# Patient Record
Sex: Male | Born: 1955 | Race: White | Hispanic: No | Marital: Single | Smoking: Current every day smoker
Health system: Southern US, Community
[De-identification: ages and names within clinical notes are randomized; demographics above are authoritative.]

## PROBLEM LIST (undated history)

## (undated) DIAGNOSIS — M1990 Unspecified osteoarthritis, unspecified site: Secondary | ICD-10-CM

## (undated) DIAGNOSIS — J439 Emphysema, unspecified: Secondary | ICD-10-CM

## (undated) DIAGNOSIS — E785 Hyperlipidemia, unspecified: Secondary | ICD-10-CM

## (undated) DIAGNOSIS — Z982 Presence of cerebrospinal fluid drainage device: Secondary | ICD-10-CM

## (undated) DIAGNOSIS — N184 Chronic kidney disease, stage 4 (severe): Secondary | ICD-10-CM

## (undated) DIAGNOSIS — G40909 Epilepsy, unspecified, not intractable, without status epilepticus: Secondary | ICD-10-CM

## (undated) DIAGNOSIS — R251 Tremor, unspecified: Secondary | ICD-10-CM

## (undated) DIAGNOSIS — G039 Meningitis, unspecified: Secondary | ICD-10-CM

## (undated) DIAGNOSIS — N289 Disorder of kidney and ureter, unspecified: Secondary | ICD-10-CM

## (undated) DIAGNOSIS — M48061 Spinal stenosis, lumbar region without neurogenic claudication: Secondary | ICD-10-CM

## (undated) DIAGNOSIS — R569 Unspecified convulsions: Secondary | ICD-10-CM

## (undated) DIAGNOSIS — E119 Type 2 diabetes mellitus without complications: Secondary | ICD-10-CM

## (undated) DIAGNOSIS — F0280 Dementia in other diseases classified elsewhere without behavioral disturbance: Secondary | ICD-10-CM

## (undated) DIAGNOSIS — I10 Essential (primary) hypertension: Secondary | ICD-10-CM

## (undated) HISTORY — DX: Unspecified convulsions: R56.9

## (undated) HISTORY — PX: KNEE SURGERY: SHX244

## (undated) HISTORY — DX: Unspecified osteoarthritis, unspecified site: M19.90

## (undated) HISTORY — PX: CSF SHUNT: SHX92

## (undated) HISTORY — DX: Disorder of kidney and ureter, unspecified: N28.9

---

## 2003-08-03 ENCOUNTER — Other Ambulatory Visit: Payer: Self-pay

## 2003-08-16 ENCOUNTER — Inpatient Hospital Stay (HOSPITAL_COMMUNITY): Admission: EM | Admit: 2003-08-16 | Discharge: 2003-08-18 | Payer: Self-pay | Admitting: Psychiatry

## 2005-01-18 ENCOUNTER — Emergency Department: Payer: Self-pay | Admitting: Unknown Physician Specialty

## 2005-01-18 IMAGING — CR DG LUMBAR SPINE 2-3V
1 series · 3 of 3 positions shown · non-contrast
Comparison: none

REASON FOR EXAM: Status post injury
COMMENTS:

[Series 1: view not recorded · 0.17mm/px · 3 of 3 slices shown]
[im 1/3]
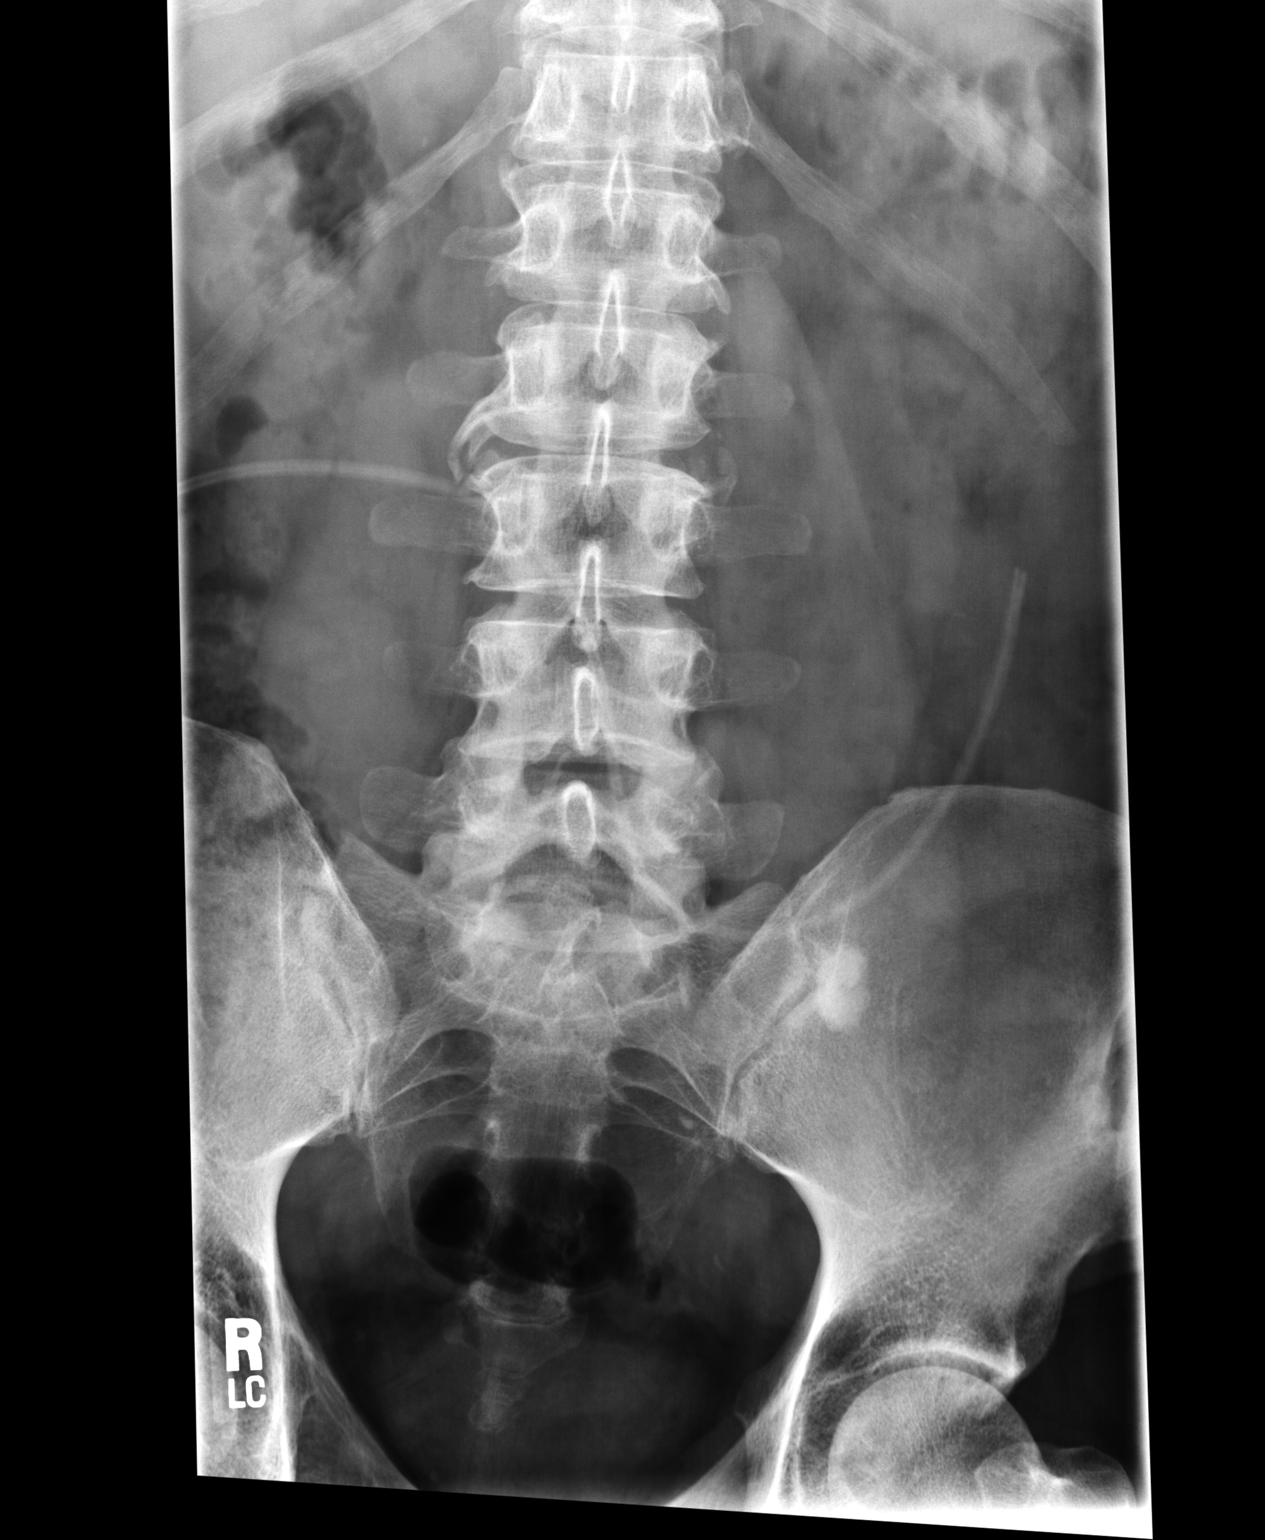
[im 2/3]
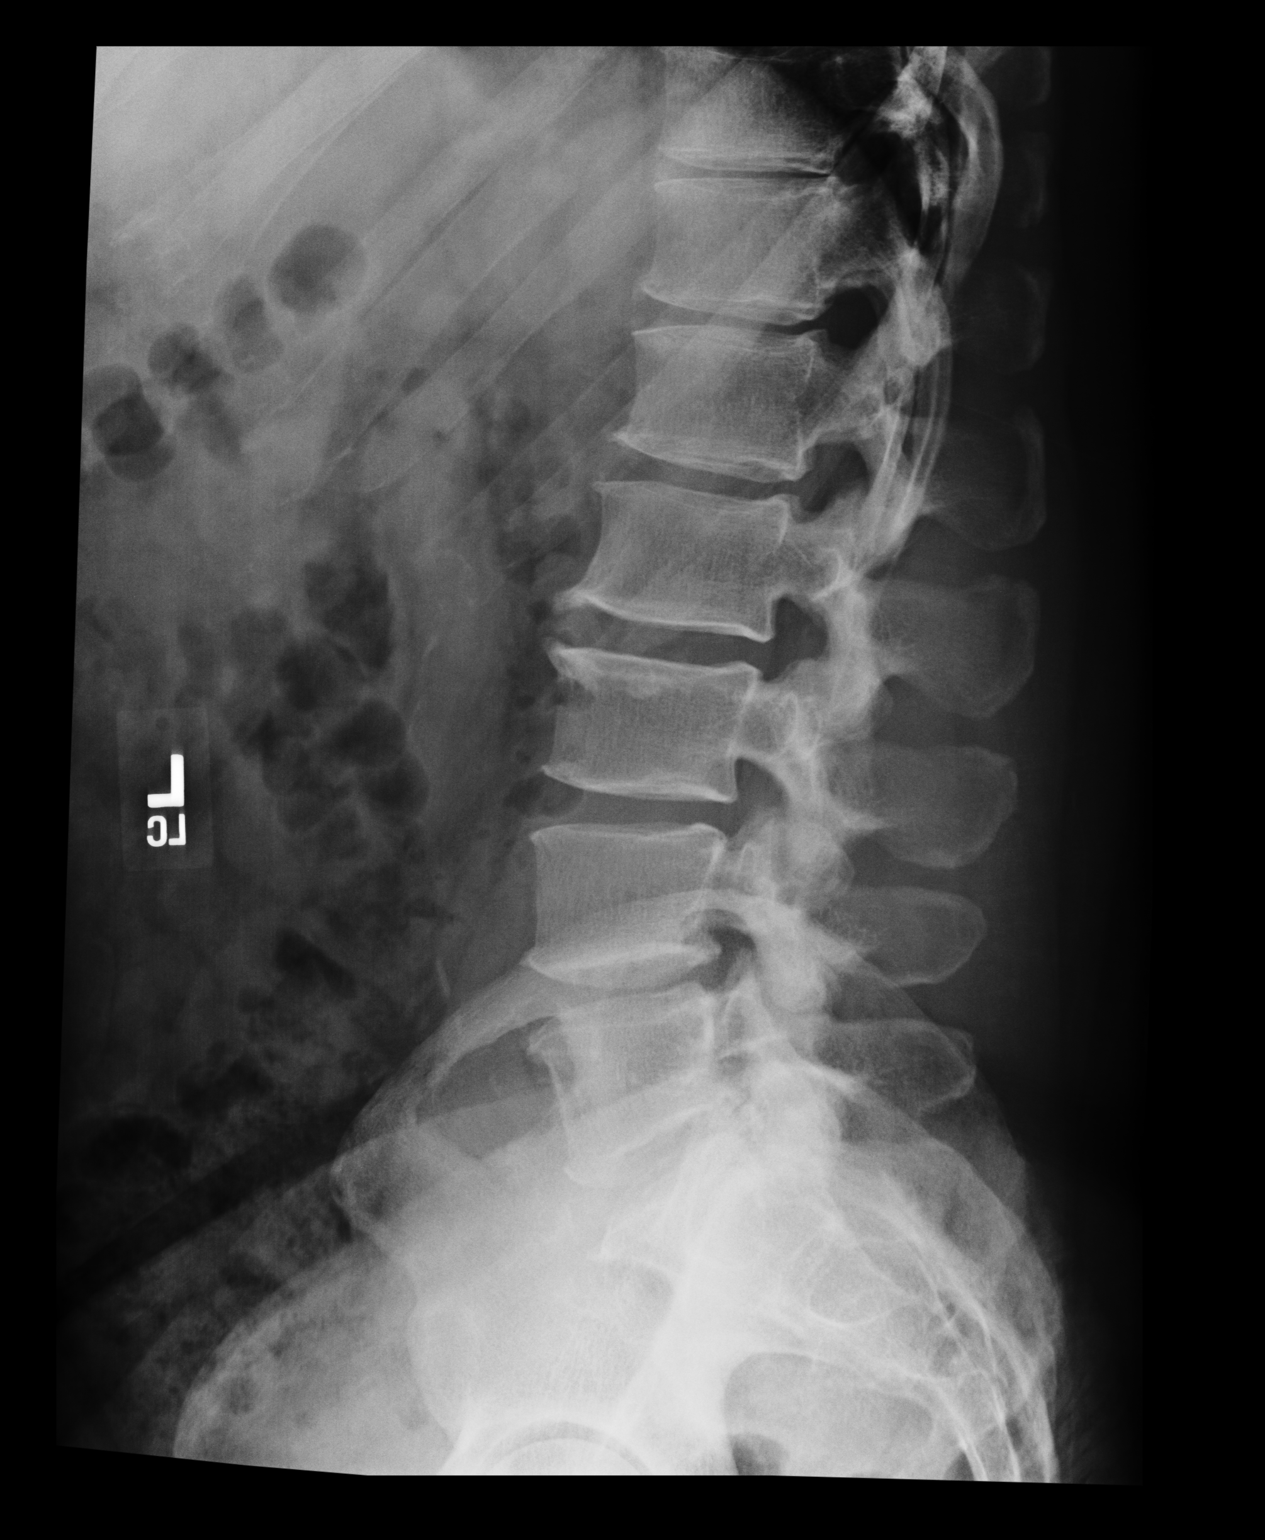
[im 3/3]
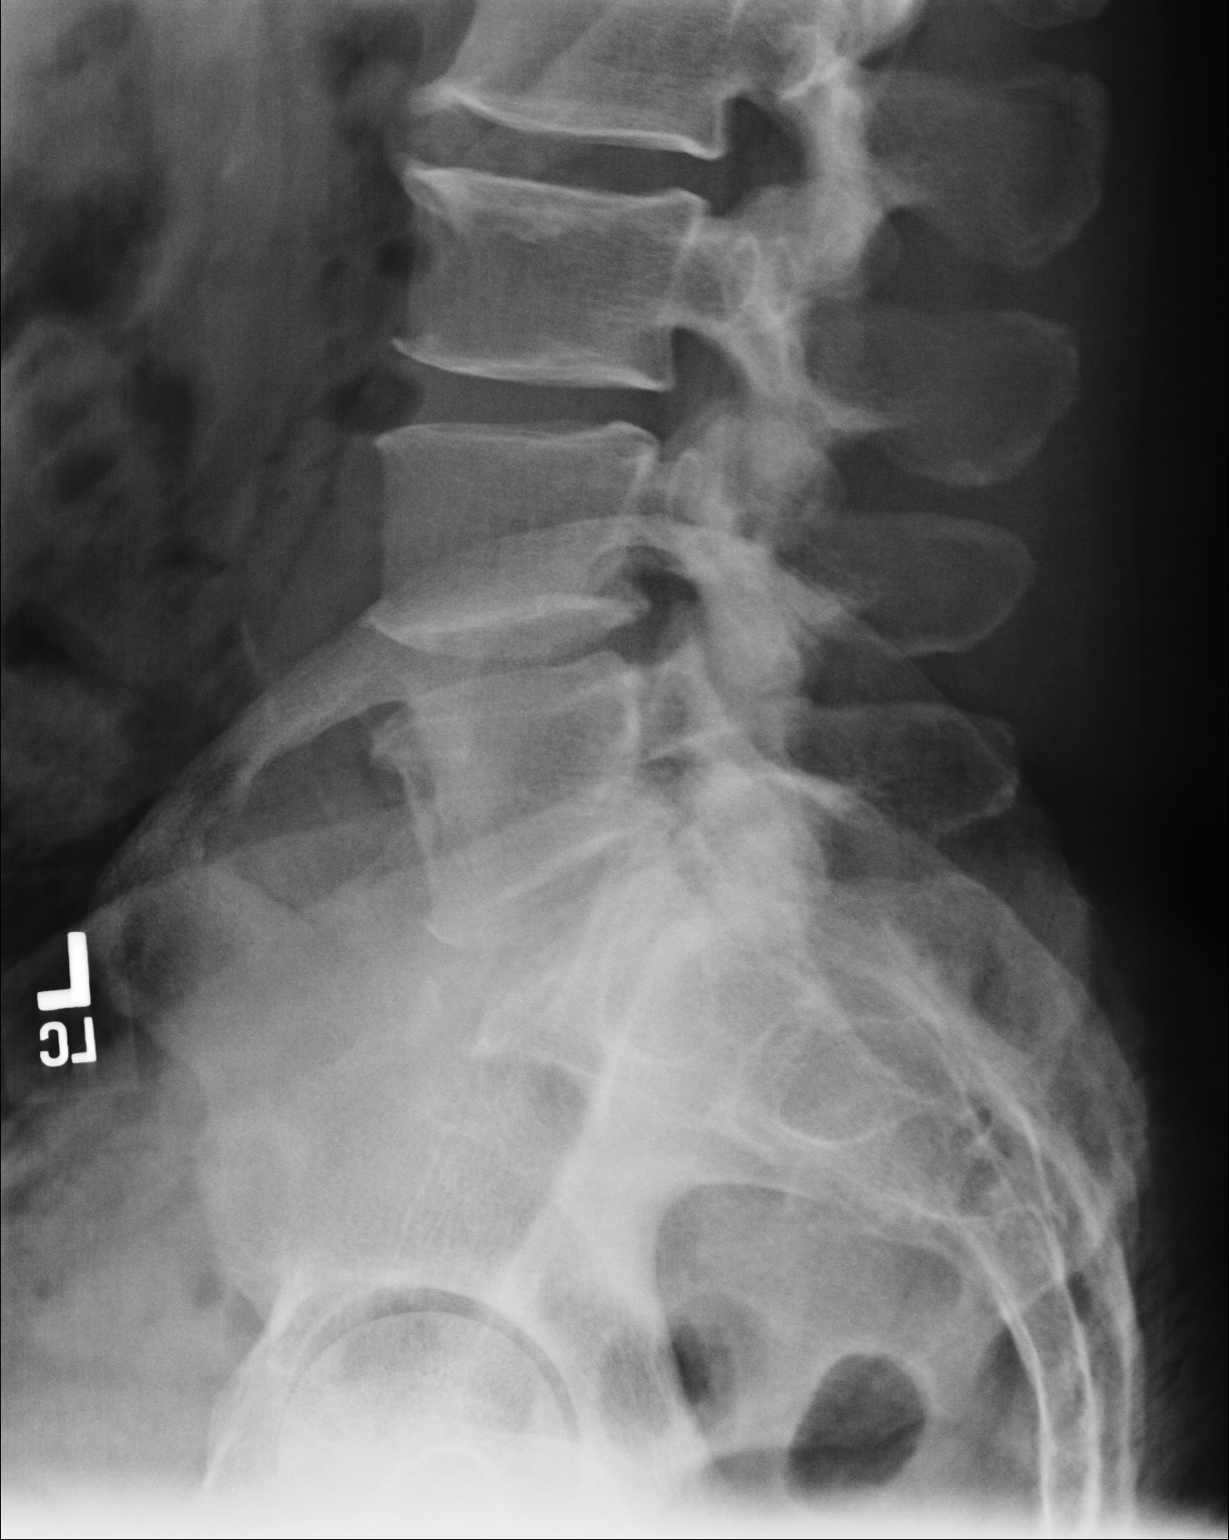

[3 of 3 positions shown; findings below may reference images not displayed]

PROCEDURE:     DXR - DXR LUMBAR SPINE AP AND LATERAL  - [DATE]  [DATE]

RESULT:     The lumbar vertebral bodies are preserved in height.  There is
endplate spurring at multiple levels with the largest spur seen laterally on
the RIGHT at the L2-3 level.  The pedicles and transverse processes are
intact.  No high-grade disk space narrowing is seen.  A focal area of
sclerosis medially in the LEFT iliac bone is most compatible with a bone
island.
IMPRESSION: I do not see evidence of an acute fracture or spondylolisthesis or
high-grade disk space narrowing.  There are degenerative changes present.
Further evaluation with MRI may be of value.

## 2006-07-06 ENCOUNTER — Emergency Department: Payer: Self-pay | Admitting: Emergency Medicine

## 2006-07-08 ENCOUNTER — Emergency Department: Payer: Self-pay | Admitting: Emergency Medicine

## 2006-07-08 IMAGING — CR DG CHEST 2V
1 series · 2 of 2 positions shown · non-contrast
Comparison: none

REASON FOR EXAM: Shortness of breath , cough and wheezes
COMMENTS:

[Series 1: view not recorded · 0.17mm/px · 2 of 2 slices shown]
[im 1/2]
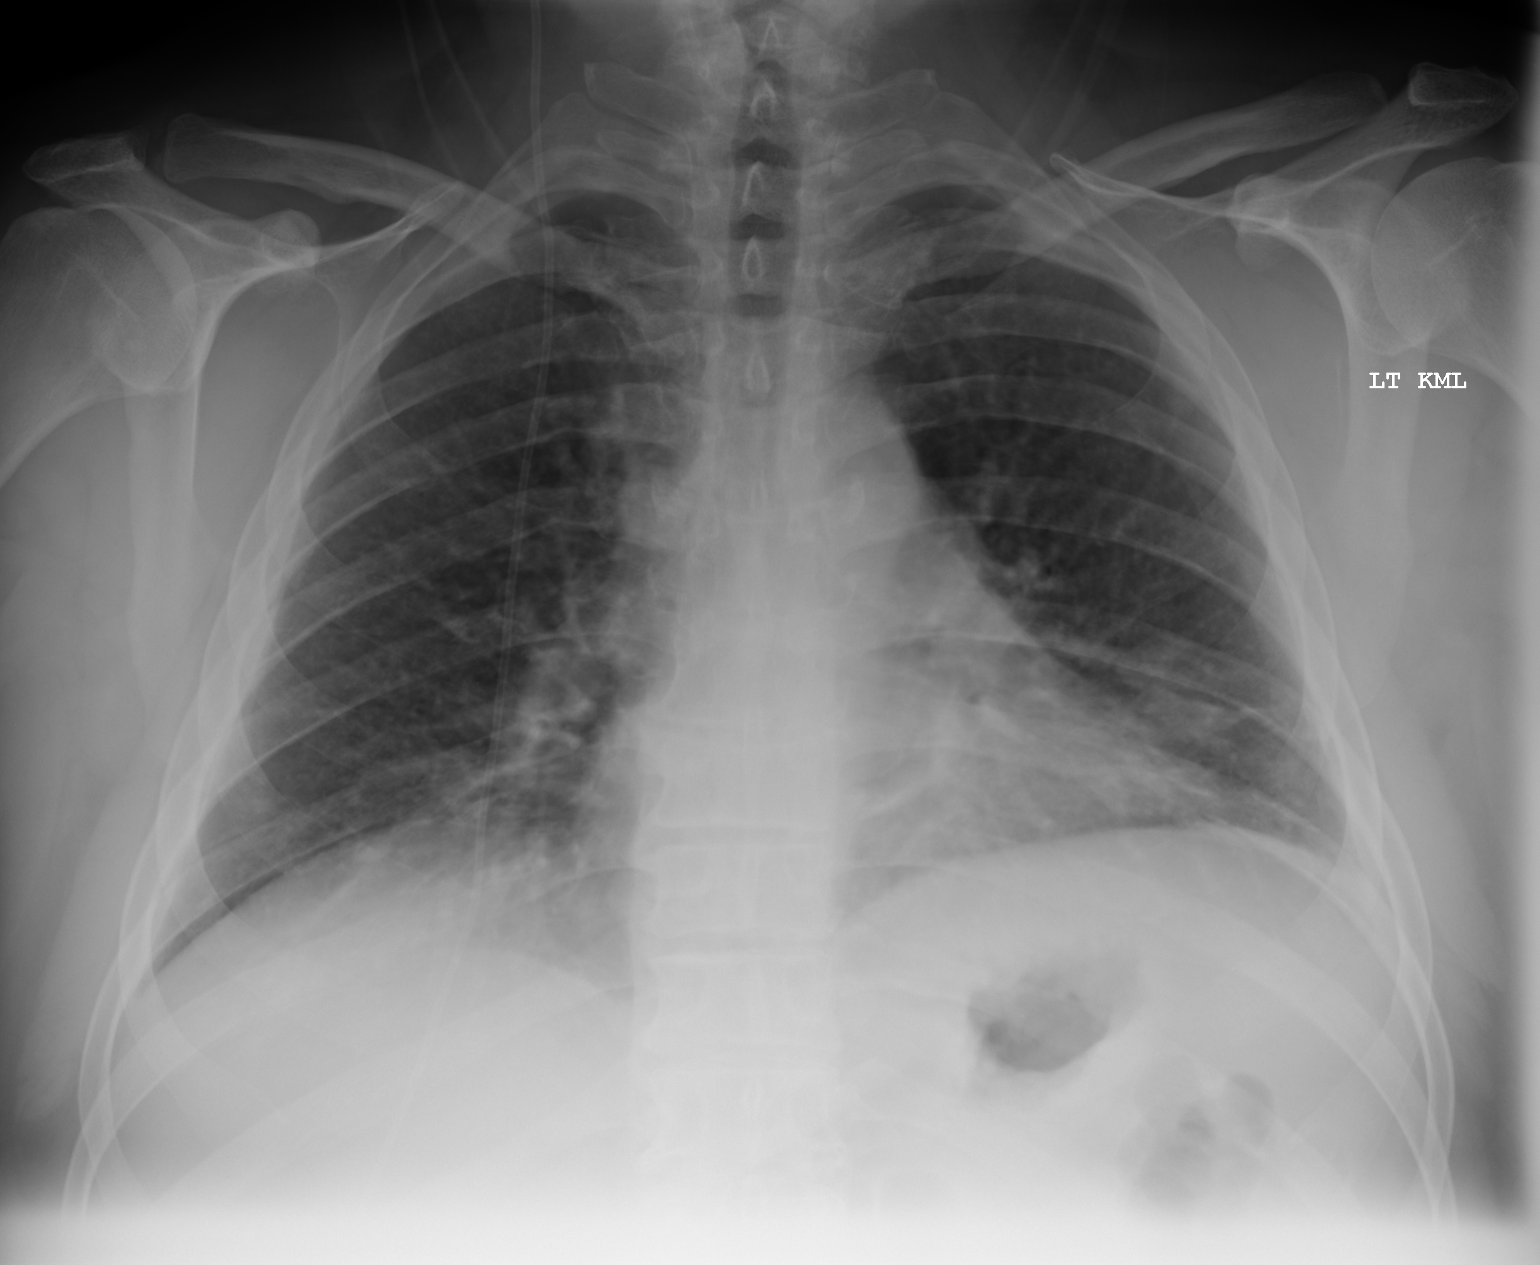
[im 2/2]
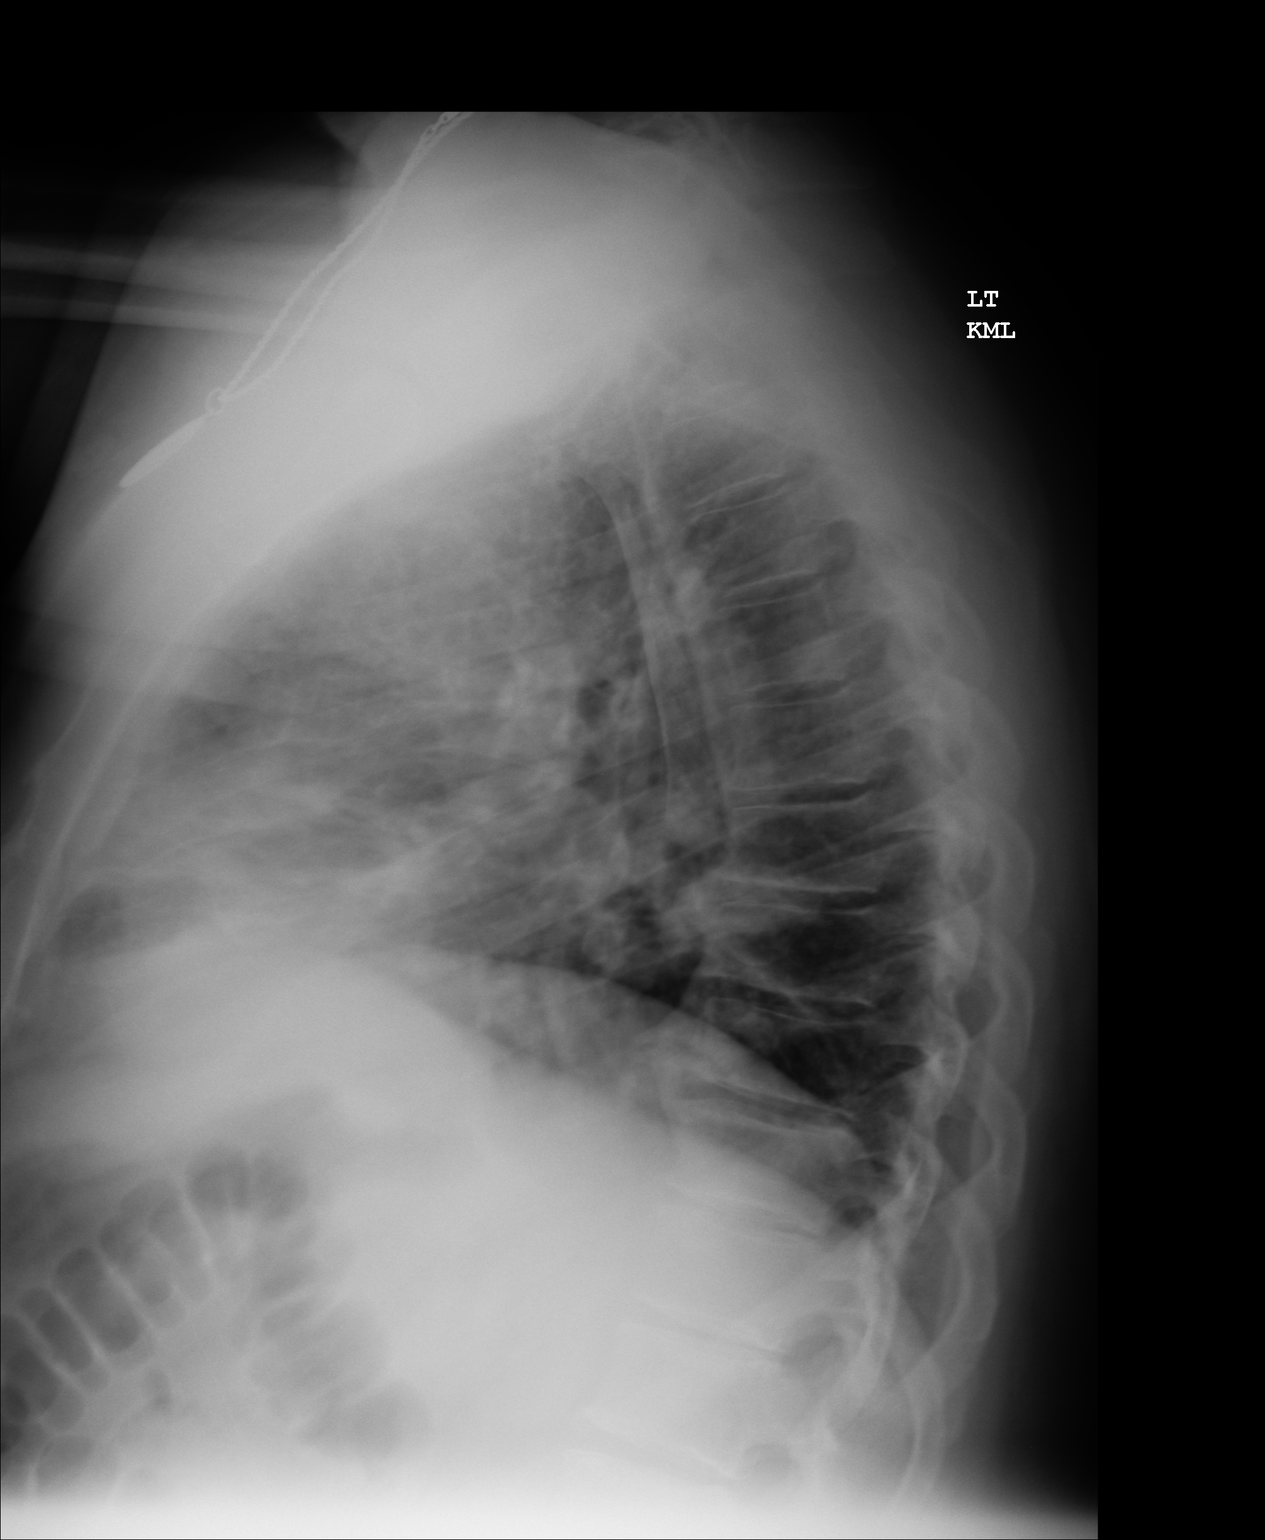

[2 of 2 positions shown; findings below may reference images not displayed]

PROCEDURE:     DXR - DXR CHEST PA (OR AP) AND LATERAL  - [DATE] [DATE]

RESULT:        The lungs are adequately inflated.  There are increased
perihilar lung markings and basilar lung markings bilaterally.  There is a
tube present consistent with ventriculoperitoneal shunt on the RIGHT.  The
cardiopericardial silhouette is not enlarged.  The pulmonary vascularity is
mildly prominent centrally.
IMPRESSION: There are findings which likely reflect atelectasis and/or developing
infiltrate at the lung bases and in the perihilar regions.  Follow-up films
are recommended to assure that there is response to therapy and that no
further infiltrates develop.

## 2008-05-13 ENCOUNTER — Ambulatory Visit: Payer: Self-pay | Admitting: Family Medicine

## 2008-05-20 ENCOUNTER — Ambulatory Visit: Payer: Self-pay | Admitting: Unknown Physician Specialty

## 2008-05-24 ENCOUNTER — Inpatient Hospital Stay: Payer: Self-pay | Admitting: Unknown Physician Specialty

## 2008-05-25 IMAGING — XA DG CHEST 1V
1 series · 1 of 1 positions shown · non-contrast
Comparison: none

REASON FOR EXAM: PICC line placement
COMMENTS:

PROCEDURE:     VAS - CHEST FRONTAL SINGLE VIEW  - [DATE] [DATE]
RESULT:     AP chest post PICC line placement is performed.
The PICC line is noted with its tip over the superior vena cava.

[Series 1: run · 1 of 1 slices shown]
[im 1/1]
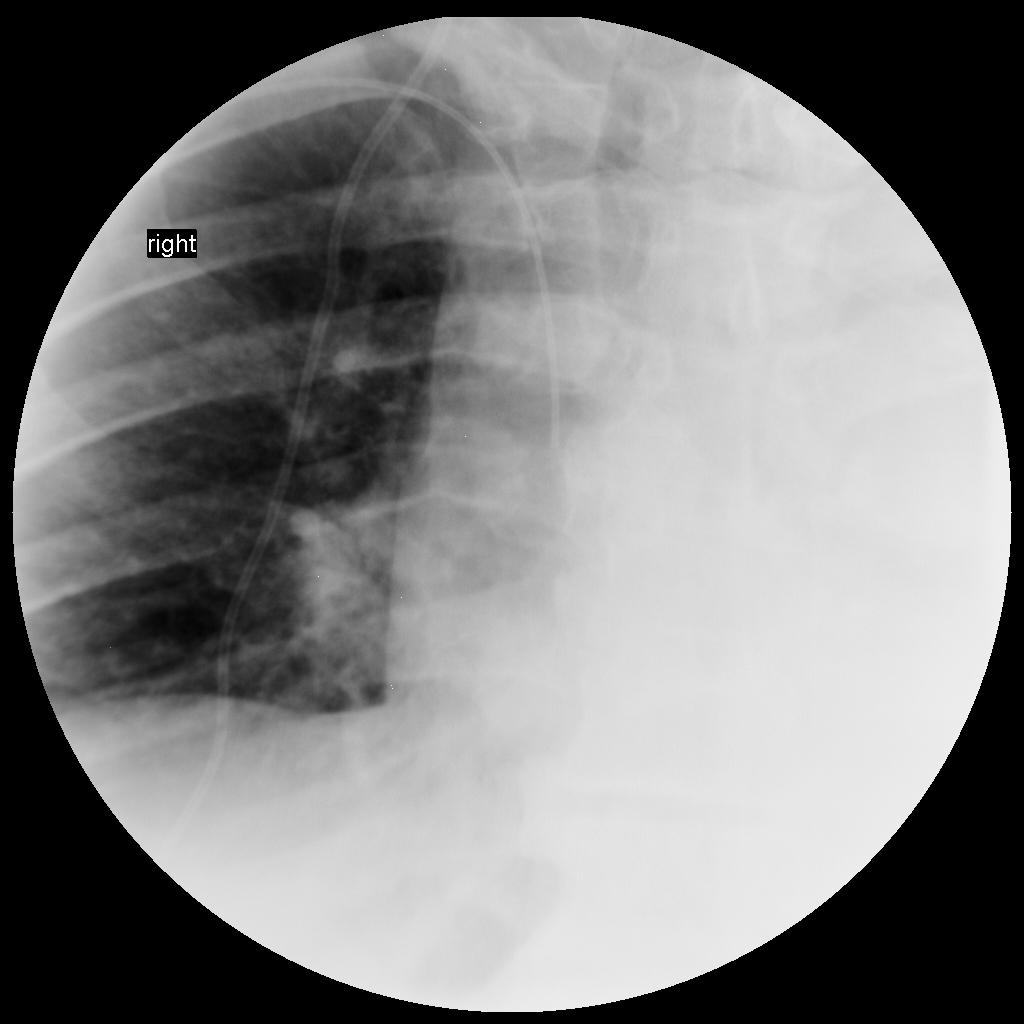

[1 of 1 positions shown; findings below may reference images not displayed]

IMPRESSION: Good anatomic positioning of PICC line.

## 2008-05-28 ENCOUNTER — Emergency Department: Payer: Self-pay | Admitting: Emergency Medicine

## 2008-06-05 ENCOUNTER — Ambulatory Visit: Payer: Self-pay | Admitting: Unknown Physician Specialty

## 2008-06-13 ENCOUNTER — Ambulatory Visit: Payer: Self-pay | Admitting: Unknown Physician Specialty

## 2008-06-20 ENCOUNTER — Ambulatory Visit: Payer: Self-pay | Admitting: Specialist

## 2008-06-24 ENCOUNTER — Emergency Department: Payer: Self-pay | Admitting: Emergency Medicine

## 2008-06-27 ENCOUNTER — Ambulatory Visit: Payer: Self-pay | Admitting: Unknown Physician Specialty

## 2012-02-07 ENCOUNTER — Encounter: Payer: Self-pay | Admitting: Nurse Practitioner

## 2012-02-07 ENCOUNTER — Encounter: Payer: Self-pay | Admitting: Cardiothoracic Surgery

## 2012-10-06 ENCOUNTER — Emergency Department: Payer: Self-pay | Admitting: Emergency Medicine

## 2012-10-06 LAB — CBC
HGB: 15.5 g/dL (ref 13.0–18.0)
MCHC: 33.2 g/dL (ref 32.0–36.0)
RBC: 5.28 10*6/uL (ref 4.40–5.90)
RDW: 13.5 % (ref 11.5–14.5)
WBC: 13.5 10*3/uL — ABNORMAL HIGH (ref 3.8–10.6)

## 2012-10-06 LAB — COMPREHENSIVE METABOLIC PANEL
Albumin: 4 g/dL (ref 3.4–5.0)
Anion Gap: 9 (ref 7–16)
Calcium, Total: 9.4 mg/dL (ref 8.5–10.1)
Chloride: 105 mmol/L (ref 98–107)
Co2: 23 mmol/L (ref 21–32)
Creatinine: 0.83 mg/dL (ref 0.60–1.30)
Osmolality: 287 (ref 275–301)
SGPT (ALT): 25 U/L (ref 12–78)
Sodium: 137 mmol/L (ref 136–145)
Total Protein: 8.4 g/dL — ABNORMAL HIGH (ref 6.4–8.2)

## 2012-10-06 LAB — VALPROIC ACID LEVEL: Valproic Acid: <3 ug/mL — ABNORMAL LOW

## 2012-10-06 IMAGING — CT CT HEAD WITHOUT CONTRAST
2 series · 15 of 30 positions shown, 19 images · non-contrast
Comparison: none

REASON FOR EXAM: headache, large abscess above VP shunt
COMMENTS:

[Series 2: soft tissue · axial · 0.46mm/px · z∈[+396,+532]mm · 13 of 33 slices shown, 17 images]
[im 3/33  brain]
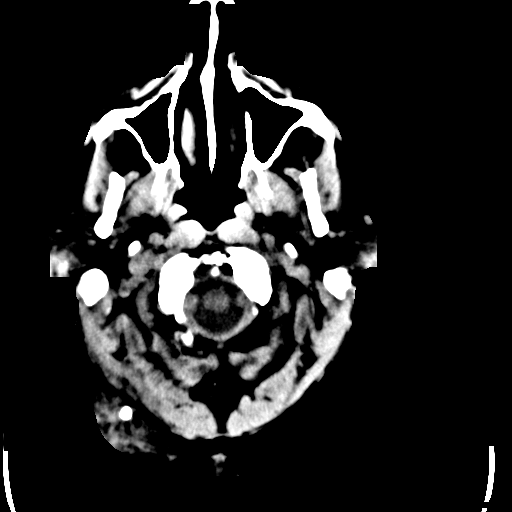
[im 3/33  bone]
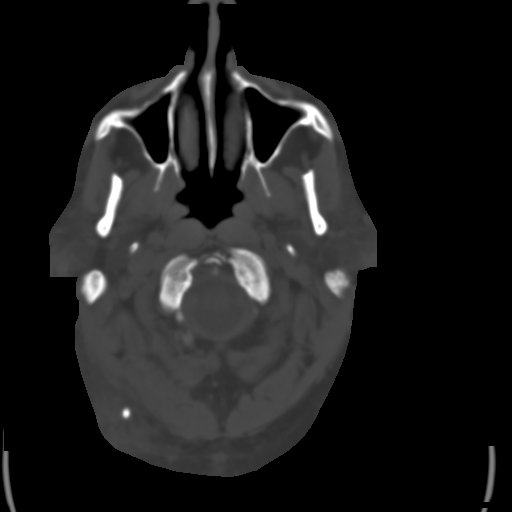
[im 5/33  brain]
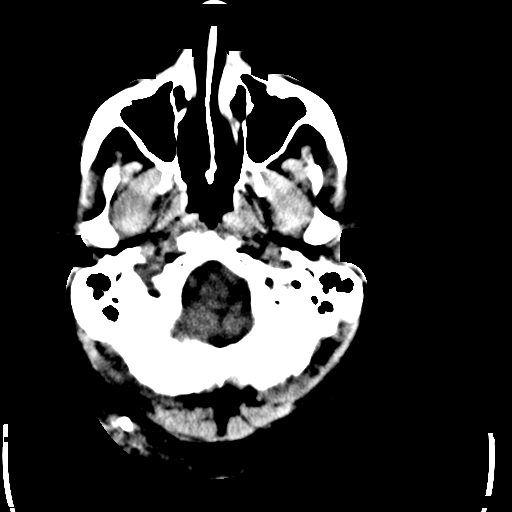
[im 7/33  brain]
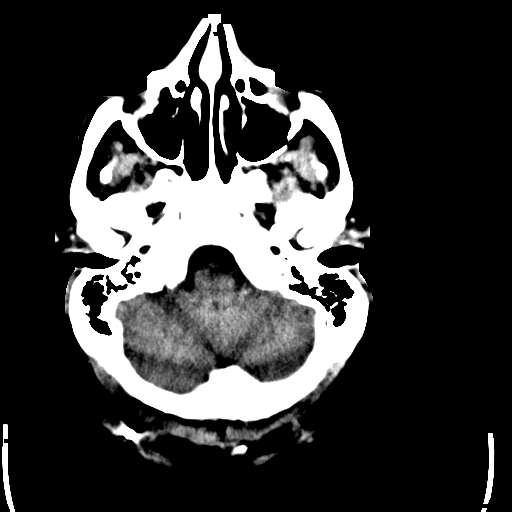
[im 10/33  brain]
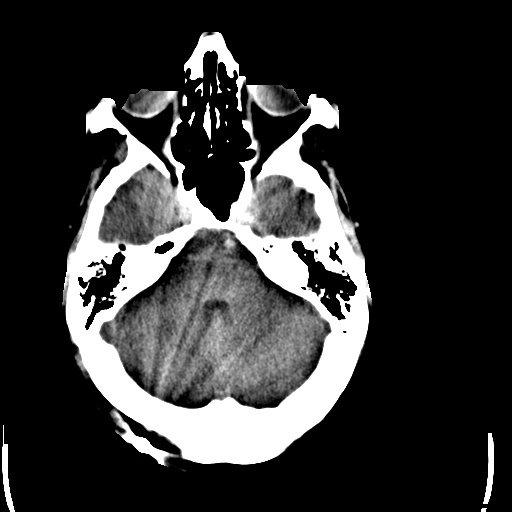
[im 12/33  brain]
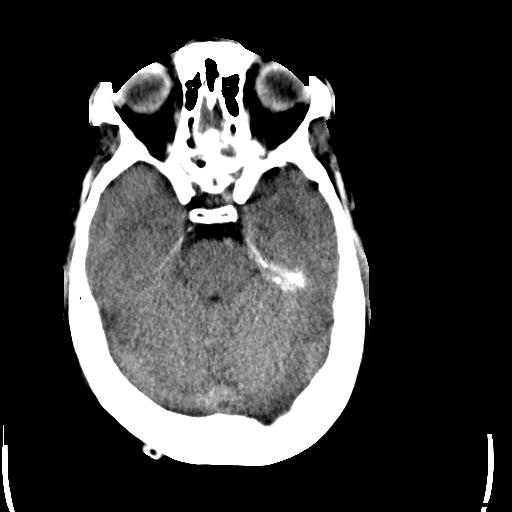
[im 12/33  bone]
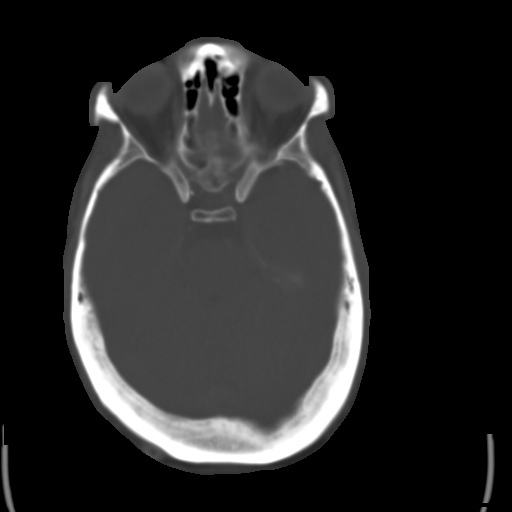
[im 14/33  brain]
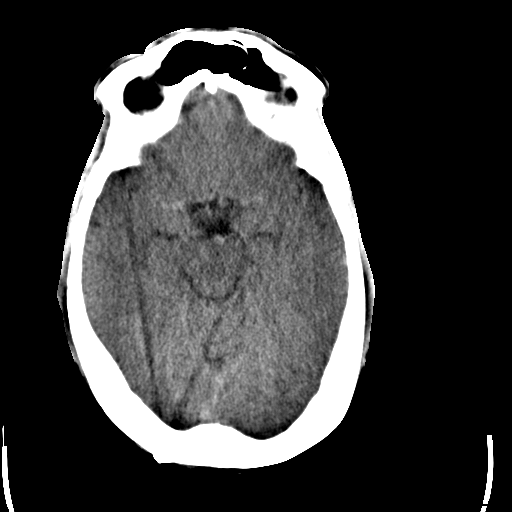
[im 17/33  brain]
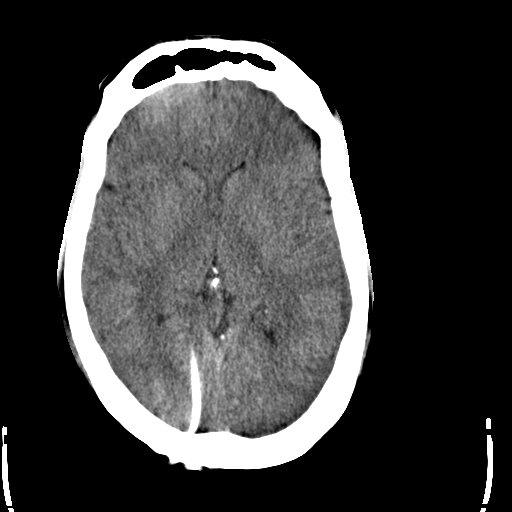
[im 19/33  brain]
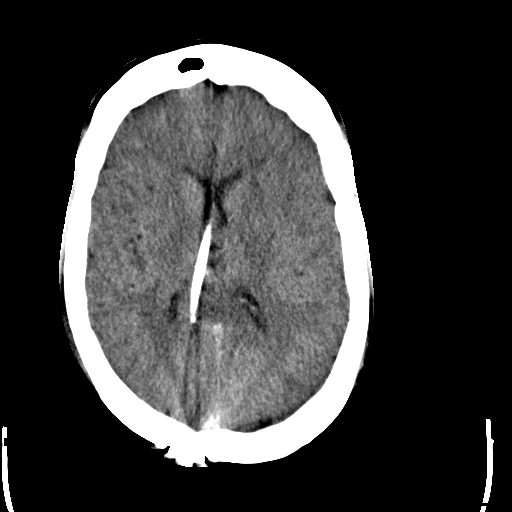
[im 21/33  brain]
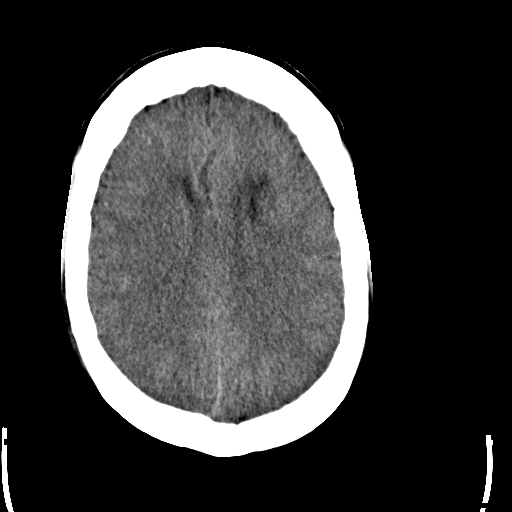
[im 21/33  bone]
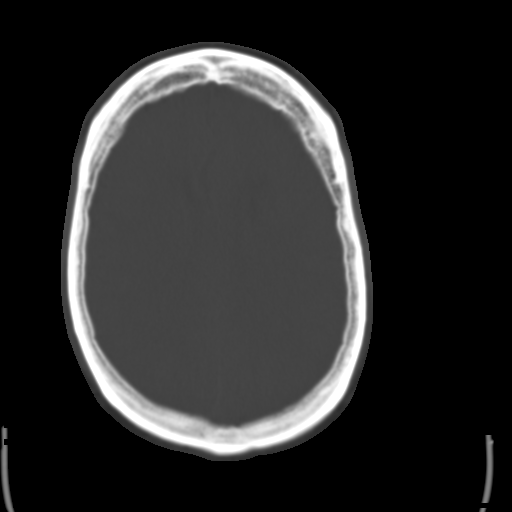
[im 23/33  brain]
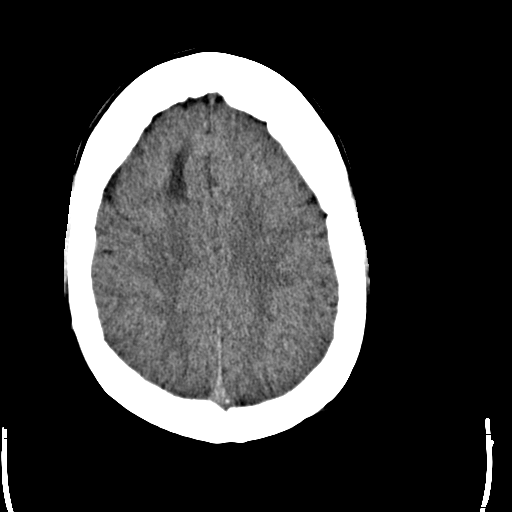
[im 26/33  brain]
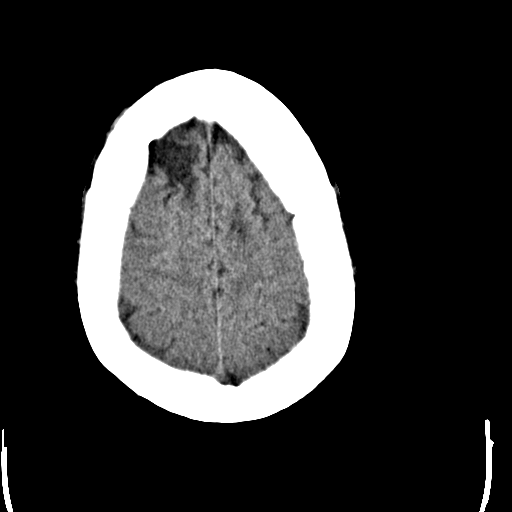
[im 28/33  brain]
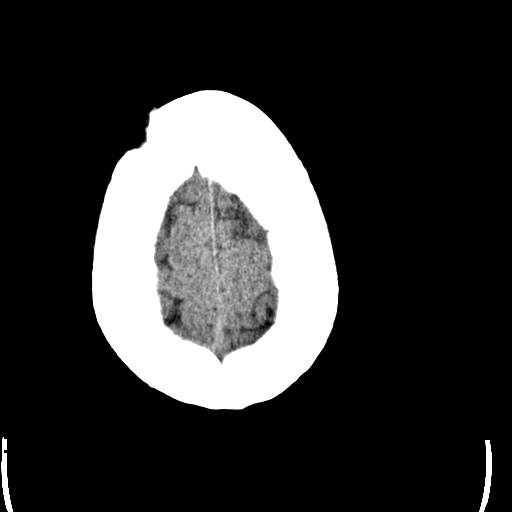
[im 30/33  brain]
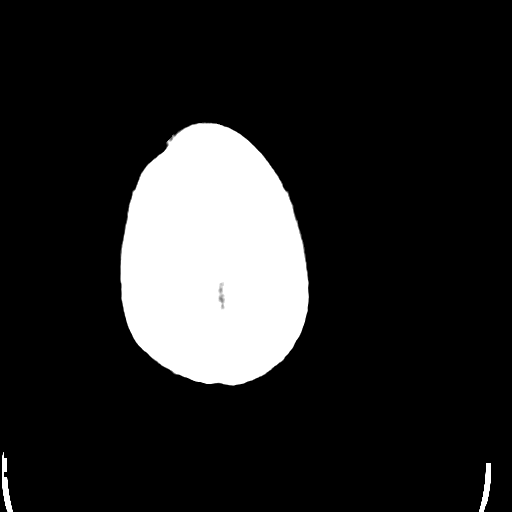
[im 30/33  bone]
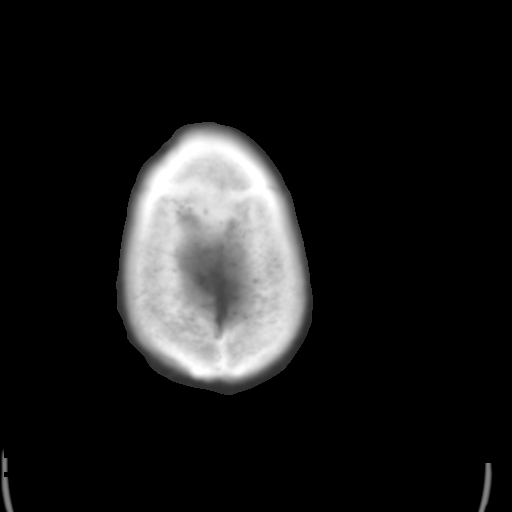

[Series 3: bone · axial · 0.46mm/px · z∈[+396,+422]mm · 2 of 35 slices shown]
[im 3/35  bone]
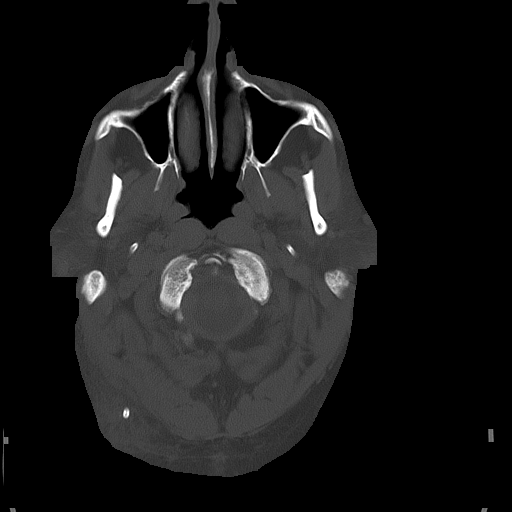
[im 8/35  bone]
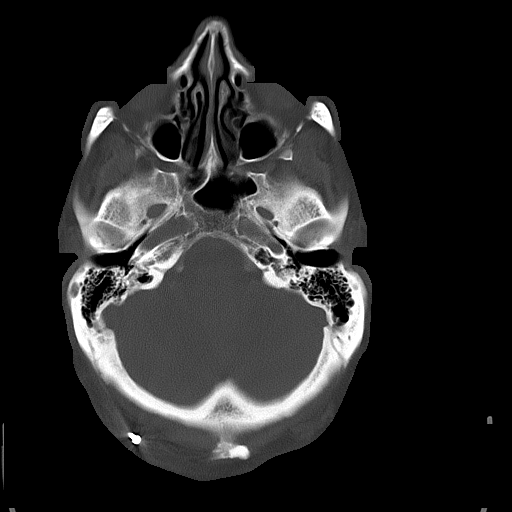

[15 of 30 positions shown; findings below may reference images not displayed]

PROCEDURE:     CT  - CT HEAD WITHOUT CONTRAST  - [DATE]  [DATE]

RESULT:     Axial noncontrast CT scanning was performed through the brain
with reconstructions at 5 mm intervals and slice thicknesses.

There is a ventricular shunt tube present over the right posterior parietal
and occipital region. There is inflammatory change in the surrounding soft
tissues. No discrete abscess is demonstrated. There is no evidence of
hydrocephalus. There is no intracranial mass effect. There is hypodensity in
both frontal lobes. There is no shift of the midline. There is no acute
intracranial hemorrhage. The cerebellum is normal in appearance. At bone
window settings the observed portions of the paranasal sinuses and mastoid
air cells are clear.
IMPRESSION: 1. A ventricular shunt tube is present with the tip terminating in the
midline in the frontal horns of the lateral ventricles. There is increased
subcutaneous fat density surrounding the shunt tube which may reflect
inflammatory change. There is no discrete abscess demonstrated.
2. There is no evidence of hydrocephalus.
3. There is decreased density in the deep white matter of both cerebral
hemispheres consistent with chronic small vessel ischemic change.

[REDACTED]

## 2012-10-10 LAB — WOUND CULTURE

## 2012-10-12 LAB — CULTURE, BLOOD (SINGLE)

## 2013-12-10 DIAGNOSIS — G40909 Epilepsy, unspecified, not intractable, without status epilepticus: Secondary | ICD-10-CM | POA: Insufficient documentation

## 2014-01-28 ENCOUNTER — Ambulatory Visit: Payer: Medicare Other | Admitting: Podiatry

## 2014-02-04 ENCOUNTER — Ambulatory Visit: Payer: Medicare Other | Admitting: Podiatry

## 2014-02-15 ENCOUNTER — Ambulatory Visit: Payer: Medicare Other | Admitting: Podiatry

## 2015-04-05 ENCOUNTER — Emergency Department: Payer: Medicare Other

## 2015-04-05 ENCOUNTER — Emergency Department
Admission: EM | Admit: 2015-04-05 | Discharge: 2015-04-05 | Disposition: A | Discharge status: Home / Self Care | Payer: Medicare Other | Attending: Student | Admitting: Student

## 2015-04-05 ENCOUNTER — Other Ambulatory Visit: Payer: Self-pay

## 2015-04-05 ENCOUNTER — Encounter: Payer: Self-pay | Admitting: General Practice

## 2015-04-05 DIAGNOSIS — M7541 Impingement syndrome of right shoulder: Secondary | ICD-10-CM | POA: Insufficient documentation

## 2015-04-05 DIAGNOSIS — I10 Essential (primary) hypertension: Secondary | ICD-10-CM | POA: Diagnosis not present

## 2015-04-05 DIAGNOSIS — M7591 Shoulder lesion, unspecified, right shoulder: Secondary | ICD-10-CM | POA: Insufficient documentation

## 2015-04-05 DIAGNOSIS — Z720 Tobacco use: Secondary | ICD-10-CM | POA: Insufficient documentation

## 2015-04-05 DIAGNOSIS — E119 Type 2 diabetes mellitus without complications: Secondary | ICD-10-CM | POA: Insufficient documentation

## 2015-04-05 DIAGNOSIS — Z79899 Other long term (current) drug therapy: Secondary | ICD-10-CM | POA: Insufficient documentation

## 2015-04-05 DIAGNOSIS — Z794 Long term (current) use of insulin: Secondary | ICD-10-CM | POA: Diagnosis not present

## 2015-04-05 DIAGNOSIS — M25511 Pain in right shoulder: Secondary | ICD-10-CM | POA: Diagnosis present

## 2015-04-05 DIAGNOSIS — M7581 Other shoulder lesions, right shoulder: Secondary | ICD-10-CM

## 2015-04-05 HISTORY — DX: Unspecified osteoarthritis, unspecified site: M19.90

## 2015-04-05 HISTORY — DX: Essential (primary) hypertension: I10

## 2015-04-05 HISTORY — DX: Type 2 diabetes mellitus without complications: E11.9

## 2015-04-05 IMAGING — CR DG SHOULDER 2+V*R*
1 series · 3 of 3 positions shown · non-contrast
Comparison: None.

CLINICAL DATA: Acute onset right shoulder pain.  No known injury.

EXAM:
RIGHT SHOULDER - 2+ VIEW

[Series 1: dg shoulder right · 0.14mm/px · 3 of 3 slices shown]
[im 1/3]
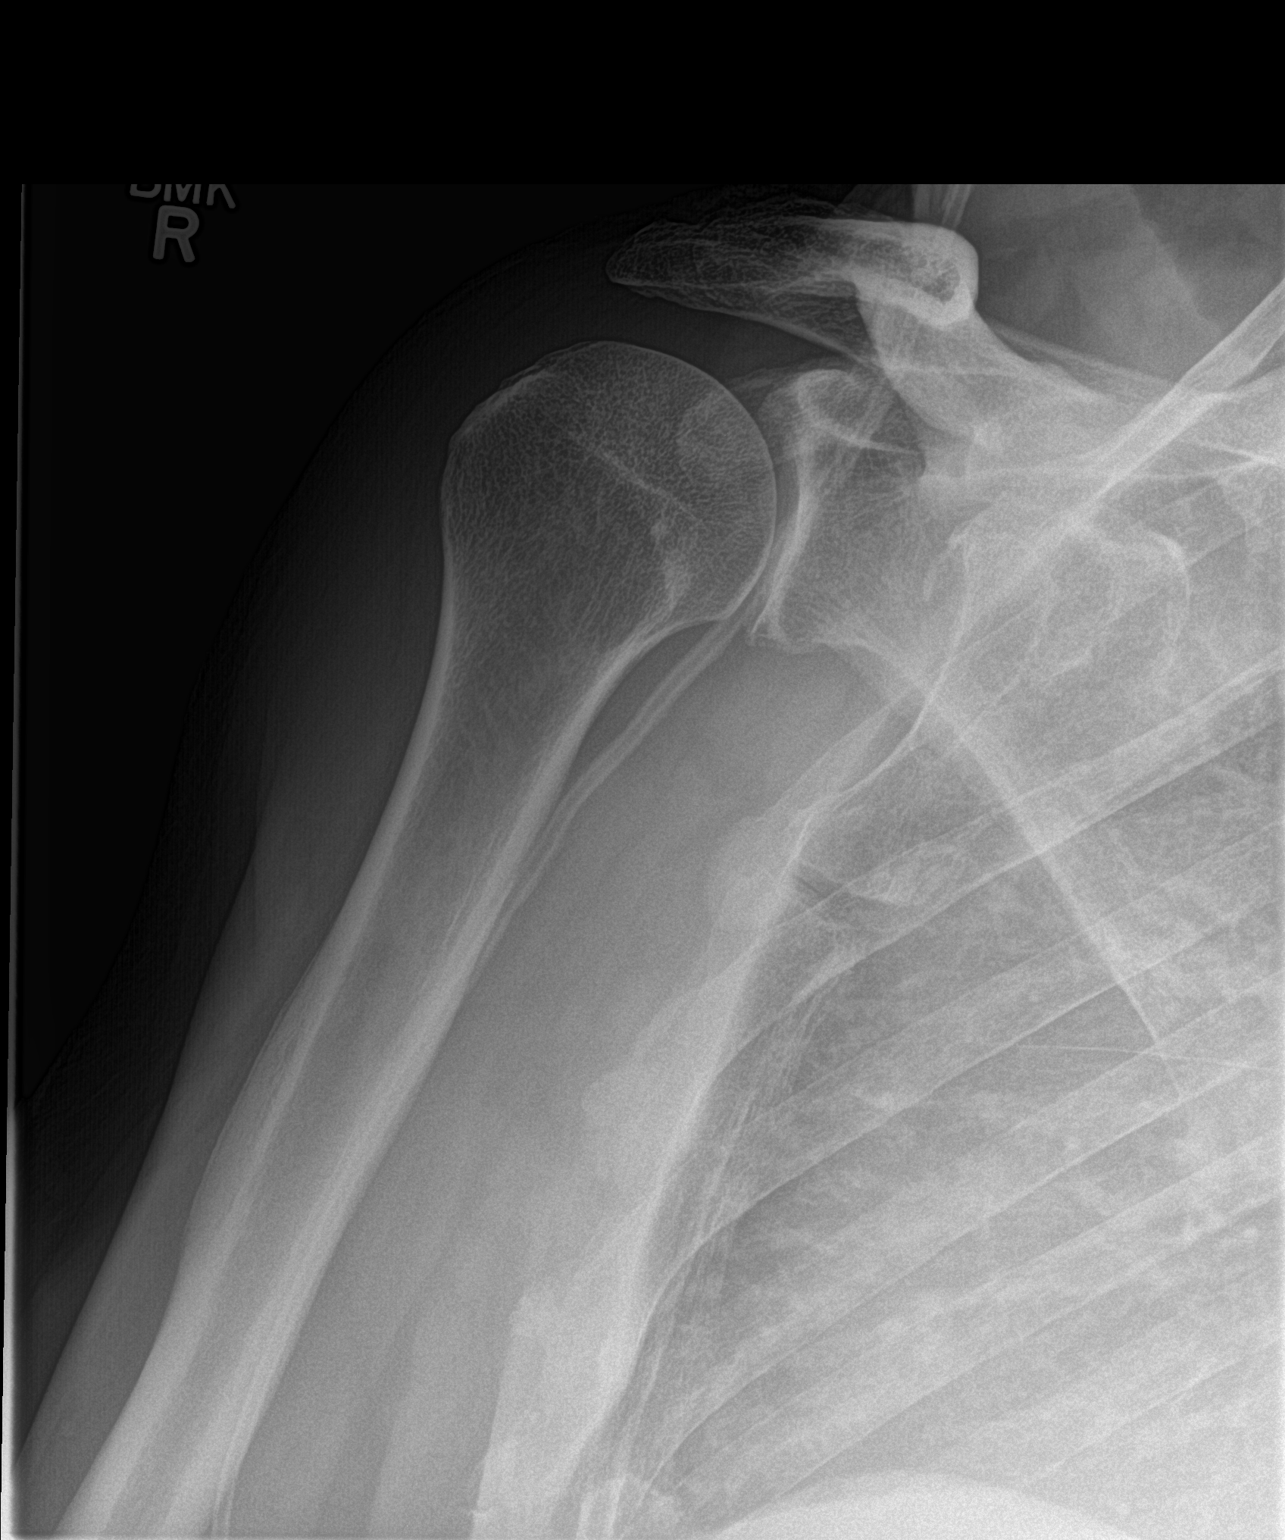
[im 2/3]
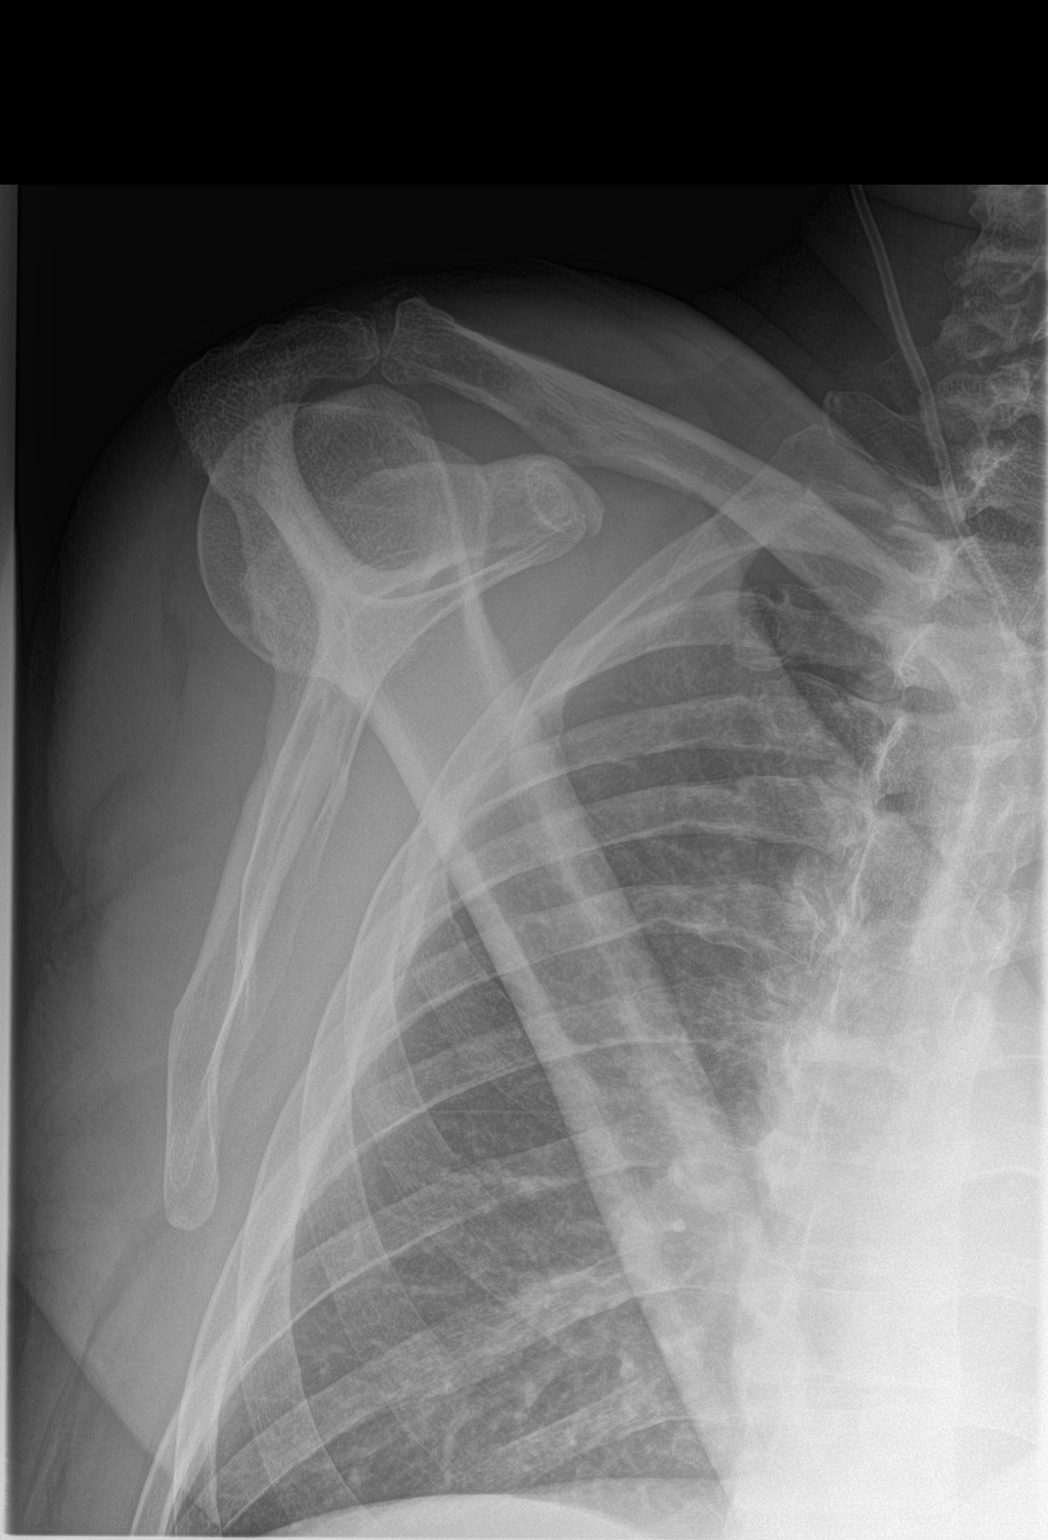
[im 3/3]
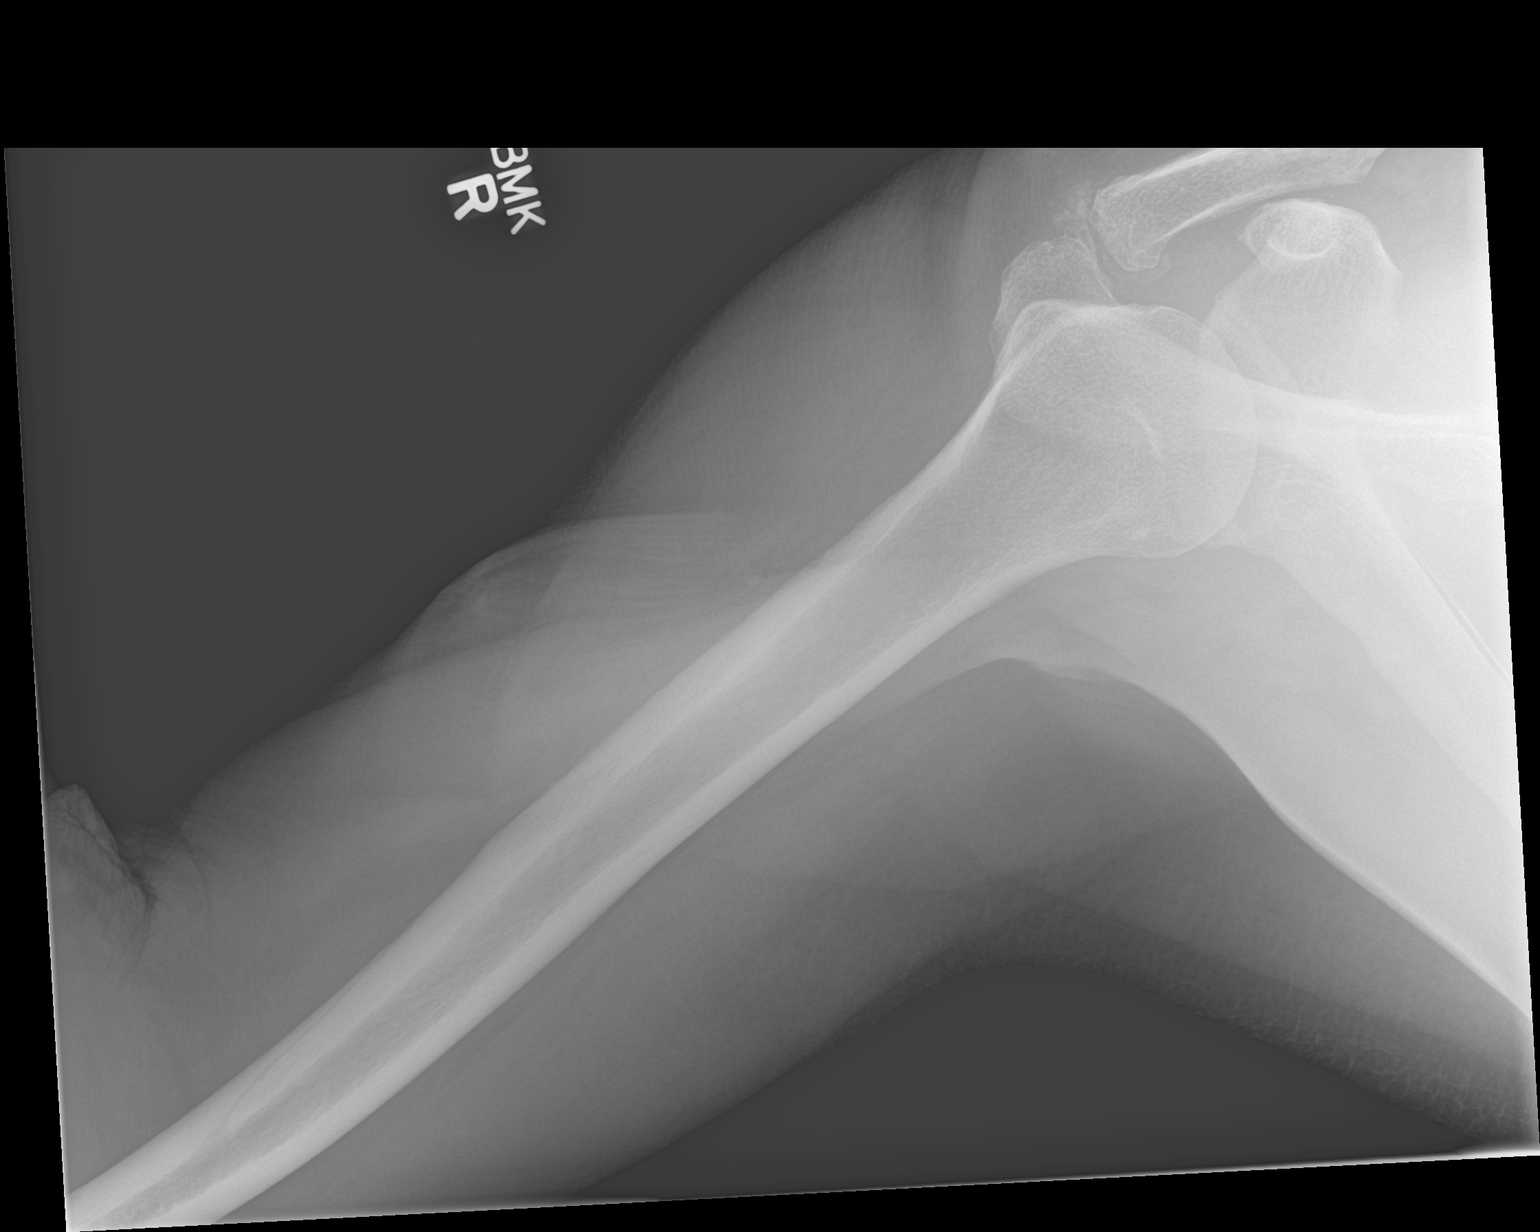

[3 of 3 positions shown; findings below may reference images not displayed]

FINDINGS: There is no evidence of fracture or dislocation. There is no
evidence of arthropathy or other focal bone abnormality. Soft
tissues are unremarkable.

Mild degenerative spurring and chondrocalcinosis seen involving the
acromioclavicular joint. No other significant bone abnormality
identified.
IMPRESSION: No acute findings.

Mild acromioclavicular degenerative spurring and chondrocalcinosis.

## 2015-04-05 MED ORDER — TRAMADOL HCL 50 MG PO TABS: 50.0000 mg | ORAL_TABLET | Freq: Two times a day (BID) | ORAL | Status: DC

## 2015-04-05 MED ORDER — KETOROLAC TROMETHAMINE 10 MG PO TABS
10.0000 mg | ORAL_TABLET | Freq: Three times a day (TID) | ORAL | Status: DC
Start: 2015-04-05 — End: 2018-05-13

## 2015-04-05 MED ORDER — TRAMADOL HCL 50 MG PO TABS
ORAL_TABLET | ORAL | Status: AC
  Administered 2015-04-05: 100 mg via ORAL
  Filled 2015-04-05: qty 2

## 2015-04-05 MED ORDER — TRAMADOL HCL 50 MG PO TABS
100.0000 mg | ORAL_TABLET | Freq: Once | ORAL | Status: AC
  Administered 2015-04-05: 100 mg via ORAL

## 2015-04-05 NOTE — ED Notes (Signed)
Patient transported to X-ray 

## 2015-04-05 NOTE — Discharge Instructions (Signed)
Rotator Cuff Tendinitis  Rotator cuff tendinitis is inflammation of the tough, cord-like bands that connect muscle to bone (tendons) in your rotator cuff. Your rotator cuff is the collection of all the muscles and tendons that connect your arm to your shoulder. Your rotator cuff holds the head of your upper arm bone (humerus) in the cup (fossa) of your shoulder blade (scapula). CAUSES Rotator cuff tendinitis is usually caused by overusing the joint involved.  SIGNS AND SYMPTOMS  Deep ache in the shoulder also felt on the outside upper arm over the shoulder muscle.  Point tenderness over the area that is injured.  Pain comes on gradually and becomes worse with lifting the arm to the side (abduction) or turning it inward (internal rotation).  May lead to a chronic tear: When a rotator cuff tendon becomes inflamed, it runs the risk of losing its blood supply, causing some tendon fibers to die. This increases the risk that the tendon can fray and partially or completely tear. DIAGNOSIS Rotator cuff tendinitis is diagnosed by taking a medical history, performing a physical exam, and reviewing results of imaging exams. The medical history is useful to help determine the type of rotator cuff injury. The physical exam will include looking at the injured shoulder, feeling the injured area, and watching you do range-of-motion exercises. X-ray exams are typically done to rule out other causes of shoulder pain, such as fractures. MRI is the imaging exam usually used for significant shoulder injuries. Sometimes a dye study called CT arthrogram is done, but it is not as widely used as MRI. In some institutions, special ultrasound tests may also be used to aid in the diagnosis. TREATMENT  Less Severe Cases  Use of a sling to rest the shoulder for a short period of time. Prolonged use of the sling can cause stiffness, weakness, and loss of motion of the shoulder joint.  Anti-inflammatory medicines, such as  ibuprofen or naproxen sodium, may be prescribed. More Severe Cases  Physical therapy.  Use of steroid injections into the shoulder joint.  Surgery. HOME CARE INSTRUCTIONS   Use a sling or splint until the pain decreases. Prolonged use of the sling can cause stiffness, weakness, and loss of motion of the shoulder joint.  Apply ice to the injured area:  Put ice in a plastic bag.  Place a towel between your skin and the bag.  Leave the ice on for 20 minutes, 2-3 times a day.  Try to avoid use other than gentle range of motion while your shoulder is painful. Use the shoulder and exercise only as directed by your health care provider. Stop exercises or range of motion if pain or discomfort increases, unless directed otherwise by your health care provider.  Only take over-the-counter or prescription medicines for pain, discomfort, or fever as directed by your health care provider.  If you were given a shoulder sling and straps (immobilizer), do not remove it except as directed, or until you see a health care provider for a follow-up exam. If you need to remove it, move your arm as little as possible or as directed.  You may want to sleep on several pillows at night to lessen swelling and pain. SEEK IMMEDIATE MEDICAL CARE IF:   Your shoulder pain increases or new pain develops in your arm, hand, or fingers and is not relieved with medicines.  You have new, unexplained symptoms, especially increased numbness in the hands or loss of strength.  You develop any worsening of the problems  that brought you in for care.  Your arm, hand, or fingers are numb or tingling.  Your arm, hand, or fingers are swollen, painful, or turn white or blue. MAKE SURE YOU:  Understand these instructions.  Will watch your condition.  Will get help right away if you are not doing well or get worse. Document Released: 12/07/2003 Document Revised: 07/07/2013 Document Reviewed: 04/28/2013 [facility] Patient  Information 2015 [place], [place]. This information is not intended to replace advice given to you by your health care provider. Make sure you discuss any questions you have with your health care provider.  Take the prescription meds as directed.  Apply ice and wear the sling for comfort. Follow-up with Dr. Sabra Heck for further care and management.

## 2015-04-05 NOTE — ED Provider Notes (Signed)
[facility] Emergency Department Provider Note ____________________________________________  Time seen: 1900  I have reviewed the triage vital signs and the nursing notes.  HISTORY  Chief Complaint  Shoulder Pain  HPI Albert Figueroa is a 59 y.o. male , right-hand dominant, who reports to the ED with complaint of right shoulder pain that radiates into the right neck. The patient claims that he woke up this morning with this pain, after having difficulty sleeping in the early morning hours due to the pain. He denies any recent trauma, injury, but notes remote history of the same. He describes anterior shoulder pain with referral of the lateral right neck, stating it is difficult for him to raise his arm, but is adamant about there being no acute injury. He denies any distal paresthesias, grip changes, or swelling in the hand.  Past Medical History  Diagnosis Date  . Hypertension   . Diabetes mellitus without complication   . Arthritis    There are no active problems to display for this patient.  Past Surgical History  Procedure Laterality Date  . Knee surgery      Current Outpatient Rx  Name  Route  Sig  Dispense  Refill  . atorvastatin (LIPITOR) 40 MG tablet   Oral   Take 40 mg by mouth daily.         . divalproex (DEPAKOTE ER) 500 MG 24 hr tablet   Oral   Take 500 mg by mouth daily.         . insulin detemir (LEVEMIR) 100 UNIT/ML injection   Subcutaneous   Inject into the skin at bedtime.         Marland Kitchen losartan (COZAAR) 100 MG tablet   Oral   Take 100 mg by mouth daily.         . metFORMIN (GLUCOPHAGE) 500 MG tablet   Oral   Take by mouth 2 (two) times daily with a meal.         . ketorolac (TORADOL) 10 MG tablet   Oral   Take 1 tablet (10 mg total) by mouth every 8 (eight) hours.   15 tablet   0   . traMADol (ULTRAM) 50 MG tablet   Oral   Take 1 tablet (50 mg total) by mouth 2 (two) times daily.   12 tablet   0     Allergies Review of patient's allergies indicates no known allergies.  No family history on file.  Social History History  Substance Use Topics  . Smoking status: Current Every Day Smoker -- 2.00 packs/day    Types: Cigarettes  . Smokeless tobacco: Not on file  . Alcohol Use: No   Review of Systems  Constitutional: Negative for fever. Eyes: Negative for visual changes. ENT: Negative for sore throat. Cardiovascular: Negative for chest pain. Respiratory: Negative for shortness of breath. Gastrointestinal: Negative for abdominal pain, vomiting and diarrhea. Genitourinary: Negative for dysuria. Musculoskeletal: Negative for back pain. Shoulder pain as above Skin: Negative for rash. Neurological: Negative for headaches, focal weakness or numbness. ____________________________________________  PHYSICAL EXAM:  VITAL SIGNS: ED Triage Vitals  Enc Vitals Group     BP 04/05/15 1805 148/98 mmHg     Pulse Rate 04/05/15 1805 76     Resp 04/05/15 1805 19     Temp 04/05/15 1805 98.4 F (36.9 C)     Temp Source 04/05/15 1805 Oral     SpO2 04/05/15 1805 96 %     Weight 04/05/15 1805 235  lb (106.595 kg)     Height 04/05/15 1805 5\' 11"  (1.803 m)     Head Cir --      Peak Flow --      Pain Score 04/05/15 1805 10     Pain Loc --      Pain Edu? --      Excl. in [place]? --    Constitutional: Alert and oriented. Well appearing and in no distress. Eyes: Conjunctivae are normal. PERRL. Normal extraocular movements. ENT   Head: Normocephalic and atraumatic.   Nose: No congestion/rhinnorhea.   Mouth/Throat: Mucous membranes are moist.   Neck: Supple. No thyromegaly. Hematological/Lymphatic/Immunilogical: No cervical lymphadenopathy. Cardiovascular: Normal distal pulses.  Respiratory: Normal respiratory effort.  Musculoskeletal: Right shoulder without deformity, sulcus sign, or drop-off.  Decreased shoulder extension actively. Negative empty can sign, but with difficulty  holding extension due to pain. Decreased rotator cuff strength testing of infraspinatus tendon. Positive impingement. Normal grip strength. Neurologic:  Normal gait without ataxia. Normal speech and language. No gross focal neurologic deficits are appreciated. CN II-XII grossly intact. Normal UE DTRs bilaterally.  Skin:  Skin is warm, dry and intact. No rash noted. Psychiatric: Mood and affect are normal. Patient exhibits appropriate insight and judgment. ____________________________________________  ED ECG REPORT   Date: 04/07/2015  EKG Time: 1812  Rate: 87  Rhythm: normal sinus rhythm,  normal EKG, normal sinus rhythm, there are no previous tracings available for comparison  Axis: normal  Intervals:none  ST&T Change: none  Narrative Interpretation: NSR possible left atrial enlargement ____________________________________________   RADIOLOGY Right Shoulder IMPRESSION: No acute findings. 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  I, [place] Zaragoza, Dannielle Karvonen, personally viewed and evaluated these images as part of my medical decision making.  ____________________________________________  PROCEDURES  Ultram 100 mg PO Arm sling ____________________________________________  INITIAL IMPRESSION / ASSESSMENT AND PLAN / ED COURSE  Acute right shoulder disability without injury. Consider biceps tendinitis and rotator cuff tendinitis. Difficult exam due to pain response. Radiology results to patient and wife. Referral to [place] for further evaluation.  ____________________________________________  FINAL CLINICAL IMPRESSION(S) / ED DIAGNOSES  Final diagnoses:  Rotator cuff tendinitis, right  Shoulder pain, acute, right     Melvenia Needles, PA-C 04/07/15 1606  Joanne Gavel, MD 04/10/15 0013

## 2015-04-05 NOTE — ED Notes (Signed)
Pt. Arrived to ed from home with reports of experiencing right shoulder pain that radiates to right side of neck. Pt reports pain woke him up this morning. Pt denies chest pain and SOB. Pt states hx of pervious injury to Rt. Shoulder years ago. Denies recent injury. Pt able to move shoulder and arm, pt states "it hurts when i move it".

## 2015-04-06 DIAGNOSIS — M7520 Bicipital tendinitis, unspecified shoulder: Secondary | ICD-10-CM | POA: Insufficient documentation

## 2015-04-12 ENCOUNTER — Other Ambulatory Visit: Payer: Self-pay

## 2015-04-12 MED ORDER — ATORVASTATIN CALCIUM 40 MG PO TABS: 40.0000 mg | ORAL_TABLET | Freq: Every day | ORAL | Status: DC

## 2015-04-12 NOTE — Telephone Encounter (Signed)
Refilled Medication 30 day supply with no refills until patient schedules appt, informed patient and sent refill to [facility].

## 2015-04-19 ENCOUNTER — Other Ambulatory Visit: Payer: Self-pay | Admitting: Family Medicine

## 2015-04-27 ENCOUNTER — Telehealth: Payer: Self-pay

## 2015-04-27 MED ORDER — ATORVASTATIN CALCIUM 40 MG PO TABS: 40.0000 mg | ORAL_TABLET | Freq: Every day | ORAL | Status: DC

## 2015-04-27 NOTE — Telephone Encounter (Signed)
Medication has been refilled.

## 2015-06-13 DIAGNOSIS — M1990 Unspecified osteoarthritis, unspecified site: Secondary | ICD-10-CM | POA: Insufficient documentation

## 2015-06-13 DIAGNOSIS — E1122 Type 2 diabetes mellitus with diabetic chronic kidney disease: Secondary | ICD-10-CM | POA: Insufficient documentation

## 2015-06-13 DIAGNOSIS — E785 Hyperlipidemia, unspecified: Secondary | ICD-10-CM | POA: Insufficient documentation

## 2015-06-13 DIAGNOSIS — E782 Mixed hyperlipidemia: Secondary | ICD-10-CM | POA: Insufficient documentation

## 2015-06-13 DIAGNOSIS — Z720 Tobacco use: Secondary | ICD-10-CM | POA: Insufficient documentation

## 2015-06-13 DIAGNOSIS — E119 Type 2 diabetes mellitus without complications: Secondary | ICD-10-CM | POA: Insufficient documentation

## 2015-06-13 DIAGNOSIS — I10 Essential (primary) hypertension: Secondary | ICD-10-CM | POA: Insufficient documentation

## 2015-11-27 DIAGNOSIS — E669 Obesity, unspecified: Secondary | ICD-10-CM | POA: Insufficient documentation

## 2016-03-11 ENCOUNTER — Other Ambulatory Visit: Payer: Self-pay | Admitting: Family Medicine

## 2016-07-26 ENCOUNTER — Other Ambulatory Visit: Payer: Self-pay | Admitting: Family Medicine

## 2017-10-29 DIAGNOSIS — N184 Chronic kidney disease, stage 4 (severe): Secondary | ICD-10-CM | POA: Insufficient documentation

## 2017-10-29 DIAGNOSIS — N183 Chronic kidney disease, stage 3 (moderate): Secondary | ICD-10-CM

## 2018-04-03 ENCOUNTER — Other Ambulatory Visit: Payer: Self-pay | Admitting: Nephrology

## 2018-04-03 DIAGNOSIS — N183 Chronic kidney disease, stage 3 unspecified: Secondary | ICD-10-CM

## 2018-04-13 ENCOUNTER — Ambulatory Visit: Payer: Medicare Other

## 2018-04-29 ENCOUNTER — Ambulatory Visit
Admission: RE | Admit: 2018-04-29 | Discharge: 2018-04-29 | Disposition: A | Discharge status: Home / Self Care | Payer: Medicare Other | Source: Ambulatory Visit | Attending: Nephrology | Admitting: Nephrology

## 2018-04-29 DIAGNOSIS — N183 Chronic kidney disease, stage 3 unspecified: Secondary | ICD-10-CM

## 2018-04-29 DIAGNOSIS — N281 Cyst of kidney, acquired: Secondary | ICD-10-CM | POA: Diagnosis not present

## 2018-04-29 DIAGNOSIS — K760 Fatty (change of) liver, not elsewhere classified: Secondary | ICD-10-CM | POA: Diagnosis not present

## 2018-05-13 ENCOUNTER — Encounter: Payer: Self-pay | Admitting: Student in an Organized Health Care Education/Training Program

## 2018-05-13 ENCOUNTER — Ambulatory Visit
Admission: RE | Admit: 2018-05-13 | Discharge: 2018-05-13 | Disposition: A | Discharge status: Home / Self Care | Payer: Medicare Other | Source: Ambulatory Visit | Attending: Student in an Organized Health Care Education/Training Program | Admitting: Student in an Organized Health Care Education/Training Program

## 2018-05-13 ENCOUNTER — Other Ambulatory Visit: Payer: Self-pay

## 2018-05-13 ENCOUNTER — Ambulatory Visit
Admission: RE | Admit: 2018-05-13 | Discharge: 2018-05-13 | Disposition: A | Discharge status: Home / Self Care | Payer: Medicare Other | Source: Ambulatory Visit | Attending: Nephrology | Admitting: Nephrology

## 2018-05-13 ENCOUNTER — Ambulatory Visit
Payer: Medicare Other | Attending: Student in an Organized Health Care Education/Training Program | Admitting: Student in an Organized Health Care Education/Training Program

## 2018-05-13 VITALS — BP 138/88 | HR 70 | Temp 98.2°F | Resp 16 | Ht 71.0 in | Wt 242.0 lb

## 2018-05-13 DIAGNOSIS — Z96651 Presence of right artificial knee joint: Secondary | ICD-10-CM | POA: Insufficient documentation

## 2018-05-13 DIAGNOSIS — I739 Peripheral vascular disease, unspecified: Secondary | ICD-10-CM | POA: Diagnosis not present

## 2018-05-13 DIAGNOSIS — I129 Hypertensive chronic kidney disease with stage 1 through stage 4 chronic kidney disease, or unspecified chronic kidney disease: Secondary | ICD-10-CM | POA: Diagnosis not present

## 2018-05-13 DIAGNOSIS — M5442 Lumbago with sciatica, left side: Secondary | ICD-10-CM | POA: Diagnosis present

## 2018-05-13 DIAGNOSIS — N183 Chronic kidney disease, stage 3 unspecified: Secondary | ICD-10-CM

## 2018-05-13 DIAGNOSIS — M25552 Pain in left hip: Secondary | ICD-10-CM

## 2018-05-13 DIAGNOSIS — M25561 Pain in right knee: Secondary | ICD-10-CM | POA: Diagnosis not present

## 2018-05-13 DIAGNOSIS — E669 Obesity, unspecified: Secondary | ICD-10-CM

## 2018-05-13 DIAGNOSIS — Z982 Presence of cerebrospinal fluid drainage device: Secondary | ICD-10-CM | POA: Diagnosis not present

## 2018-05-13 DIAGNOSIS — G894 Chronic pain syndrome: Secondary | ICD-10-CM | POA: Diagnosis not present

## 2018-05-13 DIAGNOSIS — G8929 Other chronic pain: Secondary | ICD-10-CM

## 2018-05-13 DIAGNOSIS — Z794 Long term (current) use of insulin: Secondary | ICD-10-CM | POA: Insufficient documentation

## 2018-05-13 DIAGNOSIS — I708 Atherosclerosis of other arteries: Secondary | ICD-10-CM | POA: Insufficient documentation

## 2018-05-13 DIAGNOSIS — Z6833 Body mass index (BMI) 33.0-33.9, adult: Secondary | ICD-10-CM | POA: Insufficient documentation

## 2018-05-13 DIAGNOSIS — Z79899 Other long term (current) drug therapy: Secondary | ICD-10-CM | POA: Insufficient documentation

## 2018-05-13 DIAGNOSIS — M25562 Pain in left knee: Secondary | ICD-10-CM | POA: Insufficient documentation

## 2018-05-13 DIAGNOSIS — F17210 Nicotine dependence, cigarettes, uncomplicated: Secondary | ICD-10-CM | POA: Diagnosis not present

## 2018-05-13 DIAGNOSIS — M11262 Other chondrocalcinosis, left knee: Secondary | ICD-10-CM | POA: Diagnosis not present

## 2018-05-13 DIAGNOSIS — E1122 Type 2 diabetes mellitus with diabetic chronic kidney disease: Secondary | ICD-10-CM | POA: Diagnosis not present

## 2018-05-13 DIAGNOSIS — M2480 Other specific joint derangements of unspecified joint, not elsewhere classified: Secondary | ICD-10-CM | POA: Insufficient documentation

## 2018-05-13 DIAGNOSIS — M47896 Other spondylosis, lumbar region: Secondary | ICD-10-CM | POA: Diagnosis not present

## 2018-05-13 IMAGING — CR DG KNEE 1-2V*R*
2 series · 2 of 2 positions shown · non-contrast
Comparison: Comparison made to prior MRI report of [DATE].

CLINICAL DATA: Bilateral knee pain. No known injury. Prior knee
surgery.

EXAM:
RIGHT KNEE - 1-2 VIEW

[knee lat]
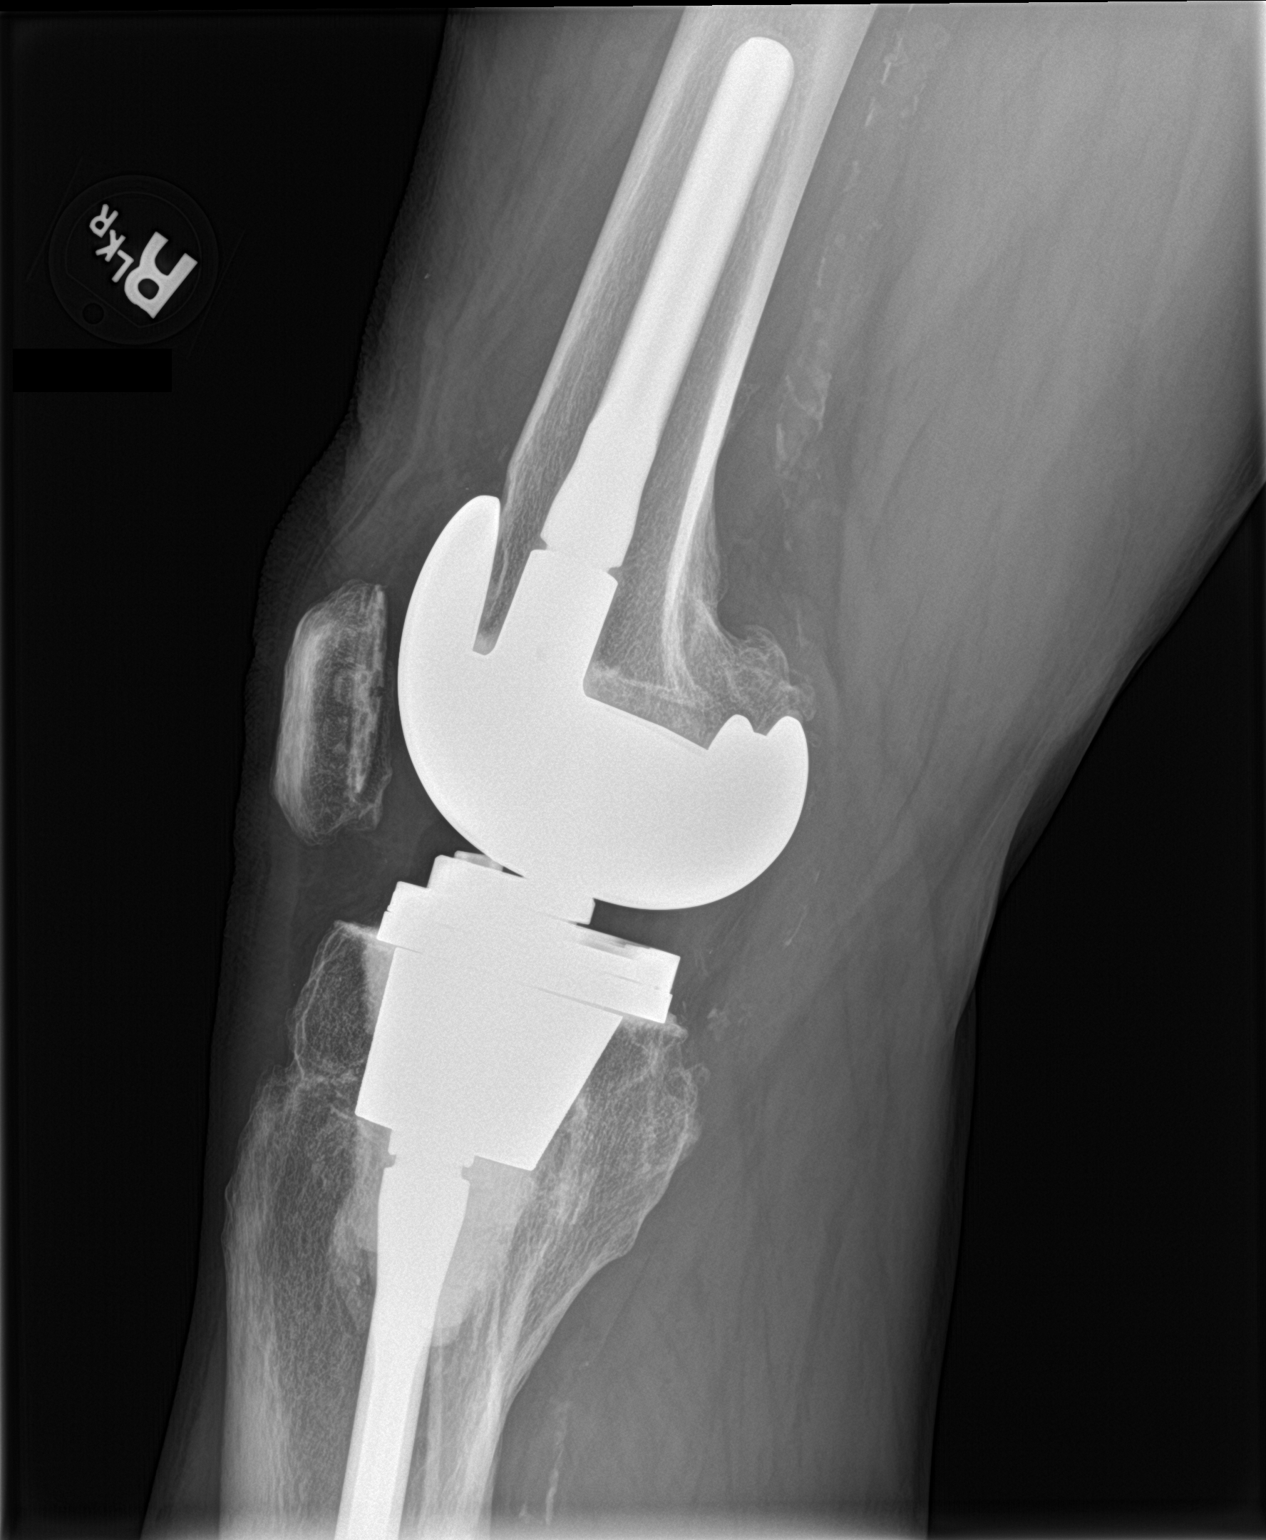

[knee ap]
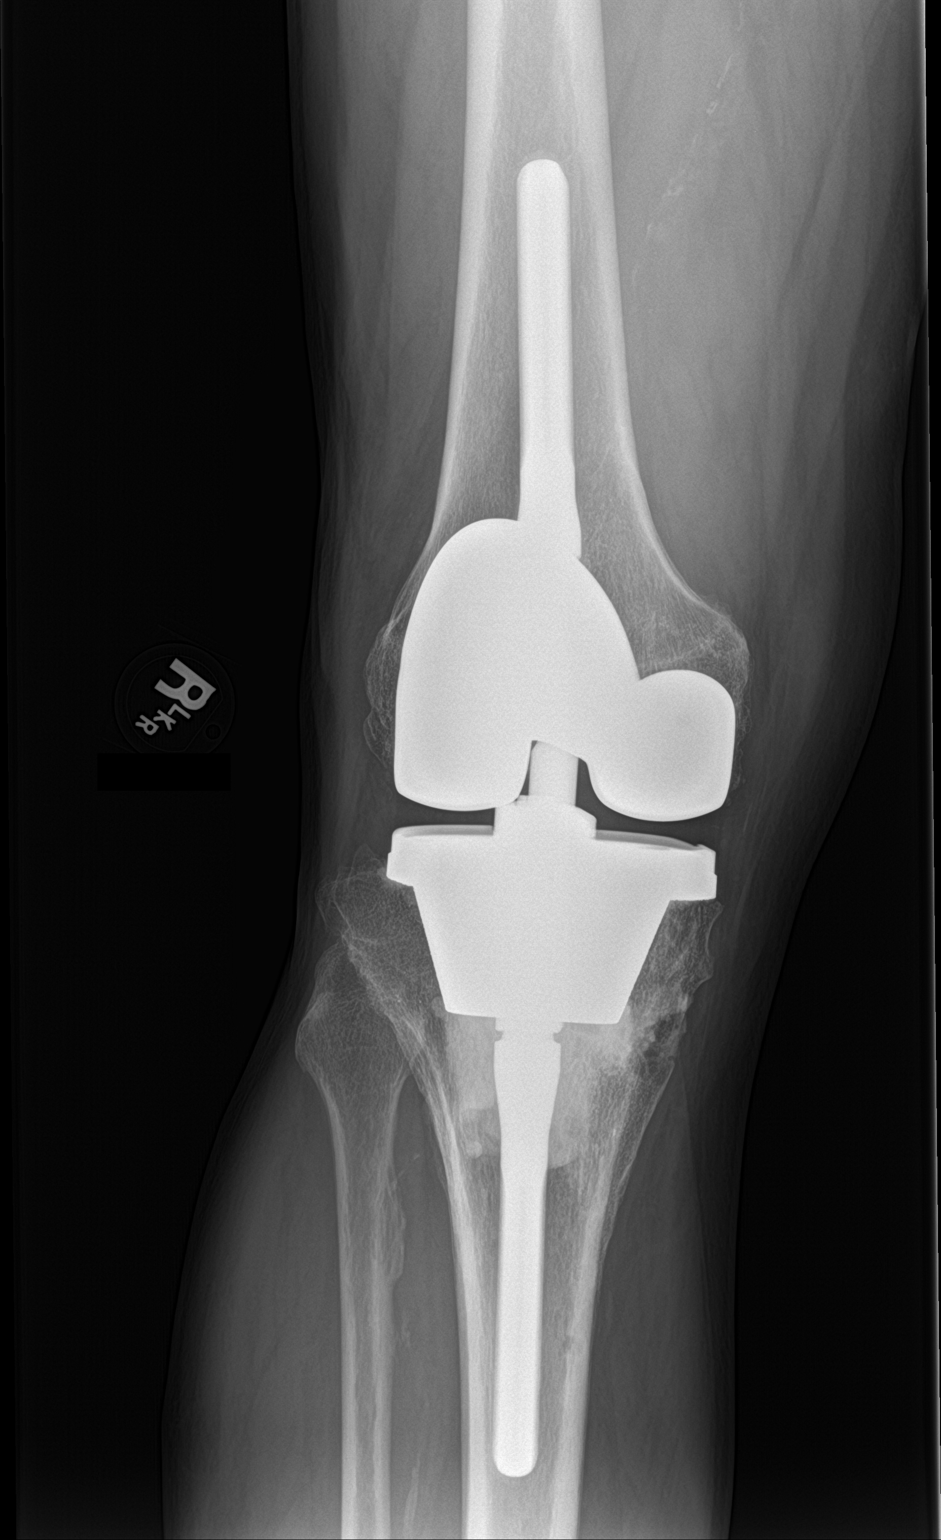

[2 of 2 positions shown; findings below may reference images not displayed]

FINDINGS: Total right knee replacement. Hardware intact. Anatomic alignment.
No acute bony abnormality. Peripheral vascular calcification.
IMPRESSION: 1.  Total right knee replacement with anatomic alignment.

2.  Peripheral vascular disease.

## 2018-05-13 IMAGING — CR DG LUMBAR SPINE COMPLETE W/ BEND
7 series · 7 of 7 positions shown · non-contrast
Comparison: [DATE].

CLINICAL DATA: Back pain.

EXAM:
LUMBAR SPINE - COMPLETE WITH BENDING VIEWS

[l-spine ap]
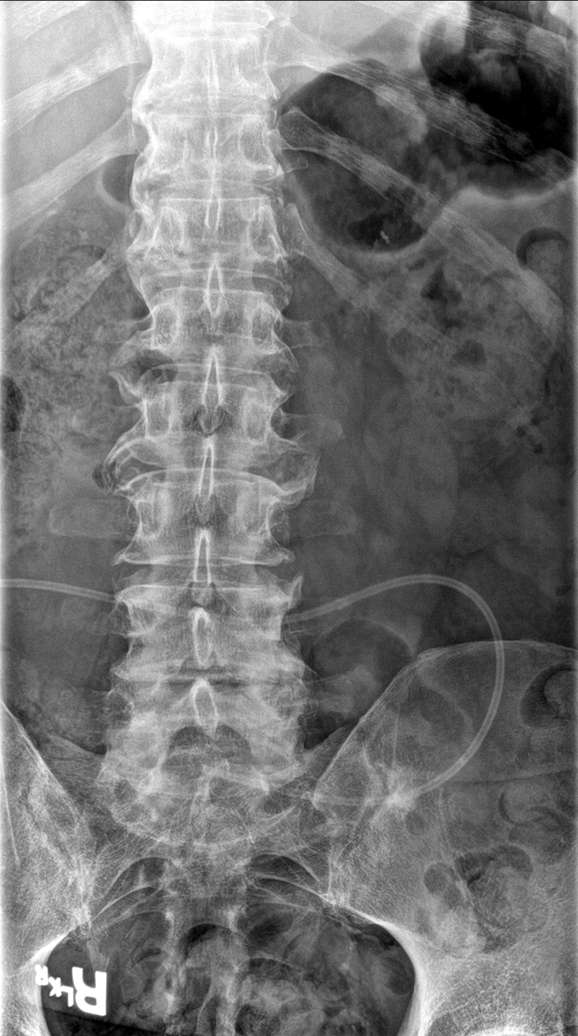

[l-spine obl (1 of 2)]
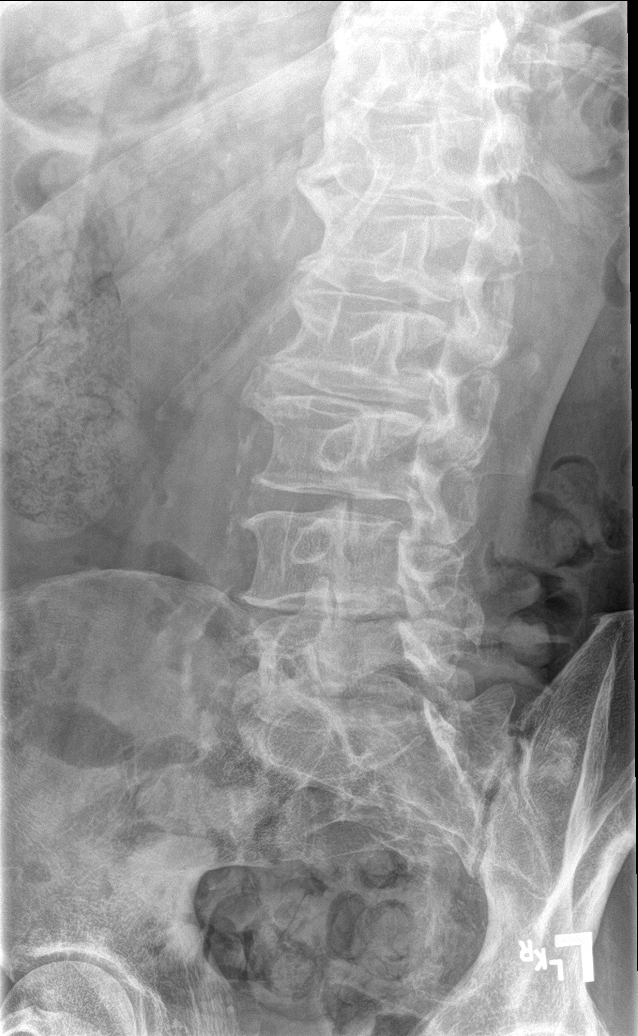

[l-spine obl (2 of 2)]
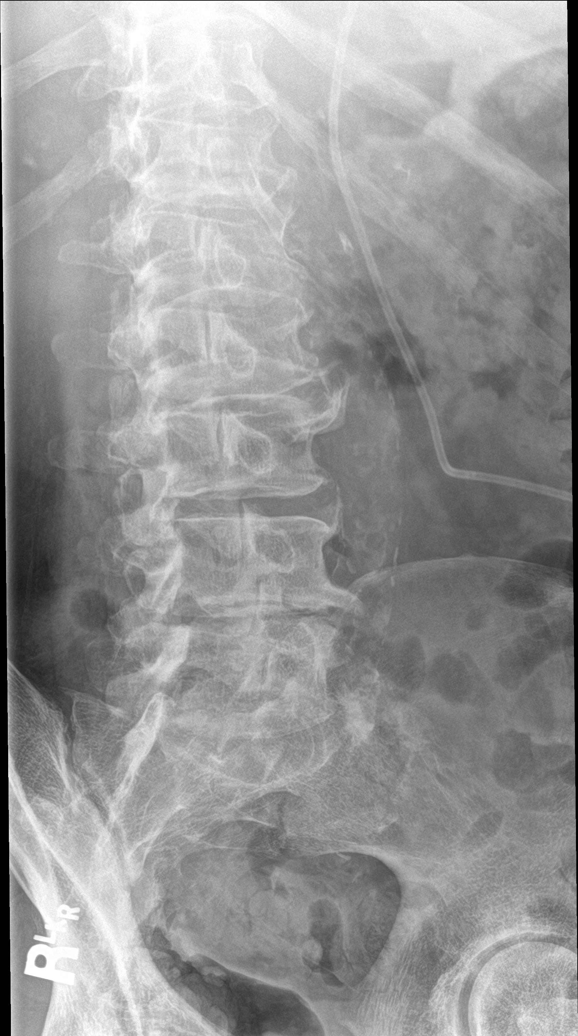

[l-spine lat]
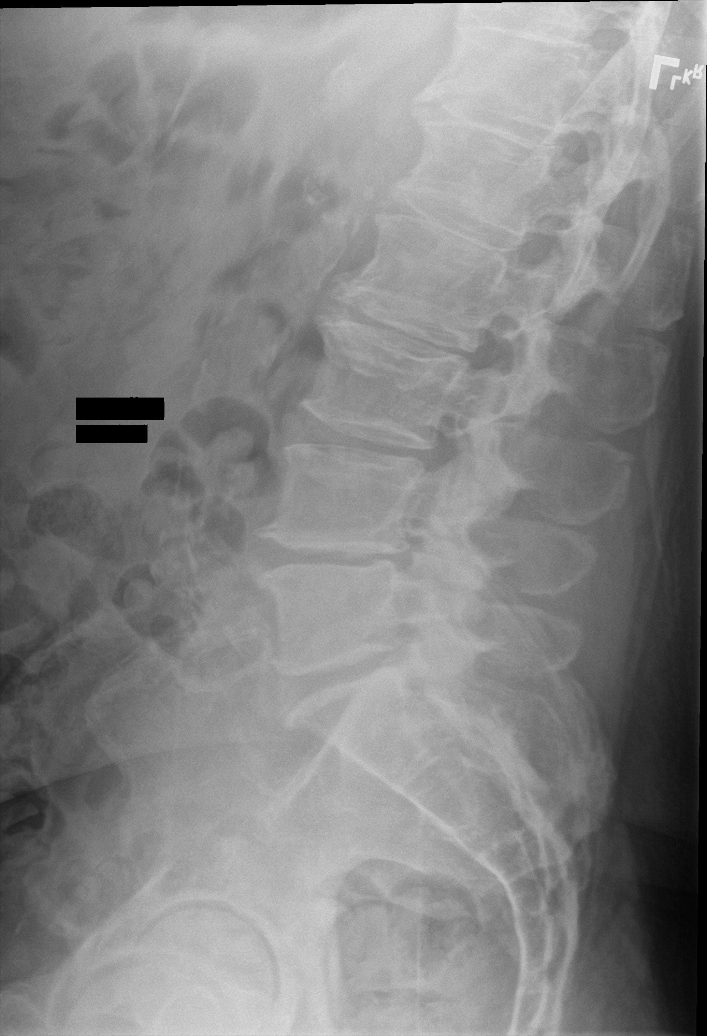

[l-spine spot]
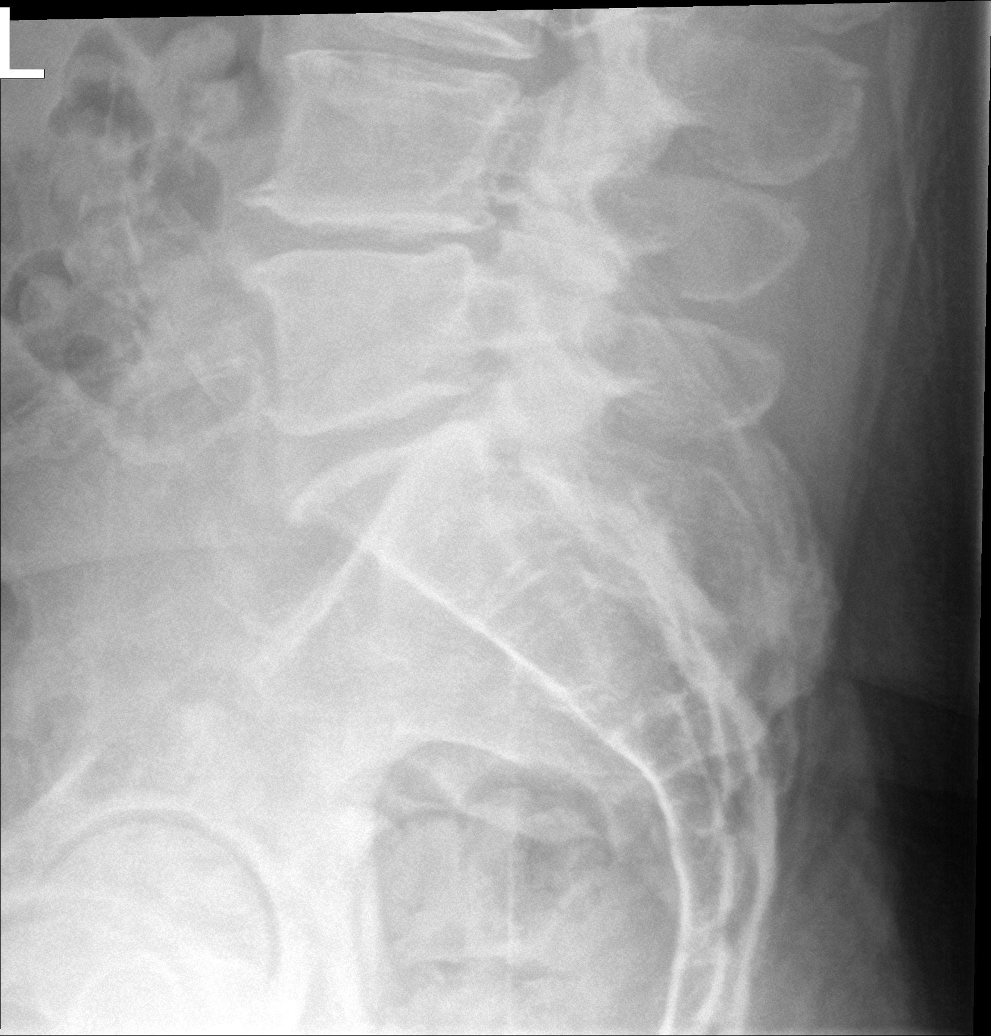

[l-spine flex]
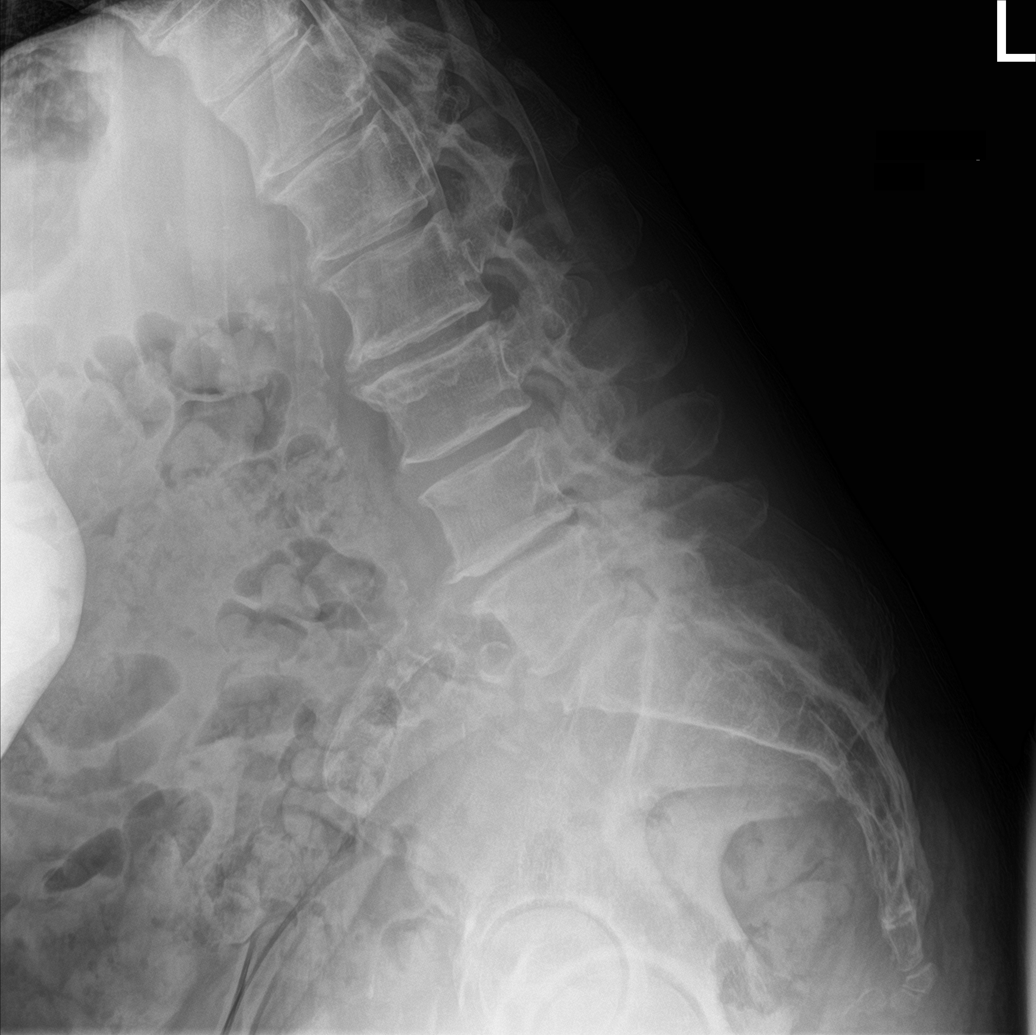

[l-spine ext]
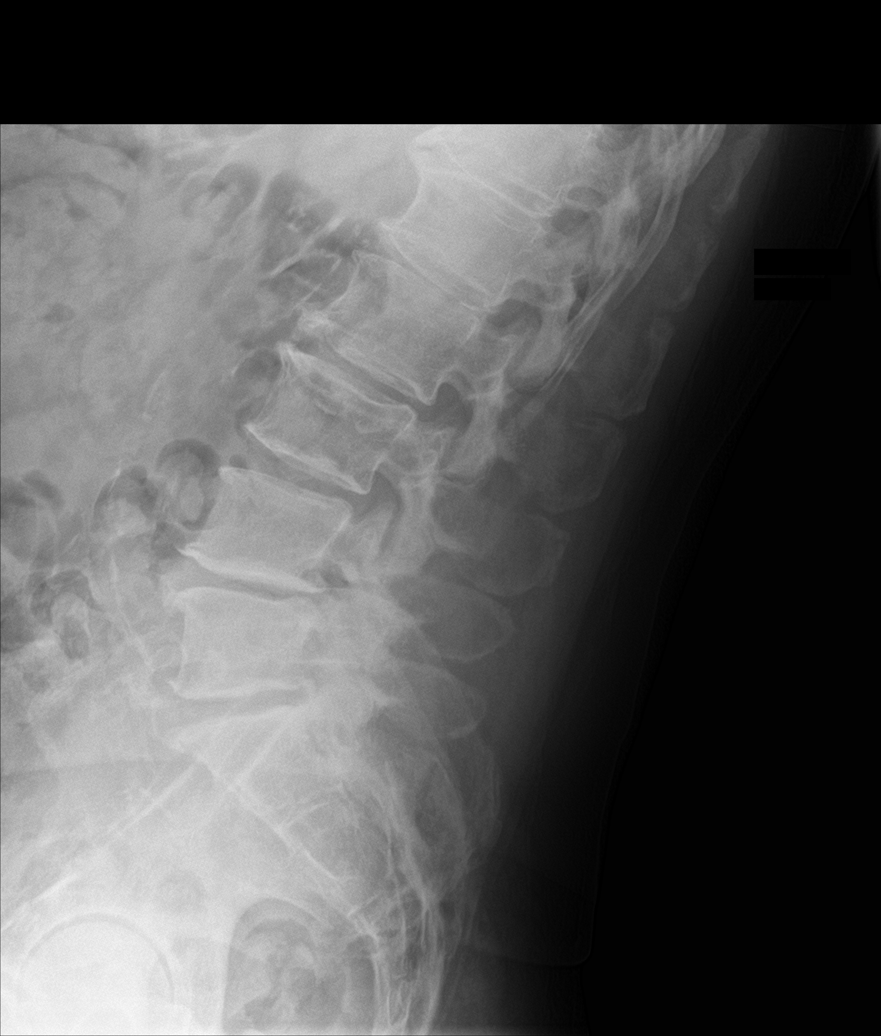

[7 of 7 positions shown; findings below may reference images not displayed]

FINDINGS: Degenerative changes lumbar spine. No acute bony abnormality
identified. No flexion or extension deformity noted. Aortoiliac
atherosclerotic vascular calcification. Catheters noted over the
abdomen.
IMPRESSION: 1. Diffuse degenerative change lumbar spine. No acute bony
abnormality identified. No flexion or extension deformity noted. No
acute bony abnormality.

2.  Aortoiliac atherosclerotic vascular disease.

## 2018-05-13 IMAGING — CR DG SI JOINTS 3+V
3 series · 3 of 3 positions shown · non-contrast
Comparison: [7G].

CLINICAL DATA: Back pain.  No known injury.

EXAM:
BILATERAL SACROILIAC JOINTS - 3+ VIEW

[si joints ap]
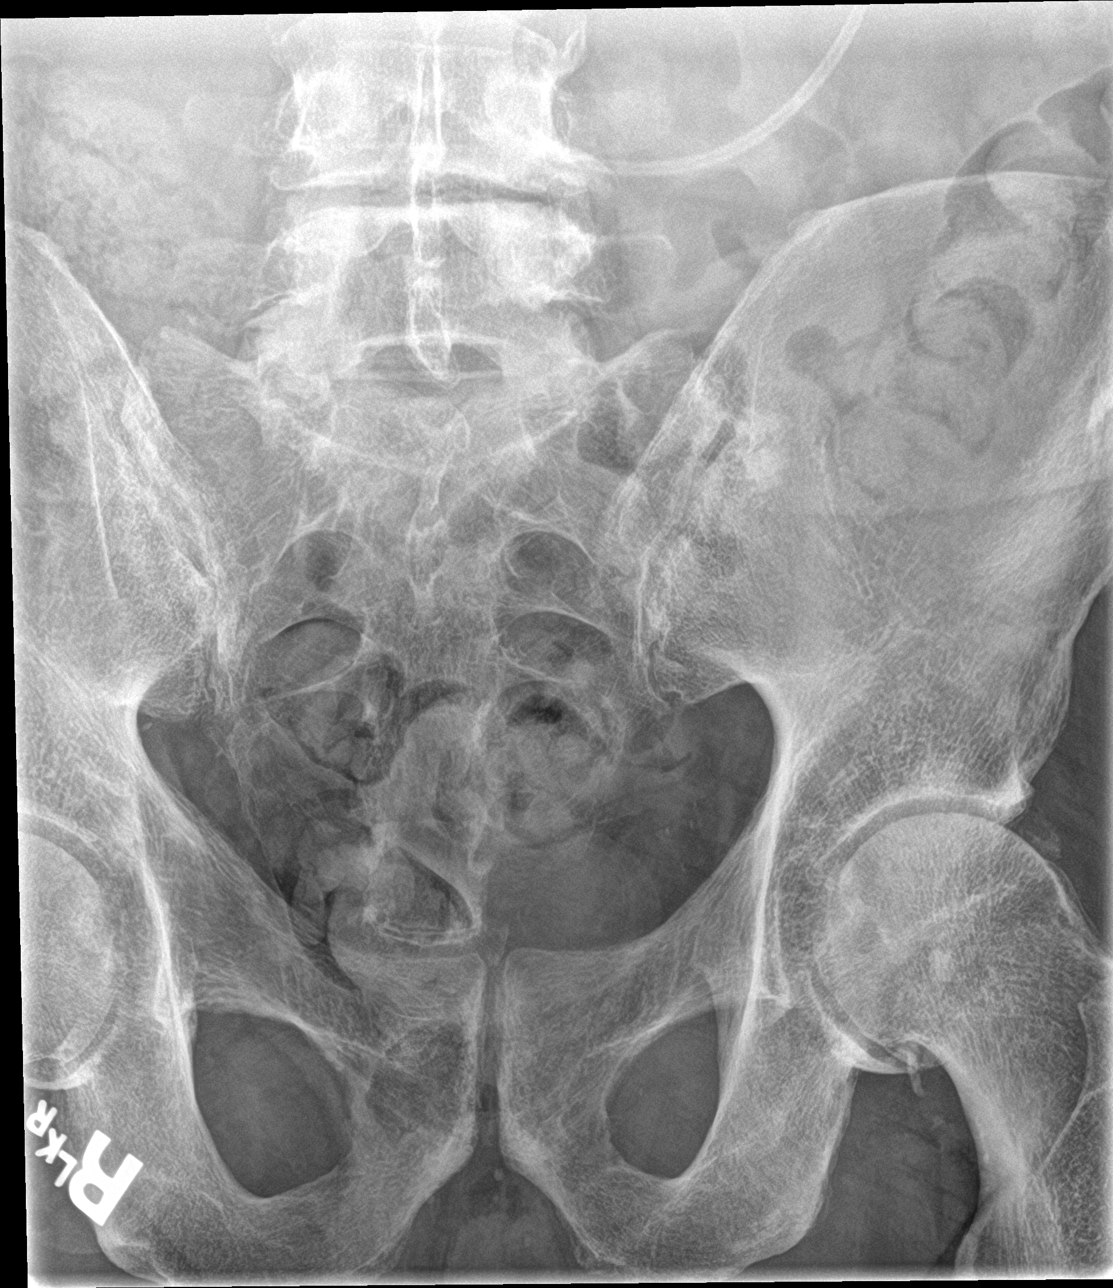

[si joints obl (1 of 2)]
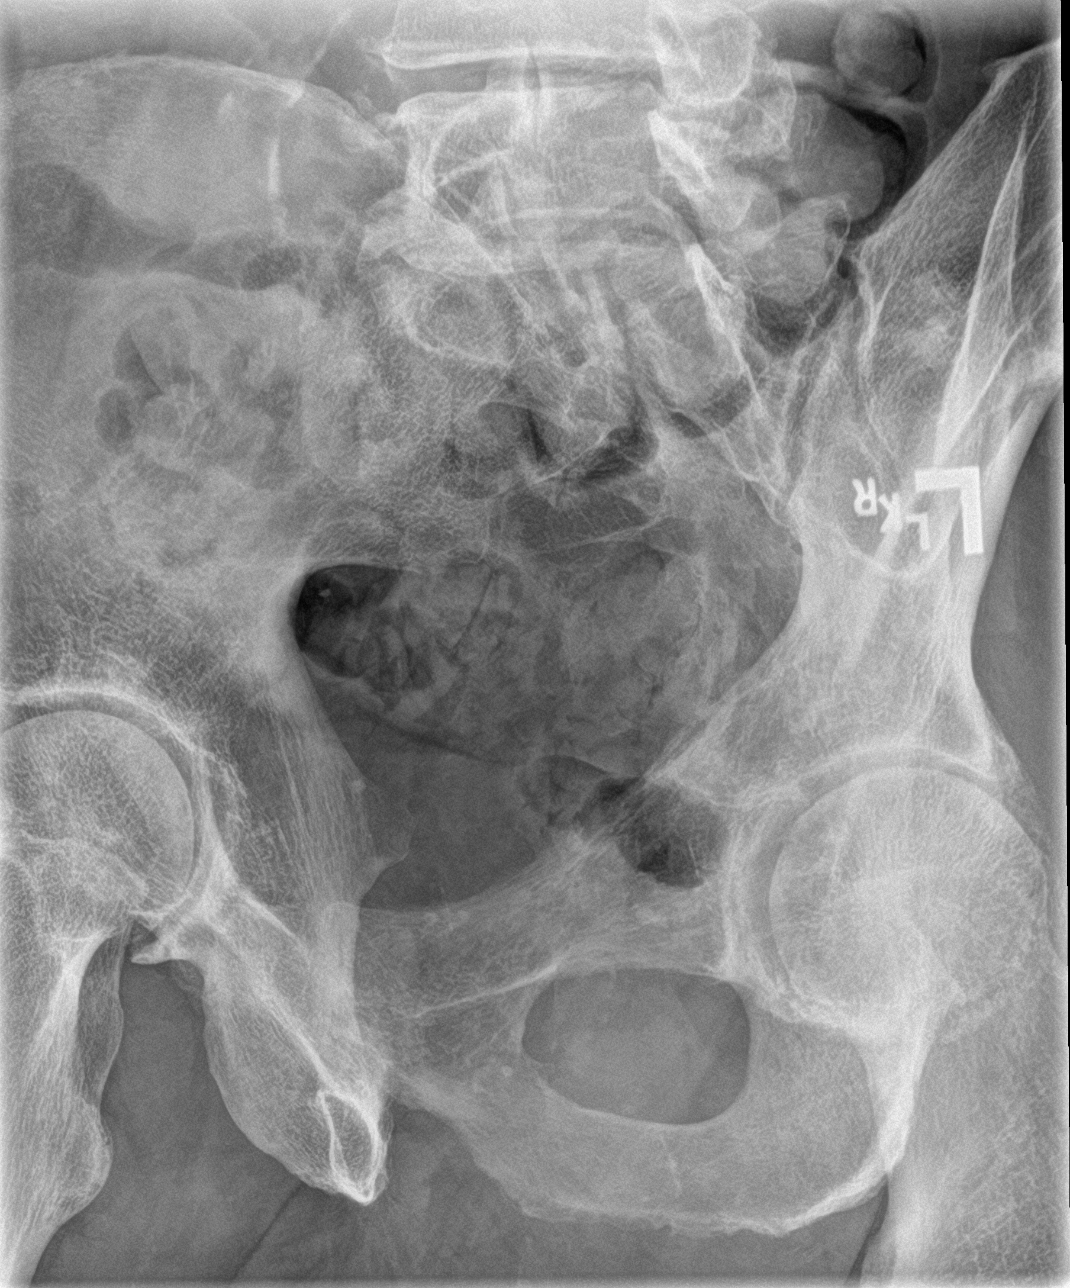

[si joints obl (2 of 2)]
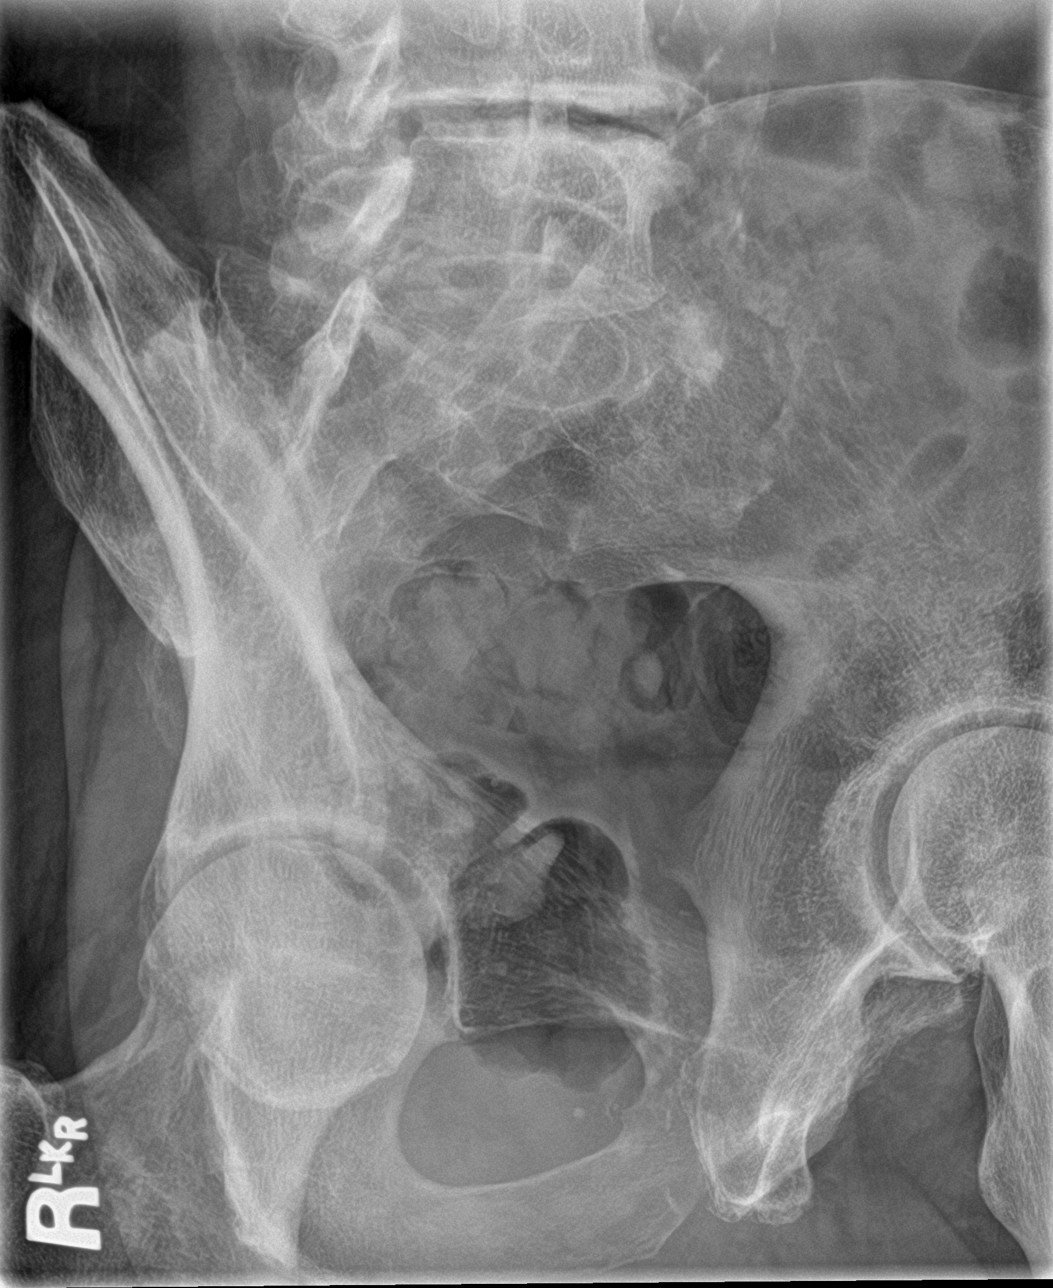

[3 of 3 positions shown; findings below may reference images not displayed]

FINDINGS: Degenerative changes lumbar spine, both SI joints, and both hips.
Diffuse osteopenia. No acute bony abnormality identified. Sclerotic
density noted over the left iliac region consistent with bone
island, no change from prior study of [DATE]. Sclerotic density
noted the proximal left femur most consistent with a bone island.
IMPRESSION: Degenerative changes lumbar spine, both SI joints, both hips. No
acute bony abnormality identified.

## 2018-05-13 IMAGING — CR DG KNEE 1-2V*L*
2 series · 2 of 2 positions shown · non-contrast
Comparison: No recent prior.

CLINICAL DATA: Bilateral knee pain. No known injury. Prior knee
surgery.

EXAM:
LEFT KNEE - 1-2 VIEW

[knee ap]
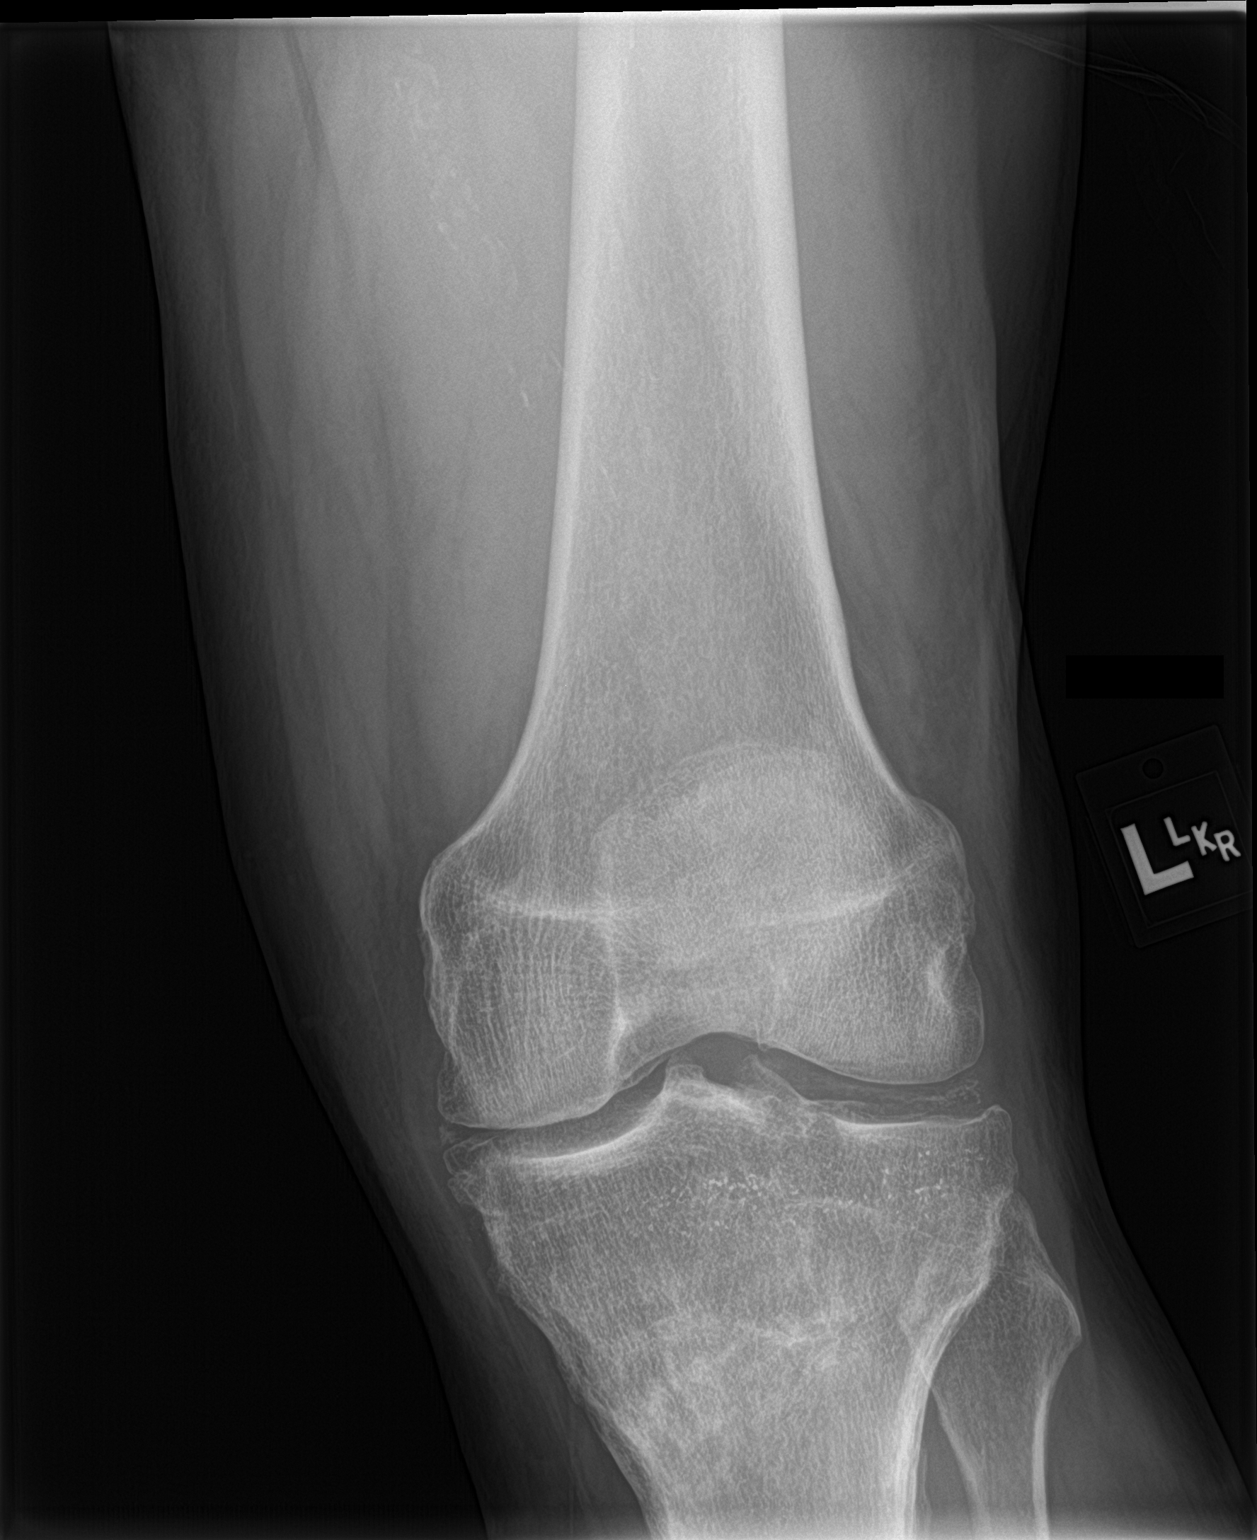

[knee lat]
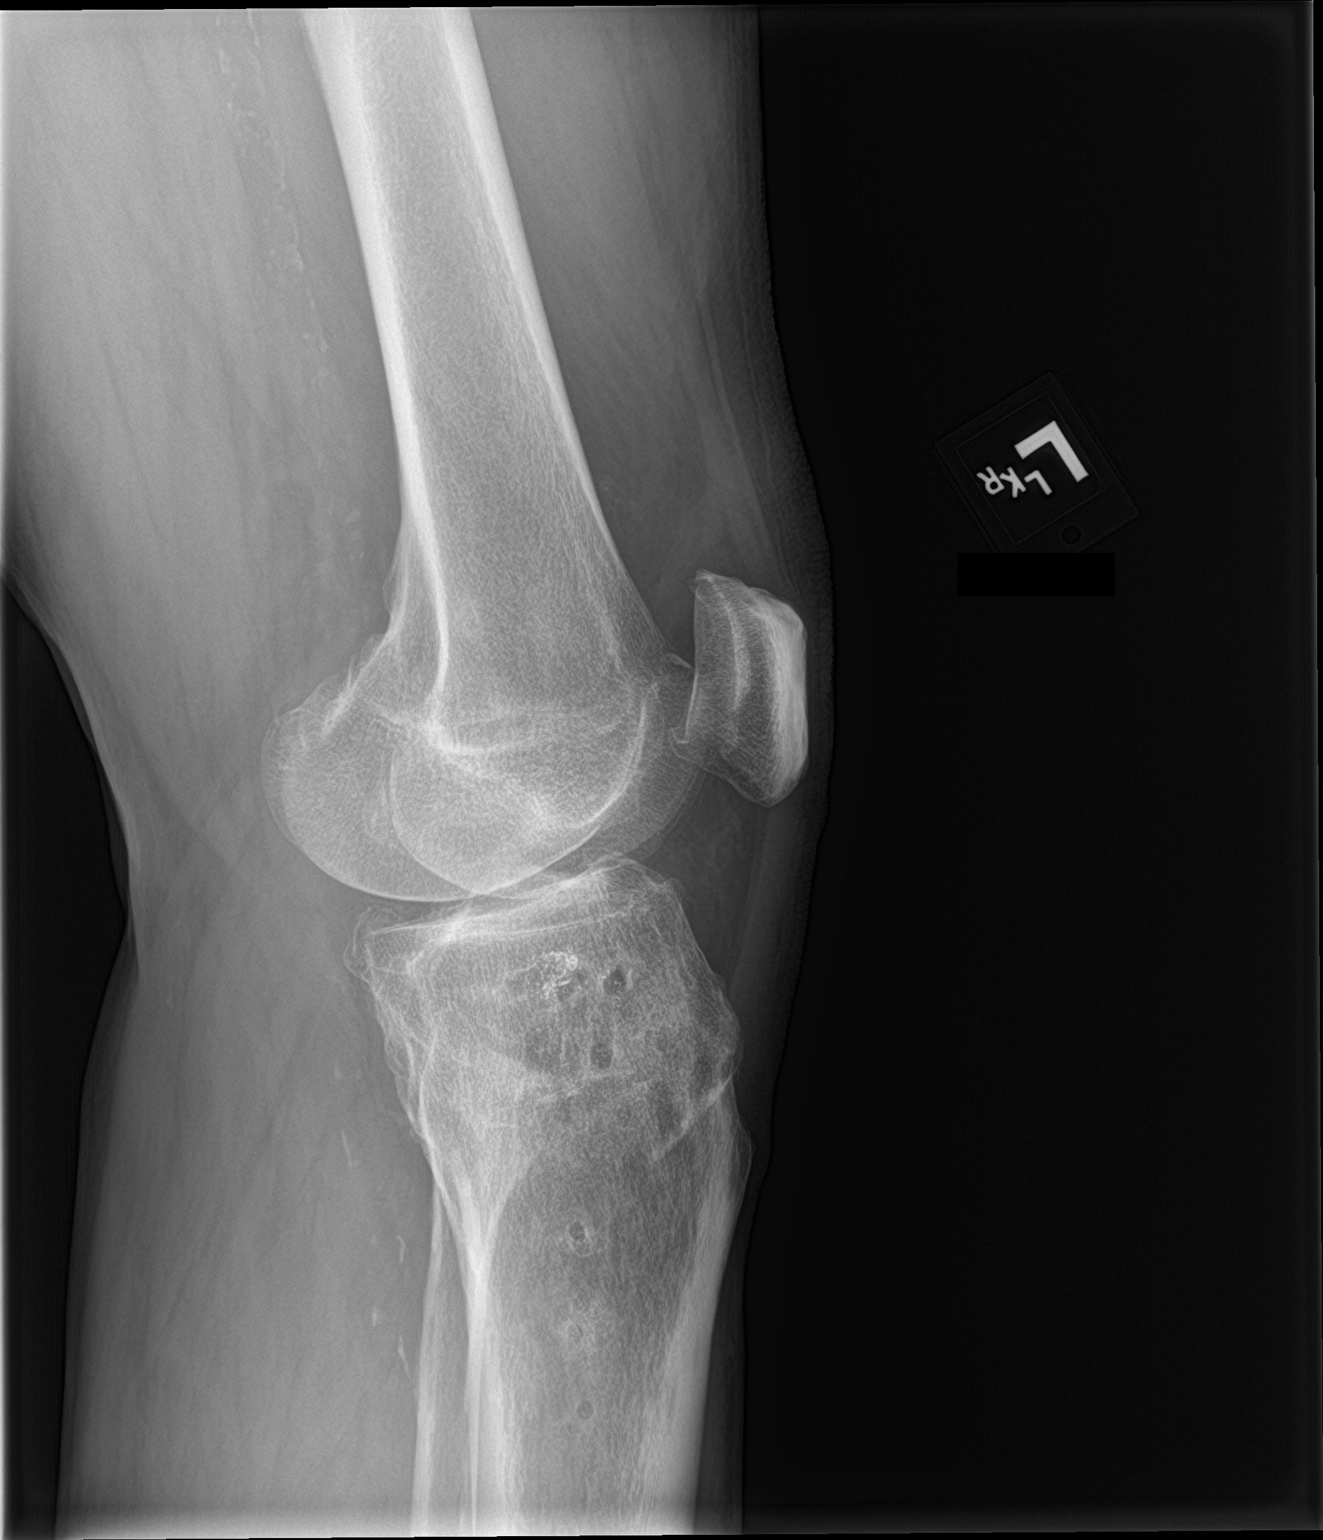

[2 of 2 positions shown; findings below may reference images not displayed]

FINDINGS: Tricompartment degenerative change with chondrocalcinosis.
Postsurgical changes proximal tibia. No acute bony abnormality. No
evidence of fracture. Peripheral vascular calcification noted.
IMPRESSION: 1. Degenerative change with chondrocalcinosis. Postsurgical changes
proximal tibia. No acute bony abnormality.

2.  Peripheral vascular disease.

## 2018-05-13 MED ORDER — PREGABALIN 50 MG PO CAPS: ORAL_CAPSULE | ORAL | 1 refills | Status: DC

## 2018-05-13 NOTE — Patient Instructions (Addendum)
Pick up Rx for Lyrica

## 2018-05-13 NOTE — Progress Notes (Signed)
Patient's Name: Albert Figueroa  MRN: [id number]  Referring Provider: Ashok Norris, MD  DOB: Oct 16, 1955  PCP: Dion Body, MD  DOS: 05/13/2018  Note by: Gillis Santa, MD  Service setting: Ambulatory outpatient  Specialty: Interventional Pain Management  Location: [facility] (AMB) Pain Management Facility  Visit type: Initial Patient Evaluation  Patient type: New Patient   Primary Reason(s) for Visit: Encounter for initial evaluation of one or more chronic problems (new to examiner) potentially causing chronic pain, and posing a threat to normal musculoskeletal function. (Level of risk: High) CC: New Patient (Initial Visit)  HPI  Albert Figueroa is a 62 y.o. year old, male patient, who comes today to see [place] for the first time for an initial evaluation of his chronic pain. He has CKD (chronic kidney disease) stage 3, GFR 30-59 ml/min ([place]); Biceps tendinitis; Diabetes mellitus, type 2 ([place]); Obesity (BMI 30-39.9); and Tobacco use on their problem list. Today he comes in for evaluation of his New Patient (Initial Visit)  Pain Assessment: Location: Left Hip Radiating: Yes. Radiates from hips down to front of legs to knees bilateral  Onset: More than a month ago Duration: Chronic pain Quality: Constant, Sharp, Numbness Severity: 7 /10 (subjective, self-reported pain score)  Note: Reported level is inconsistent with clinical observations. Clinically the patient looks like a 3/10 A 3/10 is viewed as "Moderate" and described as significantly interfering with activities of daily living (ADL). It becomes difficult to feed, bathe, get dressed, get on and off the toilet or to perform personal hygiene functions. Difficult to get in and out of bed or a chair without assistance. Very distracting. With effort, it can be ignored when deeply involved in activities. Information on the proper use of the pain scale provided to the patient today. When using our objective Pain Scale, levels between 6 and 10/10 are said to  belong in an emergency room, as it progressively worsens from a 6/10, described as severely limiting, requiring emergency care not usually available at an outpatient pain management facility. At a 6/10 level, communication becomes difficult and requires great effort. Assistance to reach the emergency department may be required. Facial flushing and profuse sweating along with potentially dangerous increases in heart rate and blood pressure will be evident. Effect on ADL: "Difficult to walk, have to sit down plenty of times"  Timing: Constant Modifying factors: rest, medications, laying down BP: 138/88  HR: 70  Onset and Duration: Present longer than 3 months Cause of pain: age Severity: Getting worse, NAS-11 at its best: 5/10 and NAS-11 now: 7/10 Timing: During activity or exercise Aggravating Factors: Bending, Climbing, Kneeling, Lifiting, Prolonged standing, Squatting, Stooping , Surgery made it worse, Twisting, Walking, Walking uphill, Walking downhill and Working Alleviating Factors: Lying down, Medications and Resting Associated Problems: Fatigue, Numbness, Spasms, Sweating, Tingling, Weakness, Pain that wakes patient up and Pain that does not allow patient to sleep Quality of Pain: Aching, Burning, Constant, Cramping, Distressing, Exhausting, Horrible, Sharp, Shooting, Stabbing and Tiring Previous Examinations or Tests: Endoscopy, X-rays and Orthopedic evaluation Previous Treatments: Epidural steroid injections and Narcotic medications  The patient comes into the clinics today for the first time for a chronic pain management evaluation.   62 year old male who presents with a chief complaint of axial low back, left hip pain that radiates into his left groin and down his left leg stopping at his knee.  Patient does have a history of chronic kidney disease with his most recent GFR 31, creatinine 2.2.  Patient is seeing Dr.  Deondrick Searls for chronic kidney management.  No NSAID therapy.  Patient does  see Dr. Sanjuan Dame in [facility] for chronic opioid management.  His medications include oxycodone 15 mg 4 times a day with 5 mg for breakthrough pain.  Total MME is approximately 100.  Patient states that his left hip and bilateral knees are very painful.  Patient denies any bowel or bladder dysfunction.  Patient ambulate with a cane.  Today I took the time to provide the patient with information regarding my pain practice. The patient was informed that my practice is divided into two sections: an interventional pain management section, as well as a completely separate and distinct medication management section. I explained that I have procedure days for my interventional therapies, and evaluation days for follow-ups and medication management. Because of the amount of documentation required during both, they are kept separated. This means that there is the possibility that he may be scheduled for a procedure on one day, and medication management the next. I have also informed him that because of staffing and facility limitations, I no longer take patients for medication management only. To illustrate the reasons for this, I gave the patient the example of surgeons, and how inappropriate it would be to refer a patient to his/her care, just to write for the post-surgical antibiotics on a surgery done by a different surgeon.   Because interventional pain management is my board-certified specialty, the patient was informed that joining my practice means that they are open to any and all interventional therapies. I made it clear that this does not mean that they will be forced to have any procedures done. What this means is that I believe interventional therapies to be essential part of the diagnosis and proper management of chronic pain conditions. Therefore, patients not interested in these interventional alternatives will be better served under the care of a different practitioner.  The patient was also made aware of my  Comprehensive Pain Management Safety Guidelines where by joining my practice, they limit all of their nerve blocks and joint injections to those done by our practice, for as long as we are retained to manage their care.   Historic Controlled Substance Pharmacotherapy Review  PMP and historical list of controlled substances: Oxycodone 15 mg 4 times a day, quantity 120/month; oxycodone 5 mg daily as needed breakthrough pain MME/day: Oxycodone 15 mg 4 times daily equals 90; oxycodone 5 mg daily as needed equals approximately 9 total MME approximately 100 mg/day Medications: The patient did not bring the medication(s) to the appointment, as requested in our "New Patient Package" Pharmacodynamics: Desired effects: Analgesia: The patient reports <50% benefit. Reported improvement in function: The patient reports medication allows him to accomplish basic ADLs. Clinically meaningful improvement in function (CMIF): Sustained CMIF goals met Perceived effectiveness: Described as relatively effective, allowing for increase in activities of daily living (ADL) Undesirable effects: Side-effects or Adverse reactions: None reported Historical Monitoring: The patient  reports that he does not use drugs. List of all UDS Test(s): No results found for: MDMA, COCAINSCRNUR, [place], [place], CANNABQUANT, [place], [place] List of other Serum/Urine Drug Screening Test(s):  No results found for: AMPHSCRSER, BARBSCRSER, BENZOSCRSER, COCAINSCRSER, COCAINSCRNUR, PCPSCRSER, PCPQUANT, THCSCRSER, THCU, CANNABQUANT, OPIATESCRSER, OXYSCRSER, PROPOXSCRSER, ETH Historical Background Evaluation: [place] PMP: Six (6) year initial data search conducted.             [place] Department of public safety, offender search: Editor, commissioning Information) Non-contributory Risk Assessment Profile: Aberrant behavior: None observed or detected today Risk factors  for fatal opioid overdose: None identified today Fatal overdose hazard ratio (HR): Calculation  deferred Non-fatal overdose hazard ratio (HR): Calculation deferred Risk of opioid abuse or dependence: 0.7-3.0% with doses ? 36 MME/day and 6.1-26% with doses ? 120 MME/day. Substance use disorder (SUD) risk level: See below Personal History of Substance Abuse (SUD-Substance use disorder):  Alcohol: Negative  Illegal Drugs: Negative  Rx Drugs: Negative  ORT Risk Level calculation: Low Risk Opioid Risk Tool - 05/13/18 1146      Family History of Substance Abuse   Alcohol  Negative    Illegal Drugs  Negative    Rx Drugs  Negative      Personal History of Substance Abuse   Alcohol  Negative    Illegal Drugs  Negative    Rx Drugs  Negative      Age   Age between 7-45 years   No      History of Preadolescent Sexual Abuse   History of Preadolescent Sexual Abuse  Negative or Male      Psychological Disease   Psychological Disease  Negative    Depression  Negative      Total Score   Opioid Risk Tool Scoring  0    Opioid Risk Interpretation  Low Risk      ORT Scoring interpretation table:  Score <3 = Low Risk for SUD  Score between 4-7 = Moderate Risk for SUD  Score >8 = High Risk for Opioid Abuse   PHQ-2 Depression Scale:  Total score: 1  PHQ-2 Scoring interpretation table: (Score and probability of major depressive disorder)  Score 0 = No depression  Score 1 = 15.4% Probability  Score 2 = 21.1% Probability  Score 3 = 38.4% Probability  Score 4 = 45.5% Probability  Score 5 = 56.4% Probability  Score 6 = 78.6% Probability   PHQ-9 Depression Scale:  Total score: 1  PHQ-9 Scoring interpretation table:  Score 0-4 = No depression  Score 5-9 = Mild depression  Score 10-14 = Moderate depression  Score 15-19 = Moderately severe depression  Score 20-27 = Severe depression (2.4 times higher risk of SUD and 2.89 times higher risk of overuse)   Pharmacologic Plan: As per protocol, I have not taken over any controlled substance management, pending the results of ordered  tests and/or consults.            Initial impression: Pending review of available data and ordered tests.  Meds   Current Outpatient Medications:  .  atorvastatin (LIPITOR) 40 MG tablet, Take 40 mg by mouth daily., Disp: , Rfl:  .  divalproex (DEPAKOTE ER) 500 MG 24 hr tablet, Take 500 mg by mouth daily., Disp: , Rfl:  .  esomeprazole (NEXIUM) 20 MG capsule, Take 20 mg by mouth daily at 12 noon., Disp: , Rfl:  .  glipiZIDE (GLUCOTROL) 10 MG tablet, Take 10 mg by mouth Nightly., Disp: , Rfl:  .  insulin detemir (LEVEMIR) 100 UNIT/ML injection, Inject into the skin at bedtime., Disp: , Rfl:  .  lisinopril-hydrochlorothiazide (PRINZIDE,ZESTORETIC) 10-12.5 MG tablet, Take 1 tablet by mouth daily., Disp: , Rfl:  .  oxyCODONE (OXY IR/ROXICODONE) 5 MG immediate release tablet, Take 5 mg by mouth every 4 (four) hours as needed for severe pain., Disp: , Rfl:  .  oxyCODONE (ROXICODONE) 15 MG immediate release tablet, Take 15 mg by mouth every 6 (six) hours as needed for pain., Disp: , Rfl:  .  senna-docusate (SENOKOT-S) 8.6-50 MG tablet, Take 1  tablet by mouth 2 (two) times daily., Disp: , Rfl:  .  pregabalin (LYRICA) 50 MG capsule, 50 mg qhs x 2 weeks then increase to 50 mg twice daily, Disp: 60 capsule, Rfl: 1  Imaging Review    Shoulder-R DG:  Results for orders placed during the hospital encounter of 04/05/15  DG Shoulder Right   Narrative CLINICAL DATA:  Acute onset right shoulder pain.  No known injury.  EXAM: RIGHT SHOULDER - 2+ VIEW  COMPARISON:  None.  FINDINGS: There is no evidence of fracture or dislocation. There is no evidence of arthropathy or other focal bone abnormality. Soft tissues are unremarkable.  Mild degenerative spurring and chondrocalcinosis seen involving the acromioclavicular joint. No other significant bone abnormality identified.  IMPRESSION: No acute findings.  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   Electronically  Signed   By: Earle Gell M.D.   On: 04/05/2015 19:53                   Complexity Note: Imaging results reviewed. Results shared with Mr. Rumble, using Layman's terms.                         ROS  Cardiovascular: High blood pressure Pulmonary or Respiratory: Smoking and Snoring  Neurological: Seizure disorder and Seizures (Epilepsy) Review of Past Neurological Studies: No results found for this or any previous visit. Psychological-Psychiatric: No reported psychological or psychiatric signs or symptoms such as difficulty sleeping, anxiety, depression, delusions or hallucinations (schizophrenial), mood swings (bipolar disorders) or suicidal ideations or attempts Gastrointestinal: No reported gastrointestinal signs or symptoms such as vomiting or evacuating blood, reflux, heartburn, alternating episodes of diarrhea and constipation, inflamed or scarred liver, or pancreas or irrregular and/or infrequent bowel movements Genitourinary: Kidney disease Hematological: Bleeding easily Endocrine: High blood sugar controlled without the use of insulin (NIDDM) Rheumatologic: Joint aches and or swelling due to excess weight (Osteoarthritis) and Rheumatoid arthritis Musculoskeletal: Negative for myasthenia gravis, muscular dystrophy, multiple sclerosis or malignant hyperthermia Work History: Disabled  Allergies  Mr. Domangue has No Known Allergies.  Laboratory Chemistry  Inflammation Markers (CRP: Acute Phase) (ESR: Chronic Phase) No results found for: CRP, ESRSEDRATE, LATICACIDVEN                       Rheumatology Markers No results found for: RF, ANA, LABURIC, URICUR, LYMEIGGIGMAB, LYMEABIGMQN, HLAB27                      Renal Function Markers Lab Results  Component Value Date   BUN 9 10/06/2012   CREATININE 0.83 10/06/2012   GFRAA >60 10/06/2012   GFRNONAA >60 10/06/2012                             Hepatic Function Markers Lab Results  Component Value Date   AST 17 10/06/2012   ALT 25  10/06/2012   ALBUMIN 4.0 10/06/2012   ALKPHOS 154 (H) 10/06/2012                        Electrolytes Lab Results  Component Value Date   NA 137 10/06/2012   K 3.7 10/06/2012   CL 105 10/06/2012   CALCIUM 9.4 10/06/2012                        Neuropathy Markers No results  found for: VITAMINB12, FOLATE, HGBA1C, HIV                      Bone Pathology Markers No results found for: [place], [id number], [id number], [id number], 25OHVITD1, 25OHVITD2, 25OHVITD3, TESTOFREE, TESTOSTERONE                       Coagulation Parameters Lab Results  Component Value Date   PLT 233 10/06/2012                        Cardiovascular Markers Lab Results  Component Value Date   HGB 15.5 10/06/2012   HCT 46.7 10/06/2012                         CA Markers No results found for: CEA, CA125, LABCA2                      Note: Lab results reviewed.  PFSH  Drug: Mr. Zawadzki  reports that he does not use drugs. Alcohol:  reports that he does not drink alcohol. Tobacco:  reports that he has been smoking cigarettes. He has been smoking about 2.00 packs per day. He has never used smokeless tobacco. Medical:  has a past medical history of Arthritis, Diabetes mellitus without complication ([place]), Hypertension, Kidney disease, Osteoarthrosis, and Seizure ([place]). Family: family history is not on file.  Past Surgical History:  Procedure Laterality Date  . KNEE SURGERY     Active Ambulatory Problems    Diagnosis Date Noted  . CKD (chronic kidney disease) stage 3, GFR 30-59 ml/min ([facility]) 10/29/2017  . Biceps tendinitis 04/06/2015  . Diabetes mellitus, type 2 ([place]) 06/13/2015  . Obesity (BMI 30-39.9) 11/27/2015  . Tobacco use 06/13/2015   Resolved Ambulatory Problems    Diagnosis Date Noted  . No Resolved Ambulatory Problems   Past Medical History:  Diagnosis Date  . Arthritis   . Diabetes mellitus without complication ([place])   . Hypertension   . Kidney disease   . Osteoarthrosis   . Seizure ([place])     Constitutional Exam  General appearance: Well nourished, well developed, and well hydrated. In no apparent acute distress Vitals:   05/13/18 1129  BP: 138/88  Pulse: 70  Resp: 16  Temp: 98.2 F (36.8 C)  TempSrc: Oral  SpO2: 98%  Weight: 242 lb (109.8 kg)  Height: '5\' 11"'  (1.803 m)   BMI Assessment: Estimated body mass index is 33.75 kg/m as calculated from the following:   Height as of this encounter: '5\' 11"'  (1.803 m).   Weight as of this encounter: 242 lb (109.8 kg).  BMI interpretation table: BMI level Category Range association with higher incidence of chronic pain  <18 kg/m2 Underweight   18.5-24.9 kg/m2 Ideal body weight   25-29.9 kg/m2 Overweight Increased incidence by 20%  30-34.9 kg/m2 Obese (Class I) Increased incidence by 68%  35-39.9 kg/m2 Severe obesity (Class II) Increased incidence by 136%  >40 kg/m2 Extreme obesity (Class III) Increased incidence by 254%   Patient's current BMI Ideal Body weight  Body mass index is 33.75 kg/m. Ideal body weight: 75.3 kg (166 lb 0.1 oz) Adjusted ideal body weight: 89.1 kg (196 lb 6.5 oz)   BMI Readings from Last 4 Encounters:  05/13/18 33.75 kg/m  04/05/15 32.78 kg/m   Wt Readings from Last 4 Encounters:  05/13/18 242 lb (109.8 kg)  04/05/15 235 lb (  106.6 kg)  Psych/Mental status: Alert, oriented x 3 (person, place, & time)       Eyes: PERLA Respiratory: No evidence of acute respiratory distress  Cervical Spine Area Exam  Skin & Axial Inspection: No masses, redness, edema, swelling, or associated skin lesions Alignment: Symmetrical Functional ROM: Unrestricted ROM      Stability: No instability detected Muscle Tone/Strength: Functionally intact. No obvious neuro-muscular anomalies detected. Sensory (Neurological): Unimpaired Palpation: No palpable anomalies              Upper Extremity (UE) Exam    Side: Right upper extremity  Side: Left upper extremity  Skin & Extremity Inspection: Skin color, temperature,  and hair growth are WNL. No peripheral edema or cyanosis. No masses, redness, swelling, asymmetry, or associated skin lesions. No contractures.  Skin & Extremity Inspection: Skin color, temperature, and hair growth are WNL. No peripheral edema or cyanosis. No masses, redness, swelling, asymmetry, or associated skin lesions. No contractures.  Functional ROM: Unrestricted ROM          Functional ROM: Unrestricted ROM          Muscle Tone/Strength: Functionally intact. No obvious neuro-muscular anomalies detected.  Muscle Tone/Strength: Functionally intact. No obvious neuro-muscular anomalies detected.  Sensory (Neurological): Unimpaired          Sensory (Neurological): Unimpaired          Palpation: No palpable anomalies              Palpation: No palpable anomalies              Provocative Test(s):  Phalen's test: deferred Tinel's test: deferred Apley's scratch test (touch opposite shoulder):  Action 1 (Across chest): deferred Action 2 (Overhead): deferred Action 3 (LB reach): deferred   Provocative Test(s):  Phalen's test: deferred Tinel's test: deferred Apley's scratch test (touch opposite shoulder):  Action 1 (Across chest): deferred Action 2 (Overhead): deferred Action 3 (LB reach): deferred    Thoracic Spine Area Exam  Skin & Axial Inspection: No masses, redness, or swelling Alignment: Symmetrical Functional ROM: Unrestricted ROM Stability: No instability detected Muscle Tone/Strength: Functionally intact. No obvious neuro-muscular anomalies detected. Sensory (Neurological): Unimpaired Muscle strength & Tone: No palpable anomalies  Lumbar Spine Area Exam  Skin & Axial Inspection: No masses, redness, or swelling Alignment: Symmetrical Functional ROM: Decreased ROM       Stability: No instability detected Muscle Tone/Strength: Functionally intact. No obvious neuro-muscular anomalies detected. Sensory (Neurological): Musculoskeletal pain pattern and dermatomal Palpation: No  palpable anomalies       Provocative Tests: Hyperextension/rotation test: Positive bilaterally for facet joint pain. Lumbar quadrant test (Kemp's test): (+) bilaterally for facet joint pain. Lateral bending test: deferred today       Patrick's Maneuver: deferred today                   FABER test: deferred today                   S-I anterior distraction/compression test: deferred today         S-I lateral compression test: (+)   S-I arthralgia/arthropathy on the left S-I Thigh-thrust test: deferred today         S-I Gaenslen's test: deferred today          Gait & Posture Assessment  Ambulation: Patient ambulates using a cane Gait: Antalgic Posture: Difficulty with positional changes   Lower Extremity Exam    Side: Right lower extremity  Side: Left lower  extremity  Stability: No instability observed          Stability: No instability observed          Skin & Extremity Inspection: Evidence of prior arthroplastic surgery  Skin & Extremity Inspection: Evidence of prior arthroplastic surgery  Functional ROM: Decreased ROM for knee joint          Functional ROM: Decreased ROM for knee joint          Muscle Tone/Strength: Functionally intact. No obvious neuro-muscular anomalies detected.  Muscle Tone/Strength: Functionally intact. No obvious neuro-muscular anomalies detected.  Sensory (Neurological): Arthropathic arthralgia  Sensory (Neurological): Arthropathic arthralgia  Palpation: No palpable anomalies  Palpation: No palpable anomalies   Assessment  Primary Diagnosis & Pertinent Problem List: The primary encounter diagnosis was Chronic left-sided low back pain with left-sided sciatica. Diagnoses of Left hip pain, Chronic pain of both knees, Chronic pain syndrome, S/P VP shunt, CKD (chronic kidney disease) stage 3, GFR 30-59 ml/min ([facility]), and Obesity (BMI 30-39.9) were also pertinent to this visit.  Visit Diagnosis (New problems to examiner): 1. Chronic left-sided low back pain with  left-sided sciatica   2. Left hip pain   3. Chronic pain of both knees   4. Chronic pain syndrome   5. S/P VP shunt   6. CKD (chronic kidney disease) stage 3, GFR 30-59 ml/min (H[facility])   7. Obesity (BMI 30-39.9)   General Recommendations: The pain condition that the patient suffers from is best treated with a multidisciplinary approach that involves an increase in physical activity to prevent de-conditioning and worsening of the pain cycle, as well as psychological counseling (formal and/or informal) to address the co-morbid psychological affects of pain. Treatment will often involve judicious use of pain medications and interventional procedures to decrease the pain, allowing the patient to participate in the physical activity that will ultimately produce long-lasting pain reductions. The goal of the multidisciplinary approach is to return the patient to a higher level of overall function and to restore their ability to perform activities of daily living.  62 year old male who presents with a chief complaint of axial low back, left hip pain that radiates into his left groin and down his left leg stopping at his knee.  Patient does have a history of chronic kidney disease with his most recent GFR 31, creatinine 2.2.  Patient is seeing Dr. Holley Raring for chronic kidney management.  No NSAID therapy.  Patient does see Dr. Sanjuan Dame in [facility] for chronic opioid management.  His medications include oxycodone 15 mg 4 times a day with 5 mg for breakthrough pain.  Total MME is approximately 100.  Patient states that his left hip and bilateral knees are very painful.  Patient denies any bowel or bladder dysfunction.  Patient ambulate with a cane.  Regards to his low back and hip pain this could be secondary to lumbar versus SI joint pathology.  Recommend obtaining lumbar spine x-rays as well as SI joint x-rays to evaluate for degeneration and facet arthropathy.  Patient may require advanced imaging in the form of lumbar MRI  depending upon results.  Pending these results, we can discuss lumbar facet medial branch nerve blocks versus SI joint injections.  In regards to the patient's bilateral knee pain secondary to knee osteoarthritis, will obtain bilateral knee x-rays.  Depending upon results, we can discuss various procedures including Visco supplementation, steroid injection, genicular nerve block.  In regards to opioid management, I had a detailed discussion with the patient about our clinic  policy.  Patient's current opioid dose is approximately 100 MME.  How to take him on for opioid therapy, he would have to reduce his opioid dose to less than 60.  I have encouraged the patient to start weaning his oxycodone 15 mg and start taking it 3 times a day as needed if he would like to be considered for chronic opioid therapy.  Furthermore the patient will need a pain psych evaluation for risk of substance abuse disorder.  Patient was somewhat reluctant to do this.  I informed the patient that if he does not want to see the pain psychologist, he will not be able to receive opioid medications from this clinic as this is a necessary step in the work-up.  I will also obtain a urine drug screen today which should be positive for oxycodone and its metabolites.  Plan: -UDS today.  Should be positive for oxycodone and its metabolites. 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  Patient's GFR is 31. -Lyrica as below, renally dosed -Lumbar, SI joint, bilateral knee x-rays as below  Plan of Care (Initial workup plan)  Note: Please be advised that as per protocol, today's visit has been an evaluation only. We have not taken over the patient's controlled substance management.  Problem-specific plan: No problem-specific Assessment & Plan notes found for this encounter.  Ordered Lab-work, Procedure(s), Referral(s), & Consult(s): Orders Placed This Encounter  Procedures  . DG Knee 1-2 Views Left  . DG Knee 1-2 Views  Right  . DG Si Joints  . DG Lumbar Spine Complete W/Bend  . Compliance Drug Analysis, Ur   Pharmacotherapy (current): Medications ordered:  Meds ordered this encounter  Medications  . DISCONTD: pregabalin (LYRICA) 50 MG capsule    Sig: 50 mg qhs x 2 weeks then increase to 50 mg twice daily    Dispense:  60 capsule    Refill:  1    Do not place this medication, or any other prescription from our practice, on "Automatic Refill". Patient may have prescription filled one day early if pharmacy is closed on scheduled refill date.  Marland Kitchen DISCONTD: pregabalin (LYRICA) 50 MG capsule    Sig: 50 mg qhs x 2 weeks then increase to 50 mg twice daily    Dispense:  60 capsule    Refill:  1    Do not place this medication, or any other prescription from our practice, on "Automatic Refill". Patient may have prescription filled one day early if pharmacy is closed on scheduled refill date.  . pregabalin (LYRICA) 50 MG capsule    Sig: 50 mg qhs x 2 weeks then increase to 50 mg twice daily    Dispense:  60 capsule    Refill:  1    Do not place this medication, or any other prescription from our practice, on "Automatic Refill". Patient may have prescription filled one day early if pharmacy is closed on scheduled refill date.   Medications administered during this visit: Marylou Flesher had no medications administered during this visit.   Pharmacological management options:  Opioid Analgesics: The patient was informed that there is no guarantee that he would be a candidate for opioid analgesics. The decision will be made following CDC guidelines. This decision will be based on the results of diagnostic studies, as well as Mr. Tome's risk profile.   Membrane stabilizer: To be determined at a later time  Muscle relaxant: To be determined at a later time  NSAID: To be determined  at a later time  Other analgesic(s): To be determined at a later time   Interventional management options: Mr. Brigante was informed that  there is no guarantee that he would be a candidate for interventional therapies. The decision will be based on the results of diagnostic studies, as well as Mr. Bessire's risk profile.  Procedure(s) under consideration:  Left lumbar facet medial branch nerve blocks Left lumbar epidural steroid injection Left SI joint injection Bilateral genicular nerve block with possible radio frequency ablation   Provider-requested follow-up: Return in about 4 weeks (around 06/10/2018) for Medication Management.  No future appointments.  Primary Care Physician: Dion Body, MD Location: [facility] Note by: Gillis Santa, M.D, Date: 05/13/2018; Time: 1:52 PM  Patient Instructions  Pick up Rx for Lyrica

## 2018-05-13 NOTE — Progress Notes (Signed)
Safety precautions to be maintained throughout the outpatient stay will include: orient to surroundings, keep bed in low position, maintain call bell within reach at all times, provide assistance with transfer out of bed and ambulation.  

## 2018-05-14 ENCOUNTER — Ambulatory Visit: Payer: Medicare Other | Admitting: Student in an Organized Health Care Education/Training Program

## 2018-05-19 LAB — COMPLIANCE DRUG ANALYSIS, UR

## 2018-06-11 ENCOUNTER — Ambulatory Visit
Payer: Medicare Other | Attending: Student in an Organized Health Care Education/Training Program | Admitting: Student in an Organized Health Care Education/Training Program

## 2018-06-11 ENCOUNTER — Encounter: Payer: Self-pay | Admitting: Student in an Organized Health Care Education/Training Program

## 2018-06-11 ENCOUNTER — Other Ambulatory Visit: Payer: Self-pay

## 2018-06-11 VITALS — BP 117/64 | HR 67 | Temp 98.2°F | Resp 16 | Ht 71.0 in | Wt 242.0 lb

## 2018-06-11 DIAGNOSIS — M25552 Pain in left hip: Secondary | ICD-10-CM | POA: Insufficient documentation

## 2018-06-11 DIAGNOSIS — E1122 Type 2 diabetes mellitus with diabetic chronic kidney disease: Secondary | ICD-10-CM | POA: Insufficient documentation

## 2018-06-11 DIAGNOSIS — G8929 Other chronic pain: Secondary | ICD-10-CM | POA: Insufficient documentation

## 2018-06-11 DIAGNOSIS — I129 Hypertensive chronic kidney disease with stage 1 through stage 4 chronic kidney disease, or unspecified chronic kidney disease: Secondary | ICD-10-CM | POA: Insufficient documentation

## 2018-06-11 DIAGNOSIS — G894 Chronic pain syndrome: Secondary | ICD-10-CM | POA: Insufficient documentation

## 2018-06-11 DIAGNOSIS — Z794 Long term (current) use of insulin: Secondary | ICD-10-CM | POA: Insufficient documentation

## 2018-06-11 DIAGNOSIS — Z982 Presence of cerebrospinal fluid drainage device: Secondary | ICD-10-CM | POA: Diagnosis not present

## 2018-06-11 DIAGNOSIS — M5442 Lumbago with sciatica, left side: Secondary | ICD-10-CM | POA: Insufficient documentation

## 2018-06-11 DIAGNOSIS — M25561 Pain in right knee: Secondary | ICD-10-CM

## 2018-06-11 DIAGNOSIS — Z6833 Body mass index (BMI) 33.0-33.9, adult: Secondary | ICD-10-CM | POA: Insufficient documentation

## 2018-06-11 DIAGNOSIS — N183 Chronic kidney disease, stage 3 unspecified: Secondary | ICD-10-CM

## 2018-06-11 DIAGNOSIS — M47816 Spondylosis without myelopathy or radiculopathy, lumbar region: Secondary | ICD-10-CM | POA: Diagnosis not present

## 2018-06-11 DIAGNOSIS — M170 Bilateral primary osteoarthritis of knee: Secondary | ICD-10-CM | POA: Diagnosis not present

## 2018-06-11 DIAGNOSIS — F17210 Nicotine dependence, cigarettes, uncomplicated: Secondary | ICD-10-CM | POA: Diagnosis not present

## 2018-06-11 DIAGNOSIS — E1151 Type 2 diabetes mellitus with diabetic peripheral angiopathy without gangrene: Secondary | ICD-10-CM | POA: Insufficient documentation

## 2018-06-11 DIAGNOSIS — Z79899 Other long term (current) drug therapy: Secondary | ICD-10-CM | POA: Diagnosis not present

## 2018-06-11 DIAGNOSIS — E669 Obesity, unspecified: Secondary | ICD-10-CM | POA: Insufficient documentation

## 2018-06-11 DIAGNOSIS — M25562 Pain in left knee: Secondary | ICD-10-CM

## 2018-06-11 NOTE — Progress Notes (Signed)
Safety precautions to be maintained throughout the outpatient stay will include: orient to surroundings, keep bed in low position, maintain call bell within reach at all times, provide assistance with transfer out of bed and ambulation.  

## 2018-06-11 NOTE — Patient Instructions (Signed)
____________________________________________________________________________________________  Preparing for Procedure with Sedation  Instructions: . Oral Intake: Do not eat or drink anything for at least 8 hours prior to your procedure. . Transportation: Public transportation is not allowed. Bring an adult driver. The driver must be physically present in our waiting room before any procedure can be started. . Physical Assistance: Bring an adult physically capable of assisting you, in the event you need help. This adult should keep you company at home for at least 6 hours after the procedure. . Blood Pressure Medicine: Take your blood pressure medicine with a sip of water the morning of the procedure. . Blood thinners: Notify our staff if you are taking any blood thinners. Depending on which one you take, there will be specific instructions on how and when to stop it. . Diabetics on insulin: Notify the staff so that you can be scheduled 1st case in the morning. If your diabetes requires high dose insulin, take only  of your normal insulin dose the morning of the procedure and notify the staff that you have done so. . Preventing infections: Shower with an antibacterial soap the morning of your procedure. . Build-up your immune system: Take 1000 mg of Vitamin C with every meal (3 times a day) the day prior to your procedure. . Antibiotics: Inform the staff if you have a condition or reason that requires you to take antibiotics before dental procedures. . Pregnancy: If you are pregnant, call and cancel the procedure. . Sickness: If you have a cold, fever, or any active infections, call and cancel the procedure. . Arrival: You must be in the facility at least 30 minutes prior to your scheduled procedure. . Children: Do not bring children with you. . Dress appropriately: Bring dark clothing that you would not mind if they get stained. . Valuables: Do not bring any jewelry or valuables.  Procedure  appointments are reserved for interventional treatments only. . No Prescription Refills. . No medication changes will be discussed during procedure appointments. . No disability issues will be discussed.  Reasons to call and reschedule or cancel your procedure: (Following these recommendations will minimize the risk of a serious complication.) . Surgeries: Avoid having procedures within 2 weeks of any surgery. (Avoid for 2 weeks before or after any surgery). . Flu Shots: Avoid having procedures within 2 weeks of a flu shots or . (Avoid for 2 weeks before or after immunizations). . Barium: Avoid having a procedure within 7-10 days after having had a radiological study involving the use of radiological contrast. (Myelograms, Barium swallow or enema study). . Heart attacks: Avoid any elective procedures or surgeries for the initial 6 months after a "Myocardial Infarction" (Heart Attack). . Blood thinners: It is imperative that you stop these medications before procedures. Let us know if you if you take any blood thinner.  . Infection: Avoid procedures during or within two weeks of an infection (including chest colds or gastrointestinal problems). Symptoms associated with infections include: Localized redness, fever, chills, night sweats or profuse sweating, burning sensation when voiding, cough, congestion, stuffiness, runny nose, sore throat, diarrhea, nausea, vomiting, cold or Flu symptoms, recent or current infections. It is specially important if the infection is over the area that we intend to treat. . Heart and lung problems: Symptoms that may suggest an active cardiopulmonary problem include: cough, chest pain, breathing difficulties or shortness of breath, dizziness, ankle swelling, uncontrolled high or unusually low blood pressure, and/or palpitations. If you are experiencing any of these symptoms, cancel   your procedure and contact your primary care physician for an evaluation.  Remember:   Regular Business hours are:  Monday to Thursday 8:00 AM to 4:00 PM  Provider's Schedule: Francisco Naveira, MD:  Procedure days: Tuesday and Thursday 7:30 AM to 4:00 PM  Bilal Lateef, MD:  Procedure days: Monday and Wednesday 7:30 AM to 4:00 PM ____________________________________________________________________________________________    

## 2018-06-11 NOTE — Progress Notes (Signed)
Patient's Name: Albert Figueroa  MRN: [id number]  Referring Provider: Dion Body, MD  DOB: 03/30/1956  PCP: Dion Body, MD  DOS: 06/11/2018  Note by: Gillis Santa, MD  Service setting: Ambulatory outpatient  Specialty: Interventional Pain Management  Location: [facility] (AMB) Pain Management Facility    Patient type: Established   Primary Reason(s) for Visit: Evaluation of chronic illnesses with exacerbation, or progression (Level of risk: moderate) CC: Back Pain (lower); Knee Pain (bilateral); Hip Pain (left); and Wrist Pain (right)  HPI  Albert Figueroa is a 62 y.o. year old, male patient, who comes today for a follow-up evaluation. He has CKD (chronic kidney disease) stage 3, GFR 30-59 ml/min (Albert Figueroa); Biceps tendinitis; Diabetes mellitus, type 2 (Albert Figueroa); Obesity (BMI 30-39.9); Tobacco use; Lumbar spondylosis; Left hip pain; and Chronic pain of both knees on their problem list. Albert Figueroa was last seen on 05/13/2018. His primarily concern today is the Back Pain (lower); Knee Pain (bilateral); Hip Pain (left); and Wrist Pain (right)  Pain Assessment: Location: Lower Back Radiating:   Onset: More than a month ago Duration: Chronic pain Quality: Constant, Throbbing, Aching Severity: 7 /10 (subjective, self-reported pain score)  Note: Reported level is inconsistent with clinical observations. Clinically the patient looks like a 3/10 A 3/10 is viewed as "Moderate" and described as significantly interfering with activities of daily living (ADL). It becomes difficult to feed, bathe, get dressed, get on and off the toilet or to perform personal hygiene functions. Difficult to get in and out of bed or a chair without assistance. Very distracting. With effort, it can be ignored when deeply involved in activities. Information on the proper use of the pain scale provided to the patient today. When using our objective Pain Scale, levels between 6 and 10/10 are said to belong in an emergency room, as it progressively  worsens from a 6/10, described as severely limiting, requiring emergency care not usually available at an outpatient pain management facility. At a 6/10 level, communication becomes difficult and requires great effort. Assistance to reach the emergency department may be required. Facial flushing and profuse sweating along with potentially dangerous increases in heart rate and blood pressure will be evident. Effect on ADL: "difficult to walk, must sit down a lot" Timing: Constant Modifying factors: sitting, meds BP: 117/64  HR: 67  Further details on both, my assessment(s), as well as the proposed treatment plan, please see below.  Patient follows up today to review his x-ray studies as well as discuss interventional pain plan.  No changes in his medical history since his last visit with me on 05/13/2018  Laboratory Chemistry  Inflammation Markers (CRP: Acute Phase) (ESR: Chronic Phase) No results found for: CRP, ESRSEDRATE, LATICACIDVEN                       Rheumatology Markers No results found for: RF, ANA, LABURIC, URICUR, LYMEIGGIGMAB, LYMEABIGMQN, HLAB27                      Renal Function Markers Lab Results  Component Value Date   BUN 9 10/06/2012   CREATININE 0.83 10/06/2012   GFRAA >60 10/06/2012   GFRNONAA >60 10/06/2012                             Hepatic Function Markers Lab Results  Component Value Date   AST 17 10/06/2012   ALT 25 10/06/2012   ALBUMIN  4.0 10/06/2012   ALKPHOS 154 (H) 10/06/2012                        Electrolytes Lab Results  Component Value Date   NA 137 10/06/2012   K 3.7 10/06/2012   CL 105 10/06/2012   CALCIUM 9.4 10/06/2012                        Neuropathy Markers No results found for: VITAMINB12, FOLATE, HGBA1C, HIV                      CNS Tests No results found for: SDES, GRAMSTAIN, CULT, COLORCSF, APPEARCSF, RBCCOUNTCSF, WBCCSF, POLYSCSF, LYMPHSCSF, EOSCSF, PROTEINCSF, GLUCCSF, JCVIRUS, CSFOLI, IGGCSF, IGGALB, IGGIND                       Bone Pathology Markers No results found for: [place], [id number], [id number], [id number], 25OHVITD1, 25OHVITD2, 25OHVITD3, TESTOFREE, TESTOSTERONE                       Coagulation Parameters Lab Results  Component Value Date   PLT 233 10/06/2012                        Cardiovascular Markers Lab Results  Component Value Date   HGB 15.5 10/06/2012   HCT 46.7 10/06/2012                         CA Markers No results found for: CEA, CA125, LABCA2                      Note: Lab results reviewed.  Recent Diagnostic Imaging Review  Shoulder-R DG:  Results for orders placed during the hospital encounter of 04/05/15  DG Shoulder Right   Narrative CLINICAL DATA:  Acute onset right shoulder pain.  No known injury.  EXAM: RIGHT SHOULDER - 2+ VIEW  COMPARISON:  None.  FINDINGS: There is no evidence of fracture or dislocation. There is no evidence of arthropathy or other focal bone abnormality. Soft tissues are unremarkable.  Mild degenerative spurring and chondrocalcinosis seen involving the acromioclavicular joint. No other significant bone abnormality identified.  IMPRESSION: No acute findings.  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   Electronically Signed   By: Earle Gell M.D.   On: 04/05/2015 19:53     Thoracic DG: No re Lumbar DG Bending views:  Results for orders placed during the hospital encounter of 05/13/18  DG Lumbar Spine Complete W/Bend   Narrative CLINICAL DATA:  Back pain.  EXAM: LUMBAR SPINE - COMPLETE WITH BENDING VIEWS  COMPARISON:  01/18/2005.  FINDINGS: Degenerative changes lumbar spine. No acute bony abnormality identified. No flexion or extension deformity noted. Aortoiliac atherosclerotic vascular calcification. Catheters noted over the abdomen.  IMPRESSION: 1. Diffuse degenerative change lumbar spine. No acute bony abnormality identified. No flexion or extension deformity noted. No acute bony  abnormality.  2.  Aortoiliac atherosclerotic vascular disease.   Electronically Signed   By: Marcello Moores  Register   On: 05/14/2018 06:43      Sacroiliac Joint Imaging: Sacroiliac Joint DG:  Results for orders placed during the hospital encounter of 05/13/18  DG Si Joints   Narrative CLINICAL DATA:  Back pain.  No known injury.  EXAM: BILATERAL SACROILIAC JOINTS - 3+ VIEW  COMPARISON:  2006.  FINDINGS:  Degenerative changes lumbar spine, both SI joints, and both hips. Diffuse osteopenia. No acute bony abnormality identified. Sclerotic density noted over the left iliac region consistent with bone island, no change from prior study of 01/18/2005. Sclerotic density noted the proximal left femur most consistent with a bone island.  IMPRESSION: Degenerative changes lumbar spine, both SI joints, both hips. No acute bony abnormality identified.   Electronically Signed   By: Marcello Moores  Register   On: 05/14/2018 06:38      Knee-R DG 1-2 views:  Results for orders placed during the hospital encounter of 05/13/18  DG Knee 1-2 Views Right   Narrative CLINICAL DATA:  Bilateral knee pain. No known injury. Prior knee surgery.  EXAM: RIGHT KNEE - 1-2 VIEW  COMPARISON:  Comparison made to prior MRI report of 07/31/2000.  FINDINGS: Total right knee replacement. Hardware intact. Anatomic alignment. No acute bony abnormality. Peripheral vascular calcification.  IMPRESSION: 1.  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  2.  Peripheral vascular disease.   Electronically Signed   By: Marcello Moores  Register   On: 05/14/2018 06:34    Knee-L DG 1-2 views:  Results for orders placed during the hospital encounter of 05/13/18  DG Knee 1-2 Views Left   Narrative CLINICAL DATA:  Bilateral knee pain. No known injury. Prior knee surgery.  EXAM: LEFT KNEE - 1-2 VIEW  COMPARISON:  No recent prior.  FINDINGS: Tricompartment degenerative change with  chondrocalcinosis. Postsurgical changes proximal tibia. No acute bony abnormality. No evidence of fracture. Peripheral vascular calcification noted.  IMPRESSION: 1. Degenerative change with chondrocalcinosis. Postsurgical changes proximal tibia. No acute bony abnormality.  2.  Peripheral vascular disease.   Electronically Signed   By: Marcello Moores  Register   On: 05/14/2018 06:29    K  Complexity Note: Imaging results reviewed. Results shared with Mr. Caldera, using Layman's terms.                   I have personally examined the images and I agree with the reported  findings. I find no additional pain-related pathology to add to the report.  Meds   Current Outpatient Medications:  .  atorvastatin (LIPITOR) 40 MG tablet, Take 40 mg by mouth daily., Disp: , Rfl:  .  divalproex (DEPAKOTE ER) 500 MG 24 hr tablet, Take 500 mg by mouth daily., Disp: , Rfl:  .  esomeprazole (NEXIUM) 20 MG capsule, Take 20 mg by mouth daily at 12 noon., Disp: , Rfl:  .  glipiZIDE (GLUCOTROL) 10 MG tablet, Take 10 mg by mouth Nightly., Disp: , Rfl:  .  insulin detemir (LEVEMIR) 100 UNIT/ML injection, Inject into the skin at bedtime., Disp: , Rfl:  .  lisinopril-hydrochlorothiazide (PRINZIDE,ZESTORETIC) 10-12.5 MG tablet, Take 1 tablet by mouth daily., Disp: , Rfl:  .  oxyCODONE (OXY IR/ROXICODONE) 5 MG immediate release tablet, Take 5 mg by mouth every 4 (four) hours as needed for severe pain., Disp: , Rfl:  .  oxyCODONE (ROXICODONE) 15 MG immediate release tablet, Take 15 mg by mouth every 6 (six) hours as needed for pain., Disp: , Rfl:  .  pregabalin (LYRICA) 50 MG capsule, 50 mg qhs x 2 weeks then increase to 50 mg twice daily, Disp: 60 capsule, Rfl: 1 .  senna-docusate (SENOKOT-S) 8.6-50 MG tablet, Take 1 tablet by mouth 2 (two) times daily., Disp: , Rfl:   ROS  Constitutional: Denies any fever or chills Gastrointestinal: No reported hemesis, hematochezia, vomiting, or acute GI distress Musculoskeletal:  Denies any  acute onset joint swelling, redness, loss of ROM, or weakness Neurological: No reported episodes of acute onset apraxia, aphasia, dysarthria, agnosia, amnesia, paralysis, loss of coordination, or loss of consciousness  Allergies  Mr. Farnan has No Known Allergies.  PFSH  Drug: Mr. Enyeart  reports that he does not use drugs. Alcohol:  reports that he does not drink alcohol. Tobacco:  reports that he has been smoking cigarettes. He has been smoking about 2.00 packs per day. He has never used smokeless tobacco. Medical:  has a past medical history of Arthritis, Diabetes mellitus without complication ([place]), Hypertension, Kidney disease, Osteoarthrosis, and Seizure ([place]). Surgical: Mr. Shukla  has a past surgical history that includes Knee surgery. Family: family history is not on file.  Constitutional Exam  General appearance: Well nourished, well developed, and well hydrated. In no apparent acute distress Vitals:   06/11/18 1120  BP: 117/64  Pulse: 67  Resp: 16  Temp: 98.2 F (36.8 C)  TempSrc: Oral  SpO2: 98%  Weight: 242 lb (109.8 kg)  Height: '5\' 11"'  (1.803 m)   BMI Assessment: Estimated body mass index is 33.75 kg/m as calculated from the following:   Height as of this encounter: '5\' 11"'  (1.803 m).   Weight as of this encounter: 242 lb (109.8 kg).  BMI interpretation table: BMI level Category Range association with higher incidence of chronic pain  <18 kg/m2 Underweight   18.5-24.9 kg/m2 Ideal body weight   25-29.9 kg/m2 Overweight Increased incidence by 20%  30-34.9 kg/m2 Obese (Class I) Increased incidence by 68%  35-39.9 kg/m2 Severe obesity (Class II) Increased incidence by 136%  >40 kg/m2 Extreme obesity (Class III) Increased incidence by 254%   Patient's current BMI Ideal Body weight  Body mass index is 33.75 kg/m. Ideal body weight: 75.3 kg (166 lb 0.1 oz) Adjusted ideal body weight: 89.1 kg (196 lb 6.5 oz)   BMI Readings from Last 4 Encounters:  06/11/18  33.75 kg/m  05/13/18 33.75 kg/m  04/05/15 32.78 kg/m   Wt Readings from Last 4 Encounters:  06/11/18 242 lb (109.8 kg)  05/13/18 242 lb (109.8 kg)  04/05/15 235 lb (106.6 kg)  Psych/Mental status: Alert, oriented x 3 (person, place, & time)       Eyes: PERLA Respiratory: No evidence of acute respiratory distress  Cervical Spine Area Exam  Skin & Axial Inspection: No masses, redness, edema, swelling, or associated skin lesions Alignment: Symmetrical Functional ROM: Unrestricted ROM      Stability: No instability detected Muscle Tone/Strength: Functionally intact. No obvious neuro-muscular anomalies detected. Sensory (Neurological): Unimpaired Palpation: No palpable anomalies              Upper Extremity (UE) Exam    Side: Right upper extremity  Side: Left upper extremity  Skin & Extremity Inspection: Skin color, temperature, and hair growth are WNL. No peripheral edema or cyanosis. No masses, redness, swelling, asymmetry, or associated skin lesions. No contractures.  Skin & Extremity Inspection: Skin color, temperature, and hair growth are WNL. No peripheral edema or cyanosis. No masses, redness, swelling, asymmetry, or associated skin lesions. No contractures.  Functional ROM: Unrestricted ROM          Functional ROM: Unrestricted ROM          Muscle Tone/Strength: Functionally intact. No obvious neuro-muscular anomalies detected.  Muscle Tone/Strength: Functionally intact. No obvious neuro-muscular anomalies detected.  Sensory (Neurological): Unimpaired          Sensory (Neurological): Unimpaired  Palpation: No palpable anomalies              Palpation: No palpable anomalies              Provocative Test(s):  Phalen's test: deferred Tinel's test: deferred Apley's scratch test (touch opposite shoulder):  Action 1 (Across chest): deferred Action 2 (Overhead): deferred Action 3 (LB reach): deferred   Provocative Test(s):  Phalen's test: deferred Tinel's test:  deferred Apley's scratch test (touch opposite shoulder):  Action 1 (Across chest): deferred Action 2 (Overhead): deferred Action 3 (LB reach): deferred    Thoracic Spine Area Exam  Skin & Axial Inspection: No masses, redness, or swelling Alignment: Symmetrical Functional ROM: Unrestricted ROM Stability: No instability detected Muscle Tone/Strength: Functionally intact. No obvious neuro-muscular anomalies detected. Sensory (Neurological): Unimpaired Muscle strength & Tone: No palpable anomalies  Lumbar Spine Area Exam  Skin & Axial Inspection: No masses, redness, or swelling Alignment: Symmetrical Functional ROM: Decreased ROM       Stability: No instability detected Muscle Tone/Strength: Functionally intact. No obvious neuro-muscular anomalies detected. Sensory (Neurological): Musculoskeletal pain pattern and dermatomal Palpation: No palpable anomalies       Provocative Tests: Hyperextension/rotation test: Positive bilaterally for facet joint pain. Lumbar quadrant test (Kemp's test): (+) bilaterally for facet joint pain. Lateral bending test: deferred today       Patrick's Maneuver: deferred today                   FABER test: deferred today                   S-I anterior distraction/compression test: deferred today         S-I lateral compression test: (+)   S-I arthralgia/arthropathy on the left S-I Thigh-thrust test: deferred today         S-I Gaenslen's test: deferred today          Gait & Posture Assessment  Ambulation: Patient ambulates using a cane Gait: Antalgic Posture: Difficulty with positional changes   Lower Extremity Exam    Side: Right lower extremity  Side: Left lower extremity  Stability: No instability observed          Stability: No instability observed          Skin & Extremity Inspection: Evidence of prior arthroplastic surgery  Skin & Extremity Inspection: Evidence of prior arthroplastic surgery  Functional ROM: Decreased ROM for knee joint           Functional ROM: Decreased ROM for knee joint          Muscle Tone/Strength: Functionally intact. No obvious neuro-muscular anomalies detected.  Muscle Tone/Strength: Functionally intact. No obvious neuro-muscular anomalies detected.  Sensory (Neurological): Arthropathic arthralgia  Sensory (Neurological): Arthropathic arthralgia  Palpation: No palpable anomalies  Palpation: No palpable anomalies     Assessment  Primary Diagnosis & Pertinent Problem List: The primary encounter diagnosis was Lumbar spondylosis. Diagnoses of Chronic left-sided low back pain with left-sided sciatica, Left hip pain, Chronic pain of both knees, Chronic pain syndrome, S/P VP shunt, CKD (chronic kidney disease) stage 3, GFR 30-59 ml/min (HCC), and Obesity (BMI 30-39.9) were also pertinent to this visit.  Status Diagnosis  Persistent Persistent Persistent 1. Lumbar spondylosis   2. Chronic left-sided low back pain with left-sided sciatica   3. Left hip pain   4. Chronic pain of both knees   5. Chronic pain syndrome   6. S/P VP shunt   7. CKD (chronic kidney disease)  stage 3, GFR 30-59 ml/min (HCC)   8. Obesity (BMI 30-39.9)     Problems updated and reviewed during this visit: Problem  Lumbar Spondylosis  Left Hip Pain  Chronic Pain of Both Knees   General Recommendations: The pain condition that the patient suffers from is best treated with a multidisciplinary approach that involves an increase in physical activity to prevent de-conditioning and worsening of the pain cycle, as well as psychological counseling (formal and/or informal) to address the co-morbid psychological affects of pain. Treatment will often involve judicious use of pain medications and interventional procedures to decrease the pain, allowing the patient to participate in the physical activity that will ultimately produce long-lasting pain reductions. The goal of the multidisciplinary approach is to return the patient to a higher  level of overall function and to restore their ability to perform activities of daily living.  Marylou Flesher has a history of greater than 3 months of moderate to severe pain which is resulted in functional impairment.  The patient has tried various conservative therapeutic options such as NSAIDs, Tylenol, muscle relaxants, physical therapy which was inadequately effective.  Patient's pain is predominantly axial with physical exam findings suggestive of facet arthropathy.  Lumbar facet medial branch nerve blocks were discussed with the patient.  Risks and benefits were reviewed.  Patient would like to proceed with bilateral L2, L3, L4, L5 medial branch nerve block.  Med management to continue with PCP-of note patient's urine drug screen was positive for both tramadol and hydrocodone which he is not prescribed.  When I asked him about this, he states that his partner/caregiver gave them to him with the night before because he was spending the night at her house.  I reinforced not sharing any ones medications and only taking medications that are prescribed.  Regards to the patient's bilateral knee osteoarthritis, we discussed bilateral genicular nerve blocks.  Risks and benefits were discussed.  Patient states that he would like to get his low back addressed before we start working on his knees.  I did start him on Lyrica 50 mg twice daily which is renally dosed at the last visit.  Patient can continue this medication.   Plan of Care   Lab-work, procedure(s), and/or referral(s): Orders Placed This Encounter  Procedures  . LUMBAR FACET(MEDIAL BRANCH NERVE BLOCK) MBNB    Pharmacological management options:  Continue medication management with PCP.  Interventional management options:  Considering:   Genicular nerve block Sacroiliac joint injection Lumbar radio frequency ablation    Provider-requested follow-up: Return in about 2 weeks (around 06/25/2018) for Procedure.  Future  Appointments  Date Time Provider [place]  06/17/2018 10:00 AM Gillis Santa, MD [facility] None   Time Note: Greater than 50% of the 25 minute(s) of face-to-face time spent with Mr. Hjort, was spent in counseling/coordination of care regarding: opioid tolerance, Mr. Costlow's primary cause of pain, the results of his recent test(s), the significance of each one oth the test(s) anomalies and it's corresponding characteristic pain pattern(s), the treatment plan, treatment alternatives and the risks and possible complications of proposed treatment.  Primary Care Physician: Dion Body, MD Location: [facility] Note by: Gillis Santa, M.D Date: 06/11/2018; Time: 3:29 PM  Patient Instructions  ____________________________________________________________________________________________  Preparing for Procedure with Sedation  Instructions: . Oral Intake: Do not eat or drink anything for at least 8 hours prior to your procedure. . Transportation: Public transportation is not allowed. Bring an adult driver. The driver must be  physically present in our waiting room before any procedure can be started. Marland Kitchen Physical Assistance: Bring an adult physically capable of assisting you, in the event you need help. This adult should keep you company at home for at least 6 hours after the procedure. . Blood Pressure Medicine: Take your blood pressure medicine with a sip of water the morning of the procedure. . Blood thinners: Notify our staff if you are taking any blood thinners. Depending on which one you take, there will be specific instructions on how and when to stop it. . Diabetics on insulin: Notify the staff so that you can be scheduled 1st case in the morning. If your diabetes requires high dose insulin, take only  of your normal insulin dose the morning of the procedure and notify the staff that you have done so. . Preventing infections: Shower with an antibacterial  soap the morning of your procedure. . Build-up your immune system: Take 1000 mg of Vitamin C with every meal (3 times a day) the day prior to your procedure. Marland Kitchen Antibiotics: Inform the staff if you have a condition or reason that requires you to take antibiotics before dental procedures. . Pregnancy: If you are pregnant, call and cancel the procedure. . Sickness: If you have a cold, fever, or any active infections, call and cancel the procedure. . Arrival: You must be in the facility at least 30 minutes prior to your scheduled procedure. . Children: Do not bring children with you. . Dress appropriately: Bring dark clothing that you would not mind if they get stained. . Valuables: Do not bring any jewelry or valuables.  Procedure appointments are reserved for interventional treatments only. Marland Kitchen No Prescription Refills. . No medication changes will be discussed during procedure appointments. . No disability issues will be discussed.  Reasons to call and reschedule or cancel your procedure: (Following these recommendations will minimize the risk of a serious complication.) . Surgeries: Avoid having procedures within 2 weeks of any surgery. (Avoid for 2 weeks before or after any surgery). . Flu Shots: Avoid having procedures within 2 weeks of a flu shots or . (Avoid for 2 weeks before or after immunizations). . Barium: Avoid having a procedure within 7-10 days after having had a radiological study involving the use of radiological contrast. (Myelograms, Barium swallow or enema study). . Heart attacks: Avoid any elective procedures or surgeries for the initial 6 months after a "Myocardial Infarction" (Heart Attack). . Blood thinners: It is imperative that you stop these medications before procedures. Let us know if you if you take any blood thinner.  . Infection: Avoid procedures during or within two weeks of an infection (including chest colds or gastrointestinal problems). Symptoms associated with  infections include: Localized redness, fever, chills, night sweats or profuse sweating, burning sensation when voiding, cough, congestion, stuffiness, runny nose, sore throat, diarrhea, nausea, vomiting, cold or Flu symptoms, recent or current infections. It is specially important if the infection is over the area that we intend to treat. Marland Kitchen Heart and lung problems: Symptoms that may suggest an active cardiopulmonary problem include: cough, chest pain, breathing difficulties or shortness of breath, dizziness, ankle swelling, uncontrolled high or unusually low blood pressure, and/or palpitations. If you are experiencing any of these symptoms, cancel your procedure and contact your primary care physician for an evaluation.  Remember:  Regular Business hours are:  Monday to Thursday 8:00 AM to 4:00 PM  Provider's Schedule: Milinda Pointer, MD:  Procedure days: Tuesday and Thursday 7:30 AM  to 4:00 PM  Gillis Santa, MD:  Procedure days: Monday and Wednesday 7:30 AM to 4:00 PM ____________________________________________________________________________________________

## 2018-06-17 ENCOUNTER — Ambulatory Visit
Admission: RE | Admit: 2018-06-17 | Discharge: 2018-06-17 | Disposition: A | Discharge status: Home / Self Care | Payer: Medicare Other | Source: Ambulatory Visit | Attending: Student in an Organized Health Care Education/Training Program | Admitting: Student in an Organized Health Care Education/Training Program

## 2018-06-17 ENCOUNTER — Ambulatory Visit (HOSPITAL_BASED_OUTPATIENT_CLINIC_OR_DEPARTMENT_OTHER): Payer: Medicare Other | Admitting: Student in an Organized Health Care Education/Training Program

## 2018-06-17 ENCOUNTER — Other Ambulatory Visit: Payer: Self-pay

## 2018-06-17 ENCOUNTER — Encounter: Payer: Self-pay | Admitting: Student in an Organized Health Care Education/Training Program

## 2018-06-17 VITALS — BP 116/75 | HR 63 | Temp 98.2°F | Resp 13 | Ht 71.0 in | Wt 245.0 lb

## 2018-06-17 DIAGNOSIS — M47816 Spondylosis without myelopathy or radiculopathy, lumbar region: Secondary | ICD-10-CM | POA: Diagnosis not present

## 2018-06-17 MED ORDER — DEXAMETHASONE SODIUM PHOSPHATE 10 MG/ML IJ SOLN
10.0000 mg | Freq: Once | INTRAMUSCULAR | Status: AC
  Administered 2018-06-17: 10 mg

## 2018-06-17 MED ORDER — DEXAMETHASONE SODIUM PHOSPHATE 10 MG/ML IJ SOLN
INTRAMUSCULAR | Status: AC
  Filled 2018-06-17: qty 1

## 2018-06-17 MED ORDER — ROPIVACAINE HCL 2 MG/ML IJ SOLN
INTRAMUSCULAR | Status: AC
  Filled 2018-06-17: qty 10

## 2018-06-17 MED ORDER — FENTANYL CITRATE (PF) 100 MCG/2ML IJ SOLN
25.0000 ug | INTRAMUSCULAR | Status: DC | PRN
  Administered 2018-06-17: 75 ug via INTRAVENOUS

## 2018-06-17 MED ORDER — LACTATED RINGERS IV SOLN
1000.0000 mL | Freq: Once | INTRAVENOUS | Status: AC
  Administered 2018-06-17: 1000 mL via INTRAVENOUS

## 2018-06-17 MED ORDER — LIDOCAINE HCL 2 % IJ SOLN
INTRAMUSCULAR | Status: AC
  Filled 2018-06-17: qty 20

## 2018-06-17 MED ORDER — FENTANYL CITRATE (PF) 100 MCG/2ML IJ SOLN
INTRAMUSCULAR | Status: AC
  Filled 2018-06-17: qty 2

## 2018-06-17 MED ORDER — LIDOCAINE HCL 2 % IJ SOLN
20.0000 mL | Freq: Once | INTRAMUSCULAR | Status: AC
  Administered 2018-06-17: 400 mg

## 2018-06-17 MED ORDER — ROPIVACAINE HCL 2 MG/ML IJ SOLN
10.0000 mL | Freq: Once | INTRAMUSCULAR | Status: AC
  Administered 2018-06-17: 10 mL

## 2018-06-17 NOTE — Patient Instructions (Signed)
____________________________________________________________________________________________  Pain Scale  Introduction: The pain score used by this practice is the Verbal Numerical Rating Scale (VNRS-11). This is an 11-point scale. It is for adults and children 10 years or older. There are significant differences in how the pain score is reported, used, and applied. Forget everything you learned in the past and learn this scoring system.  General Information: The scale should reflect your current level of pain. Unless you are specifically asked for the level of your worst pain, or your average pain. If you are asked for one of these two, then it should be understood that it is over the past 24 hours.  Basic Activities of Daily Living (ADL): Personal hygiene, dressing, eating, transferring, and using restroom.  Instructions: Most patients tend to report their level of pain as a combination of two factors, their physical pain and their psychosocial pain. This last one is also known as "suffering" and it is reflection of how physical pain affects you socially and psychologically. From now on, report them separately. From this point on, when asked to report your pain level, report only your physical pain. Use the following table for reference.  Pain Clinic Pain Levels (0-5/10)  Pain Level Score  Description  No Pain 0   Mild pain 1 Nagging, annoying, but does not interfere with basic activities of daily living (ADL). Patients are able to eat, bathe, get dressed, toileting (being able to get on and off the toilet and perform personal hygiene functions), transfer (move in and out of bed or a chair without assistance), and maintain continence (able to control bladder and bowel functions). Blood pressure and heart rate are unaffected. A normal heart rate for a healthy adult ranges from 60 to 100 bpm (beats per minute).   Mild to moderate pain 2 Noticeable and distracting. Impossible to hide from other  people. More frequent flare-ups. Still possible to adapt and function close to normal. It can be very annoying and may have occasional stronger flare-ups. With discipline, patients may get used to it and adapt.   Moderate pain 3 Interferes significantly with activities of daily living (ADL). It becomes difficult to feed, bathe, get dressed, get on and off the toilet or to perform personal hygiene functions. Difficult to get in and out of bed or a chair without assistance. Very distracting. With effort, it can be ignored when deeply involved in activities.   Moderately severe pain 4 Impossible to ignore for more than a few minutes. With effort, patients may still be able to manage work or participate in some social activities. Very difficult to concentrate. Signs of autonomic nervous system discharge are evident: dilated pupils (mydriasis); mild sweating (diaphoresis); sleep interference. Heart rate becomes elevated (>115 bpm). Diastolic blood pressure (lower number) rises above 100 mmHg. Patients find relief in laying down and not moving.   Severe pain 5 Intense and extremely unpleasant. Associated with frowning face and frequent crying. Pain overwhelms the senses.  Ability to do any activity or maintain social relationships becomes significantly limited. Conversation becomes difficult. Pacing back and forth is common, as getting into a comfortable position is nearly impossible. Pain wakes you up from deep sleep. Physical signs will be obvious: pupillary dilation; increased sweating; goosebumps; brisk reflexes; cold, clammy hands and feet; nausea, vomiting or dry heaves; loss of appetite; significant sleep disturbance with inability to fall asleep or to remain asleep. When persistent, significant weight loss is observed due to the complete loss of appetite and sleep deprivation.  Blood   pressure and heart rate becomes significantly elevated. Caution: If elevated blood pressure triggers a pounding headache  associated with blurred vision, then the patient should immediately seek attention at an urgent or emergency care unit, as these may be signs of an impending stroke.    Emergency Department Pain Levels (6-10/10)  Emergency Room Pain 6 Severely limiting. Requires emergency care and should not be seen or managed at an outpatient pain management facility. Communication becomes difficult and requires great effort. Assistance to reach the emergency department may be required. Facial flushing and profuse sweating along with potentially dangerous increases in heart rate and blood pressure will be evident.   Distressing pain 7 Self-care is very difficult. Assistance is required to transport, or use restroom. Assistance to reach the emergency department will be required. Tasks requiring coordination, such as bathing and getting dressed become very difficult.   Disabling pain 8 Self-care is no longer possible. At this level, pain is disabling. The individual is unable to do even the most "basic" activities such as walking, eating, bathing, dressing, transferring to a bed, or toileting. Fine motor skills are lost. It is difficult to think clearly.   Incapacitating pain 9 Pain becomes incapacitating. Thought processing is no longer possible. Difficult to remember your own name. Control of movement and coordination are lost.   The worst pain imaginable 10 At this level, most patients pass out from pain. When this level is reached, collapse of the autonomic nervous system occurs, leading to a sudden drop in blood pressure and heart rate. This in turn results in a temporary and dramatic drop in blood flow to the brain, leading to a loss of consciousness. Fainting is one of the body's self defense mechanisms. Passing out puts the brain in a calmed state and causes it to shut down for a while, in order to begin the healing process.    Summary: 1. Refer to this scale when providing [place] with your pain level. 2. Be  accurate and careful when reporting your pain level. This will help with your care. 3. Over-reporting your pain level will lead to loss of credibility. 4. Even a level of 1/10 means that there is pain and will be treated at our facility. 5. High, inaccurate reporting will be documented as "Symptom Exaggeration", leading to loss of credibility and suspicions of possible secondary gains such as obtaining more narcotics, or wanting to appear disabled, for fraudulent reasons. 6. Only pain levels of 5 or below will be seen at our facility. 7. Pain levels of 6 and above will be sent to the Emergency Department and the appointment cancelled. ____________________________________________________________________________________________  ____________________________________________________________________________________________  Post-Procedure Information  What to expect: Most procedures involve the use of a local anesthetic (numbing medicine), and a steroid (anti-inflammatory medicine).  The local anesthetics may cause temporary numbness and weakness of the legs or arms, depending on the location of the block. This numbness/weakness may last 4-6 hours, depending on the local anesthetic used. In rare instances, it can last up to 24 hours. While numb, you must be very careful not to injure the extremity.  After any procedure, you could expect the pain to get better within 15-20 minutes. This relief is temporary and may last 4-6 hours. Once the local anesthetics wears off, you could experience discomfort, possibly more than usual, for up to 10 (ten) days. In the case of radiofrequencies, it may last up to 6 weeks. Surgeries may take up to 8 weeks for the healing process. The discomfort is  due to the irritation caused by needles going through skin and muscle. To minimize the discomfort, we recommend using ice the first day, and heat from then on. The ice should be applied for 15 minutes on, and 15 minutes off.  Keep repeating this cycle until bedtime. Avoid applying the ice directly to the skin, to prevent frostbite. Heat should be used daily, until the pain improves (4-10 days). Be careful not to burn yourself.  Occasionally you may experience muscle spasms or cramps. These occur as a consequence of the irritation caused by the needle sticks to the muscle and the blood that will inevitably be lost into the surrounding muscle tissue. Blood tends to be very irritating to tissues, which tend to react by going into spasm. These spasms may start the same day of your procedure, but they may also take days to develop. This late onset type of spasm or cramp is usually caused by electrolyte imbalances triggered by the steroids, at the level of the kidney. Cramps and spasms tend to respond well to muscle relaxants, multivitamins (some are triggered by the procedure, but may have their origins in vitamin deficiencies), and "Gatorade", or any sports drinks that can replenish any electrolyte imbalances. (If you are a diabetic, ask your pharmacist to get you a sugar-free brand.) Warm showers or baths may also be helpful. Stretching exercises are highly recommended.  General Instructions:  Be alert for signs of possible infection: redness, swelling, heat, red streaks, elevated temperature, and/or fever. These typically appear 4 to 6 days after the procedure. Immediately notify your doctor if you experience unusual bleeding, difficulty breathing, or loss of bowel or bladder control. If you experience increased pain, do not increase your pain medicine intake, unless instructed by your pain physician.  Post-Procedure Care:  Be careful in moving about. Muscle spasms in the area of the injection may occur. Applying ice or heat to the area is often helpful. The incidence of spinal headaches after epidural injections ranges between 1.4% and 6%. If you develop a headache that does not seem to respond to conservative therapy, please let  your physician know. This can be treated with an epidural blood patch.   Post-procedure numbness or redness is to be expected, however it should average 4 to 6 hours. If numbness and weakness of your extremities begins to develop 4 to 6 hours after your procedure, and is felt to be progressing and worsening, immediately contact your physician.    Diet:  If you experience nausea, do not eat until this sensation goes away. If you had a "Stellate Ganglion Block" for upper extremity "Reflex Sympathetic Dystrophy", do not eat or drink until your hoarseness goes away. In any case, always start with liquids first and if you tolerate them well, then slowly progress to more solid foods.  Activity:  For the first 4 to 6 hours after the procedure, use caution in moving about as you may experience numbness and/or weakness. Use caution in cooking, using household electrical appliances, and climbing steps.  If you need to reach your Doctor call our office: [phone] Monday-Thursday 8:00 am - 4:00 PM    Fridays: Closed      In case of an emergency: In case of emergency, call 911 or go to the nearest emergency room and have the physician there call us.  Interpretation of Procedure Every nerve block has two components: a diagnostic component, and a treatment component. Unrealistic expectations are the most common causes of "perceived failure".  In  a perfect world, a single nerve block should be able to completely and permanently eliminate the pain. Sadly, the world is not perfect.  Most pain management nerve blocks are performed using local anesthetics and steroids. Steroids are responsible for any long-term benefit that you may experience. Their purpose is to decrease any chronic swelling that may exist in the area. Steroids begin to work immediately after being injected. However, most patients will not experience any benefits until 5 to 10 days after the injection, when the swelling has come down to the  point where they can tell a difference. Steroids will only help if there is swelling to be treated. As such, they can assist with the diagnosis. If effective, they suggest an inflammatory component to the pain, and if ineffective, they rule out inflammation as the main cause or component of the problem. If the problem is one of mechanical compression, you will get no benefit from those steroids.   In the case of local anesthetics, they have a crucial role in the diagnosis of your condition. Most will begin to work within15 to 20 minutes after injection. The duration will depend on the type used (short- vs. Long-acting). It is of outmost importance that patients keep tract of their pain, after the procedure. To assist with this matter, a "Post-procedure Pain Diary" is provided. Make sure to complete it and to bring it back to your follow-up appointment.  As long as the patient keeps accurate, detailed records of their symptoms after every procedure, and returns to have those interpreted, every procedure will provide [place] with invaluable information. Even a block that does not provide the patient with any relief, will always provide [place] with information about the mechanism and the origin of the pain. The only time a nerve block can be considered a waste of time is when patients do not keep track of the results, or do not keep their post-procedure appointment.  Reporting the results back to your physician The Pain Score  Pain is a subjective complaint. It cannot be seen, touched, or measured. We depend entirely on the patient's report of the pain in order to assess your condition and treatment. To evaluate the pain, we use a pain scale, where "0" means "No Pain", and a "10" is "the worst possible pain that you can even imagine" (i.e. something like been eaten alive by a shark or being torn apart by a lion).   You will frequently be asked to rate your pain. Please be as accurate, remember that medical decisions  will be based on your responses. Please do not rate your pain above a 10. Doing so is actually interpreted as "symptom magnification" (exaggeration), as well as lack of understanding with regards to the scale. To put this into perspective, when you tell us that your pain is at a 10 (ten), what you are saying is that there is nothing we can do to make this pain any worse. (Carefully think about that.)  Call if:  You experience numbness and weakness that gets worse with time, as opposed to wearing off.  New onset bowel or bladder incontinence. (This applies to Spinal procedures only)  Emergency Numbers:  [facility] business hours (Monday - Thursday, 8:00 AM - 4:00 PM) (Friday, 9:00 AM - 12:00 Noon): [phone]  After hours: [phone] ____________________________________________________________________________________________

## 2018-06-17 NOTE — Progress Notes (Signed)
Safety precautions to be maintained throughout the outpatient stay will include: orient to surroundings, keep bed in low position, maintain call bell within reach at all times, provide assistance with transfer out of bed and ambulation.  

## 2018-06-17 NOTE — Progress Notes (Signed)
Patient's Name: Albert Figueroa  MRN: [id number]  Referring Provider: Dion Body, MD  DOB: [birth year]  PCP: Dion Body, MD  DOS: 06/17/2018  Note by: Gillis Santa, MD  Service setting: Ambulatory outpatient  Specialty: Interventional Pain Management  Patient type: Established  Location: [facility] (AMB) Pain Management Facility  Visit type: Interventional Procedure   Primary Reason for Visit: Interventional Pain Management Treatment. CC: Procedure  Procedure:          Anesthesia, Analgesia, Anxiolysis:  Type: Lumbar Facet, Medial Branch Block(s) #1  Primary Purpose: Diagnostic Region: Posterolateral Lumbosacral Spine Level: L2, L3, L4, L5, Medial Branch Level(s). Injecting these levels blocks the L3-4, L4-5, lumbar facet joints. Laterality: Bilateral  Type: Moderate (Conscious) Sedation combined with Local Anesthesia Indication(s): Analgesia and Anxiety Route: Intravenous (IV) IV Access: Secured Sedation: Meaningful verbal contact was maintained at all times during the procedure  Local Anesthetic: Lidocaine 1-2%   Indications: 1. Lumbar spondylosis    Pain Score: Pre-procedure: 5 /10 Post-procedure: 3 /10  Pre-op Assessment:  Albert Figueroa is a 62 y.o. (year old), male patient, seen today for interventional treatment. He  has a past surgical history that includes Knee surgery. Albert Figueroa has a current medication list which includes the following prescription(s): atorvastatin, divalproex, esomeprazole, glipizide, insulin detemir, lisinopril-hydrochlorothiazide, oxycodone, oxycodone, pregabalin, and senna-docusate, and the following Facility-Administered Medications: fentanyl. His primarily concern today is the Procedure  Initial Vital Signs:  Pulse/HCG Rate: 63ECG Heart Rate: 66 Temp: 98.2 F (36.8 C) Resp: 18 BP: 118/71 SpO2: 99 %  BMI: Estimated body mass index is 34.17 kg/m as calculated from the following:   Height as of this encounter: 5\' 11"  (1.803 m).   Weight as of this  encounter: 245 lb (111.1 kg).  Risk Assessment: Allergies: Reviewed. He has No Known Allergies.  Allergy Precautions: None required Coagulopathies: Reviewed. None identified.  Blood-thinner therapy: None at this time Active Infection(s): Reviewed. None identified. Albert Figueroa is afebrile  Site Confirmation: Albert Figueroa was asked to confirm the procedure and laterality before marking the site Procedure checklist: Completed Consent: Before the procedure and under the influence of no sedative(s), amnesic(s), or anxiolytics, the patient was informed of the treatment options, risks and possible complications. To fulfill our ethical and legal obligations, as recommended by the American Medical Association's Code of Ethics, I have informed the patient of my clinical impression; the nature and purpose of the treatment or procedure; the risks, benefits, and possible complications of the intervention; the alternatives, including doing nothing; the risk(s) and benefit(s) of the alternative treatment(s) or procedure(s); and the risk(s) and benefit(s) of doing nothing. The patient was provided information about the general risks and possible complications associated with the procedure. These may include, but are not limited to: failure to achieve desired goals, infection, bleeding, organ or nerve damage, allergic reactions, paralysis, and death. In addition, the patient was informed of those risks and complications associated to Spine-related procedures, such as failure to decrease pain; infection (i.e.: Meningitis, epidural or intraspinal abscess); bleeding (i.e.: epidural hematoma, subarachnoid hemorrhage, or any other type of intraspinal or peri-dural bleeding); organ or nerve damage (i.e.: Any type of peripheral nerve, nerve root, or spinal cord injury) with subsequent damage to sensory, motor, and/or autonomic systems, resulting in permanent pain, numbness, and/or weakness of one or several areas of the body;  allergic reactions; (i.e.: anaphylactic reaction); and/or death. Furthermore, the patient was informed of those risks and complications associated with the medications. These include, but are not limited to: allergic  reactions (i.e.: anaphylactic or anaphylactoid reaction(s)); adrenal axis suppression; blood sugar elevation that in diabetics may result in ketoacidosis or comma; water retention that in patients with history of congestive heart failure may result in shortness of breath, pulmonary edema, and decompensation with resultant heart failure; weight gain; swelling or edema; medication-induced neural toxicity; particulate matter embolism and blood vessel occlusion with resultant organ, and/or nervous system infarction; and/or aseptic necrosis of one or more joints. Finally, the patient was informed that Medicine is not an exact science; therefore, there is also the possibility of unforeseen or unpredictable risks and/or possible complications that may result in a catastrophic outcome. The patient indicated having understood very clearly. We have given the patient no guarantees and we have made no promises. Enough time was given to the patient to ask questions, all of which were answered to the patient's satisfaction. Albert Figueroa has indicated that he wanted to continue with the procedure. Attestation: I, the ordering provider, attest that I have discussed with the patient the benefits, risks, side-effects, alternatives, likelihood of achieving goals, and potential problems during recovery for the procedure that I have provided informed consent. Date  Time: 06/17/2018  9:45 AM  Pre-Procedure Preparation:  Monitoring: As per clinic protocol. Respiration, ETCO2, SpO2, BP, heart rate and rhythm monitor placed and checked for adequate function Safety Precautions: Patient was assessed for positional comfort and pressure points before starting the procedure. Time-out: I initiated and conducted the "Time-out"  before starting the procedure, as per protocol. The patient was asked to participate by confirming the accuracy of the "Time Out" information. Verification of the correct person, site, and procedure were performed and confirmed by me, the nursing staff, and the patient. "Time-out" conducted as per Joint Commission's Universal Protocol (UP.01.01.01). Time: 1108  Description of Procedure:          Position: Prone Laterality: Bilateral. The procedure was performed in identical fashion on both sides. Levels:  L2, L3, L4, L5,  Medial Branch Level(s) Area Prepped: Posterior Lumbosacral Region Prepping solution: ChloraPrep (2% chlorhexidine gluconate and 70% isopropyl alcohol) Safety Precautions: Aspiration looking for blood return was conducted prior to all injections. At no point did we inject any substances, as a needle was being advanced. Before injecting, the patient was told to immediately notify me if he was experiencing any new onset of "ringing in the ears, or metallic taste in the mouth". No attempts were made at seeking any paresthesias. Safe injection practices and needle disposal techniques used. Medications properly checked for expiration dates. SDV (single dose vial) medications used. After the completion of the procedure, all disposable equipment used was discarded in the proper designated medical waste containers. Local Anesthesia: Protocol guidelines were followed. The patient was positioned over the fluoroscopy table. The area was prepped in the usual manner. The time-out was completed. The target area was identified using fluoroscopy. A 12-in long, straight, sterile hemostat was used with fluoroscopic guidance to locate the targets for each level blocked. Once located, the skin was marked with an approved surgical skin marker. Once all sites were marked, the skin (epidermis, dermis, and hypodermis), as well as deeper tissues (fat, connective tissue and muscle) were infiltrated with a small  amount of a short-acting local anesthetic, loaded on a 10cc syringe with a 25G, 1.5-in  Needle. An appropriate amount of time was allowed for local anesthetics to take effect before proceeding to the next step. Local Anesthetic: Lidocaine 2.0% The unused portion of the local anesthetic was discarded in the proper  designated containers. Technical explanation of process:   L2 Medial Branch Nerve Block (MBB): The target area for the L2 medial branch is at the junction of the postero-lateral aspect of the superior articular process and the superior, posterior, and medial edge of the transverse process of L3. Under fluoroscopic guidance, a Quincke needle was inserted until contact was made with os over the superior postero-lateral aspect of the pedicular shadow (target area). After negative aspiration for blood, 74mL of the nerve block solution was injected without difficulty or complication. The needle was removed intact. L3 Medial Branch Nerve Block (MBB): The target area for the L3 medial branch is at the junction of the postero-lateral aspect of the superior articular process and the superior, posterior, and medial edge of the transverse process of L4. Under fluoroscopic guidance, a Quincke needle was inserted until contact was made with os over the superior postero-lateral aspect of the pedicular shadow (target area). After negative aspiration for blood, 1 mL of the nerve block solution was injected without difficulty or complication. The needle was removed intact. L4 Medial Branch Nerve Block (MBB): The target area for the L4 medial branch is at the junction of the postero-lateral aspect of the superior articular process and the superior, posterior, and medial edge of the transverse process of L5. Under fluoroscopic guidance, a Quincke needle was inserted until contact was made with os over the superior postero-lateral aspect of the pedicular shadow (target area). After negative aspiration for blood, 79mL of  the nerve block solution was injected without difficulty or complication. The needle was removed intact. L5 Medial Branch Nerve Block (MBB): The target area for the L5 medial branch is at the junction of the postero-lateral aspect of the superior articular process and the superior, posterior, and medial edge of the sacral ala. Under fluoroscopic guidance, a Quincke needle was inserted until contact was made with os over the superior postero-lateral aspect of the pedicular shadow (target area). After negative aspiration for blood, 66mL of the nerve block solution was injected without difficulty or complication. The needle was removed intact.  Procedural Needles: 22-gauge, 3.5-inch, Quincke needles used for all levels. Nerve block solution: 10 cc solution consisting of 9 cc of 0.2% Ropivacaine and 1 cc of Decadron 10 mg/cc.  1 cc injected at each level above bilaterally.  The unused portion of the solution was discarded in the proper designated containers.  Once the entire procedure was completed, the treated area was cleaned, making sure to leave some of the prepping solution back to take advantage of its long term bactericidal properties.   Illustration of the posterior view of the lumbar spine and the posterior neural structures. Laminae of L2 through S1 are labeled. DPRL5, dorsal primary ramus of L5; DPRS1, dorsal primary ramus of S1; DPR3, dorsal primary ramus of L3; FJ, facet (zygapophyseal) joint L3-L4; I, inferior articular process of L4; LB1, lateral branch of dorsal primary ramus of L1; IAB, inferior articular branches from L3 medial branch (supplies L4-L5 facet joint); IBP, intermediate branch plexus; MB3, medial branch of dorsal primary ramus of L3; NR3, third lumbar nerve root; S, superior articular process of L5; SAB, superior articular branches from L4 (supplies L4-5 facet joint also); TP3, transverse process of L3.  Vitals:   06/17/18 1123 06/17/18 1128 06/17/18 1138 06/17/18 1148  BP: (!)  152/62 (!) 150/72 (!) 145/84 116/75  Pulse:      Resp: 19 12 14 13   Temp:      SpO2: 98% 99% 99% 98%  Weight:      Height:        Start Time: 1108 hrs. End Time: 1127 hrs.  Imaging Guidance (Spinal):          Type of Imaging Technique: Fluoroscopy Guidance (Spinal) Indication(s): Assistance in needle guidance and placement for procedures requiring needle placement in or near specific anatomical locations not easily accessible without such assistance. Exposure Time: Please see nurses notes. Contrast: None used. Fluoroscopic Guidance: I was personally present during the use of fluoroscopy. "Tunnel Vision Technique" used to obtain the best possible view of the target area. Parallax error corrected before commencing the procedure. "Direction-depth-direction" technique used to introduce the needle under continuous pulsed fluoroscopy. Once target was reached, antero-posterior, oblique, and lateral fluoroscopic projection used confirm needle placement in all planes. Images permanently stored in EMR. Interpretation: No contrast injected. I personally interpreted the imaging intraoperatively. Adequate needle placement confirmed in multiple planes. Permanent images saved into the patient's record.  Antibiotic Prophylaxis:   Anti-infectives (From admission, onward)   None     Indication(s): None identified  Post-operative Assessment:  Post-procedure Vital Signs:  Pulse/HCG Rate: 63(!) 59 Temp: 98.2 F (36.8 C) Resp: 13 BP: 116/75 SpO2: 98 %  EBL: None  Complications: No immediate post-treatment complications observed by team, or reported by patient.  Note: The patient tolerated the entire procedure well. A repeat set of vitals were taken after the procedure and the patient was kept under observation following institutional policy, for this type of procedure. Post-procedural neurological assessment was performed, showing return to baseline, prior to discharge. The patient was provided  with post-procedure discharge instructions, including a section on how to identify potential problems. Should any problems arise concerning this procedure, the patient was given instructions to immediately contact us, at any time, without hesitation. In any case, we plan to contact the patient by telephone for a follow-up status report regarding this interventional procedure.  Comments:  No additional relevant information. 5 out of 5 strength bilateral lower extremity: Plantar flexion, dorsiflexion, knee flexion, knee extension.  Plan of Care    Imaging Orders     DG C-Arm 1-60 Min-No Report Procedure Orders    No procedure(s) ordered today    Medications ordered for procedure: Meds ordered this encounter  Medications  . lactated ringers infusion 1,000 mL  . ropivacaine (PF) 2 mg/mL (0.2%) (NAROPIN) injection 10 mL  . lidocaine (XYLOCAINE) 2 % (with pres) injection 400 mg  . dexamethasone (DECADRON) injection 10 mg  . dexamethasone (DECADRON) injection 10 mg  . fentaNYL (SUBLIMAZE) injection 25-100 mcg    Make sure Narcan is available in the pyxis when using this medication. In the event of respiratory depression (RR< 8/min): Titrate NARCAN (naloxone) in increments of 0.1 to 0.2 mg IV at 2-3 minute intervals, until desired degree of reversal.   Medications administered: We administered lactated ringers, ropivacaine (PF) 2 mg/mL (0.2%), lidocaine, dexamethasone, dexamethasone, and fentaNYL.  See the medical record for exact dosing, route, and time of administration.  New Prescriptions   No medications on file   Disposition: Discharge home  Discharge Date & Time: 06/17/2018; 1153 hrs.   Physician-requested Follow-up: Return in about 5 weeks (around 07/22/2018) for Post Procedure Evaluation.  Future Appointments  Date Time Provider [place]  07/22/2018  2:15 PM Gillis Santa, MD [facility] None   Primary Care Physician: Dion Body, MD Location: [facility]  Pain Management Facility Note by: Gillis Santa, MD Date: 06/17/2018; Time: 1:11 PM  Disclaimer:  Medicine is  not an Chief Strategy Officer. The only guarantee in medicine is that nothing is guaranteed. It is important to note that the decision to proceed with this intervention was based on the information collected from the patient. The Data and conclusions were drawn from the patient's questionnaire, the interview, and the physical examination. Because the information was provided in large part by the patient, it cannot be guaranteed that it has not been purposely or unconsciously manipulated. Every effort has been made to obtain as much relevant data as possible for this evaluation. It is important to note that the conclusions that lead to this procedure are derived in large part from the available data. Always take into account that the treatment will also be dependent on availability of resources and existing treatment guidelines, considered by other Pain Management Practitioners as being common knowledge and practice, at the time of the intervention. For Medico-Legal purposes, it is also important to point out that variation in procedural techniques and pharmacological choices are the acceptable norm. The indications, contraindications, technique, and results of the above procedure should only be interpreted and judged by a Board-Certified Interventional Pain Specialist with extensive familiarity and expertise in the same exact procedure and technique.

## 2018-06-18 ENCOUNTER — Telehealth: Payer: Self-pay | Admitting: *Deleted

## 2018-06-18 NOTE — Telephone Encounter (Signed)
No problems post procedure. 

## 2018-06-29 ENCOUNTER — Encounter: Payer: Self-pay | Admitting: Student in an Organized Health Care Education/Training Program

## 2018-06-29 ENCOUNTER — Ambulatory Visit: Payer: Medicare Other | Admitting: Student in an Organized Health Care Education/Training Program

## 2018-07-22 ENCOUNTER — Ambulatory Visit: Payer: Medicare Other | Admitting: Student in an Organized Health Care Education/Training Program

## 2018-09-08 DIAGNOSIS — M533 Sacrococcygeal disorders, not elsewhere classified: Secondary | ICD-10-CM | POA: Insufficient documentation

## 2018-09-15 DIAGNOSIS — M542 Cervicalgia: Secondary | ICD-10-CM | POA: Insufficient documentation

## 2020-02-03 DIAGNOSIS — Z0000 Encounter for general adult medical examination without abnormal findings: Secondary | ICD-10-CM | POA: Insufficient documentation

## 2020-02-09 ENCOUNTER — Telehealth: Payer: Self-pay | Admitting: *Deleted

## 2020-02-09 NOTE — Telephone Encounter (Signed)
Received referral for low dose lung cancer screening CT scan. Message left at phone number listed in EMR for patient to call me back to facilitate scheduling scan.  

## 2020-02-17 ENCOUNTER — Telehealth: Payer: Self-pay | Admitting: *Deleted

## 2020-02-17 DIAGNOSIS — Z122 Encounter for screening for malignant neoplasm of respiratory organs: Secondary | ICD-10-CM

## 2020-02-17 DIAGNOSIS — Z87891 Personal history of nicotine dependence: Secondary | ICD-10-CM

## 2020-02-17 NOTE — Telephone Encounter (Signed)
Received referral for initial lung cancer screening scan. Contacted patient and obtained smoking history,(current, 96 pack year) as well as answering questions related to screening process. Patient denies signs of lung cancer such as weight loss or hemoptysis. Patient denies comorbidity that would prevent curative treatment if lung cancer were found. Patient is scheduled for shared decision making visit and CT scan on 02/22/20 at 145pm.

## 2020-02-22 ENCOUNTER — Encounter: Payer: Self-pay | Admitting: Oncology

## 2020-02-22 ENCOUNTER — Other Ambulatory Visit: Payer: Self-pay

## 2020-02-22 ENCOUNTER — Ambulatory Visit
Admission: RE | Admit: 2020-02-22 | Discharge: 2020-02-22 | Disposition: A | Discharge status: Home / Self Care | Payer: Medicare Other | Source: Ambulatory Visit | Attending: Oncology | Admitting: Oncology

## 2020-02-22 ENCOUNTER — Inpatient Hospital Stay: Payer: Medicare Other | Attending: Oncology | Admitting: Oncology

## 2020-02-22 DIAGNOSIS — F17210 Nicotine dependence, cigarettes, uncomplicated: Secondary | ICD-10-CM | POA: Diagnosis not present

## 2020-02-22 DIAGNOSIS — Z87891 Personal history of nicotine dependence: Secondary | ICD-10-CM

## 2020-02-22 DIAGNOSIS — Z122 Encounter for screening for malignant neoplasm of respiratory organs: Secondary | ICD-10-CM | POA: Diagnosis present

## 2020-02-22 IMAGING — CT CT CHEST LUNG CANCER SCREENING LOW DOSE W/O CM
2 of 5 series · 15 of 40 positions shown, 18 images · non-contrast
Comparison: None.

CLINICAL DATA: 64-year-old male with 96 pack-year history of
smoking. Lung cancer screening.

EXAM:
CT CHEST WITHOUT CONTRAST LOW-DOSE FOR LUNG CANCER SCREENING
TECHNIQUE: Multidetector CT imaging of the chest was performed following the
standard protocol without IV contrast.

[Series 4: lung 1.00 · axial · 0.76mm/px · z∈[-1186,-879]mm · 12 of 339 slices shown, 15 images]
[im 16/339  mediastinal]
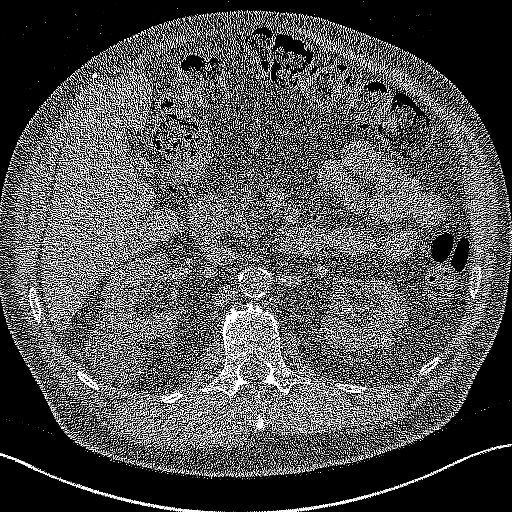
[im 16/339  lung]
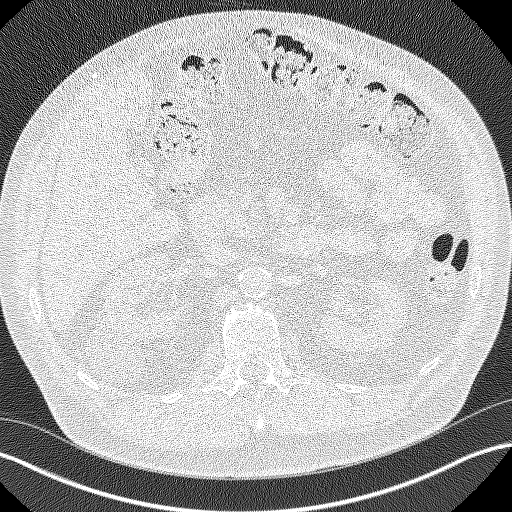
[im 47/339  lung]
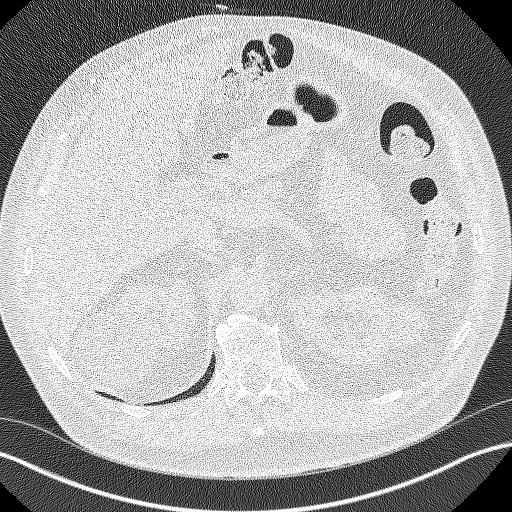
[im 77/339  lung]
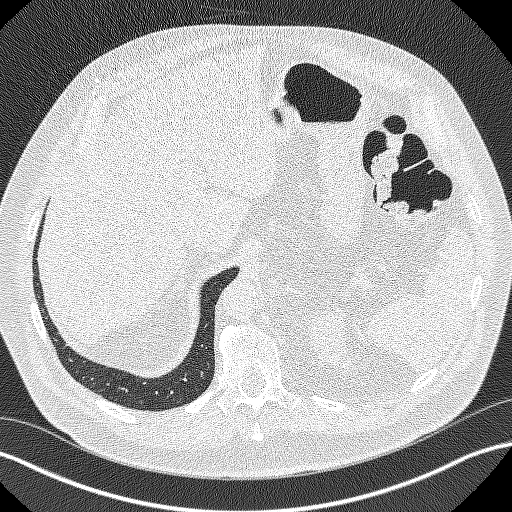
[im 108/339  lung]
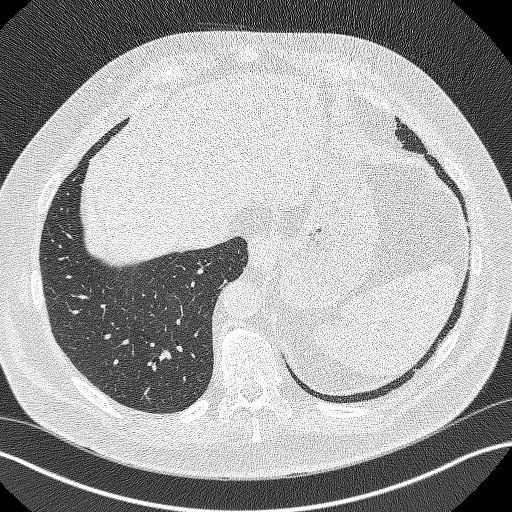
[im 123/339  mediastinal]
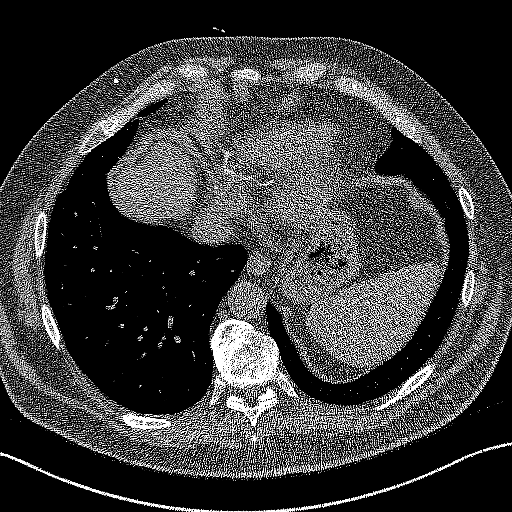
[im 123/339  lung]
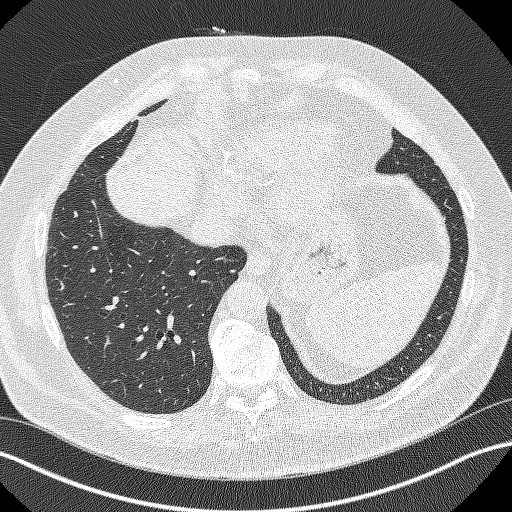
[im 154/339  lung]
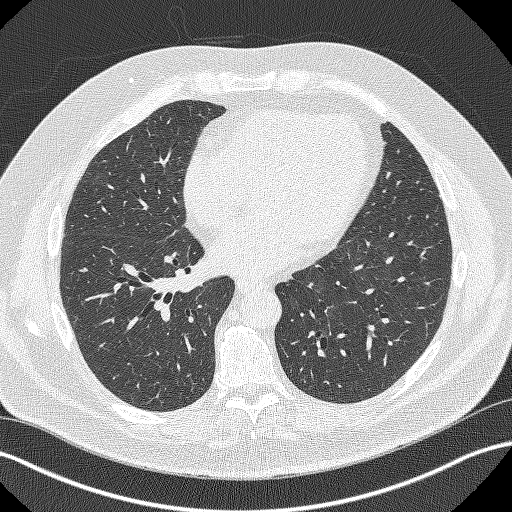
[im 185/339  lung]
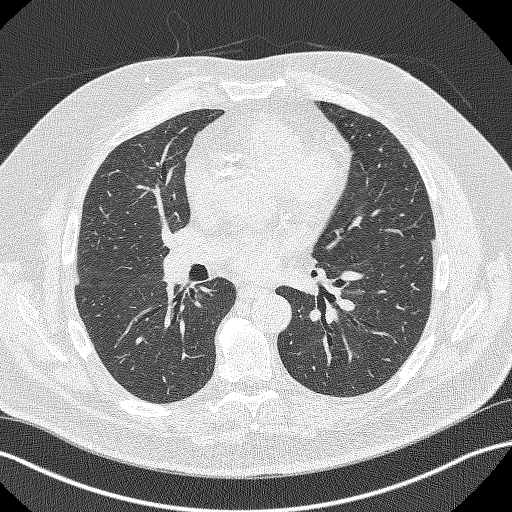
[im 216/339  lung]
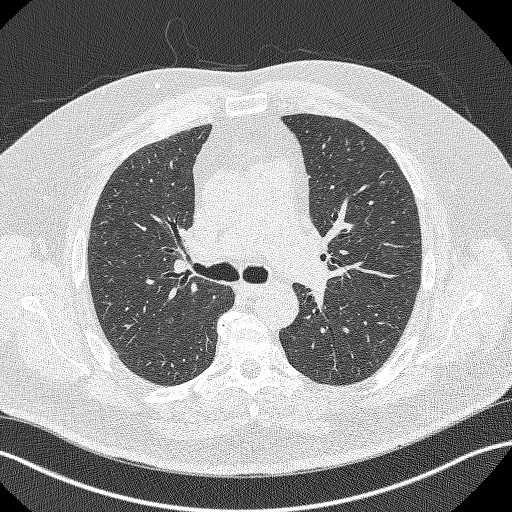
[im 231/339  mediastinal]
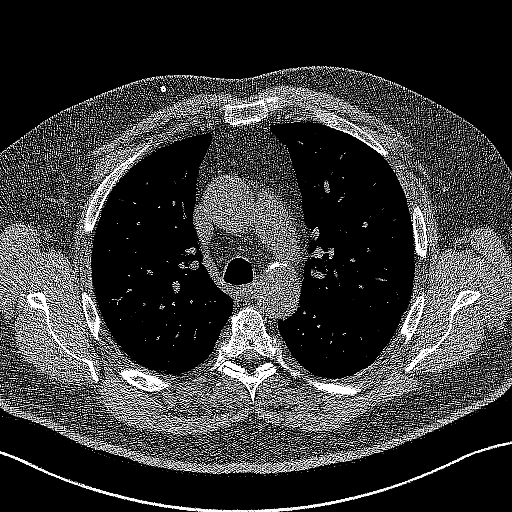
[im 231/339  lung]
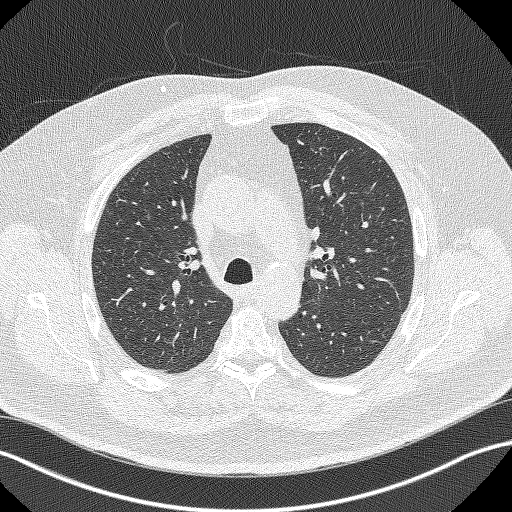
[im 262/339  lung]
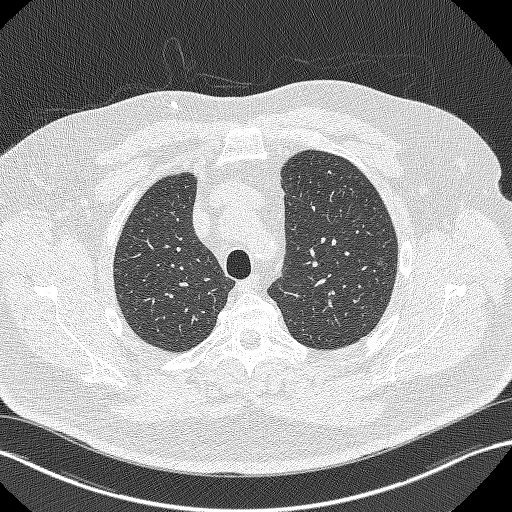
[im 292/339  lung]
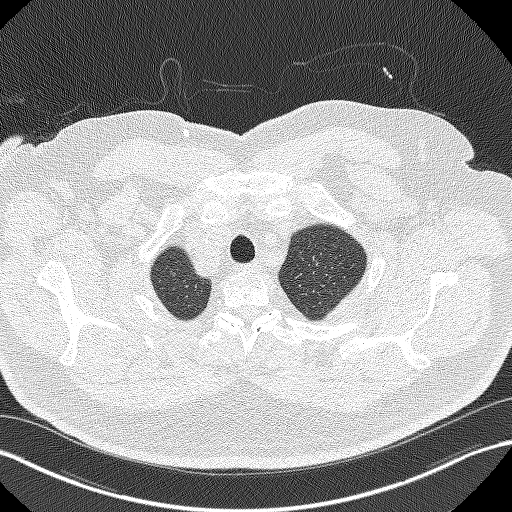
[im 323/339  lung]
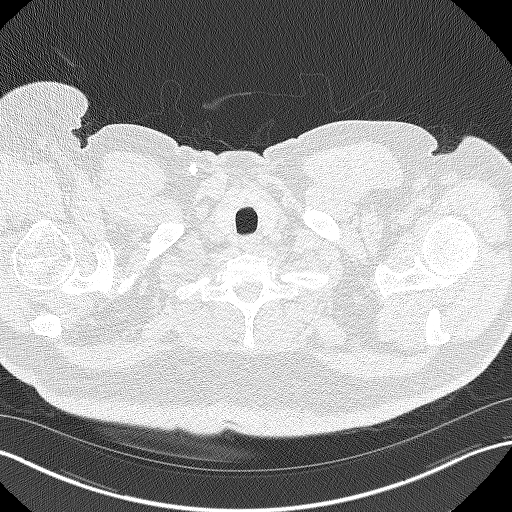

[Series 5: coronals lung 1.00 cor · coronal · 0.66mm/px · 3 of 355 slices shown]
[im 71/355  lung]
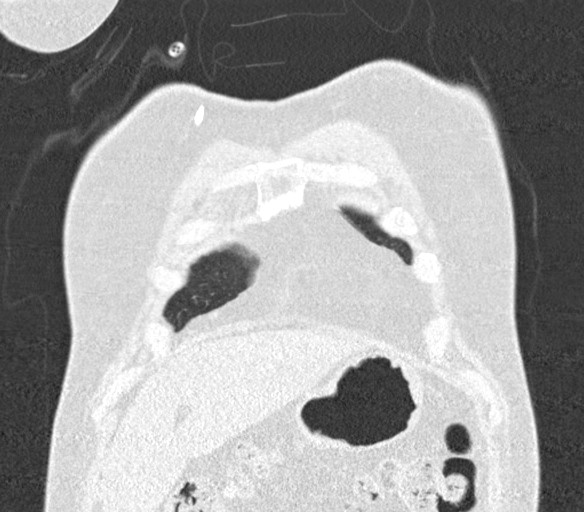
[im 142/355  lung]
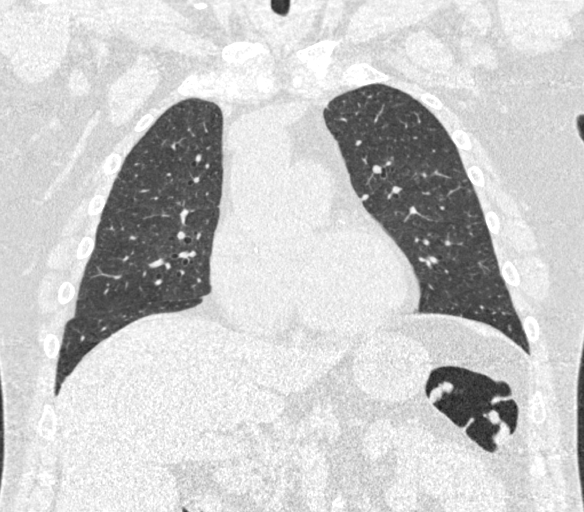
[im 213/355  lung]
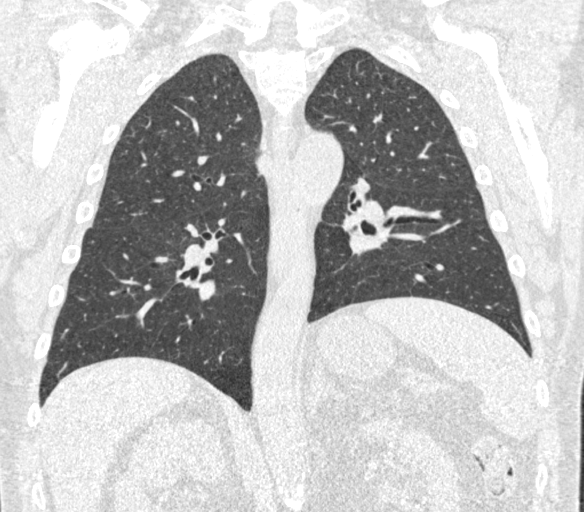

[15 of 40 positions shown; findings below may reference images not displayed]

FINDINGS: Cardiovascular: The heart size is normal. No substantial pericardial
effusion. Coronary artery calcification is evident. Atherosclerotic
calcification is noted in the wall of the thoracic aorta.

Mediastinum/Nodes: No mediastinal lymphadenopathy. No evidence for
gross hilar lymphadenopathy although assessment is limited by the
lack of intravenous contrast on today's study. The esophagus has
normal imaging features. There is no axillary lymphadenopathy.

Lungs/Pleura: Centrilobular emphsyema noted. Right the tiny upper
lung predominant centrilobular ground-glass nodules in both lungs
compatible with smoking related lung disease. Index left upper lobe
pulmonary nodule measures up to 1.6 mm.No suspicious pulmonary
nodule or mass. No focal airspace consolidation. No pleural
effusion.

Upper Abdomen: 6.3 cm exophytic lesion upper pole right kidney
measures water density compatible with a cyst. Smaller low-density
lesions identified in the upper pole right kidney measuring 1.6 cm
and upper pole left kidney measuring 3.4 cm, also likely cysts.

Musculoskeletal: No worrisome lytic or sclerotic osseous
abnormality.
IMPRESSION: 1. Lung-RADS 2, benign appearance or behavior. Continue annual
screening with low-dose chest CT without contrast in 12 months.
2. Bilateral renal cysts.
3. Emphysema ([OA]-[OA]) and Aortic Atherosclerosis ([OA]-170.0)

## 2020-02-22 NOTE — Progress Notes (Signed)
Virtual Visit via Video Note  I connected with Albert Figueroa on 02/22/20 at  1:45 PM EDT by a video enabled telemedicine application and verified that I am speaking with the correct person using two identifiers.  Location: Patient: [facility] Provider: Clinic    I discussed the limitations of evaluation and management by telemedicine and the availability of in person appointments. The patient expressed understanding and agreed to proceed.  I discussed the assessment and treatment plan with the patient. The patient was provided an opportunity to ask questions and all were answered. The patient agreed with the plan and demonstrated an understanding of the instructions.   The patient was advised to call back or seek an in-person evaluation if the symptoms worsen or if the condition fails to improve as anticipated.   In accordance with CMS guidelines, patient has met eligibility criteria including age, absence of signs or symptoms of lung cancer.  Social History   Tobacco Use  . Smoking status: Current Every Day Smoker    Packs/day: 2.00    Years: 48.00    Pack years: 96.00    Types: Cigarettes  . Smokeless tobacco: Never Used  . Tobacco comment: on Chantix  Substance Use Topics  . Alcohol use: No  . Drug use: No      A shared decision-making session was conducted prior to the performance of CT scan. This includes one or more decision aids, includes benefits and harms of screening, follow-up diagnostic testing, over-diagnosis, false positive rate, and total radiation exposure.   Counseling on the importance of adherence to annual lung cancer LDCT screening, impact of co-morbidities, and ability or willingness to undergo diagnosis and treatment is imperative for compliance of the program.   Counseling on the importance of continued smoking cessation for former smokers; the importance of smoking cessation for current smokers, and information about tobacco cessation interventions have been given to  patient including [place] and [facility] quit  programs.   Written order for lung cancer screening with LDCT has been given to the patient and any and all questions have been answered to the best of my abilities.    Yearly follow up will be coordinated by Albert Figueroa, Thoracic Navigator.  I provided 15 minutes of face-to-face video visit time during this encounter, and > 50% was spent counseling as documented under my assessment & plan.   Jacquelin Hawking, NP

## 2020-02-24 ENCOUNTER — Encounter: Payer: Self-pay | Admitting: *Deleted

## 2020-02-24 DIAGNOSIS — I700 Atherosclerosis of aorta: Secondary | ICD-10-CM | POA: Insufficient documentation

## 2020-02-24 DIAGNOSIS — J438 Other emphysema: Secondary | ICD-10-CM | POA: Insufficient documentation

## 2020-09-01 ENCOUNTER — Other Ambulatory Visit: Payer: Self-pay | Admitting: Orthopedic Surgery

## 2020-09-01 DIAGNOSIS — M25511 Pain in right shoulder: Secondary | ICD-10-CM

## 2020-09-10 ENCOUNTER — Ambulatory Visit
Admission: RE | Admit: 2020-09-10 | Discharge: 2020-09-10 | Disposition: A | Discharge status: Home / Self Care | Payer: Medicare Other | Source: Ambulatory Visit | Attending: Orthopedic Surgery | Admitting: Orthopedic Surgery

## 2020-09-10 ENCOUNTER — Other Ambulatory Visit: Payer: Self-pay

## 2020-09-10 DIAGNOSIS — M25511 Pain in right shoulder: Secondary | ICD-10-CM | POA: Diagnosis present

## 2020-09-10 IMAGING — MR MR SHOULDER*R* W/O CM
5 series · 40 of 40 positions shown · non-contrast
Comparison: Plain films right shoulder [DATE].

CLINICAL DATA: Chronic right shoulder pain and limited range of
motion. No known injury.

EXAM:
MRI OF THE RIGHT SHOULDER WITHOUT CONTRAST
TECHNIQUE: Multiplanar, multisequence MR imaging of the shoulder was performed.
No intravenous contrast was administered.

[Series 4: T2 fat-sat · oblique · right · 4.0mm · 0.58mm/px · 8 of 25 slices shown (1 of 3)]
[im 1/25]
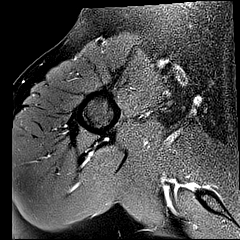
[im 4/25]
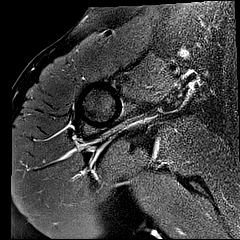
[im 7/25]
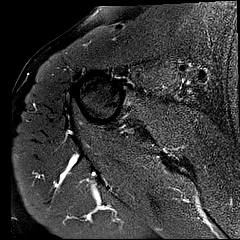
[im 11/25]
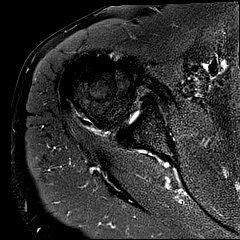
[im 14/25]
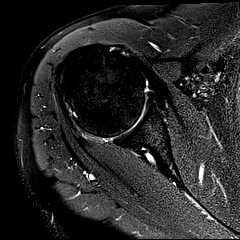
[im 18/25]
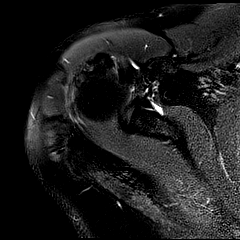
[im 21/25]
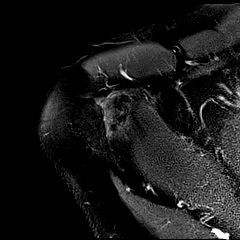
[im 25/25]
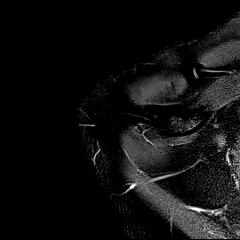

[Series 5: PD · oblique · right · 4.0mm · 0.44mm/px · 9 of 26 slices shown]
[im 1/26]
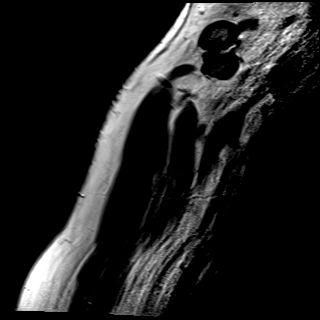
[im 4/26]
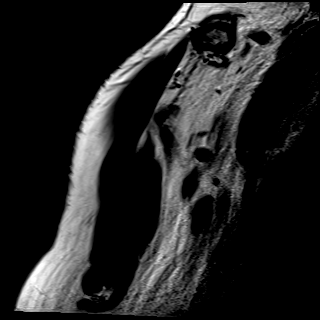
[im 7/26]
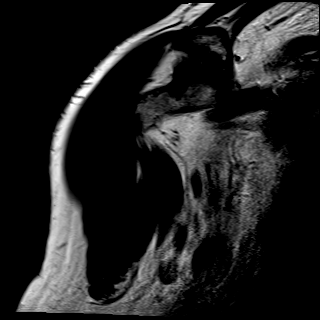
[im 10/26]
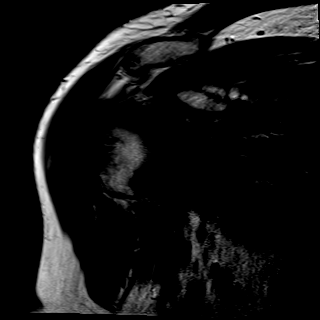
[im 13/26]
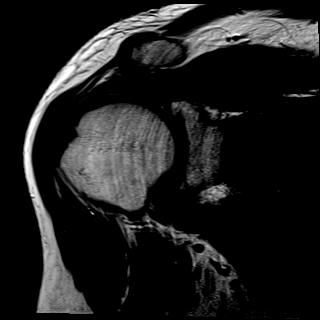
[im 16/26]
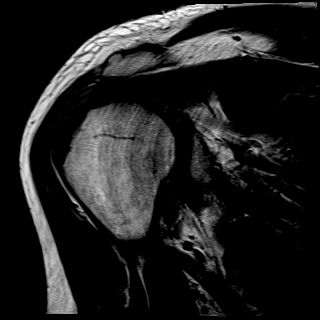
[im 19/26]
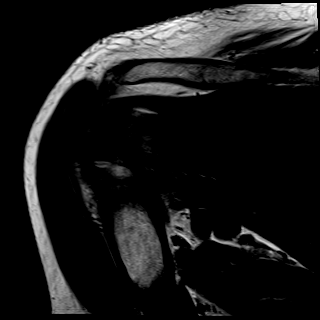
[im 22/26]
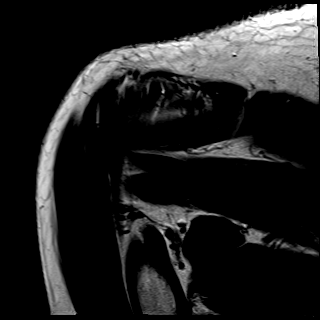
[im 26/26]
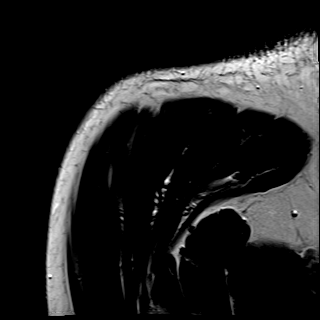

[Series 6: T2 fat-sat · oblique · right · 4.0mm · 0.58mm/px · 9 of 26 slices shown (2 of 3)]
[im 1/26]
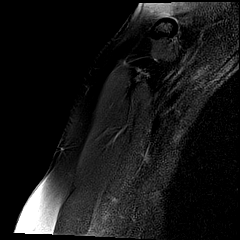
[im 4/26]
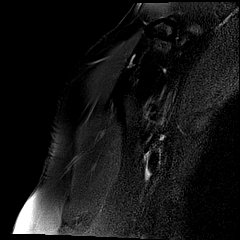
[im 7/26]
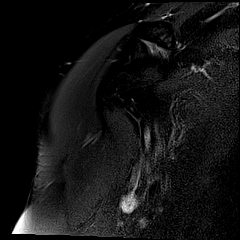
[im 10/26]
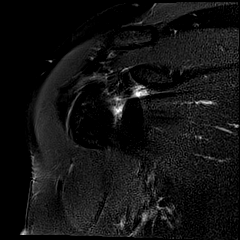
[im 13/26]
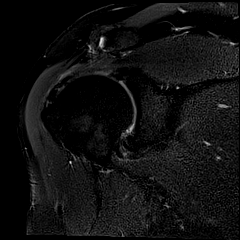
[im 16/26]
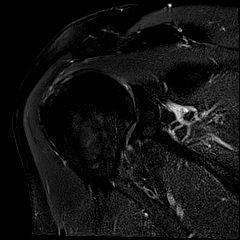
[im 19/26]
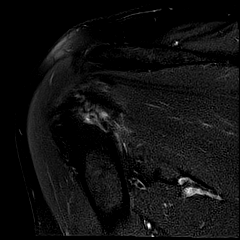
[im 22/26]
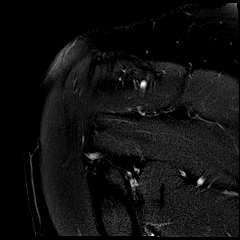
[im 26/26]
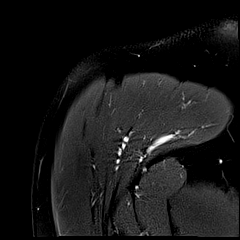

[Series 7: T2 fat-sat · oblique · right · 4.0mm · 0.27mm/px · 7 of 22 slices shown (3 of 3)]
[im 1/22]
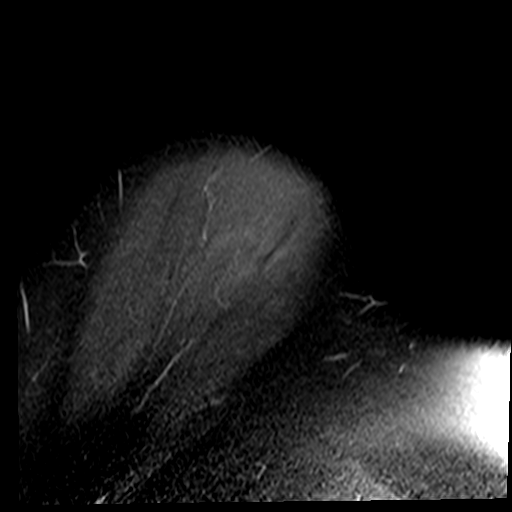
[im 4/22]
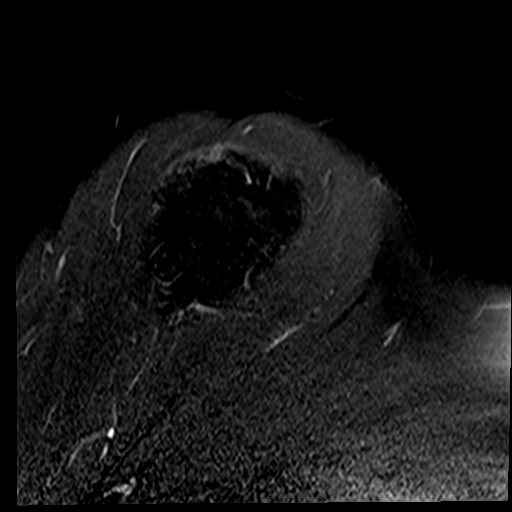
[im 8/22]
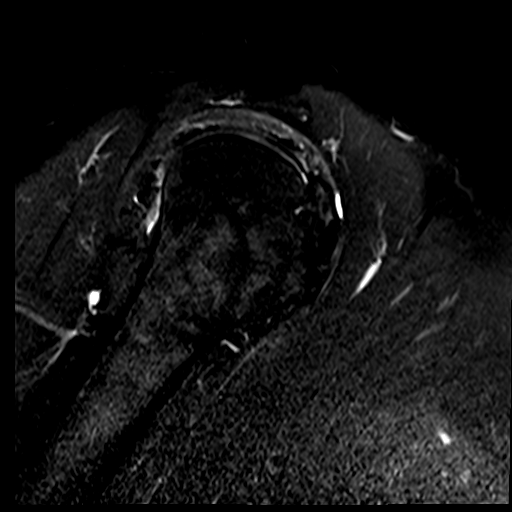
[im 11/22]
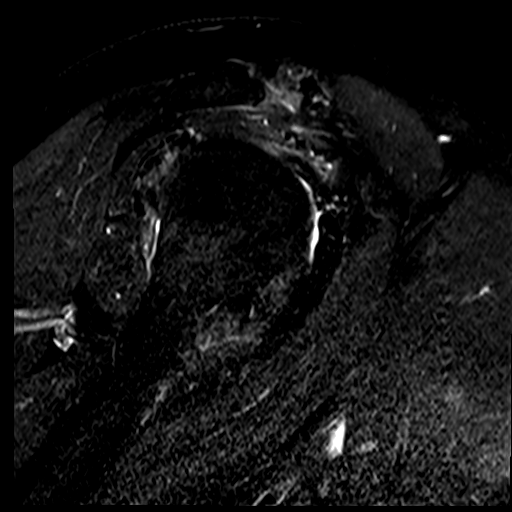
[im 15/22]
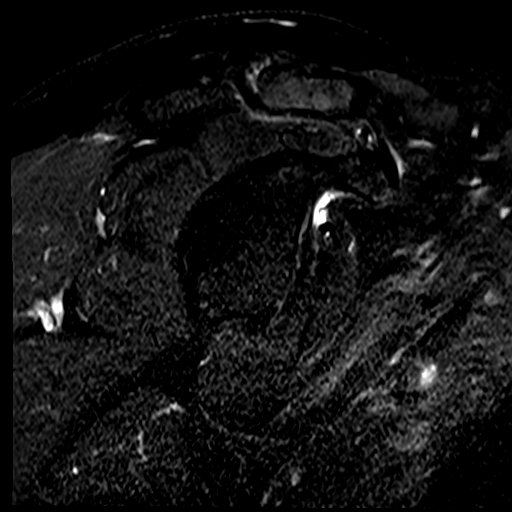
[im 18/22]
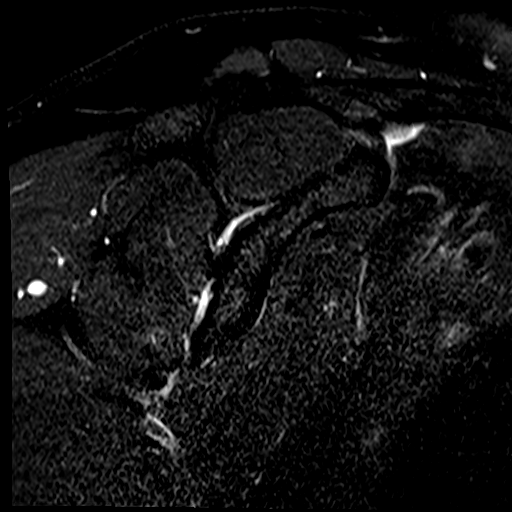
[im 22/22]
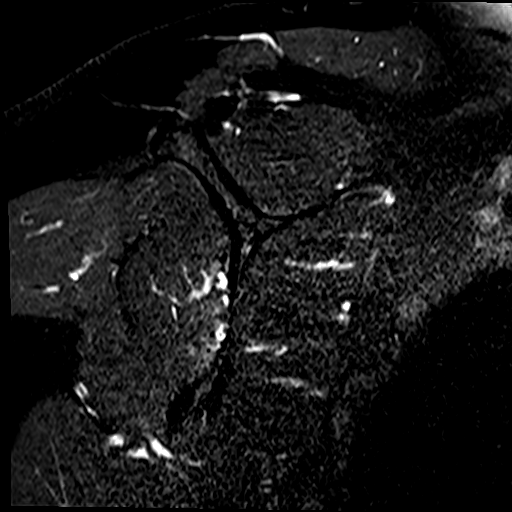

[Series 8: T1 · oblique · right · 4.0mm · 0.44mm/px · 7 of 22 slices shown]
[im 1/22]
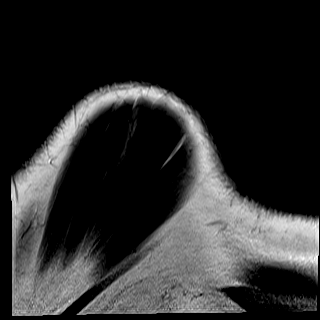
[im 4/22]
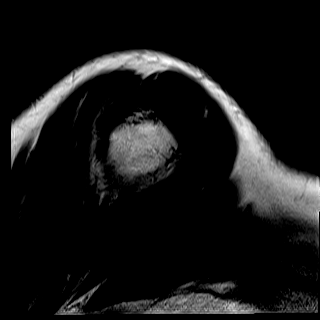
[im 8/22]
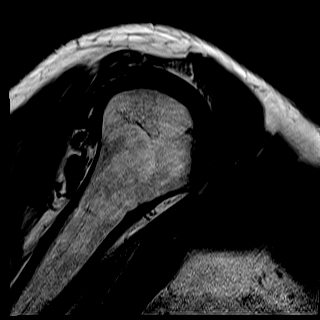
[im 11/22]
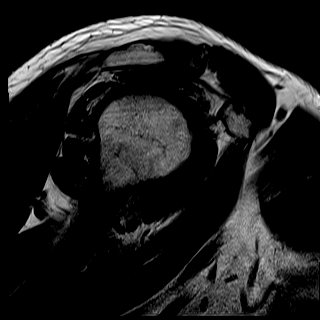
[im 15/22]
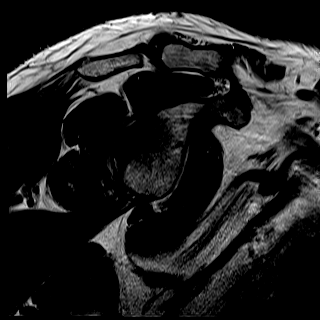
[im 18/22]
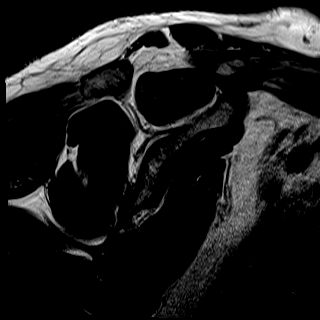
[im 22/22]
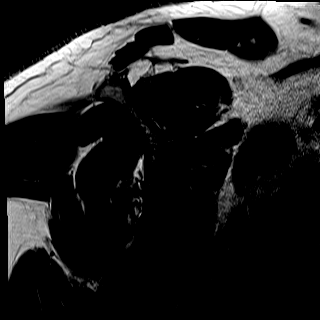

[40 of 40 positions shown; findings below may reference images not displayed]

FINDINGS: Rotator cuff: Intact. There is mild supraspinatus and infraspinatus
tendinopathy.

Muscles:  Normal without atrophy or focal lesion

Biceps long head: The tendon is almost completely torn as it enters
the glenohumeral joint. A thin fragment of tendon is seen in the
superior aspect of the bicipital groove.

Acromioclavicular Joint: Moderate osteoarthritis. Type 1 acromion.
No subacromial/subdeltoid bursal fluid.

Glenohumeral Joint: Negative.

Labrum:  Intact.

Bones:  No fracture, contusion or focal lesion.

Other: None.
IMPRESSION: The long head of biceps is almost completely torn as it enters the
glenohumeral joint. Only a thin fragment of tendon is seen in the
bicipital groove.

Mild supraspinatus and infraspinatus tendinopathy without tear.

Moderate acromioclavicular osteoarthritis.

## 2020-09-20 ENCOUNTER — Encounter: Payer: Self-pay | Admitting: Orthopedic Surgery

## 2020-09-20 ENCOUNTER — Other Ambulatory Visit: Payer: Self-pay

## 2020-09-20 ENCOUNTER — Other Ambulatory Visit: Payer: Self-pay | Admitting: Orthopedic Surgery

## 2020-09-26 ENCOUNTER — Other Ambulatory Visit
Admission: RE | Admit: 2020-09-26 | Discharge: 2020-09-26 | Disposition: A | Discharge status: Home / Self Care | Payer: Medicare Other | Source: Ambulatory Visit | Attending: Orthopedic Surgery | Admitting: Orthopedic Surgery

## 2020-09-26 ENCOUNTER — Other Ambulatory Visit: Payer: Self-pay

## 2020-09-26 DIAGNOSIS — Z01812 Encounter for preprocedural laboratory examination: Secondary | ICD-10-CM | POA: Insufficient documentation

## 2020-09-26 DIAGNOSIS — Z20822 Contact with and (suspected) exposure to covid-19: Secondary | ICD-10-CM | POA: Insufficient documentation

## 2020-09-27 LAB — SARS CORONAVIRUS 2 (TAT 6-24 HRS): SARS Coronavirus 2: NEGATIVE

## 2020-09-28 ENCOUNTER — Other Ambulatory Visit: Payer: Self-pay

## 2020-09-28 ENCOUNTER — Ambulatory Visit
Admission: RE | Admit: 2020-09-28 | Discharge: 2020-09-28 | Disposition: A | Discharge status: Home / Self Care | Payer: Medicare Other | Attending: Orthopedic Surgery | Admitting: Orthopedic Surgery

## 2020-09-28 ENCOUNTER — Encounter
Admission: RE | Disposition: A | Discharge status: Home / Self Care | Payer: Self-pay | Source: Home / Self Care | Attending: Orthopedic Surgery

## 2020-09-28 ENCOUNTER — Ambulatory Visit: Payer: Medicare Other | Admitting: Anesthesiology

## 2020-09-28 ENCOUNTER — Encounter: Payer: Self-pay | Admitting: Orthopedic Surgery

## 2020-09-28 DIAGNOSIS — M75111 Incomplete rotator cuff tear or rupture of right shoulder, not specified as traumatic: Secondary | ICD-10-CM | POA: Insufficient documentation

## 2020-09-28 DIAGNOSIS — Z794 Long term (current) use of insulin: Secondary | ICD-10-CM | POA: Insufficient documentation

## 2020-09-28 DIAGNOSIS — M659 Synovitis and tenosynovitis, unspecified: Secondary | ICD-10-CM | POA: Diagnosis not present

## 2020-09-28 DIAGNOSIS — M7541 Impingement syndrome of right shoulder: Secondary | ICD-10-CM | POA: Insufficient documentation

## 2020-09-28 DIAGNOSIS — J439 Emphysema, unspecified: Secondary | ICD-10-CM | POA: Insufficient documentation

## 2020-09-28 DIAGNOSIS — N189 Chronic kidney disease, unspecified: Secondary | ICD-10-CM | POA: Diagnosis not present

## 2020-09-28 DIAGNOSIS — S46811A Strain of other muscles, fascia and tendons at shoulder and upper arm level, right arm, initial encounter: Secondary | ICD-10-CM | POA: Diagnosis not present

## 2020-09-28 DIAGNOSIS — I129 Hypertensive chronic kidney disease with stage 1 through stage 4 chronic kidney disease, or unspecified chronic kidney disease: Secondary | ICD-10-CM | POA: Diagnosis not present

## 2020-09-28 DIAGNOSIS — E1122 Type 2 diabetes mellitus with diabetic chronic kidney disease: Secondary | ICD-10-CM | POA: Diagnosis not present

## 2020-09-28 DIAGNOSIS — Z79899 Other long term (current) drug therapy: Secondary | ICD-10-CM | POA: Insufficient documentation

## 2020-09-28 DIAGNOSIS — Z888 Allergy status to other drugs, medicaments and biological substances status: Secondary | ICD-10-CM | POA: Diagnosis not present

## 2020-09-28 DIAGNOSIS — Z7951 Long term (current) use of inhaled steroids: Secondary | ICD-10-CM | POA: Insufficient documentation

## 2020-09-28 DIAGNOSIS — G40909 Epilepsy, unspecified, not intractable, without status epilepticus: Secondary | ICD-10-CM | POA: Diagnosis not present

## 2020-09-28 DIAGNOSIS — X58XXXA Exposure to other specified factors, initial encounter: Secondary | ICD-10-CM | POA: Insufficient documentation

## 2020-09-28 HISTORY — DX: Emphysema, unspecified: J43.9

## 2020-09-28 HISTORY — DX: Hyperlipidemia, unspecified: E78.5

## 2020-09-28 HISTORY — DX: Spinal stenosis, lumbar region without neurogenic claudication: M48.061

## 2020-09-28 LAB — GLUCOSE, CAPILLARY
Glucose-Capillary: 167 mg/dL — ABNORMAL HIGH (ref 70–99)
Glucose-Capillary: 124 mg/dL — ABNORMAL HIGH (ref 70–99)

## 2020-09-28 SURGERY — SHOULDER ARTHROSCOPY WITH SUBACROMIAL DECOMPRESSION AND DISTAL CLAVICLE EXCISION
Anesthesia: Regional | Site: Shoulder | Laterality: Right

## 2020-09-28 MED ORDER — CEFAZOLIN SODIUM-DEXTROSE 2-4 GM/100ML-% IV SOLN
2.0000 g | INTRAVENOUS | Status: AC
  Administered 2020-09-28: 13:00:00 2 g via INTRAVENOUS

## 2020-09-28 MED ORDER — FENTANYL CITRATE (PF) 100 MCG/2ML IJ SOLN
INTRAMUSCULAR | Status: DC | PRN
  Administered 2020-09-28: 50 ug via INTRAVENOUS

## 2020-09-28 MED ORDER — ACETAMINOPHEN 500 MG PO TABS: 1000.0000 mg | ORAL_TABLET | Freq: Three times a day (TID) | ORAL | 2 refills | Status: AC

## 2020-09-28 MED ORDER — OXYCODONE HCL 5 MG/5ML PO SOLN: 5.0000 mg | Freq: Once | ORAL | Status: DC | PRN

## 2020-09-28 MED ORDER — DEXAMETHASONE SODIUM PHOSPHATE 4 MG/ML IJ SOLN
INTRAMUSCULAR | Status: DC | PRN
  Administered 2020-09-28: 4 mg via INTRAVENOUS

## 2020-09-28 MED ORDER — ONDANSETRON HCL 4 MG/2ML IJ SOLN
INTRAMUSCULAR | Status: DC | PRN
  Administered 2020-09-28: 4 mg via INTRAVENOUS

## 2020-09-28 MED ORDER — BUPIVACAINE LIPOSOME 1.3 % IJ SUSP
INTRAMUSCULAR | Status: DC | PRN
  Administered 2020-09-28: 266 mg

## 2020-09-28 MED ORDER — FENTANYL CITRATE (PF) 100 MCG/2ML IJ SOLN
25.0000 ug | INTRAMUSCULAR | Status: DC | PRN
Start: 2020-09-28 — End: 2020-09-28

## 2020-09-28 MED ORDER — LACTATED RINGERS IV SOLN: INTRAVENOUS | Status: DC

## 2020-09-28 MED ORDER — OXYCODONE HCL 5 MG PO TABS: 5.0000 mg | ORAL_TABLET | ORAL | 0 refills | Status: AC | PRN

## 2020-09-28 MED ORDER — BUPIVACAINE HCL (PF) 0.5 % IJ SOLN
INTRAMUSCULAR | Status: DC | PRN
  Administered 2020-09-28: 20 mL

## 2020-09-28 MED ORDER — ONDANSETRON 4 MG PO TBDP: 4.0000 mg | ORAL_TABLET | Freq: Three times a day (TID) | ORAL | 0 refills | Status: DC | PRN

## 2020-09-28 MED ORDER — PROPOFOL 10 MG/ML IV BOLUS
INTRAVENOUS | Status: DC | PRN
  Administered 2020-09-28: 200 mg via INTRAVENOUS

## 2020-09-28 MED ORDER — LACTATED RINGERS IV SOLN
INTRAVENOUS | Status: DC | PRN
  Administered 2020-09-28: 14:00:00 4 mL

## 2020-09-28 MED ORDER — MIDAZOLAM HCL 5 MG/5ML IJ SOLN
INTRAMUSCULAR | Status: DC | PRN
  Administered 2020-09-28: 2 mg via INTRAVENOUS

## 2020-09-28 MED ORDER — ASPIRIN EC 325 MG PO TBEC: 325.0000 mg | DELAYED_RELEASE_TABLET | Freq: Every day | ORAL | 0 refills | Status: AC

## 2020-09-28 MED ORDER — OXYCODONE HCL 5 MG PO TABS
5.0000 mg | ORAL_TABLET | Freq: Once | ORAL | Status: DC | PRN
Start: 2020-09-28 — End: 2020-09-28

## 2020-09-28 SURGICAL SUPPLY — 62 items
ADAPTER IRRIG TUBE 2 SPIKE SOL (ADAPTER) ×4 IMPLANT
ADH SKN CLS APL DERMABOND .7 (GAUZE/BANDAGES/DRESSINGS)
ADPR TBG 2 SPK PMP STRL ASCP (ADAPTER) ×2
ANCH SUT BN ASCP DLV (Anchor) ×1 IMPLANT
ANCH SUT RGNRT REGENETEN (Staple) ×1 IMPLANT
ANCHOR BONE REGENETEN (Anchor) ×1 IMPLANT
ANCHOR TENDON REGENETEN (Staple) ×1 IMPLANT
APL PRP STRL LF DISP 70% ISPRP (MISCELLANEOUS) ×1
BUR BR 5.5 12 FLUTE (BURR) ×2 IMPLANT
BUR RADIUS 4.0X18.5 (BURR) ×2 IMPLANT
CANNULA PART THRD DISP 5.75X7 (CANNULA) ×2 IMPLANT
CHLORAPREP W/TINT 26 (MISCELLANEOUS) ×2 IMPLANT
COOLER POLAR GLACIER W/PUMP (MISCELLANEOUS) ×2 IMPLANT
COVER LIGHT HANDLE UNIVERSAL (MISCELLANEOUS) ×4 IMPLANT
DERMABOND ADVANCED (GAUZE/BANDAGES/DRESSINGS)
DERMABOND ADVANCED .7 DNX12 (GAUZE/BANDAGES/DRESSINGS) IMPLANT
DRAPE IMP U-DRAPE 54X76 (DRAPES) ×4 IMPLANT
DRAPE INCISE IOBAN 66X45 STRL (DRAPES) ×2 IMPLANT
DRAPE U-SHAPE 48X52 POLY STRL (PACKS) ×2 IMPLANT
DRSG TEGADERM 4X4.75 (GAUZE/BANDAGES/DRESSINGS) ×8 IMPLANT
ELECT REM PT RETURN 9FT ADLT (ELECTROSURGICAL) ×2
ELECTRODE REM PT RTRN 9FT ADLT (ELECTROSURGICAL) IMPLANT
GAUZE XEROFORM 1X8 LF (GAUZE/BANDAGES/DRESSINGS) ×2 IMPLANT
GLOVE BIO SURGEON STRL SZ7.5 (GLOVE) ×4 IMPLANT
GLOVE BIOGEL PI IND STRL 8 (GLOVE) ×1 IMPLANT
GLOVE BIOGEL PI INDICATOR 8 (GLOVE) ×1
GOWN STRL REIN 2XL XLG LVL4 (GOWN DISPOSABLE) ×2 IMPLANT
GOWN STRL REUS W/ TWL LRG LVL3 (GOWN DISPOSABLE) ×1 IMPLANT
GOWN STRL REUS W/TWL LRG LVL3 (GOWN DISPOSABLE) ×2
IMPL REGENETEN LRG (Shoulder) IMPLANT
IMPLANT REGENETEN LRG (Shoulder) ×2 IMPLANT
IV LACTATED RINGER IRRG 3000ML (IV SOLUTION) ×18
IV LR IRRIG 3000ML ARTHROMATIC (IV SOLUTION) ×8 IMPLANT
KIT STABILIZATION SHOULDER (MISCELLANEOUS) ×2 IMPLANT
KIT TURNOVER KIT A (KITS) ×2 IMPLANT
MANIFOLD NEPTUNE II (INSTRUMENTS) ×2 IMPLANT
MASK FACE SPIDER DISP (MASK) ×2 IMPLANT
MAT GRAY ABSORB FLUID 28X50 (MISCELLANEOUS) ×4 IMPLANT
NDL SAFETY ECLIPSE 18X1.5 (NEEDLE) ×1 IMPLANT
NEEDLE HYPO 18GX1.5 SHARP (NEEDLE) ×2
PACK ARTHROSCOPY SHOULDER (MISCELLANEOUS) ×2 IMPLANT
PAD WRAPON POLAR SHDR XLG (MISCELLANEOUS) IMPLANT
PDS II 0 CT-1 ×2 IMPLANT
PENCIL SMOKE EVACUATOR (MISCELLANEOUS) ×1 IMPLANT
SET TUBE SUCT SHAVER OUTFL 24K (TUBING) ×2 IMPLANT
SET TUBE TIP INTRA-ARTICULAR (MISCELLANEOUS) ×2 IMPLANT
SPONGE GAUZE 2X2 8PLY STRL LF (GAUZE/BANDAGES/DRESSINGS) ×8 IMPLANT
SUT ETHILON 3-0 FS-10 30 BLK (SUTURE) ×2
SUT MNCRL 4-0 (SUTURE) ×2
SUT MNCRL 4-0 27XMFL (SUTURE) ×1
SUT VIC AB 0 CT1 36 (SUTURE) ×1 IMPLANT
SUT VIC AB 2-0 CT2 27 (SUTURE) ×1 IMPLANT
SUT VIC AB 3-0 SH 27 (SUTURE) ×2
SUT VIC AB 3-0 SH 27X BRD (SUTURE) IMPLANT
SUTURE EHLN 3-0 FS-10 30 BLK (SUTURE) IMPLANT
SUTURE MNCRL 4-0 27XMF (SUTURE) IMPLANT
SYR 10ML LL (SYRINGE) ×2 IMPLANT
SYSTEM FBRTK BICEPS 1.9 DRILL (Anchor) ×1 IMPLANT
TAPE MICROFOAM 4IN (TAPE) ×2 IMPLANT
TUBING ARTHRO INFLOW-ONLY STRL (TUBING) ×2 IMPLANT
WAND WEREWOLF FLOW 90D (MISCELLANEOUS) ×2 IMPLANT
WRAPON POLAR PAD SHDR XLG (MISCELLANEOUS) ×2

## 2020-09-28 NOTE — Progress Notes (Signed)
Assisted Kandice Robinsons ANMD with right, ultrasound guided, interscalene  block. Side rails up, monitors on throughout procedure. See vital signs in flow sheet. Tolerated Procedure well.

## 2020-09-28 NOTE — Anesthesia Preprocedure Evaluation (Addendum)
Anesthesia Evaluation  Patient identified by MRN, date of birth, ID band Patient awake    Reviewed: Allergy & Precautions, H&P , NPO status , Patient's Chart, lab work & pertinent test results  Airway Mallampati: II  TM Distance: >3 FB Neck ROM: full    Dental no notable dental hx.    Pulmonary COPD,  COPD inhaler, Current SmokerPatient did not abstain from smoking.,    Pulmonary exam normal        Cardiovascular hypertension, On Medications Normal cardiovascular exam Rhythm:regular Rate:Normal     Neuro/Psych Seizures -, Well Controlled,  Right sided VP shunt- will look on ultrasound and make sure that its not in the block field.  If any concern, will not place block at this time.    *Update- VP shunt well visualized on ultrasound well medial to the brachial plexus.  Block placed with good visualization of the needle and absolutely no close proximation of the needle/medication to the VP shunt. negative psych ROS   GI/Hepatic negative GI ROS, Neg liver ROS,   Endo/Other  diabetes, Well Controlled, Type 2, Insulin Dependent  Renal/GU      Musculoskeletal   Abdominal   Peds  Hematology negative hematology ROS (+)   Anesthesia Other Findings   Reproductive/Obstetrics                           Anesthesia Physical Anesthesia Plan  ASA: III  Anesthesia Plan: General LMA and Regional   Post-op Pain Management: GA combined w/ Regional for post-op pain   Induction:   PONV Risk Score and Plan: Treatment may vary due to age or medical condition and Ondansetron  Airway Management Planned:   Additional Equipment:   Intra-op Plan:   Post-operative Plan:   Informed Consent: I have reviewed the patients History and Physical, chart, labs and discussed the procedure including the risks, benefits and alternatives for the proposed anesthesia with the patient or authorized representative who has  indicated his/her understanding and acceptance.       Plan Discussed with:   Anesthesia Plan Comments:        Anesthesia Quick Evaluation

## 2020-09-28 NOTE — Transfer of Care (Signed)
Immediate Anesthesia Transfer of Care Note  Patient: Albert Figueroa  Procedure(s) Performed: Right shoulder arthroscopic subacromial decompression with Regen patch application, and open subpectoral biceps repair (Right Shoulder)  Patient Location: PACU  Anesthesia Type: General LMA, Regional  Level of Consciousness: awake, alert  and patient cooperative  Airway and Oxygen Therapy: Patient Spontanous Breathing and Patient connected to supplemental oxygen  Post-op Assessment: Post-op Vital signs reviewed, Patient's Cardiovascular Status Stable, Respiratory Function Stable, Patent Airway and No signs of Nausea or vomiting  Post-op Vital Signs: Reviewed and stable  Complications: No complications documented.

## 2020-09-28 NOTE — Anesthesia Procedure Notes (Signed)
Anesthesia Regional Block: Interscalene brachial plexus block   Pre-Anesthetic Checklist: ,, timeout performed, Correct Patient, Correct Site, Correct Laterality, Correct Procedure, Correct Position, site marked, Risks and benefits discussed,  Surgical consent,  Pre-op evaluation,  At surgeon's request and post-op pain management  Laterality: Right  Prep: chloraprep       Needles:  Injection technique: Single-shot  Needle Type: Stimiplex     Needle Length: 10cm  Needle Gauge: 21     Additional Needles:   Procedures:,,,, ultrasound used (permanent image in chart),,,,  Narrative:  Start time: 09/28/2020 12:18 PM End time: 09/28/2020 12:30 PM Injection made incrementally with aspirations every 5 mL.  Performed by: Personally  Anesthesiologist: Elgie Collard, MD  Additional Notes: Functioning IV was confirmed and monitors applied. Ultrasound guidance: relevant anatomy identified, needle position confirmed, local anesthetic spread visualized around nerve(s)., vascular puncture avoided.  Image printed for medical record.  Negative aspiration and no paresthesias; incremental administration of local anesthetic. The patient tolerated the procedure well. Vitals signes recorded in RN notes.  Total 20 mL 0.5% Bupivacaine and 266 mg Exparel injected.  Brachial plexus located well lateral to right sided VP shunt.  Able to visualize both structures well, including needle placement, during the block and made sure no needle approximation to the VP shunt.

## 2020-09-28 NOTE — Discharge Instructions (Signed)
Post-Op Instructions - Arthroscopic Shoulder Surgery with Biceps Tenodesis  1. Bracing: You will wear a shoulder immobilizer or sling for 4 weeks.   2. Driving: When driving, do not wear the immobilizer. Ideally, we recommend no driving for 4 weeks while sling is in place as one arm will be immobilized.   3. Activity: No active lifting for 6 weeks. Wrist, hand, and elbow motion only. You are permitted to bend and straighten the elbow passively only (no active elbow motion). You may use your hand and wrist for typing, writing, and managing utensils (cutting food). Do not lift more than a coffee cup for 8 weeks.  When sleeping or resting, inclined positions (recliner chair or wedge pillow) and a pillow under the forearm for support may provide better comfort for up to 4 weeks.  Avoid long distance travel for 4 weeks.  Return to normal activities normally takes 4 months on average. If rehab goes very well, may be able to do most activities at 3 months, except overhead or contact sports.  4. Physical Therapy: Begins 3-4 days after surgery, and proceed 1 time per week for the first 6 weeks, then 1-2 times per week from weeks 6-20 post-op.  5. Medications:  - You will be provided a prescription for narcotic pain medicine. After surgery, take 1-2 narcotic tablets every 4 hours if needed for severe pain.  - A prescription for anti-nausea medication will be provided in case the narcotic medicine causes nausea - take 1 tablet every 6 hours only if nauseated.   - Take tylenol 1000 mg (2 Extra Strength tablets or 3 regular strength) every 8 hours for pain.  May decrease or stop tylenol 5 days after surgery if you are having minimal pain. - Take ASA '325mg'$ /day x 2 weeks to help prevent DVTs/PEs (blood clots).    If you are taking prescription medication for anxiety, depression, insomnia, muscle spasm, chronic pain, or for attention deficit disorder, you are advised that you are at a higher risk of adverse  effects with use of narcotics post-op, including narcotic addiction/dependence, depressed breathing, death. If you use non-prescribed substances: alcohol, marijuana, cocaine, heroin, methamphetamines, etc., you are at a higher risk of adverse effects with use of narcotics post-op, including narcotic addiction/dependence, depressed breathing, death. You are advised that taking > 50 morphine milligram equivalents (MME) of narcotic pain medication per day results in twice the risk of overdose or death. For your prescription provided: oxycodone 5 mg - taking more than 6 tablets per day would result in > 50 morphine milligram equivalents (MME) of narcotic pain medication. Be advised that we will prescribe narcotics short-term, for acute post-operative pain only - 3 weeks for major operations such as shoulder repair/reconstruction surgeries.    6. Post-Op Appointment:  Your first post-op appointment will be 10-14 days post-op.  7. Work or School: For most, but not all procedures, we advise staying out of work or school for at least 1 to 2 weeks in order to recover from the stress of surgery and to allow time for healing.   If you need a work or school note this can be provided.   8. Smoking: If you are a smoker, you need to refrain from smoking in the postoperative period. The nicotine in cigarettes will inhibit healing of your shoulder repair and decrease the chance of successful repair. Similarly, nicotine containing products (gum, patches) should be avoided.   Post-operative Brace: Apply and remove the brace you received as you were instructed  to at the time of fitting and as described in detail as the brace's instructions for use indicate.  Wear the brace for the period of time prescribed by your physician.  The brace can be cleaned with soap and water and allowed to air dry only.  Should the brace result in increased pain, decreased feeling (numbness/tingling), increased swelling or an overall  worsening of your medical condition, please contact your doctor immediately.  If an emergency situation occurs as a result of wearing the brace after normal business hours, please dial 911 and seek immediate medical attention.  Let your doctor know if you have any further questions about the brace issued to you. Refer to the shoulder sling instructions for use if you have any questions regarding the correct fit of your shoulder sling.  [facility] [employer]: [phone]  Video that illustrates how to properly use a shoulder sling: "Instructions for Proper Use of an Orthopaedic Sling" [email or web address]  Information for Discharge Teaching: EXPAREL (bupivacaine liposome injectable suspension)   Your surgeon or anesthesiologist gave you EXPAREL(bupivacaine) to help control your pain after surgery.   EXPAREL is a local anesthetic that provides pain relief by numbing the tissue around the surgical site.  EXPAREL is designed to release pain medication over time and can control pain for up to 72 hours.  Depending on how you respond to EXPAREL, you may require less pain medication during your recovery.  Possible side effects:  Temporary loss of sensation or ability to move in the area where bupivacaine was injected.  Nausea, vomiting, constipation  Rarely, numbness and tingling in your mouth or lips, lightheadedness, or anxiety may occur.  Call your doctor right away if you think you may be experiencing any of these sensations, or if you have other questions regarding possible side effects.  Follow all other discharge instructions given to you by your surgeon or nurse. Eat a healthy diet and drink plenty of water or other fluids.  If you return to the hospital for any reason within 96 hours following the administration of EXPAREL, it is important for health care providers to know that you have received this anesthetic. A teal colored band has been  placed on your arm with the date, time and amount of EXPAREL you have received in order to alert and inform your health care providers. Please leave this armband in place for the full 96 hours following administration, and then you may remove the band.   General Anesthesia, Adult, Care After This sheet gives you information about how to care for yourself after your procedure. Your health care provider may also give you more specific instructions. If you have problems or questions, contact your health care provider. What can I expect after the procedure? After the procedure, the following side effects are common:  Pain or discomfort at the IV site.  Nausea.  Vomiting.  Sore throat.  Trouble concentrating.  Feeling cold or chills.  Weak or tired.  Sleepiness and fatigue.  Soreness and body aches. These side effects can affect parts of the body that were not involved in surgery. Follow these instructions at home:  For at least 24 hours after the procedure:  Have a responsible adult stay with you. It is important to have someone help care for you until you are awake and alert.  Rest as needed.  Do not: ? Participate in activities in which you could fall or become injured. ? Drive. ? Use heavy machinery. ? Drink alcohol. ? Take  sleeping pills or medicines that cause drowsiness. ? Make important decisions or sign legal documents. ? Take care of children on your own. Eating and drinking  Follow any instructions from your health care provider about eating or drinking restrictions.  When you feel hungry, start by eating small amounts of foods that are soft and easy to digest (bland), such as toast. Gradually return to your regular diet.  Drink enough fluid to keep your urine pale yellow.  If you vomit, rehydrate by drinking water, juice, or clear broth. General instructions  If you have sleep apnea, surgery and certain medicines can increase your risk for breathing  problems. Follow instructions from your health care provider about wearing your sleep device: ? Anytime you are sleeping, including during daytime naps. ? While taking prescription pain medicines, sleeping medicines, or medicines that make you drowsy.  Return to your normal activities as told by your health care provider. Ask your health care provider what activities are safe for you.  Take over-the-counter and prescription medicines only as told by your health care provider.  If you smoke, do not smoke without supervision.  Keep all follow-up visits as told by your health care provider. This is important. Contact a health care provider if:  You have nausea or vomiting that does not get better with medicine.  You cannot eat or drink without vomiting.  You have pain that does not get better with medicine.  You are unable to pass urine.  You develop a skin rash.  You have a fever.  You have redness around your IV site that gets worse. Get help right away if:  You have difficulty breathing.  You have chest pain.  You have blood in your urine or stool, or you vomit blood. Summary  After the procedure, it is common to have a sore throat or nausea. It is also common to feel tired.  Have a responsible adult stay with you for the first 24 hours after general anesthesia. It is important to have someone help care for you until you are awake and alert.  When you feel hungry, start by eating small amounts of foods that are soft and easy to digest (bland), such as toast. Gradually return to your regular diet.  Drink enough fluid to keep your urine pale yellow.  Return to your normal activities as told by your health care provider. Ask your health care provider what activities are safe for you. This information is not intended to replace advice given to you by your health care provider. Make sure you discuss any questions you have with your health care provider. Document Revised:  09/19/2017 Document Reviewed: 05/02/2017 [employer]vier Patient Education  [place].

## 2020-09-28 NOTE — Op Note (Signed)
SURGERY DATE: 09/28/2020  PRE-OP DIAGNOSIS:  1. Right proximal biceps tendon partial tear 2. Right subacromial impingement  POST-OP DIAGNOSIS: 1. Right proximal biceps tendon partial tear 2. Right subacromial impingement 3. Right partial-thickness rotator cuff tear  PROCEDURES:  1. Right open subpectoral biceps tenodesis 2. Right extensive debridement of shoulder (glenohumeral and subacromial spaces) 3. Right subacromial decompression 4.  Right shoulder arthroscopic rotator cuff repair using Regeneten patch  SURGEON: Cato Mulligan, MD  ANESTHESIA: Gen with postoperative interscalene block  ESTIMATED BLOOD LOSS: 10cc  DRAINS:  none  TOTAL IV FLUIDS: per anesthesia   SPECIMENS: none  IMPLANTS:  [place] patch with associated tendon and bone staples Arthrex - Double loaded FiberTak Suture Anchor - x1  OPERATIVE FINDINGS:  Examination under anesthesia: A careful examination under anesthesia was performed.  Passive range of motion was: FF: 150; ER at side: 45; ER in abduction: 90; IR in abduction: 45.  Anterior load shift: NT.  Posterior load shift: NT.  Sulcus in neutral: NT.  Sulcus in ER: NT.    Intra-operative findings: A thorough arthroscopic examination of the shoulder was performed.  The findings are: 1. Biceps tendon: Split thickness tearing of the proximal biceps tendon 2. Superior labrum: Type I SLAP tear 3. Posterior labrum and capsule: normal 4. Inferior capsule and inferior recess: normal 5. Glenoid cartilage surface: normal 6. Supraspinatus attachment: Significant articular sided partial tearing affecting 30-50% of the footprint 7. Posterior rotator cuff attachment: normal 8. Humeral head articular cartilage: normal 9. Rotator interval: significant synovitis 10: Subscapularis tendon: attachment intact 11. Anterior labrum: degenerative tissue 12. IGHL: normal  OPERATIVE REPORT:   Indications for procedure: Albert Figueroa is a 64 y.o.  male with chronic right shoulder pain that is failed prolonged nonoperative management including activity modifications, medical management, and corticosteroid injections.  Shoulder pain is limiting his activities including his ability to work.  MRI and clinical exam are concerning for biceps tendon pathology and subacromial impingement/bursitis.  After discussion of risks, benefits, and alternatives to surgery, the patient elected to proceed.     Procedure in detail:  I identified the patient in the pre-operative holding area.  I marked the operative shoulder with my initials. I reviewed the risks and benefits of the proposed surgical intervention, and the patient (and/or patient's guardian) wished to proceed.  Anesthesia was then performed with an interscalene block with Exparel.  The patient was transferred to the operative suite and placed in the beach chair position.   SCDs were placed on the lower extremities. Appropriate IV antibiotics were administered prior to incision. The operative upper extremity was then prepped and draped in standard fashion. A time out was performed confirming the correct extremity, correct patient, and correct procedure.   I then created a standard posterior portal with an 11 blade. The glenohumeral joint was easily entered with a blunt trochar and the arthroscope introduced. The findings of diagnostic arthroscopy are described above. I debrided degenerative tissue including the synovitic tissue about the rotator interval, the anterior labrum, and the small anterior supraspinatus partial-thickness tear. I then coagulated the inflamed synovium to obtain hemostasis and reduce the risk of post-operative swelling using an Arthrocare radiofrequency device. I performed a biceps tenotomy using an arthroscopic scissors and used a motorized shaver to debride the stump back to a stable base.  The partial-thickness supraspinatus tear was identified.  An 0-PDS suture was passed through  both the anterior and posterior extent of the articular sided tear  using a spinal needle.  Next, the arthroscope was then introduced into the subacromial space. A direct lateral portal was created with an 11-blade after spinal needle localization.  There was significant bursal tissue, and an extensive subacromial bursectomy was performed using a combination of the shaver and Arthrocare wand. The entire acromial undersurface was exposed and the CA ligament was subperiosteally elevated to expose the anterior acromial hook. A 5.85mm barrel burr was used to create a flat anterior and lateral aspect of the acromion, converting it from a Type 2 to a Type 1 acromion. Care was made to keep the deltoid fascia intact.  Next, a switching stick was used to probe the bursal side of the rotator cuff in the region of the previously passed 0 PDS sutures.  The bursal side of the rotator cuff was intact as the switching stick could not be pushed through into the glenohumeral joint.  This confirmed appropriate partial-thickness nature of the tear. Given this, we decided to proceed with Regeneten patch placement. The Regeneten patch delivery gun was placed appropriately and the patch was delivered over the supraspinatus tendon using the PDS sutures as a reference. Tendon staples were placed medially, anteriorly, and posteriorly after making appropriate stab incisions for portal placement. Two bone staples were then placed laterally. The patch was then probed to confirm appropriate stability.  Arthroscopic fluid was evacuated from the joint.  I then directed my attention to the open subpectoral biceps tenodesis. I made an ~5cm incision parallel to the skin relaxation lines along the medial upper arm, such that 1/3 of the incision was above the pectoralis major tendon and 2/3 below it. Sharp dissection was carried down to the fascia overlying the pectoralis and the short head of the biceps. Bovie electrocautery was used to achieve  hemostasis. I made a vertical nick in the fascia in line with the short head of the biceps. The pectoralis tendon was then retracted with a large Hohmann retractor. An [employer] retractor was used to retract inferiorly. This afforded excellent visualization of the long head of the biceps tendon. It was delivered out of the incision.  There was significant tenosynovitis.  I then placed a double-loaded Arthrex FiberTak all-suture anchor at the most proximal and central portion of the inter-tubercular groove that I could visualize. I took one set of tapes and with the use of a free needle, passed one strand through the biceps tendon once and passed the other strand through the biceps in a locking fashion. Suture tapes were passed through the biceps tendon starting ~1cm away from the the musculocutaneous junction. This was also done for the other tape as well. This configuration allowed me to then parachute the tendon back into the wound as the two ends of the suture tape were tied together with a Surgeon's knot. This was repeated for the other strand of suture tape. The excess proximal tendon was sharply excised. The tension in the tenodesed long head was excellent. The wound was thoroughly irrigated with saline.  Fluid was evacuated from the shoulder, and the portals were closed with 3-0 Nylon.  The subpectoral biceps tenodesis incision was closed in layers using 3-0 Vicryl for the deep dermis and a running subcuticular 4-0 Monocryl for skin. A thin layer of Dermabond was applied. Xeroform was applied to the portals. A sterile dressing was applied, followed by a Polar Care sleeve and a SlingShot shoulder immobilizer/sling. The patient was awakened from anesthesia without difficulty and was transferred to the PACU in stable condition.  COMPLICATIONS: none  DISPOSITION: plan for discharge home after recovery in PACU  POSTOPERATIVE PLAN: Remain in sling (except hygiene and elbow/wrist/hand RoM exercises as  instructed by PT) x 4 weeks and NWB for this time. PT to begin 3-4 days after surgery. Biceps tenodesis with Regeneten patch rehab protocol.

## 2020-09-28 NOTE — H&P (Signed)
Paper H&P to be scanned into permanent record. H&P reviewed. No significant changes noted.  

## 2020-09-28 NOTE — Anesthesia Procedure Notes (Signed)
Procedure Name: LMA Insertion Date/Time: 09/28/2020 1:10 PM Performed by: Cameron Ali, CRNA Pre-anesthesia Checklist: Patient identified, Emergency Drugs available, Suction available, Timeout performed and Patient being monitored Patient Re-evaluated:Patient Re-evaluated prior to induction Oxygen Delivery Method: Circle system utilized Preoxygenation: Pre-oxygenation with 100% oxygen Induction Type: IV induction LMA: LMA inserted LMA Size: 5.0 Number of attempts: 1 Placement Confirmation: positive ETCO2 and breath sounds checked- equal and bilateral Tube secured with: Tape Dental Injury: Teeth and Oropharynx as per pre-operative assessment

## 2020-09-28 NOTE — Anesthesia Postprocedure Evaluation (Signed)
Anesthesia Post Note  Patient: Albert Figueroa  Procedure(s) Performed: Right shoulder arthroscopic subacromial decompression with Regen patch application, and open subpectoral biceps repair (Right Shoulder)     Patient location during evaluation: PACU Anesthesia Type: Regional Level of consciousness: awake and alert Pain management: pain level controlled Vital Signs Assessment: post-procedure vital signs reviewed and stable Respiratory status: spontaneous breathing Cardiovascular status: stable Anesthetic complications: no   No complications documented.  Gillian Scarce

## 2020-11-20 ENCOUNTER — Other Ambulatory Visit: Payer: Self-pay | Admitting: Nephrology

## 2020-11-20 DIAGNOSIS — D649 Anemia, unspecified: Secondary | ICD-10-CM | POA: Insufficient documentation

## 2020-11-20 DIAGNOSIS — N1831 Chronic kidney disease, stage 3a: Secondary | ICD-10-CM

## 2020-11-20 DIAGNOSIS — E1122 Type 2 diabetes mellitus with diabetic chronic kidney disease: Secondary | ICD-10-CM

## 2020-11-20 DIAGNOSIS — N179 Acute kidney failure, unspecified: Secondary | ICD-10-CM

## 2020-11-28 ENCOUNTER — Ambulatory Visit: Payer: Medicare Other

## 2020-11-29 ENCOUNTER — Ambulatory Visit
Admission: RE | Admit: 2020-11-29 | Discharge: 2020-11-29 | Disposition: A | Discharge status: Home / Self Care | Payer: Medicare Other | Source: Ambulatory Visit | Attending: Nephrology | Admitting: Nephrology

## 2020-11-29 ENCOUNTER — Other Ambulatory Visit: Payer: Self-pay

## 2020-11-29 DIAGNOSIS — E1122 Type 2 diabetes mellitus with diabetic chronic kidney disease: Secondary | ICD-10-CM | POA: Diagnosis present

## 2020-11-29 DIAGNOSIS — N1831 Chronic kidney disease, stage 3a: Secondary | ICD-10-CM | POA: Insufficient documentation

## 2020-11-29 DIAGNOSIS — N179 Acute kidney failure, unspecified: Secondary | ICD-10-CM | POA: Diagnosis present

## 2020-11-29 IMAGING — US US RENAL
1 series · 14 of 25 positions shown · non-contrast
Comparison: [DATE]

CLINICAL DATA: Acute renal injury

EXAM:
RENAL / URINARY TRACT ULTRASOUND COMPLETE

[Series 1: us renal · 0.26mm/px · 14 of 49 slices shown]
[im 1/49]
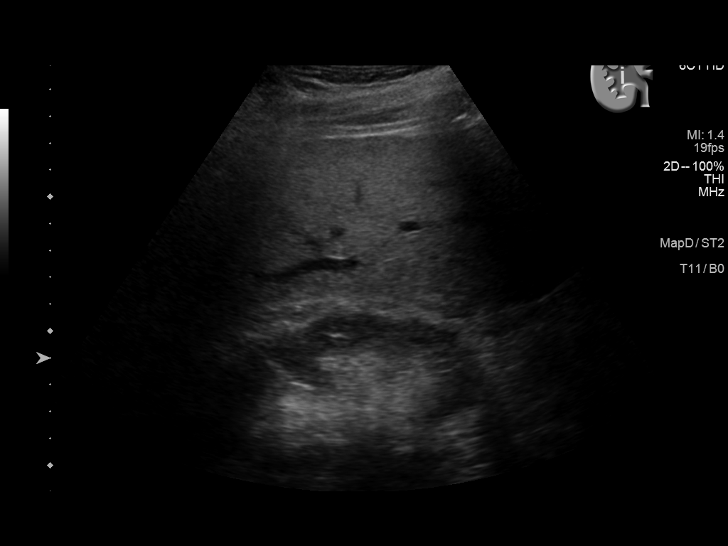
[im 5/49]
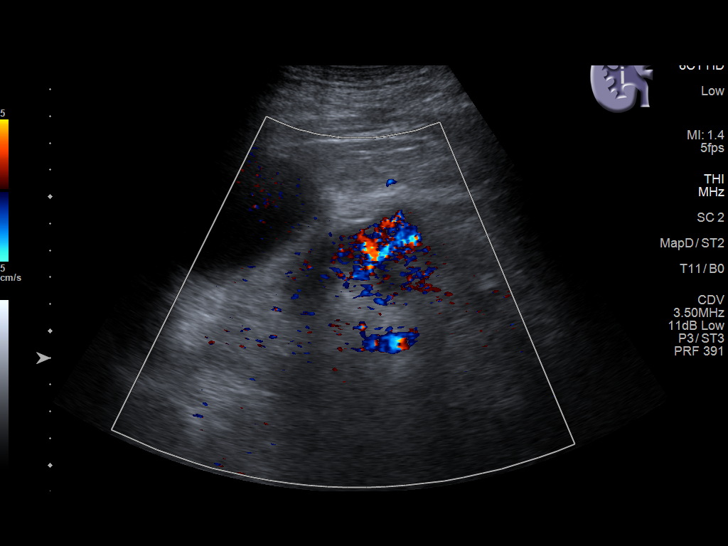
[im 9/49]
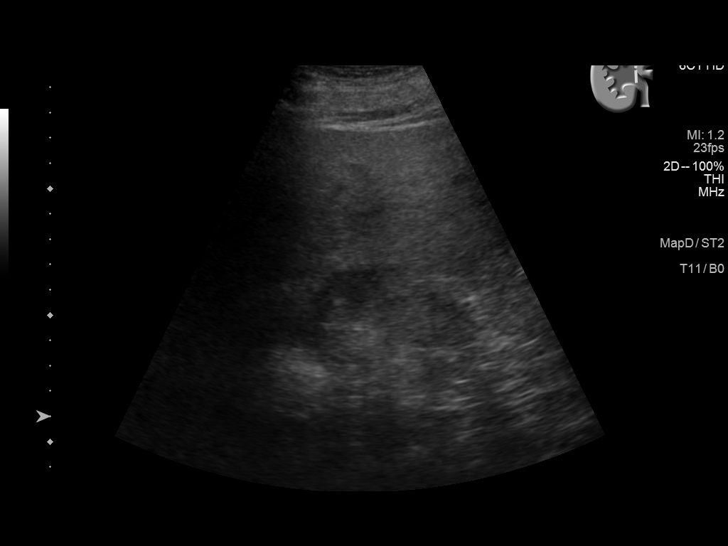
[im 13/49]
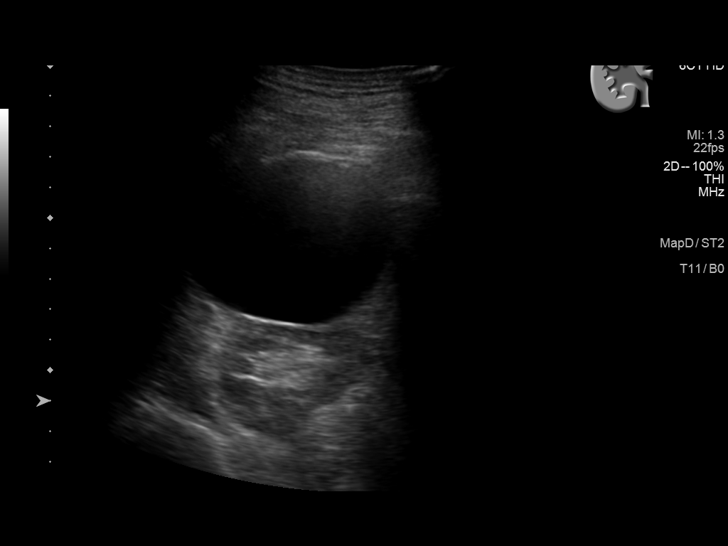
[im 17/49]
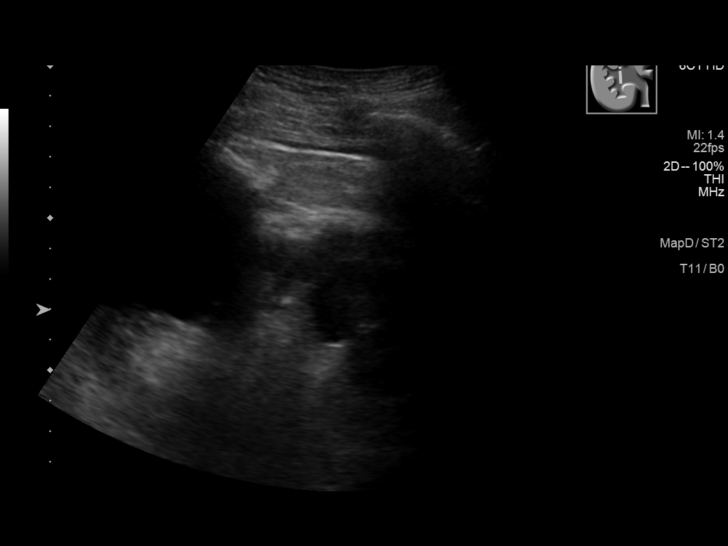
[im 19/49]
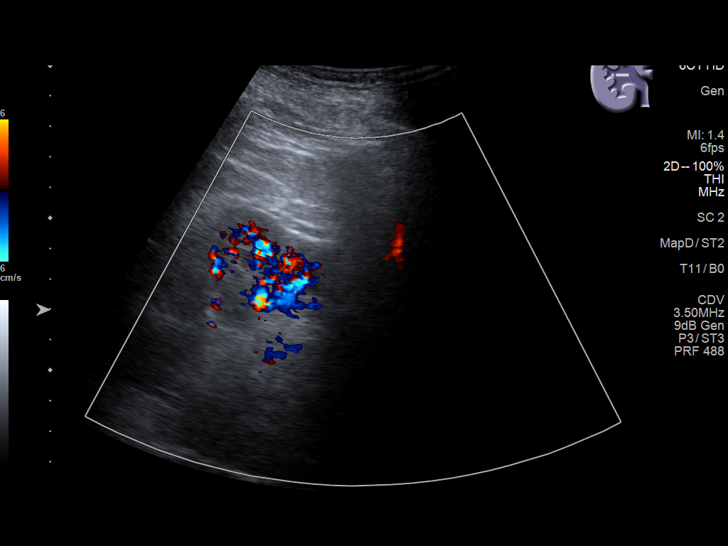
[im 23/49]
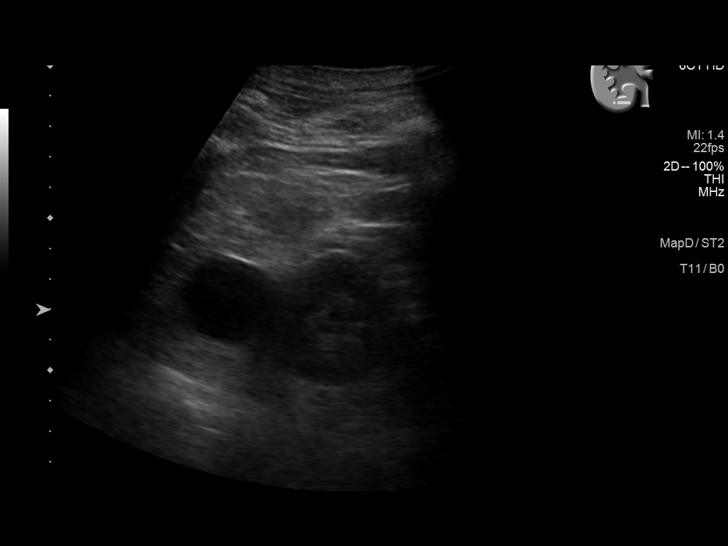
[im 27/49]
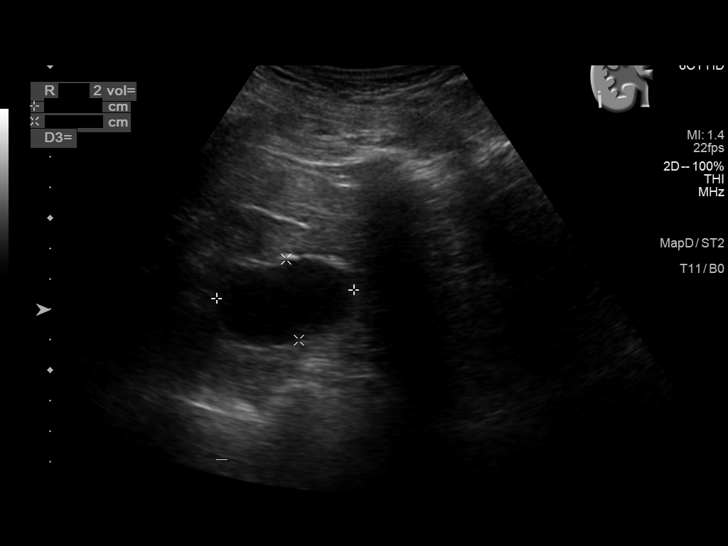
[im 31/49]
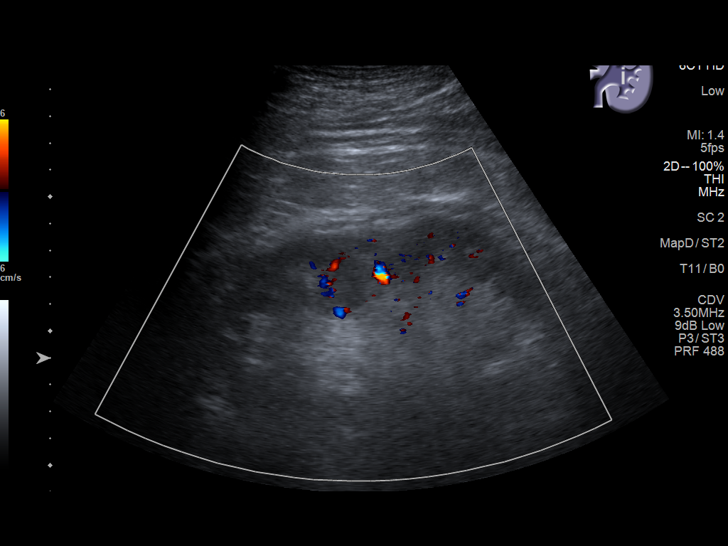
[im 33/49]
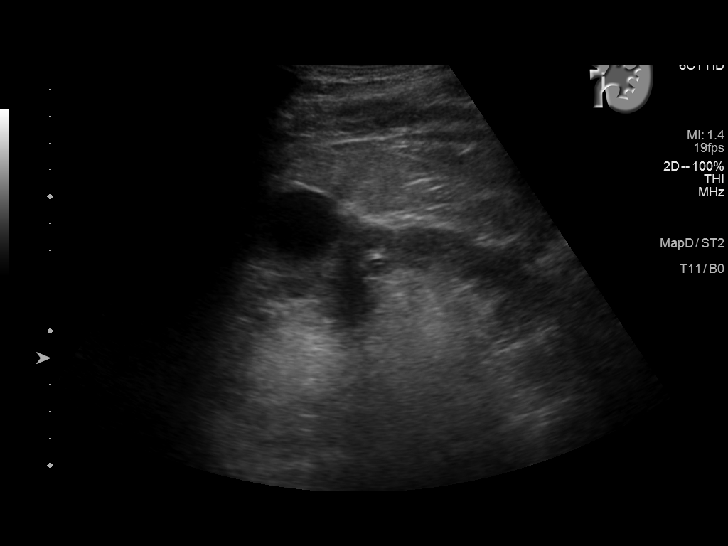
[im 37/49]
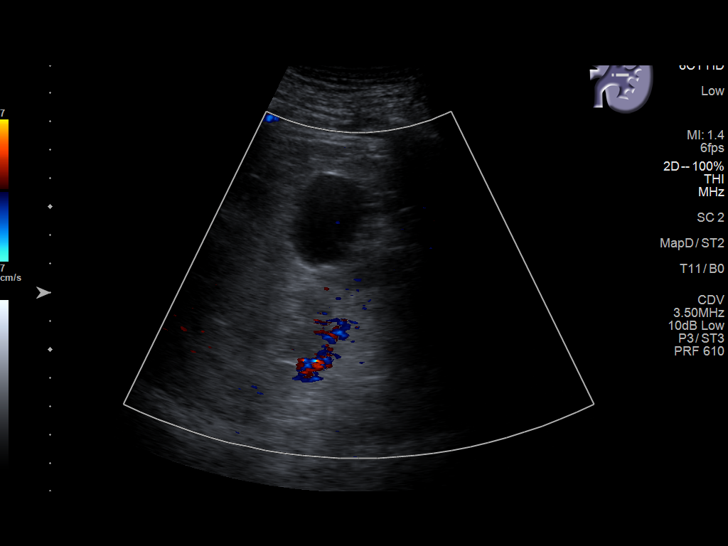
[im 41/49]
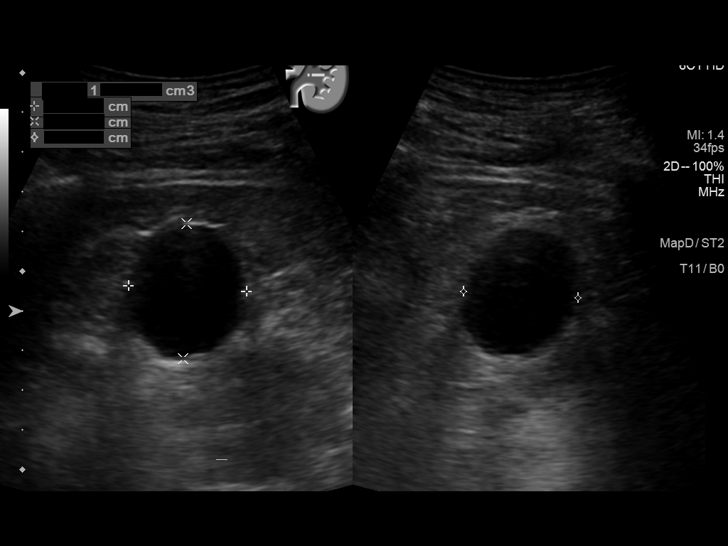
[im 45/49]
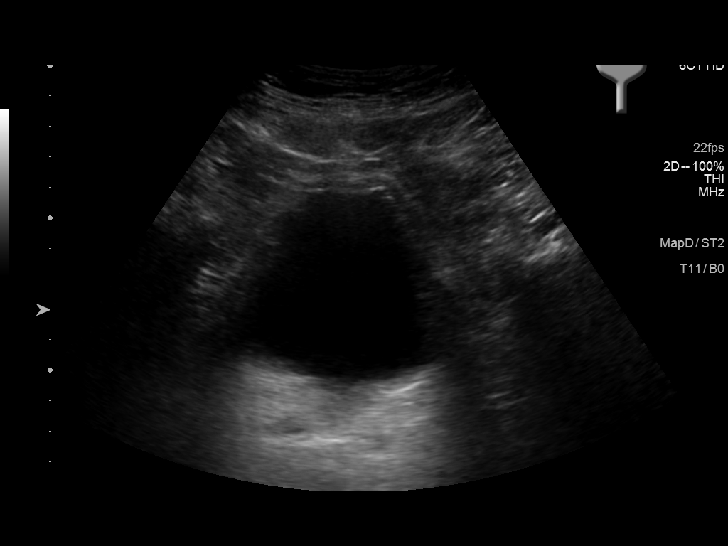
[im 49/49]
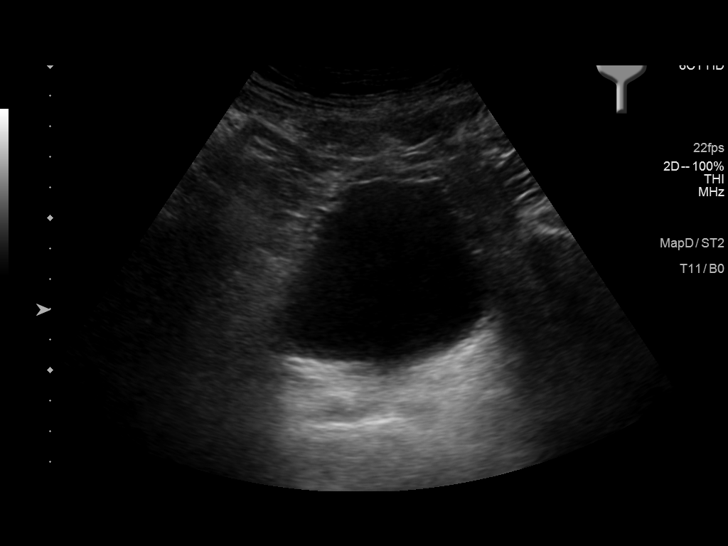

[14 of 25 positions shown; findings below may reference images not displayed]

FINDINGS: Right Kidney:

Renal measurements: 11.6 x 5.3 x 5.5 cm = volume: 177 mL. No
hydronephrosis is noted. 7.1 cm cyst is noted in the upper pole of
the right kidney. Smaller parapelvic and cortical cysts are noted as
well. No renal calculi are seen. Mild cortical thinning is noted.

Left Kidney:

Renal measurements: 12.8 x 6.6 x 5.9 cm. = volume: 261 mL. Mild
cortical thinning is noted. 3.4 cm cyst is noted in the midportion
of the left kidney.

Bladder:

Appears normal for degree of bladder distention.

Other:

None.
IMPRESSION: Bilateral simple cysts.  No obstructive changes are noted.

Mild cortical thinning is noted.

## 2020-12-27 NOTE — Progress Notes (Deleted)
No-show to initial evaluation January 01, 2021.

## 2021-01-01 ENCOUNTER — Ambulatory Visit: Payer: Medicare Other | Attending: Pain Medicine | Admitting: Pain Medicine

## 2021-01-01 ENCOUNTER — Other Ambulatory Visit: Payer: Self-pay | Admitting: Pain Medicine

## 2021-01-01 DIAGNOSIS — Z91199 Patient's noncompliance with other medical treatment and regimen due to unspecified reason: Secondary | ICD-10-CM

## 2021-01-01 DIAGNOSIS — Z79899 Other long term (current) drug therapy: Secondary | ICD-10-CM | POA: Insufficient documentation

## 2021-01-01 DIAGNOSIS — Z789 Other specified health status: Secondary | ICD-10-CM | POA: Insufficient documentation

## 2021-01-01 DIAGNOSIS — G894 Chronic pain syndrome: Secondary | ICD-10-CM | POA: Insufficient documentation

## 2021-01-01 DIAGNOSIS — M899 Disorder of bone, unspecified: Secondary | ICD-10-CM | POA: Insufficient documentation

## 2021-01-01 DIAGNOSIS — Z5329 Procedure and treatment not carried out because of patient's decision for other reasons: Secondary | ICD-10-CM

## 2021-01-01 NOTE — Progress Notes (Addendum)
Patient: Albert Figueroa  Service Category: E/M  Provider: Gaspar Cola, MD  DOB: May 15, 1956  DOS: 01/01/2021  Referring Provider: Dion Body, MD  MRN: [id number]  Setting: Ambulatory outpatient  PCP: Dion Body, MD  Type: New Patient  Specialty: Interventional Pain Management     NOTE: Precharting review (01/01/2021).  Canceled initial evaluation January 01, 2021.  Prior patient of Leone Payor, MD ([place]) and Shanon Ace, MD ([facility]).  Review of the medical records indicate that the patient had previously been to our facility and was evaluated by Dr. Gillis Santa on 05/13/2018 and 06/17/2018.  On 06/17/2018 the patient had a diagnostic bilateral lumbar facet block but he did not keep and never returned for follow-up appointment (scheduled for 07/22/2018).  His initial evaluation with Dr. Holley Raring on 05/13/2018 read: "65 year old male who presents with a chief complaint of axial low back, left hip pain that radiates into his left groin and down his left leg stopping at his knee.  Patient does have a history of chronic kidney disease with his most recent GFR 31, creatinine 2.2."  "Patient does see [facility]. Sanjuan Dame in [facility] for chronic opioid management.  His medications include oxycodone 15 mg 4 times a day with 5 mg for breakthrough pain.  Total MME is approximately 100.  Patient states that his left hip and bilateral knees are very painful.  Patient denies any bowel or bladder dysfunction.  Patient ambulate with a cane."  Historic Controlled Substance Pharmacotherapy Review  PMP and historical list of controlled substances: Oxycodone IR 10 mg, 1 tab p.o. 4 times daily (60 MME/day) + Xtampza ER ER 27 mg capsule 1 p.o. 3 times daily (135 MME/day) (by 09/14/2020); oxycodone IR 15 mg, 1 tab p.o. 5 times a day (75 mg/day of oxycodone) (112.5 MME/day) (by 02/10/2020) Current opioid analgesics:  Oxycodone IR 15 mg (# 45), 1 tab p.o. 3 times daily (45 mg/day of  oxycodone) (67.5 MME/day) (12/26/2020) written by Shanon Ace, MD ([facility]). MME/day: 67.5 mg/day   Historical Background Evaluation: PMP NARX Score Report:  Narcotic: 491 Sedative: 200 Stimulant: 000  Risk Assessment Profile: PMP NARX Overdose Risk Score: 380  Meds   Current Outpatient Medications:  .  acetaminophen (TYLENOL) 500 MG tablet, Take 2 tablets (1,000 mg total) by mouth every 8 (eight) hours., Disp: 90 tablet, Rfl: 2 .  atorvastatin (LIPITOR) 40 MG tablet, Take 40 mg by mouth 2 (two) times daily., Disp: , Rfl:  .  cyclobenzaprine (FLEXERIL) 10 MG tablet, Take 10 mg by mouth 3 (three) times daily as needed for muscle spasms., Disp: , Rfl:  .  dexlansoprazole (DEXILANT) 60 MG capsule, Take 60 mg by mouth daily., Disp: , Rfl:  .  divalproex (DEPAKOTE ER) 500 MG 24 hr tablet, Take 500 mg by mouth 2 (two) times daily., Disp: , Rfl:  .  Fluticasone-Umeclidin-Vilant (TRELEGY ELLIPTA IN), Inhale into the lungs daily., Disp: , Rfl:  .  glipiZIDE (GLUCOTROL) 10 MG tablet, Take 10 mg by mouth 2 (two) times daily before a meal., Disp: , Rfl:  .  insulin detemir (LEVEMIR) 100 UNIT/ML injection, Inject into the skin at bedtime as needed., Disp: , Rfl:  .  insulin lispro (HUMALOG) 100 UNIT/ML injection, Inject into the skin 3 (three) times daily as needed for high blood sugar., Disp: , Rfl:  .  lisinopril-hydrochlorothiazide (PRINZIDE,ZESTORETIC) 10-12.5 MG tablet, Take 1 tablet by mouth daily. 20-25, Disp: , Rfl:  .  metFORMIN (GLUCOPHAGE) 500 MG  tablet, Take by mouth 2 (two) times daily with a meal., Disp: , Rfl:  .  Multiple Vitamin (MULTIVITAMIN) tablet, Take 1 tablet by mouth daily., Disp: , Rfl:  .  ondansetron (ZOFRAN ODT) 4 MG disintegrating tablet, Take 1 tablet (4 mg total) by mouth every 8 (eight) hours as needed for nausea or vomiting., Disp: 20 tablet, Rfl: 0 .  oxyCODONE (ROXICODONE) 5 MG immediate release tablet, Take 1-2 tablets (5-10 mg total) by mouth  every 4 (four) hours as needed (pain)., Disp: 45 tablet, Rfl: 0 .  oxyCODONE ER (XTAMPZA ER) 27 MG C12A, Take by mouth in the morning and at bedtime., Disp: , Rfl:  .  Oxycodone HCl 10 MG TABS, Take 10 mg by mouth every 4 (four) hours as needed., Disp: , Rfl:  .  Roflumilast (DALIRESP) 250 MCG TABS, Take by mouth at bedtime., Disp: , Rfl:  .  senna-docusate (SENOKOT-S) 8.6-50 MG tablet, Take 1 tablet by mouth 2 (two) times daily., Disp: , Rfl:   Imaging Review  Shoulder Imaging: Shoulder-R MR wo contrast: Results for orders placed during the hospital encounter of 09/10/20 MR SHOULDER RIGHT WO CONTRAST  Narrative CLINICAL DATA:  Chronic right shoulder pain and limited range of motion. No known injury.  EXAM: MRI OF THE RIGHT SHOULDER WITHOUT CONTRAST  TECHNIQUE: Multiplanar, multisequence MR imaging of the shoulder was performed. No intravenous contrast was administered.  COMPARISON:  Plain films right shoulder 04/05/2015.  FINDINGS: Rotator cuff: Intact. There is mild supraspinatus and infraspinatus tendinopathy.  Muscles:  Normal without atrophy or focal lesion  Biceps long head: The tendon is almost completely torn as it enters the glenohumeral joint. A thin fragment of tendon is seen in the superior aspect of the bicipital groove.  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 No subacromial/subdeltoid bursal fluid.  Glenohumeral Joint: Negative.  Labrum:  Intact.  Bones:  No fracture, contusion or focal lesion.  Other: None.  IMPRESSION: The long head of biceps is almost completely torn as it enters the glenohumeral joint. Only a thin fragment of tendon is seen in the bicipital groove.  Mild supraspinatus and infraspinatus tendinopathy without tear.  Moderate acromioclavicular osteoarthritis.   Electronically Signed By: Inge Rise M.D. On: 09/11/2020 11:38  Shoulder-R DG: Results for orders placed during the hospital encounter  of 04/05/15 DG Shoulder Right  Narrative CLINICAL DATA:  Acute onset right shoulder pain.  No known injury.  EXAM: RIGHT SHOULDER - 2+ VIEW  COMPARISON:  None.  FINDINGS: There is no evidence of fracture or dislocation. There is no evidence of arthropathy or other focal bone abnormality. Soft tissues are unremarkable.  Mild degenerative spurring and chondrocalcinosis seen involving the acromioclavicular joint. No other significant bone abnormality identified.  IMPRESSION: No acute findings.  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   Electronically Signed By: Earle Gell M.D. On: 04/05/2015 19:53  Lumbosacral Imaging: Lumbar DG Bending views: Results for orders placed during the hospital encounter of 05/13/18 DG Lumbar Spine Complete W/Bend  Narrative CLINICAL DATA:  Back pain.  EXAM: LUMBAR SPINE - COMPLETE WITH BENDING VIEWS  COMPARISON:  01/18/2005.  FINDINGS: Degenerative changes lumbar spine. No acute bony abnormality identified. No flexion or extension deformity noted. Aortoiliac atherosclerotic vascular calcification. Catheters noted over the abdomen.  IMPRESSION: 1. Diffuse degenerative change lumbar spine. No acute bony abnormality identified. No flexion or extension deformity noted. No acute bony abnormality.  2.  Aortoiliac atherosclerotic vascular disease.   Electronically Signed By: Marcello Moores  Register On:  05/14/2018 06:43        Sacroiliac Joint Imaging: Sacroiliac Joint DG: Results for orders placed during the hospital encounter of 05/13/18 DG Si Joints  Narrative CLINICAL DATA:  Back pain.  No known injury.  EXAM: BILATERAL SACROILIAC JOINTS - 3+ VIEW  COMPARISON:  2006.  FINDINGS: Degenerative changes lumbar spine, both SI joints, and both hips. Diffuse osteopenia. No acute bony abnormality identified. Sclerotic density noted over the left iliac region consistent with bone island, no change from prior  study of 01/18/2005. Sclerotic density noted the proximal left femur most consistent with a bone island.  IMPRESSION: Degenerative changes lumbar spine, both SI joints, both hips. No acute bony abnormality identified.   Electronically Signed By: Marcello Moores  Register On: 05/14/2018 06:38  Knee Imaging: Knee-R DG 1-2 views: Results for orders placed during the hospital encounter of 05/13/18 DG Knee 1-2 Views Right  Narrative CLINICAL DATA:  Bilateral knee pain. No known injury. Prior knee surgery.  EXAM: RIGHT KNEE - 1-2 VIEW  COMPARISON:  Comparison made to prior MRI report of 07/31/2000.  FINDINGS: Total right knee replacement. Hardware intact. Anatomic alignment. No acute bony abnormality. Peripheral vascular calcification.  IMPRESSION: 1.  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  2.  Peripheral vascular disease.   Electronically Signed By: Marcello Moores  Register On: 05/14/2018 06:34  Knee-L DG 1-2 views: Results for orders placed during the hospital encounter of 05/13/18 DG Knee 1-2 Views Left  Narrative CLINICAL DATA:  Bilateral knee pain. No known injury. Prior knee surgery.  EXAM: LEFT KNEE - 1-2 VIEW  COMPARISON:  No recent prior.  FINDINGS: Tricompartment degenerative change with chondrocalcinosis. Postsurgical changes proximal tibia. No acute bony abnormality. No evidence of fracture. Peripheral vascular calcification noted.  IMPRESSION: 1. Degenerative change with chondrocalcinosis. Postsurgical changes proximal tibia. No acute bony abnormality.  2.  Peripheral vascular disease.  Electronically Signed By: Marcello Moores  Register On: 05/14/2018 06:29  Complexity Note: Imaging results reviewed.             Allergies  Mr. Zahner has No Known Allergies.  Laboratory Chemistry Profile   Renal Lab Results  Component Value Date   BUN 9 10/06/2012   CREATININE 0.83 10/06/2012   GFRAA >60 10/06/2012   GFRNONAA >60 10/06/2012      Electrolytes Lab Results  Component Value Date   NA 137 10/06/2012   K 3.7 10/06/2012   CL 105 10/06/2012   CALCIUM 9.4 10/06/2012     Hepatic Lab Results  Component Value Date   AST 17 10/06/2012   ALT 25 10/06/2012   ALBUMIN 4.0 10/06/2012   ALKPHOS 154 (H) 10/06/2012     ID Lab Results  Component Value Date   SARSCOV2NAA NEGATIVE 09/26/2020     Bone No results found.   Endocrine Lab Results  Component Value Date   GLUCOSE 364 (H) 10/06/2012     Neuropathy No results found.   CNS No results found.   Inflammation (CRP: Acute  ESR: Chronic) No results found.   Rheumatology No results found.   Coagulation Lab Results  Component Value Date   PLT 233 10/06/2012     Cardiovascular Lab Results  Component Value Date   HGB 15.5 10/06/2012   HCT 46.7 10/06/2012     Screening Lab Results  Component Value Date   SARSCOV2NAA NEGATIVE 09/26/2020     Cancer No results found.   Allergens No results found.     Note: Lab results reviewed.  [place]  Past Surgical History:  Procedure Laterality Date  . KNEE SURGERY     multiple surgeries on both knees   Active Ambulatory Problems    Diagnosis Date Noted  . CKD (chronic kidney disease) stage 3, GFR 30-59 ml/min (HCC) 10/29/2017  . Biceps tendinitis 04/06/2015  . Diabetes mellitus, type 2 ([place]) 06/13/2015  . Obesity (BMI 30-39.9) 11/27/2015  . Tobacco use 06/13/2015  . Lumbar spondylosis 06/11/2018  . Left hip pain 06/11/2018  . Chronic pain of both knees 06/11/2018   Resolved Ambulatory Problems    Diagnosis Date Noted  . No Resolved Ambulatory Problems   Past Medical History:  Diagnosis Date  . Arthritis   . Diabetes mellitus without complication ([place])   . Emphysema/COPD ([place])   . Hyperlipidemia   . Hypertension   . Kidney disease   . Lumbar stenosis   . Osteoarthrosis   . Seizure ([place])    Constitutional  BMI Assessment: Estimated body mass index is 32.64 kg/m as calculated from the  following:   Height as of 09/28/20: '5\' 11"'  (1.803 m).   Weight as of 09/28/20: 234 lb (106.1 kg).  BMI Readings from Last 4 Encounters:  09/28/20 32.64 kg/m  02/22/20 33.61 kg/m  06/17/18 34.17 kg/m  06/11/18 33.75 kg/m   Wt Readings from Last 4 Encounters:  09/28/20 234 lb (106.1 kg)  02/22/20 241 lb (109.3 kg)  06/17/18 245 lb (111.1 kg)  06/11/18 242 lb (109.8 kg)   Note by: Gaspar Cola, MD Date: 01/01/2021; Time: 1:59 PM

## 2021-02-28 ENCOUNTER — Telehealth: Payer: Self-pay

## 2021-02-28 NOTE — Telephone Encounter (Signed)
Left message for patient to notify them that it is time to schedule annual low dose lung cancer screening CT scan. Instructed patient to call back ([phone]) to verify information and schedule.  

## 2021-03-28 ENCOUNTER — Telehealth: Payer: Self-pay

## 2021-03-28 NOTE — Telephone Encounter (Signed)
Left message for patient to notify them that it is time to schedule annual low dose lung cancer screening CT scan. Instructed patient to call back ([phone]) to verify information and schedule.  

## 2021-04-09 ENCOUNTER — Telehealth: Payer: Self-pay | Admitting: *Deleted

## 2021-04-09 NOTE — Telephone Encounter (Signed)
Left another message for patient to notify them that it is time to schedule annual low dose lung cancer screening CT scan. Instructed patient to call back [phone]) to verify information and schedule.

## 2021-05-22 ENCOUNTER — Other Ambulatory Visit: Payer: Self-pay | Admitting: Orthopedic Surgery

## 2021-05-22 DIAGNOSIS — M75111 Incomplete rotator cuff tear or rupture of right shoulder, not specified as traumatic: Secondary | ICD-10-CM

## 2021-06-11 ENCOUNTER — Other Ambulatory Visit: Payer: Self-pay | Admitting: Neurology

## 2021-06-11 DIAGNOSIS — Z982 Presence of cerebrospinal fluid drainage device: Secondary | ICD-10-CM

## 2021-06-12 ENCOUNTER — Other Ambulatory Visit: Payer: Self-pay | Admitting: Neurology

## 2021-06-12 DIAGNOSIS — Z982 Presence of cerebrospinal fluid drainage device: Secondary | ICD-10-CM

## 2021-06-15 ENCOUNTER — Ambulatory Visit
Admission: RE | Admit: 2021-06-15 | Discharge: 2021-06-15 | Disposition: A | Discharge status: Home / Self Care | Payer: Medicare Other | Source: Ambulatory Visit | Attending: Neurology | Admitting: Neurology

## 2021-06-15 ENCOUNTER — Ambulatory Visit
Admission: RE | Admit: 2021-06-15 | Discharge: 2021-06-15 | Disposition: A | Discharge status: Home / Self Care | Payer: Medicare Other | Source: Home / Self Care | Attending: Neurology | Admitting: Neurology

## 2021-06-15 ENCOUNTER — Other Ambulatory Visit: Payer: Self-pay

## 2021-06-15 DIAGNOSIS — Z982 Presence of cerebrospinal fluid drainage device: Secondary | ICD-10-CM | POA: Insufficient documentation

## 2021-06-15 IMAGING — CR DG ABDOMEN 1V
2 series · 2 of 2 positions shown · non-contrast
Comparison: No prior shunt series trauma correlation is made with
[DATE] CT head

CLINICAL DATA: Shunt series

EXAM:
SKULL - 1-3 VIEW; DG CERVICAL SPINE - 1 VIEW; ABDOMEN - 1 VIEW

[abdomen kub (1 of 2)]
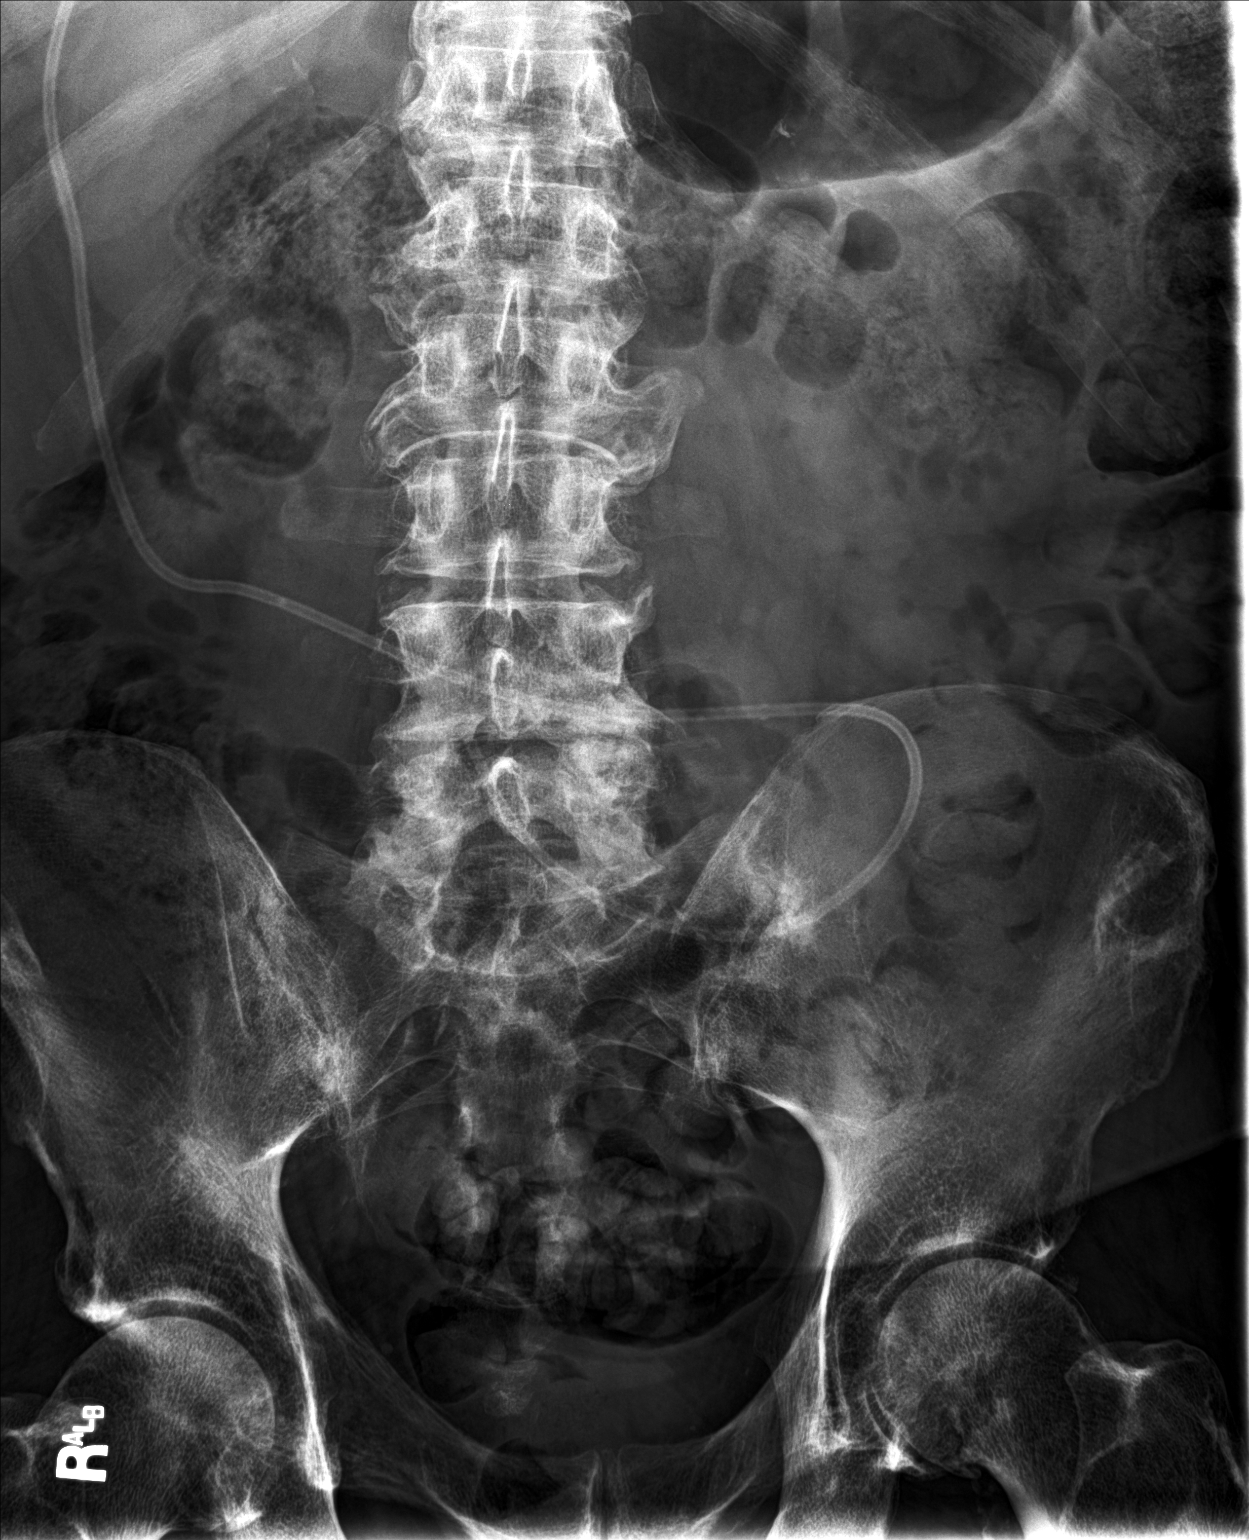

[abdomen kub (2 of 2)]
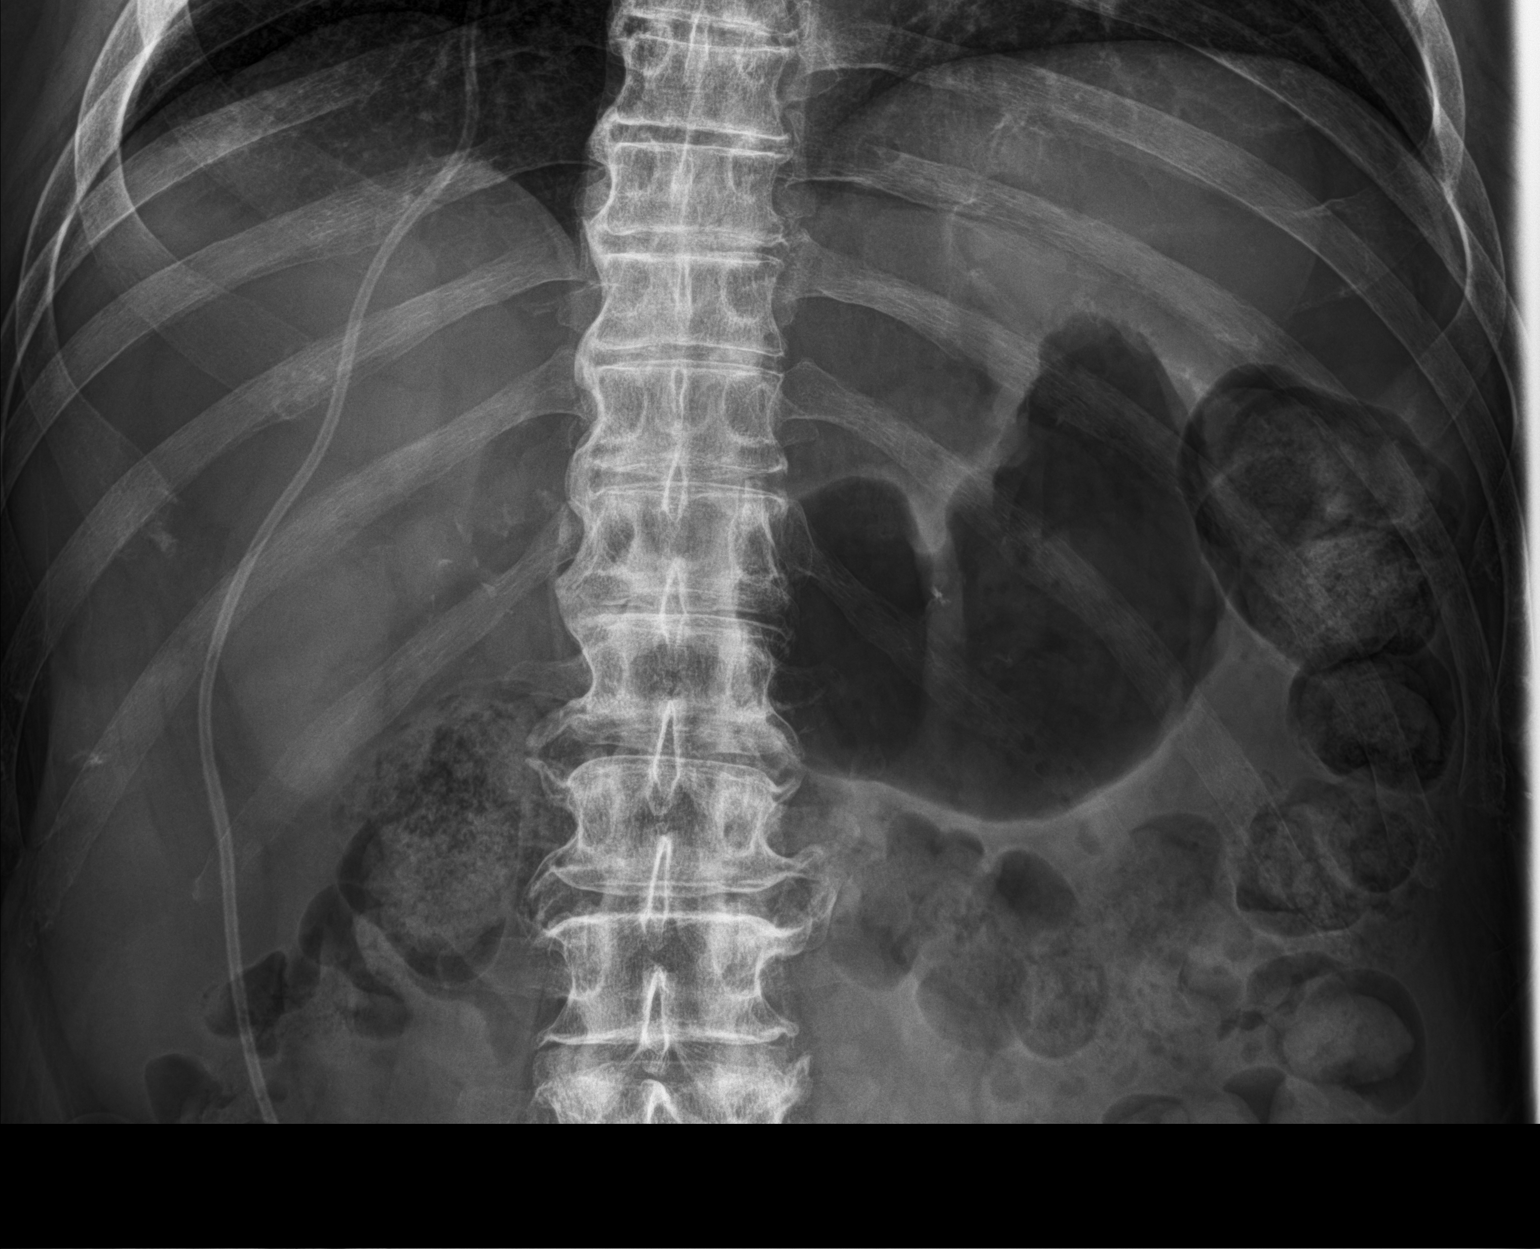

[2 of 2 positions shown; findings below may reference images not displayed]

FINDINGS: A ventriculoperitoneal shunt exits the skull via a right parietal
burr hole. It travels down the soft tissues of the right neck and
hemithorax prior to terminating in the mid lower abdomen. Mild
calcification about the shunt tubing in the neck.

There is no shunt discontinuity or kinking. There is no mass effect
at the shunt tip to suggest a CSF pseudocyst.

The lungs are clear. The bowel gas pattern is normal.
IMPRESSION: Ventriculoperitoneal shunt without radiographic evidence of shunt
malfunction.

## 2021-06-15 IMAGING — CR DG CERVICAL SPINE 1V
1 series · 1 of 1 positions shown · non-contrast
Comparison: No prior shunt series trauma correlation is made with
[DATE] CT head

CLINICAL DATA: Shunt series

EXAM:
SKULL - 1-3 VIEW; DG CERVICAL SPINE - 1 VIEW; ABDOMEN - 1 VIEW

[c-spine ap]
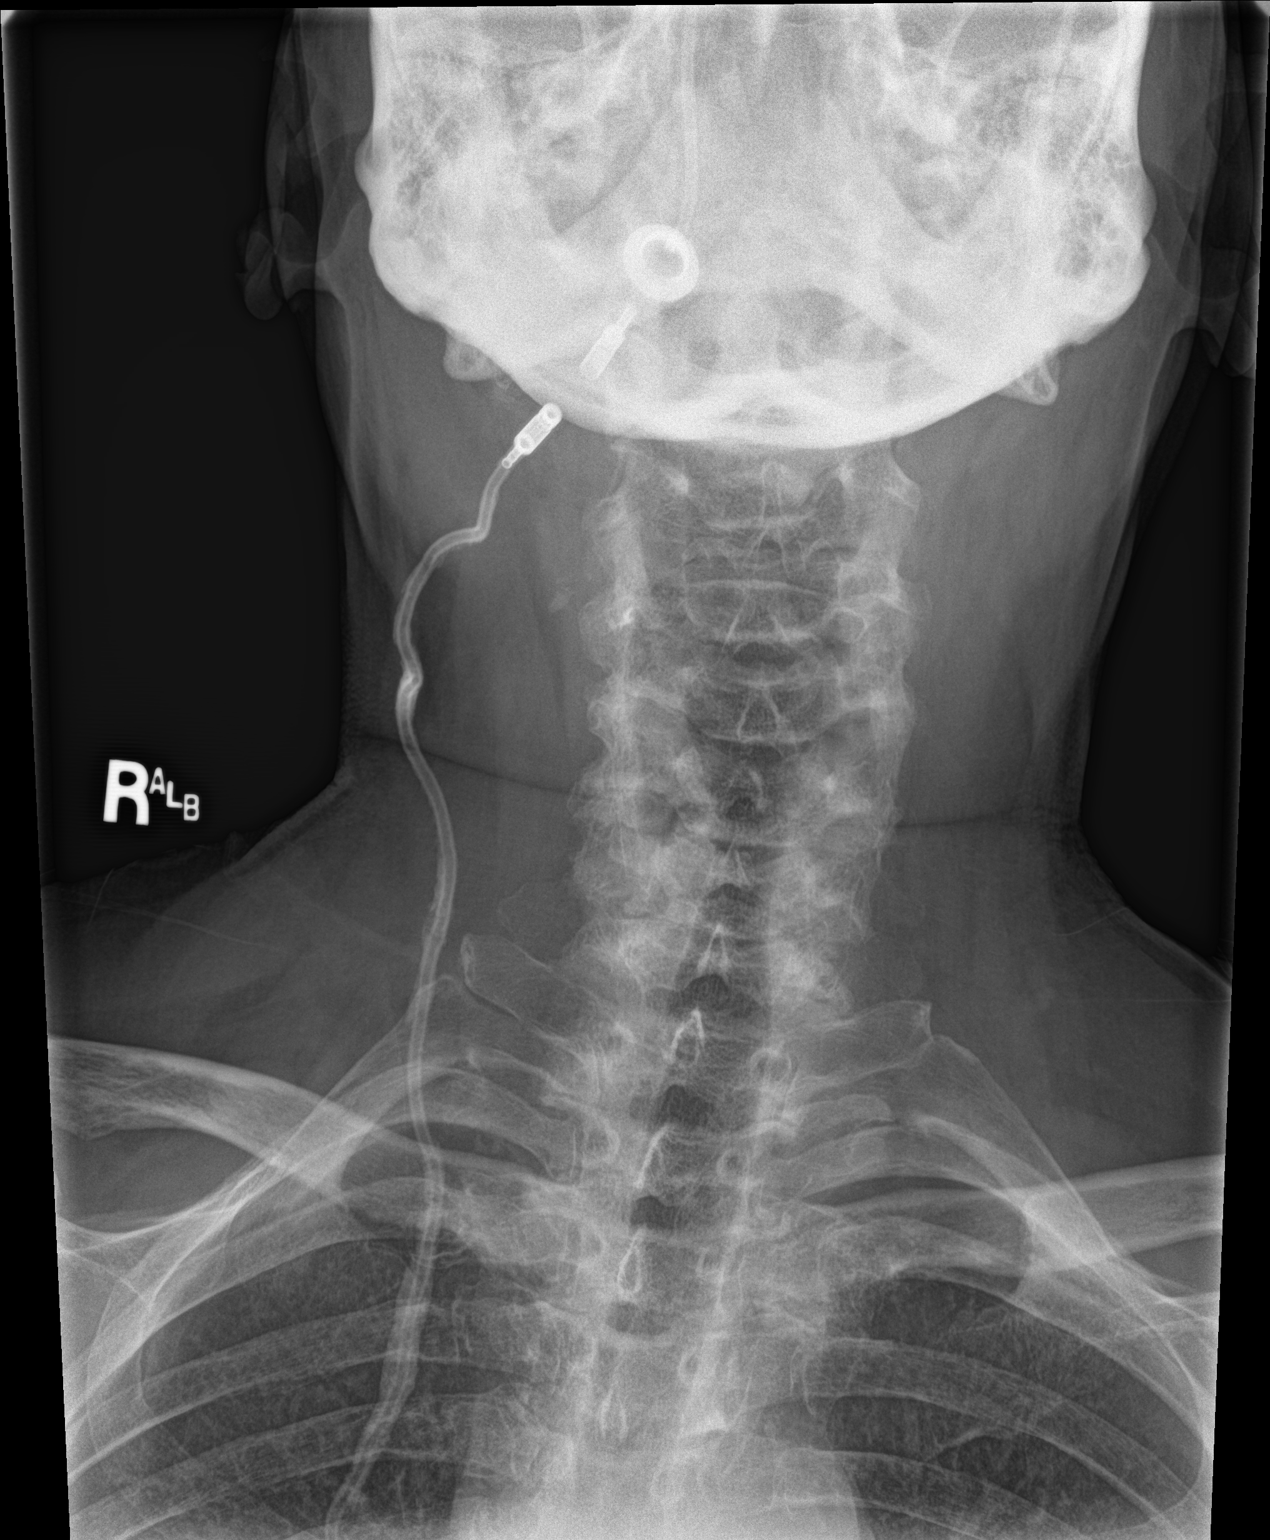

[1 of 1 positions shown; findings below may reference images not displayed]

FINDINGS: A ventriculoperitoneal shunt exits the skull via a right parietal
burr hole. It travels down the soft tissues of the right neck and
hemithorax prior to terminating in the mid lower abdomen. Mild
calcification about the shunt tubing in the neck.

There is no shunt discontinuity or kinking. There is no mass effect
at the shunt tip to suggest a CSF pseudocyst.

The lungs are clear. The bowel gas pattern is normal.
IMPRESSION: Ventriculoperitoneal shunt without radiographic evidence of shunt
malfunction.

## 2021-06-15 IMAGING — CR DG SKULL 1-3V
2 series · 2 of 2 positions shown · non-contrast
Comparison: No prior shunt series trauma correlation is made with
[DATE] CT head

CLINICAL DATA: Shunt series

EXAM:
SKULL - 1-3 VIEW; DG CERVICAL SPINE - 1 VIEW; ABDOMEN - 1 VIEW

[skull caldwel]
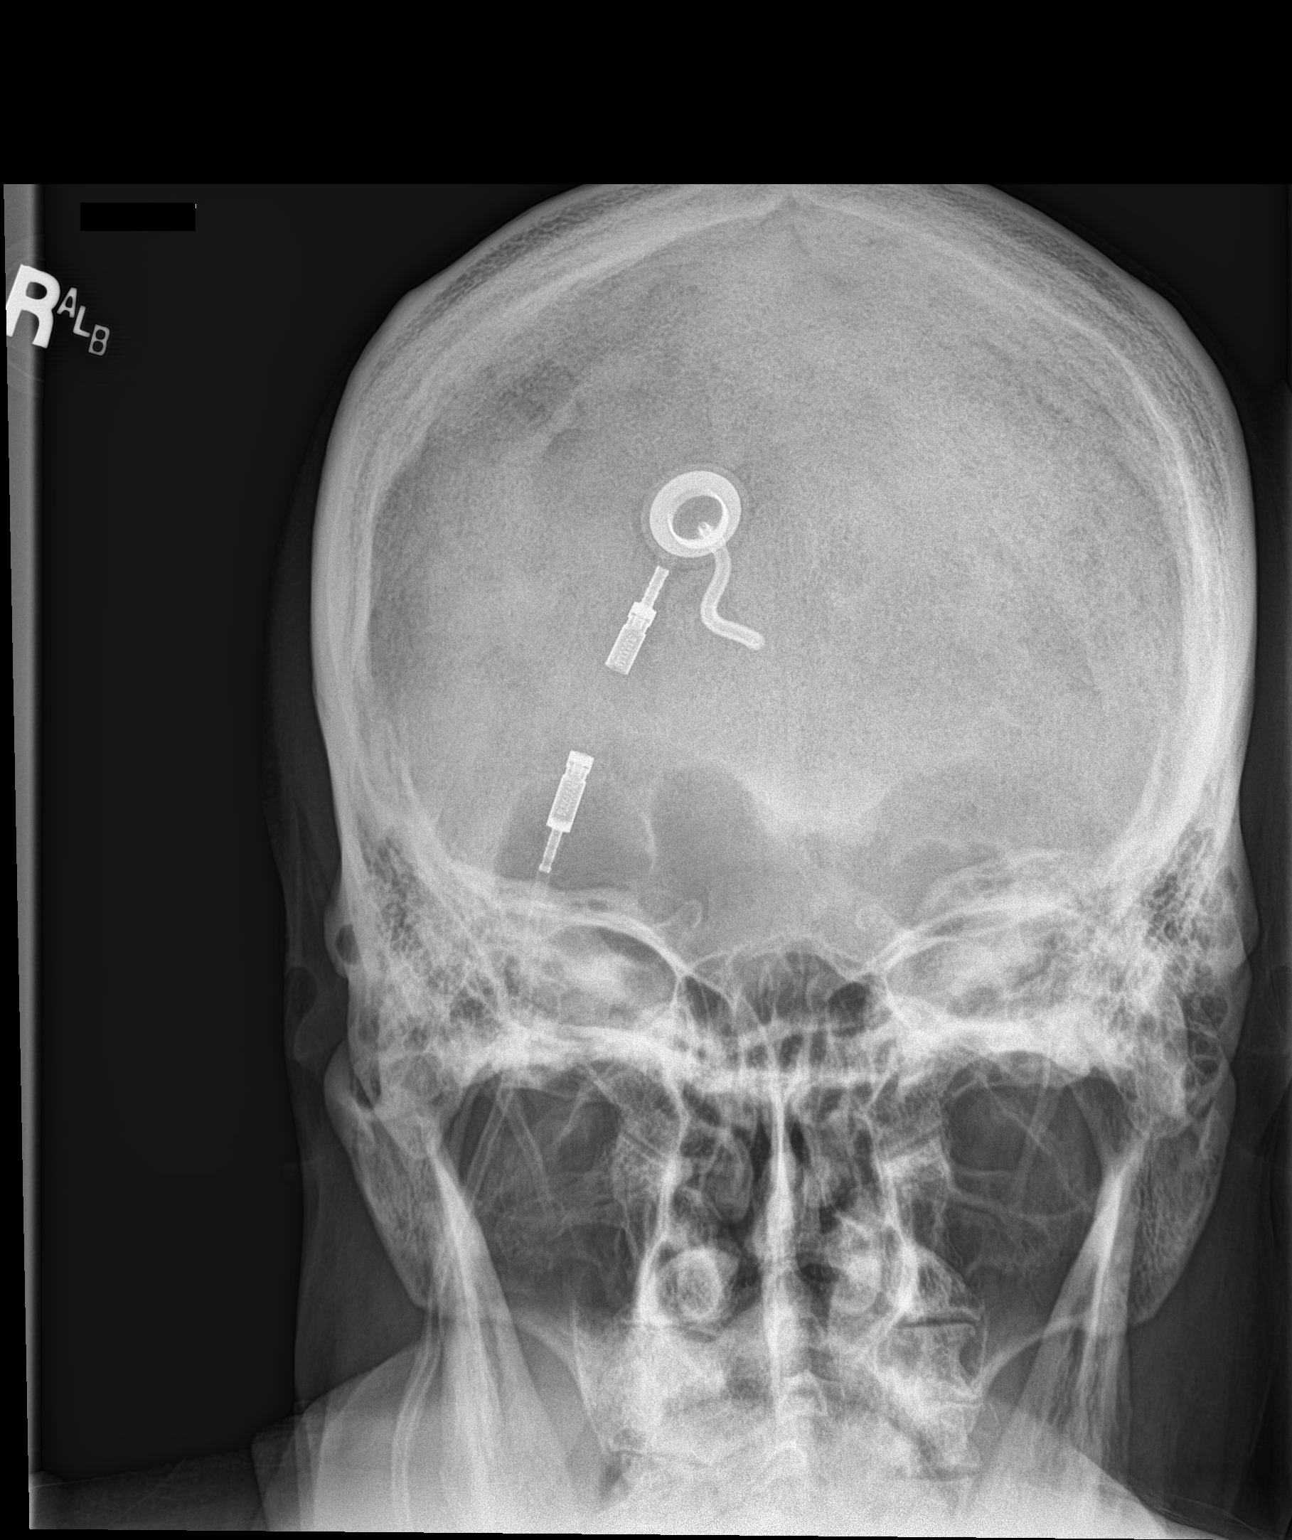

[orbits lat]
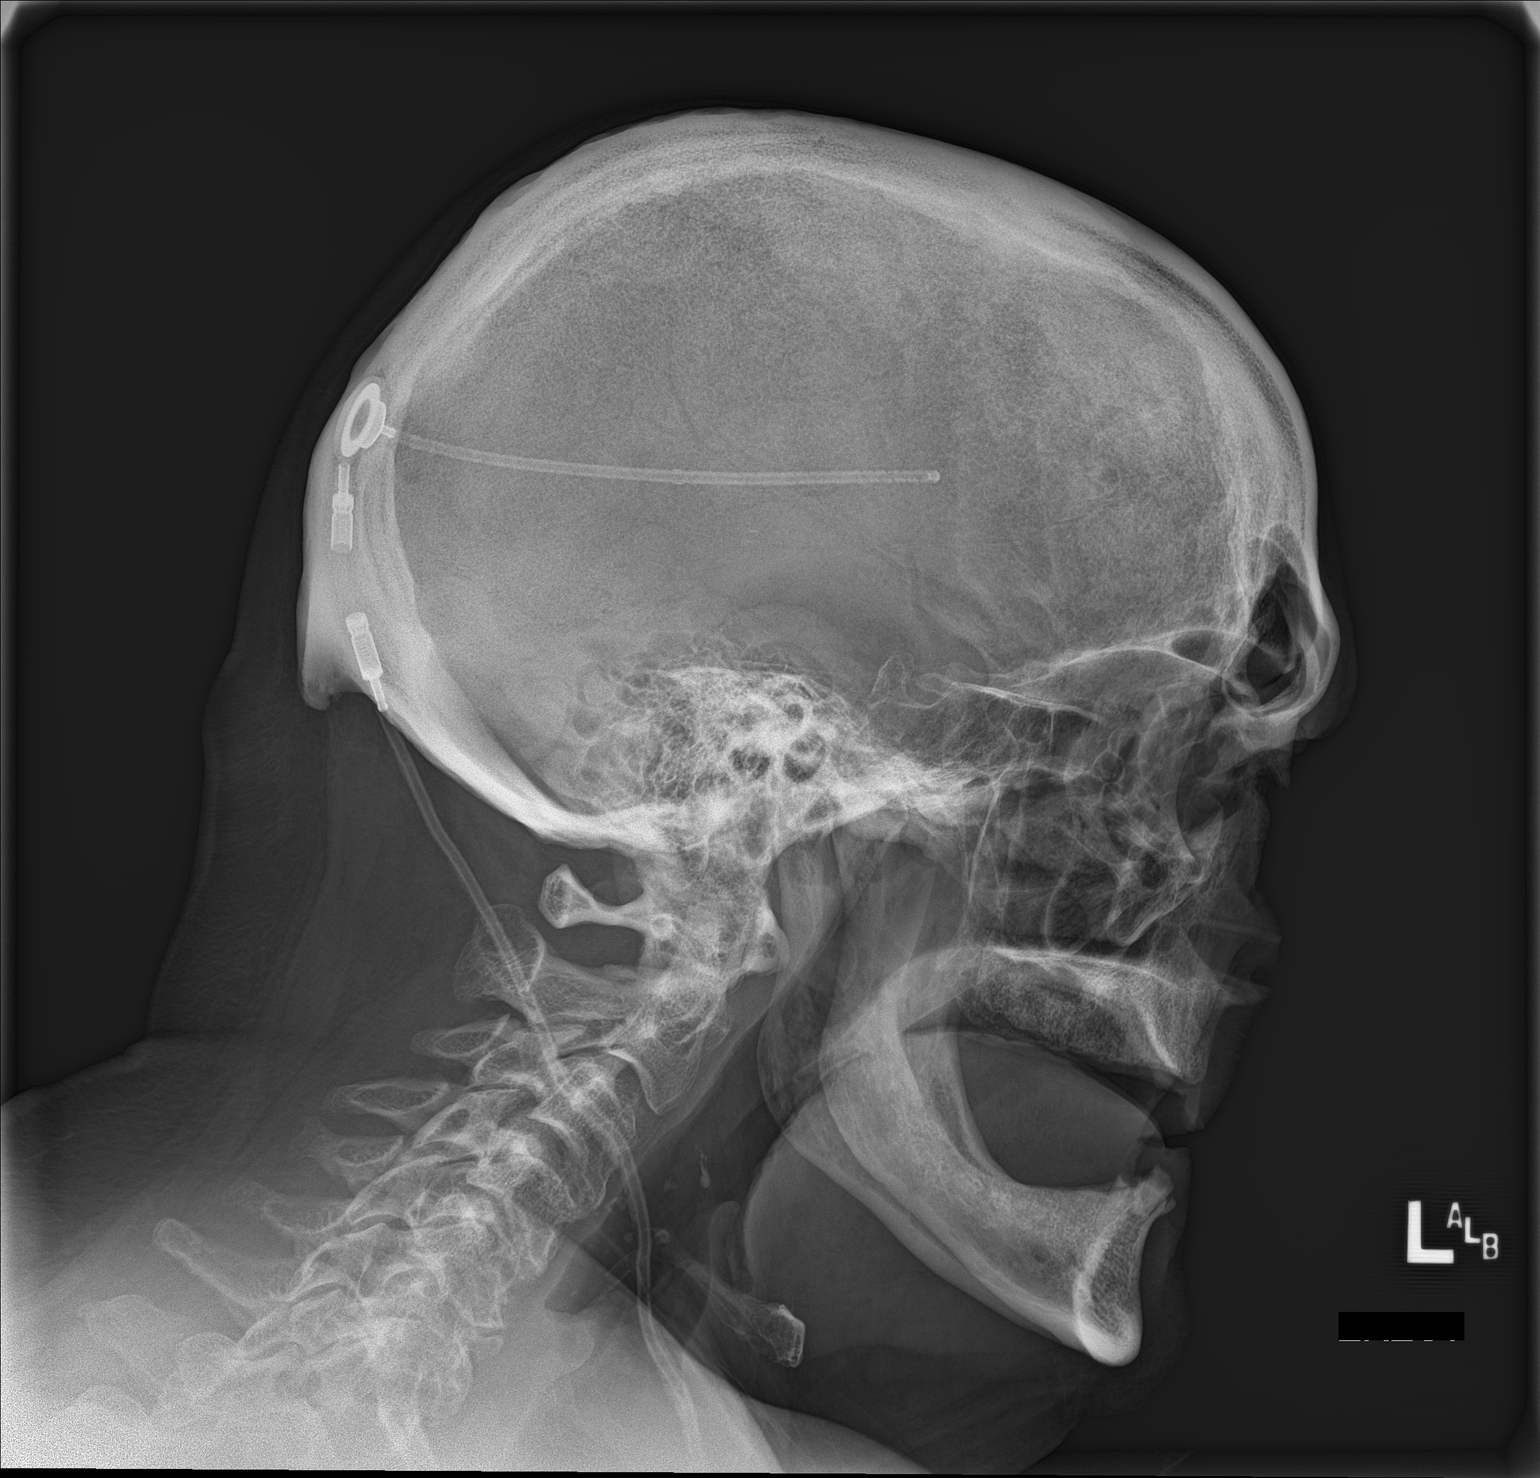

[2 of 2 positions shown; findings below may reference images not displayed]

FINDINGS: A ventriculoperitoneal shunt exits the skull via a right parietal
burr hole. It travels down the soft tissues of the right neck and
hemithorax prior to terminating in the mid lower abdomen. Mild
calcification about the shunt tubing in the neck.

There is no shunt discontinuity or kinking. There is no mass effect
at the shunt tip to suggest a CSF pseudocyst.

The lungs are clear. The bowel gas pattern is normal.
IMPRESSION: Ventriculoperitoneal shunt without radiographic evidence of shunt
malfunction.

## 2021-06-15 IMAGING — MR MR HEAD W/O CM
11 series · 48 of 48 positions shown · non-contrast
Comparison: No prior MRI available. Correlation is made with CT
head [DATE]

CLINICAL DATA: Shunt times 35 years, short-term memory loss, neuro
wants new imaging

EXAM:
MRI HEAD WITHOUT CONTRAST
TECHNIQUE: Multiplanar, multiecho pulse sequences of the brain and surrounding
structures were obtained without intravenous contrast.

[Series 2: DWI · axial · 3.0mm · 1.20mm/px · z∈[-32,+134]mm · 7 of 117 slices shown (1 of 4)]
[im 1/117]
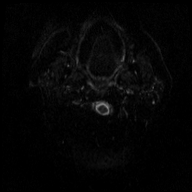
[im 20/117]
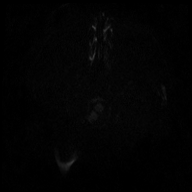
[im 39/117]
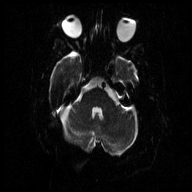
[im 59/117]
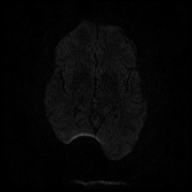
[im 78/117]
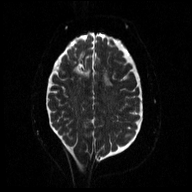
[im 97/117]
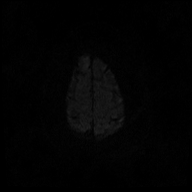
[im 117/117]
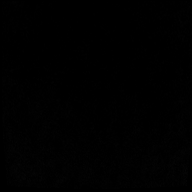

[Series 3: DWI · axial · 3.0mm · 1.20mm/px · z∈[-32,+134]mm · 4 of 58 slices shown (2 of 4)]
[im 1/58]
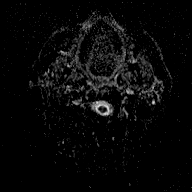
[im 20/58]
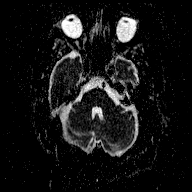
[im 39/58]
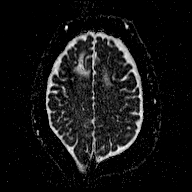
[im 58/58]
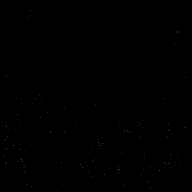

[Series 4: DWI · coronal · 3.0mm · 1.15mm/px · 7 of 96 slices shown (3 of 4)]
[im 1/96]
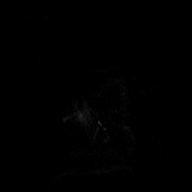
[im 16/96]
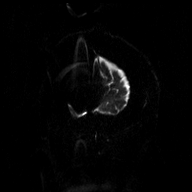
[im 32/96]
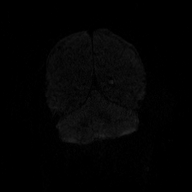
[im 48/96]
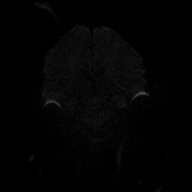
[im 64/96]
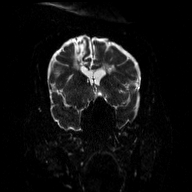
[im 80/96]
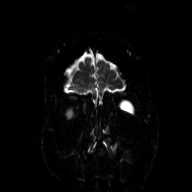
[im 96/96]
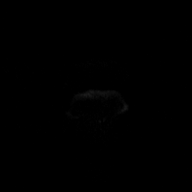

[Series 5: DWI · coronal · 3.0mm · 1.15mm/px · 4 of 54 slices shown (4 of 4)]
[im 1/54]
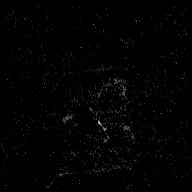
[im 18/54]
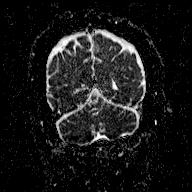
[im 36/54]
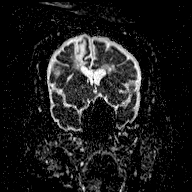
[im 54/54]
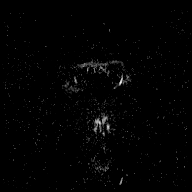

[Series 6: T1 · sagittal · 5.0mm · 0.45mm/px · 2 of 25 slices shown (1 of 2)]
[im 1/25]
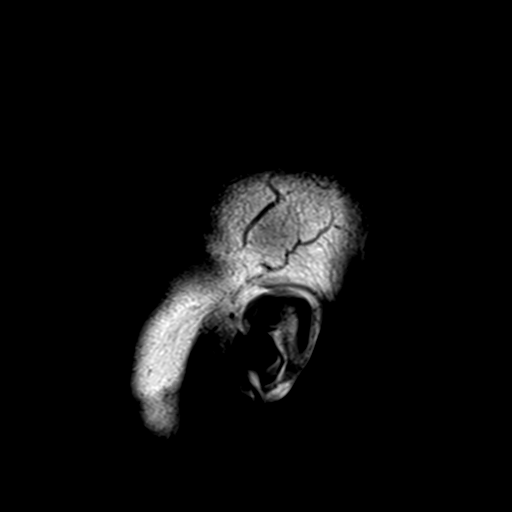
[im 25/25]
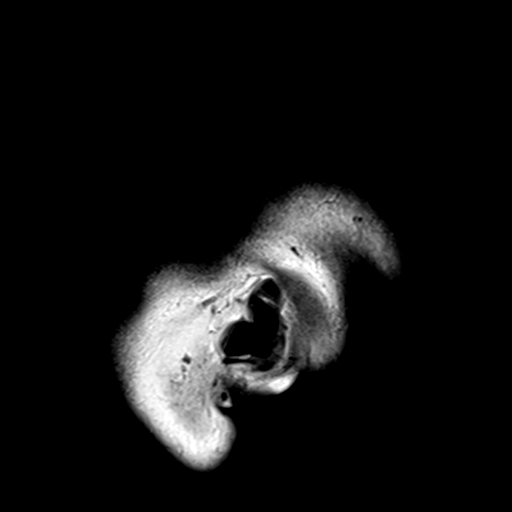

[Series 7: T2 · axial · 5.0mm · 0.72mm/px · z∈[-36,+138]mm · 2 of 27 slices shown (1 of 4)]
[im 1/27]
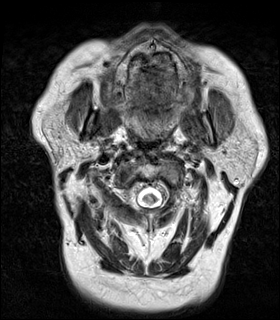
[im 27/27]
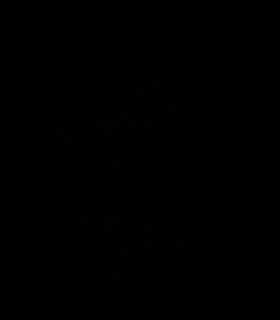

[Series 8: FLAIR · axial · 3.0mm · 0.45mm/px · z∈[-26,+129]mm · 4 of 55 slices shown]
[im 1/55]
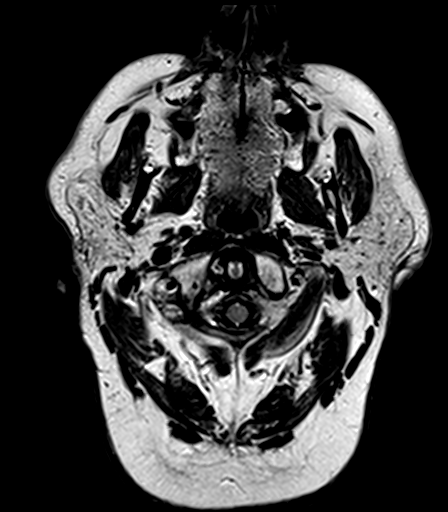
[im 19/55]
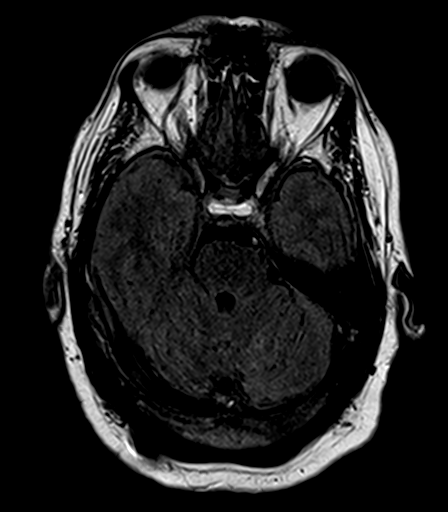
[im 37/55]
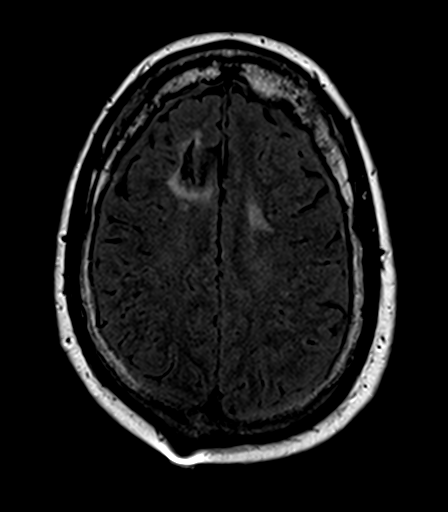
[im 55/55]
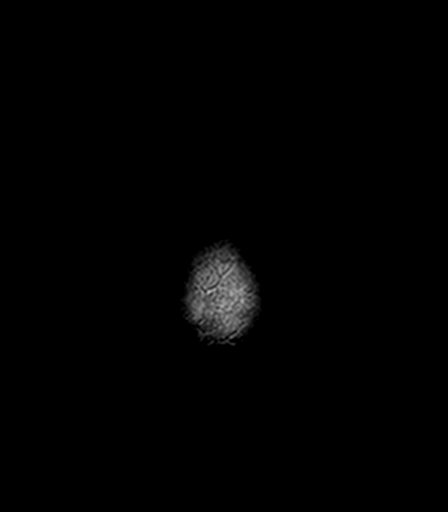

[Series 9: T2 · axial · 5.0mm · 0.72mm/px · z∈[-36,+138]mm · 2 of 27 slices shown (2 of 4)]
[im 1/27]
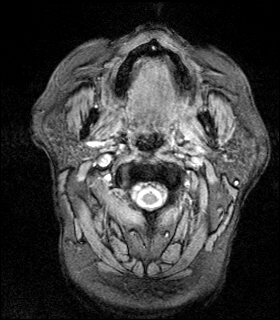
[im 27/27]
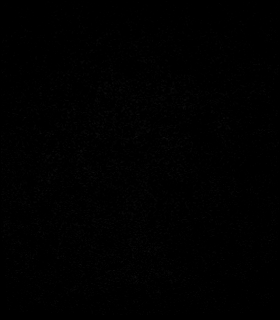

[Series 10: T1 · axial · 1.0mm · 0.94mm/px · z∈[-31,+136]mm · 12 of 176 slices shown (2 of 2)]
[im 1/176]
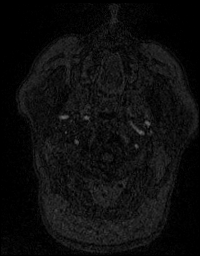
[im 16/176]
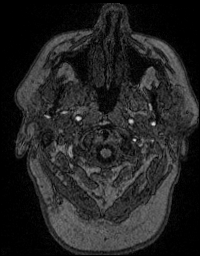
[im 32/176]
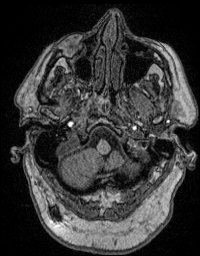
[im 48/176]
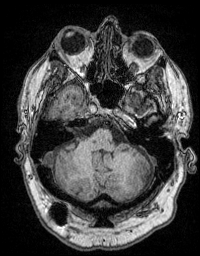
[im 64/176]
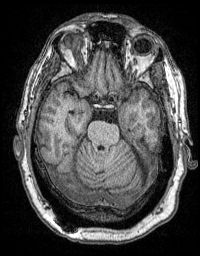
[im 80/176]
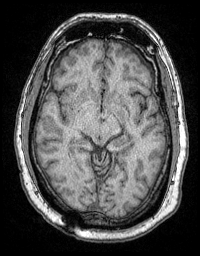
[im 96/176]
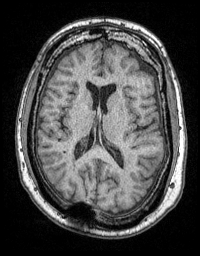
[im 112/176]
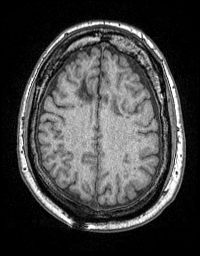
[im 128/176]
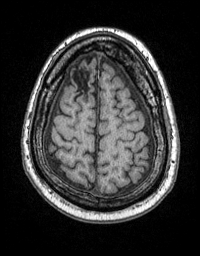
[im 144/176]
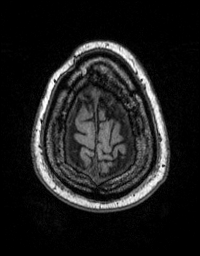
[im 160/176]
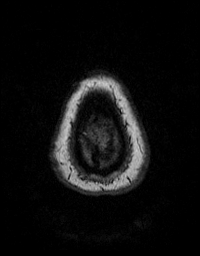
[im 176/176]
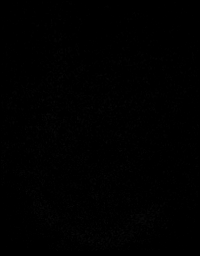

[Series 11: T2 · coronal · 5.0mm · 0.43mm/px · 2 of 33 slices shown (3 of 4)]
[im 1/33]
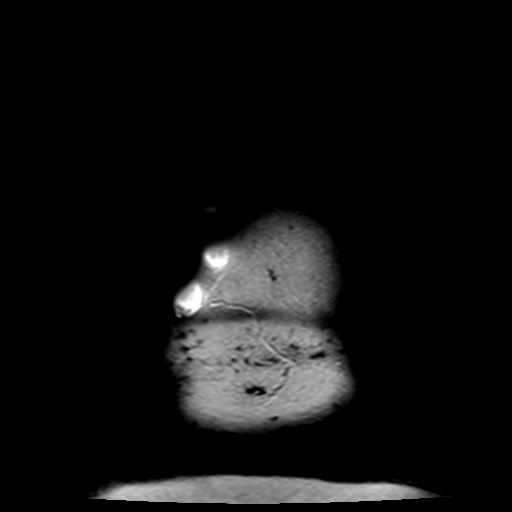
[im 33/33]
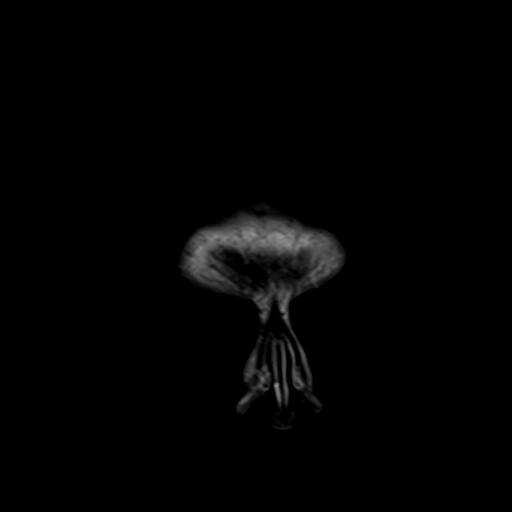

[Series 12: T2 · axial · 5.0mm · 0.72mm/px · z∈[-36,+138]mm · 2 of 27 slices shown (4 of 4)]
[im 1/27]
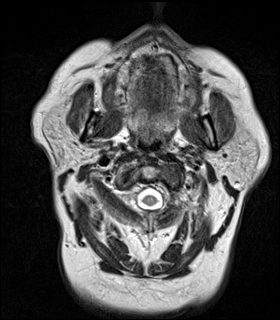
[im 27/27]
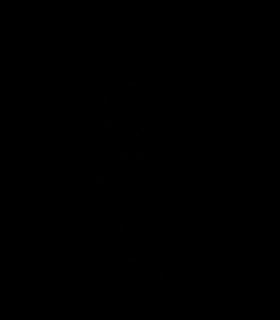

[48 of 48 positions shown; findings below may reference images not displayed]

FINDINGS: Brain: Right medial parietal approach ventriculostomy shunt, with
catheter tip in the body of the right lateral ventricle. Ventricle
size is within normal limits for age, however this has increased in
size since [U5]. Encephalomalacia in the bilateral frontal lobes,
which appear similar to the prior exam. No acute infarct,
hemorrhage, cerebral edema, mass, mass effect, or midline shift.
Mild crowding of the foramen magnum, without definite cerebellar
ectopia.

Vascular: Normal flow voids.

Skull and upper cervical spine: Right parietal burr hole. Right
frontal burr hole. No osseous lesion.

Sinuses/Orbits: Negative.

Other: Trace fluid in right mastoid tip.
IMPRESSION: 1. Unchanged position of right parietal approach ventriculostomy
catheter, with slight increase in the size of the ventricular system
compared to [U5], although this remains within normal limits.
2. No acute intracranial process.

## 2021-06-15 IMAGING — CR DG CHEST 1V
2 series · 2 of 2 positions shown · non-contrast
Comparison: None.

CLINICAL DATA: 65-year-old male with prior cerebral shunt

EXAM:
CHEST  1 VIEW

[chest ap (1 of 2)]
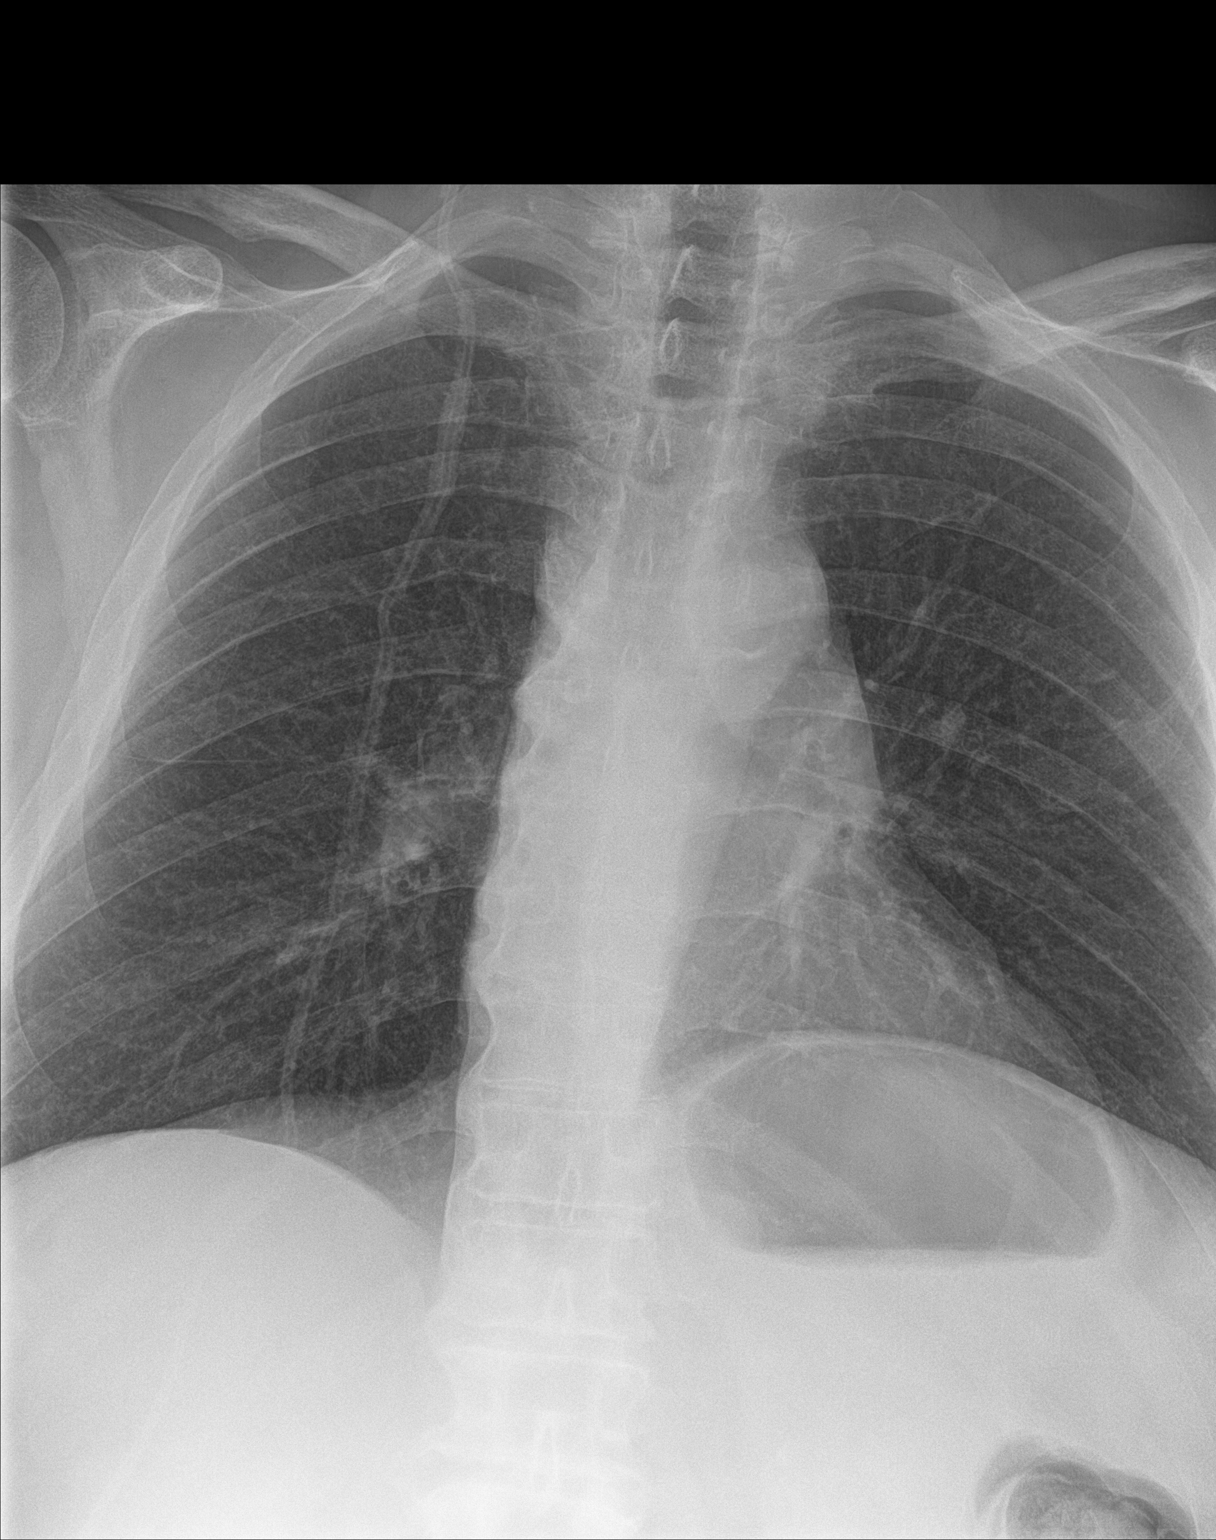

[chest ap (2 of 2)]
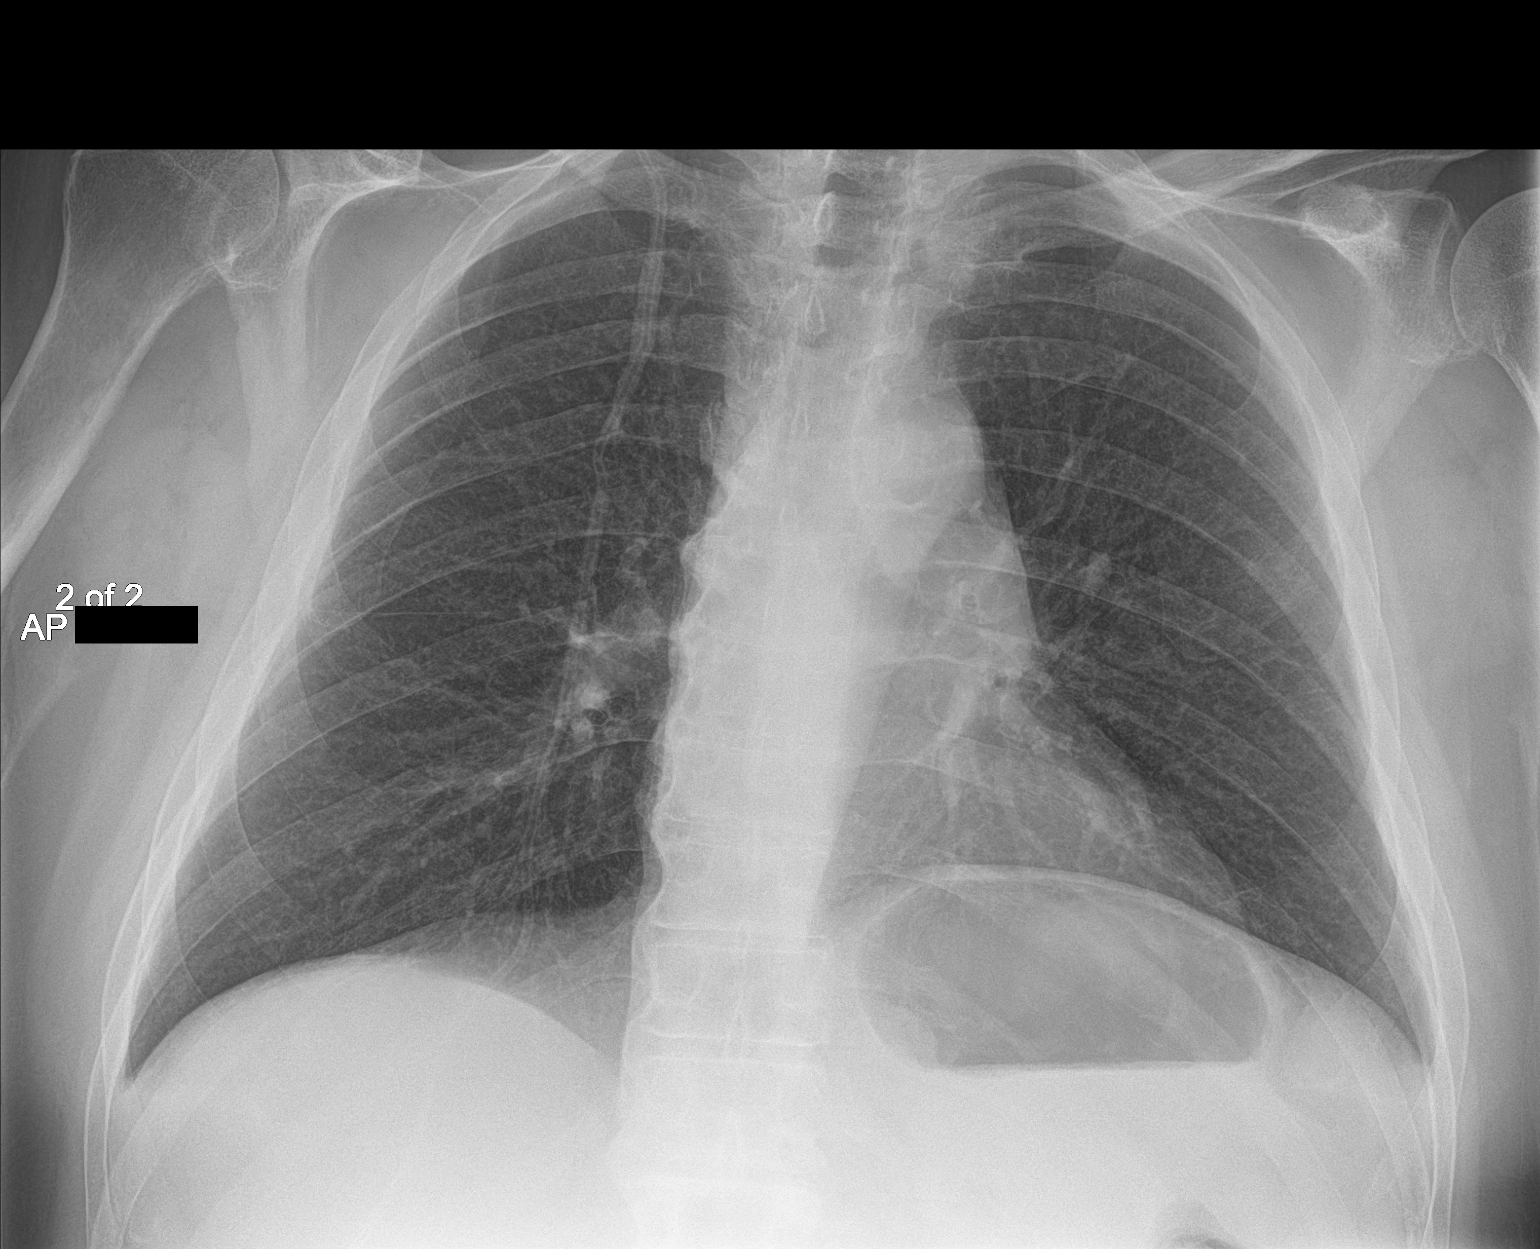

[2 of 2 positions shown; findings below may reference images not displayed]

FINDINGS: Cardiomediastinal silhouette within normal limits in size and
contour. No evidence of central vascular congestion. No interlobular
septal thickening.

Surgical shunt tubing with associated calcifications overlying the
right chest wall.

No pneumothorax or pleural effusion. Coarsened interstitial
markings, with no confluent airspace disease.

No acute displaced fracture. Degenerative changes of the spine.
IMPRESSION: Negative for acute cardiopulmonary disease.

Surgical shunt tubing overlying the right chest

## 2022-04-19 ENCOUNTER — Ambulatory Visit
Admission: RE | Admit: 2022-04-19 | Discharge: 2022-04-19 | Disposition: A | Discharge status: Home / Self Care | Payer: Medicare Other | Source: Ambulatory Visit | Attending: Anesthesiology | Admitting: Anesthesiology

## 2022-04-19 ENCOUNTER — Other Ambulatory Visit: Payer: Self-pay | Admitting: Anesthesiology

## 2022-04-19 ENCOUNTER — Ambulatory Visit
Admission: RE | Admit: 2022-04-19 | Discharge: 2022-04-19 | Disposition: A | Discharge status: Home / Self Care | Payer: Medicare Other | Attending: Anesthesiology | Admitting: Anesthesiology

## 2022-04-19 DIAGNOSIS — M545 Low back pain, unspecified: Secondary | ICD-10-CM | POA: Diagnosis present

## 2022-04-19 DIAGNOSIS — M5136 Other intervertebral disc degeneration, lumbar region: Secondary | ICD-10-CM | POA: Diagnosis present

## 2022-04-19 DIAGNOSIS — M51369 Other intervertebral disc degeneration, lumbar region without mention of lumbar back pain or lower extremity pain: Secondary | ICD-10-CM

## 2022-07-05 ENCOUNTER — Observation Stay: Payer: Medicare Other

## 2022-07-05 ENCOUNTER — Inpatient Hospital Stay
Admission: EM | Admit: 2022-07-05 | Discharge: 2022-07-10 | DRG: 602 | Disposition: A | Discharge status: Skilled Nursing Facility | Payer: Medicare Other | Attending: Internal Medicine | Admitting: Internal Medicine

## 2022-07-05 ENCOUNTER — Other Ambulatory Visit: Payer: Self-pay

## 2022-07-05 ENCOUNTER — Emergency Department: Payer: Medicare Other

## 2022-07-05 ENCOUNTER — Encounter: Payer: Self-pay | Admitting: Emergency Medicine

## 2022-07-05 DIAGNOSIS — E1122 Type 2 diabetes mellitus with diabetic chronic kidney disease: Secondary | ICD-10-CM | POA: Diagnosis present

## 2022-07-05 DIAGNOSIS — E1129 Type 2 diabetes mellitus with other diabetic kidney complication: Secondary | ICD-10-CM | POA: Diagnosis present

## 2022-07-05 DIAGNOSIS — G928 Other toxic encephalopathy: Secondary | ICD-10-CM | POA: Diagnosis present

## 2022-07-05 DIAGNOSIS — G8929 Other chronic pain: Secondary | ICD-10-CM | POA: Diagnosis present

## 2022-07-05 DIAGNOSIS — F17210 Nicotine dependence, cigarettes, uncomplicated: Secondary | ICD-10-CM | POA: Diagnosis present

## 2022-07-05 DIAGNOSIS — Z833 Family history of diabetes mellitus: Secondary | ICD-10-CM

## 2022-07-05 DIAGNOSIS — G929 Unspecified toxic encephalopathy: Secondary | ICD-10-CM | POA: Diagnosis present

## 2022-07-05 DIAGNOSIS — W19XXXA Unspecified fall, initial encounter: Principal | ICD-10-CM

## 2022-07-05 DIAGNOSIS — G629 Polyneuropathy, unspecified: Secondary | ICD-10-CM

## 2022-07-05 DIAGNOSIS — Z6825 Body mass index (BMI) 25.0-25.9, adult: Secondary | ICD-10-CM

## 2022-07-05 DIAGNOSIS — Z79891 Long term (current) use of opiate analgesic: Secondary | ICD-10-CM

## 2022-07-05 DIAGNOSIS — R52 Pain, unspecified: Secondary | ICD-10-CM | POA: Diagnosis present

## 2022-07-05 DIAGNOSIS — N184 Chronic kidney disease, stage 4 (severe): Secondary | ICD-10-CM | POA: Diagnosis not present

## 2022-07-05 DIAGNOSIS — D539 Nutritional anemia, unspecified: Secondary | ICD-10-CM

## 2022-07-05 DIAGNOSIS — R2689 Other abnormalities of gait and mobility: Secondary | ICD-10-CM | POA: Diagnosis present

## 2022-07-05 DIAGNOSIS — D509 Iron deficiency anemia, unspecified: Secondary | ICD-10-CM | POA: Diagnosis present

## 2022-07-05 DIAGNOSIS — R55 Syncope and collapse: Secondary | ICD-10-CM | POA: Diagnosis not present

## 2022-07-05 DIAGNOSIS — W109XXA Fall (on) (from) unspecified stairs and steps, initial encounter: Secondary | ICD-10-CM | POA: Diagnosis present

## 2022-07-05 DIAGNOSIS — Z982 Presence of cerebrospinal fluid drainage device: Secondary | ICD-10-CM

## 2022-07-05 DIAGNOSIS — G894 Chronic pain syndrome: Secondary | ICD-10-CM | POA: Diagnosis not present

## 2022-07-05 DIAGNOSIS — Z1152 Encounter for screening for COVID-19: Secondary | ICD-10-CM

## 2022-07-05 DIAGNOSIS — Z888 Allergy status to other drugs, medicaments and biological substances status: Secondary | ICD-10-CM

## 2022-07-05 DIAGNOSIS — L03113 Cellulitis of right upper limb: Secondary | ICD-10-CM | POA: Diagnosis not present

## 2022-07-05 DIAGNOSIS — Z794 Long term (current) use of insulin: Secondary | ICD-10-CM

## 2022-07-05 DIAGNOSIS — Z8661 Personal history of infections of the central nervous system: Secondary | ICD-10-CM

## 2022-07-05 DIAGNOSIS — I10 Essential (primary) hypertension: Secondary | ICD-10-CM | POA: Diagnosis present

## 2022-07-05 DIAGNOSIS — M1990 Unspecified osteoarthritis, unspecified site: Secondary | ICD-10-CM | POA: Diagnosis present

## 2022-07-05 DIAGNOSIS — E1140 Type 2 diabetes mellitus with diabetic neuropathy, unspecified: Secondary | ICD-10-CM | POA: Diagnosis present

## 2022-07-05 DIAGNOSIS — Z7951 Long term (current) use of inhaled steroids: Secondary | ICD-10-CM

## 2022-07-05 DIAGNOSIS — Z720 Tobacco use: Secondary | ICD-10-CM | POA: Diagnosis present

## 2022-07-05 DIAGNOSIS — J449 Chronic obstructive pulmonary disease, unspecified: Secondary | ICD-10-CM | POA: Diagnosis present

## 2022-07-05 DIAGNOSIS — M48061 Spinal stenosis, lumbar region without neurogenic claudication: Secondary | ICD-10-CM | POA: Diagnosis present

## 2022-07-05 DIAGNOSIS — G9341 Metabolic encephalopathy: Secondary | ICD-10-CM | POA: Diagnosis present

## 2022-07-05 DIAGNOSIS — D696 Thrombocytopenia, unspecified: Secondary | ICD-10-CM | POA: Diagnosis present

## 2022-07-05 DIAGNOSIS — E663 Overweight: Secondary | ICD-10-CM | POA: Diagnosis present

## 2022-07-05 DIAGNOSIS — Y92009 Unspecified place in unspecified non-institutional (private) residence as the place of occurrence of the external cause: Secondary | ICD-10-CM

## 2022-07-05 DIAGNOSIS — R509 Fever, unspecified: Secondary | ICD-10-CM

## 2022-07-05 DIAGNOSIS — Z79899 Other long term (current) drug therapy: Secondary | ICD-10-CM

## 2022-07-05 DIAGNOSIS — T40605A Adverse effect of unspecified narcotics, initial encounter: Secondary | ICD-10-CM | POA: Diagnosis present

## 2022-07-05 DIAGNOSIS — J439 Emphysema, unspecified: Secondary | ICD-10-CM | POA: Diagnosis present

## 2022-07-05 DIAGNOSIS — M25531 Pain in right wrist: Secondary | ICD-10-CM

## 2022-07-05 DIAGNOSIS — Z809 Family history of malignant neoplasm, unspecified: Secondary | ICD-10-CM

## 2022-07-05 DIAGNOSIS — I129 Hypertensive chronic kidney disease with stage 1 through stage 4 chronic kidney disease, or unspecified chronic kidney disease: Secondary | ICD-10-CM | POA: Diagnosis present

## 2022-07-05 DIAGNOSIS — E785 Hyperlipidemia, unspecified: Secondary | ICD-10-CM | POA: Diagnosis present

## 2022-07-05 DIAGNOSIS — G40909 Epilepsy, unspecified, not intractable, without status epilepticus: Secondary | ICD-10-CM | POA: Diagnosis present

## 2022-07-05 LAB — LACTIC ACID, PLASMA
Lactic Acid, Venous: 1.1 mmol/L (ref 0.5–1.9)
Lactic Acid, Venous: 1.8 mmol/L (ref 0.5–1.9)

## 2022-07-05 LAB — CBC WITH DIFFERENTIAL/PLATELET
Abs Immature Granulocytes: 0.03 10*3/uL (ref 0.00–0.07)
Basophils Absolute: 0 10*3/uL (ref 0.0–0.1)
Basophils Relative: 1 %
Eosinophils Absolute: 0.1 10*3/uL (ref 0.0–0.5)
Eosinophils Relative: 1 %
HCT: 33.9 % — ABNORMAL LOW (ref 39.0–52.0)
Hemoglobin: 10.3 g/dL — ABNORMAL LOW (ref 13.0–17.0)
Immature Granulocytes: 1 %
Lymphocytes Relative: 18 %
Lymphs Abs: 1.2 10*3/uL (ref 0.7–4.0)
MCH: 31.4 pg (ref 26.0–34.0)
MCHC: 30.4 g/dL (ref 30.0–36.0)
MCV: 103.4 fL — ABNORMAL HIGH (ref 80.0–100.0)
Monocytes Absolute: 1 10*3/uL (ref 0.1–1.0)
Monocytes Relative: 15 %
Neutro Abs: 4.1 10*3/uL (ref 1.7–7.7)
Neutrophils Relative %: 64 %
Platelets: 118 10*3/uL — ABNORMAL LOW (ref 150–400)
RBC: 3.28 MIL/uL — ABNORMAL LOW (ref 4.22–5.81)
RDW: 16 % — ABNORMAL HIGH (ref 11.5–15.5)
WBC: 6.3 10*3/uL (ref 4.0–10.5)
nRBC: 0 % (ref 0.0–0.2)

## 2022-07-05 LAB — URINALYSIS, ROUTINE W REFLEX MICROSCOPIC
Bacteria, UA: NONE SEEN
Bilirubin Urine: NEGATIVE
Glucose, UA: >=500 mg/dL — AB
Hgb urine dipstick: NEGATIVE
Ketones, ur: NEGATIVE mg/dL
Leukocytes,Ua: NEGATIVE
Nitrite: NEGATIVE
Protein, ur: NEGATIVE mg/dL
Specific Gravity, Urine: 1.015 (ref 1.005–1.030)
Squamous Epithelial / HPF: NONE SEEN (ref 0–5)
pH: 6 (ref 5.0–8.0)

## 2022-07-05 LAB — HEPATIC FUNCTION PANEL
ALT: 12 U/L (ref 0–44)
AST: 27 U/L (ref 15–41)
Albumin: 3.3 g/dL — ABNORMAL LOW (ref 3.5–5.0)
Alkaline Phosphatase: 71 U/L (ref 38–126)
Bilirubin, Direct: 0.3 mg/dL — ABNORMAL HIGH (ref 0.0–0.2)
Indirect Bilirubin: 0.6 mg/dL (ref 0.3–0.9)
Total Bilirubin: 0.9 mg/dL (ref 0.3–1.2)
Total Protein: 8.1 g/dL (ref 6.5–8.1)

## 2022-07-05 LAB — BASIC METABOLIC PANEL
Anion gap: 7 (ref 5–15)
BUN: 42 mg/dL — ABNORMAL HIGH (ref 8–23)
CO2: 26 mmol/L (ref 22–32)
Calcium: 8.8 mg/dL — ABNORMAL LOW (ref 8.9–10.3)
Chloride: 108 mmol/L (ref 98–111)
Creatinine, Ser: 2.74 mg/dL — ABNORMAL HIGH (ref 0.61–1.24)
GFR, Estimated: 25 mL/min — ABNORMAL LOW (ref >60)
Glucose, Bld: 166 mg/dL — ABNORMAL HIGH (ref 70–99)
Potassium: 4.1 mmol/L (ref 3.5–5.1)
Sodium: 141 mmol/L (ref 135–145)

## 2022-07-05 LAB — BLOOD GAS, VENOUS
Acid-Base Excess: 6.7 mmol/L — ABNORMAL HIGH (ref 0.0–2.0)
Bicarbonate: 30.4 mmol/L — ABNORMAL HIGH (ref 20.0–28.0)
O2 Saturation: 89.6 %
Patient temperature: 37
pCO2, Ven: 39 mmHg — ABNORMAL LOW (ref 44–60)
pH, Ven: 7.5 — ABNORMAL HIGH (ref 7.25–7.43)
pO2, Ven: 56 mmHg — ABNORMAL HIGH (ref 32–45)

## 2022-07-05 LAB — CBC
HCT: 34.8 % — ABNORMAL LOW (ref 39.0–52.0)
Hemoglobin: 10.4 g/dL — ABNORMAL LOW (ref 13.0–17.0)
MCH: 30.9 pg (ref 26.0–34.0)
MCHC: 29.9 g/dL — ABNORMAL LOW (ref 30.0–36.0)
MCV: 103.3 fL — ABNORMAL HIGH (ref 80.0–100.0)
Platelets: 115 10*3/uL — ABNORMAL LOW (ref 150–400)
RBC: 3.37 MIL/uL — ABNORMAL LOW (ref 4.22–5.81)
RDW: 16 % — ABNORMAL HIGH (ref 11.5–15.5)
WBC: 6.2 10*3/uL (ref 4.0–10.5)
nRBC: 0 % (ref 0.0–0.2)

## 2022-07-05 LAB — TROPONIN I (HIGH SENSITIVITY)
Troponin I (High Sensitivity): 6 ng/L (ref <18)
Troponin I (High Sensitivity): 6 ng/L (ref <18)

## 2022-07-05 LAB — CBG MONITORING, ED
Glucose-Capillary: 147 mg/dL — ABNORMAL HIGH (ref 70–99)
Glucose-Capillary: 170 mg/dL — ABNORMAL HIGH (ref 70–99)
Glucose-Capillary: 143 mg/dL — ABNORMAL HIGH (ref 70–99)

## 2022-07-05 LAB — SARS CORONAVIRUS 2 BY RT PCR: SARS Coronavirus 2 by RT PCR: NEGATIVE

## 2022-07-05 LAB — TECHNOLOGIST SMEAR REVIEW: Plt Morphology: NORMAL

## 2022-07-05 LAB — VITAMIN B12: Vitamin B-12: 574 pg/mL (ref 180–914)

## 2022-07-05 LAB — LACTATE DEHYDROGENASE: LDH: 128 U/L (ref 98–192)

## 2022-07-05 LAB — C-REACTIVE PROTEIN: CRP: 20.8 mg/dL — ABNORMAL HIGH (ref <1.0)

## 2022-07-05 LAB — GLUCOSE, CAPILLARY: Glucose-Capillary: 142 mg/dL — ABNORMAL HIGH (ref 70–99)

## 2022-07-05 LAB — PROCALCITONIN: Procalcitonin: 0.23 ng/mL

## 2022-07-05 LAB — SEDIMENTATION RATE: Sed Rate: 111 mm/hr — ABNORMAL HIGH (ref 0–20)

## 2022-07-05 LAB — HIV ANTIBODY (ROUTINE TESTING W REFLEX): HIV Screen 4th Generation wRfx: NONREACTIVE

## 2022-07-05 LAB — CK: Total CK: 47 U/L — ABNORMAL LOW (ref 49–397)

## 2022-07-05 MED ORDER — DIVALPROEX SODIUM ER 250 MG PO TB24
1000.0000 mg | ORAL_TABLET | Freq: Two times a day (BID) | ORAL | Status: DC
  Filled 2022-07-05: qty 4

## 2022-07-05 MED ORDER — ROFLUMILAST 500 MCG PO TABS
250.0000 ug | ORAL_TABLET | Freq: Every day | ORAL | Status: DC
  Administered 2022-07-05 – 2022-07-09 (×5): 250 ug via ORAL
  Filled 2022-07-05 (×5): qty 1

## 2022-07-05 MED ORDER — HYDROMORPHONE HCL 1 MG/ML IJ SOLN
2.0000 mg | Freq: Once | INTRAMUSCULAR | Status: AC
  Administered 2022-07-05: 2 mg via INTRAVENOUS
  Filled 2022-07-05: qty 2

## 2022-07-05 MED ORDER — LIDOCAINE 5 % EX PTCH
1.0000 | MEDICATED_PATCH | CUTANEOUS | Status: DC
  Administered 2022-07-05 – 2022-07-10 (×6): 1 via TRANSDERMAL
  Filled 2022-07-05 (×6): qty 1

## 2022-07-05 MED ORDER — DULOXETINE HCL 30 MG PO CPEP
30.0000 mg | ORAL_CAPSULE | Freq: Every day | ORAL | Status: DC
  Administered 2022-07-06: 30 mg via ORAL
  Filled 2022-07-05 (×2): qty 1

## 2022-07-05 MED ORDER — CYCLOBENZAPRINE HCL 10 MG PO TABS
10.0000 mg | ORAL_TABLET | Freq: Three times a day (TID) | ORAL | Status: DC | PRN
  Administered 2022-07-06 – 2022-07-07 (×2): 10 mg via ORAL
  Filled 2022-07-05 (×2): qty 1

## 2022-07-05 MED ORDER — NICOTINE 21 MG/24HR TD PT24
21.0000 mg | MEDICATED_PATCH | Freq: Every day | TRANSDERMAL | Status: DC
  Administered 2022-07-05 – 2022-07-10 (×6): 21 mg via TRANSDERMAL
  Filled 2022-07-05 (×6): qty 1

## 2022-07-05 MED ORDER — LORAZEPAM 2 MG/ML IJ SOLN: 1.0000 mg | INTRAMUSCULAR | Status: DC | PRN

## 2022-07-05 MED ORDER — HYDROCODONE-ACETAMINOPHEN 5-325 MG PO TABS: 1.0000 | ORAL_TABLET | ORAL | Status: DC | PRN

## 2022-07-05 MED ORDER — MORPHINE SULFATE (PF) 2 MG/ML IV SOLN: 2.0000 mg | INTRAVENOUS | Status: DC | PRN

## 2022-07-05 MED ORDER — ACETAMINOPHEN 325 MG PO TABS
650.0000 mg | ORAL_TABLET | Freq: Four times a day (QID) | ORAL | Status: DC | PRN
  Administered 2022-07-06 – 2022-07-08 (×3): 650 mg via ORAL
  Filled 2022-07-05 (×3): qty 2

## 2022-07-05 MED ORDER — OXYCODONE HCL 5 MG PO TABS: 10.0000 mg | ORAL_TABLET | Freq: Four times a day (QID) | ORAL | Status: DC | PRN

## 2022-07-05 MED ORDER — OXYCODONE HCL 5 MG PO TABS
10.0000 mg | ORAL_TABLET | Freq: Three times a day (TID) | ORAL | Status: DC | PRN
  Administered 2022-07-05: 10 mg via ORAL
  Filled 2022-07-05: qty 2

## 2022-07-05 MED ORDER — LACTATED RINGERS IV BOLUS
1000.0000 mL | Freq: Once | INTRAVENOUS | Status: AC
  Administered 2022-07-05: 1000 mL via INTRAVENOUS

## 2022-07-05 MED ORDER — IPRATROPIUM-ALBUTEROL 0.5-2.5 (3) MG/3ML IN SOLN
3.0000 mL | Freq: Four times a day (QID) | RESPIRATORY_TRACT | Status: DC
  Administered 2022-07-05 – 2022-07-06 (×3): 3 mL via RESPIRATORY_TRACT
  Filled 2022-07-05 (×3): qty 3

## 2022-07-05 MED ORDER — VALPROATE SODIUM 100 MG/ML IV SOLN
1000.0000 mg | Freq: Two times a day (BID) | INTRAVENOUS | Status: DC
  Administered 2022-07-05 – 2022-07-10 (×11): 1000 mg via INTRAVENOUS
  Filled 2022-07-05 (×12): qty 10

## 2022-07-05 MED ORDER — KETOTIFEN FUMARATE 0.025 % OP SOLN
1.0000 [drp] | Freq: Two times a day (BID) | OPHTHALMIC | Status: DC
  Administered 2022-07-05 – 2022-07-10 (×10): 1 [drp] via OPHTHALMIC
  Filled 2022-07-05: qty 5

## 2022-07-05 MED ORDER — DM-GUAIFENESIN ER 30-600 MG PO TB12: 1.0000 | ORAL_TABLET | Freq: Two times a day (BID) | ORAL | Status: DC | PRN

## 2022-07-05 MED ORDER — TIZANIDINE HCL 4 MG PO TABS
4.0000 mg | ORAL_TABLET | Freq: Three times a day (TID) | ORAL | Status: DC
  Administered 2022-07-05 – 2022-07-06 (×2): 4 mg via ORAL
  Filled 2022-07-05 (×2): qty 1
  Filled 2022-07-05: qty 2

## 2022-07-05 MED ORDER — INSULIN ASPART 100 UNIT/ML IJ SOLN
0.0000 [IU] | Freq: Three times a day (TID) | INTRAMUSCULAR | Status: DC
  Administered 2022-07-05: 1 [IU] via SUBCUTANEOUS
  Administered 2022-07-06: 2 [IU] via SUBCUTANEOUS
  Administered 2022-07-06: 3 [IU] via SUBCUTANEOUS
  Administered 2022-07-06 – 2022-07-07 (×3): 2 [IU] via SUBCUTANEOUS
  Administered 2022-07-07: 1 [IU] via SUBCUTANEOUS
  Administered 2022-07-10: 2 [IU] via SUBCUTANEOUS
  Administered 2022-07-10: 1 [IU] via SUBCUTANEOUS
  Filled 2022-07-05 (×10): qty 1

## 2022-07-05 MED ORDER — ROSUVASTATIN CALCIUM 10 MG PO TABS
20.0000 mg | ORAL_TABLET | Freq: Every day | ORAL | Status: DC
  Administered 2022-07-05 – 2022-07-09 (×5): 20 mg via ORAL
  Filled 2022-07-05 (×4): qty 2

## 2022-07-05 MED ORDER — SODIUM CHLORIDE 0.9 % IV SOLN
2.0000 g | INTRAVENOUS | Status: DC
  Administered 2022-07-06 – 2022-07-07 (×2): 2 g via INTRAVENOUS
  Filled 2022-07-05: qty 20
  Filled 2022-07-05: qty 2
  Filled 2022-07-05: qty 20

## 2022-07-05 MED ORDER — PANTOPRAZOLE SODIUM 40 MG PO TBEC
40.0000 mg | DELAYED_RELEASE_TABLET | Freq: Every day | ORAL | Status: DC
  Administered 2022-07-06 – 2022-07-10 (×5): 40 mg via ORAL
  Filled 2022-07-05 (×6): qty 1

## 2022-07-05 MED ORDER — ENOXAPARIN SODIUM 30 MG/0.3ML IJ SOSY
30.0000 mg | PREFILLED_SYRINGE | INTRAMUSCULAR | Status: DC
  Administered 2022-07-05 – 2022-07-06 (×2): 30 mg via SUBCUTANEOUS
  Filled 2022-07-05 (×2): qty 0.3

## 2022-07-05 MED ORDER — ALBUTEROL SULFATE (2.5 MG/3ML) 0.083% IN NEBU: 3.0000 mL | INHALATION_SOLUTION | RESPIRATORY_TRACT | Status: DC | PRN

## 2022-07-05 MED ORDER — FLUTICASONE FUROATE-VILANTEROL 100-25 MCG/ACT IN AEPB
1.0000 | INHALATION_SPRAY | Freq: Every day | RESPIRATORY_TRACT | Status: DC
  Administered 2022-07-06 – 2022-07-10 (×5): 1 via RESPIRATORY_TRACT
  Filled 2022-07-05: qty 28

## 2022-07-05 MED ORDER — LOTEPREDNOL ETABONATE 0.5 % OP SUSP
1.0000 [drp] | Freq: Four times a day (QID) | OPHTHALMIC | Status: DC
  Administered 2022-07-06 – 2022-07-10 (×18): 1 [drp] via OPHTHALMIC
  Filled 2022-07-05: qty 5

## 2022-07-05 MED ORDER — LISINOPRIL-HYDROCHLOROTHIAZIDE 20-25 MG PO TABS: 1.0000 | ORAL_TABLET | Freq: Every day | ORAL | Status: DC

## 2022-07-05 MED ORDER — SODIUM CHLORIDE 0.9 % IV SOLN: INTRAVENOUS | Status: DC

## 2022-07-05 MED ORDER — UMECLIDINIUM BROMIDE 62.5 MCG/ACT IN AEPB
1.0000 | INHALATION_SPRAY | Freq: Every day | RESPIRATORY_TRACT | Status: DC
  Administered 2022-07-06 – 2022-07-10 (×5): 1 via RESPIRATORY_TRACT
  Filled 2022-07-05: qty 7

## 2022-07-05 MED ORDER — OXYCODONE HCL 5 MG PO TABS
20.0000 mg | ORAL_TABLET | Freq: Once | ORAL | Status: AC
  Administered 2022-07-05: 20 mg via ORAL
  Filled 2022-07-05: qty 4

## 2022-07-05 MED ORDER — LOTEPREDNOL-TOBRAMYCIN 0.5-0.3 % OP SUSP: 1.0000 [drp] | Freq: Four times a day (QID) | OPHTHALMIC | Status: DC

## 2022-07-05 MED ORDER — TOBRAMYCIN 0.3 % OP SOLN
1.0000 [drp] | Freq: Four times a day (QID) | OPHTHALMIC | Status: DC
  Administered 2022-07-06 – 2022-07-10 (×18): 1 [drp] via OPHTHALMIC
  Filled 2022-07-05: qty 5

## 2022-07-05 MED ORDER — GABAPENTIN 100 MG PO CAPS
100.0000 mg | ORAL_CAPSULE | Freq: Two times a day (BID) | ORAL | Status: DC
  Administered 2022-07-05 – 2022-07-06 (×3): 100 mg via ORAL
  Filled 2022-07-05 (×5): qty 1

## 2022-07-05 MED ORDER — HYDROCHLOROTHIAZIDE 25 MG PO TABS
25.0000 mg | ORAL_TABLET | Freq: Every day | ORAL | Status: DC
  Administered 2022-07-06: 25 mg via ORAL
  Filled 2022-07-05 (×2): qty 1

## 2022-07-05 MED ORDER — OXYCODONE HCL 5 MG PO TABS
5.0000 mg | ORAL_TABLET | Freq: Once | ORAL | Status: AC
  Administered 2022-07-05: 5 mg via ORAL
  Filled 2022-07-05: qty 1

## 2022-07-05 MED ORDER — SODIUM CHLORIDE 0.9 % IV SOLN
2.0000 g | INTRAVENOUS | Status: DC
  Administered 2022-07-05: 2 g via INTRAVENOUS
  Filled 2022-07-05: qty 20

## 2022-07-05 MED ORDER — LISINOPRIL 20 MG PO TABS
20.0000 mg | ORAL_TABLET | Freq: Every day | ORAL | Status: DC
  Administered 2022-07-06: 20 mg via ORAL
  Filled 2022-07-05: qty 2
  Filled 2022-07-05: qty 1

## 2022-07-05 MED ORDER — ONDANSETRON HCL 4 MG/2ML IJ SOLN: 4.0000 mg | Freq: Three times a day (TID) | INTRAMUSCULAR | Status: AC | PRN

## 2022-07-05 MED ORDER — GABAPENTIN 600 MG PO TABS: 300.0000 mg | ORAL_TABLET | Freq: Every day | ORAL | Status: DC

## 2022-07-05 MED ORDER — HYDRALAZINE HCL 20 MG/ML IJ SOLN: 5.0000 mg | INTRAMUSCULAR | Status: DC | PRN

## 2022-07-05 MED ORDER — INSULIN ASPART 100 UNIT/ML IJ SOLN
0.0000 [IU] | Freq: Every day | INTRAMUSCULAR | Status: DC
  Administered 2022-07-09: 2 [IU] via SUBCUTANEOUS

## 2022-07-05 MED ORDER — FENOFIBRATE 160 MG PO TABS
160.0000 mg | ORAL_TABLET | Freq: Every day | ORAL | Status: DC
  Administered 2022-07-06 – 2022-07-09 (×4): 160 mg via ORAL
  Filled 2022-07-05 (×7): qty 1

## 2022-07-05 MED ORDER — ACETAMINOPHEN 500 MG PO TABS
1000.0000 mg | ORAL_TABLET | Freq: Once | ORAL | Status: AC
  Administered 2022-07-05: 1000 mg via ORAL
  Filled 2022-07-05: qty 2

## 2022-07-05 MED ORDER — ACETAMINOPHEN 650 MG RE SUPP: 650.0000 mg | Freq: Four times a day (QID) | RECTAL | Status: DC | PRN

## 2022-07-05 MED ORDER — FERROUS SULFATE 325 (65 FE) MG PO TABS
325.0000 mg | ORAL_TABLET | Freq: Every day | ORAL | Status: DC
  Administered 2022-07-06 – 2022-07-10 (×5): 325 mg via ORAL
  Filled 2022-07-05 (×5): qty 1

## 2022-07-05 NOTE — ED Notes (Signed)
Provider Mora Bellman. replied to secure chat stating received message from this RN and will address momentarily.

## 2022-07-05 NOTE — Assessment & Plan Note (Signed)
-   Start Neurontin 100 mg twice daily -Check vitamin B12 and vitamin B1 levels

## 2022-07-05 NOTE — ED Notes (Signed)
OT working with pt. Pt remains on external cath/male purewick.

## 2022-07-05 NOTE — ED Notes (Signed)
Pt asleep upon this RN's entrance to room. Pt easily woken with voice.

## 2022-07-05 NOTE — Evaluation (Signed)
Occupational Therapy Evaluation Patient Details Name: Albert Figueroa MRN: [id number] DOB: 1956/08/24 Today's Date: 07/05/2022   History of Present Illness Patient is a 66 year old male who presents s/p fall on Tuesday causing him to [facility] on his R hand. Since then has had diffuse and worsening pain in body. 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 Per patient's daughter patient has had 3 falls in past 5 days with worsening cognition in past five days.   Clinical Impression   Chart reviewed, pt greeted in room with son/sons friend present. Pt is alert to self, place, grossly oriented to situation. Pt requires increased time for all one step direction following, poor processing, is not at his baseline cognitively. Pt presents with deficits in activity tolerance, cognition, balance, all affecting safe and optimal ADL completion. MOD A required for bed mobility, STS with MOD A, pt unable to maintain static standing for more than 15 seconds. Pt able to take 2 steps up the bed to the R with MOD A. Pt with edema throughout R wrist noted after removal of brace, MD present to assess. Pt also with c/o pain throughout R shoulder (pt son endorses pt may have previous rotator cuff surgery but further details not provided). At this time recommend discharge to STR to address deficits, OT will continue to follow acutely.    Recommendations for follow up therapy are one component of a multi-disciplinary discharge planning process, led by the attending physician.  Recommendations may be updated based on patient status, additional functional criteria and insurance authorization.   Follow Up Recommendations  Skilled nursing-short term rehab (<3 hours/day)    Assistance Recommended at Discharge Frequent or constant Supervision/Assistance  Patient can return home with the following A lot of help with walking and/or  transfers;A lot of help with bathing/dressing/bathroom    Functional Status Assessment  Patient has had a recent decline in their functional status and demonstrates the ability to make significant improvements in function in a reasonable and predictable amount of time.  Equipment Recommendations  Other (comment) (per next venue of care)    Recommendations for Other Services       Precautions / Restrictions Precautions Precautions: Fall Required Braces or Orthoses: Other Brace Other Brace: R wrist brace Restrictions Weight Bearing Restrictions: No      Mobility Bed Mobility Overal bed mobility: Needs Assistance Bed Mobility: Supine to Sit, Sit to Supine     Supine to sit: Mod assist Sit to supine: Mod assist   General bed mobility comments: reposition in bed with MAX A +2    Transfers Overall transfer level: Needs assistance   Transfers: Sit to/from Stand Sit to Stand: Mod assist           General transfer comment: pt able to maintain static standing for no more than 15 seconds      Balance Overall balance assessment: Needs assistance, History of Falls Sitting-balance support: Feet supported Sitting balance-Leahy Scale: Poor Sitting balance - Comments: CGA-MIN A required at all times for static sitting balance   Standing balance support: No upper extremity supported Standing balance-Leahy Scale: Poor                             ADL either performed or assessed with clinical judgement   ADL Overall ADL's : Needs assistance/impaired Eating/Feeding: NPO   Grooming: Wash/dry face;Moderate assistance  Upper Body Dressing : Cueing for sequencing;Maximal assistance   Lower Body Dressing: Maximal assistance;Cueing for sequencing       Toileting- Clothing Manipulation and Hygiene: Maximal assistance               Vision Patient Visual Report: No change from baseline Additional Comments: will continue to assess      Perception     Praxis      Pertinent Vitals/Pain Pain Assessment Pain Assessment: Faces Faces Pain Scale: Hurts whole lot Pain Location: R writst Pain Descriptors / Indicators: Grimacing, Guarding Pain Intervention(s): Repositioned, Monitored during session     Hand Dominance     Extremity/Trunk Assessment Upper Extremity Assessment Upper Extremity Assessment: Difficult to assess due to impaired cognition (LUE appears WFL however diffiucult to assess due to current cognition; significant pain with RUE movement- in and out of splint. Pt reports discomfort in R wrist and R shoulder. MD in room to assess.)   Lower Extremity Assessment Lower Extremity Assessment: Difficult to assess due to impaired cognition (pt is able to raise legs off bed, unable to participate in quad set due to cognition seated at edge of bed)   Cervical / Trunk Assessment Cervical / Trunk Assessment: Normal   Communication     Cognition Arousal/Alertness: Awake/alert Behavior During Therapy: Restless Overall Cognitive Status: Impaired/Different from baseline Area of Impairment: Orientation, Attention, Memory, Following commands, Safety/judgement, Problem solving, Awareness                 Orientation Level: Disoriented to, Time Current Attention Level: Focused Memory: Decreased recall of precautions, Decreased short-term memory Following Commands: Follows one step commands with increased time Safety/Judgement: Decreased awareness of safety, Decreased awareness of deficits Awareness: Intellectual Problem Solving: Requires verbal cues, Requires tactile cues, Decreased initiation       General Comments  edema throughout R wrist    Exercises     Shoulder Instructions      Home Living Family/patient expects to be discharged to:: Private residence Living Arrangements: Alone Available Help at Discharge: Family;Available PRN/intermittently Type of Home: House Home Access: Stairs to  enter [employer] of Steps: 3-4 Entrance Stairs-Rails: Right;Left Home Layout: One level     Bathroom Shower/Tub: Teacher, early years/pre: Standard Bathroom Accessibility: No   Home Equipment: Cane - single point   Additional Comments: son endorses plof at bedside, home set up recieved from chart      Prior Functioning/Environment Prior Level of Function : Independent/Modified Independent             Mobility Comments: pt amb without AD, PRN use of cane- signficant instability and weakness over the past 5 days with 3 falls ADLs Comments: typically indep in ADL/IADL, cooks, cleans, drives, works with son        OT Problem List: Decreased strength;Decreased activity tolerance;Impaired balance (sitting and/or standing);Decreased safety awareness      OT Treatment/Interventions: Self-care/ADL training;Therapeutic exercise;Therapeutic activities;DME and/or AE instruction;Patient/family education    OT Goals(Current goals can be found in the care plan section) Acute Rehab OT Goals Patient Stated Goal: return to  [facility] OT Goal Formulation: With patient/family Time For Goal Achievement: 07/19/22 Potential to Achieve Goals: Good ADL Goals Pt Will Perform Grooming: with modified independence;standing Pt Will Perform Lower Body Dressing: with modified independence;sit to/from stand Pt Will Transfer to Toilet: with modified independence;ambulating Pt Will Perform Toileting - Clothing Manipulation and hygiene: with modified independence;sit to/from stand  OT Frequency: Min 2X/week    Co-evaluation  AM-PAC OT "6 Clicks" Daily Activity     Outcome Measure Help from another person eating meals?: Total Help from another person taking care of personal grooming?: A Lot Help from another person toileting, which includes using toliet, bedpan, or urinal?: A Lot Help from another person bathing (including washing, rinsing, drying)?: A Lot Help from  another person to put on and taking off regular upper body clothing?: A Lot Help from another person to put on and taking off regular lower body clothing?: A Lot 6 Click Score: 11   End of Session Equipment Utilized During Treatment: Gait belt Nurse Communication: Mobility status  Activity Tolerance: Patient tolerated treatment well Patient left: in bed;with call bell/phone within reach;with bed alarm set;with family/visitor present  OT Visit Diagnosis: Unsteadiness on feet (R26.81);Muscle weakness (generalized) (M62.81)                Time: 0981-1914 OT Time Calculation (min): 20 min Charges:  OT General Charges $OT Visit: 1 Visit OT Evaluation $OT Eval Low Complexity: 1 Low  Shanon Payor, OTD OTR/L  07/05/22, 3:11 PM

## 2022-07-05 NOTE — ED Notes (Signed)
Patient transported to CT 

## 2022-07-05 NOTE — Assessment & Plan Note (Addendum)
Patient has pain all over, but was down right wrist in the right hand.  X-ray negative for right wrist, right hand and right shoulder.  His right hand seems to have cellulitis.  Patient received multiple dose of pain medication, he becomes lethargic.  -Placed on MedSurg bed for patient -Will discontinue IV narcotic -Decrease oxycodone to 10 mg as needed every 8 hours -As needed Tylenol -Continue home as needed Flexeril for muscle spasm -Right wrist splint

## 2022-07-05 NOTE — ED Notes (Signed)
Wrist brace applied to right hand/wrist.

## 2022-07-05 NOTE — ED Notes (Signed)
Will complete orthostatic vital signs once pt stable enough to do so; previous RN confirms pt has been lethargic since arrival; states pt takes heavy doses of pain meds for chronic pain at home.

## 2022-07-05 NOTE — Assessment & Plan Note (Signed)
Recent A1c 6.7, well controlled.  Patient is taking Ozempic, Humalog, Farxiga -Sliding scale insulin

## 2022-07-05 NOTE — Assessment & Plan Note (Signed)
-  Nicotine patch 

## 2022-07-05 NOTE — ED Notes (Signed)
Imaging staff at bedside.  

## 2022-07-05 NOTE — ED Triage Notes (Addendum)
PT arrived via [facility] from home, fall 2 days ago c/o R arm pain, reports hitting head with + LOC, reports on blood thinner, c/o weakness.  Pt also has swelling to bilateral feet, hx of neuropathy  106/58 P78 100% RA 99.1 temp  Pt states he fell outside due to tripping, pt states he hit his head on the ground also c/o neck pain.  Pt states his "feet don't work like they are supposed to"  Pt reports he takes a blood thinner but does not know the name, no blood thinner noted on med list.   PT has hx of VP shunt

## 2022-07-05 NOTE — Assessment & Plan Note (Addendum)
Imaging negative for acute injury. - Fall precaution -PT/OT

## 2022-07-05 NOTE — NC FL2 (Signed)
[place] LEVEL OF CARE SCREENING TOOL     IDENTIFICATION  Patient Name: Albert Figueroa Birthdate: 1955-10-18 Sex: male Admission Date (Current Location): 07/05/2022  [place] and [place] Number:  Engineering geologist and Address:  [facility], [address] [place], [place] [address]      Provider Number: [id number]  Attending Physician Name and Address:  Ivor Costa, MD  Relative Name and Phone Number:  Weyman Croon (Daughter)   [phone] (Mobile)    Current Level of Care: Hospital Recommended Level of Care: [place] Prior Approval Number:    Date Approved/Denied:   PASRR Number: [id number]  Discharge Plan:      Current Diagnoses: Patient Active Problem List   Diagnosis Date Noted   Intractable pain 07/05/2022   Type II diabetes mellitus with renal manifestations ([place]) 07/05/2022   Over weight 07/05/2022   Thrombocytopenia ([place]) 07/05/2022   Fall at home, initial encounter 07/05/2022   Fever 07/05/2022   COPD (chronic obstructive pulmonary disease) ([place]) 07/05/2022   Iron deficiency anemia 40/81/4481   Acute metabolic encephalopathy 85/63/1497   Neuropathy 07/05/2022   Right wrist pain    Chronic pain syndrome 01/01/2021   Pharmacologic therapy 01/01/2021   Disorder of skeletal system 01/01/2021   Problems influencing health status 01/01/2021   Macrocytic anemia 11/20/2020   Aortic atherosclerosis ([place]) 02/24/2020   Other emphysema ([place]) 02/24/2020   Medicare annual wellness visit, initial 02/03/2020   Neck pain 09/15/2018   Sacroiliac joint pain 09/08/2018   Lumbar spondylosis 06/11/2018   Left hip pain 06/11/2018   Chronic pain of both knees 06/11/2018   CKD (chronic kidney disease) stage 4, GFR 15-29 ml/min (HCC) 10/29/2017   Obesity (BMI 30-39.9) 11/27/2015   Type 2 diabetes mellitus with chronic kidney disease, with long-term current use of insulin (HCC) 06/13/2015   Tobacco use 06/13/2015    Essential hypertension 06/13/2015   Mixed hyperlipidemia 06/13/2015   Osteoarthritis 06/13/2015   Biceps tendinitis 04/06/2015   Epilepsy without status epilepticus, not intractable ([place]) 12/10/2013    Orientation RESPIRATION BLADDER Height & Weight        Normal External catheter, Incontinent Weight: 180 lb (81.6 kg) Height:  5\' 11"  (180.3 cm)  BEHAVIORAL SYMPTOMS/MOOD NEUROLOGICAL BOWEL NUTRITION STATUS      Incontinent Diet  AMBULATORY STATUS COMMUNICATION OF NEEDS Skin   Extensive Assist Verbally                         Personal Care Assistance Level of Assistance  Bathing, Feeding, Dressing Bathing Assistance: Maximum assistance Feeding assistance: Maximum assistance Dressing Assistance: Maximum assistance     Functional Limitations Info             SPECIAL CARE FACTORS FREQUENCY  PT (By licensed PT), OT (By licensed OT)     PT Frequency: 5 times per week OT Frequency: 5 times per week            Contractures      Additional Factors Info  Code Status, Allergies Code Status Info: full Allergies Info: Gabapentin, Amlodipine           Current Medications (07/05/2022):  This is the current hospital active medication list Current Facility-Administered Medications  Medication Dose Route Frequency Provider Last Rate Last Admin   acetaminophen (TYLENOL) tablet 650 mg  650 mg Oral Q6H PRN Athena Masse, MD       Or   acetaminophen (TYLENOL) suppository 650  mg  650 mg Rectal Q6H PRN Athena Masse, MD       albuterol (PROVENTIL) (2.5 MG/3ML) 0.083% nebulizer solution 3 mL  3 mL Inhalation Q4H PRN Ivor Costa, MD       cyclobenzaprine (FLEXERIL) tablet 10 mg  10 mg Oral TID PRN Ivor Costa, MD       dextromethorphan-guaiFENesin ([place] DM) 30-600 MG per 12 hr tablet 1 tablet  1 tablet Oral BID PRN Ivor Costa, MD       DULoxetine (CYMBALTA) DR capsule 30 mg  30 mg Oral Daily Ivor Costa, MD       enoxaparin (LOVENOX) injection 30 mg  30 mg Subcutaneous  Q24H Ivor Costa, MD   30 mg at 07/05/22 2094   fenofibrate tablet 160 mg  160 mg Oral Daily Ivor Costa, MD       Derrill Memo ON 07/06/2022] ferrous sulfate tablet 325 mg  325 mg Oral Q breakfast Ivor Costa, MD       Derrill Memo ON 07/06/2022] fluticasone furoate-vilanterol (BREO ELLIPTA) 100-25 MCG/ACT 1 puff  1 puff Inhalation Daily Ivor Costa, MD       And   [START ON 07/06/2022] umeclidinium bromide (INCRUSE ELLIPTA) 62.5 MCG/ACT 1 puff  1 puff Inhalation Daily Ivor Costa, MD       gabapentin (NEURONTIN) capsule 100 mg  100 mg Oral BID Ivor Costa, MD       hydrALAZINE (APRESOLINE) injection 5 mg  5 mg Intravenous Q2H PRN Ivor Costa, MD       lisinopril (ZESTRIL) tablet 20 mg  20 mg Oral Daily Ivor Costa, MD       And   hydrochlorothiazide (HYDRODIURIL) tablet 25 mg  25 mg Oral Daily Ivor Costa, MD       insulin aspart (novoLOG) injection 0-5 Units  0-5 Units Subcutaneous QHS Ivor Costa, MD       insulin aspart (novoLOG) injection 0-9 Units  0-9 Units Subcutaneous TID WC Ivor Costa, MD       ipratropium-albuterol (DUONEB) 0.5-2.5 (3) MG/3ML nebulizer solution 3 mL  3 mL Inhalation QID Ivor Costa, MD       ketotifen (ZADITOR) 0.025 % ophthalmic solution 1 drop  1 drop Both Eyes BID Ivor Costa, MD       lidocaine (LIDODERM) 5 % 1 patch  1 patch Transdermal Q24H Ivor Costa, MD   1 patch at 07/05/22 0919   [START ON 07/06/2022] loteprednol (LOTEMAX) 0.5 % ophthalmic suspension 1 drop  1 drop Both Eyes QID Ivor Costa, MD       And   [START ON 07/06/2022] tobramycin (TOBREX) 0.3 % ophthalmic solution 1 drop  1 drop Both Eyes QID Ivor Costa, MD       nicotine (NICODERM CQ - dosed in mg/24 hours) patch 21 mg  21 mg Transdermal Daily Ivor Costa, MD   21 mg at 07/05/22 0919   ondansetron (ZOFRAN) injection 4 mg  4 mg Intravenous Q8H PRN Athena Masse, MD       oxyCODONE (Oxy IR/ROXICODONE) immediate release tablet 10 mg  10 mg Oral Q8H PRN Ivor Costa, MD       pantoprazole (PROTONIX) EC tablet 40 mg  40 mg Oral  Daily Ivor Costa, MD       roflumilast (DALIRESP) tablet 250 mcg  250 mcg Oral QHS Ivor Costa, MD       rosuvastatin (CRESTOR) tablet 20 mg  20 mg Oral QHS Ivor Costa, MD  tiZANidine (ZANAFLEX) tablet 4 mg  4 mg Oral TID Ivor Costa, MD       valproate (DEPACON) 1,000 mg in dextrose 5 % 50 mL IVPB  1,000 mg Intravenous Q12H Ivor Costa, MD       Current Outpatient Medications  Medication Sig Dispense Refill   azelastine (OPTIVAR) 0.05 % ophthalmic solution Place 1 drop into both eyes 2 (two) times daily.     COMBIVENT RESPIMAT 20-100 MCG/ACT AERS respimat Inhale 1 puff into the lungs 4 (four) times daily.     dexlansoprazole (DEXILANT) 60 MG capsule Take 60 mg by mouth daily.     divalproex (DEPAKOTE ER) 500 MG 24 hr tablet Take 1,000 mg by mouth in the morning and at bedtime.     DULoxetine (CYMBALTA) 30 MG capsule Take 30 mg by mouth daily.     fenofibrate 160 MG tablet Take 160 mg by mouth daily.     ferrous sulfate 325 (65 FE) MG tablet Take 325 mg by mouth daily with breakfast.     Loteprednol-Tobramycin (ZYLET) 0.5-0.3 % SUSP Place 1 drop into both eyes 4 (four) times daily.     Oxycodone HCl 20 MG TABS Take 20 mg by mouth See admin instructions. Take 20 mg by mouth every 4-6 hours as needed. Max 5 tablets daily     Roflumilast (DALIRESP) 250 MCG TABS Take 250 mcg by mouth at bedtime.     rosuvastatin (CRESTOR) 20 MG tablet Take 20 mg by mouth at bedtime.     tiZANidine (ZANAFLEX) 4 MG tablet Take 4 mg by mouth 3 (three) times daily.     torsemide (DEMADEX) 20 MG tablet Take 20 mg by mouth daily as needed for edema.     TRELEGY ELLIPTA 100-62.5-25 MCG/ACT AEPB Inhale 1 puff into the lungs daily.     cyclobenzaprine (FLEXERIL) 10 MG tablet Take 10 mg by mouth 3 (three) times daily as needed for muscle spasms.     FARXIGA 10 MG TABS tablet Take 10 mg by mouth daily.     insulin lispro (HUMALOG) 100 UNIT/ML injection Inject into the skin 3 (three) times daily as needed for high blood  sugar. (Patient not taking: Reported on 07/05/2022)     lisinopril-hydrochlorothiazide (ZESTORETIC) 20-25 MG tablet Take 1 tablet by mouth daily.     OZEMPIC, 0.25 OR 0.5 MG/DOSE, 2 MG/3ML SOPN Inject 2 mg into the skin once a week.       Discharge Medications: Please see discharge summary for a list of discharge medications.  Relevant Imaging Results:  Relevant Lab Results:   Additional Information SS #: [id number]  Euriah Matlack E Liddie Chichester, LCSW

## 2022-07-05 NOTE — H&P (Signed)
History and Physical    Albert Figueroa MLJ:[id number] DOB: 1956/06/21 DOA: 07/05/2022  Referring MD/NP/PA:   PCP: Dion Body, MD   Patient coming from:  The patient is coming from home.     Chief Complaint: intractable pain after fall.  HPI: Albert Figueroa is a 66 y.o. 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  Pt states that he fell 2 days ago after he tripped on the steps.  He states that he has neuropathy and chronic gait instability which caused his fall. He denies loss of consciousness to me.  He has history of chronic pain syndrome on oxycodone 20 mg every 4-6 hours daily as needed (maximum 5 tablets).  He states that after fall, his pain has significantly worsened.  He states that he has pain all over, the worst pain is in right wrist and right hand.  The right wrist and hand pain is constant, sharp, severe, intractable, nonradiating.  The right hand is mildly swelling with mild erythema on examination.  Patient also has moderate pain in the right shoulder.  Patient does not have cough, shortness breath, chest pain.  No nausea, vomiting, diarrhea or abdominal pain.  Denies symptoms of UTI.  Patient was given multiple doses of pain medication in the ED, including 2 mg Dilaudid, 20 mg of oxycodone, 5 mg oxycodone again due to uncontrolled pain. When I saw pt in ED, he is very lethargic, but arousable, moves all extremities, no facial droop or slurred speech. Per report, pt had temperature 100.3 earlier, currently body temperature 98 when saw patient in ED.  Data reviewed independently and ED Course: pt was found to have WBC 6.2, troponin level 6 --> 6, negative urinalysis, negative COVID PCR, CK level 47, renal function close to baseline, blood  pressure 123/56, heart rate 89, RR 21, 13, oxygen saturation 96% on room air.  CT of head is negative for acute intracranial abnormalities.  CT of C-spine is negative for bony fracture, but showed degenerative disc disease.  X-ray of her right hand and the right wrist are negative for bony fracture. X-ray of right shoulder negative for fracture. Patient is placed on MedSurg bed for observation.   EKG: I have personally reviewed.  Sinus rhythm, QTc 433, early R wave progression, low voltage.  Review of Systems:   General: no fevers, chills, no body weight gain, has fatigue HEENT: no blurry vision, hearing changes or sore throat Respiratory: no dyspnea, coughing, wheezing CV: no chest pain, no palpitations GI: no nausea, vomiting, abdominal pain, diarrhea, constipation GU: no dysuria, burning on urination, increased urinary frequency, hematuria  Ext: no leg edema Neuro: no unilateral weakness, numbness, or tingling, no vision change or hearing loss. Has lethargy and fall Skin: no rash, no skin tear. MSK: Has pain all over, worse on right wrist  heme: No easy bruising.  Travel history: No recent long distant travel.   Allergy:  Allergies  Allergen Reactions   Gabapentin Other (See Comments)    Unknown reaction (Per Significant Other)   Amlodipine     Other reaction(s): Other (see comments) Caused elevated blood pressure and headaches    Past Medical History:  Diagnosis Date   Arthritis    Diabetes mellitus without complication (HCC)    Emphysema/COPD ([place])    Hyperlipidemia  Hypertension    Kidney disease    stage 3   Lumbar stenosis    Osteoarthrosis    Seizure [facility])     Past Surgical History:  Procedure Laterality Date   KNEE SURGERY     multiple surgeries on both knees    Social History:  reports that he has been smoking cigarettes. He has a 96.00 pack-year smoking history. He has never used smokeless tobacco. He reports that he does not drink alcohol and does not  use drugs.  Family History:  Family History  Problem Relation Age of Onset   Diabetes Mother    Cancer Mother      Prior to Admission medications   Medication Sig Start Date End Date Taking? Authorizing Provider  cyclobenzaprine (FLEXERIL) 10 MG tablet Take 10 mg by mouth 3 (three) times daily as needed for muscle spasms.    [provider]  dexlansoprazole (DEXILANT) 60 MG capsule Take 60 mg by mouth daily.    [provider]  divalproex (DEPAKOTE ER) 500 MG 24 hr tablet Take 500 mg by mouth 2 (two) times daily.    [provider]  Fluticasone-Umeclidin-Vilant (TRELEGY ELLIPTA IN) Inhale into the lungs daily.    [provider]  insulin lispro (HUMALOG) 100 UNIT/ML injection Inject into the skin 3 (three) times daily as needed for high blood sugar.    [provider]  lisinopril-hydrochlorothiazide (ZESTORETIC) 20-25 MG tablet Take 1 tablet by mouth daily. 04/08/22   [provider]  Oxycodone HCl 10 MG TABS Take 10 mg by mouth every 4 (four) hours as needed.    [provider]  Roflumilast (DALIRESP) 250 MCG TABS Take by mouth at bedtime.    [provider]  senna-docusate (SENOKOT-S) 8.6-50 MG tablet Take 1 tablet by mouth 2 (two) times daily.    [provider]    Physical Exam: Vitals:   07/05/22 1600 07/05/22 1609 07/05/22 1611 07/05/22 1641  BP: (!) 152/77   (!) 154/94  Pulse: 88  93 88  Resp: (!) 23 20 (!) 23 17  Temp:    98.9 F (37.2 C)  TempSrc:      SpO2: 100%  99% 99%  Weight:      Height:       General: Not in acute distress HEENT:       Eyes: PERRL, EOMI, no scleral icterus.       ENT: No discharge from the ears and nose.       Neck: No JVD, no bruit, no mass felt. Heme: No neck lymph node enlargement. Cardiac: S1/S2, RRR, No murmurs, No gallops or rubs. Respiratory: No rales, wheezing, rhonchi or rubs. GI: Soft, nondistended, nontender, organomegaly, BS present. GU: No  hematuria Ext: No pitting leg edema bilaterally. 1+DP/PT pulse bilaterally. Musculoskeletal: Patient has diffuse tenderness, worst on the right wrist and right hand. Has mild swelling and mild erythema in dorsal aspect of right hand. Has tenderness in right shoulder. Skin: No rashes.  Neuro: Lethargic, arousable, when aroused patient is oriented x3 , cranial nerves II-XII grossly intact, moves all extremities. Psych: Patient is not psychotic, no suicidal or hemocidal ideation.  Labs on Admission: I have personally reviewed following labs and imaging studies  CBC: Recent Labs  Lab 07/05/22 0216  WBC 6.3  6.2  NEUTROABS 4.1  HGB 10.3*  10.4*  HCT 33.9*  34.8*  MCV 103.4*  103.3*  PLT 118*  854*   Basic Metabolic Panel: Recent Labs  Lab  07/05/22 0216  NA 141  K 4.1  CL 108  CO2 26  GLUCOSE 166*  BUN 42*  CREATININE 2.74*  CALCIUM 8.8*   GFR: Estimated Creatinine Clearance: 28.6 mL/min (A) (by C-G formula based on SCr of 2.74 mg/dL (H)). Liver Function Tests: Recent Labs  Lab 07/05/22 0215  AST 27  ALT 12  ALKPHOS 71  BILITOT 0.9  PROT 8.1  ALBUMIN 3.3*   No results for input(s): "LIPASE", "AMYLASE" in the last 168 hours. No results for input(s): "AMMONIA" in the last 168 hours. Coagulation Profile: No results for input(s): "INR", "PROTIME" in the last 168 hours. Cardiac Enzymes: Recent Labs  Lab 07/05/22 0215  CKTOTAL 47*   BNP (last 3 results) No results for input(s): "PROBNP" in the last 8760 hours. HbA1C: No results for input(s): "HGBA1C" in the last 72 hours. CBG: Recent Labs  Lab 07/05/22 0125 07/05/22 0208 07/05/22 1349 07/05/22 1723  GLUCAP 147* 170* 143* 142*   Lipid Profile: No results for input(s): "CHOL", "HDL", "LDLCALC", "TRIG", "CHOLHDL", "LDLDIRECT" in the last 72 hours. Thyroid Function Tests: No results for input(s): "TSH", "T4TOTAL", "FREET4", "T3FREE", "THYROIDAB" in the last 72 hours. Anemia Panel: No results for  input(s): "VITAMINB12", "FOLATE", "FERRITIN", "TIBC", "IRON", "RETICCTPCT" in the last 72 hours. Urine analysis:    Component Value Date/Time   COLORURINE YELLOW (A) 07/05/2022 0442   APPEARANCEUR CLEAR (A) 07/05/2022 0442   LABSPEC 1.015 07/05/2022 0442   PHURINE 6.0 07/05/2022 0442   GLUCOSEU >=500 (A) 07/05/2022 0442   HGBUR NEGATIVE 07/05/2022 0442   BILIRUBINUR NEGATIVE 07/05/2022 0442   KETONESUR NEGATIVE 07/05/2022 0442   PROTEINUR NEGATIVE 07/05/2022 0442   NITRITE NEGATIVE 07/05/2022 0442   LEUKOCYTESUR NEGATIVE 07/05/2022 0442   Sepsis Labs: '@LABRCNTIP' (procalcitonin:4,lacticidven:4) ) Recent Results (from the past 240 hour(s))  SARS Coronavirus 2 by RT PCR (hospital order, performed in [facility] hospital lab) *cepheid single result test* Anterior Nasal Swab     Status: None   Collection Time: 07/05/22  3:17 AM   Specimen: Anterior Nasal Swab  Result Value Ref Range Status   SARS Coronavirus 2 by RT PCR NEGATIVE NEGATIVE Final    Comment: (NOTE) SARS-CoV-2 target nucleic acids are NOT DETECTED.  The SARS-CoV-2 RNA is generally detectable in upper and lower respiratory specimens during the acute phase of infection. The lowest concentration of SARS-CoV-2 viral copies this assay can detect is 250 copies / mL. A negative result does not preclude SARS-CoV-2 infection and should not be used as the sole basis for treatment or other patient management decisions.  A negative result may occur with improper specimen collection / handling, submission of specimen other than nasopharyngeal swab, presence of viral mutation(s) within the areas targeted by this assay, and inadequate number of viral copies (<250 copies / mL). A negative result must be combined with clinical observations, patient history, and epidemiological information.  Fact Sheet for Patients:   https://www.patel.info/  Fact Sheet for Healthcare  Providers: https://hall.com/  This test is not yet approved or  cleared by the [place] FDA and has been authorized for detection and/or diagnosis of SARS-CoV-2 by FDA under an Emergency Use Authorization (EUA).  This EUA will remain in effect (meaning this test can be used) for the duration of the COVID-19 declaration under Section 564(b)(1) of the Act, 21 U.S.C. section 360bbb-3(b)(1), unless the authorization is terminated or revoked sooner.  Performed at [facility], [address], [place], [place] [address]      Radiological Exams  on Admission: DG Shoulder Right  Result Date: 07/05/2022 CLINICAL DATA:  RIGHT shoulder pain after falling 2 days ago. EXAM: RIGHT SHOULDER - 2+ VIEW COMPARISON:  MRI of the shoulder from 2021 FINDINGS: Humeral head is located. Glenohumeral degenerative changes. Scapula is intact to the extent evaluated. Shunt tubing is incidentally noted crossing the field of view with extensive calcification along its course in the neck and chest wall. Soft tissues are otherwise unremarkable. IMPRESSION: 1. No acute fracture or dislocation. 2. Glenohumeral degenerative changes. 3. Shunt tubing with extensive calcification along its course in the neck and chest wall. Correlate with any signs of shunt malfunction. Electronically Signed   By: Zetta Bills M.D.   On: 07/05/2022 15:02   DG Wrist Complete Right  Result Date: 07/05/2022 CLINICAL DATA:  Recent fall with right wrist pain, initial encounter EXAM: RIGHT WRIST - COMPLETE 3+ VIEW COMPARISON:  None Available. FINDINGS: Changes of prior fracture along the posterior aspect of the distal radius are seen with nonunion. No acute fracture is noted. Widening of the scapholunate space is noted consistent with chronic ligamentous injury. IMPRESSION: Chronic changes without acute abnormality. Electronically Signed   By: Inez Catalina M.D.   On: 07/05/2022 02:45   DG Hand Complete  Right  Result Date: 07/05/2022 CLINICAL DATA:  Right hand pain following fall, initial encounter EXAM: RIGHT HAND - COMPLETE 3+ VIEW COMPARISON:  None Available. 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 No acute fracture is seen. Interphalangeal degenerative changes are noted. No gross soft tissue abnormality is noted. Changes of prior distal radial fracture with nonunion are noted posteriorly. IMPRESSION: Chronic changes without acute abnormality. Electronically Signed   By: Inez Catalina M.D.   On: 07/05/2022 02:44   CT HEAD WO CONTRAST  Result Date: 07/05/2022 CLINICAL DATA:  Fall 2 days ago with headaches and neck pain, initial encounter EXAM: CT HEAD WITHOUT CONTRAST CT CERVICAL SPINE WITHOUT CONTRAST TECHNIQUE: Multidetector CT imaging of the head and cervical spine was performed following the standard protocol without intravenous contrast. Multiplanar CT image reconstructions of the cervical spine were also generated. RADIATION DOSE REDUCTION: This exam was performed according to the departmental dose-optimization program which includes automated exposure control, adjustment of the mA and/or kV according to patient size and/or use of iterative reconstruction technique. COMPARISON:  None Available. FINDINGS: CT HEAD FINDINGS Brain: Mild atrophic changes are noted. No findings to suggest acute hemorrhage, acute infarction or space-occupying mass lesion are noted. A right-sided ventricular shunt catheter is noted extending into the right lateral ventricle. 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 Vascular: No hyperdense vessel or unexpected calcification. Skull: Normal. Negative for fracture or focal lesion. Sinuses/Orbits: No acute finding. Other: None. CT CERVICAL SPINE FINDINGS Alignment: Within normal limits. Skull base and vertebrae: 7 cervical segments are well visualized. Vertebral body height  is well maintained. Multilevel osteophytic changes and facet hypertrophic changes are seen. No acute fracture or acute facet abnormality is noted. Soft tissues and spinal canal: Surrounding soft tissue structures show a right-sided shunt catheter as previously described. No other focal abnormality is noted. Upper chest: Visualized lung apices are within normal limits. Other: None IMPRESSION: CT of the head: Chronic changes without acute abnormality. CT of the cervical spine: Multilevel degenerative change without acute abnormality. Electronically Signed   By: Inez Catalina M.D.   On: 07/05/2022 02:29   CT Cervical Spine Wo Contrast  Result Date: 07/05/2022 CLINICAL DATA:  Fall 2 days ago with headaches and neck pain, initial encounter EXAM: CT HEAD WITHOUT CONTRAST CT CERVICAL SPINE WITHOUT CONTRAST TECHNIQUE: Multidetector CT imaging of the head and cervical spine was performed following the standard protocol without intravenous contrast. Multiplanar CT image reconstructions of the cervical spine were also generated. RADIATION DOSE REDUCTION: This exam was performed according to the departmental dose-optimization program which includes automated exposure control, adjustment of the mA and/or kV according to patient size and/or use of iterative reconstruction technique. COMPARISON:  None Available. FINDINGS: CT HEAD FINDINGS Brain: Mild atrophic changes are noted. No findings to suggest acute hemorrhage, acute infarction or space-occupying mass lesion are noted. A right-sided ventricular shunt catheter is noted extending into the right lateral ventricle. 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 Vascular: No hyperdense vessel or unexpected calcification. Skull: Normal. Negative for fracture or focal lesion. Sinuses/Orbits: No acute finding. Other: None. CT CERVICAL SPINE FINDINGS Alignment: Within normal limits. Skull base and vertebrae: 7 cervical segments are well visualized. Vertebral body height is well  maintained. Multilevel osteophytic changes and facet hypertrophic changes are seen. No acute fracture or acute facet abnormality is noted. Soft tissues and spinal canal: Surrounding soft tissue structures show a right-sided shunt catheter as previously described. No other focal abnormality is noted. Upper chest: Visualized lung apices are within normal limits. Other: None IMPRESSION: CT of the head: Chronic changes without acute abnormality. CT of the cervical spine: Multilevel degenerative change without acute abnormality. Electronically Signed   By: Inez Catalina M.D.   On: 07/05/2022 02:29      Assessment/Plan Principal Problem:   Intractable pain Active Problems:   Chronic pain syndrome   CKD (chronic kidney disease) stage 4, GFR 15-29 ml/min (HCC)   Essential hypertension   Fall at home, initial encounter   Thrombocytopenia ([place])   Tobacco use   Type II diabetes mellitus with renal manifestations (HCC)   Neuropathy   Acute metabolic encephalopathy   Iron deficiency anemia   COPD (chronic obstructive pulmonary disease) (HCC)   Cellulitis of right hand   Over weight   Assessment and Plan: * Intractable pain Patient has pain all over, but was down right wrist in the right hand.  X-ray negative for right wrist, right hand and right shoulder.  His right hand seems to have cellulitis.  Patient received multiple dose of pain medication, he becomes lethargic.  -Placed on MedSurg bed for patient -Will discontinue IV narcotic -Decrease oxycodone to 10 mg as needed every 8 hours -As needed Tylenol -Continue home as needed Flexeril for muscle spasm -Right wrist splint  Chronic pain syndrome - See above  CKD (chronic kidney disease) stage 4, GFR 15-29 ml/min (HCC) Renal function close to baseline.  Recent baseline creatinine 2.5-2.6.  His creatinine is at 2.74, BUN 42. -Follow-up by BMP  Essential hypertension - IV hydralazine as needed -Zestoretic  Fall at home, initial  encounter Imaging negative for acute injury. - Fall precaution -PT/OT  Thrombocytopenia ([place]) Platelet 115 (226 on 03/19/2022), unclear etiology.  May be due to ongoing infection of right hand cellulitis -Check LDH -Check peripheral smear  Tobacco use - Nicotine patch  Type II diabetes mellitus with renal manifestations (HCC) Recent A1c 6.7, well controlled.  Patient is taking Ozempic, Humalog, Farxiga -Sliding scale insulin  Neuropathy - Start Neurontin 100 mg twice daily -Check vitamin B12 and vitamin B1 levels  Acute metabolic encephalopathy Etiology is not clear.  CT head negative.  Possibly due to narcotic use.  Other  differential diagnosis include delirium, ongoing infection. -Frequent neurochecks  Iron deficiency anemia Hemoglobin stable, 10.4 today (10.4 on 03/19/2022). -Follow-up with CBC  COPD (chronic obstructive pulmonary disease) (HCC) - Stable -Bronchodilators  Cellulitis of right hand Patient has swelling, mild erythema and tenderness in the right hand dorsal aspect, indicating possible cellulitis.  Patient does not meets critical for sepsis. -IV Rocephin -Check ESR and CRP -Blood culture  Over weight BMI 25.10, body weight 81.6 kg. -Diet and exercise.   -Encourage to lose weight.           DVT ppx: SQ Lovenox  Code Status: Full code  Family Communication:   Yes, patient's daughter  by phone. His son is at bedside  Disposition Plan:  Anticipate discharge back to previous environment  Consults called:  none  Admission status and Level of care: Med-Surg:   for obs     Dispo: The patient is from: Home              Anticipated d/c is to: Home              Anticipated d/c date is: 1 day              Patient currently is not medically stable to d/c.    Severity of Illness:  The appropriate patient status for this patient is OBSERVATION. Observation status is judged to be reasonable and necessary in order to provide the required  intensity of service to ensure the patient's safety. The patient's presenting symptoms, physical exam findings, and initial radiographic and laboratory data in the context of their medical condition is felt to place them at decreased risk for further clinical deterioration. Furthermore, it is anticipated that the patient will be medically stable for discharge from the hospital within 2 midnights of admission.        Date of Service 07/05/2022    Ivor Costa [facility]   If 7PM-7AM, please contact night-coverage [email or web address] 07/05/2022, 6:36 PM

## 2022-07-05 NOTE — ED Notes (Signed)
New IV placed in order to collect blood for VBG as L arm IV wouldn't pull enough to do so though flushes well, without bleb and without pain.

## 2022-07-05 NOTE — Assessment & Plan Note (Signed)
BMI 25.10, body weight 81.6 kg. -Diet and exercise.   -Encourage to lose weight.

## 2022-07-05 NOTE — Assessment & Plan Note (Signed)
Hemoglobin stable, 10.4 today (10.4 on 03/19/2022). -Follow-up with CBC

## 2022-07-05 NOTE — ED Notes (Signed)
pts daughter/ Eda Keys called [phone]) and left message.  Return call and she requests that her Dad be sent to [facility].  I explained that he had an accepting doctor here and in order to be transferred he would need an accepting there.  She requested to speak to the charge RN.  Annie Main RN notified and daughters number provided.

## 2022-07-05 NOTE — ED Notes (Signed)
Called pharm at [phone] as since pt admitted to room 140, pt no longer showing up in ED pyxis to return meds held earlier due to pt not being able to pass swallow screen; pharm staff told this RN to tube to pt's new floor; tubed and called and notified pt's floor RN.

## 2022-07-05 NOTE — ED Notes (Signed)
Text sent to [facility] NP (hospitalist tonight) concerning daughter and transfer.

## 2022-07-05 NOTE — Assessment & Plan Note (Signed)
See above

## 2022-07-05 NOTE — Assessment & Plan Note (Signed)
Etiology is not clear.  CT head negative.  Possibly due to narcotic use.  Other differential diagnosis include delirium, ongoing infection. -Frequent neurochecks

## 2022-07-05 NOTE — ED Notes (Signed)
Upon entering the pt's room there was visible urine on the right side of the pt's bed and in the floor. The pt, his bedding, and the floor were cleaned. The pt was placed on a male purwick.

## 2022-07-05 NOTE — ED Notes (Signed)
Oxy IR not given as pt is sedated from previous pain meds.

## 2022-07-05 NOTE — ED Provider Notes (Signed)
[facility] Provider Note    Event Date/Time   First MD Initiated Contact with Patient 07/05/22 0134     (approximate)   History   Fall, Loss of Consciousness, and Weakness   HPI  Albert Figueroa is a 66 y.o. male who presents to the ED for evaluation of Fall, Loss of Consciousness, and Weakness   I reviewed PCP visit from May.  History of DM, CKD, HTN, obesity, HLD and tobacco use.  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  On Depakote.  Chronic pain disorder on regular oxycodone 4 times per day  Patient presents to the ED, accompanied by good male friend, for evaluation of generalized pain and right wrist pain after a fall that occurred about 2 days ago.  He reports due to his neuropathy and chronic gait instability he had a fall Tuesday night causing him to [facility] on his right hand.  Since that time, he has had diffuse and worsening pain throughout his body.  Reports pain so severe to his right wrist he has trouble even moving it.  Temp earlier today of 100.3, but no other focal symptoms beyond generalized pain.  When reviewing blood work in care everywhere, his baseline creatinine seems to be around 2.5 with a GFR of around 25-28.   Physical Exam   Triage Vital Signs: ED Triage Vitals  Enc Vitals Group     BP 07/05/22 0120 103/63     Pulse Rate 07/05/22 0120 82     Resp 07/05/22 0120 16     Temp 07/05/22 0120 98.6 F (37 C)     Temp Source 07/05/22 0120 Oral     SpO2 07/05/22 0120 97 %     Weight 07/05/22 0124 180 lb (81.6 kg)     Height 07/05/22 0124 5\' 11"  (1.803 m)     Head Circumference --      Peak Flow --      Pain Score 07/05/22 0121 7     Pain Loc --      Pain Edu? --      Excl. in [place]? --     Most recent vital signs: Vitals:   07/05/22 0500 07/05/22 0630  BP: 129/67 (!) 123/56  Pulse: 89 87  Resp: (!) 21 13  Temp:  98 F (36.7 C)  SpO2: 95% 96%    General: Awake, no distress.  Seems uncomfortable, but not  distressed. CV:  Good peripheral perfusion.  Resp:  Normal effort.  Abd:  No distention.  MSK:  Some mild dorsal soft tissue swelling to the proximal hand and wrist on the right side is noted with some scattered superficial abrasions and bruising of different stages throughout much of the extremities.  No laceration or open injury.  Tender to palpation throughout the metacarpals and the wrist on the right side. Neuro:  No focal deficits appreciated. Other:     ED Results / Procedures / Treatments   Labs (all labs ordered are listed, but only abnormal results are displayed) Labs Reviewed  BASIC METABOLIC PANEL - Abnormal; Notable for the following components:      Result Value   Glucose, Bld 166 (*)    BUN 42 (*)    Creatinine, Ser 2.74 (*)    Calcium 8.8 (*)    GFR, Estimated 25 (*)    All other components within normal limits  CBC - Abnormal; Notable for the following components:   RBC 3.37 (*)    Hemoglobin  10.4 (*)    HCT 34.8 (*)    MCV 103.3 (*)    MCHC 29.9 (*)    RDW 16.0 (*)    Platelets 115 (*)    All other components within normal limits  URINALYSIS, ROUTINE W REFLEX MICROSCOPIC - Abnormal; Notable for the following components:   Color, Urine YELLOW (*)    APPearance CLEAR (*)    Glucose, UA >=500 (*)    All other components within normal limits  CK - Abnormal; Notable for the following components:   Total CK 47 (*)    All other components within normal limits  HEPATIC FUNCTION PANEL - Abnormal; Notable for the following components:   Albumin 3.3 (*)    Bilirubin, Direct 0.3 (*)    All other components within normal limits  CBG MONITORING, ED - Abnormal; Notable for the following components:   Glucose-Capillary 147 (*)    All other components within normal limits  CBG MONITORING, ED - Abnormal; Notable for the following components:   Glucose-Capillary 170 (*)    All other components within normal limits  SARS CORONAVIRUS 2 BY RT PCR  TROPONIN I (HIGH  SENSITIVITY)  TROPONIN I (HIGH SENSITIVITY)    EKG Sinus rhythm with a rate of 72 bpm.  Normal axis and intervals.  No clear signs of acute ischemia.  RADIOLOGY Plain film of the right wrist and hand interpreted by me without evidence of fracture or dislocation. CT head interpreted by me without evidence of acute intracranial pathology CT C-spine interpreted by me without evidence of fracture dislocation  Official radiology report(s): DG Wrist Complete Right  Result Date: 07/05/2022 CLINICAL DATA:  Recent fall with right wrist pain, initial encounter EXAM: RIGHT WRIST - COMPLETE 3+ VIEW COMPARISON:  None Available. FINDINGS: Changes of prior fracture along the posterior aspect of the distal radius are seen with nonunion. No acute fracture is noted. Widening of the scapholunate space is noted consistent with chronic ligamentous injury. IMPRESSION: Chronic changes without acute abnormality. Electronically Signed   By: Inez Catalina M.D.   On: 07/05/2022 02:45   DG Hand Complete Right  Result Date: 07/05/2022 CLINICAL DATA:  Right hand pain following fall, initial encounter EXAM: RIGHT HAND - COMPLETE 3+ VIEW COMPARISON:  None Available. 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 No acute fracture is seen. Interphalangeal degenerative changes are noted. No gross soft tissue abnormality is noted. Changes of prior distal radial fracture with nonunion are noted posteriorly. IMPRESSION: Chronic changes without acute abnormality. Electronically Signed   By: Inez Catalina M.D.   On: 07/05/2022 02:44   CT HEAD WO CONTRAST  Result Date: 07/05/2022 CLINICAL DATA:  Fall 2 days ago with headaches and neck pain, initial encounter EXAM: CT HEAD WITHOUT CONTRAST CT CERVICAL SPINE WITHOUT CONTRAST TECHNIQUE: Multidetector CT imaging of the head and cervical spine was performed following the standard protocol  without intravenous contrast. Multiplanar CT image reconstructions of the cervical spine were also generated. RADIATION DOSE REDUCTION: This exam was performed according to the departmental dose-optimization program which includes automated exposure control, adjustment of the mA and/or kV according to patient size and/or use of iterative reconstruction technique. COMPARISON:  None Available. FINDINGS: CT HEAD FINDINGS Brain: Mild atrophic changes are noted. No findings to suggest acute hemorrhage, acute infarction or space-occupying mass lesion are noted. A right-sided ventricular shunt catheter is noted extending into the right lateral ventricle. 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 Vascular: No hyperdense vessel or unexpected calcification. Skull: Normal. Negative for fracture or focal lesion. Sinuses/Orbits: No acute finding. Other: None. CT CERVICAL SPINE FINDINGS Alignment: Within normal limits. Skull base and vertebrae: 7 cervical segments are well visualized. Vertebral body height is well maintained. Multilevel osteophytic changes and facet hypertrophic changes are seen. No acute fracture or acute facet abnormality is noted. Soft tissues and spinal canal: Surrounding soft tissue structures show a right-sided shunt catheter as previously described. No other focal abnormality is noted. Upper chest: Visualized lung apices are within normal limits. Other: None IMPRESSION: CT of the head: Chronic changes without acute abnormality. CT of the cervical spine: Multilevel degenerative change without acute abnormality. Electronically Signed   By: Inez Catalina M.D.   On: 07/05/2022 02:29   CT Cervical Spine Wo Contrast  Result Date: 07/05/2022 CLINICAL DATA:  Fall 2 days ago with headaches and neck pain, initial encounter EXAM: CT HEAD WITHOUT CONTRAST CT CERVICAL SPINE WITHOUT CONTRAST TECHNIQUE: Multidetector CT imaging of the head and cervical spine was performed following the standard protocol without  intravenous contrast. Multiplanar CT image reconstructions of the cervical spine were also generated. RADIATION DOSE REDUCTION: This exam was performed according to the departmental dose-optimization program which includes automated exposure control, adjustment of the mA and/or kV according to patient size and/or use of iterative reconstruction technique. COMPARISON:  None Available. FINDINGS: CT HEAD FINDINGS Brain: Mild atrophic changes are noted. No findings to suggest acute hemorrhage, acute infarction or space-occupying mass lesion are noted. A right-sided ventricular shunt catheter is noted extending into the right lateral ventricle. 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 Vascular: No hyperdense vessel or unexpected calcification. Skull: Normal. Negative for fracture or focal lesion. Sinuses/Orbits: No acute finding. Other: None. CT CERVICAL SPINE FINDINGS Alignment: Within normal limits. Skull base and vertebrae: 7 cervical segments are well visualized. Vertebral body height is well maintained. Multilevel osteophytic changes and facet hypertrophic changes are seen. No acute fracture or acute facet abnormality is noted. Soft tissues and spinal canal: Surrounding soft tissue structures show a right-sided shunt catheter as previously described. No other focal abnormality is noted. Upper chest: Visualized lung apices are within normal limits. Other: None IMPRESSION: CT of the head: Chronic changes without acute abnormality. CT of the cervical spine: Multilevel degenerative change without acute abnormality. Electronically Signed   By: Inez Catalina M.D.   On: 07/05/2022 02:29    PROCEDURES and INTERVENTIONS:  .1-3 Lead EKG Interpretation  Performed by: Vladimir Crofts, MD Authorized by: Vladimir Crofts, MD     Interpretation: normal     ECG rate:  80   ECG rate assessment: normal     Rhythm: sinus rhythm     Ectopy: none     Conduction: normal     Medications  oxyCODONE (Oxy IR/ROXICODONE)  immediate release tablet 20 mg (has no administration in time range)  ondansetron (ZOFRAN) injection 4 mg (has no administration in time range)  acetaminophen (TYLENOL) tablet 650 mg (has no administration in time range)    Or  acetaminophen (TYLENOL) suppository 650 mg (has no administration in time range)  HYDROcodone-acetaminophen (NORCO/VICODIN) 5-325 MG per tablet 1-2 tablet (has no administration in time range)  morphine (PF) 2 MG/ML injection 2 mg (has no administration in time range)  acetaminophen (TYLENOL) tablet 1,000 mg (1,000 mg Oral Given 07/05/22 0248)  oxyCODONE (Oxy IR/ROXICODONE) immediate release tablet 5 mg (5 mg Oral Given 07/05/22 0248)  HYDROmorphone (DILAUDID) injection 2 mg (2 mg  Intravenous Given 07/05/22 0316)  lactated ringers bolus 1,000 mL (0 mLs Intravenous Stopped 07/05/22 0455)     IMPRESSION / MDM / ASSESSMENT AND PLAN / ED COURSE  I reviewed the triage vital signs and the nursing notes.  Differential diagnosis includes, but is not limited to, wrist fracture, open fracture, rhabdomyolysis, seizure, syncope, hyperkalemia  {Patient presents with symptoms of an acute illness or injury that is potentially life-threatening.  66 year old male with chronic pain syndrome presents to the ED with acute on chronic diffuse pain and particularly right wrist pain after a fall that occurred a couple days ago.  He is quite uncomfortable and requires fairly large doses of IV narcotics for pain control.  He has no signs of neurologic or vascular deficits.  Some mild swelling to the dorsal aspect of his right wrist, but no evidence of significant deformity otherwise.  Blood work with CKD around baseline, no leukocytosis or elevated CK to suggest rhabdo.  Imaging is reassuring but he has poorly controlled pain and lives by himself and unable to get around as he typically does after this recent fall so we will consult with medicine for admission and PT.  Clinical Course as of 07/05/22  0708  Fri Jul 05, 2022  0445 Reassessed.  Talked with his friend outside the room for little while. [DS]    Clinical Course User Index [DS] Vladimir Crofts, MD     FINAL CLINICAL IMPRESSION(S) / ED DIAGNOSES   Final diagnoses:  Fall, initial encounter  Chronic pain syndrome  Right wrist pain     Rx / DC Orders   ED Discharge Orders     None        Note:  This document was prepared using Dragon voice recognition software and may include unintentional dictation errors.   Vladimir Crofts, MD 07/05/22 [phone]

## 2022-07-05 NOTE — ED Notes (Signed)
R fa remains in velcro splint; R hand pink and warmer than upper fa. Attending Mora Bellman. notified via secure chat.

## 2022-07-05 NOTE — Assessment & Plan Note (Signed)
Stable -Bronchodilators 

## 2022-07-05 NOTE — TOC CM/SW Note (Signed)
Per chart review, patient is disoriented. CSW attempted call to patient's daughter [name] to discuss PT recommendation for SNF. Unable to leave a VM. TOC will continue to follow.   Oleh Genin, [place]

## 2022-07-05 NOTE — ED Notes (Signed)
Gave pt urinal and asked for urine sample, pt reports that he will do it himself and doesn't want help... pt unable to place urine appropriately and incontinent episode occurred.  Peri care and line change provided.

## 2022-07-05 NOTE — Assessment & Plan Note (Signed)
-   IV hydralazine as needed -Zestoretic

## 2022-07-05 NOTE — Assessment & Plan Note (Signed)
Patient has swelling, mild erythema and tenderness in the right hand dorsal aspect, indicating possible cellulitis.  Patient does not meets critical for sepsis. -IV Rocephin -Check ESR and CRP -Blood culture

## 2022-07-05 NOTE — Assessment & Plan Note (Signed)
Platelet 115 (226 on 03/19/2022), unclear etiology.  May be due to ongoing infection of right hand cellulitis -Check LDH -Check peripheral smear

## 2022-07-05 NOTE — TOC Initial Note (Signed)
Transition of Care [facility]) - Initial/Assessment Note    Patient Details  Name: Albert Figueroa MRN: [id number] Date of Birth: May 31, 1956  [facility] CM/SW Contact:    Magnus Ivan, LCSW Phone Number: 07/05/2022, 1:54 PM  Clinical Narrative:                 Albert Figueroa to daughter Albert Figueroa by phone. Patient is disoriented. Albert Figueroa stated patient lives alone and usually she or a friend takes patient to appointments.  PCP is Dr. Netty Starring. Patient has a cane at home. No SNF history. Had [facility] in the past, Albert Figueroa unsure of agency used.  CSW explained SNF recommendation from PT. Katie stated she is not sure patient will agree for SNF when he is less confused, but at this time she feels it is needed and is going to talk to patient about it. She states she is out of town until Sunday but will come to the hospital on Sunday to talk with patient. She stated she is agreeable to a SNF work up being started in the meantime.    Expected Discharge Plan: Skilled Nursing Facility Barriers to Discharge: Continued Medical Work up   Patient Goals and CMS Choice Patient states their goals for this hospitalization and ongoing recovery are:: SNF CMS Medicare.gov Compare Post Acute Care list provided to:: Patient Represenative (must comment) Choice offered to / list presented to : Adult Children  Expected Discharge Plan and Services Expected Discharge Plan: [place] arrangements for the past 2 months: Single Family Home                                      Prior Living Arrangements/Services Living arrangements for the past 2 months: Single Family Home Lives with:: Self Patient language and need for interpreter reviewed:: Yes        Need for Family Participation in Patient Care: Yes (Comment) Care giver support system in place?: Yes (comment) Current home services: DME Criminal Activity/Legal Involvement Pertinent to Current Situation/Hospitalization: No -  Comment as needed  Activities of Daily Living      Permission Sought/Granted Permission sought to share information with : Facility Art therapist granted to share information with : Yes, Verbal Permission Granted (by daughter Albert Figueroa)     Permission granted to share info w AGENCY: SNFs        Emotional Assessment       Orientation: : Fluctuating Orientation (Suspected and/or reported [place]) Alcohol / Substance Use: Not Applicable Psych Involvement: No (comment)  Admission diagnosis:  Intractable pain [R52] Patient Active Problem List   Diagnosis Date Noted   Intractable pain 07/05/2022   Type II diabetes mellitus with renal manifestations ([place]) 07/05/2022   Over weight 07/05/2022   Thrombocytopenia ([place]) 07/05/2022   Fall at home, initial encounter 07/05/2022   Fever 07/05/2022   COPD (chronic obstructive pulmonary disease) ([place]) 07/05/2022   Iron deficiency anemia 40/98/1191   Acute metabolic encephalopathy 47/82/9562   Neuropathy 07/05/2022   Right wrist pain    Chronic pain syndrome 01/01/2021   Pharmacologic therapy 01/01/2021   Disorder of skeletal system 01/01/2021   Problems influencing health status 01/01/2021   Macrocytic anemia 11/20/2020   Aortic atherosclerosis ([place]) 02/24/2020   Other emphysema ([place]) 02/24/2020   Medicare annual wellness visit, initial 02/03/2020   Neck pain 09/15/2018   Sacroiliac joint pain  09/08/2018   Lumbar spondylosis 06/11/2018   Left hip pain 06/11/2018   Chronic pain of both knees 06/11/2018   CKD (chronic kidney disease) stage 4, GFR 15-29 ml/min (HCC) 10/29/2017   Obesity (BMI 30-39.9) 11/27/2015   Type 2 diabetes mellitus with chronic kidney disease, with long-term current use of insulin ([place]) 06/13/2015   Tobacco use 06/13/2015   Essential hypertension 06/13/2015   Mixed hyperlipidemia 06/13/2015   Osteoarthritis 06/13/2015   Biceps tendinitis 04/06/2015   Epilepsy without status epilepticus,  not intractable ([place]) 12/10/2013   PCP:  Dion Body, MD Pharmacy:   [facility], [place] [address] [address] Phone: [phone] Fax: [place], [address] [place] [address] Phone: [phone] Fax: [phone]     Social Determinants of Health (SDOH) Interventions    Readmission Risk Interventions     No data to display

## 2022-07-05 NOTE — ED Notes (Addendum)
Pt incontinent of urine, linens changed and pt repositioned.

## 2022-07-05 NOTE — ED Notes (Signed)
Just confirmed with pt he is a type 1 diabetic as while attempting to collect VBG off L arm IV found attachment at L upper arm hidden under BP cuff placed by previous staff member. Will check CBG soon. Moved BP cuff to L wrist. L upper arm IV currently won't pull enough back for VBG so will place another IV. Visitors to bedside.

## 2022-07-05 NOTE — Assessment & Plan Note (Signed)
Renal function close to baseline.  Recent baseline creatinine 2.5-2.6.  His creatinine is at 2.74, BUN 42. -Follow-up by BMP

## 2022-07-05 NOTE — ED Notes (Addendum)
At bedside with all of pt's meds minus the 2 that come from pharm bc not yet received; meds currently on hold as pt cannot pass swallow screen; pt barely holds cup of water takes a sip, starts to fall asleep and pours some water on his chest as he begins to lose grip of cup. Will test again later. Attending Mora Bellman. notified. Awaiting reply via secure chat or new orders.

## 2022-07-05 NOTE — ED Notes (Signed)
36F straight cath using sterile technique to obtain urine.  Noted amber colored urine in collection bag.. sample sent for testing.  Pt tolerated well.

## 2022-07-05 NOTE — Evaluation (Signed)
Physical Therapy Evaluation Patient Details Name: Albert Figueroa MRN: [id number] DOB: 1956/07/12 Today's Date: 07/05/2022  History of Present Illness  Patient is a 66 year old male who presents s/p fall on Tuesday causing him to [facility] on his R hand. Since then has had diffuse and worsening pain in body. 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 Per patient's daughter patient has had 3 falls in past 5 days with worsening cognition in past five days.   Clinical Impression  Patient is a very confused 66 year old male who presents with instability and limited mobility. Patient unable to provide history, patient's daughter Albert Figueroa called for further information on history and PLOF. Prior to hospital admission, pt was independent and lives in a one story home. Per patient's daughter he had a cane he rarely used. However, the past five days she has noticed a significant change in mental status and he has started falling, reporting three falls in five days that she is aware of.  Currently pt is limited by orientation and ability to follow commands.  Patient is in bed needing a change in bedding and clean up of floor due to urinary incontinence. CNA and nurse notified and assisted in PT session to help clean room/bedding. Patient unable to provide history but follows simple commands with occasional consistency however is unable to follow complex or multi step commands. He requires max A to sit EOB and stand with additional assistance required in standing to remain in upright position. Patient requires max cueing for task orientation and safety. He is returned to clean bedding with max A.  Unable to formally assess muscle strength due to cognition.  Pt would benefit from skilled PT to address noted impairments and functional limitations (see below for any additional details).  Upon hospital discharge, pt would benefit  from SNF as he is unsafe for home at this time.        Recommendations for follow up therapy are one component of a multi-disciplinary discharge planning process, led by the attending physician.  Recommendations may be updated based on patient status, additional functional criteria and insurance authorization.  Follow Up Recommendations Skilled nursing-short term rehab (<3 hours/day) Can patient physically be transported by private vehicle: No    Assistance Recommended at Discharge Frequent or constant Supervision/Assistance  Patient can return home with the following  Two people to help with walking and/or transfers;Two people to help with bathing/dressing/bathroom;Assistance with cooking/housework;Direct supervision/assist for medications management;Direct supervision/assist for financial management;Assist for transportation;Help with stairs or ramp for entrance    Equipment Recommendations Other (comment) (None if SNF, will need re-assess if other)  Recommendations for Other Services       Functional Status Assessment Patient has had a recent decline in their functional status and demonstrates the ability to make significant improvements in function in a reasonable and predictable amount of time.     Precautions / Restrictions Precautions Precautions: Fall Required Braces or Orthoses: Other Brace Other Brace: R wrist brace Restrictions Weight Bearing Restrictions: No      Mobility  Bed Mobility Overal bed mobility: Needs Assistance Bed Mobility: Rolling, Supine to Sit, Sit to Supine Rolling: Mod assist   Supine to sit: Max assist Sit to supine: Max assist   General bed mobility comments: Patient has limited command follow    Transfers Overall transfer level: Needs assistance Equipment used: Rolling walker (2 wheels) Transfers: Sit to/from Stand Sit to  Stand: Max assist, From elevated surface           General transfer comment: patient requires max cueing for  sequencing and placement as well as assitance to stand    Ambulation/Gait               General Gait Details: unable to safely ambulate  Stairs            Wheelchair Mobility    Modified Rankin (Stroke Patients Only)       Balance Overall balance assessment: Needs assistance, History of Falls Sitting-balance support: Feet supported, Bilateral upper extremity supported Sitting balance-Leahy Scale: Poor Sitting balance - Comments: swaying of trunk requires close CGA   Standing balance support: Bilateral upper extremity supported, Reliant on assistive device for balance Standing balance-Leahy Scale: Poor Standing balance comment: requires mod A to remain standing                             Pertinent Vitals/Pain Pain Assessment Pain Assessment: Faces Faces Pain Scale: Hurts even more Pain Location: R wrist Pain Intervention(s): Monitored during session, Repositioned    Home Living Family/patient expects to be discharged to:: Private residence Living Arrangements: Alone Available Help at Discharge: Family Type of Home: House Home Access: Stairs to enter Entrance Stairs-Rails: Psychiatric nurse of Steps: 3-4   Home Layout: One level Home Equipment: Cane - single point Additional Comments: Patient unable to provide history. Per patient's daughter patient was independent with occasional use of cane until last five days.    Prior Function Prior Level of Function : Independent/Modified Independent             Mobility Comments: Per daughter patient has been independent with walking with occasional use of cane but prior to last five days is very active. In the past 5 days has had 3 falls ADLs Comments: Per daughter: patient is able to bath and dress himself independently without assistance prior to past five days     Hand Dominance        Extremity/Trunk Assessment   Upper Extremity Assessment Upper Extremity Assessment:  Defer to OT evaluation    Lower Extremity Assessment Lower Extremity Assessment: Difficult to assess due to impaired cognition (patient unable to follow commands for muscle testing.)    Cervical / Trunk Assessment Cervical / Trunk Assessment: Normal  Communication   Communication: Other (comment) (level of conciousness)  Cognition Arousal/Alertness: Lethargic Behavior During Therapy: Flat affect (limited interaction with therapist) Overall Cognitive Status: Impaired/Different from baseline Area of Impairment: Orientation, Attention, Memory, Following commands, Safety/judgement, Problem solving                 Orientation Level: Disoriented to, Place, Time, Situation Current Attention Level: Divided Memory: Decreased recall of precautions, Decreased short-term memory Following Commands: Follows one step commands consistently, Follows multi-step commands inconsistently Safety/Judgement: Decreased awareness of safety, Decreased awareness of deficits   Problem Solving: Requires verbal cues, Requires tactile cues General Comments: Patient is oriented to self only. History has to be taken from daughter Albert Figueroa.        General Comments General comments (skin integrity, edema, etc.): Patient is very disoriented. Has brace on R wrist.    Exercises Other Exercises Other Exercises: Patient educated on role of PT in acute care setting, safe mobility and transfers, and decreased fall risk techniques. Changing of patient's bed and clothing performed.   Assessment/Plan    PT Assessment Patient needs  continued PT services  PT Problem List Decreased strength;Decreased activity tolerance;Decreased balance;Decreased mobility;Decreased coordination;Decreased knowledge of precautions;Decreased safety awareness;Decreased knowledge of use of DME;Decreased cognition;Obesity;Pain       PT Treatment Interventions DME instruction;Gait training;Stair training;Functional mobility  training;Therapeutic activities;Therapeutic exercise;Balance training;Neuromuscular re-education;Cognitive remediation;Patient/family education;Manual techniques    PT Goals (Current goals can be found in the Care Plan section)  Acute Rehab PT Goals PT Goal Formulation: Patient unable to participate in goal setting    Frequency Min 2X/week     Co-evaluation               AM-PAC PT "6 Clicks" Mobility  Outcome Measure Help needed turning from your back to your side while in a flat bed without using bedrails?: A Lot Help needed moving from lying on your back to sitting on the side of a flat bed without using bedrails?: A Lot Help needed moving to and from a bed to a chair (including a wheelchair)?: A Lot Help needed standing up from a chair using your arms (e.g., wheelchair or bedside chair)?: A Lot Help needed to walk in hospital room?: Total Help needed climbing 3-5 steps with a railing? : Total 6 Click Score: 10    End of Session Equipment Utilized During Treatment: Gait belt Activity Tolerance: Patient limited by lethargy Patient left: in bed;with bed alarm set Nurse Communication: Mobility status PT Visit Diagnosis: Unsteadiness on feet (R26.81);Other abnormalities of gait and mobility (R26.89);Repeated falls (R29.6);Muscle weakness (generalized) (M62.81);History of falling (Z91.81);Difficulty in walking, not elsewhere classified (R26.2);Pain Pain - Right/Left: Right Pain - part of body: Arm    Time: 1003-1027 PT Time Calculation (min) (ACUTE ONLY): 24 min   Charges:   PT Evaluation $PT Eval Moderate Complexity: 1 Mod PT Treatments $Therapeutic Activity: 8-22 mins        Janna Arch, PT, DPT  07/05/2022, 11:08 AM

## 2022-07-06 ENCOUNTER — Observation Stay: Payer: Medicare Other

## 2022-07-06 DIAGNOSIS — Z79899 Other long term (current) drug therapy: Secondary | ICD-10-CM | POA: Diagnosis not present

## 2022-07-06 DIAGNOSIS — G928 Other toxic encephalopathy: Secondary | ICD-10-CM | POA: Diagnosis present

## 2022-07-06 DIAGNOSIS — G40909 Epilepsy, unspecified, not intractable, without status epilepticus: Secondary | ICD-10-CM | POA: Diagnosis present

## 2022-07-06 DIAGNOSIS — R55 Syncope and collapse: Secondary | ICD-10-CM | POA: Diagnosis present

## 2022-07-06 DIAGNOSIS — E785 Hyperlipidemia, unspecified: Secondary | ICD-10-CM | POA: Diagnosis present

## 2022-07-06 DIAGNOSIS — E663 Overweight: Secondary | ICD-10-CM | POA: Diagnosis present

## 2022-07-06 DIAGNOSIS — R52 Pain, unspecified: Secondary | ICD-10-CM | POA: Diagnosis not present

## 2022-07-06 DIAGNOSIS — W109XXA Fall (on) (from) unspecified stairs and steps, initial encounter: Secondary | ICD-10-CM | POA: Diagnosis present

## 2022-07-06 DIAGNOSIS — D509 Iron deficiency anemia, unspecified: Secondary | ICD-10-CM | POA: Diagnosis present

## 2022-07-06 DIAGNOSIS — Z794 Long term (current) use of insulin: Secondary | ICD-10-CM | POA: Diagnosis not present

## 2022-07-06 DIAGNOSIS — G894 Chronic pain syndrome: Secondary | ICD-10-CM | POA: Diagnosis present

## 2022-07-06 DIAGNOSIS — E1122 Type 2 diabetes mellitus with diabetic chronic kidney disease: Secondary | ICD-10-CM | POA: Diagnosis present

## 2022-07-06 DIAGNOSIS — J439 Emphysema, unspecified: Secondary | ICD-10-CM | POA: Diagnosis present

## 2022-07-06 DIAGNOSIS — N184 Chronic kidney disease, stage 4 (severe): Secondary | ICD-10-CM | POA: Diagnosis present

## 2022-07-06 DIAGNOSIS — E1140 Type 2 diabetes mellitus with diabetic neuropathy, unspecified: Secondary | ICD-10-CM | POA: Diagnosis present

## 2022-07-06 DIAGNOSIS — W19XXXA Unspecified fall, initial encounter: Secondary | ICD-10-CM | POA: Diagnosis not present

## 2022-07-06 DIAGNOSIS — T40605A Adverse effect of unspecified narcotics, initial encounter: Secondary | ICD-10-CM | POA: Diagnosis present

## 2022-07-06 DIAGNOSIS — Z888 Allergy status to other drugs, medicaments and biological substances status: Secondary | ICD-10-CM | POA: Diagnosis not present

## 2022-07-06 DIAGNOSIS — M48061 Spinal stenosis, lumbar region without neurogenic claudication: Secondary | ICD-10-CM | POA: Diagnosis present

## 2022-07-06 DIAGNOSIS — I129 Hypertensive chronic kidney disease with stage 1 through stage 4 chronic kidney disease, or unspecified chronic kidney disease: Secondary | ICD-10-CM | POA: Diagnosis present

## 2022-07-06 DIAGNOSIS — F17210 Nicotine dependence, cigarettes, uncomplicated: Secondary | ICD-10-CM | POA: Diagnosis present

## 2022-07-06 DIAGNOSIS — L03113 Cellulitis of right upper limb: Secondary | ICD-10-CM | POA: Diagnosis present

## 2022-07-06 DIAGNOSIS — M1990 Unspecified osteoarthritis, unspecified site: Secondary | ICD-10-CM | POA: Diagnosis present

## 2022-07-06 DIAGNOSIS — Z1152 Encounter for screening for COVID-19: Secondary | ICD-10-CM | POA: Diagnosis not present

## 2022-07-06 DIAGNOSIS — Z982 Presence of cerebrospinal fluid drainage device: Secondary | ICD-10-CM | POA: Diagnosis not present

## 2022-07-06 DIAGNOSIS — Z8661 Personal history of infections of the central nervous system: Secondary | ICD-10-CM | POA: Diagnosis not present

## 2022-07-06 DIAGNOSIS — Z79891 Long term (current) use of opiate analgesic: Secondary | ICD-10-CM | POA: Diagnosis not present

## 2022-07-06 DIAGNOSIS — D696 Thrombocytopenia, unspecified: Secondary | ICD-10-CM | POA: Diagnosis present

## 2022-07-06 LAB — BASIC METABOLIC PANEL
Anion gap: 9 (ref 5–15)
BUN: 35 mg/dL — ABNORMAL HIGH (ref 8–23)
CO2: 25 mmol/L (ref 22–32)
Calcium: 9.1 mg/dL (ref 8.9–10.3)
Chloride: 110 mmol/L (ref 98–111)
Creatinine, Ser: 2.03 mg/dL — ABNORMAL HIGH (ref 0.61–1.24)
GFR, Estimated: 36 mL/min — ABNORMAL LOW (ref >60)
Glucose, Bld: 162 mg/dL — ABNORMAL HIGH (ref 70–99)
Potassium: 4.1 mmol/L (ref 3.5–5.1)
Sodium: 144 mmol/L (ref 135–145)

## 2022-07-06 LAB — GLUCOSE, CAPILLARY
Glucose-Capillary: 168 mg/dL — ABNORMAL HIGH (ref 70–99)
Glucose-Capillary: 170 mg/dL — ABNORMAL HIGH (ref 70–99)
Glucose-Capillary: 177 mg/dL — ABNORMAL HIGH (ref 70–99)
Glucose-Capillary: 212 mg/dL — ABNORMAL HIGH (ref 70–99)

## 2022-07-06 MED ORDER — OXYCODONE HCL 5 MG PO TABS
10.0000 mg | ORAL_TABLET | ORAL | Status: DC | PRN
  Administered 2022-07-06 – 2022-07-10 (×10): 10 mg via ORAL
  Filled 2022-07-06 (×10): qty 2

## 2022-07-06 MED ORDER — TIZANIDINE HCL 4 MG PO TABS
4.0000 mg | ORAL_TABLET | Freq: Every day | ORAL | Status: DC
  Administered 2022-07-06 – 2022-07-09 (×4): 4 mg via ORAL
  Filled 2022-07-06 (×4): qty 1

## 2022-07-06 MED ORDER — ENOXAPARIN SODIUM 40 MG/0.4ML IJ SOSY
40.0000 mg | PREFILLED_SYRINGE | INTRAMUSCULAR | Status: DC
  Administered 2022-07-07 – 2022-07-10 (×4): 40 mg via SUBCUTANEOUS
  Filled 2022-07-06 (×4): qty 0.4

## 2022-07-06 MED ORDER — IPRATROPIUM-ALBUTEROL 0.5-2.5 (3) MG/3ML IN SOLN
3.0000 mL | Freq: Three times a day (TID) | RESPIRATORY_TRACT | Status: DC
  Administered 2022-07-06: 3 mL via RESPIRATORY_TRACT
  Filled 2022-07-06 (×3): qty 3

## 2022-07-06 NOTE — Plan of Care (Signed)
  Problem: Coping: Goal: Ability to adjust to condition or change in health will improve Outcome: Progressing   Problem: Fluid Volume: Goal: Ability to maintain a balanced intake and output will improve Outcome: Progressing   Problem: Health Behavior/Discharge Planning: Goal: Ability to identify and utilize available resources and services will improve Outcome: Progressing   Problem: Metabolic: Goal: Ability to maintain appropriate glucose levels will improve Outcome: Progressing   Problem: Nutritional: Goal: Progress toward achieving an optimal weight will improve Outcome: Progressing   Problem: Skin Integrity: Goal: Risk for impaired skin integrity will decrease Outcome: Progressing   Problem: Tissue Perfusion: Goal: Adequacy of tissue perfusion will improve Outcome: Progressing   Problem: Education: Goal: Knowledge of General Education information will improve Description: Including pain rating scale, medication(s)/side effects and non-pharmacologic comfort measures Outcome: Progressing   Problem: Health Behavior/Discharge Planning: Goal: Ability to manage health-related needs will improve Outcome: Progressing   Problem: Clinical Measurements: Goal: Cardiovascular complication will be avoided Outcome: Progressing   Problem: Activity: Goal: Risk for activity intolerance will decrease Outcome: Progressing   Problem: Nutrition: Goal: Adequate nutrition will be maintained Outcome: Progressing   Problem: Coping: Goal: Level of anxiety will decrease Outcome: Progressing   Problem: Pain Managment: Goal: General experience of comfort will improve Outcome: Progressing   Problem: Safety: Goal: Ability to remain free from injury will improve Outcome: Progressing   Problem: Skin Integrity: Goal: Risk for impaired skin integrity will decrease Outcome: Progressing

## 2022-07-06 NOTE — Evaluation (Signed)
Clinical/Bedside Swallow Evaluation Patient Details  Name: MORRILL BOMKAMP MRN: [id number] Date of Birth: January 12, 1956  Today's Date: 07/06/2022 Time: SLP Start Time (ACUTE ONLY): 0845 SLP Stop Time (ACUTE ONLY): 0940 SLP Time Calculation (min) (ACUTE ONLY): 55 min  Past Medical History:  Past Medical History:  Diagnosis Date   Arthritis    Diabetes mellitus without complication ([place])    Emphysema/COPD ([place])    Hyperlipidemia    Hypertension    Kidney disease    stage 3   Lumbar stenosis    Osteoarthrosis    Seizure ([place])    Past Surgical History:  Past Surgical History:  Procedure Laterality Date   KNEE SURGERY     multiple surgeries on both knees   HPI:  Pt is a 66 y.o. 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     Pt states that he fell 2 days ago after he tripped on the steps.  He states that he has neuropathy and chronic gait instability which caused his fall. He denies loss of consciousness to me.  He has history of chronic pain syndrome on oxycodone 20 mg every 4-6 hours daily as needed (maximum 5 tablets).  He states that after fall, his pain has significantly worsened.  He states that he has pain all over, the worst pain is in right wrist and right hand.  The right wrist and hand pain is constant, sharp, severe, intractable, nonradiating.  The right hand is mildly swelling with mild erythema on examination.  Patient also has moderate pain in the right shoulder.  Patient does not have cough, shortness breath, chest pain.  No nausea, vomiting, diarrhea or abdominal pain.  Denies symptoms of UTI.     Patient was given multiple doses of pain medication in the ED causing him to be Lethargic.   Head and Cervical Spine CT: Chronic changes without  acute abnormality.     CT of the cervical spine: Multilevel degenerative change without  acute abnormality.  Lungs wnl, clear.    Assessment / Plan / Recommendation  Clinical Impression   Pt awake, verbal. Requesting something to drink; noted extremely dry oral cavity -- oral care provided. Discomfort in RUE/shoulder when moving about in bed and tight RE splint - NSG made aware. Pt was min confused about how long he had been in the hospital -- he stayed at home for several days s/p the fall stating "I didn't want to come to the hospital". On RA, afebrile. WBC WNL.   Pt appears to present w/ adequate oropharyngeal phase swallow function in setting of Edentulous status w/ No oropharyngeal phase dysphagia noted, No neuromuscular deficits noted. Pt consumed po trials w/ No overt, clinical s/s of aspiration during po trials. Pt appears at reduced risk for aspiration following general aspiration precautions.   However, pt does have challenging factors that could impact overall oral intake including Pain/discomfort(NSG aware) that impacts self-feeding also, Pain Meds(sedating meds), deconditioned/weakness, and Edentulous status. These factors can increase risk for dysphagia as well as decreased oral intake overall.   During po trials, pt consumed all consistencies w/ No overt coughing, decline in vocal quality, or change in respiratory presentation during/post trials. O2 sats 98%. Oral phase appeared grossly [facility] w/ timely bolus management, mashing/gumming, and control of bolus propulsion for A-P transfer for  swallowing. Oral clearing achieved w/ all trial consistencies -- moistened, soft foods given for ease of oral phase d/t Edentulous status.  OM Exam appeared [facility] w/ no unilateral weakness noted. Speech Clear. Pt fed self w/ setup and support d/t RUE weakness.   Recommend a fairly Regular consistency diet w/ well-Cut meats, moistened foods(d/t Edentulous status, choices overall); Thin liquids. Pt should  help to feed self and hold Cup when drinking but needs Support during meals d/t RUE immobility currently. Recommend general aspiration and Reflux precautions. Pills WHOLE in Puree for safer, easier swallowing for now.   Education given on Pills in Puree; food consistencies and easy to eat options; general aspiration precautions to pt. NSG to reconsult if any new needs arise. NSG updated, agreed. MD updated. Recommend Dietician f/u for support -- pt endorsed "some" weight loss and eating ~1 meal daily at home -- lives alone. MD updated.  SLP Visit Diagnosis: Dysphagia, unspecified (R13.10) (Edentulous; deconditioned)    Aspiration Risk   (recued following general precs)    Diet Recommendation   a fairly Regular consistency diet w/ well-Cut meats, moistened foods(d/t Edentulous status, choices overall); Thin liquids. Pt should help to feed self and hold Cup when drinking but needs Support during meals d/t RUE immobility currently. Recommend general aspiration and Reflux precautions  Medication Administration: Whole meds with puree (for safer swallowing while in hospital(bed))    Other  Recommendations Recommended Consults:  (Dietician f/u - pt endorses "some" weight loss and eating ~1 meal daily at home -- lives alone.) Oral Care Recommendations: Oral care BID;Patient independent with oral care Other Recommendations:  (n/a)    Recommendations for follow up therapy are one component of a multi-disciplinary discharge planning process, led by the attending physician.  Recommendations may be updated based on patient status, additional functional criteria and insurance authorization.  Follow up Recommendations No SLP follow up      Assistance Recommended at Discharge PRN  Functional Status Assessment Patient has not had a recent decline in their functional status (re: swallowing)  Frequency and Duration  (n/a)   (n/a)       Prognosis Prognosis for Safe Diet Advancement: Good Barriers to Reach  Goals: Time post onset;Severity of deficits Barriers/Prognosis Comment: Edentulous; deconditioned      Swallow Study   General Date of Onset: 07/05/22 HPI: Pt is a 66 y.o. 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     Pt states that he fell 2 days ago after he tripped on the steps.  He states that he has neuropathy and chronic gait instability which caused his fall. He denies loss of consciousness to me.  He has history of chronic pain syndrome on oxycodone 20 mg every 4-6 hours daily as needed (maximum 5 tablets).  He states that after fall, his pain has significantly worsened.  He states that he has pain all over, the worst pain is in right wrist and right hand.  The right wrist and hand pain is constant, sharp, severe, intractable, nonradiating.  The right hand is mildly swelling with mild erythema on examination.  Patient also has moderate pain in the right shoulder.  Patient does not have cough, shortness breath, chest pain.  No nausea, vomiting, diarrhea or abdominal pain.  Denies symptoms of UTI.     Patient  was given multiple doses of pain medication in the ED causing him to be Lethargic.   Head and Cervical Spine CT: Chronic changes without acute abnormality.     CT of the cervical spine: Multilevel degenerative change without  acute abnormality.  Lungs wnl, clear. Type of Study: Bedside Swallow Evaluation Previous Swallow Assessment: none Diet Prior to this Study: NPO (lethargic) Temperature Spikes Noted: No (wbc 6.3) Respiratory Status: Room air History of Recent Intubation: No Behavior/Cognition: Alert;Cooperative;Pleasant mood;Requires cueing (though he had been here "a week now" but had been at home for several days s/p fall and "didn't want to  come to the hospital".) Oral Cavity Assessment: Dry;Dried secretions Oral Care Completed by SLP: Yes Oral Cavity - Dentition: Edentulous Vision: Functional for self-feeding Self-Feeding Abilities: Able to feed self;Needs set up;Needs assist (cannot use RUE) Patient Positioning: Upright in bed (supported positioning) Baseline Vocal Quality: Normal Volitional Cough: Strong Volitional Swallow: Able to elicit    Oral/Motor/Sensory Function Overall Oral Motor/Sensory Function: Within functional limits   Ice Chips Ice chips: Within functional limits Presentation: Spoon (fed; 3 trials)   Thin Liquid Thin Liquid: Within functional limits Presentation: Cup;Self Fed;Straw (~12 ozs total)    Nectar Thick Nectar Thick Liquid: Not tested   Honey Thick Honey Thick Liquid: Not tested   Puree Puree: Within functional limits Presentation: Self Fed;Spoon (supported; 3 ozs)   Solid     Solid: Within functional limits (grossly) Presentation: Self Fed (6 tirals) Other Comments: moistened foods for ease of mashing/gumming        [name], MS, [place]; [place] [phone] (ascom)  Jeramy Dimmick 07/06/2022,10:20 AM

## 2022-07-06 NOTE — Progress Notes (Signed)
PROGRESS NOTE    Albert Figueroa  IZT:[id number] DOB: 1955-10-28 DOA: 07/05/2022 PCP: Dion Body, MD    Brief Narrative:  66 y.o. 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   Pt states that he fell 2 days ago after he tripped on the steps.  He states that he has neuropathy and chronic gait instability which caused his fall. He denies loss of consciousness to me.  He has history of chronic pain syndrome on oxycodone 20 mg every 4-6 hours daily as needed (maximum 5 tablets).  He states that after fall, his pain has significantly worsened.  He states that he has pain all over, the worst pain is in right wrist and right hand.  The right wrist and hand pain is constant, sharp, severe, intractable, nonradiating.  The right hand is mildly swelling with mild erythema on examination.  Patient also has moderate pain in the right shoulder.  Patient does not have cough, shortness breath, chest pain.  No nausea, vomiting, diarrhea or abdominal pain.  Denies symptoms of UTI.   Patient was given multiple doses of pain medication in the ED, including 2 mg Dilaudid, 20 mg of oxycodone, 5 mg oxycodone again due to uncontrolled pain. When I saw pt in ED, he is very lethargic, but arousable, moves all extremities, no facial droop or slurred speech. Per report, pt had temperature 100.3 earlier, currently body temperature 98 when saw patient in ED.  Mental status improved the following morning.  Patient swallowing ability back to baseline.  Mentation back to baseline.  Endorses significant pain in right hand associated with swelling and decreased range of motion.  CRP markedly elevated at 20.9.  Assessment & Plan:   Principal Problem:   Intractable pain Active Problems:    Chronic pain syndrome   CKD (chronic kidney disease) stage 4, GFR 15-29 ml/min (HCC)   Essential hypertension   Fall at home, initial encounter   Thrombocytopenia ([place])   Tobacco use   Type II diabetes mellitus with renal manifestations (HCC)   Neuropathy   Acute metabolic encephalopathy   Iron deficiency anemia   COPD (chronic obstructive pulmonary disease) (HCC)   Cellulitis of right hand   Over weight  * Intractable pain Patient has pain all over, but was down right wrist in the right hand.  X-ray negative for right wrist, right hand and right shoulder.  His right hand seems to have cellulitis.  Patient received multiple dose of pain medication, he becomes lethargic. Mental status back to baseline 10/7 Plan: Multimodal oral pain regimen Right wrist splint Check MRI right hand and wrist  Cellulitis of right hand Patient has swelling, mild erythema and tenderness in the right hand dorsal aspect, indicating possible cellulitis.  Patient does not meets criteria for sepsis. Inflammatory markers elevated Plan: Check MRI right hand and wrist with and without contrast Follow cultures, no growth to date  Chronic pain syndrome - See above   CKD (chronic kidney disease) stage 4, GFR 15-29 ml/min (HCC) Renal function close to baseline.  Recent baseline creatinine 2.5-2.6.  His creatinine is at 2.74, BUN 42. -Avoid nonessential nephrotoxins   Essential hypertension - IV hydralazine as needed -Zestoretic   Fall at home, initial encounter Imaging negative for acute fracture or dislocation. - Fall precaution -PT/OT   Thrombocytopenia ([place])  Platelet 115 (226 on 03/19/2022), unclear etiology.   May be due to ongoing infection of right hand cellulitis -Monitor with daily CBC   Tobacco use - Nicotine patch   Type II diabetes mellitus with renal manifestations (HCC) Recent A1c 6.7, well controlled.  Patient is taking Ozempic, Humalog, Farxiga -Sliding scale insulin    Neuropathy -3 times daily Neurontin   Acute metabolic encephalopathy Etiology is not clear.  CT head negative.  Possibly due to narcotic use.  Other differential diagnosis include delirium, ongoing infection. -Frequent neurochecks   Iron deficiency anemia Hemoglobin stable, 10.4 today (10.4 on 03/19/2022). -Follow-up with CBC   COPD (chronic obstructive pulmonary disease) (HCC) - Stable -Bronchodilators   Over weight BMI 25.10, body weight 81.6 kg. -Diet and exercise.   -Encourage to lose weight.  DVT prophylaxis: SQ Lovenox Code Status: Full Family Communication: None today.  Offered to call but patient declined Disposition Plan: Status is: Observation The patient will require care spanning > 2 midnights and should be moved to inpatient because: Cellulitis requiring IV antibiotics.  Intractable pain.  Concern for deep tissue infection.  Pending MRI right wrist and hand.   Level of care: Med-Surg  Consultants:  None  Procedures:  None  Antimicrobials: Ceftriaxone*  Subjective: Seen and examined.  Sitting comfortably in bed.  Mentating clearly.  No confusion or lethargy.  Endorses severe pain in right wrist and hand.  Decreased ROM.  Objective: Vitals:   07/05/22 1958 07/06/22 0033 07/06/22 0733 07/06/22 0853  BP:  105/67  (!) 159/79  Pulse:  80  81  Resp:  17  17  Temp:  98 F (36.7 C)  97.7 F (36.5 C)  TempSrc:      SpO2: 98% 96% 96% 99%  Weight:      Height:        Intake/Output Summary (Last 24 hours) at 07/06/2022 1103 Last data filed at 07/06/2022 0359 Gross per 24 hour  Intake 976.48 ml  Output 1250 ml  Net -273.52 ml   Filed Weights   07/05/22 0124  Weight: 81.6 kg    Examination:  General exam: Appears calm and comfortable  Respiratory system: Clear to auscultation. Respiratory effort normal. Cardiovascular system: S1-S2, RRR, no murmurs, no pedal edema Gastrointestinal system: Soft, NT/ND, normal bowel sounds Central nervous system:  Alert and oriented. No focal neurological deficits. Extremities: Right hand and wrist in splint, swollen, decreased ROM, tender to touch Skin: No rashes, lesions or ulcers Psychiatry: Judgement and insight appear normal. Mood & affect appropriate.     Data Reviewed: I have personally reviewed following labs and imaging studies  CBC: Recent Labs  Lab 07/05/22 0216  WBC 6.3  6.2  NEUTROABS 4.1  HGB 10.3*  10.4*  HCT 33.9*  34.8*  MCV 103.4*  103.3*  PLT 118*  161*   Basic Metabolic Panel: Recent Labs  Lab 07/05/22 0216 07/06/22 0412  NA 141 144  K 4.1 4.1  CL 108 110  CO2 26 25  GLUCOSE 166* 162*  BUN 42* 35*  CREATININE 2.74* 2.03*  CALCIUM 8.8* 9.1   GFR: Estimated Creatinine Clearance: 38.6 mL/min (A) (by C-G formula based on SCr of 2.03 mg/dL (H)). Liver Function Tests: Recent Labs  Lab 07/05/22 0215  AST 27  ALT 12  ALKPHOS 71  BILITOT 0.9  PROT 8.1  ALBUMIN 3.3*   No results for input(s): "LIPASE", "AMYLASE" in the last 168 hours. No results for input(s): "AMMONIA" in the last 168 hours. Coagulation Profile:  No results for input(s): "INR", "PROTIME" in the last 168 hours. Cardiac Enzymes: Recent Labs  Lab 07/05/22 0215  CKTOTAL 47*   BNP (last 3 results) No results for input(s): "PROBNP" in the last 8760 hours. HbA1C: No results for input(s): "HGBA1C" in the last 72 hours. CBG: Recent Labs  Lab 07/05/22 0208 07/05/22 1349 07/05/22 1723 07/06/22 0031 07/06/22 0858  GLUCAP 170* 143* 142* 177* 168*   Lipid Profile: No results for input(s): "CHOL", "HDL", "LDLCALC", "TRIG", "CHOLHDL", "LDLDIRECT" in the last 72 hours. Thyroid Function Tests: No results for input(s): "TSH", "T4TOTAL", "FREET4", "T3FREE", "THYROIDAB" in the last 72 hours. Anemia Panel: Recent Labs    07/05/22 1717  VITAMINB12 574   Sepsis Labs: Recent Labs  Lab 07/05/22 1500 07/05/22 1717  PROCALCITON  --  0.23  LATICACIDVEN 1.1 1.8    Recent Results (from  the past 240 hour(s))  SARS Coronavirus 2 by RT PCR (hospital order, performed in [facility] hospital lab) *cepheid single result test* Anterior Nasal Swab     Status: None   Collection Time: 07/05/22  3:17 AM   Specimen: Anterior Nasal Swab  Result Value Ref Range Status   SARS Coronavirus 2 by RT PCR NEGATIVE NEGATIVE Final    Comment: (NOTE) SARS-CoV-2 target nucleic acids are NOT DETECTED.  The SARS-CoV-2 RNA is generally detectable in upper and lower respiratory specimens during the acute phase of infection. The lowest concentration of SARS-CoV-2 viral copies this assay can detect is 250 copies / mL. A negative result does not preclude SARS-CoV-2 infection and should not be used as the sole basis for treatment or other patient management decisions.  A negative result may occur with improper specimen collection / handling, submission of specimen other than nasopharyngeal swab, presence of viral mutation(s) within the areas targeted by this assay, and inadequate number of viral copies (<250 copies / mL). A negative result must be combined with clinical observations, patient history, and epidemiological information.  Fact Sheet for Patients:   https://www.patel.info/  Fact Sheet for Healthcare Providers: https://hall.com/  This test is not yet approved or  cleared by the [place] FDA and has been authorized for detection and/or diagnosis of SARS-CoV-2 by FDA under an Emergency Use Authorization (EUA).  This EUA will remain in effect (meaning this test can be used) for the duration of the COVID-19 declaration under Section 564(b)(1) of the Act, 21 U.S.C. section 360bbb-3(b)(1), unless the authorization is terminated or revoked sooner.  Performed at [facility], [address] [place], [place] [address]   Culture, blood (Routine X 2) w Reflex to ID Panel     Status: None (Preliminary result)   Collection Time: 07/05/22   2:19 PM   Specimen: BLOOD  Result Value Ref Range Status   Specimen Description BLOOD BLOOD RIGHT ARM  Final   Special Requests   Final    BOTTLES DRAWN AEROBIC AND ANAEROBIC Blood Culture adequate volume   Culture   Final    NO GROWTH < 24 HOURS Performed at [facility] [address] [place], [place] [address]    Report Status PENDING  Incomplete  Culture, blood (Routine X 2) w Reflex to ID Panel     Status: None (Preliminary result)   Collection Time: 07/05/22  2:24 PM   Specimen: BLOOD  Result Value Ref Range Status   Specimen Description BLOOD BLOOD LEFT ARM  Final   Special Requests   Final    BOTTLES DRAWN AEROBIC AND ANAEROBIC Blood  Culture adequate volume   Culture   Final    NO GROWTH < 24 HOURS Performed at [facility], [address] [place], [place] [address]    Report Status PENDING  Incomplete         Radiology Studies: DG Shoulder Right  Result Date: 07/05/2022 CLINICAL DATA:  RIGHT shoulder pain after falling 2 days ago. EXAM: RIGHT SHOULDER - 2+ VIEW COMPARISON:  MRI of the shoulder from 2021 FINDINGS: Humeral head is located. Glenohumeral degenerative changes. Scapula is intact to the extent evaluated. Shunt tubing is incidentally noted crossing the field of view with extensive calcification along its course in the neck and chest wall. Soft tissues are otherwise unremarkable. IMPRESSION: 1. No acute fracture or dislocation. 2. Glenohumeral degenerative changes. 3. Shunt tubing with extensive calcification along its course in the neck and chest wall. Correlate with any signs of shunt malfunction. Electronically Signed   By: Zetta Bills M.D.   On: 07/05/2022 15:02   DG Wrist Complete Right  Result Date: 07/05/2022 CLINICAL DATA:  Recent fall with right wrist pain, initial encounter EXAM: RIGHT WRIST - COMPLETE 3+ VIEW COMPARISON:  None Available. FINDINGS: Changes of prior fracture along the posterior aspect of the distal radius are  seen with nonunion. No acute fracture is noted. Widening of the scapholunate space is noted consistent with chronic ligamentous injury. IMPRESSION: Chronic changes without acute abnormality. Electronically Signed   By: Inez Catalina M.D.   On: 07/05/2022 02:45   DG Hand Complete Right  Result Date: 07/05/2022 CLINICAL DATA:  Right hand pain following fall, initial encounter EXAM: RIGHT HAND - COMPLETE 3+ VIEW COMPARISON:  None Available. 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 No acute fracture is seen. Interphalangeal degenerative changes are noted. No gross soft tissue abnormality is noted. Changes of prior distal radial fracture with nonunion are noted posteriorly. IMPRESSION: Chronic changes without acute abnormality. Electronically Signed   By: Inez Catalina M.D.   On: 07/05/2022 02:44   CT HEAD WO CONTRAST  Result Date: 07/05/2022 CLINICAL DATA:  Fall 2 days ago with headaches and neck pain, initial encounter EXAM: CT HEAD WITHOUT CONTRAST CT CERVICAL SPINE WITHOUT CONTRAST TECHNIQUE: Multidetector CT imaging of the head and cervical spine was performed following the standard protocol without intravenous contrast. Multiplanar CT image reconstructions of the cervical spine were also generated. RADIATION DOSE REDUCTION: This exam was performed according to the departmental dose-optimization program which includes automated exposure control, adjustment of the mA and/or kV according to patient size and/or use of iterative reconstruction technique. COMPARISON:  None Available. FINDINGS: CT HEAD FINDINGS Brain: Mild atrophic changes are noted. No findings to suggest acute hemorrhage, acute infarction or space-occupying mass lesion are noted. A right-sided ventricular shunt catheter is noted extending into the right lateral ventricle. 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 Vascular: No hyperdense  vessel or unexpected calcification. Skull: Normal. Negative for fracture or focal lesion. Sinuses/Orbits: No acute finding. Other: None. CT CERVICAL SPINE FINDINGS Alignment: Within normal limits. Skull base and vertebrae: 7 cervical segments are well visualized. Vertebral body height is well maintained. Multilevel osteophytic changes and facet hypertrophic changes are seen. No acute fracture or acute facet abnormality is noted. Soft tissues and spinal canal: Surrounding soft tissue structures show a right-sided shunt catheter as previously described. No other focal abnormality is noted. Upper chest: Visualized lung apices are within normal limits. Other: None IMPRESSION: CT of the  head: Chronic changes without acute abnormality. CT of the cervical spine: Multilevel degenerative change without acute abnormality. Electronically Signed   By: Inez Catalina M.D.   On: 07/05/2022 02:29   CT Cervical Spine Wo Contrast  Result Date: 07/05/2022 CLINICAL DATA:  Fall 2 days ago with headaches and neck pain, initial encounter EXAM: CT HEAD WITHOUT CONTRAST CT CERVICAL SPINE WITHOUT CONTRAST TECHNIQUE: Multidetector CT imaging of the head and cervical spine was performed following the standard protocol without intravenous contrast. Multiplanar CT image reconstructions of the cervical spine were also generated. RADIATION DOSE REDUCTION: This exam was performed according to the departmental dose-optimization program which includes automated exposure control, adjustment of the mA and/or kV according to patient size and/or use of iterative reconstruction technique. COMPARISON:  None Available. FINDINGS: CT HEAD FINDINGS Brain: Mild atrophic changes are noted. No findings to suggest acute hemorrhage, acute infarction or space-occupying mass lesion are noted. A right-sided ventricular shunt catheter is noted extending into the right lateral ventricle. 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 Vascular: No hyperdense vessel or  unexpected calcification. Skull: Normal. Negative for fracture or focal lesion. Sinuses/Orbits: No acute finding. Other: None. CT CERVICAL SPINE FINDINGS Alignment: Within normal limits. Skull base and vertebrae: 7 cervical segments are well visualized. Vertebral body height is well maintained. Multilevel osteophytic changes and facet hypertrophic changes are seen. No acute fracture or acute facet abnormality is noted. Soft tissues and spinal canal: Surrounding soft tissue structures show a right-sided shunt catheter as previously described. No other focal abnormality is noted. Upper chest: Visualized lung apices are within normal limits. Other: None IMPRESSION: CT of the head: Chronic changes without acute abnormality. CT of the cervical spine: Multilevel degenerative change without acute abnormality. Electronically Signed   By: Inez Catalina M.D.   On: 07/05/2022 02:29        Scheduled Meds:  DULoxetine  30 mg Oral Daily   [START ON 07/07/2022] enoxaparin (LOVENOX) injection  40 mg Subcutaneous Q24H   fenofibrate  160 mg Oral Daily   ferrous sulfate  325 mg Oral Q breakfast   fluticasone furoate-vilanterol  1 puff Inhalation Daily   And   umeclidinium bromide  1 puff Inhalation Daily   gabapentin  100 mg Oral BID   lisinopril  20 mg Oral Daily   And   hydrochlorothiazide  25 mg Oral Daily   insulin aspart  0-5 Units Subcutaneous QHS   insulin aspart  0-9 Units Subcutaneous TID WC   ipratropium-albuterol  3 mL Inhalation TID   ketotifen  1 drop Both Eyes BID   lidocaine  1 patch Transdermal Q24H   loteprednol  1 drop Both Eyes QID   And   tobramycin  1 drop Both Eyes QID   nicotine  21 mg Transdermal Daily   pantoprazole  40 mg Oral Daily   roflumilast  250 mcg Oral QHS   rosuvastatin  20 mg Oral QHS   tiZANidine  4 mg Oral TID   Continuous Infusions:  sodium chloride 75 mL/hr at 07/06/22 0748   cefTRIAXone (ROCEPHIN)  IV     valproate sodium 1,000 mg (07/06/22 0952)     LOS: 0  days   Sidney Ace, MD [facility]   If 7PM-7AM, please contact night-coverage  07/06/2022, 11:03 AM

## 2022-07-06 NOTE — Plan of Care (Signed)

## 2022-07-06 NOTE — Progress Notes (Signed)
PHARMACIST - PHYSICIAN COMMUNICATION  CONCERNING:  Enoxaparin (Lovenox) for DVT Prophylaxis    RECOMMENDATION: Patient was prescribed enoxaparin 40mg  q24 hours for VTE prophylaxis.   Filed Weights   07/05/22 0124  Weight: 81.6 kg (180 lb)    Body mass index is 25.1 kg/m.  Estimated Creatinine Clearance: 38.6 mL/min (A) (by C-G formula based on SCr of 2.03 mg/dL (H)).  Patient is candidate for enoxaparin 40mg  every 24 hours based on CrCl >78ml/min and Weight >45kg  DESCRIPTION: Pharmacy has adjusted enoxaparin dose per [facility] policy.  Patient is now receiving enoxaparin 40 mg every 24 hours   Benita Gutter 07/06/2022 10:39 AM

## 2022-07-06 NOTE — Progress Notes (Signed)
Swelling in R hand this AM, it appeared the brace was very tight.  RN loosened brace and pt said it helped some.  Dr. Priscella Mann notified.

## 2022-07-07 ENCOUNTER — Inpatient Hospital Stay: Payer: Medicare Other

## 2022-07-07 DIAGNOSIS — R52 Pain, unspecified: Secondary | ICD-10-CM | POA: Diagnosis not present

## 2022-07-07 LAB — GLUCOSE, CAPILLARY
Glucose-Capillary: 146 mg/dL — ABNORMAL HIGH (ref 70–99)
Glucose-Capillary: 169 mg/dL — ABNORMAL HIGH (ref 70–99)
Glucose-Capillary: 155 mg/dL — ABNORMAL HIGH (ref 70–99)
Glucose-Capillary: 145 mg/dL — ABNORMAL HIGH (ref 70–99)

## 2022-07-07 LAB — CBC WITH DIFFERENTIAL/PLATELET
Abs Immature Granulocytes: 0.12 10*3/uL — ABNORMAL HIGH (ref 0.00–0.07)
Basophils Absolute: 0 10*3/uL (ref 0.0–0.1)
Basophils Relative: 1 %
Eosinophils Absolute: 0 10*3/uL (ref 0.0–0.5)
Eosinophils Relative: 0 %
HCT: 28.6 % — ABNORMAL LOW (ref 39.0–52.0)
Hemoglobin: 8.9 g/dL — ABNORMAL LOW (ref 13.0–17.0)
Immature Granulocytes: 1 %
Lymphocytes Relative: 9 %
Lymphs Abs: 0.8 10*3/uL (ref 0.7–4.0)
MCH: 31.2 pg (ref 26.0–34.0)
MCHC: 31.1 g/dL (ref 30.0–36.0)
MCV: 100.4 fL — ABNORMAL HIGH (ref 80.0–100.0)
Monocytes Absolute: 1.3 10*3/uL — ABNORMAL HIGH (ref 0.1–1.0)
Monocytes Relative: 15 %
Neutro Abs: 6.3 10*3/uL (ref 1.7–7.7)
Neutrophils Relative %: 74 %
Platelets: 114 10*3/uL — ABNORMAL LOW (ref 150–400)
RBC: 2.85 MIL/uL — ABNORMAL LOW (ref 4.22–5.81)
RDW: 15.3 % (ref 11.5–15.5)
WBC: 8.6 10*3/uL (ref 4.0–10.5)
nRBC: 0 % (ref 0.0–0.2)

## 2022-07-07 LAB — BASIC METABOLIC PANEL
Anion gap: 11 (ref 5–15)
BUN: 43 mg/dL — ABNORMAL HIGH (ref 8–23)
CO2: 22 mmol/L (ref 22–32)
Calcium: 9 mg/dL (ref 8.9–10.3)
Chloride: 104 mmol/L (ref 98–111)
Creatinine, Ser: 1.96 mg/dL — ABNORMAL HIGH (ref 0.61–1.24)
GFR, Estimated: 37 mL/min — ABNORMAL LOW (ref >60)
Glucose, Bld: 195 mg/dL — ABNORMAL HIGH (ref 70–99)
Potassium: 4.2 mmol/L (ref 3.5–5.1)
Sodium: 137 mmol/L (ref 135–145)

## 2022-07-07 LAB — SURGICAL PCR SCREEN
MRSA, PCR: NEGATIVE
Staphylococcus aureus: POSITIVE — AB

## 2022-07-07 MED ORDER — MUPIROCIN 2 % EX OINT
TOPICAL_OINTMENT | Freq: Two times a day (BID) | CUTANEOUS | Status: DC
  Filled 2022-07-07: qty 22

## 2022-07-07 MED ORDER — HYDRALAZINE HCL 25 MG PO TABS
25.0000 mg | ORAL_TABLET | Freq: Three times a day (TID) | ORAL | Status: DC
  Administered 2022-07-07 – 2022-07-10 (×7): 25 mg via ORAL
  Filled 2022-07-07 (×7): qty 1

## 2022-07-07 MED ORDER — VANCOMYCIN HCL 2000 MG/400ML IV SOLN
2000.0000 mg | Freq: Once | INTRAVENOUS | Status: AC
  Administered 2022-07-07: 2000 mg via INTRAVENOUS
  Filled 2022-07-07: qty 400

## 2022-07-07 MED ORDER — METHOCARBAMOL 500 MG PO TABS
750.0000 mg | ORAL_TABLET | Freq: Three times a day (TID) | ORAL | Status: DC
  Administered 2022-07-07 – 2022-07-10 (×8): 750 mg via ORAL
  Filled 2022-07-07 (×9): qty 2

## 2022-07-07 MED ORDER — VANCOMYCIN HCL IN DEXTROSE 1-5 GM/200ML-% IV SOLN
1000.0000 mg | INTRAVENOUS | Status: DC
  Filled 2022-07-07: qty 200

## 2022-07-07 MED ORDER — HYDROMORPHONE HCL 1 MG/ML IJ SOLN
0.5000 mg | INTRAMUSCULAR | Status: DC | PRN
  Administered 2022-07-08 – 2022-07-09 (×3): 0.5 mg via INTRAVENOUS
  Filled 2022-07-07 (×4): qty 1

## 2022-07-07 NOTE — Plan of Care (Signed)
  Problem: Coping: Goal: Ability to adjust to condition or change in health will improve Outcome: Progressing   Problem: Fluid Volume: Goal: Ability to maintain a balanced intake and output will improve Outcome: Progressing   Problem: Health Behavior/Discharge Planning: Goal: Ability to identify and utilize available resources and services will improve Outcome: Progressing   Problem: Health Behavior/Discharge Planning: Goal: Ability to manage health-related needs will improve Outcome: Progressing   Problem: Metabolic: Goal: Ability to maintain appropriate glucose levels will improve Outcome: Progressing   Problem: Nutritional: Goal: Maintenance of adequate nutrition will improve Outcome: Progressing   Problem: Skin Integrity: Goal: Risk for impaired skin integrity will decrease Outcome: Progressing   Problem: Metabolic: Goal: Ability to maintain appropriate glucose levels will improve Outcome: Progressing   Problem: Nutritional: Goal: Maintenance of adequate nutrition will improve Outcome: Progressing   Problem: Skin Integrity: Goal: Risk for impaired skin integrity will decrease Outcome: Progressing   Problem: Tissue Perfusion: Goal: Adequacy of tissue perfusion will improve Outcome: Progressing   Problem: Education: Goal: Knowledge of General Education information will improve Description: Including pain rating scale, medication(s)/side effects and non-pharmacologic comfort measures Outcome: Progressing   Problem: Health Behavior/Discharge Planning: Goal: Ability to manage health-related needs will improve Outcome: Progressing   Problem: Clinical Measurements: Goal: Cardiovascular complication will be avoided Outcome: Progressing   Problem: Activity: Goal: Risk for activity intolerance will decrease Outcome: Progressing   Problem: Nutrition: Goal: Adequate nutrition will be maintained Outcome: Progressing   Problem: Coping: Goal: Level of anxiety  will decrease Outcome: Progressing   Problem: Skin Integrity: Goal: Risk for impaired skin integrity will decrease Outcome: Progressing

## 2022-07-07 NOTE — Progress Notes (Signed)
PROGRESS NOTE    Albert Figueroa  UTM:[id number] DOB: August 21, 1956 DOA: 07/05/2022 PCP: Dion Body, MD    Brief Narrative:  66 y.o. 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   Pt states that he fell 2 days ago after he tripped on the steps.  He states that he has neuropathy and chronic gait instability which caused his fall. He denies loss of consciousness to me.  He has history of chronic pain syndrome on oxycodone 20 mg every 4-6 hours daily as needed (maximum 5 tablets).  He states that after fall, his pain has significantly worsened.  He states that he has pain all over, the worst pain is in right wrist and right hand.  The right wrist and hand pain is constant, sharp, severe, intractable, nonradiating.  The right hand is mildly swelling with mild erythema on examination.  Patient also has moderate pain in the right shoulder.  Patient does not have cough, shortness breath, chest pain.  No nausea, vomiting, diarrhea or abdominal pain.  Denies symptoms of UTI.   Patient was given multiple doses of pain medication in the ED, including 2 mg Dilaudid, 20 mg of oxycodone, 5 mg oxycodone again due to uncontrolled pain. When I saw pt in ED, he is very lethargic, but arousable, moves all extremities, no facial droop or slurred speech. Per report, pt had temperature 100.3 earlier, currently body temperature 98 when saw patient in ED.  Mental status improved the following morning.  Patient swallowing ability back to baseline.  Mentation back to baseline.  Endorses significant pain in right hand associated with swelling and decreased range of motion.  CRP markedly elevated at 20.9.  10/8: Mental status improved, approaching baseline.  However patient could not  tolerate MRI right hand or wrist due to pain.  Assessment & Plan:   Principal Problem:   Intractable pain Active Problems:   Chronic pain syndrome   CKD (chronic kidney disease) stage 4, GFR 15-29 ml/min (HCC)   Essential hypertension   Fall at home, initial encounter   Thrombocytopenia ([place])   Tobacco use   Type II diabetes mellitus with renal manifestations (HCC)   Neuropathy   Acute metabolic encephalopathy   Iron deficiency anemia   COPD (chronic obstructive pulmonary disease) (HCC)   Cellulitis of right hand   Over weight  * Intractable pain Patient has pain all over, but was down right wrist in the right hand.  X-ray negative for right wrist, right hand and right shoulder.  His right hand seems to have cellulitis.  Patient received multiple dose of pain medication, he becomes lethargic. Mental status back to baseline 10/7 Could not tolerate MRI due to pain Plan: Augment multimodal oral pain regimen Check labs Consider CT right wrist and hand  Cellulitis of right hand Patient has swelling, mild erythema and tenderness in the right hand dorsal aspect, indicating possible cellulitis.  Patient does not meets criteria for sepsis. Inflammatory markers elevated Could not tolerate MRI as above Plan: Consider CT right hand and wrist  Chronic pain syndrome - See above   CKD (chronic kidney disease) stage 4, GFR 15-29 ml/min (HCC) Renal function close to baseline.  Recent baseline creatinine 2.5-2.6.  His creatinine is at 2.74, BUN 42. -Avoid nonessential nephrotoxins Daily BMP-   Essential hypertension - IV  hydralazine as needed -Zestoretic   Fall at home, initial encounter Imaging negative for acute fracture or dislocation. - Fall precaution -PT/OT   Thrombocytopenia ([place]) Platelet 115 (226 on 03/19/2022), unclear etiology.   May be due to ongoing infection of right hand cellulitis -Monitor with daily CBC   Tobacco use - Nicotine patch   Type II diabetes  mellitus with renal manifestations (HCC) Recent A1c 6.7, well controlled.  Patient is taking Ozempic, Humalog, Farxiga -Sliding scale insulin   Neuropathy -3 times daily Neurontin   Acute metabolic encephalopathy Etiology is not clear.  CT head negative.  Possibly due to narcotic use.  Other differential diagnosis include delirium, ongoing infection. -Frequent neurochecks   Iron deficiency anemia Hemoglobin stable, 10.4 today (10.4 on 03/19/2022). -Follow-up with CBC   COPD (chronic obstructive pulmonary disease) (HCC) - Stable -Bronchodilators   Over weight BMI 25.10, body weight 81.6 kg. -Diet and exercise.   -Encourage to lose weight.  DVT prophylaxis: SQ Lovenox Code Status: Full Family Communication: None today.  Offered to call but patient declined Disposition Plan: Status is: Inpatient Remains inpatient appropriate because: Cellulitis right hand.  Will need advanced imaging modality.  Unable to tolerate MRI.  Possible discharge in 24 to 48 hours.     Level of care: Med-Surg  Consultants:  None  Procedures:  None  Antimicrobials: Ceftriaxone*  Subjective: Seen and examined.  Sitting comfortably in bed.  Mentating clearly.  No confusion or lethargy.  Endorses severe pain in right wrist and hand.  Decreased ROM.  Objective: Vitals:   07/06/22 1557 07/06/22 2025 07/06/22 2354 07/07/22 0751  BP: 128/68  (!) 94/54 133/64  Pulse: 78  88 97  Resp: 17  20 16   Temp: (!) 97.5 F (36.4 C)  98.9 F (37.2 C) 98.6 F (37 C)  TempSrc:    Oral  SpO2: 99% 97% 98% 100%  Weight:      Height:        Intake/Output Summary (Last 24 hours) at 07/07/2022 1000 Last data filed at 07/07/2022 0443 Gross per 24 hour  Intake 1365.67 ml  Output 1650 ml  Net -284.33 ml   Filed Weights   07/05/22 0124  Weight: 81.6 kg    Examination:  General exam: Appears uncomfortable due to pain Respiratory system: Clear to auscultation. Respiratory effort normal. Cardiovascular  system: S1-S2, RRR, no murmurs, no pedal edema Gastrointestinal system: Soft, NT/ND, normal bowel sounds Central nervous system: Alert and oriented. No focal neurological deficits. Extremities: Right hand and wrist swollen, tender to touch, decreased ROM, decreased grip strength Skin: No rashes, lesions or ulcers Psychiatry: Judgement and insight appear normal. Mood & affect appropriate.     Data Reviewed: I have personally reviewed following labs and imaging studies  CBC: Recent Labs  Lab 07/05/22 0216  WBC 6.3  6.2  NEUTROABS 4.1  HGB 10.3*  10.4*  HCT 33.9*  34.8*  MCV 103.4*  103.3*  PLT 118*  193*   Basic Metabolic Panel: Recent Labs  Lab 07/05/22 0216 07/06/22 0412  NA 141 144  K 4.1 4.1  CL 108 110  CO2 26 25  GLUCOSE 166* 162*  BUN 42* 35*  CREATININE 2.74* 2.03*  CALCIUM 8.8* 9.1   GFR: Estimated Creatinine Clearance: 38.6 mL/min (A) (by C-G formula based on SCr of 2.03 mg/dL (H)). Liver Function Tests: Recent Labs  Lab 07/05/22 0215  AST 27  ALT 12  ALKPHOS 71  BILITOT 0.9  PROT 8.1  ALBUMIN 3.3*  No results for input(s): "LIPASE", "AMYLASE" in the last 168 hours. No results for input(s): "AMMONIA" in the last 168 hours. Coagulation Profile: No results for input(s): "INR", "PROTIME" in the last 168 hours. Cardiac Enzymes: Recent Labs  Lab 07/05/22 0215  CKTOTAL 47*   BNP (last 3 results) No results for input(s): "PROBNP" in the last 8760 hours. HbA1C: No results for input(s): "HGBA1C" in the last 72 hours. CBG: Recent Labs  Lab 07/06/22 0031 07/06/22 0858 07/06/22 1150 07/06/22 1624 07/07/22 0748  GLUCAP 177* 168* 212* 170* 146*   Lipid Profile: No results for input(s): "CHOL", "HDL", "LDLCALC", "TRIG", "CHOLHDL", "LDLDIRECT" in the last 72 hours. Thyroid Function Tests: No results for input(s): "TSH", "T4TOTAL", "FREET4", "T3FREE", "THYROIDAB" in the last 72 hours. Anemia Panel: Recent Labs    07/05/22 1717  VITAMINB12  574   Sepsis Labs: Recent Labs  Lab 07/05/22 1500 07/05/22 1717  PROCALCITON  --  0.23  LATICACIDVEN 1.1 1.8    Recent Results (from the past 240 hour(s))  SARS Coronavirus 2 by RT PCR (hospital order, performed in [facility] hospital lab) *cepheid single result test* Anterior Nasal Swab     Status: None   Collection Time: 07/05/22  3:17 AM   Specimen: Anterior Nasal Swab  Result Value Ref Range Status   SARS Coronavirus 2 by RT PCR NEGATIVE NEGATIVE Final    Comment: (NOTE) SARS-CoV-2 target nucleic acids are NOT DETECTED.  The SARS-CoV-2 RNA is generally detectable in upper and lower respiratory specimens during the acute phase of infection. The lowest concentration of SARS-CoV-2 viral copies this assay can detect is 250 copies / mL. A negative result does not preclude SARS-CoV-2 infection and should not be used as the sole basis for treatment or other patient management decisions.  A negative result may occur with improper specimen collection / handling, submission of specimen other than nasopharyngeal swab, presence of viral mutation(s) within the areas targeted by this assay, and inadequate number of viral copies (<250 copies / mL). A negative result must be combined with clinical observations, patient history, and epidemiological information.  Fact Sheet for Patients:   https://www.patel.info/  Fact Sheet for Healthcare Providers: https://hall.com/  This test is not yet approved or  cleared by the [place] FDA and has been authorized for detection and/or diagnosis of SARS-CoV-2 by FDA under an Emergency Use Authorization (EUA).  This EUA will remain in effect (meaning this test can be used) for the duration of the COVID-19 declaration under Section 564(b)(1) of the Act, 21 U.S.C. section 360bbb-3(b)(1), unless the authorization is terminated or revoked sooner.  Performed at [facility], [address] [place], [place] [address]   Culture, blood (Routine X 2) w Reflex to ID Panel     Status: None (Preliminary result)   Collection Time: 07/05/22  2:19 PM   Specimen: BLOOD  Result Value Ref Range Status   Specimen Description BLOOD BLOOD RIGHT ARM  Final   Special Requests   Final    BOTTLES DRAWN AEROBIC AND ANAEROBIC Blood Culture adequate volume   Culture   Final    NO GROWTH 2 DAYS Performed at [facility], [address], [place], [place] [address]    Report Status PENDING  Incomplete  Culture, blood (Routine X 2) w Reflex to ID Panel     Status: None (Preliminary result)   Collection Time: 07/05/22  2:24 PM   Specimen: BLOOD  Result Value Ref Range Status   Specimen Description  BLOOD BLOOD LEFT ARM  Final   Special Requests   Final    BOTTLES DRAWN AEROBIC AND ANAEROBIC Blood Culture adequate volume   Culture   Final    NO GROWTH 2 DAYS Performed at [facility], [address], [place], [place] [address]    Report Status PENDING  Incomplete         Radiology Studies: DG Shoulder Right  Result Date: 07/05/2022 CLINICAL DATA:  RIGHT shoulder pain after falling 2 days ago. EXAM: RIGHT SHOULDER - 2+ VIEW COMPARISON:  MRI of the shoulder from 2021 FINDINGS: Humeral head is located. Glenohumeral degenerative changes. Scapula is intact to the extent evaluated. Shunt tubing is incidentally noted crossing the field of view with extensive calcification along its course in the neck and chest wall. Soft tissues are otherwise unremarkable. IMPRESSION: 1. No acute fracture or dislocation. 2. Glenohumeral degenerative changes. 3. Shunt tubing with extensive calcification along its course in the neck and chest wall. Correlate with any signs of shunt malfunction. Electronically Signed   By: Zetta Bills M.D.   On: 07/05/2022 15:02        Scheduled Meds:  enoxaparin (LOVENOX) injection  40 mg Subcutaneous Q24H   fenofibrate  160 mg Oral Daily   ferrous sulfate   325 mg Oral Q breakfast   fluticasone furoate-vilanterol  1 puff Inhalation Daily   And   umeclidinium bromide  1 puff Inhalation Daily   hydrALAZINE  25 mg Oral Q8H   insulin aspart  0-5 Units Subcutaneous QHS   insulin aspart  0-9 Units Subcutaneous TID WC   ipratropium-albuterol  3 mL Inhalation TID   ketotifen  1 drop Both Eyes BID   lidocaine  1 patch Transdermal Q24H   loteprednol  1 drop Both Eyes QID   And   tobramycin  1 drop Both Eyes QID   methocarbamol  750 mg Oral TID   nicotine  21 mg Transdermal Daily   pantoprazole  40 mg Oral Daily   roflumilast  250 mcg Oral QHS   rosuvastatin  20 mg Oral QHS   tiZANidine  4 mg Oral QHS   Continuous Infusions:  sodium chloride 75 mL/hr at 07/07/22 0509   cefTRIAXone (ROCEPHIN)  IV Stopped (07/06/22 1456)   valproate sodium Stopped (07/07/22 0046)   vancomycin       LOS: 1 day   Sidney Ace, MD [facility]   If 7PM-7AM, please contact night-coverage  07/07/2022, 10:00 AM

## 2022-07-07 NOTE — Consult Note (Signed)
Pharmacy Antibiotic Note  Albert Figueroa is a 66 y.o. male with PMH including chronic pain syndrome, HTN, HLD, DM, COPD, seizures, CKD, lumbar stenosis, tobacco use disorder, anemia, s/p VP shunt s/t meningitis admitted on 07/05/2022 with  intractable pain after fall and right hand cellulitis .  Pharmacy has been consulted for vancomycin dosing. Patient is also ordered ceftriaxone  Plan:  Vancomycin 2 g IV LD followed by vancomycin 1 g IV q24h --Calculated AUC: 425, Cmin 12.1 --Daily Scr per protocol --Levels at steady state or as clinically indicated  Height: 5\' 11"  (180.3 cm) Weight: 81.6 kg (180 lb) IBW/kg (Calculated) : 75.3  Temp (24hrs), Avg:98.3 F (36.8 C), Min:97.5 F (36.4 C), Max:98.9 F (37.2 C)  Recent Labs  Lab 07/05/22 0216 07/05/22 1500 07/05/22 1717 07/06/22 0412  WBC 6.3  6.2  --   --   --   CREATININE 2.74*  --   --  2.03*  LATICACIDVEN  --  1.1 1.8  --     Estimated Creatinine Clearance: 38.6 mL/min (A) (by C-G formula based on SCr of 2.03 mg/dL (H)).    Allergies  Allergen Reactions   Gabapentin Other (See Comments)    Unknown reaction (Per Significant Other)   Amlodipine     Other reaction(s): Other (see comments) Caused elevated blood pressure and headaches    Antimicrobials this admission: Ceftriaxone 10/6 >>  Vancomycin 10/8 >>   Dose adjustments this admission: N/A  Microbiology results: 10/6 BCx: NGTD  Thank you for allowing pharmacy to be a part of this patient's care.  Benita Gutter 07/07/2022 9:48 AM

## 2022-07-08 ENCOUNTER — Inpatient Hospital Stay: Payer: Medicare Other

## 2022-07-08 DIAGNOSIS — R52 Pain, unspecified: Secondary | ICD-10-CM | POA: Diagnosis not present

## 2022-07-08 LAB — BASIC METABOLIC PANEL
Anion gap: 9 (ref 5–15)
BUN: 57 mg/dL — ABNORMAL HIGH (ref 8–23)
CO2: 22 mmol/L (ref 22–32)
Calcium: 8.8 mg/dL — ABNORMAL LOW (ref 8.9–10.3)
Chloride: 111 mmol/L (ref 98–111)
Creatinine, Ser: 2.4 mg/dL — ABNORMAL HIGH (ref 0.61–1.24)
GFR, Estimated: 29 mL/min — ABNORMAL LOW (ref >60)
Glucose, Bld: 146 mg/dL — ABNORMAL HIGH (ref 70–99)
Potassium: 3.6 mmol/L (ref 3.5–5.1)
Sodium: 142 mmol/L (ref 135–145)

## 2022-07-08 LAB — CBC WITH DIFFERENTIAL/PLATELET
Abs Immature Granulocytes: 0.05 10*3/uL (ref 0.00–0.07)
Basophils Absolute: 0 10*3/uL (ref 0.0–0.1)
Basophils Relative: 0 %
Eosinophils Absolute: 0 10*3/uL (ref 0.0–0.5)
Eosinophils Relative: 0 %
HCT: 25.9 % — ABNORMAL LOW (ref 39.0–52.0)
Hemoglobin: 7.9 g/dL — ABNORMAL LOW (ref 13.0–17.0)
Immature Granulocytes: 1 %
Lymphocytes Relative: 13 %
Lymphs Abs: 0.9 10*3/uL (ref 0.7–4.0)
MCH: 31 pg (ref 26.0–34.0)
MCHC: 30.5 g/dL (ref 30.0–36.0)
MCV: 101.6 fL — ABNORMAL HIGH (ref 80.0–100.0)
Monocytes Absolute: 0.9 10*3/uL (ref 0.1–1.0)
Monocytes Relative: 14 %
Neutro Abs: 4.8 10*3/uL (ref 1.7–7.7)
Neutrophils Relative %: 72 %
Platelets: 113 10*3/uL — ABNORMAL LOW (ref 150–400)
RBC: 2.55 MIL/uL — ABNORMAL LOW (ref 4.22–5.81)
RDW: 15.7 % — ABNORMAL HIGH (ref 11.5–15.5)
WBC: 6.7 10*3/uL (ref 4.0–10.5)
nRBC: 0 % (ref 0.0–0.2)

## 2022-07-08 LAB — GLUCOSE, CAPILLARY
Glucose-Capillary: 148 mg/dL — ABNORMAL HIGH (ref 70–99)
Glucose-Capillary: 164 mg/dL — ABNORMAL HIGH (ref 70–99)
Glucose-Capillary: 185 mg/dL — ABNORMAL HIGH (ref 70–99)

## 2022-07-08 LAB — URINALYSIS, COMPLETE (UACMP) WITH MICROSCOPIC
Bacteria, UA: NONE SEEN
Bilirubin Urine: NEGATIVE
Glucose, UA: 50 mg/dL — AB
Hgb urine dipstick: NEGATIVE
Ketones, ur: NEGATIVE mg/dL
Leukocytes,Ua: NEGATIVE
Nitrite: NEGATIVE
Protein, ur: NEGATIVE mg/dL
Specific Gravity, Urine: 1.024 (ref 1.005–1.030)
pH: 5 (ref 5.0–8.0)

## 2022-07-08 LAB — CREATININE, SERUM
Creatinine, Ser: 2.5 mg/dL — ABNORMAL HIGH (ref 0.61–1.24)
GFR, Estimated: 28 mL/min — ABNORMAL LOW (ref >60)

## 2022-07-08 MED ORDER — CEPHALEXIN 500 MG PO CAPS
500.0000 mg | ORAL_CAPSULE | Freq: Four times a day (QID) | ORAL | Status: DC
  Administered 2022-07-08 – 2022-07-10 (×9): 500 mg via ORAL
  Filled 2022-07-08 (×9): qty 1

## 2022-07-08 NOTE — Progress Notes (Signed)
Physical Therapy Treatment Patient Details Name: Albert Figueroa MRN: [id number] DOB: Dec 12, 1955 Today's Date: 07/08/2022   History of Present Illness Patient is a 66 year old male who presents s/p fall on Tuesday causing him to [facility] on his R hand. Since then has had diffuse and worsening pain in body. 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 Per patient's daughter patient has had 3 falls in past 5 days with worsening cognition in past five days.    PT Comments    Patient alert, sitting EOB with 2 CNAs, exhibited L lateral lean throughout, at least minA to maintain sitting. Answers to name but displayed decreased deficits and safety awareness with mobility, some conversation tangential. Sit <> stand with RW and platform attempted twice, maxA-totalAx3, unable to get up into fully standing despite cueing and time. Squat pivot to [facility] with maxAx2, 3rd person stabilization of BSC. On BSC with BUE propping pt able to maintain balance and have BM. Returned to bed maxA2-3 and pericare total A in sidelying. Pt with CNAs at end of session in prep for a bed bath. The patient would benefit from further skilled PT intervention to continue to progress towards goals as able. Recommendation remains appropriate.     Recommendations for follow up therapy are one component of a multi-disciplinary discharge planning process, led by the attending physician.  Recommendations may be updated based on patient status, additional functional criteria and insurance authorization.  Follow Up Recommendations  Skilled nursing-short term rehab (<3 hours/day) Can patient physically be transported by private vehicle: No   Assistance Recommended at Discharge Frequent or constant Supervision/Assistance  Patient can return home with the following Two people to help with walking and/or transfers;Two people to help with  bathing/dressing/bathroom;Assistance with cooking/housework;Direct supervision/assist for medications management;Direct supervision/assist for financial management;Assist for transportation;Help with stairs or ramp for entrance   Equipment Recommendations  Other (comment) (TBD at next level of care)    Recommendations for Other Services       Precautions / Restrictions Precautions Precautions: Fall Other Brace: R wrist brace not donned Restrictions Weight Bearing Restrictions: No     Mobility  Bed Mobility Overal bed mobility: Needs Assistance Bed Mobility: Sit to Supine       Sit to supine: Max assist, +2 for safety/equipment        Transfers Overall transfer level: Needs assistance Equipment used: Rolling walker (2 wheels) Transfers: Sit to/from Stand, Bed to chair/wheelchair/BSC Sit to Stand: Total assist, +2 physical assistance     Squat pivot transfers: Max assist, +2 physical assistance     General transfer comment: 3rd person to stabilize commode    Ambulation/Gait                   Stairs             Wheelchair Mobility    Modified Rankin (Stroke Patients Only)       Balance Overall balance assessment: Needs assistance Sitting-balance support: Feet supported Sitting balance-Leahy Scale: Poor Sitting balance - Comments: CGA-MIN A required at all times for static sitting balance, L lateral lean noted     Standing balance-Leahy Scale: Zero                              Cognition Arousal/Alertness: Awake/alert   Overall Cognitive Status: No family/caregiver present to determine baseline cognitive functioning  General Comments: oriented to self only, displayed confusion and lack of deficit awareness/safety awareness throughout session        Exercises      General Comments        Pertinent Vitals/Pain Pain Assessment Pain Assessment: Faces Faces Pain Scale: Hurts  whole lot Pain Location: R wrist, body Pain Descriptors / Indicators: Grimacing, Guarding, Moaning Pain Intervention(s): Repositioned, Monitored during session, Limited activity within patient's tolerance    Home Living                          Prior Function            PT Goals (current goals can now be found in the care plan section) Progress towards PT goals: Progressing toward goals    Frequency    Min 2X/week      PT Plan Current plan remains appropriate    Co-evaluation              AM-PAC PT "6 Clicks" Mobility   Outcome Measure  Help needed turning from your back to your side while in a flat bed without using bedrails?: A Lot Help needed moving from lying on your back to sitting on the side of a flat bed without using bedrails?: A Lot Help needed moving to and from a bed to a chair (including a wheelchair)?: Total Help needed standing up from a chair using your arms (e.g., wheelchair or bedside chair)?: Total Help needed to walk in hospital room?: Total Help needed climbing 3-5 steps with a railing? : Total 6 Click Score: 8    End of Session Equipment Utilized During Treatment: Gait belt Activity Tolerance: Patient limited by pain;Other (comment) (limited by cognition) Patient left: in bed;Other (comment) (with OT) Nurse Communication: Mobility status PT Visit Diagnosis: Unsteadiness on feet (R26.81);Other abnormalities of gait and mobility (R26.89);Repeated falls (R29.6);Muscle weakness (generalized) (M62.81);History of falling (Z91.81);Difficulty in walking, not elsewhere classified (R26.2);Pain Pain - Right/Left: Right Pain - part of body: Arm     Time: 6203-5597 PT Time Calculation (min) (ACUTE ONLY): 14 min  Charges:  $Therapeutic Activity: 8-22 mins                     Lieutenant Diego PT, DPT 10:21 AM,07/08/22

## 2022-07-08 NOTE — Plan of Care (Signed)
  Problem: Fluid Volume: Goal: Ability to maintain a balanced intake and output will improve Outcome: Progressing   Problem: Metabolic: Goal: Ability to maintain appropriate glucose levels will improve Outcome: Progressing   Problem: Nutritional: Goal: Maintenance of adequate nutrition will improve Outcome: Progressing   Problem: Skin Integrity: Goal: Risk for impaired skin integrity will decrease Outcome: Progressing   Problem: Tissue Perfusion: Goal: Adequacy of tissue perfusion will improve Outcome: Progressing   Problem: Health Behavior/Discharge Planning: Goal: Ability to manage health-related needs will improve Outcome: Progressing   Problem: Clinical Measurements: Goal: Respiratory complications will improve Outcome: Progressing   Problem: Nutrition: Goal: Adequate nutrition will be maintained Outcome: Progressing   Problem: Coping: Goal: Level of anxiety will decrease Outcome: Progressing   Problem: Safety: Goal: Ability to remain free from injury will improve Outcome: Progressing   Problem: Skin Integrity: Goal: Risk for impaired skin integrity will decrease Outcome: Progressing

## 2022-07-08 NOTE — Plan of Care (Signed)

## 2022-07-08 NOTE — TOC Progression Note (Signed)
Transition of Care [facility]) - Progression Note    Patient Details  Name: Albert Figueroa MRN: [id number] Date of Birth: [birth year]  Transition of Care [facility]) CM/SW [place], [place] Phone Number: 07/08/2022, 4:11 PM  Clinical Narrative:     This patient has been accepted to [facility] in [place], [place]. He is interested in a facility close to [place], [place]. This SNF is 20 minutes from [facility]. He will need a PASSR and [facility] authorization.  Expected Discharge Plan: [place] Barriers to Discharge: Continued Medical Work up  Expected Discharge Plan and Services Expected Discharge Plan: [place] arrangements for the past 2 months: Single Family Home                                       Social Determinants of Health (SDOH) Interventions    Readmission Risk Interventions     No data to display

## 2022-07-08 NOTE — Progress Notes (Signed)
PROGRESS NOTE    Albert Figueroa  FXT:[id number] DOB: 1956/03/01 DOA: 07/05/2022 PCP: Dion Body, MD    Brief Narrative:  66 y.o. 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   Pt states that he fell 2 days ago after he tripped on the steps.  He states that he has neuropathy and chronic gait instability which caused his fall. He denies loss of consciousness to me.  He has history of chronic pain syndrome on oxycodone 20 mg every 4-6 hours daily as needed (maximum 5 tablets).  He states that after fall, his pain has significantly worsened.  He states that he has pain all over, the worst pain is in right wrist and right hand.  The right wrist and hand pain is constant, sharp, severe, intractable, nonradiating.  The right hand is mildly swelling with mild erythema on examination.  Patient also has moderate pain in the right shoulder.  Patient does not have cough, shortness breath, chest pain.  No nausea, vomiting, diarrhea or abdominal pain.  Denies symptoms of UTI.   Patient was given multiple doses of pain medication in the ED, including 2 mg Dilaudid, 20 mg of oxycodone, 5 mg oxycodone again due to uncontrolled pain. When I saw pt in ED, he is very lethargic, but arousable, moves all extremities, no facial droop or slurred speech. Per report, pt had temperature 100.3 earlier, currently body temperature 98 when saw patient in ED.  Mental status improved the following morning.  Patient swallowing ability back to baseline.  Mentation back to baseline.  Endorses significant pain in right hand associated with swelling and decreased range of motion.  CRP markedly elevated at 20.9.  10/8: Mental status improved, approaching baseline.  However patient could not  tolerate MRI right hand or wrist due to pain.  10/9: Patient is clinically improving.  Continued pain and swelling in the right hand and wrist.  Able to acquire CT right wrist and hand without contrast.  Nonspecific soft tissue swelling with no evidence of abscess or osteomyelitis.  Study limited by lack of contrast however reassuring nonetheless.  Patient expresses strong desire to go home.  He is nearly max assist and can barely ambulate.  His ability to be safe at home is greatly in question.  I did discuss this at length with the patient's daughter via phone.  Apparently patient was recommended skilled nursing facility in the past but had refused.  He has had repeat falls and home is likely not a safe disposition.  Daughter states she will come to the hospital and attempt to have patient agree to skilled nursing facility  Assessment & Plan:   Principal Problem:   Intractable pain Active Problems:   Chronic pain syndrome   CKD (chronic kidney disease) stage 4, GFR 15-29 ml/min (HCC)   Essential hypertension   Fall at home, initial encounter   Thrombocytopenia ([place])   Tobacco use   Type II diabetes mellitus with renal manifestations (HCC)   Neuropathy   Acute metabolic encephalopathy   Iron deficiency anemia   COPD (chronic obstructive pulmonary disease) (HCC)   Cellulitis of right hand   Over weight  * Intractable pain Patient has pain all over, but was down right wrist in the right hand.  X-ray negative for right wrist, right hand and right  shoulder.  His right hand seems to have cellulitis.  Patient received multiple dose of pain medication, he becomes lethargic. Mental status back to baseline 10/7 Could not tolerate MRI due to pain CT right wrist and hand with nonspecific edema Plan: Marcello Moores Multimodal oral pain regimen  Cellulitis of right hand Patient has swelling, mild erythema and tenderness in the right hand dorsal aspect, indicating possible cellulitis.  Patient does not  meets criteria for sepsis. Inflammatory markers elevated Could not tolerate MRI as above CT right hand and wrist negative for abscess or osteomyelitis (without contrast) Plan: Treat with p.o. Keflex via  Chronic pain syndrome - See above   CKD (chronic kidney disease) stage 4, GFR 15-29 ml/min (HCC) Renal function close to baseline.  Recent baseline creatinine 2.5-2.6.  His creatinine is at 2.74, BUN 42. -Avoid nonessential nephrotoxins Daily BMP-   Essential hypertension - IV hydralazine as needed -Zestoretic   Fall at home, initial encounter Imaging negative for acute fracture or dislocation. - Fall precaution -PT/OT   Thrombocytopenia (HCC) Platelet 115 (226 on 03/19/2022), unclear etiology.   May be due to ongoing infection of right hand cellulitis -Monitor with daily CBC   Tobacco use - Nicotine patch   Type II diabetes mellitus with renal manifestations (HCC) Recent A1c 6.7, well controlled.  Patient is taking Ozempic, Humalog, Farxiga -Sliding scale insulin   Neuropathy -3 times daily Neurontin   Acute metabolic encephalopathy Etiology is not clear.  CT head negative.  Possibly due to narcotic use.  Other differential diagnosis include delirium, ongoing infection. -Frequent neurochecks   Iron deficiency anemia Hemoglobin stable, 10.4 today (10.4 on 03/19/2022). -Follow-up with CBC   COPD (chronic obstructive pulmonary disease) (HCC) - Stable -Bronchodilators   Over weight BMI 25.10, body weight 81.6 kg. -Diet and exercise.   -Encourage to lose weight.  DVT prophylaxis: SQ Lovenox Code Status: Full Family Communication: Daughter Weyman Croon [phone] on 10/9 Disposition Plan: Status is: Inpatient Remains inpatient appropriate because: Cellulitis right hand.  Approaching medical readiness for discharge however lacking safe disposition plan at this time.  Home versus SNF     Level of care: Med-Surg  Consultants:  None  Procedures:   None  Antimicrobials: Keflex  Subjective: Seen and examined.  Sitting comfortably in bed.  Mentating clearly.  No confusion or lethargy.  Endorses severe pain in right wrist and hand.  Decreased ROM.  Objective: Vitals:   07/07/22 1835 07/07/22 2217 07/08/22 0805 07/08/22 0910  BP:  115/74 99/63 106/67  Pulse:  95 88 83  Resp:  20 16   Temp: 98.9 F (37.2 C) 97.9 F (36.6 C) 98 F (36.7 C)   TempSrc: Oral     SpO2:  95% 95% 96%  Weight:      Height:        Intake/Output Summary (Last 24 hours) at 07/08/2022 1404 Last data filed at 07/08/2022 0422 Gross per 24 hour  Intake 0 ml  Output 800 ml  Net -800 ml   Filed Weights   07/05/22 0124  Weight: 81.6 kg    Examination:  General exam: NAD.  Appears fatigued Respiratory system: Clear to auscultation. Respiratory effort normal. Cardiovascular system: S1-S2, RRR, no murmurs, no pedal edema Gastrointestinal system: Soft, NT/ND, normal bowel sounds Central nervous system: Alert and oriented. No focal neurological deficits. Extremities: Right hand and wrist swollen, tender to touch, decreased ROM, decreased grip strength.  Improved from prior Skin: No rashes, lesions or ulcers Psychiatry: Judgement and insight appear normal.  Mood & affect appropriate.     Data Reviewed: I have personally reviewed following labs and imaging studies  CBC: Recent Labs  Lab 07/05/22 0216 07/07/22 0952 07/08/22 0750  WBC 6.3  6.2 8.6 6.7  NEUTROABS 4.1 6.3 4.8  HGB 10.3*  10.4* 8.9* 7.9*  HCT 33.9*  34.8* 28.6* 25.9*  MCV 103.4*  103.3* 100.4* 101.6*  PLT 118*  115* 114* 831*   Basic Metabolic Panel: Recent Labs  Lab 07/05/22 0216 07/06/22 0412 07/07/22 0952 07/08/22 0410 07/08/22 0750  NA 141 144 137  --  142  K 4.1 4.1 4.2  --  3.6  CL 108 110 104  --  111  CO2 26 25 22   --  22  GLUCOSE 166* 162* 195*  --  146*  BUN 42* 35* 43*  --  57*  CREATININE 2.74* 2.03* 1.96* 2.50* 2.40*  CALCIUM 8.8* 9.1 9.0  --  8.8*    GFR: Estimated Creatinine Clearance: 32.7 mL/min (A) (by C-G formula based on SCr of 2.4 mg/dL (H)). Liver Function Tests: Recent Labs  Lab 07/05/22 0215  AST 27  ALT 12  ALKPHOS 71  BILITOT 0.9  PROT 8.1  ALBUMIN 3.3*   No results for input(s): "LIPASE", "AMYLASE" in the last 168 hours. No results for input(s): "AMMONIA" in the last 168 hours. Coagulation Profile: No results for input(s): "INR", "PROTIME" in the last 168 hours. Cardiac Enzymes: Recent Labs  Lab 07/05/22 0215  CKTOTAL 47*   BNP (last 3 results) No results for input(s): "PROBNP" in the last 8760 hours. HbA1C: No results for input(s): "HGBA1C" in the last 72 hours. CBG: Recent Labs  Lab 07/07/22 1208 07/07/22 1726 07/07/22 2024 07/08/22 0806 07/08/22 1149  GLUCAP 169* 155* 145* 148* 164*   Lipid Profile: No results for input(s): "CHOL", "HDL", "LDLCALC", "TRIG", "CHOLHDL", "LDLDIRECT" in the last 72 hours. Thyroid Function Tests: No results for input(s): "TSH", "T4TOTAL", "FREET4", "T3FREE", "THYROIDAB" in the last 72 hours. Anemia Panel: Recent Labs    07/05/22 1717  VITAMINB12 574   Sepsis Labs: Recent Labs  Lab 07/05/22 1500 07/05/22 1717  PROCALCITON  --  0.23  LATICACIDVEN 1.1 1.8    Recent Results (from the past 240 hour(s))  SARS Coronavirus 2 by RT PCR (hospital order, performed in [facility] hospital lab) *cepheid single result test* Anterior Nasal Swab     Status: None   Collection Time: 07/05/22  3:17 AM   Specimen: Anterior Nasal Swab  Result Value Ref Range Status   SARS Coronavirus 2 by RT PCR NEGATIVE NEGATIVE Final    Comment: (NOTE) SARS-CoV-2 target nucleic acids are NOT DETECTED.  The SARS-CoV-2 RNA is generally detectable in upper and lower respiratory specimens during the acute phase of infection. The lowest concentration of SARS-CoV-2 viral copies this assay can detect is 250 copies / mL. A negative result does not preclude SARS-CoV-2 infection and should  not be used as the sole basis for treatment or other patient management decisions.  A negative result may occur with improper specimen collection / handling, submission of specimen other than nasopharyngeal swab, presence of viral mutation(s) within the areas targeted by this assay, and inadequate number of viral copies (<250 copies / mL). A negative result must be combined with clinical observations, patient history, and epidemiological information.  Fact Sheet for Patients:   https://www.patel.info/  Fact Sheet for Healthcare Providers: https://hall.com/  This test is not yet approved or  cleared by the [place] FDA and has  been authorized for detection and/or diagnosis of SARS-CoV-2 by FDA under an Emergency Use Authorization (EUA).  This EUA will remain in effect (meaning this test can be used) for the duration of the COVID-19 declaration under Section 564(b)(1) of the Act, 21 U.S.C. section 360bbb-3(b)(1), unless the authorization is terminated or revoked sooner.  Performed at [facility], [address] [place], [place] [address]   Culture, blood (Routine X 2) w Reflex to ID Panel     Status: None (Preliminary result)   Collection Time: 07/05/22  2:19 PM   Specimen: BLOOD  Result Value Ref Range Status   Specimen Description BLOOD BLOOD RIGHT ARM  Final   Special Requests   Final    BOTTLES DRAWN AEROBIC AND ANAEROBIC Blood Culture adequate volume   Culture   Final    NO GROWTH 3 DAYS Performed at [facility], [address] [place], [place] [address]    Report Status PENDING  Incomplete  Culture, blood (Routine X 2) w Reflex to ID Panel     Status: None (Preliminary result)   Collection Time: 07/05/22  2:24 PM   Specimen: BLOOD  Result Value Ref Range Status   Specimen Description BLOOD BLOOD LEFT ARM  Final   Special Requests   Final    BOTTLES DRAWN AEROBIC AND ANAEROBIC Blood Culture adequate  volume   Culture   Final    NO GROWTH 3 DAYS Performed at [facility], [address] [place], [place] [address]    Report Status PENDING  Incomplete  Surgical pcr screen     Status: Abnormal   Collection Time: 07/07/22 12:08 PM   Specimen: Nasal Mucosa; Nasal Swab  Result Value Ref Range Status   MRSA, PCR NEGATIVE NEGATIVE Final   Staphylococcus aureus POSITIVE (A) NEGATIVE Final    Comment: (NOTE) The Xpert SA Assay (FDA approved for NASAL specimens in patients 28 years of age and older), is one component of a comprehensive surveillance program. It is not intended to diagnose infection nor to guide or monitor treatment. Performed at [facility], [address] [place], [place] [address]          Radiology Studies: CT HAND RIGHT WO CONTRAST  Result Date: 07/07/2022 CLINICAL DATA:  Soft tissue infection suspected within the right wrist and hand. Unable to tolerate MRI. Chronic kidney disease. EXAM: CT OF THE RIGHT HAND WITHOUT CONTRAST; CT OF THE RIGHT WRIST WITHOUT CONTRAST TECHNIQUE: Multidetector CT imaging of the right wrist and hand was performed according to the standard protocol. Multiplanar CT image reconstructions were also generated. RADIATION DOSE REDUCTION: This exam was performed according to the departmental dose-optimization program which includes automated exposure control, adjustment of the mA and/or kV according to patient size and/or use of iterative reconstruction technique. COMPARISON:  Radiographs 07/05/2022. FINDINGS: Bones/Joint/Cartilage Images extend from the mid forearm through the fingers of the right hand. There is no evidence of acute fracture, dislocation or bone destruction. There are minor interphalangeal degenerative changes within the fingers. Mild diffuse intercarpal joint space narrowing with osteophytes at the 1st carpometacarpal and scaphotrapeziotrapezoidal joints. As noted radiographically, there is scapholunate diastasis  which is likely related to an old scapholunate ligament tear. There is radiocarpal joint space narrowing with fragmented spurring posteriorly. There are possible small radiocarpal and distal radioulnar joint effusions which are nonspecific. Ligaments Suboptimally assessed by CT. As above, scapholunate diastasis suggesting an old injury of the scapholunate ligament. Muscles and Tendons No focal intramuscular abnormalities are  identified. The wrist tendons appear grossly unremarkable. The finger tendons appear intact, without significant tenosynovitis. Soft tissues No focal soft tissue abnormalities are identified in the distal forearm. There is mild dorsal soft tissue swelling within the wrist and hand without apparent focal fluid collection. No evidence of foreign body or soft tissue emphysema. Scattered vascular calcifications are noted. IMPRESSION: 1. Nonspecific dorsal soft tissue swelling within the wrist and hand without apparent focal fluid collection. This could reflect superficial soft tissue infection (cellulitis). 2. No evidence of osteomyelitis or acute osseous findings. 3. Scattered degenerative changes, greatest at the scaphotrapeziotrapezoid, 1st carpometacarpal and radiocarpal joints with chondrocalcinosis. 4. Scapholunate diastasis suggesting an old scapholunate ligament tear. 5. Possible small radiocarpal and distal radioulnar joint effusions, nonspecific. Electronically Signed   By: Richardean Sale M.D.   On: 07/07/2022 16:49   CT WRIST RIGHT WO CONTRAST  Result Date: 07/07/2022 CLINICAL DATA:  Soft tissue infection suspected within the right wrist and hand. Unable to tolerate MRI. Chronic kidney disease. EXAM: CT OF THE RIGHT HAND WITHOUT CONTRAST; CT OF THE RIGHT WRIST WITHOUT CONTRAST TECHNIQUE: Multidetector CT imaging of the right wrist and hand was performed according to the standard protocol. Multiplanar CT image reconstructions were also generated. RADIATION DOSE REDUCTION: This exam  was performed according to the departmental dose-optimization program which includes automated exposure control, adjustment of the mA and/or kV according to patient size and/or use of iterative reconstruction technique. COMPARISON:  Radiographs 07/05/2022. FINDINGS: Bones/Joint/Cartilage Images extend from the mid forearm through the fingers of the right hand. There is no evidence of acute fracture, dislocation or bone destruction. There are minor interphalangeal degenerative changes within the fingers. Mild diffuse intercarpal joint space narrowing with osteophytes at the 1st carpometacarpal and scaphotrapeziotrapezoidal joints. As noted radiographically, there is scapholunate diastasis which is likely related to an old scapholunate ligament tear. There is radiocarpal joint space narrowing with fragmented spurring posteriorly. There are possible small radiocarpal and distal radioulnar joint effusions which are nonspecific. Ligaments Suboptimally assessed by CT. As above, scapholunate diastasis suggesting an old injury of the scapholunate ligament. Muscles and Tendons No focal intramuscular abnormalities are identified. The wrist tendons appear grossly unremarkable. The finger tendons appear intact, without significant tenosynovitis. Soft tissues No focal soft tissue abnormalities are identified in the distal forearm. There is mild dorsal soft tissue swelling within the wrist and hand without apparent focal fluid collection. No evidence of foreign body or soft tissue emphysema. Scattered vascular calcifications are noted. IMPRESSION: 1. Nonspecific dorsal soft tissue swelling within the wrist and hand without apparent focal fluid collection. This could reflect superficial soft tissue infection (cellulitis). 2. No evidence of osteomyelitis or acute osseous findings. 3. Scattered degenerative changes, greatest at the scaphotrapeziotrapezoid, 1st carpometacarpal and radiocarpal joints with chondrocalcinosis. 4.  Scapholunate diastasis suggesting an old scapholunate ligament tear. 5. Possible small radiocarpal and distal radioulnar joint effusions, nonspecific. Electronically Signed   By: Richardean Sale M.D.   On: 07/07/2022 16:49        Scheduled Meds:  cephALEXin  500 mg Oral Q6H   enoxaparin (LOVENOX) injection  40 mg Subcutaneous Q24H   fenofibrate  160 mg Oral Daily   ferrous sulfate  325 mg Oral Q breakfast   fluticasone furoate-vilanterol  1 puff Inhalation Daily   And   umeclidinium bromide  1 puff Inhalation Daily   hydrALAZINE  25 mg Oral Q8H   insulin aspart  0-5 Units Subcutaneous QHS   insulin aspart  0-9 Units Subcutaneous TID WC  ketotifen  1 drop Both Eyes BID   lidocaine  1 patch Transdermal Q24H   loteprednol  1 drop Both Eyes QID   And   tobramycin  1 drop Both Eyes QID   methocarbamol  750 mg Oral TID   nicotine  21 mg Transdermal Daily   pantoprazole  40 mg Oral Daily   roflumilast  250 mcg Oral QHS   rosuvastatin  20 mg Oral QHS   tiZANidine  4 mg Oral QHS   Continuous Infusions:  sodium chloride 100 mL/hr at 07/08/22 1020   valproate sodium 1,000 mg (07/08/22 1022)     LOS: 2 days   Sidney Ace, MD [facility]   If 7PM-7AM, please contact night-coverage  07/08/2022, 2:04 PM

## 2022-07-08 NOTE — Progress Notes (Signed)
Occupational Therapy Treatment Patient Details Name: Albert Figueroa MRN: [id number] DOB: [birth year] Today's Date: 07/08/2022   History of present illness Patient is a 66 year old male who presents s/p fall on Tuesday causing him to [facility] on his R hand. Since then has had diffuse and worsening pain in body. 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 Per patient's daughter patient has had 3 falls in past 5 days with worsening cognition in past five days.   OT comments  Pt seen for OT tx after finishing up with nursing assessment. Pt endorsing significant R sided pain, including R ankle. Noted to be swollen compared to L. Pt educated in positioning with pillows for RUE and R ankle to support edema mgt and comfort. Required MAX A for RUE repositioning on pillow with significant pain with any attempts at moving the arm. MAX A to position pillow to float heels. Partial cognitive screening completed, pt demo's decreased insight into deficits, problem solving, and orientation. Daughter in at end of session speaking with pt about need for SNF. Continue to recommend SNF at this time given extensive physical assist required for all aspects of mobility and most ADL.    Recommendations for follow up therapy are one component of a multi-disciplinary discharge planning process, led by the attending physician.  Recommendations may be updated based on patient status, additional functional criteria and insurance authorization.    Follow Up Recommendations  Skilled nursing-short term rehab (<3 hours/day)    Assistance Recommended at Discharge Frequent or constant Supervision/Assistance  Patient can return home with the following  Two people to help with walking and/or transfers;Two people to help with bathing/dressing/bathroom   Equipment Recommendations  Other (comment) (defer)    Recommendations for Other Services       Precautions / Restrictions Precautions Precautions: Fall Required Braces or Orthoses: Other Brace Other Brace: R wrist brace not donned Restrictions Weight Bearing Restrictions: No       Mobility Bed Mobility                    Transfers                         Balance                                           ADL either performed or assessed with clinical judgement   ADL                                              Extremity/Trunk Assessment              Vision       Perception     Praxis      Cognition Arousal/Alertness: Awake/alert Behavior During Therapy: Restless Overall Cognitive Status: No family/caregiver present to determine baseline cognitive functioning                                 General Comments: Partial cognitive screening completed, pt demo's decreased insight into deficits, problem solving, and orientation  Exercises Other Exercises Other Exercises: Pt educated in positioning with pillows for RUE and R ankle to support edema mgt and comfort    Shoulder Instructions       General Comments      Pertinent Vitals/ Pain       Pain Assessment Pain Assessment: 0-10 Pain Score: 10-Worst pain ever Pain Location: R wrist, R shoulder, R ankle Pain Descriptors / Indicators: Grimacing, Guarding, Moaning Pain Intervention(s): Limited activity within patient's tolerance, Monitored during session, Premedicated before session, Repositioned, Patient requesting pain meds-RN notified  Home Living                                          Prior Functioning/Environment              Frequency  Min 2X/week        Progress Toward Goals  OT Goals(current goals can now be found in the care plan section)  Progress towards OT goals: OT to reassess next treatment  Acute Rehab OT Goals Patient Stated Goal: return to PLOF OT Goal Formulation:  With patient/family Time For Goal Achievement: 07/19/22 Potential to Achieve Goals: Good  Plan Discharge plan remains appropriate;Frequency remains appropriate    Co-evaluation                 AM-PAC OT "6 Clicks" Daily Activity     Outcome Measure   Help from another person eating meals?: A Lot Help from another person taking care of personal grooming?: A Lot Help from another person toileting, which includes using toliet, bedpan, or urinal?: Total Help from another person bathing (including washing, rinsing, drying)?: A Lot Help from another person to put on and taking off regular upper body clothing?: A Lot Help from another person to put on and taking off regular lower body clothing?: A Lot 6 Click Score: 11    End of Session    OT Visit Diagnosis: Unsteadiness on feet (R26.81);Muscle weakness (generalized) (M62.81)   Activity Tolerance Patient limited by pain   Patient Left in bed;with call bell/phone within reach;with bed alarm set;with nursing/sitter in room;with family/visitor present   Nurse Communication          Time: 7903-8333 OT Time Calculation (min): 26 min  Charges: OT General Charges $OT Visit: 1 Visit OT Treatments $Therapeutic Activity: 8-22 mins  Ardeth Perfect., MPH, MS, OTR/L ascom [phone] 07/08/22, 4:54 PM

## 2022-07-09 LAB — GLUCOSE, CAPILLARY
Glucose-Capillary: 152 mg/dL — ABNORMAL HIGH (ref 70–99)
Glucose-Capillary: 171 mg/dL — ABNORMAL HIGH (ref 70–99)
Glucose-Capillary: 190 mg/dL — ABNORMAL HIGH (ref 70–99)
Glucose-Capillary: 242 mg/dL — ABNORMAL HIGH (ref 70–99)

## 2022-07-09 LAB — VITAMIN B1: Vitamin B1 (Thiamine): 150.4 nmol/L (ref 66.5–200.0)

## 2022-07-09 NOTE — TOC Progression Note (Signed)
Transition of Care [facility]) - Progression Note    Patient Details  Name: Albert Figueroa MRN: [id number] Date of Birth: [birth year]  Transition of Care [facility]) CM/SW [place], RN Phone Number: 07/09/2022, 10:04 AM  Clinical Narrative:    Called and spoke to the daughter [name], She stated that [facility] was the only facility that she could get her dad to agree to, I called and left a secure voice mail asking if they would review the patient for a bed offer Awaiting to hear back from [facility]   Expected Discharge Plan: [place] Barriers to Discharge: Continued Medical Work up  Expected Discharge Plan and Services Expected Discharge Plan: [place] arrangements for the past 2 months: Single Family Home                                       Social Determinants of Health (SDOH) Interventions    Readmission Risk Interventions     No data to display

## 2022-07-09 NOTE — Care Management Important Message (Signed)
Important Message  Patient Details  Name: VALTON SCHWARTZ MRN: [id number] Date of Birth: 01-31-1956   Medicare Important Message Given:  N/A - LOS <3 / Initial given by admissions     Juliann Pulse A Shandiin Eisenbeis 07/09/2022, 9:19 AM

## 2022-07-09 NOTE — Evaluation (Signed)
Speech Language Pathology Evaluation Patient Details Name: Albert Figueroa MRN: [id number] DOB: September 05, 1956 Today's Date: 07/09/2022 Time: 8882-8003 SLP Time Calculation (min) (ACUTE ONLY): 43 min  Problem List:  Patient Active Problem List   Diagnosis Date Noted   Intractable pain 07/05/2022   Type II diabetes mellitus with renal manifestations ([place]) 07/05/2022   Over weight 07/05/2022   Thrombocytopenia ([place]) 07/05/2022   Fall at home, initial encounter 07/05/2022   Cellulitis of right hand 07/05/2022   COPD (chronic obstructive pulmonary disease) ([place]) 07/05/2022   Iron deficiency anemia 49/17/9150   Acute metabolic encephalopathy 56/97/9480   Neuropathy 07/05/2022   Right wrist pain    Chronic pain syndrome 01/01/2021   Pharmacologic therapy 01/01/2021   Disorder of skeletal system 01/01/2021   Problems influencing health status 01/01/2021   Aortic atherosclerosis ([place]) 02/24/2020   Other emphysema ([place]) 02/24/2020   Medicare annual wellness visit, initial 02/03/2020   Neck pain 09/15/2018   Sacroiliac joint pain 09/08/2018   Lumbar spondylosis 06/11/2018   Left hip pain 06/11/2018   Chronic pain of both knees 06/11/2018   CKD (chronic kidney disease) stage 4, GFR 15-29 ml/min (HCC) 10/29/2017   Obesity (BMI 30-39.9) 11/27/2015   Type 2 diabetes mellitus with chronic kidney disease, with long-term current use of insulin ([place]) 06/13/2015   Tobacco use 06/13/2015   Essential hypertension 06/13/2015   Mixed hyperlipidemia 06/13/2015   Osteoarthritis 06/13/2015   Biceps tendinitis 04/06/2015   Epilepsy without status epilepticus, not intractable ([place]) 12/10/2013   Past Medical History:  Past Medical History:  Diagnosis Date   Arthritis    Diabetes mellitus without complication (HCC)    Emphysema/COPD ([place])    Hyperlipidemia    Hypertension    Kidney disease    stage 3   Lumbar stenosis    Osteoarthrosis    Seizure ([place])    Past Surgical History:  Past Surgical  History:  Procedure Laterality Date   KNEE SURGERY     multiple surgeries on both knees   HPI:  Pt is a 66 y.o. 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     Pt states that he fell 2 days ago after he tripped on the steps.  He states that he has neuropathy and chronic gait instability which caused his fall.   Assessment / Plan / Recommendation Clinical Impression  Pt currently states that "it knocked" him out when he fell.   Pt presents with inability to use his right hand, therefore informal cognitive assessment was provided as pt is right handed.   Pt presents with what is likely acute on chronic moderate to severe memory impairment that contirubutes to pt's multifactorial moderate cognitive impairment. Specifically, pt has deficits in recall of new information, working memory, Counselling psychologist. The resultant characteristic is confabulation of information.   When asked to repeat the following story:  Anna's husband gave her a beautiful ruby righ for her birthday. That night she decided to wear the ring but she couldn't find it. Vicente Males searched everywhere and then began to cry. She reached into her pocket for her handkerchief and there was the ring.   - Pt responded "she lost 'er handkercheif that he had given her but she found it in the closet."     Furthermore, when answering yes/no questions  about the story he provided the following responses:   Was the woman's name Alice? YES   Did her husband give her a ruby ring for her birthday? NO   Did she accidentally throw her present away? NO   Was the woman's name Vicente Males? NO   Did her husband give her a diamond righ for her birthday? NO   Did she find her present in her pocket? No, in the closet       Pt was unaware of his errors and appeared indifferent about severity of errors. He asked his girlfriend if she had noticed any problems with his memory and she stated "yes, for several years now, that is why you went to Dr Manuella Ghazi last year, because you have a little dementia going on."   At this time, recommend skilled rehab at discharge from hospital with skilled ST to target cognitive function to promote safe discharge as well as administration of formal cognitive assessment once pt's right hand is functional. Education provided to pt and his girlfriend on need for skilled rehab.    SLP Assessment  SLP Recommendation/Assessment: All further Speech Lanaguage Pathology  needs can be addressed in the next venue of care SLP Visit Diagnosis: Cognitive communication deficit (R41.841)    Recommendations for follow up therapy are one component of a multi-disciplinary discharge planning process, led by the attending physician.  Recommendations may be updated based on patient status, additional functional criteria and insurance authorization.    Follow Up Recommendations  Skilled nursing-short term rehab (<3 hours/day)    Assistance Recommended at Discharge  None  Functional Status Assessment Patient has had a recent decline in their functional status and/or demonstrates limited ability to make significant improvements in function in a reasonable and predictable amount of time        SLP Evaluation Cognition  Overall Cognitive Status: Impaired/Different from baseline Arousal/Alertness: Awake/alert Orientation Level: Oriented to person;Oriented to place;Oriented to time;Disoriented to situation Year: 2023 Month: October Day of Week: Correct Attention: Selective Selective Attention: Impaired Selective Attention Impairment: Verbal complex;Functional complex Memory: Impaired Memory Impairment: Storage deficit;Retrieval deficit;Decreased recall of new information;Decreased short term  memory;Prospective memory Decreased Short Term Memory: Verbal basic;Functional basic Awareness: Impaired Awareness Impairment: Intellectual impairment;Emergent impairment;Anticipatory impairment Problem Solving: Impaired Problem Solving Impairment: Verbal basic;Functional basic Executive Function:  (all impaired by lower level deficits) Safety/Judgment: Impaired       Comprehension  Auditory Comprehension Overall Auditory Comprehension: Appears within functional limits for tasks assessed Visual Recognition/Discrimination Discrimination: Not tested Reading Comprehension Reading Status: Not tested    Expression Expression Primary Mode of Expression: Verbal Verbal Expression Overall Verbal Expression: Appears within functional limits for tasks assessed Written Expression Dominant Hand: Right Written Expression: Unable to assess (comment) (swollen, unable to move)   Oral / Motor  Oral Motor/Sensory Function Overall Oral Motor/Sensory Function: Within functional limits Motor Speech Overall Motor Speech: Appears within functional limits for tasks assessed           Jayveon Convey B. Rutherford Nail, M.S., CCC-SLP, Mining engineer Certified Brain Injury [place]  [place] Office [phone] Ascom [phone] Fax [phone]

## 2022-07-09 NOTE — Progress Notes (Signed)
PROGRESS NOTE    Albert Figueroa  PYK:[id number] DOB: Nov 16, 1955 DOA: 07/05/2022 PCP: Dion Body, MD    Brief Narrative:  66 y.o. 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   Pt states that he fell 2 days ago after he tripped on the steps.  He states that he has neuropathy and chronic gait instability which caused his fall. He denies loss of consciousness to me.  He has history of chronic pain syndrome on oxycodone 20 mg every 4-6 hours daily as needed (maximum 5 tablets).  He states that after fall, his pain has significantly worsened.  He states that he has pain all over, the worst pain is in right wrist and right hand.  The right wrist and hand pain is constant, sharp, severe, intractable, nonradiating.  The right hand is mildly swelling with mild erythema on examination.  Patient also has moderate pain in the right shoulder.  Patient does not have cough, shortness breath, chest pain.  No nausea, vomiting, diarrhea or abdominal pain.  Denies symptoms of UTI.   Patient was given multiple doses of pain medication in the ED, including 2 mg Dilaudid, 20 mg of oxycodone, 5 mg oxycodone again due to uncontrolled pain. When I saw pt in ED, he is very lethargic, but arousable, moves all extremities, no facial droop or slurred speech. Per report, pt had temperature 100.3 earlier, currently body temperature 98 when saw patient in ED.  Mental status improved the following morning.  Patient swallowing ability back to baseline.  Mentation back to baseline.  Endorses significant pain in right hand associated with swelling and decreased range of motion.  CRP markedly elevated at 20.9.  10/8: Mental status improved, approaching baseline.  However patient could not  tolerate MRI right hand or wrist due to pain.  10/9: Patient is clinically improving.  Continued pain and swelling in the right hand and wrist.  Able to acquire CT right wrist and hand without contrast.  Nonspecific soft tissue swelling with no evidence of abscess or osteomyelitis.  Study limited by lack of contrast however reassuring nonetheless.  Patient expresses strong desire to go home.  He is nearly max assist and can barely ambulate.  His ability to be safe at home is greatly in question.  I did discuss this at length with the patient's daughter via phone.  Apparently patient was recommended skilled nursing facility in the past but had refused.  He has had repeat falls and home is likely not a safe disposition.  Daughter states she will come to the hospital and attempt to have patient agree to skilled nursing facility  10/10: Patient remains reluctant to agree to skilled nursing facility.  TOC spoke to patient's daughter [name].  I spoke to her yesterday.  She stated that [facility] she can get her dad to agree to.  T[facility] looking to see if [facility] will offer a bed  Patient demonstrates poor insight and poor awareness of disease process as well as all mobility.  Assessment & Plan:   Principal Problem:   Intractable pain Active Problems:   Chronic pain syndrome   CKD (chronic kidney disease) stage 4, GFR 15-29 ml/min (HCC)   Essential hypertension   Fall at home, initial encounter   Thrombocytopenia ([place])   Tobacco use  Type II diabetes mellitus with renal manifestations (HCC)   Neuropathy   Acute metabolic encephalopathy   Iron deficiency anemia   COPD (chronic obstructive pulmonary disease) (HCC)   Cellulitis of right hand   Over weight  * Intractable pain Patient has pain all over, but was down right wrist in the right hand.  X-ray negative for right wrist, right hand and right shoulder.  His right hand seems to have cellulitis.  Patient received multiple dose of pain  medication, he becomes lethargic. Mental status back to baseline 10/7 Could not tolerate MRI due to pain CT right wrist and hand with nonspecific edema Plan: Continue multimodal oral pain regimen  Cellulitis of right hand Patient has swelling, mild erythema and tenderness in the right hand dorsal aspect, indicating possible cellulitis.  Patient does not meets criteria for sepsis. Inflammatory markers elevated Could not tolerate MRI as above CT right hand and wrist negative for abscess or osteomyelitis (without contrast) Plan: Transitioned to p.o. Keflex in preparation for discharge  Chronic pain syndrome - See above   CKD (chronic kidney disease) stage 4, GFR 15-29 ml/min (HCC) Renal function close to baseline.  Recent baseline creatinine 2.5-2.6.  His creatinine is at 2.74, BUN 42. -Avoid nonessential nephrotoxins Daily BMP-   Essential hypertension - IV hydralazine as needed -Zestoretic   Fall at home, initial encounter Imaging negative for acute fracture or dislocation. - Fall precaution -PT/OT   Thrombocytopenia (HCC) Platelet 115 (226 on 03/19/2022), unclear etiology.   May be due to ongoing infection of right hand cellulitis -Monitor with daily CBC   Tobacco use - Nicotine patch   Type II diabetes mellitus with renal manifestations (HCC) Recent A1c 6.7, well controlled.  Patient is taking Ozempic, Humalog, Farxiga -Sliding scale insulin   Neuropathy -3 times daily Neurontin   Acute metabolic encephalopathy Etiology is not clear.  CT head negative.  Possibly due to narcotic use.  Other differential diagnosis include delirium, ongoing infection. -Frequent neurochecks   Iron deficiency anemia Hemoglobin stable, 10.4 today (10.4 on 03/19/2022). -Follow-up with CBC   COPD (chronic obstructive pulmonary disease) (HCC) - Stable -Bronchodilators   Over weight BMI 25.10, body weight 81.6 kg. -Diet and exercise.   -Encourage to lose weight.  DVT prophylaxis:  SQ Lovenox Code Status: Full Family Communication: Daughter Weyman Croon [phone] on 10/9 Disposition Plan: Status is: Inpatient Remains inpatient appropriate because: Cellulitis right hand.  Patient is medically ready for discharge however we are is lacking a safe disposition plan.  Currently looking for skilled nursing facility bed.  Level of care: Med-Surg  Consultants:  None  Procedures:  None  Antimicrobials: Keflex  Subjective: Seen and examined.  Lying in bed.  Does not appear in some pain and distress from swelling in the right hand.  Demonstrates poor insight into disease process.  Keep wanting to go home.  Objective: Vitals:   07/08/22 0910 07/08/22 1534 07/09/22 0014 07/09/22 0507  BP: 106/67 102/62 (!) 81/42 123/66  Pulse: 83 83 71 76  Resp:  16  20  Temp:  97.8 F (36.6 C) 98 F (36.7 C) 98.3 F (36.8 C)  TempSrc:      SpO2: 96% 97% 92% 99%  Weight:      Height:        Intake/Output Summary (Last 24 hours) at 07/09/2022 1334 Last data filed at 07/09/2022 0558 Gross per 24 hour  Intake 120 ml  Output 800 ml  Net -680 ml   [employer]  07/05/22 0124  Weight: 81.6 kg    Examination:  General exam: No acute distress Respiratory system: Clear.  Normal work of breathing.  Room air Cardiovascular system: S1-S2, RRR, no murmurs, no pedal edema Gastrointestinal system: Soft, NT/ND, normal bowel sounds Central nervous system: Alert and oriented. No focal neurological deficits. Extremities: Right hand and wrist swollen, tender to touch, decreased ROM, decreased grip strength.  Improved from prior Skin: No rashes, lesions or ulcers Psychiatry: Judgement and insight appear normal. Mood & affect appropriate.     Data Reviewed: I have personally reviewed following labs and imaging studies  CBC: Recent Labs  Lab 07/05/22 0216 07/07/22 0952 07/08/22 0750  WBC 6.3  6.2 8.6 6.7  NEUTROABS 4.1 6.3 4.8  HGB 10.3*  10.4* 8.9* 7.9*  HCT 33.9*   34.8* 28.6* 25.9*  MCV 103.4*  103.3* 100.4* 101.6*  PLT 118*  115* 114* 675*   Basic Metabolic Panel: Recent Labs  Lab 07/05/22 0216 07/06/22 0412 07/07/22 0952 07/08/22 0410 07/08/22 0750  NA 141 144 137  --  142  K 4.1 4.1 4.2  --  3.6  CL 108 110 104  --  111  CO2 26 25 22   --  22  GLUCOSE 166* 162* 195*  --  146*  BUN 42* 35* 43*  --  57*  CREATININE 2.74* 2.03* 1.96* 2.50* 2.40*  CALCIUM 8.8* 9.1 9.0  --  8.8*   GFR: Estimated Creatinine Clearance: 32.7 mL/min (A) (by C-G formula based on SCr of 2.4 mg/dL (H)). Liver Function Tests: Recent Labs  Lab 07/05/22 0215  AST 27  ALT 12  ALKPHOS 71  BILITOT 0.9  PROT 8.1  ALBUMIN 3.3*   No results for input(s): "LIPASE", "AMYLASE" in the last 168 hours. No results for input(s): "AMMONIA" in the last 168 hours. Coagulation Profile: No results for input(s): "INR", "PROTIME" in the last 168 hours. Cardiac Enzymes: Recent Labs  Lab 07/05/22 0215  CKTOTAL 47*   BNP (last 3 results) No results for input(s): "PROBNP" in the last 8760 hours. HbA1C: No results for input(s): "HGBA1C" in the last 72 hours. CBG: Recent Labs  Lab 07/08/22 0806 07/08/22 1149 07/08/22 1722 07/09/22 0853 07/09/22 1150  GLUCAP 148* 164* 185* 152* 171*   Lipid Profile: No results for input(s): "CHOL", "HDL", "LDLCALC", "TRIG", "CHOLHDL", "LDLDIRECT" in the last 72 hours. Thyroid Function Tests: No results for input(s): "TSH", "T4TOTAL", "FREET4", "T3FREE", "THYROIDAB" in the last 72 hours. Anemia Panel: No results for input(s): "VITAMINB12", "FOLATE", "FERRITIN", "TIBC", "IRON", "RETICCTPCT" in the last 72 hours.  Sepsis Labs: Recent Labs  Lab 07/05/22 1500 07/05/22 1717  PROCALCITON  --  0.23  LATICACIDVEN 1.1 1.8    Recent Results (from the past 240 hour(s))  SARS Coronavirus 2 by RT PCR (hospital order, performed in [facility] hospital lab) *cepheid single result test* Anterior Nasal Swab     Status: None   Collection  Time: 07/05/22  3:17 AM   Specimen: Anterior Nasal Swab  Result Value Ref Range Status   SARS Coronavirus 2 by RT PCR NEGATIVE NEGATIVE Final    Comment: (NOTE) SARS-CoV-2 target nucleic acids are NOT DETECTED.  The SARS-CoV-2 RNA is generally detectable in upper and lower respiratory specimens during the acute phase of infection. The lowest concentration of SARS-CoV-2 viral copies this assay can detect is 250 copies / mL. A negative result does not preclude SARS-CoV-2 infection and should not be used as the sole basis for treatment or other patient management  decisions.  A negative result may occur with improper specimen collection / handling, submission of specimen other than nasopharyngeal swab, presence of viral mutation(s) within the areas targeted by this assay, and inadequate number of viral copies (<250 copies / mL). A negative result must be combined with clinical observations, patient history, and epidemiological information.  Fact Sheet for Patients:   https://www.patel.info/  Fact Sheet for Healthcare Providers: https://hall.com/  This test is not yet approved or  cleared by the [place] FDA and has been authorized for detection and/or diagnosis of SARS-CoV-2 by FDA under an Emergency Use Authorization (EUA).  This EUA will remain in effect (meaning this test can be used) for the duration of the COVID-19 declaration under Section 564(b)(1) of the Act, 21 U.S.C. section 360bbb-3(b)(1), unless the authorization is terminated or revoked sooner.  Performed at [facility], [address] [place], [place] [address]   Culture, blood (Routine X 2) w Reflex to ID Panel     Status: None (Preliminary result)   Collection Time: 07/05/22  2:19 PM   Specimen: BLOOD  Result Value Ref Range Status   Specimen Description BLOOD BLOOD RIGHT ARM  Final   Special Requests   Final    BOTTLES DRAWN AEROBIC AND ANAEROBIC Blood  Culture adequate volume   Culture   Final    NO GROWTH 4 DAYS Performed at [facility], [address] [place], [place] [address]    Report Status PENDING  Incomplete  Culture, blood (Routine X 2) w Reflex to ID Panel     Status: None (Preliminary result)   Collection Time: 07/05/22  2:24 PM   Specimen: BLOOD  Result Value Ref Range Status   Specimen Description BLOOD BLOOD LEFT ARM  Final   Special Requests   Final    BOTTLES DRAWN AEROBIC AND ANAEROBIC Blood Culture adequate volume   Culture   Final    NO GROWTH 4 DAYS Performed at [facility], [address], [place], [place] [address]    Report Status PENDING  Incomplete  Surgical pcr screen     Status: Abnormal   Collection Time: 07/07/22 12:08 PM   Specimen: Nasal Mucosa; Nasal Swab  Result Value Ref Range Status   MRSA, PCR NEGATIVE NEGATIVE Final   Staphylococcus aureus POSITIVE (A) NEGATIVE Final    Comment: (NOTE) The Xpert SA Assay (FDA approved for NASAL specimens in patients 49 years of age and older), is one component of a comprehensive surveillance program. It is not intended to diagnose infection nor to guide or monitor treatment. Performed at [facility], [address] [place] [address] [place],  [address]          Radiology Studies: DG Foot Complete Right  Result Date: 07/08/2022 CLINICAL DATA:  Fall. Weakness. Right ankle and foot pain and swelling. EXAM: RIGHT ANKLE - COMPLETE 3+ VIEW; RIGHT FOOT COMPLETE - 3+ VIEW COMPARISON:  None available FINDINGS: Right ankle: Mild distal medial and lateral malleolar and adjacent talar degenerative osteophytes. The ankle mortise is symmetric and intact. Mild-to-moderate distal anterior tibial plafond degenerative osteophytosis with mild osteophytes within the adjacent dorsal talar neck. Small plantar calcaneal heel spur. Mild dorsal talonavicular degenerative osteophytes. No acute fracture or dislocation. Right foot: Mild-to-moderate hallux  valgus. There is mild hammertoe angulation of the second through fifth toes. No acute fracture or dislocation. IMPRESSION: 1. Mild-to-moderate hallux valgus. 2. Mild tibiotalar osteoarthritis. 3. Small plantar calcaneal heel spur. Electronically Signed   By: Viann Fish.D.  On: 07/08/2022 17:05   DG Ankle Complete Right  Result Date: 07/08/2022 CLINICAL DATA:  Fall. Weakness. Right ankle and foot pain and swelling. EXAM: RIGHT ANKLE - COMPLETE 3+ VIEW; RIGHT FOOT COMPLETE - 3+ VIEW COMPARISON:  None available FINDINGS: Right ankle: Mild distal medial and lateral malleolar and adjacent talar degenerative osteophytes. The ankle mortise is symmetric and intact. Mild-to-moderate distal anterior tibial plafond degenerative osteophytosis with mild osteophytes within the adjacent dorsal talar neck. Small plantar calcaneal heel spur. Mild dorsal talonavicular degenerative osteophytes. No acute fracture or dislocation. Right foot: Mild-to-moderate hallux valgus. There is mild hammertoe angulation of the second through fifth toes. No acute fracture or dislocation. IMPRESSION: 1. Mild-to-moderate hallux valgus. 2. Mild tibiotalar osteoarthritis. 3. Small plantar calcaneal heel spur. Electronically Signed   By: Yvonne Kendall M.D.   On: 07/08/2022 17:05   CT HAND RIGHT WO CONTRAST  Result Date: 07/07/2022 CLINICAL DATA:  Soft tissue infection suspected within the right wrist and hand. Unable to tolerate MRI. Chronic kidney disease. EXAM: CT OF THE RIGHT HAND WITHOUT CONTRAST; CT OF THE RIGHT WRIST WITHOUT CONTRAST TECHNIQUE: Multidetector CT imaging of the right wrist and hand was performed according to the standard protocol. Multiplanar CT image reconstructions were also generated. RADIATION DOSE REDUCTION: This exam was performed according to the departmental dose-optimization program which includes automated exposure control, adjustment of the mA and/or kV according to patient size and/or use of iterative  reconstruction technique. COMPARISON:  Radiographs 07/05/2022. FINDINGS: Bones/Joint/Cartilage Images extend from the mid forearm through the fingers of the right hand. There is no evidence of acute fracture, dislocation or bone destruction. There are minor interphalangeal degenerative changes within the fingers. Mild diffuse intercarpal joint space narrowing with osteophytes at the 1st carpometacarpal and scaphotrapeziotrapezoidal joints. As noted radiographically, there is scapholunate diastasis which is likely related to an old scapholunate ligament tear. There is radiocarpal joint space narrowing with fragmented spurring posteriorly. There are possible small radiocarpal and distal radioulnar joint effusions which are nonspecific. Ligaments Suboptimally assessed by CT. As above, scapholunate diastasis suggesting an old injury of the scapholunate ligament. Muscles and Tendons No focal intramuscular abnormalities are identified. The wrist tendons appear grossly unremarkable. The finger tendons appear intact, without significant tenosynovitis. Soft tissues No focal soft tissue abnormalities are identified in the distal forearm. There is mild dorsal soft tissue swelling within the wrist and hand without apparent focal fluid collection. No evidence of foreign body or soft tissue emphysema. Scattered vascular calcifications are noted. IMPRESSION: 1. Nonspecific dorsal soft tissue swelling within the wrist and hand without apparent focal fluid collection. This could reflect superficial soft tissue infection (cellulitis). 2. No evidence of osteomyelitis or acute osseous findings. 3. Scattered degenerative changes, greatest at the scaphotrapeziotrapezoid, 1st carpometacarpal and radiocarpal joints with chondrocalcinosis. 4. Scapholunate diastasis suggesting an old scapholunate ligament tear. 5. Possible small radiocarpal and distal radioulnar joint effusions, nonspecific. Electronically Signed   By: Richardean Sale M.D.    On: 07/07/2022 16:49   CT WRIST RIGHT WO CONTRAST  Result Date: 07/07/2022 CLINICAL DATA:  Soft tissue infection suspected within the right wrist and hand. Unable to tolerate MRI. Chronic kidney disease. EXAM: CT OF THE RIGHT HAND WITHOUT CONTRAST; CT OF THE RIGHT WRIST WITHOUT CONTRAST TECHNIQUE: Multidetector CT imaging of the right wrist and hand was performed according to the standard protocol. Multiplanar CT image reconstructions were also generated. RADIATION DOSE REDUCTION: This exam was performed according to the departmental dose-optimization program which includes automated exposure control, adjustment of  the mA and/or kV according to patient size and/or use of iterative reconstruction technique. COMPARISON:  Radiographs 07/05/2022. FINDINGS: Bones/Joint/Cartilage Images extend from the mid forearm through the fingers of the right hand. There is no evidence of acute fracture, dislocation or bone destruction. There are minor interphalangeal degenerative changes within the fingers. Mild diffuse intercarpal joint space narrowing with osteophytes at the 1st carpometacarpal and scaphotrapeziotrapezoidal joints. As noted radiographically, there is scapholunate diastasis which is likely related to an old scapholunate ligament tear. There is radiocarpal joint space narrowing with fragmented spurring posteriorly. There are possible small radiocarpal and distal radioulnar joint effusions which are nonspecific. Ligaments Suboptimally assessed by CT. As above, scapholunate diastasis suggesting an old injury of the scapholunate ligament. Muscles and Tendons No focal intramuscular abnormalities are identified. The wrist tendons appear grossly unremarkable. The finger tendons appear intact, without significant tenosynovitis. Soft tissues No focal soft tissue abnormalities are identified in the distal forearm. There is mild dorsal soft tissue swelling within the wrist and hand without apparent focal fluid collection.  No evidence of foreign body or soft tissue emphysema. Scattered vascular calcifications are noted. IMPRESSION: 1. Nonspecific dorsal soft tissue swelling within the wrist and hand without apparent focal fluid collection. This could reflect superficial soft tissue infection (cellulitis). 2. No evidence of osteomyelitis or acute osseous findings. 3. Scattered degenerative changes, greatest at the scaphotrapeziotrapezoid, 1st carpometacarpal and radiocarpal joints with chondrocalcinosis. 4. Scapholunate diastasis suggesting an old scapholunate ligament tear. 5. Possible small radiocarpal and distal radioulnar joint effusions, nonspecific. Electronically Signed   By: Richardean Sale M.D.   On: 07/07/2022 16:49        Scheduled Meds:  cephALEXin  500 mg Oral Q6H   enoxaparin (LOVENOX) injection  40 mg Subcutaneous Q24H   fenofibrate  160 mg Oral Daily   ferrous sulfate  325 mg Oral Q breakfast   fluticasone furoate-vilanterol  1 puff Inhalation Daily   And   umeclidinium bromide  1 puff Inhalation Daily   hydrALAZINE  25 mg Oral Q8H   insulin aspart  0-5 Units Subcutaneous QHS   insulin aspart  0-9 Units Subcutaneous TID WC   ketotifen  1 drop Both Eyes BID   lidocaine  1 patch Transdermal Q24H   loteprednol  1 drop Both Eyes QID   And   tobramycin  1 drop Both Eyes QID   methocarbamol  750 mg Oral TID   nicotine  21 mg Transdermal Daily   pantoprazole  40 mg Oral Daily   roflumilast  250 mcg Oral QHS   rosuvastatin  20 mg Oral QHS   tiZANidine  4 mg Oral QHS   Continuous Infusions:  sodium chloride 100 mL/hr at 07/08/22 1020   valproate sodium 1,000 mg (07/09/22 0940)     LOS: 3 days   Sidney Ace, MD [facility]   If 7PM-7AM, please contact night-coverage  07/09/2022, 1:34 PM

## 2022-07-09 NOTE — TOC Progression Note (Signed)
Transition of Care [facility]) - Progression Note    Patient Details  Name: Albert Figueroa MRN: [id number] Date of Birth: 09/02/1956  [facility]) CM/SW [place], RN Phone Number: 07/09/2022, 1:51 PM  Clinical Narrative:    [facility] is revieweing for possible bed offer   Expected Discharge Plan: [place] Barriers to Discharge: Continued Medical Work up  Expected Discharge Plan and Services Expected Discharge Plan: [place] arrangements for the past 2 months: Single Family Home                                       Social Determinants of Health (SDOH) Interventions    Readmission Risk Interventions     No data to display

## 2022-07-09 NOTE — Plan of Care (Signed)
  Problem: Coping: Goal: Ability to adjust to condition or change in health will improve Outcome: Progressing   Problem: Fluid Volume: Goal: Ability to maintain a balanced intake and output will improve Outcome: Progressing   Problem: Health Behavior/Discharge Planning: Goal: Ability to identify and utilize available resources and services will improve Outcome: Progressing   Problem: Skin Integrity: Goal: Risk for impaired skin integrity will decrease Outcome: Progressing    Problem: Tissue Perfusion: Goal: Adequacy of tissue perfusion will improve Outcome: Progressing   Problem: Health Behavior/Discharge Planning: Goal: Ability to manage health-related needs will improve Outcome: Progressing   Problem: Clinical Measurements: Goal: Will remain free from infection Outcome: Progressing   Problem: Clinical Measurements: Goal: Respiratory complications will improve Outcome: Progressing   Problem: Clinical Measurements: Goal: Cardiovascular complication will be avoided Outcome: Progressing   Problem: Safety: Goal: Ability to remain free from injury will improve Outcome: Progressing   Problem: Skin Integrity: Goal: Risk for impaired skin integrity will decrease Outcome: Progressing

## 2022-07-09 NOTE — TOC Progression Note (Signed)
Transition of Care [facility]) - Progression Note    Patient Details  Name: Albert Figueroa MRN: [id number] Date of Birth: [birth year]  Transition of Care [facility]) CM/SW [place], RN Phone Number: 07/09/2022, 3:52 PM  Clinical Narrative:    Ins approved to go to [facility] 10/11-10/13 ref number [id number]   Expected Discharge Plan: [place] Barriers to Discharge: Continued Medical Work up  Expected Discharge Plan and Services Expected Discharge Plan: [place] arrangements for the past 2 months: Single Family Home                                       Social Determinants of Health (SDOH) Interventions    Readmission Risk Interventions     No data to display

## 2022-07-09 NOTE — Progress Notes (Signed)
Physical Therapy Treatment Patient Details Name: Albert Figueroa MRN: [id number] DOB: [birth year] Today's Date: 07/09/2022   History of Present Illness Patient is a 66 year old male who presents s/p fall on Tuesday causing him to [facility] on his R hand. Since then has had diffuse and worsening pain in body. 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 Per patient's daughter patient has had 3 falls in past 5 days with worsening cognition in past five days.  Pt's SO at bedside, provides clarification on some PMH detail. Pt assisted with a lot of repositioning to maximize ability to perform A/ROM in BUE/BLE as many of his chronic arthritic areas are stiffer than typical and creating a great deal of discomfort in bed. 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 Rt ankle and wrist remain visibly edematous s/p acute injury and not tolerate to much ROM at all. Rt should is an acute on chronic injury no visualized, just repositioned for comfort. Engaged core muscles for upright sitting and trunk flexion while in bed, utilized use of LUE to facilitate as high degree of weakness mandates need for any limb self assistance to maximize independence. Pt given periodic rest breaks as needed, mostly for pain. Has some back pain and neck pain which is not as severe when exercises cease. Pt left upright, HOB at 63 degrees at EOS, cues to NSG/SO to reposition prior to being left alone, likely will have limited tolerance due to pain and inability to weight shift. Good session today, but pt remains incredibly weak, his awareness of such continues to become more clear.   PT Comments       Recommendations for follow up therapy are one component of a multi-disciplinary discharge planning process, led by the attending physician.   Recommendations may be updated based on patient status, additional functional criteria and insurance authorization.  Follow Up Recommendations  Skilled nursing-short term rehab (<3 hours/day) Can patient physically be transported by private vehicle: No   Assistance Recommended at Discharge Frequent or constant Supervision/Assistance  Patient can return home with the following Two people to help with walking and/or transfers;Two people to help with bathing/dressing/bathroom;Assistance with cooking/housework;Direct supervision/assist for medications management;Direct supervision/assist for financial management;Assist for transportation;Help with stairs or ramp for entrance   Equipment Recommendations       Recommendations for Other Services       Precautions / Restrictions       Mobility  Bed Mobility                    Transfers                        Ambulation/Gait                   Stairs             Wheelchair Mobility    Modified Rankin (Stroke Patients Only)       Balance                                            Cognition  Exercises Other Exercises Other Exercises: BLE: Heel slides, SAQ 1x15 bilat; resisted hip extension 1x10 on Left side only Other Exercises: Short sitting situps with LUE pulling authors hand 1x5 from [facility] at 45 degrees, 1x5x15sec with bed rail left, HOB at 63 degrees to attempt UE-free trunk flexion (unable) so support given at back from author Other Exercises: Rt hand opening x5 (difficulty following commands and tolerating pain; RUE supination/pronation (cues difficult), UE positioning on pillow for comfort. Other Exercises: MaxA positoning in bed upright for back pain and neck pain improvement    General Comments        Pertinent Vitals/Pain      Home Living     Available Help at Discharge: [place] Type of Home: House           Home Equipment: Cane - single Barista (2 wheels)      Prior Function            PT Goals (current goals can now be found in the care plan section) Acute Rehab PT Goals PT Goal Formulation: With patient Progress towards PT goals: Progressing toward goals    Frequency    Min 2X/week      PT Plan Current plan remains appropriate    Co-evaluation              AM-PAC PT "6 Clicks" Mobility   Outcome Measure  Help needed turning from your back to your side while in a flat bed without using bedrails?: Total Help needed moving from lying on your back to sitting on the side of a flat bed without using bedrails?: Total Help needed moving to and from a bed to a chair (including a wheelchair)?: Total Help needed standing up from a chair using your arms (e.g., wheelchair or bedside chair)?: Total Help needed to walk in hospital room?: Total Help needed climbing 3-5 steps with a railing? : Total 6 Click Score: 6    End of Session Equipment Utilized During Treatment: Gait belt Activity Tolerance: Patient limited by pain;Patient limited by fatigue;Patient tolerated treatment well Patient left: in bed;with restraints reapplied;with nursing/sitter in room Nurse Communication: Mobility status PT Visit Diagnosis: Unsteadiness on feet (R26.81);Other abnormalities of gait and mobility (R26.89);Repeated falls (R29.6);Muscle weakness (generalized) (M62.81);History of falling (Z91.81);Difficulty in walking, not elsewhere classified (R26.2);Pain Pain - Right/Left: Right Pain - part of body: Ankle and joints of foot;Knee;Shoulder;Hand;Arm     Time: 1410-1450 PT Time Calculation (min) (ACUTE ONLY): 40 min  Charges:  $Therapeutic Exercise: 23-37 mins $Therapeutic Activity: 8-22 mins                    4:46 PM, 07/09/22 Etta Grandchild, PT, DPT Physical Therapist - [facility]  [phone]  ([place])    Cerys Winget C 07/09/2022, 4:39 PM

## 2022-07-09 NOTE — TOC Progression Note (Signed)
Transition of Care [facility]) - Progression Note    Patient Details  Name: Albert Figueroa MRN: [id number] Date of Birth: 03-31-1956  Transition [facility] CM/SW [place], RN Phone Number: 07/09/2022, 3:12 PM  Clinical Narrative:     Offered the bed at [facility] that the patient wanted, Spoke with [name], they accepted the bed, Ins pending  Expected Discharge Plan: [place] Barriers to Discharge: Continued Medical Work up  Expected Discharge Plan and Services Expected Discharge Plan: [place] arrangements for the past 2 months: Single Family Home                                       Social Determinants of Health (SDOH) Interventions    Readmission Risk Interventions     No data to display

## 2022-07-10 ENCOUNTER — Encounter: Payer: Self-pay | Admitting: Internal Medicine

## 2022-07-10 DIAGNOSIS — R52 Pain, unspecified: Secondary | ICD-10-CM | POA: Diagnosis not present

## 2022-07-10 DIAGNOSIS — L03113 Cellulitis of right upper limb: Secondary | ICD-10-CM

## 2022-07-10 DIAGNOSIS — G894 Chronic pain syndrome: Secondary | ICD-10-CM | POA: Diagnosis not present

## 2022-07-10 DIAGNOSIS — W19XXXA Unspecified fall, initial encounter: Secondary | ICD-10-CM | POA: Diagnosis not present

## 2022-07-10 LAB — BASIC METABOLIC PANEL
Anion gap: 9 (ref 5–15)
BUN: 60 mg/dL — ABNORMAL HIGH (ref 8–23)
CO2: 23 mmol/L (ref 22–32)
Calcium: 8.8 mg/dL — ABNORMAL LOW (ref 8.9–10.3)
Chloride: 110 mmol/L (ref 98–111)
Creatinine, Ser: 2.15 mg/dL — ABNORMAL HIGH (ref 0.61–1.24)
GFR, Estimated: 33 mL/min — ABNORMAL LOW (ref >60)
Glucose, Bld: 163 mg/dL — ABNORMAL HIGH (ref 70–99)
Potassium: 4.1 mmol/L (ref 3.5–5.1)
Sodium: 142 mmol/L (ref 135–145)

## 2022-07-10 LAB — CULTURE, BLOOD (ROUTINE X 2)
Culture: NO GROWTH
Culture: NO GROWTH
Special Requests: ADEQUATE
Special Requests: ADEQUATE

## 2022-07-10 LAB — CBC WITH DIFFERENTIAL/PLATELET
Abs Immature Granulocytes: 0.04 10*3/uL (ref 0.00–0.07)
Basophils Absolute: 0 10*3/uL (ref 0.0–0.1)
Basophils Relative: 1 %
Eosinophils Absolute: 0 10*3/uL (ref 0.0–0.5)
Eosinophils Relative: 1 %
HCT: 28.9 % — ABNORMAL LOW (ref 39.0–52.0)
Hemoglobin: 8.7 g/dL — ABNORMAL LOW (ref 13.0–17.0)
Immature Granulocytes: 1 %
Lymphocytes Relative: 19 %
Lymphs Abs: 1.1 10*3/uL (ref 0.7–4.0)
MCH: 30.4 pg (ref 26.0–34.0)
MCHC: 30.1 g/dL (ref 30.0–36.0)
MCV: 101 fL — ABNORMAL HIGH (ref 80.0–100.0)
Monocytes Absolute: 0.8 10*3/uL (ref 0.1–1.0)
Monocytes Relative: 13 %
Neutro Abs: 4 10*3/uL (ref 1.7–7.7)
Neutrophils Relative %: 65 %
Platelets: 176 10*3/uL (ref 150–400)
RBC: 2.86 MIL/uL — ABNORMAL LOW (ref 4.22–5.81)
RDW: 15.7 % — ABNORMAL HIGH (ref 11.5–15.5)
WBC: 6 10*3/uL (ref 4.0–10.5)
nRBC: 0 % (ref 0.0–0.2)

## 2022-07-10 LAB — GLUCOSE, CAPILLARY
Glucose-Capillary: 159 mg/dL — ABNORMAL HIGH (ref 70–99)
Glucose-Capillary: 141 mg/dL — ABNORMAL HIGH (ref 70–99)

## 2022-07-10 MED ORDER — CEPHALEXIN 500 MG PO CAPS: 500.0000 mg | ORAL_CAPSULE | Freq: Three times a day (TID) | ORAL | 0 refills | Status: AC

## 2022-07-10 MED ORDER — TORSEMIDE 20 MG PO TABS
20.0000 mg | ORAL_TABLET | Freq: Once | ORAL | Status: AC
  Administered 2022-07-10: 20 mg via ORAL
  Filled 2022-07-10: qty 1

## 2022-07-10 MED ORDER — HYDRALAZINE HCL 25 MG PO TABS: 25.0000 mg | ORAL_TABLET | Freq: Three times a day (TID) | ORAL | Status: DC

## 2022-07-10 MED ORDER — NICOTINE 21 MG/24HR TD PT24: 21.0000 mg | MEDICATED_PATCH | Freq: Every day | TRANSDERMAL | Status: DC

## 2022-07-10 MED ORDER — TIZANIDINE HCL 4 MG PO TABS: 4.0000 mg | ORAL_TABLET | Freq: Every day | ORAL | 0 refills | Status: DC

## 2022-07-10 MED ORDER — OXYCODONE HCL 10 MG PO TABS: 10.0000 mg | ORAL_TABLET | Freq: Three times a day (TID) | ORAL | 0 refills | Status: DC | PRN

## 2022-07-10 NOTE — Plan of Care (Signed)

## 2022-07-10 NOTE — Discharge Summary (Addendum)
Physician Discharge Summary   Patient: Albert Figueroa MRN: [id number] DOB: 09-26-1956  Admit date:     07/05/2022  Discharge date: 07/10/22  Discharge Physician: Jennye Boroughs   PCP: Dion Body, MD   Recommendations at discharge:     Follow up with Dr. Peggye Ley, orthopedic surgeon, in 1 week for further evaluation of right hand swelling Follow-up with PCP in 1 to 2 weeks Follow-up with physician in the next 1 within 3 days of discharge  Discharge Diagnoses: Principal Problem:   Intractable pain Active Problems:   Chronic pain syndrome   CKD (chronic kidney disease) stage 4, GFR 15-29 ml/min ([place])   Essential hypertension   Fall at home, initial encounter   Thrombocytopenia ([place])   Tobacco use   Type II diabetes mellitus with renal manifestations ([place])   Neuropathy   Toxic encephalopathy   Iron deficiency anemia   COPD (chronic obstructive pulmonary disease) (HCC)   Cellulitis of right hand   Over weight  Resolved Problems:   * No resolved hospital problems. [facility] Course:  Mr. 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  He presented to the hospital because of severe pain in his right hand after a fall.  He tripped on the steps and fell.  He states that he had worsening pain all over his body but then he also noticed painful swelling of the right hand.  In the ED, he was given multiple doses of opiate analgesics for pain.  He became lethargic likely from opiate analgesics.  CT of the right hand and wrist did not show any evidence of acute fracture but findings were concerning for cellulitis of the right hand.  He was admitted to the hospital for cellulitis  of the right hand.  He was treated with empiric IV antibiotics and analgesics.  Change in mental status/lethargy was probably due to to acute toxic encephalopathy from opiates.  He was evaluated by PT and OT who recommended further rehabilitation at the skilled nursing facility.  He is deemed stable for discharge to SNF today.         Pain control - [employer] Controlled Substance Reporting System database was reviewed. and patient was instructed, not to drive, operate heavy machinery, perform activities at heights, swimming or participation in water activities or provide baby-sitting services while on Pain, Sleep and Anxiety Medications; until their outpatient Physician has advised to do so again. Also recommended to not to take more than prescribed Pain, Sleep and Anxiety Medications.  Consultants: None Procedures performed: None  Disposition: Skilled nursing facility Diet recommendation:  Discharge Diet Orders (From admission, onward)     Start     Ordered   07/10/22 0000  Diet - low sodium heart healthy        07/10/22 1214           Cardiac and Carb modified diet DISCHARGE MEDICATION: Allergies as of 07/10/2022       Reactions   Gabapentin Other (See Comments)   Unknown reaction (Per Significant Other)   Amlodipine    Other reaction(s): Other (see comments) Caused elevated blood pressure and headaches        Medication List     STOP taking these medications  insulin lispro 100 UNIT/ML injection Commonly known as: HUMALOG   lisinopril-hydrochlorothiazide 20-25 MG tablet Commonly known as: ZESTORETIC       TAKE these medications    azelastine 0.05 % ophthalmic solution Commonly known as: OPTIVAR Place 1 drop into both eyes 2 (two) times daily.   cephALEXin 500 MG capsule Commonly known as: KEFLEX Take 1 capsule (500 mg total) by mouth 3 (three) times daily for 3 days.   Combivent Respimat 20-100 MCG/ACT Aers respimat Generic drug:  Ipratropium-Albuterol Inhale 1 puff into the lungs 4 (four) times daily.   cyclobenzaprine 10 MG tablet Commonly known as: FLEXERIL Take 10 mg by mouth 3 (three) times daily as needed for muscle spasms.   Daliresp 250 MCG Tabs Generic drug: Roflumilast Take 250 mcg by mouth at bedtime.   Dexilant 60 MG capsule Generic drug: dexlansoprazole Take 60 mg by mouth daily.   divalproex 500 MG 24 hr tablet Commonly known as: DEPAKOTE ER Take 1,000 mg by mouth in the morning and at bedtime.   DULoxetine 30 MG capsule Commonly known as: CYMBALTA Take 30 mg by mouth daily.   Farxiga 10 MG Tabs tablet Generic drug: dapagliflozin propanediol Take 10 mg by mouth daily.   fenofibrate 160 MG tablet Take 160 mg by mouth daily.   ferrous sulfate 325 (65 FE) MG tablet Take 325 mg by mouth daily with breakfast.   hydrALAZINE 25 MG tablet Commonly known as: APRESOLINE Take 1 tablet (25 mg total) by mouth every 8 (eight) hours.   nicotine 21 mg/24hr patch Commonly known as: NICODERM CQ - dosed in mg/24 hours Place 1 patch (21 mg total) onto the skin daily. Start taking on: July 11, 2022   Oxycodone HCl 10 MG Tabs Take 1 tablet (10 mg total) by mouth every 8 (eight) hours as needed for moderate pain or severe pain. What changed:  medication strength how much to take when to take this reasons to take this additional instructions   Ozempic (0.25 or 0.5 MG/DOSE) 2 MG/3ML Sopn Generic drug: Semaglutide(0.25 or 0.5MG /DOS) Inject 2 mg into the skin once a week.   rosuvastatin 20 MG tablet Commonly known as: CRESTOR Take 20 mg by mouth at bedtime.   tiZANidine 4 MG tablet Commonly known as: ZANAFLEX Take 1 tablet (4 mg total) by mouth at bedtime. What changed: when to take this   torsemide 20 MG tablet Commonly known as: DEMADEX Take 20 mg by mouth daily as needed for edema.   Trelegy Ellipta 100-62.5-25 MCG/ACT Aepb Generic drug: Fluticasone-Umeclidin-Vilant Inhale 1 puff  into the lungs daily.   Zylet 0.5-0.3 % Susp Generic drug: Loteprednol-Tobramycin Place 1 drop into both eyes 4 (four) times daily.        Contact information for follow-up providers     Rutherford Limerick, MD. Schedule an appointment as soon as possible for a visit in 1 week(s).   Why: [place]  Call [phone] for further evaluation of right hand swelling             Contact information for after-discharge care     Destination     [facility] Preferred SNF .   Service: Skilled Nursing Contact information: [place] [phone]                    Discharge Exam: Danley Danker Weights   07/05/22 0124  Weight: 81.6 kg   GEN: NAD SKIN: No rash EYES: EOMI  ENT: MMM CV: RRR PULM: CTA B ABD: soft, ND, NT, +BS CNS: AAO x 3, non focal EXT: Bilateral leg edema (R>L) without erythema or tenderness.  Right hand swollen with minimal tenderness but without erythema   Condition at discharge: good  The results of significant diagnostics from this hospitalization (including imaging, microbiology, ancillary and laboratory) are listed below for reference.   Imaging Studies: DG Foot Complete Right  Result Date: 07/08/2022 CLINICAL DATA:  Fall. Weakness. Right ankle and foot pain and swelling. EXAM: RIGHT ANKLE - COMPLETE 3+ VIEW; RIGHT FOOT COMPLETE - 3+ VIEW COMPARISON:  None available FINDINGS: Right ankle: Mild distal medial and lateral malleolar and adjacent talar degenerative osteophytes. The ankle mortise is symmetric and intact. Mild-to-moderate distal anterior tibial plafond degenerative osteophytosis with mild osteophytes within the adjacent dorsal talar neck. Small plantar calcaneal heel spur. Mild dorsal talonavicular degenerative osteophytes. No acute fracture or dislocation. Right foot: Mild-to-moderate hallux valgus. There is mild hammertoe angulation of the second through fifth toes.  No acute fracture or dislocation. IMPRESSION: 1. Mild-to-moderate hallux valgus. 2. Mild tibiotalar osteoarthritis. 3. Small plantar calcaneal heel spur. Electronically Signed   By: Yvonne Kendall M.D.   On: 07/08/2022 17:05   DG Ankle Complete Right  Result Date: 07/08/2022 CLINICAL DATA:  Fall. Weakness. Right ankle and foot pain and swelling. EXAM: RIGHT ANKLE - COMPLETE 3+ VIEW; RIGHT FOOT COMPLETE - 3+ VIEW COMPARISON:  None available FINDINGS: Right ankle: Mild distal medial and lateral malleolar and adjacent talar degenerative osteophytes. The ankle mortise is symmetric and intact. Mild-to-moderate distal anterior tibial plafond degenerative osteophytosis with mild osteophytes within the adjacent dorsal talar neck. Small plantar calcaneal heel spur. Mild dorsal talonavicular degenerative osteophytes. No acute fracture or dislocation. Right foot: Mild-to-moderate hallux valgus. There is mild hammertoe angulation of the second through fifth toes. No acute fracture or dislocation. IMPRESSION: 1. Mild-to-moderate hallux valgus. 2. Mild tibiotalar osteoarthritis. 3. Small plantar calcaneal heel spur. Electronically Signed   By: Yvonne Kendall M.D.   On: 07/08/2022 17:05   CT HAND RIGHT WO CONTRAST  Result Date: 07/07/2022 CLINICAL DATA:  Soft tissue infection suspected within the right wrist and hand. Unable to tolerate MRI. Chronic kidney disease. EXAM: CT OF THE RIGHT HAND WITHOUT CONTRAST; CT OF THE RIGHT WRIST WITHOUT CONTRAST TECHNIQUE: Multidetector CT imaging of the right wrist and hand was performed according to the standard protocol. Multiplanar CT image reconstructions were also generated. RADIATION DOSE REDUCTION: This exam was performed according to the departmental dose-optimization program which includes automated exposure control, adjustment of the mA and/or kV according to patient size and/or use of iterative reconstruction technique. COMPARISON:  Radiographs 07/05/2022. FINDINGS:  Bones/Joint/Cartilage Images extend from the mid forearm through the fingers of the right hand. There is no evidence of acute fracture, dislocation or bone destruction. There are minor interphalangeal degenerative changes within the fingers. Mild diffuse intercarpal joint space narrowing with osteophytes at the 1st carpometacarpal and scaphotrapeziotrapezoidal joints. As noted radiographically, there is scapholunate diastasis which is likely related to an old scapholunate ligament tear. There is radiocarpal joint space narrowing with fragmented spurring posteriorly. There are possible small radiocarpal and distal radioulnar joint effusions which are nonspecific. Ligaments Suboptimally assessed by CT. As above, scapholunate diastasis suggesting an old injury of the scapholunate ligament. Muscles and Tendons No focal intramuscular abnormalities are identified. The wrist tendons appear grossly unremarkable. The finger tendons appear intact, without significant tenosynovitis. Soft tissues No focal soft tissue abnormalities are identified in the distal forearm.  There is mild dorsal soft tissue swelling within the wrist and hand without apparent focal fluid collection. No evidence of foreign body or soft tissue emphysema. Scattered vascular calcifications are noted. IMPRESSION: 1. Nonspecific dorsal soft tissue swelling within the wrist and hand without apparent focal fluid collection. This could reflect superficial soft tissue infection (cellulitis). 2. No evidence of osteomyelitis or acute osseous findings. 3. Scattered degenerative changes, greatest at the scaphotrapeziotrapezoid, 1st carpometacarpal and radiocarpal joints with chondrocalcinosis. 4. Scapholunate diastasis suggesting an old scapholunate ligament tear. 5. Possible small radiocarpal and distal radioulnar joint effusions, nonspecific. Electronically Signed   By: Richardean Sale M.D.   On: 07/07/2022 16:49   CT WRIST RIGHT WO CONTRAST  Result Date:  07/07/2022 CLINICAL DATA:  Soft tissue infection suspected within the right wrist and hand. Unable to tolerate MRI. Chronic kidney disease. EXAM: CT OF THE RIGHT HAND WITHOUT CONTRAST; CT OF THE RIGHT WRIST WITHOUT CONTRAST TECHNIQUE: Multidetector CT imaging of the right wrist and hand was performed according to the standard protocol. Multiplanar CT image reconstructions were also generated. RADIATION DOSE REDUCTION: This exam was performed according to the departmental dose-optimization program which includes automated exposure control, adjustment of the mA and/or kV according to patient size and/or use of iterative reconstruction technique. COMPARISON:  Radiographs 07/05/2022. FINDINGS: Bones/Joint/Cartilage Images extend from the mid forearm through the fingers of the right hand. There is no evidence of acute fracture, dislocation or bone destruction. There are minor interphalangeal degenerative changes within the fingers. Mild diffuse intercarpal joint space narrowing with osteophytes at the 1st carpometacarpal and scaphotrapeziotrapezoidal joints. As noted radiographically, there is scapholunate diastasis which is likely related to an old scapholunate ligament tear. There is radiocarpal joint space narrowing with fragmented spurring posteriorly. There are possible small radiocarpal and distal radioulnar joint effusions which are nonspecific. Ligaments Suboptimally assessed by CT. As above, scapholunate diastasis suggesting an old injury of the scapholunate ligament. Muscles and Tendons No focal intramuscular abnormalities are identified. The wrist tendons appear grossly unremarkable. The finger tendons appear intact, without significant tenosynovitis. Soft tissues No focal soft tissue abnormalities are identified in the distal forearm. There is mild dorsal soft tissue swelling within the wrist and hand without apparent focal fluid collection. No evidence of foreign body or soft tissue emphysema. Scattered  vascular calcifications are noted. IMPRESSION: 1. Nonspecific dorsal soft tissue swelling within the wrist and hand without apparent focal fluid collection. This could reflect superficial soft tissue infection (cellulitis). 2. No evidence of osteomyelitis or acute osseous findings. 3. Scattered degenerative changes, greatest at the scaphotrapeziotrapezoid, 1st carpometacarpal and radiocarpal joints with chondrocalcinosis. 4. Scapholunate diastasis suggesting an old scapholunate ligament tear. 5. Possible small radiocarpal and distal radioulnar joint effusions, nonspecific. Electronically Signed   By: Richardean Sale M.D.   On: 07/07/2022 16:49   DG Shoulder Right  Result Date: 07/05/2022 CLINICAL DATA:  RIGHT shoulder pain after falling 2 days ago. EXAM: RIGHT SHOULDER - 2+ VIEW COMPARISON:  MRI of the shoulder from 2021 FINDINGS: Humeral head is located. Glenohumeral degenerative changes. Scapula is intact to the extent evaluated. Shunt tubing is incidentally noted crossing the field of view with extensive calcification along its course in the neck and chest wall. Soft tissues are otherwise unremarkable. IMPRESSION: 1. No acute fracture or dislocation. 2. Glenohumeral degenerative changes. 3. Shunt tubing with extensive calcification along its course in the neck and chest wall. Correlate with any signs of shunt malfunction. Electronically Signed   By: Zetta Bills M.D.   On:  07/05/2022 15:02   DG Wrist Complete Right  Result Date: 07/05/2022 CLINICAL DATA:  Recent fall with right wrist pain, initial encounter EXAM: RIGHT WRIST - COMPLETE 3+ VIEW COMPARISON:  None Available. FINDINGS: Changes of prior fracture along the posterior aspect of the distal radius are seen with nonunion. No acute fracture is noted. Widening of the scapholunate space is noted consistent with chronic ligamentous injury. IMPRESSION: Chronic changes without acute abnormality. Electronically Signed   By: Inez Catalina M.D.   On:  07/05/2022 02:45   DG Hand Complete Right  Result Date: 07/05/2022 CLINICAL DATA:  Right hand pain following fall, initial encounter EXAM: RIGHT HAND - COMPLETE 3+ VIEW COMPARISON:  None Available. 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 No acute fracture is seen. Interphalangeal degenerative changes are noted. No gross soft tissue abnormality is noted. Changes of prior distal radial fracture with nonunion are noted posteriorly. IMPRESSION: Chronic changes without acute abnormality. Electronically Signed   By: Inez Catalina M.D.   On: 07/05/2022 02:44   CT HEAD WO CONTRAST  Result Date: 07/05/2022 CLINICAL DATA:  Fall 2 days ago with headaches and neck pain, initial encounter EXAM: CT HEAD WITHOUT CONTRAST CT CERVICAL SPINE WITHOUT CONTRAST TECHNIQUE: Multidetector CT imaging of the head and cervical spine was performed following the standard protocol without intravenous contrast. Multiplanar CT image reconstructions of the cervical spine were also generated. RADIATION DOSE REDUCTION: This exam was performed according to the departmental dose-optimization program which includes automated exposure control, adjustment of the mA and/or kV according to patient size and/or use of iterative reconstruction technique. COMPARISON:  None Available. FINDINGS: CT HEAD FINDINGS Brain: Mild atrophic changes are noted. No findings to suggest acute hemorrhage, acute infarction or space-occupying mass lesion are noted. A right-sided ventricular shunt catheter is noted extending into the right lateral ventricle. 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 Vascular: No hyperdense vessel or unexpected calcification. Skull: Normal. Negative for fracture or focal lesion. Sinuses/Orbits: No acute finding. Other: None. CT CERVICAL SPINE FINDINGS Alignment: Within normal limits. Skull base and vertebrae: 7 cervical segments are  well visualized. Vertebral body height is well maintained. Multilevel osteophytic changes and facet hypertrophic changes are seen. No acute fracture or acute facet abnormality is noted. Soft tissues and spinal canal: Surrounding soft tissue structures show a right-sided shunt catheter as previously described. No other focal abnormality is noted. Upper chest: Visualized lung apices are within normal limits. Other: None IMPRESSION: CT of the head: Chronic changes without acute abnormality. CT of the cervical spine: Multilevel degenerative change without acute abnormality. Electronically Signed   By: Inez Catalina M.D.   On: 07/05/2022 02:29   CT Cervical Spine Wo Contrast  Result Date: 07/05/2022 CLINICAL DATA:  Fall 2 days ago with headaches and neck pain, initial encounter EXAM: CT HEAD WITHOUT CONTRAST CT CERVICAL SPINE WITHOUT CONTRAST TECHNIQUE: Multidetector CT imaging of the head and cervical spine was performed following the standard protocol without intravenous contrast. Multiplanar CT image reconstructions of the cervical spine were also generated. RADIATION DOSE REDUCTION: This exam was performed according to the departmental dose-optimization program which includes automated exposure control, adjustment of the mA and/or kV according to patient size and/or use of iterative reconstruction technique. COMPARISON:  None Available. FINDINGS: CT HEAD FINDINGS Brain: Mild atrophic changes are noted. No findings to suggest acute hemorrhage, acute infarction or space-occupying mass lesion are noted. A right-sided ventricular shunt catheter is noted extending  into the right lateral ventricle. 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 Vascular: No hyperdense vessel or unexpected calcification. Skull: Normal. Negative for fracture or focal lesion. Sinuses/Orbits: No acute finding. Other: None. CT CERVICAL SPINE FINDINGS Alignment: Within normal limits. Skull base and vertebrae: 7 cervical segments are well  visualized. Vertebral body height is well maintained. Multilevel osteophytic changes and facet hypertrophic changes are seen. No acute fracture or acute facet abnormality is noted. Soft tissues and spinal canal: Surrounding soft tissue structures show a right-sided shunt catheter as previously described. No other focal abnormality is noted. Upper chest: Visualized lung apices are within normal limits. Other: None IMPRESSION: CT of the head: Chronic changes without acute abnormality. CT of the cervical spine: Multilevel degenerative change without acute abnormality. Electronically Signed   By: Inez Catalina M.D.   On: 07/05/2022 02:29    Microbiology: Results for orders placed or performed during the hospital encounter of 07/05/22  SARS Coronavirus 2 by RT PCR (hospital order, performed in [facility] hospital lab) *cepheid single result test* Anterior Nasal Swab     Status: None   Collection Time: 07/05/22  3:17 AM   Specimen: Anterior Nasal Swab  Result Value Ref Range Status   SARS Coronavirus 2 by RT PCR NEGATIVE NEGATIVE Final    Comment: (NOTE) SARS-CoV-2 target nucleic acids are NOT DETECTED.  The SARS-CoV-2 RNA is generally detectable in upper and lower respiratory specimens during the acute phase of infection. The lowest concentration of SARS-CoV-2 viral copies this assay can detect is 250 copies / mL. A negative result does not preclude SARS-CoV-2 infection and should not be used as the sole basis for treatment or other patient management decisions.  A negative result may occur with improper specimen collection / handling, submission of specimen other than nasopharyngeal swab, presence of viral mutation(s) within the areas targeted by this assay, and inadequate number of viral copies (<250 copies / mL). A negative result must be combined with clinical observations, patient history, and epidemiological information.  Fact Sheet for Patients:    https://www.patel.info/  Fact Sheet for Healthcare Providers: https://hall.com/  This test is not yet approved or  cleared by the [place] FDA and has been authorized for detection and/or diagnosis of SARS-CoV-2 by FDA under an Emergency Use Authorization (EUA).  This EUA will remain in effect (meaning this test can be used) for the duration of the COVID-19 declaration under Section 564(b)(1) of the Act, 21 U.S.C. section 360bbb-3(b)(1), unless the authorization is terminated or revoked sooner.  Performed at [facility], [address] [place], [place] [address]   Culture, blood (Routine X 2) w Reflex to ID Panel     Status: None   Collection Time: 07/05/22  2:19 PM   Specimen: BLOOD  Result Value Ref Range Status   Specimen Description BLOOD BLOOD RIGHT ARM  Final   Special Requests   Final    BOTTLES DRAWN AEROBIC AND ANAEROBIC Blood Culture adequate volume   Culture   Final    NO GROWTH 5 DAYS Performed at [facility], [address] [place], [place] [address]    Report Status 07/10/2022 FINAL  Final  Culture, blood (Routine X 2) w Reflex to ID Panel     Status: None   Collection Time: 07/05/22  2:24 PM   Specimen: BLOOD  Result Value Ref Range Status   Specimen Description BLOOD BLOOD LEFT ARM  Final   Special Requests   Final    BOTTLES  DRAWN AEROBIC AND ANAEROBIC Blood Culture adequate volume   Culture   Final    NO GROWTH 5 DAYS Performed at [facility], [address] [place], [place] [address]    Report Status 07/10/2022 FINAL  Final  Surgical pcr screen     Status: Abnormal   Collection Time: 07/07/22 12:08 PM   Specimen: Nasal Mucosa; Nasal Swab  Result Value Ref Range Status   MRSA, PCR NEGATIVE NEGATIVE Final   Staphylococcus aureus POSITIVE (A) NEGATIVE Final    Comment: (NOTE) The Xpert SA Assay (FDA approved for NASAL specimens in patients 89 years of age and older), is  one component of a comprehensive surveillance program. It is not intended to diagnose infection nor to guide or monitor treatment. Performed at [facility], [address] [place],  [address]     Labs: CBC: Recent Labs  Lab 07/05/22 0216 07/07/22 0952 07/08/22 0750 07/10/22 0842  WBC 6.3  6.2 8.6 6.7 6.0  NEUTROABS 4.1 6.3 4.8 4.0  HGB 10.3*  10.4* 8.9* 7.9* 8.7*  HCT 33.9*  34.8* 28.6* 25.9* 28.9*  MCV 103.4*  103.3* 100.4* 101.6* 101.0*  PLT 118*  115* 114* 113* 295   Basic Metabolic Panel: Recent Labs  Lab 07/05/22 0216 07/06/22 0412 07/07/22 0952 07/08/22 0410 07/08/22 0750 07/10/22 0842  NA 141 144 137  --  142 142  K 4.1 4.1 4.2  --  3.6 4.1  CL 108 110 104  --  111 110  CO2 26 25 22   --  22 23  GLUCOSE 166* 162* 195*  --  146* 163*  BUN 42* 35* 43*  --  57* 60*  CREATININE 2.74* 2.03* 1.96* 2.50* 2.40* 2.15*  CALCIUM 8.8* 9.1 9.0  --  8.8* 8.8*   Liver Function Tests: Recent Labs  Lab 07/05/22 0215  AST 27  ALT 12  ALKPHOS 71  BILITOT 0.9  PROT 8.1  ALBUMIN 3.3*   CBG: Recent Labs  Lab 07/09/22 1150 07/09/22 1750 07/09/22 2052 07/10/22 0848 07/10/22 1220  GLUCAP 171* 190* 242* 159* 141*    Discharge time spent: greater than 30 minutes.  Signed: Jennye Boroughs, MD [facility] 07/10/2022

## 2022-07-10 NOTE — Care Management Important Message (Signed)
Important Message  Patient Details  Name: QUINDARIUS CABELLO MRN: [id number] Date of Birth: 1956/06/30   Medicare Important Message Given:  N/A - LOS <3 / Initial given by admissions     Juliann Pulse A Keshav Winegar 07/10/2022, 9:16 AM

## 2022-07-10 NOTE — TOC Progression Note (Signed)
Transition of Care [facility]) - Progression Note    Patient Details  Name: Albert Figueroa MRN: [id number] Date of Birth: 09-12-1956  Transition of Care [facility]) CM/SW [place], RN Phone Number: 07/10/2022, 1:00 PM  Clinical Narrative:     Called and spoke to [name], I let her know he is going to room 707 at [facility], EMS called to transport there are 3-4 ahead of him on their list  Expected Discharge Plan: [place] Barriers to Discharge: Continued Medical Work up  Expected Discharge Plan and Services Expected Discharge Plan: [place] arrangements for the past 2 months: Single Family Home Expected Discharge Date: 07/10/22                                     Social Determinants of Health (SDOH) Interventions    Readmission Risk Interventions     No data to display

## 2022-09-01 ENCOUNTER — Emergency Department: Payer: Medicare Other

## 2022-09-01 ENCOUNTER — Inpatient Hospital Stay
Admission: EM | Admit: 2022-09-01 | Discharge: 2022-09-09 | DRG: 682 | Disposition: A | Discharge status: Skilled Nursing Facility | Payer: Medicare Other | Attending: Internal Medicine | Admitting: Internal Medicine

## 2022-09-01 ENCOUNTER — Other Ambulatory Visit: Payer: Self-pay

## 2022-09-01 DIAGNOSIS — E11649 Type 2 diabetes mellitus with hypoglycemia without coma: Secondary | ICD-10-CM | POA: Diagnosis not present

## 2022-09-01 DIAGNOSIS — Z888 Allergy status to other drugs, medicaments and biological substances status: Secondary | ICD-10-CM | POA: Diagnosis not present

## 2022-09-01 DIAGNOSIS — Z1152 Encounter for screening for COVID-19: Secondary | ICD-10-CM | POA: Diagnosis not present

## 2022-09-01 DIAGNOSIS — E663 Overweight: Secondary | ICD-10-CM | POA: Diagnosis present

## 2022-09-01 DIAGNOSIS — G9341 Metabolic encephalopathy: Secondary | ICD-10-CM | POA: Diagnosis present

## 2022-09-01 DIAGNOSIS — H01002 Unspecified blepharitis right lower eyelid: Secondary | ICD-10-CM | POA: Diagnosis present

## 2022-09-01 DIAGNOSIS — E1122 Type 2 diabetes mellitus with diabetic chronic kidney disease: Secondary | ICD-10-CM | POA: Diagnosis present

## 2022-09-01 DIAGNOSIS — N1832 Chronic kidney disease, stage 3b: Secondary | ICD-10-CM | POA: Diagnosis present

## 2022-09-01 DIAGNOSIS — R4182 Altered mental status, unspecified: Principal | ICD-10-CM

## 2022-09-01 DIAGNOSIS — I10 Essential (primary) hypertension: Secondary | ICD-10-CM | POA: Diagnosis present

## 2022-09-01 DIAGNOSIS — D72819 Decreased white blood cell count, unspecified: Secondary | ICD-10-CM | POA: Diagnosis present

## 2022-09-01 DIAGNOSIS — D631 Anemia in chronic kidney disease: Secondary | ICD-10-CM | POA: Diagnosis present

## 2022-09-01 DIAGNOSIS — D696 Thrombocytopenia, unspecified: Secondary | ICD-10-CM | POA: Diagnosis present

## 2022-09-01 DIAGNOSIS — Z6825 Body mass index (BMI) 25.0-25.9, adult: Secondary | ICD-10-CM

## 2022-09-01 DIAGNOSIS — N189 Chronic kidney disease, unspecified: Secondary | ICD-10-CM | POA: Diagnosis present

## 2022-09-01 DIAGNOSIS — F32A Depression, unspecified: Secondary | ICD-10-CM | POA: Diagnosis present

## 2022-09-01 DIAGNOSIS — Z79899 Other long term (current) drug therapy: Secondary | ICD-10-CM

## 2022-09-01 DIAGNOSIS — E861 Hypovolemia: Secondary | ICD-10-CM | POA: Diagnosis present

## 2022-09-01 DIAGNOSIS — E785 Hyperlipidemia, unspecified: Secondary | ICD-10-CM | POA: Diagnosis present

## 2022-09-01 DIAGNOSIS — K219 Gastro-esophageal reflux disease without esophagitis: Secondary | ICD-10-CM | POA: Diagnosis present

## 2022-09-01 DIAGNOSIS — N179 Acute kidney failure, unspecified: Secondary | ICD-10-CM | POA: Diagnosis present

## 2022-09-01 DIAGNOSIS — M1990 Unspecified osteoarthritis, unspecified site: Secondary | ICD-10-CM | POA: Diagnosis present

## 2022-09-01 DIAGNOSIS — E872 Acidosis, unspecified: Secondary | ICD-10-CM | POA: Diagnosis present

## 2022-09-01 DIAGNOSIS — G40909 Epilepsy, unspecified, not intractable, without status epilepticus: Secondary | ICD-10-CM | POA: Diagnosis present

## 2022-09-01 DIAGNOSIS — J449 Chronic obstructive pulmonary disease, unspecified: Secondary | ICD-10-CM | POA: Diagnosis present

## 2022-09-01 DIAGNOSIS — F17210 Nicotine dependence, cigarettes, uncomplicated: Secondary | ICD-10-CM | POA: Diagnosis present

## 2022-09-01 DIAGNOSIS — D61818 Other pancytopenia: Secondary | ICD-10-CM | POA: Diagnosis not present

## 2022-09-01 DIAGNOSIS — Z7984 Long term (current) use of oral hypoglycemic drugs: Secondary | ICD-10-CM

## 2022-09-01 DIAGNOSIS — I959 Hypotension, unspecified: Secondary | ICD-10-CM | POA: Diagnosis present

## 2022-09-01 DIAGNOSIS — G894 Chronic pain syndrome: Secondary | ICD-10-CM | POA: Diagnosis present

## 2022-09-01 DIAGNOSIS — R0902 Hypoxemia: Secondary | ICD-10-CM | POA: Diagnosis present

## 2022-09-01 DIAGNOSIS — I129 Hypertensive chronic kidney disease with stage 1 through stage 4 chronic kidney disease, or unspecified chronic kidney disease: Secondary | ICD-10-CM | POA: Diagnosis present

## 2022-09-01 DIAGNOSIS — E1129 Type 2 diabetes mellitus with other diabetic kidney complication: Secondary | ICD-10-CM | POA: Diagnosis present

## 2022-09-01 DIAGNOSIS — E162 Hypoglycemia, unspecified: Secondary | ICD-10-CM | POA: Diagnosis present

## 2022-09-01 DIAGNOSIS — M48061 Spinal stenosis, lumbar region without neurogenic claudication: Secondary | ICD-10-CM | POA: Diagnosis present

## 2022-09-01 DIAGNOSIS — I9589 Other hypotension: Secondary | ICD-10-CM | POA: Diagnosis not present

## 2022-09-01 DIAGNOSIS — Z833 Family history of diabetes mellitus: Secondary | ICD-10-CM

## 2022-09-01 DIAGNOSIS — I272 Pulmonary hypertension, unspecified: Secondary | ICD-10-CM | POA: Diagnosis present

## 2022-09-01 DIAGNOSIS — F418 Other specified anxiety disorders: Secondary | ICD-10-CM | POA: Diagnosis present

## 2022-09-01 DIAGNOSIS — Z7951 Long term (current) use of inhaled steroids: Secondary | ICD-10-CM

## 2022-09-01 DIAGNOSIS — J9811 Atelectasis: Secondary | ICD-10-CM | POA: Diagnosis present

## 2022-09-01 DIAGNOSIS — E870 Hyperosmolality and hypernatremia: Secondary | ICD-10-CM | POA: Diagnosis not present

## 2022-09-01 DIAGNOSIS — J439 Emphysema, unspecified: Secondary | ICD-10-CM | POA: Diagnosis present

## 2022-09-01 DIAGNOSIS — R319 Hematuria, unspecified: Secondary | ICD-10-CM | POA: Diagnosis present

## 2022-09-01 DIAGNOSIS — N281 Cyst of kidney, acquired: Secondary | ICD-10-CM | POA: Diagnosis present

## 2022-09-01 LAB — URINALYSIS, COMPLETE (UACMP) WITH MICROSCOPIC
Bacteria, UA: NONE SEEN
Bilirubin Urine: NEGATIVE
Glucose, UA: NEGATIVE mg/dL
Ketones, ur: NEGATIVE mg/dL
Leukocytes,Ua: NEGATIVE
Nitrite: NEGATIVE
Protein, ur: 30 mg/dL — AB
Specific Gravity, Urine: 1.021 (ref 1.005–1.030)
Squamous Epithelial / HPF: NONE SEEN (ref 0–5)
pH: 5 (ref 5.0–8.0)

## 2022-09-01 LAB — CBC WITH DIFFERENTIAL/PLATELET
Abs Immature Granulocytes: 0.02 10*3/uL (ref 0.00–0.07)
Basophils Absolute: 0 10*3/uL (ref 0.0–0.1)
Basophils Relative: 0 %
Eosinophils Absolute: 0.1 10*3/uL (ref 0.0–0.5)
Eosinophils Relative: 2 %
HCT: 27.1 % — ABNORMAL LOW (ref 39.0–52.0)
Hemoglobin: 8.1 g/dL — ABNORMAL LOW (ref 13.0–17.0)
Immature Granulocytes: 0 %
Lymphocytes Relative: 28 %
Lymphs Abs: 1.5 10*3/uL (ref 0.7–4.0)
MCH: 32.1 pg (ref 26.0–34.0)
MCHC: 29.9 g/dL — ABNORMAL LOW (ref 30.0–36.0)
MCV: 107.5 fL — ABNORMAL HIGH (ref 80.0–100.0)
Monocytes Absolute: 0.6 10*3/uL (ref 0.1–1.0)
Monocytes Relative: 11 %
Neutro Abs: 3 10*3/uL (ref 1.7–7.7)
Neutrophils Relative %: 59 %
Platelets: 115 10*3/uL — ABNORMAL LOW (ref 150–400)
RBC: 2.52 MIL/uL — ABNORMAL LOW (ref 4.22–5.81)
RDW: 14.6 % (ref 11.5–15.5)
WBC: 5.2 10*3/uL (ref 4.0–10.5)
nRBC: 0 % (ref 0.0–0.2)

## 2022-09-01 LAB — COMPREHENSIVE METABOLIC PANEL
ALT: 23 U/L (ref 0–44)
AST: 65 U/L — ABNORMAL HIGH (ref 15–41)
Albumin: 2.7 g/dL — ABNORMAL LOW (ref 3.5–5.0)
Alkaline Phosphatase: 61 U/L (ref 38–126)
Anion gap: 14 (ref 5–15)
BUN: 91 mg/dL — ABNORMAL HIGH (ref 8–23)
CO2: 13 mmol/L — ABNORMAL LOW (ref 22–32)
Calcium: 7.9 mg/dL — ABNORMAL LOW (ref 8.9–10.3)
Chloride: 116 mmol/L — ABNORMAL HIGH (ref 98–111)
Creatinine, Ser: 7.5 mg/dL — ABNORMAL HIGH (ref 0.61–1.24)
GFR, Estimated: 7 mL/min — ABNORMAL LOW (ref >60)
Glucose, Bld: 83 mg/dL (ref 70–99)
Potassium: 4.4 mmol/L (ref 3.5–5.1)
Sodium: 143 mmol/L (ref 135–145)
Total Bilirubin: 1.1 mg/dL (ref 0.3–1.2)
Total Protein: 6.6 g/dL (ref 6.5–8.1)

## 2022-09-01 LAB — LACTIC ACID, PLASMA
Lactic Acid, Venous: 1 mmol/L (ref 0.5–1.9)
Lactic Acid, Venous: 0.7 mmol/L (ref 0.5–1.9)

## 2022-09-01 LAB — VALPROIC ACID LEVEL: Valproic Acid Lvl: 59 ug/mL (ref 50.0–100.0)

## 2022-09-01 LAB — SARS CORONAVIRUS 2 BY RT PCR: SARS Coronavirus 2 by RT PCR: NEGATIVE

## 2022-09-01 LAB — CBG MONITORING, ED: Glucose-Capillary: 83 mg/dL (ref 70–99)

## 2022-09-01 LAB — AMMONIA: Ammonia: 27 umol/L (ref 9–35)

## 2022-09-01 MED ORDER — TRAZODONE HCL 50 MG PO TABS
25.0000 mg | ORAL_TABLET | Freq: Every evening | ORAL | Status: DC | PRN
  Administered 2022-09-06: 25 mg via ORAL
  Filled 2022-09-01: qty 1

## 2022-09-01 MED ORDER — FENOFIBRATE 160 MG PO TABS
160.0000 mg | ORAL_TABLET | Freq: Every day | ORAL | Status: DC
  Administered 2022-09-03 – 2022-09-09 (×7): 160 mg via ORAL
  Filled 2022-09-01 (×8): qty 1

## 2022-09-01 MED ORDER — LACTATED RINGERS IV BOLUS
1000.0000 mL | Freq: Once | INTRAVENOUS | Status: AC
  Administered 2022-09-01: 1000 mL via INTRAVENOUS

## 2022-09-01 MED ORDER — ROFLUMILAST 500 MCG PO TABS
250.0000 ug | ORAL_TABLET | Freq: Every day | ORAL | Status: DC
  Administered 2022-09-02 – 2022-09-08 (×7): 250 ug via ORAL
  Filled 2022-09-01 (×8): qty 1

## 2022-09-01 MED ORDER — ACETAMINOPHEN 325 MG PO TABS: 650.0000 mg | ORAL_TABLET | Freq: Four times a day (QID) | ORAL | Status: DC | PRN

## 2022-09-01 MED ORDER — MAGNESIUM HYDROXIDE 400 MG/5ML PO SUSP: 30.0000 mL | Freq: Every day | ORAL | Status: DC | PRN

## 2022-09-01 MED ORDER — LACTATED RINGERS IV SOLN: INTRAVENOUS | Status: DC

## 2022-09-01 MED ORDER — HYDRALAZINE HCL 50 MG PO TABS: 25.0000 mg | ORAL_TABLET | Freq: Three times a day (TID) | ORAL | Status: DC

## 2022-09-01 MED ORDER — LACTATED RINGERS IV BOLUS
500.0000 mL | Freq: Once | INTRAVENOUS | Status: AC
  Administered 2022-09-01: 500 mL via INTRAVENOUS

## 2022-09-01 MED ORDER — DULOXETINE HCL 30 MG PO CPEP
30.0000 mg | ORAL_CAPSULE | Freq: Every day | ORAL | Status: DC
  Administered 2022-09-03 – 2022-09-07 (×5): 30 mg via ORAL
  Filled 2022-09-01 (×6): qty 1

## 2022-09-01 MED ORDER — ONDANSETRON HCL 4 MG/2ML IJ SOLN: 4.0000 mg | Freq: Four times a day (QID) | INTRAMUSCULAR | Status: DC | PRN

## 2022-09-01 MED ORDER — LISINOPRIL-HYDROCHLOROTHIAZIDE 20-25 MG PO TABS: 1.0000 | ORAL_TABLET | Freq: Every day | ORAL | Status: DC

## 2022-09-01 MED ORDER — IPRATROPIUM-ALBUTEROL 20-100 MCG/ACT IN AERS: 1.0000 | INHALATION_SPRAY | Freq: Four times a day (QID) | RESPIRATORY_TRACT | Status: DC

## 2022-09-01 MED ORDER — TIZANIDINE HCL 4 MG PO TABS
4.0000 mg | ORAL_TABLET | Freq: Every day | ORAL | Status: DC
  Administered 2022-09-02 – 2022-09-08 (×7): 4 mg via ORAL
  Filled 2022-09-01 (×7): qty 1

## 2022-09-01 MED ORDER — DAPAGLIFLOZIN PROPANEDIOL 10 MG PO TABS
10.0000 mg | ORAL_TABLET | Freq: Every day | ORAL | Status: DC
  Filled 2022-09-01: qty 1

## 2022-09-01 MED ORDER — HYDROCHLOROTHIAZIDE 25 MG PO TABS: 25.0000 mg | ORAL_TABLET | Freq: Every day | ORAL | Status: DC

## 2022-09-01 MED ORDER — SODIUM CHLORIDE 0.9 % IV SOLN: INTRAVENOUS | Status: DC

## 2022-09-01 MED ORDER — DIVALPROEX SODIUM ER 250 MG PO TB24
1000.0000 mg | ORAL_TABLET | Freq: Two times a day (BID) | ORAL | Status: DC
  Administered 2022-09-02 – 2022-09-09 (×14): 1000 mg via ORAL
  Filled 2022-09-01 (×14): qty 4

## 2022-09-01 MED ORDER — ONDANSETRON HCL 4 MG PO TABS: 4.0000 mg | ORAL_TABLET | Freq: Four times a day (QID) | ORAL | Status: DC | PRN

## 2022-09-01 MED ORDER — ENOXAPARIN SODIUM 40 MG/0.4ML IJ SOSY: 40.0000 mg | PREFILLED_SYRINGE | INTRAMUSCULAR | Status: DC

## 2022-09-01 MED ORDER — VANCOMYCIN HCL IN DEXTROSE 1-5 GM/200ML-% IV SOLN
1000.0000 mg | Freq: Once | INTRAVENOUS | Status: AC
  Administered 2022-09-01: 1000 mg via INTRAVENOUS
  Filled 2022-09-01: qty 200

## 2022-09-01 MED ORDER — FERROUS SULFATE 325 (65 FE) MG PO TABS
325.0000 mg | ORAL_TABLET | Freq: Every day | ORAL | Status: DC
  Administered 2022-09-03 – 2022-09-09 (×7): 325 mg via ORAL
  Filled 2022-09-01 (×7): qty 1

## 2022-09-01 MED ORDER — ROSUVASTATIN CALCIUM 10 MG PO TABS
20.0000 mg | ORAL_TABLET | Freq: Every day | ORAL | Status: DC
  Administered 2022-09-02 – 2022-09-08 (×7): 20 mg via ORAL
  Filled 2022-09-01 (×3): qty 2
  Filled 2022-09-01: qty 1
  Filled 2022-09-01 (×4): qty 2

## 2022-09-01 MED ORDER — LISINOPRIL 20 MG PO TABS
20.0000 mg | ORAL_TABLET | Freq: Every day | ORAL | Status: DC
  Administered 2022-09-03 – 2022-09-09 (×6): 20 mg via ORAL
  Filled 2022-09-01 (×7): qty 1

## 2022-09-01 MED ORDER — HYDROXYZINE HCL 10 MG PO TABS: 10.0000 mg | ORAL_TABLET | Freq: Three times a day (TID) | ORAL | Status: DC | PRN

## 2022-09-01 MED ORDER — ACETAMINOPHEN 650 MG RE SUPP: 650.0000 mg | Freq: Four times a day (QID) | RECTAL | Status: DC | PRN

## 2022-09-01 MED ORDER — PANTOPRAZOLE SODIUM 40 MG PO TBEC
40.0000 mg | DELAYED_RELEASE_TABLET | Freq: Every day | ORAL | Status: DC
  Administered 2022-09-03 – 2022-09-09 (×7): 40 mg via ORAL
  Filled 2022-09-01 (×7): qty 1

## 2022-09-01 MED ORDER — CYCLOBENZAPRINE HCL 10 MG PO TABS
10.0000 mg | ORAL_TABLET | Freq: Three times a day (TID) | ORAL | Status: DC | PRN
  Administered 2022-09-06: 10 mg via ORAL
  Filled 2022-09-01: qty 1

## 2022-09-01 MED ORDER — SODIUM CHLORIDE 0.9 % IV SOLN
2.0000 g | Freq: Once | INTRAVENOUS | Status: AC
  Administered 2022-09-01: 2 g via INTRAVENOUS
  Filled 2022-09-01: qty 12.5

## 2022-09-01 NOTE — ED Notes (Signed)
This RN now assuming care of pt.  

## 2022-09-01 NOTE — ED Provider Notes (Signed)
[facility] Provider Note    Event Date/Time   First MD Initiated Contact with Patient 09/01/22 1840     (approximate)   History   Altered Mental Status   HPI  Albert Figueroa is a 66 y.o. male who presents to the emergency department today via EMS as emergency traffic secondary to altered mental status. Patient is unable to give any significant history. Per EMS report the wife told them that he is being set up for dialysis. Has not urinated today. Was then confused and weak. Tried to get up the bed and was unable so fell back into the bed. EMS noted patient to be hypoxic on arrival.   Physical Exam   Triage Vital Signs: ED Triage Vitals  Enc Vitals Group     BP 81/63     Pulse 85     Resp      Temp      Temp src      SpO2      Weight      Height      Head Circumference      [facility] Flow      Pain Score      Pain Loc      Pain Edu?      Excl. in [place]?     Most recent vital signs: There were no vitals filed for this visit.  General: Somnolent, awakens to verbal stimuli. CV:  Good peripheral perfusion. Regular rate and rhythm. Resp:  Normal effort. Lungs clear. Abd:  No distention.    ED Results / Procedures / Treatments   Labs (all labs ordered are listed, but only abnormal results are displayed) Labs Reviewed  COMPREHENSIVE METABOLIC PANEL - Abnormal; Notable for the following components:      Result Value   Chloride 116 (*)    CO2 13 (*)    BUN 91 (*)    Creatinine, Ser 7.50 (*)    Calcium 7.9 (*)    Albumin 2.7 (*)    AST 65 (*)    GFR, Estimated 7 (*)    All other components within normal limits  CBC WITH DIFFERENTIAL/PLATELET - Abnormal; Notable for the following components:   RBC 2.52 (*)    Hemoglobin 8.1 (*)    HCT 27.1 (*)    MCV 107.5 (*)    MCHC 29.9 (*)    Platelets 115 (*)    All other components within normal limits  URINALYSIS, COMPLETE (UACMP) WITH MICROSCOPIC - Abnormal; Notable for the following components:    Color, Urine AMBER (*)    APPearance CLEAR (*)    Hgb urine dipstick SMALL (*)    Protein, ur 30 (*)    All other components within normal limits  SARS CORONAVIRUS 2 BY RT PCR  CULTURE, BLOOD (ROUTINE X 2)  CULTURE, BLOOD (ROUTINE X 2) W REFLEX TO ID PANEL  LACTIC ACID, PLASMA  LACTIC ACID, PLASMA  AMMONIA  VALPROIC ACID LEVEL  BASIC METABOLIC PANEL  CBC  CBG MONITORING, ED     EKG  I, Nance Pear, attending physician, personally viewed and interpreted this EKG  EKG Time: 1843 Rate: 82 Rhythm: sinus rhythm Axis: normal Intervals: qtc 437 QRS: low voltage ST changes: no st elevation Impression: abnormal ekg    RADIOLOGY I independently interpreted and visualized the CXR. My interpretation: No pneumonia Radiology interpretation:  IMPRESSION:  Low lung volumes with mild bibasilar atelectasis and/or infiltrate.    I independently interpreted and  visualized the CT head. My interpretation: No large bleed Radiology interpretation:  IMPRESSION:  1. No acute intracranial process.  2. Unchanged position of right parietal approach ventriculostomy  catheter. No evidence of hydrocephalus.      PROCEDURES:  Critical Care performed: Yes  Procedures  CRITICAL CARE Performed by: Nance Pear   Total critical care time: 35 minutes  Critical care time was exclusive of separately billable procedures and treating other patients.  Critical care was necessary to treat or prevent imminent or life-threatening deterioration.  Critical care was time spent personally by me on the following activities: development of treatment plan with patient and/or surrogate as well as nursing, discussions with consultants, evaluation of patient's response to treatment, examination of patient, obtaining history from patient or surrogate, ordering and performing treatments and interventions, ordering and review of laboratory studies, ordering and review of radiographic studies, pulse  oximetry and re-evaluation of patient's condition.   MEDICATIONS ORDERED IN ED: Medications - No data to display   IMPRESSION / MDM / [place] / ED COURSE  I reviewed the triage vital signs and the nursing notes.                              Differential diagnosis includes, but is not limited to, infection, intracranial process, toxicology, uremia.  Patient's presentation is most consistent with acute presentation with potential threat to life or bodily function.  The patient is on the cardiac monitor to evaluate for evidence of arrhythmia and/or significant heart rate changes.  Patient presented to the emergency department today because of concern for altered mental status. On exam patient is unable to give any significant history. Was found to be hypotensive. Broad work up was initiated. Blood work notable for significant AKI. No leukocytosis or elevated lactic acid level however given hypotension patient was given broad spectrum antibiotics. Additionally IVFs were initiated. Patients blood pressure did start to elevate. Discussed with Dr. Candiss Norse with nephrology. Discussed with Dr. Sidney Ace with the hospitalist service who will plan on admission.   FINAL CLINICAL IMPRESSION(S) / ED DIAGNOSES   Final diagnoses:  Altered mental status, unspecified altered mental status type  AKI (acute kidney injury) ([place])      Note:  This document was prepared using Dragon voice recognition software and may include unintentional dictation errors.    Nance Pear, MD 09/01/22 [phone]

## 2022-09-01 NOTE — ED Notes (Signed)
Albert Figueroa [phone]

## 2022-09-01 NOTE — ED Triage Notes (Signed)
BIB [facility] from home.   Sat 88% on RA at home. Placed pt on oxygen.   Pt is diabetic. normally wearsw a dexcom. hasnt had it due to lost. unknown last BGL check.  Pt has been delerious all day. pt scheduled to start dialysis soon.  Vitals for EMS abnormal.

## 2022-09-01 NOTE — ED Notes (Signed)
Pt is not alert enough to take PO medications at this time.

## 2022-09-01 NOTE — H&P (Incomplete)
Albert Figueroa   PATIENT NAME: Albert Figueroa    MR#:  [id number]  DATE OF BIRTH:  [birth year]  DATE OF ADMISSION:  09/01/2022  PRIMARY CARE PHYSICIAN: Albert Body, MD   Patient is coming from: Home  REQUESTING/REFERRING PHYSICIAN: Nance Pear, MD  CHIEF COMPLAINT:   Chief Complaint  Patient presents with   Altered Mental Status   HISTORY OF PRESENT ILLNESS:  Albert Figueroa is a 66 y.o. Caucasian male with medical history significant for stage IIIb chronic kidney disease, hypertension, dyslipidemia, seizure disorder, osteoarthritis, lumbar stenosis and type 2 diabetes mellitus, who presented to the emergency room with acute onset of altered mental status with confusion and somnolence with hypoxemia of 88% on room air.  He usually wears a Dexcom but apparently lasted.  It was unknown when his last blood glucose level was checked.  He was slightly delirious when he came.  He was scheduled to have hemodialysis on with Albert Figueroa that he follows with.  He was a very poor historian as he would wake up briefly and answer 1 or 2 questions appropriately and falls asleep.  Most of his answers have been tangential and inappropriate.  He has been having generalized weakness.  No other history could be obtained from him.  ED Course: When he came to the ER, BP was 107/59 and later dropped to 88/54 with otherwise normal vital signs.  Labs revealed a BUN of 91 and a creatinine of 7.5 compared to 16 and 2.15 on 10/11.  CO2 is 13 and albumin 2.7 with the AST of 65.  CBC showed anemia with hemoglobin 8.1 hematocrit 27.1 close to previous levels with macrocytosis.  Lactic acid was 0.7.  COVID-19 PCR came back negative glucose was 83.  Blood cultures were drawn.   EKG as reviewed by me : EKG showed sinus rhythm with a rate of 82 with low voltage QRS. Imaging: Noncontrast head CT scan revealed no acute intra abnormalities.  It showed unchanged position of right parietal approach ventriculostomy  catheter with no evidence for hydrocephalus.  Patient was given 1.5 L bolus of IV Fehringer followed by 125 mill per hour, IV vancomycin and cefepime initially empirically given hypotension.  He will be admitted to a medical telemetry bed for further evaluation and management. PAST MEDICAL HISTORY:   Past Medical History:  Diagnosis Date   Arthritis    Diabetes mellitus without complication (HCC)    Emphysema/COPD ([place])    Hyperlipidemia    Hypertension    Kidney disease    stage 3   Lumbar stenosis    Osteoarthrosis    Seizure ([place])     PAST SURGICAL HISTORY:   Past Surgical History:  Procedure Laterality Date   KNEE SURGERY     multiple surgeries on both knees    SOCIAL HISTORY:   Social History   Tobacco Use   Smoking status: Every Day    Packs/day: 2.00    Years: 48.00    Total pack years: 96.00    Types: Cigarettes   Smokeless tobacco: Never   Tobacco comments:    has cut back to 1 PPD  Substance Use Topics   Alcohol use: No    FAMILY HISTORY:   Family History  Problem Relation Age of Onset   Diabetes Mother    Cancer Mother     DRUG ALLERGIES:   Allergies  Allergen Reactions   Gabapentin Other (See Comments)    Unknown reaction (Per Significant Other)  Amlodipine     Other reaction(s): Other (see comments) Caused elevated blood pressure and headaches    REVIEW OF SYSTEMS:   ROS As per history of present illness. All pertinent systems were reviewed above. Constitutional, HEENT, cardiovascular, respiratory, GI, GU, musculoskeletal, neuro, psychiatric, endocrine, integumentary and hematologic systems were reviewed and are otherwise negative/unremarkable except for positive findings mentioned above in the HPI.   MEDICATIONS AT HOME:   Prior to Admission medications   Medication Sig Start Date End Date Taking? Authorizing Provider  azelastine (OPTIVAR) 0.05 % ophthalmic solution Place 1 drop into both eyes 2 (two) times daily. 06/26/22  Yes  [provider]  COMBIVENT RESPIMAT 20-100 MCG/ACT AERS respimat Inhale 1 puff into the lungs 4 (four) times daily. 05/06/22  Yes [provider]  dexlansoprazole (DEXILANT) 60 MG capsule Take 60 mg by mouth daily.   Yes [provider]  divalproex (DEPAKOTE ER) 500 MG 24 hr tablet Take 1,000 mg by mouth in the morning and at bedtime.   Yes [provider]  DULoxetine (CYMBALTA) 30 MG capsule Take 30 mg by mouth daily. 07/03/22  Yes [provider]  erythromycin ophthalmic ointment Place 1 Application into both eyes at bedtime. 08/26/22  Yes [provider]  FARXIGA 10 MG TABS tablet Take 10 mg by mouth daily. 05/06/22  Yes [provider]  fenofibrate 160 MG tablet Take 160 mg by mouth daily. 04/25/22  Yes [provider]  ferrous sulfate 325 (65 FE) MG tablet Take 325 mg by mouth daily with breakfast.   Yes [provider]  hydrOXYzine (ATARAX) 10 MG tablet Take 10 mg by mouth 3 (three) times daily as needed. 08/28/22  Yes [provider]  lisinopril-hydrochlorothiazide (ZESTORETIC) 20-25 MG tablet Take 1 tablet by mouth daily. 08/24/22  Yes [provider]  Oxycodone HCl 20 MG TABS Take 1 tablet by mouth every 8 (eight) hours as needed. 08/28/22  Yes [provider]  Roflumilast (DALIRESP) 250 MCG TABS Take 250 mcg by mouth at bedtime.   Yes [provider]  rosuvastatin (CRESTOR) 20 MG tablet Take 20 mg by mouth at bedtime. 04/08/22  Yes [provider]  tiZANidine (ZANAFLEX) 4 MG tablet Take 1 tablet (4 mg total) by mouth at bedtime. 07/10/22  Yes Albert Boroughs, MD  cyclobenzaprine (FLEXERIL) 10 MG tablet Take 10 mg by mouth 3 (three) times daily as needed for muscle spasms.    [provider]  hydrALAZINE (APRESOLINE) 25 MG tablet Take 1 tablet (25 mg total) by mouth every 8 (eight) hours. Patient not taking: Reported on 09/01/2022 07/10/22   Albert Boroughs, MD   Loteprednol-Tobramycin (ZYLET) 0.5-0.3 % SUSP Place 1 drop into both eyes 4 (four) times daily. Patient not taking: Reported on 09/01/2022    [provider]  nicotine (NICODERM CQ - DOSED IN MG/24 HOURS) 21 mg/24hr patch Place 1 patch (21 mg total) onto the skin daily. Patient not taking: Reported on 09/01/2022 07/11/22   Albert Boroughs, MD  OZEMPIC, 0.25 OR 0.5 MG/DOSE, 2 MG/3ML SOPN Inject 2 mg into the skin once a week. Patient not taking: Reported on 09/01/2022 05/06/22   [provider]  torsemide (DEMADEX) 20 MG tablet Take 20 mg by mouth daily as needed for edema. 04/08/22   [provider]  TRELEGY ELLIPTA 100-62.5-25 MCG/ACT AEPB Inhale 1 puff into the lungs daily. Patient not taking: Reported on 09/01/2022 02/26/22   [provider]      VITAL SIGNS:  Blood pressure 98/67, pulse 81, temperature 98 F (36.7 C), temperature source Oral, resp. rate 20, height 5\' 11"  (1.803 m), weight 91 kg, SpO2 96 %.  PHYSICAL EXAMINATION:  Physical Exam  GENERAL:  66 y.o.-year-old patient lying in the bed with no acute distress.  He is globally confused. EYES: Pupils equal, round, reactive to light and accommodation. No scleral icterus.  Positive right lower eyelid erythema concerning for blepharitis without discharge.  Extraocular muscles intact.  HEENT: Head atraumatic, normocephalic. Oropharynx and nasopharynx clear.  NECK:  Supple, no jugular venous distention. No thyroid enlargement, no tenderness.  LUNGS: Normal breath sounds bilaterally, no wheezing, rales,rhonchi or crepitation. No use of accessory muscles of respiration.  CARDIOVASCULAR: Regular rate and rhythm, S1, S2 normal. No murmurs, rubs, or gallops.  ABDOMEN: Soft, nondistended, nontender. Bowel sounds present. No organomegaly or mass.  EXTREMITIES: No pedal edema, cyanosis, or clubbing.  NEUROLOGIC: Cranial nerves II through XII are intact. Muscle strength 5/5 in all extremities. Sensation intact.  Gait not checked.  PSYCHIATRIC: The patient is very somnolent but arousable and when aroused he is  oriented x 2 to place and person but not to time.  Flat affect active and no good eye contact. SKIN: No obvious rash, lesion, or ulcer.   LABORATORY PANEL:   CBC Recent Labs  Lab 09/01/22 1841  WBC 5.2  HGB 8.1*  HCT 27.1*  PLT 115*   ------------------------------------------------------------------------------------------------------------------  Chemistries  Recent Labs  Lab 09/01/22 1841  NA 143  K 4.4  CL 116*  CO2 13*  GLUCOSE 83  BUN 91*  CREATININE 7.50*  CALCIUM 7.9*  AST 65*  ALT 23  ALKPHOS 61  BILITOT 1.1   ------------------------------------------------------------------------------------------------------------------  Cardiac Enzymes No results for input(s): "TROPONINI" in the last 168 hours. ------------------------------------------------------------------------------------------------------------------  RADIOLOGY:  CT Head Wo Contrast  Result Date: 09/01/2022 CLINICAL DATA:  Altered mental status EXAM: CT HEAD WITHOUT CONTRAST TECHNIQUE: Contiguous axial images were obtained from the base of the skull through the vertex without intravenous contrast. RADIATION DOSE REDUCTION: This exam was performed according to the departmental dose-optimization program which includes automated exposure control, adjustment of the mA and/or kV according to patient size and/or use of iterative reconstruction technique. COMPARISON:  Head CT 07/05/2022.  MRI brain 06/15/2021. FINDINGS: Brain: No acute hemorrhage, acute infarct or acute extra-axial fluid collection identified. No midline shift or mass effect. Encephalomalacia in the right frontal lobe is unchanged. Focal left frontal lobe periventricular white matter hypodensity is unchanged. The ventricles are nondilated and stable in size. Right parietal approach ventriculostomy catheter ends near midline in the right lateral  ventricle, unchanged from prior. Basal cisterns are patent. Vascular: Atherosclerotic calcifications are present within the cavernous internal carotid arteries. Skull: Normal. Negative for fracture or focal lesion. Sinuses/Orbits: No acute finding. Other: None. IMPRESSION: 1. No acute intracranial process. 2. Unchanged position of right parietal approach ventriculostomy catheter. No evidence of hydrocephalus. Electronically Signed   By: Ronney Asters M.D.   On: 09/01/2022 22:06   DG Chest Port 1 View  Result Date: 09/01/2022 CLINICAL DATA:  Decreased oxygen saturation. EXAM: PORTABLE CHEST 1 VIEW COMPARISON:  June 15, 2021 FINDINGS: The heart size and mediastinal contours are within normal limits. Intact ventriculoperitoneal shunt tubing is seen on the right. Low lung volumes are noted with mild areas of atelectasis and/or infiltrate seen within the bilateral lung bases. There is no evidence of a pleural effusion or pneumothorax. Multilevel degenerative changes are seen throughout the thoracic spine. IMPRESSION: Low lung  volumes with mild bibasilar atelectasis and/or infiltrate. Electronically Signed   By: Virgina Norfolk M.D.   On: 09/01/2022 19:08      IMPRESSION AND PLAN:  Assessment and Plan: * Acute kidney injury superimposed on chronic kidney disease ([place]) - The Patient will be admitted to a medical telemetry bed. - This is associated with metabolic acidosis - We will continue hydration with IV normal saline. - We will follow BMP. - Nephrology consult will be obtained. - I notified Dr. Candiss Norse about the patient. - We will avoid nephrotoxins. - We will push 2 ampoules of sodium bicarbonate.  Acute metabolic encephalopathy - This likely secondary to his uremia. - Management as above. - We will follow neurochecks every 4 hours for 24 hours. - He had no respiratory symptoms to suggest pneumonia and his UA was unremarkable.  Blepharitis of right lower eyelid - We will apply  tobramycin ointment to his lower eyelid.  Hypotension - This is resolving with IV hydration. - This could be contributing to his AKI. - We will monitor BP. - I will hold off on further IV antibiotic therapy at this time while monitoring his BP.  Dyslipidemia - We will continue statin therapy and fenofibrate.  Essential hypertension - Only with improvement of his blood pressure we will continue hydralazine while holding of lisinopril HCT given acute kidney injury.  Type II diabetes mellitus with renal manifestations ([place]) - The patient will be placed on supplement coverage with NovoLog and will continue [place].  Chronic obstructive pulmonary disease (COPD) (HCC) - We will continue Trelegy and his Combivent.  GERD without esophagitis - We will continue PPI therapy.  Depression - We will continue his Cymbalta and Depakote ER.  Pulmonary hypertension ([place]) - We will continue  Daliresp.   DVT prophylaxis: Lovenox,  Advanced Care Planning:  Code Status: full code,  Family Communication:  The plan of care was discussed in details with the patient (and family). I answered all questions. The patient agreed to proceed with the above mentioned plan. Further management will depend upon hospital course. Disposition Plan: Back to previous home environment Consults called: Nephrology All the records are reviewed and case discussed with ED provider.  Status is: Inpatient    At the time of the admission, it appears that the appropriate admission status for this patient is inpatient.  This is judged to be reasonable and necessary in order to provide the required intensity of service to ensure the patient's safety given the presenting symptoms, physical exam findings and initial radiographic and laboratory data in the context of comorbid conditions.  The patient requires inpatient status due to high intensity of service, high risk of further deterioration and high frequency of surveillance  required.  I certify that at the time of admission, it is my clinical judgment that the patient will require inpatient hospital care extending more than 2 midnights.                            Dispo: The patient is from: Home              Anticipated d/c is to: Home              Patient currently is not medically stable to d/c.              Difficult to place patient: No  Christel Mormon M.D on 09/02/2022 at 2:44 AM  [facility]   From  7 PM-7 AM, contact night-coverage [email or web address]  CC: Primary care physician; Albert Body, MD

## 2022-09-02 ENCOUNTER — Inpatient Hospital Stay: Payer: Medicare Other

## 2022-09-02 DIAGNOSIS — E785 Hyperlipidemia, unspecified: Secondary | ICD-10-CM | POA: Diagnosis present

## 2022-09-02 DIAGNOSIS — I959 Hypotension, unspecified: Secondary | ICD-10-CM | POA: Diagnosis present

## 2022-09-02 DIAGNOSIS — E872 Acidosis, unspecified: Secondary | ICD-10-CM | POA: Diagnosis present

## 2022-09-02 DIAGNOSIS — N189 Chronic kidney disease, unspecified: Secondary | ICD-10-CM | POA: Diagnosis not present

## 2022-09-02 DIAGNOSIS — K219 Gastro-esophageal reflux disease without esophagitis: Secondary | ICD-10-CM | POA: Insufficient documentation

## 2022-09-02 DIAGNOSIS — F32A Depression, unspecified: Secondary | ICD-10-CM | POA: Diagnosis present

## 2022-09-02 DIAGNOSIS — H01002 Unspecified blepharitis right lower eyelid: Secondary | ICD-10-CM | POA: Diagnosis present

## 2022-09-02 DIAGNOSIS — G9341 Metabolic encephalopathy: Secondary | ICD-10-CM | POA: Diagnosis present

## 2022-09-02 DIAGNOSIS — I272 Pulmonary hypertension, unspecified: Secondary | ICD-10-CM | POA: Insufficient documentation

## 2022-09-02 DIAGNOSIS — F418 Other specified anxiety disorders: Secondary | ICD-10-CM | POA: Diagnosis present

## 2022-09-02 DIAGNOSIS — N179 Acute kidney failure, unspecified: Secondary | ICD-10-CM | POA: Diagnosis not present

## 2022-09-02 LAB — CBG MONITORING, ED
Glucose-Capillary: 88 mg/dL (ref 70–99)
Glucose-Capillary: 66 mg/dL — ABNORMAL LOW (ref 70–99)
Glucose-Capillary: 98 mg/dL (ref 70–99)
Glucose-Capillary: 104 mg/dL — ABNORMAL HIGH (ref 70–99)
Glucose-Capillary: 67 mg/dL — ABNORMAL LOW (ref 70–99)

## 2022-09-02 LAB — CBC
HCT: 29.1 % — ABNORMAL LOW (ref 39.0–52.0)
Hemoglobin: 8.8 g/dL — ABNORMAL LOW (ref 13.0–17.0)
MCH: 32.1 pg (ref 26.0–34.0)
MCHC: 30.2 g/dL (ref 30.0–36.0)
MCV: 106.2 fL — ABNORMAL HIGH (ref 80.0–100.0)
Platelets: 88 10*3/uL — ABNORMAL LOW (ref 150–400)
RBC: 2.74 MIL/uL — ABNORMAL LOW (ref 4.22–5.81)
RDW: 14.5 % (ref 11.5–15.5)
WBC: 4.1 10*3/uL (ref 4.0–10.5)
nRBC: 0 % (ref 0.0–0.2)

## 2022-09-02 LAB — BASIC METABOLIC PANEL
Anion gap: 7 (ref 5–15)
BUN: 77 mg/dL — ABNORMAL HIGH (ref 8–23)
CO2: 17 mmol/L — ABNORMAL LOW (ref 22–32)
Calcium: 8.1 mg/dL — ABNORMAL LOW (ref 8.9–10.3)
Chloride: 119 mmol/L — ABNORMAL HIGH (ref 98–111)
Creatinine, Ser: 5.53 mg/dL — ABNORMAL HIGH (ref 0.61–1.24)
GFR, Estimated: 11 mL/min — ABNORMAL LOW (ref >60)
Glucose, Bld: 70 mg/dL (ref 70–99)
Potassium: 4.1 mmol/L (ref 3.5–5.1)
Sodium: 143 mmol/L (ref 135–145)

## 2022-09-02 LAB — GLUCOSE, CAPILLARY
Glucose-Capillary: 75 mg/dL (ref 70–99)
Glucose-Capillary: 70 mg/dL (ref 70–99)
Glucose-Capillary: 74 mg/dL (ref 70–99)

## 2022-09-02 MED ORDER — SODIUM BICARBONATE 8.4 % IV SOLN
50.0000 meq | Freq: Once | INTRAVENOUS | Status: AC
  Administered 2022-09-02: 50 meq via INTRAVENOUS
  Filled 2022-09-02: qty 50

## 2022-09-02 MED ORDER — HYDROMORPHONE HCL 1 MG/ML IJ SOLN
0.5000 mg | INTRAMUSCULAR | Status: DC | PRN
  Administered 2022-09-02: 0.5 mg via INTRAVENOUS
  Filled 2022-09-02: qty 0.5

## 2022-09-02 MED ORDER — HEPARIN SODIUM (PORCINE) 5000 UNIT/ML IJ SOLN
5000.0000 [IU] | Freq: Three times a day (TID) | INTRAMUSCULAR | Status: DC
  Administered 2022-09-02 – 2022-09-09 (×22): 5000 [IU] via SUBCUTANEOUS
  Filled 2022-09-02 (×22): qty 1

## 2022-09-02 MED ORDER — IPRATROPIUM-ALBUTEROL 0.5-2.5 (3) MG/3ML IN SOLN: 3.0000 mL | RESPIRATORY_TRACT | Status: DC | PRN

## 2022-09-02 MED ORDER — DEXTROSE 50 % IV SOLN: 12.5000 g | INTRAVENOUS | Status: AC

## 2022-09-02 MED ORDER — DEXTROSE 50 % IV SOLN
INTRAVENOUS | Status: AC
  Administered 2022-09-02: 12.5 g via INTRAVENOUS
  Filled 2022-09-02: qty 50

## 2022-09-02 MED ORDER — LACTATED RINGERS IV BOLUS
500.0000 mL | Freq: Once | INTRAVENOUS | Status: AC
  Administered 2022-09-02: 500 mL via INTRAVENOUS

## 2022-09-02 MED ORDER — DEXTROSE 50 % IV SOLN
12.5000 g | INTRAVENOUS | Status: AC
  Administered 2022-09-02: 12.5 g via INTRAVENOUS
  Filled 2022-09-02: qty 50

## 2022-09-02 MED ORDER — INSULIN ASPART 100 UNIT/ML IJ SOLN: 0.0000 [IU] | Freq: Three times a day (TID) | INTRAMUSCULAR | Status: DC

## 2022-09-02 MED ORDER — INSULIN ASPART 100 UNIT/ML IJ SOLN: 0.0000 [IU] | Freq: Every day | INTRAMUSCULAR | Status: DC

## 2022-09-02 NOTE — ED Notes (Signed)
Informed RN bed assigned 

## 2022-09-02 NOTE — Assessment & Plan Note (Addendum)
Much improved, alert and oriented x 3, appropriate

## 2022-09-02 NOTE — Assessment & Plan Note (Addendum)
Continue  Daliresp.

## 2022-09-02 NOTE — Progress Notes (Signed)
Progress Note   Patient: Albert Figueroa DOB: [birth year] DOA: 09/01/2022     1 DOS: the patient was seen and examined on 09/02/2022   Brief hospital course: Albert Figueroa is a 66 y.o. Caucasian male with medical history significant for stage IIIb chronic kidney disease, hypertension, dyslipidemia, seizure disorder, osteoarthritis, lumbar stenosis and type 2 diabetes mellitus, who presented on evening of 09/01/2022 for evaluation of worsening confusion and somnolence with hypoxemia of 88% on room air.  He was unable to provide history due to being lethargic.  Wife noted no urine output day of admission.    ED course -- BP was 107/59 and later dropped to 88/54 with otherwise normal vital signs.  Labs showed severe AKI with BUN of 91, Cr 7.5 (up from 16 and 2.15 on 07/10/22).  Metabolic acidosis with bicarb 13.  CBC showed anemia with Hbg 8.1, near recent baseline.  Lactic acid was normal and Covid-19 negative.  Blood glucose was 83.  Imaging: Noncontrast head CT scan revealed no acute intra abnormalities.  It showed unchanged position of right parietal approach ventriculostomy catheter with no evidence for hydrocephalus.  Patient was admitted to medicine service with nephrology consulted.  Renal function has begun to improve with IV hydration, and urine output has been adequate.  Assessment and Plan: * Acute kidney injury superimposed on chronic kidney disease ([place]) Metabolic acidosis due to renal failure. --Nephrology following --Continue IV fluids --Avoid nephrotoxins and hypotension --Monitor BMP daily --Monitor urine output  Acute metabolic encephalopathy Likely due to renal failure and uremia. 12/4: AM pt remained lethargic, would respond only briefly --Neuro checks --Manage AKI as outlined --Delirium precautions --Avoid sedating meds  Chronic pain syndrome Involving shoulder, back, both knees.   On oxycodone at home, on hold due to AMS requiring NPO for safety,  hypotension. --PRN IV low dose dilaudid for now, if BP will tolerate  Blepharitis of right lower eyelid Tobramycin ointment to lower eyelid.  Hypotension POA, likely due to hypovolemia.   Improved with IV hydration.  No evidence of infection thus far, monitor. --Continue fluids --Maintain MAP>65 --Hold antihypertensives  Dyslipidemia Continue statin and fenofibrate.  Essential hypertension Holding lisinopril HCTZ given acute kidney injury and hypotension on admission.  Type II diabetes mellitus with renal manifestations (HCC) Sliding scale NovoLog  Hold Farxiga due to borderline glucose, avoid hypoglycemia.  Chronic obstructive pulmonary disease (COPD) (HCC) Stable, not exacerbated. Continue Trelegy equivalent and Combivent.  Metabolic acidosis See AKI  GERD without esophagitis Continue PPI.  Depression Continue Cymbalta and Depakote ER.  Pulmonary hypertension ([place]) Continue  Daliresp.        Subjective: Pt seen in the ED holding for a bed, no family present at the time.  He remains very lethargic. He woke up briefly and denied complaints, but quickly fell back to sleep. He can't stay awake to provide details or history.  Physical Exam: Vitals:   09/02/22 1430 09/02/22 1500 09/02/22 1530 09/02/22 1600  BP: (!) 86/62 (!) 86/55 114/65 (!) 92/58  Pulse: 77 77 80 80  Resp: 13 (!) 9 (!) 28 15  Temp:      TempSrc:      SpO2: 94% 94% 98% 96%  Weight:      Height:       General exam: lethargic, no acute distress HEENT: moist mucus membranes, hearing grossly normal  Respiratory system: CTAB diminished bases, no wheezes, rales or rhonchi, normal respiratory effort. Cardiovascular system: normal S1/S2, RRR.   Gastrointestinal system: soft,  mildly distended, seems non-tender. Central nervous system: lethargic, unable to examine  Extremities: no edema, normal tone Skin: dry, intact, normal temperature Psychiatry: unable to evaluate due to  lethargy/somnolence   Data Reviewed:  Notable labs --- Cr improved 5.53, Cl 119, bicarb 17, Ca 8.1, GFR 11, Hbg 8.8, platelets 88k.  CBG's 66 >> 98 >> 104 >> 11  Family Communication: None present, will attempt to call  Disposition: Status is: Inpatient Remains inpatient appropriate because: persistent renal failure and metabolic encephalopathy, on IV fluids and evaluation still underway    Planned Discharge Destination: Home    Time spent: 45 minutes  Author: Ezekiel Slocumb, DO 09/02/2022 5:07 PM  For on call review [email or web address].

## 2022-09-02 NOTE — Assessment & Plan Note (Addendum)
Initially held lisinopril HCTZ given acute kidney injury and hypotension on admission.  Blood pressure finally trending upward today, so we will slowly restart home medications

## 2022-09-02 NOTE — ED Notes (Signed)
Pt resting in bed. Vitals stable. New NS bolus started. Will continue to monitor.

## 2022-09-02 NOTE — Assessment & Plan Note (Addendum)
Metabolic acidosis due to renal failure.  Nephrology following.  Renal function has since normalized.

## 2022-09-02 NOTE — Assessment & Plan Note (Addendum)
Continue PPI ?

## 2022-09-02 NOTE — Assessment & Plan Note (Signed)
-   The patient will be placed on supplement coverage with NovoLog. - We will continue [place].

## 2022-09-02 NOTE — ED Notes (Signed)
Pt sister Jocelyn Lamer number [phone].

## 2022-09-02 NOTE — Assessment & Plan Note (Addendum)
Tobramycin ointment to lower eyelid.

## 2022-09-02 NOTE — Assessment & Plan Note (Addendum)
Sliding scale NovoLog  Hold Farxiga due to borderline glucose, avoid hypoglycemia.

## 2022-09-02 NOTE — ED Notes (Signed)
Pt's sister Olegario Shearer updated per her request.

## 2022-09-02 NOTE — Inpatient Diabetes Management (Signed)
Inpatient Diabetes Program Recommendations  AACE/ADA: New Consensus Statement on Inpatient Glycemic Control Target Ranges:  Prepandial:   less than 140 mg/dL      Peak postprandial:   less than 180 mg/dL (1-2 hours)      Critically ill patients:  140 - 180 mg/dL    Latest Reference Range & Units 09/01/22 18:38 09/02/22 00:59 09/02/22 07:49 09/02/22 08:20  Glucose-Capillary 70 - 99 mg/dL 83 88 66 (L) 98   Review of Glycemic Control  Diabetes history: DM2 Outpatient Diabetes medications: Farxiga 10 mg daily, Ozempic (not taking) Current orders for Inpatient glycemic control: Farxiga 10 mg daily, Novolog 0-15 units TID with meals, Novolog 0-5 units QHS  Inpatient Diabetes Program Recommendations:    Insulin: Please consider discontinuing Farxiga while inpatient and decrease Novolog to 0-6 units TID with meals.  Thanks, Barnie Alderman, RN, MSN, [place] Diabetes Coordinator Inpatient Diabetes Program [phone] (Team Pager from 8am to [place])

## 2022-09-02 NOTE — Hospital Course (Addendum)
Albert Figueroa is a 66 y.o. Caucasian male with medical history significant for stage IIIb chronic kidney disease, hypertension, dyslipidemia, seizure disorder, osteoarthritis, lumbar stenosis and type 2 diabetes mellitus, who presented on evening of 09/01/2022 for evaluation of worsening confusion and somnolence with hypoxemia of 88% on room air.  He was unable to provide history due to being lethargic.  Wife noted no urine output day of admission.    Found to have acute kidney injury with creatinine of 7.5.  Nephrology consulted and patient started on aggressive IV fluid resuscitation and over time, patient's renal function and mentation have continued to improve.  By 12/7, renal function back to normal.  Seen by physical therapy who are recommending skilled nursing.  Patient to go on Monday, 12/11.

## 2022-09-02 NOTE — Assessment & Plan Note (Addendum)
POA, likely due to hypovolemia.  Family has resolved and blood pressures are trending up, so we will discontinue IV fluids

## 2022-09-02 NOTE — Assessment & Plan Note (Signed)
See AKI ?

## 2022-09-02 NOTE — Assessment & Plan Note (Signed)
Involving shoulder, back, both knees.   On oxycodone at home, on hold due to AMS requiring NPO for safety, hypotension. --PRN IV low dose dilaudid for now, if BP will tolerate

## 2022-09-02 NOTE — Assessment & Plan Note (Addendum)
Continue Cymbalta and Depakote ER.

## 2022-09-02 NOTE — ED Notes (Signed)
Pt A&O to self but having a hard time answering to time, place and orientation questions and tends to fall back asleep. Pt was able to swallow some water successfully but needed assistance.

## 2022-09-02 NOTE — Progress Notes (Signed)
       CROSS COVER NOTE  NAME: KALE RONDEAU MRN: [id number] DOB : 1956/07/05 ATTENDING PHYSICIAN: Sidney Ace Arvella Merles, MD    Date of Service   09/02/2022   HPI/Events of Note   Notified of Soft BP--> 92/63 this as a downtrend from SBP in 100s-110s.  Interventions   Assessment/Plan:  500 mL bolus     This document was prepared using Dragon voice recognition software and may include unintentional dictation errors.  Neomia Glass DNP, MBA, FNP-BC Nurse Practitioner [facility] Pager [phone]

## 2022-09-02 NOTE — Evaluation (Signed)
Clinical/Bedside Swallow Evaluation Patient Details  Name: Albert Figueroa MRN: [id number] Date of Birth: April 09, 1956  Today's Date: 09/02/2022 Time: SLP Start Time (ACUTE ONLY): 53 SLP Stop Time (ACUTE ONLY): 1140 SLP Time Calculation (min) (ACUTE ONLY): 5 min  Past Medical History:  Past Medical History:  Diagnosis Date   Arthritis    Diabetes mellitus without complication ([place])    Emphysema/COPD ([place])    Hyperlipidemia    Hypertension    Kidney disease    stage 3   Lumbar stenosis    Osteoarthrosis    Seizure ([place])    Past Surgical History:  Past Surgical History:  Procedure Laterality Date   KNEE SURGERY     multiple surgeries on both knees   HPI:  Albert Figueroa is a 66 y.o. Caucasian male with medical history significant for stage IIIb chronic kidney disease, hypertension, dyslipidemia, seizure disorder, osteoarthritis, lumbar stenosis and type 2 diabetes mellitus, who presented to the emergency room with acute onset of altered mental status with confusion and somnolence with hypoxemia of 88% on room air. Found to have acute kidney injury, acute metabolic encephalopathy, and hypotension.    Assessment / Plan / Recommendation  Clinical Impression  Patient presents with increased risk for aspiration in the setting of altered mental status, cognitive impairment, and lethargy. During assessment, patient responded to voice but did not answer most questions appropriately. He was only able to give his name and birthdate, and did not answer Y/N questions appropriately. He did not follow oral-motor commands. With 1/2 teaspoon of water, pt did not make any efforts to retrieve despite cues when placed to his lips. With lips/tongue moistened, pt did not make any efforts to manipulate or swallow. Attempted to stimulate awareness by involving pt in bolus retrieval; he remained passive when cup and straw placed to lips. As patient appeared to have functional swallowing during prior admission when  seen by SLP on 07/06/22, appears mentation is greatest barrier to consuming POs at this time. Aspiration risk remains high unless pt is sufficiently alert and able to follow commands. Recommend NPO pending improvements in alertness and mental status, at which point SLP will follow up for reassessment.    SLP Visit Diagnosis: Dysphagia, oropharyngeal phase (R13.12)    Aspiration Risk  Severe aspiration risk    Diet Recommendation NPO   Medication Administration: Via alternative means    Other  Recommendations Oral Care Recommendations: Oral care QID Other Recommendations: Remove water pitcher    Recommendations for follow up therapy are one component of a multi-disciplinary discharge planning process, led by the attending physician.  Recommendations may be updated based on patient status, additional functional criteria and insurance authorization.  Follow up Recommendations Other (comment) (anticipate dysphagia will resolve with medical intervention and improvements in mentation/alertness, follow-up SLP needs are not anticipated)      Assistance Recommended at Discharge  tbd  Functional Status Assessment Patient has had a recent decline in their functional status and demonstrates the ability to make significant improvements in function in a reasonable and predictable amount of time.  Frequency and Duration min 2x/week  2 weeks       Prognosis Prognosis for Safe Diet Advancement: Good Barriers to Reach Goals: Cognitive deficits      Swallow Study   General Date of Onset: 09/01/22 HPI: Albert Figueroa is a 66 y.o. Caucasian male with medical history significant for stage IIIb chronic kidney disease, hypertension, dyslipidemia, seizure disorder, osteoarthritis, lumbar stenosis and type 2 diabetes  mellitus, who presented to the emergency room with acute onset of altered mental status with confusion and somnolence with hypoxemia of 88% on room air. Found to have acute kidney injury, acute  metabolic encephalopathy, and hypotension. Type of Study: Bedside Swallow Evaluation Previous Swallow Assessment: Evaluated during admission 06/2022 and was found to have grossly functional swallowing given status of dentition (edentulous). Diet Prior to this Study: Regular;Thin liquids Temperature Spikes Noted: No Respiratory Status: Room air History of Recent Intubation: No Behavior/Cognition: Lethargic/Drowsy;Distractible;Doesn't follow directions Oral Cavity Assessment: Dry Oral Care Completed by SLP: No (pt did not follow commands to open mouth) Oral Cavity - Dentition: Edentulous Vision:  (kept eyes closed; when opening briefly, eyes, only whites visible) Self-Feeding Abilities: Total assist Patient Positioning: Upright in bed Baseline Vocal Quality: Normal Volitional Cough: Cognitively unable to elicit Volitional Swallow: Unable to elicit    Oral/Motor/Sensory Function Overall Oral Motor/Sensory Function: Other (comment) (no focal deficits noted, however assessment limited due to pt's inability to follow commands)   Ice Chips Ice chips: Not tested   Thin Liquid Thin Liquid: Impaired Presentation: Spoon Oral Phase Impairments: Reduced labial seal;Poor awareness of bolus;Reduced lingual movement/coordination Oral Phase Functional Implications: Prolonged oral transit Pharyngeal  Phase Impairments:  (none observed)    Nectar Thick Nectar Thick Liquid: Not tested   Honey Thick Honey Thick Liquid: Not tested   Puree Puree: Not tested   Solid    Deneise Lever, MS, CCC-SLP Speech-Language Pathologist [phone]  Solid: Not tested      Aliene Altes 09/02/2022,11:58 AM

## 2022-09-02 NOTE — Progress Notes (Signed)
[facility]  ROUNDING NOTE   Subjective:   Albert Figueroa is a 66 year old male with past medical conditions including hypertension, seizure disorder, osteoarthritis, type 2 diabetes, dyslipidemia, and chronic kidney disease stage IIIb.  Patient presents to the emergency department with altered mental status and has been admitted for Acute kidney injury superimposed on chronic kidney disease ([place]) [N17.9, N18.9]  Patient is known to our practice and is followed outpatient by Dr. [name].  Patient was last seen in office in June of this year.  Patient is seen and evaluated at bedside in ED.  Patient remains altered and unable to answer questions appropriately.  No family at bedside.  Chart review used to obtain history.  Patient initially brought into the emergency room by EMS after wife called stating patient had been altered all day.  States patient is a diabetic but has not checked his blood sugars due to a broken Dexcom.  Wife is unsure when patient last checked blood sugar much less taken insulin.  Note also states wife says patient was being set up for dialysis and has not urinated that day.  Patient remained altered on ED arrival and hypoxic.  Labs on ED arrival significant for bicarb 13, BUN 91, creatinine 7.5 with GFR 7, calcium 7.9, albumin 2.7, hemoglobin 8.1.  Glucose 83.  Negative for COVID-19.  UA appears clear with little hematuria.  Blood cultures pending.  CT head shows no acute findings.  Chest x-ray shows a mild bibasilar atelectasis.Renal ultrasound shows bilateral renal cyst but no obstruction.  We have been consulted to evaluate acute kidney injury.  Objective:  Vital signs in last 24 hours:  Temp:  [97.6 F (36.4 C)-99 F (37.2 C)] 97.6 F (36.4 C) (12/04 1107) Pulse Rate:  [25-115] 77 (12/04 1430) Resp:  [11-24] 13 (12/04 1430) BP: (73-128)/(47-89) 86/62 (12/04 1430) SpO2:  [90 %-100 %] 94 % (12/04 1430) Weight:  [64.3 kg-91 kg] 91 kg (12/03 2340)  Weight  change:  Filed Weights   09/01/22 1841 09/01/22 2340  Weight: 64.3 kg 91 kg    Intake/Output: I/O last 3 completed shifts: In: 2798.7 [IV Piggyback:2798.7] Out: 800 [Urine:800]   Intake/Output this shift:  Total I/O In: -  Out: 1200 [Urine:1200]  Physical Exam: General: Somnolent  Head: Normocephalic, atraumatic.  Dry oral mucosal membranes  Eyes: Anicteric  Lungs:  Clear to auscultation, normal effort  Heart: Regular rate and rhythm  Abdomen:  Soft, nontender, nondistended  Extremities:  No peripheral edema.  Neurologic: Nonfocal, moving all four extremities  Skin: No lesions  Access: None    Basic Metabolic Panel: Recent Labs  Lab 09/01/22 1841 09/02/22 0506  NA 143 143  K 4.4 4.1  CL 116* 119*  CO2 13* 17*  GLUCOSE 83 70  BUN 91* 77*  CREATININE 7.50* 5.53*  CALCIUM 7.9* 8.1*    Liver Function Tests: Recent Labs  Lab 09/01/22 1841  AST 65*  ALT 23  ALKPHOS 61  BILITOT 1.1  PROT 6.6  ALBUMIN 2.7*   No results for input(s): "LIPASE", "AMYLASE" in the last 168 hours. Recent Labs  Lab 09/01/22 1843  AMMONIA 27    CBC: Recent Labs  Lab 09/01/22 1841 09/02/22 0506  WBC 5.2 4.1  NEUTROABS 3.0  --   HGB 8.1* 8.8*  HCT 27.1* 29.1*  MCV 107.5* 106.2*  PLT 115* 88*    Cardiac Enzymes: No results for input(s): "CKTOTAL", "CKMB", "CKMBINDEX", "TROPONINI" in the last 168 hours.  BNP: Invalid input(s): "  POCBNP"  CBG: Recent Labs  Lab 09/01/22 1838 09/02/22 0059 09/02/22 0749 09/02/22 0820 09/02/22 1110  GLUCAP 83 88 66* 98 104*    Microbiology: Results for orders placed or performed during the hospital encounter of 09/01/22  Blood Culture (routine x 2)     Status: None (Preliminary result)   Collection Time: 09/01/22  6:41 PM   Specimen: BLOOD  Result Value Ref Range Status   Specimen Description BLOOD BLOOD LEFT ARM  Final   Special Requests   Final    BOTTLES DRAWN AEROBIC AND ANAEROBIC Blood Culture adequate volume   Culture    Final    NO GROWTH < 12 HOURS Performed at [facility], [address], [place], [place] [address]    Report Status PENDING  Incomplete  SARS Coronavirus 2 by RT PCR (hospital order, performed in [place] hospital lab) *cepheid single result test* Anterior Nasal Swab     Status: None   Collection Time: 09/01/22  6:42 PM   Specimen: Anterior Nasal Swab  Result Value Ref Range Status   SARS Coronavirus 2 by RT PCR NEGATIVE NEGATIVE Final    Comment: (NOTE) SARS-CoV-2 target nucleic acids are NOT DETECTED.  The SARS-CoV-2 RNA is generally detectable in upper and lower respiratory specimens during the acute phase of infection. The lowest concentration of SARS-CoV-2 viral copies this assay can detect is 250 copies / mL. A negative result does not preclude SARS-CoV-2 infection and should not be used as the sole basis for treatment or other patient management decisions.  A negative result may occur with improper specimen collection / handling, submission of specimen other than nasopharyngeal swab, presence of viral mutation(s) within the areas targeted by this assay, and inadequate number of viral copies (<250 copies / mL). A negative result must be combined with clinical observations, patient history, and epidemiological information.  Fact Sheet for Patients:   https://www.patel.info/  Fact Sheet for Healthcare Providers: https://hall.com/  This test is not yet approved or  cleared by the [place] FDA and has been authorized for detection and/or diagnosis of SARS-CoV-2 by FDA under an Emergency Use Authorization (EUA).  This EUA will remain in effect (meaning this test can be used) for the duration of the COVID-19 declaration under Section 564(b)(1) of the Act, 21 U.S.C. section 360bbb-3(b)(1), unless the authorization is terminated or revoked sooner.  Performed at [facility], [address]  [place], [place] [address]     Coagulation Studies: No results for input(s): "LABPROT", "INR" in the last 72 hours.  Urinalysis: Recent Labs    09/01/22 1920  COLORURINE AMBER*  LABSPEC 1.021  PHURINE 5.0  GLUCOSEU NEGATIVE  HGBUR SMALL*  BILIRUBINUR NEGATIVE  KETONESUR NEGATIVE  PROTEINUR 30*  NITRITE NEGATIVE  LEUKOCYTESUR NEGATIVE      Imaging: US RENAL  Result Date: 09/02/2022 CLINICAL DATA:  Acute kidney injury EXAM: RENAL / URINARY TRACT ULTRASOUND COMPLETE COMPARISON:  None Available. FINDINGS: Right Kidney: Renal measurements: 11.1 x 6.3 x 5.4 cm = volume: 197.15 mL. Echogenicity within normal limits. No solid mass or hydronephrosis visualized. 7.2 x 7.7 x 6.8 cm right upper pole renal cyst. 3.8 x 3.3 x 3.8 cm right lower pole renal cyst. Left Kidney: Renal measurements: 11.1 x 6.5 x 6.4 cm = volume: 241.65 mL. Echogenicity within normal limits. No solid mass or hydronephrosis visualized. 3.8 x 3.4 x 3.6 cm left upper pole renal cyst. Bladder: Appears normal for degree of bladder distention. Other: None. IMPRESSION: 1. No  obstructive uropathy. 2. Bilateral renal cysts. Electronically Signed   By: Kathreen Devoid M.D.   On: 09/02/2022 10:51   CT Head Wo Contrast  Result Date: 09/01/2022 CLINICAL DATA:  Altered mental status EXAM: CT HEAD WITHOUT CONTRAST TECHNIQUE: Contiguous axial images were obtained from the base of the skull through the vertex without intravenous contrast. RADIATION DOSE REDUCTION: This exam was performed according to the departmental dose-optimization program which includes automated exposure control, adjustment of the mA and/or kV according to patient size and/or use of iterative reconstruction technique. COMPARISON:  Head CT 07/05/2022.  MRI brain 06/15/2021. FINDINGS: Brain: No acute hemorrhage, acute infarct or acute extra-axial fluid collection identified. No midline shift or mass effect. Encephalomalacia in the right frontal lobe is unchanged. Focal left  frontal lobe periventricular white matter hypodensity is unchanged. The ventricles are nondilated and stable in size. Right parietal approach ventriculostomy catheter ends near midline in the right lateral ventricle, unchanged from prior. Basal cisterns are patent. Vascular: Atherosclerotic calcifications are present within the cavernous internal carotid arteries. Skull: Normal. Negative for fracture or focal lesion. Sinuses/Orbits: No acute finding. Other: None. IMPRESSION: 1. No acute intracranial process. 2. Unchanged position of right parietal approach ventriculostomy catheter. No evidence of hydrocephalus. Electronically Signed   By: Ronney Asters M.D.   On: 09/01/2022 22:06   DG Chest Port 1 View  Result Date: 09/01/2022 CLINICAL DATA:  Decreased oxygen saturation. EXAM: PORTABLE CHEST 1 VIEW COMPARISON:  June 15, 2021 FINDINGS: The heart size and mediastinal contours are within normal limits. Intact ventriculoperitoneal shunt tubing is seen on the right. Low lung volumes are noted with mild areas of atelectasis and/or infiltrate seen within the bilateral lung bases. There is no evidence of a pleural effusion or pneumothorax. Multilevel degenerative changes are seen throughout the thoracic spine. IMPRESSION: Low lung volumes with mild bibasilar atelectasis and/or infiltrate. Electronically Signed   By: Virgina Norfolk M.D.   On: 09/01/2022 19:08     Medications:    sodium chloride 125 mL/hr at 09/02/22 1555   lactated ringers Stopped (09/01/22 2335)    divalproex  1,000 mg Oral BID   DULoxetine  30 mg Oral Daily   fenofibrate  160 mg Oral Daily   ferrous sulfate  325 mg Oral Q breakfast   heparin injection (subcutaneous)  5,000 Units Subcutaneous Q8H   lisinopril  20 mg Oral Daily   And   hydrochlorothiazide  25 mg Oral Daily   insulin aspart  0-15 Units Subcutaneous TID WC   insulin aspart  0-5 Units Subcutaneous QHS   pantoprazole  40 mg Oral Daily   roflumilast  250 mcg Oral  QHS   rosuvastatin  20 mg Oral QHS   tiZANidine  4 mg Oral QHS   acetaminophen **OR** acetaminophen, cyclobenzaprine, hydrOXYzine, ipratropium-albuterol, magnesium hydroxide, ondansetron **OR** ondansetron (ZOFRAN) IV, traZODone  Assessment/ Plan:  Mr. Albert Figueroa is a 66 y.o.  male with past medical conditions including hypertension, seizure disorder, osteoarthritis, type 2 diabetes, dyslipidemia, and chronic kidney disease stage IIIb.  Patient presents to the emergency department with altered mental status and has been admitted for Acute kidney injury superimposed on chronic kidney disease ([place]) [N17.9, N18.9]    Acute Kidney Injury on chronic kidney disease stage 3B with baseline creatinine 2.15 and GFR of 33 on 07/10/22.  Acute kidney injury appears to be multifactorial from diuresis, hypotension, and hypovolemia Chronic kidney disease is secondary to diabetes and hypertension.  Renal ultrasound negative for obstruction.  No  IV contrast exposure.  Creatinine has improved slightly with IV fluids.  Patient appears hypovolemic, agree with IV fluids.  Adequate urine output noted.  No acute indication for dialysis at this time.  Continue to avoid nephrotoxic agents and therapies, if possible.  Avoid hypotension.  We will continue to monitor renal function.  Lab Results  Component Value Date   CREATININE 5.53 (H) 09/02/2022   CREATININE 7.50 (H) 09/01/2022   CREATININE 2.15 (H) 07/10/2022    Intake/Output Summary (Last 24 hours) at 09/02/2022 1603 Last data filed at 09/02/2022 1103 Gross per 24 hour  Intake 2798.71 ml  Output 2000 ml  Net 798.71 ml   2. Anemia of chronic kidney disease Lab Results  Component Value Date   HGB 8.8 (L) 09/02/2022    Hemoglobin just below desired target.  We will continue to monitor.  3.  Hypertension with chronic kidney disease.  Home regimen includes lisinopril-hydrochlorothiazide, hydralazine, and torsemide.  All diuretics currently held.  4.  Diabetes mellitus type II with chronic kidney disease/renal manifestations: noninsulin dependent. Home regimen includes [place]. Most recent hemoglobin A1c is 6.7 on 01/11/2022.     LOS: 1   12/4/20234:03 PM

## 2022-09-02 NOTE — ED Notes (Addendum)
Pt BS 66. Pt hypoglycemia standing orders applied. D50 given. (See [facility]) MD aware.

## 2022-09-02 NOTE — Assessment & Plan Note (Addendum)
-   Continue statin and fenofibrate 

## 2022-09-02 NOTE — Assessment & Plan Note (Addendum)
Stable, not exacerbated. Continue Trelegy equivalent and Combivent.

## 2022-09-02 NOTE — ED Notes (Signed)
Pt is still oriented to himself and knows he is at a hospital. CBG was 104 so 1200 insulin so none given due to order parameters. Vitals stable. Will continue to monitor.

## 2022-09-03 ENCOUNTER — Other Ambulatory Visit: Payer: Self-pay

## 2022-09-03 ENCOUNTER — Encounter: Payer: Self-pay | Admitting: Family Medicine

## 2022-09-03 DIAGNOSIS — G894 Chronic pain syndrome: Secondary | ICD-10-CM

## 2022-09-03 DIAGNOSIS — G9341 Metabolic encephalopathy: Secondary | ICD-10-CM | POA: Diagnosis not present

## 2022-09-03 DIAGNOSIS — E861 Hypovolemia: Secondary | ICD-10-CM

## 2022-09-03 DIAGNOSIS — I9589 Other hypotension: Secondary | ICD-10-CM

## 2022-09-03 DIAGNOSIS — N179 Acute kidney failure, unspecified: Secondary | ICD-10-CM | POA: Diagnosis not present

## 2022-09-03 DIAGNOSIS — E162 Hypoglycemia, unspecified: Secondary | ICD-10-CM | POA: Diagnosis present

## 2022-09-03 LAB — GLUCOSE, CAPILLARY
Glucose-Capillary: 81 mg/dL (ref 70–99)
Glucose-Capillary: 74 mg/dL (ref 70–99)
Glucose-Capillary: 91 mg/dL (ref 70–99)
Glucose-Capillary: 91 mg/dL (ref 70–99)
Glucose-Capillary: 81 mg/dL (ref 70–99)

## 2022-09-03 LAB — CBC
HCT: 30.6 % — ABNORMAL LOW (ref 39.0–52.0)
Hemoglobin: 9.3 g/dL — ABNORMAL LOW (ref 13.0–17.0)
MCH: 31.6 pg (ref 26.0–34.0)
MCHC: 30.4 g/dL (ref 30.0–36.0)
MCV: 104.1 fL — ABNORMAL HIGH (ref 80.0–100.0)
Platelets: 103 10*3/uL — ABNORMAL LOW (ref 150–400)
RBC: 2.94 MIL/uL — ABNORMAL LOW (ref 4.22–5.81)
RDW: 14.2 % (ref 11.5–15.5)
WBC: 3.9 10*3/uL — ABNORMAL LOW (ref 4.0–10.5)
nRBC: 0 % (ref 0.0–0.2)

## 2022-09-03 LAB — BASIC METABOLIC PANEL
Anion gap: 6 (ref 5–15)
BUN: 56 mg/dL — ABNORMAL HIGH (ref 8–23)
CO2: 19 mmol/L — ABNORMAL LOW (ref 22–32)
Calcium: 8.7 mg/dL — ABNORMAL LOW (ref 8.9–10.3)
Chloride: 120 mmol/L — ABNORMAL HIGH (ref 98–111)
Creatinine, Ser: 3.52 mg/dL — ABNORMAL HIGH (ref 0.61–1.24)
GFR, Estimated: 18 mL/min — ABNORMAL LOW (ref >60)
Glucose, Bld: 88 mg/dL (ref 70–99)
Potassium: 4.3 mmol/L (ref 3.5–5.1)
Sodium: 145 mmol/L (ref 135–145)

## 2022-09-03 LAB — HEMOGLOBIN A1C
Hgb A1c MFr Bld: 5 % (ref 4.8–5.6)
Mean Plasma Glucose: 97 mg/dL

## 2022-09-03 LAB — MAGNESIUM: Magnesium: 2.5 mg/dL — ABNORMAL HIGH (ref 1.7–2.4)

## 2022-09-03 MED ORDER — OXYCODONE HCL 5 MG PO TABS
20.0000 mg | ORAL_TABLET | Freq: Three times a day (TID) | ORAL | Status: DC | PRN
  Administered 2022-09-06 – 2022-09-09 (×8): 20 mg via ORAL
  Filled 2022-09-03 (×8): qty 4

## 2022-09-03 NOTE — Progress Notes (Addendum)
Progress Note   Patient: Albert Figueroa GBT:[id number] DOB: Dec 21, 1955 DOA: 09/01/2022     2 DOS: the patient was seen and examined on 09/03/2022   Brief hospital course: [name] is a 66 y.o. Caucasian male with medical history significant for stage IIIb chronic kidney disease, hypertension, dyslipidemia, seizure disorder, osteoarthritis, lumbar stenosis and type 2 diabetes mellitus, who presented on evening of 09/01/2022 for evaluation of worsening confusion and somnolence with hypoxemia of 88% on room air.  He was unable to provide history due to being lethargic.  Wife noted no urine output day of admission.    ED course -- BP was 107/59 and later dropped to 88/54 with otherwise normal vital signs.  Labs showed severe AKI with BUN of 91, Cr 7.5 (up from 16 and 2.15 on 07/10/22).  Metabolic acidosis with bicarb 13.  CBC showed anemia with Hbg 8.1, near recent baseline.  Lactic acid was normal and Covid-19 negative.  Blood glucose was 83.  Imaging: Noncontrast head CT scan revealed no acute intra abnormalities.  It showed unchanged position of right parietal approach ventriculostomy catheter with no evidence for hydrocephalus.  Patient was admitted to medicine service with nephrology consulted.  Renal function has begun to improve with IV hydration, and urine output has been adequate.  Assessment and Plan: * Acute kidney injury superimposed on chronic kidney disease ([place]) Metabolic acidosis due to renal failure. 12/5 Renal function improving with IV fluids --Nephrology following --Continue IV fluids --Avoid nephrotoxins and hypotension --Monitor BMP daily --Monitor urine output  Acute metabolic encephalopathy Likely due to renal failure and uremia. 12/4: AM pt remained lethargic, would respond only briefly 12/5: awake, oriented to self only, no recall of events prior to admission --Neuro checks --Manage AKI as outlined --Delirium precautions --Avoid sedating meds  Chronic pain  syndrome Involving shoulder, back, both knees.   --Resume home oxycodone with BP hold parameter, also hold if pt lethargic  Blepharitis of right lower eyelid Tobramycin ointment to lower eyelid.  Hypotension POA, likely due to hypovolemia.   Improved with IV hydration.  No evidence of infection thus far, monitor. --Continue fluids --Maintain MAP>65 --Hold antihypertensives  Dyslipidemia Continue statin and fenofibrate.  Essential hypertension Holding lisinopril HCTZ given acute kidney injury and hypotension on admission.  Type II diabetes mellitus with renal manifestations (HCC) Resume sliding scale when indicated, d/c'd due to hypoglycemia and borderline CBG's Hold Farxiga due to borderline glucose, avoid hypoglycemia.  Chronic obstructive pulmonary disease (COPD) (HCC) Stable, not exacerbated. Continue Trelegy equivalent and Combivent.  Hypoglycemia Due to AMS/lethargy and NPO status on admission.   --Hypoglycemia protocol --[place] and sliding scale coverage on hold  Metabolic acidosis See AKI  GERD without esophagitis Continue PPI.  Depression Continue Cymbalta and Depakote ER.  Pulmonary hypertension ([place]) Continue  Daliresp.        Subjective: Pt seen in the ED holding for a bed, no family present at the time.  He remains very lethargic. He woke up briefly and denied complaints, but quickly fell back to sleep. He can't stay awake to provide details or history.  Physical Exam: Vitals:   09/02/22 1841 09/02/22 2350 09/03/22 0729 09/03/22 0828  BP: 124/72 112/74 (!) 94/58 (!) 124/93  Pulse: 90 88 69 88  Resp: 18 18 18 18   Temp: 99 F (37.2 C) 98.5 F (36.9 C) 98 F (36.7 C) 98.4 F (36.9 C)  TempSrc:  Oral Oral Oral  SpO2: 99% 99% 100% 100%  Weight:  Height:       General exam: awake, alert, remains confused, no acute distress HEENT: moist mucus membranes, hearing grossly normal  Respiratory system: CTAB diminished bases, no wheezes, rales  or rhonchi, normal respiratory effort. Cardiovascular system: normal S1/S2, RRR, no peripheral edema.   Gastrointestinal system: soft, non-tender. Central nervous system: alert, oriented to self only, , unable to examine  Extremities: no edema, normal tone Skin: dry, intact, normal temperature Psychiatry: normal mood, flat affect, abnormal judgment and insight   Data Reviewed:  Notable labs --- Cr improved 3.52, Cl 120, bicarb 19, Ca 8.8, mg 2.5, GFR 18.  WBC 3.6, Hbg 9.3, platelets 103k   Family Communication: None at bedside.  Called sister Olegario Shearer this afternoon but no answer.  Disposition: Status is: Inpatient Remains inpatient appropriate because: persistent renal failure and metabolic encephalopathy, on IV fluids and evaluation still underway    Planned Discharge Destination: Home    Time spent: 45 minutes  Author: Ezekiel Slocumb, DO 09/03/2022 1:54 PM  For on call review [email or web address].

## 2022-09-03 NOTE — Assessment & Plan Note (Signed)
Due to AMS/lethargy and NPO status on admission.   --Hypoglycemia protocol --Albert Figueroa and sliding scale coverage on hold

## 2022-09-03 NOTE — Progress Notes (Signed)
[facility]  ROUNDING NOTE   Subjective:   Albert Figueroa is a 66 year old male with past medical conditions including hypertension, seizure disorder, osteoarthritis, type 2 diabetes, dyslipidemia, and chronic kidney disease stage IIIb.  Patient presents to the emergency department with altered mental status and has been admitted for AKI (acute kidney injury) ([place]) [N17.9] Altered mental status, unspecified altered mental status type [R41.82] Acute kidney injury superimposed on chronic kidney disease ([place]) [N17.9, N18.9]  Patient is known to our practice and is followed outpatient by Dr. Holley Raring.  Patient was last seen in office in June of this year.   Patient seen laying in bed, eyes closed Patient opens eyes and responsive to name.  Patient able to answer simple questions.  Delayed response.  Remains on room air.  Poor appetite. No lower extremity edema IV fluids remain  Objective:  Vital signs in last 24 hours:  Temp:  [98 F (36.7 C)-99 F (37.2 C)] 98.1 F (36.7 C) (12/05 1525) Pulse Rate:  [66-94] 66 (12/05 1525) Resp:  [15-18] 18 (12/05 1525) BP: (92-124)/(58-93) 124/61 (12/05 1525) SpO2:  [96 %-100 %] 100 % (12/05 1525)  Weight change:  Filed Weights   09/01/22 1841 09/01/22 2340  Weight: 64.3 kg 91 kg    Intake/Output: I/O last 3 completed shifts: In: 5278.9 [I.V.:2480.1; IV Piggyback:2798.7] Out: 2700 [Urine:2700]   Intake/Output this shift:  No intake/output data recorded.  Physical Exam: General: NAD  Head: Normocephalic, atraumatic.  Dry oral mucosal membranes  Eyes: Anicteric  Lungs:  Clear to auscultation, normal effort  Heart: Regular rate and rhythm  Abdomen:  Soft, nontender, nondistended  Extremities:  No peripheral edema.  Neurologic: Alert, oriented to self and place, moving all four extremities  Skin: No lesions  Access: None    Basic Metabolic Panel: Recent Labs  Lab 09/01/22 1841 09/02/22 0506 09/03/22 0220  NA 143 143  145  K 4.4 4.1 4.3  CL 116* 119* 120*  CO2 13* 17* 19*  GLUCOSE 83 70 88  BUN 91* 77* 56*  CREATININE 7.50* 5.53* 3.52*  CALCIUM 7.9* 8.1* 8.7*  MG  --   --  2.5*     Liver Function Tests: Recent Labs  Lab 09/01/22 1841  AST 65*  ALT 23  ALKPHOS 61  BILITOT 1.1  PROT 6.6  ALBUMIN 2.7*    No results for input(s): "LIPASE", "AMYLASE" in the last 168 hours. Recent Labs  Lab 09/01/22 1843  AMMONIA 27     CBC: Recent Labs  Lab 09/01/22 1841 09/02/22 0506 09/03/22 0220  WBC 5.2 4.1 3.9*  NEUTROABS 3.0  --   --   HGB 8.1* 8.8* 9.3*  HCT 27.1* 29.1* 30.6*  MCV 107.5* 106.2* 104.1*  PLT 115* 88* 103*     Cardiac Enzymes: No results for input(s): "CKTOTAL", "CKMB", "CKMBINDEX", "TROPONINI" in the last 168 hours.  BNP: Invalid input(s): "POCBNP"  CBG: Recent Labs  Lab 09/02/22 2207 09/02/22 2312 09/03/22 0229 09/03/22 0726 09/03/22 1136  GLUCAP 70 74 81 74 91     Microbiology: Results for orders placed or performed during the hospital encounter of 09/01/22  Blood Culture (routine x 2)     Status: None (Preliminary result)   Collection Time: 09/01/22  6:41 PM   Specimen: BLOOD  Result Value Ref Range Status   Specimen Description BLOOD BLOOD LEFT ARM  Final   Special Requests   Final    BOTTLES DRAWN AEROBIC AND ANAEROBIC Blood Culture adequate volume  Culture   Final    NO GROWTH 2 DAYS Performed at [facility], [address] [place], [place] [address]    Report Status PENDING  Incomplete  SARS Coronavirus 2 by RT PCR (hospital order, performed in [facility] hospital lab) *cepheid single result test* Anterior Nasal Swab     Status: None   Collection Time: 09/01/22  6:42 PM   Specimen: Anterior Nasal Swab  Result Value Ref Range Status   SARS Coronavirus 2 by RT PCR NEGATIVE NEGATIVE Final    Comment: (NOTE) SARS-CoV-2 target nucleic acids are NOT DETECTED.  The SARS-CoV-2 RNA is generally detectable in upper and  lower respiratory specimens during the acute phase of infection. The lowest concentration of SARS-CoV-2 viral copies this assay can detect is 250 copies / mL. A negative result does not preclude SARS-CoV-2 infection and should not be used as the sole basis for treatment or other patient management decisions.  A negative result may occur with improper specimen collection / handling, submission of specimen other than nasopharyngeal swab, presence of viral mutation(s) within the areas targeted by this assay, and inadequate number of viral copies (<250 copies / mL). A negative result must be combined with clinical observations, patient history, and epidemiological information.  Fact Sheet for Patients:   https://www.patel.info/  Fact Sheet for Healthcare Providers: https://hall.com/  This test is not yet approved or  cleared by the [place] FDA and has been authorized for detection and/or diagnosis of SARS-CoV-2 by FDA under an Emergency Use Authorization (EUA).  This EUA will remain in effect (meaning this test can be used) for the duration of the COVID-19 declaration under Section 564(b)(1) of the Act, 21 U.S.C. section 360bbb-3(b)(1), unless the authorization is terminated or revoked sooner.  Performed at [facility], [address] [place], [place] [address]   Culture, blood (Routine X 2) w Reflex to ID Panel     Status: None (Preliminary result)   Collection Time: 09/03/22  2:20 AM   Specimen: BLOOD  Result Value Ref Range Status   Specimen Description BLOOD BLOOD RIGHT ARM  Final   Special Requests   Final    IN BOTH AEROBIC AND ANAEROBIC BOTTLES Blood Culture adequate volume   Culture   Final    NO GROWTH < 12 HOURS Performed at [facility], [address], [place], [place] [address]    Report Status PENDING  Incomplete    Coagulation Studies: No results for input(s): "LABPROT", "INR" in the last 72  hours.  Urinalysis: Recent Labs    09/01/22 1920  COLORURINE AMBER*  LABSPEC 1.021  PHURINE 5.0  GLUCOSEU NEGATIVE  HGBUR SMALL*  BILIRUBINUR NEGATIVE  KETONESUR NEGATIVE  PROTEINUR 30*  NITRITE NEGATIVE  LEUKOCYTESUR NEGATIVE       Imaging: US RENAL  Result Date: 09/02/2022 CLINICAL DATA:  Acute kidney injury EXAM: RENAL / URINARY TRACT ULTRASOUND COMPLETE COMPARISON:  None Available. FINDINGS: Right Kidney: Renal measurements: 11.1 x 6.3 x 5.4 cm = volume: 197.15 mL. Echogenicity within normal limits. No solid mass or hydronephrosis visualized. 7.2 x 7.7 x 6.8 cm right upper pole renal cyst. 3.8 x 3.3 x 3.8 cm right lower pole renal cyst. Left Kidney: Renal measurements: 11.1 x 6.5 x 6.4 cm = volume: 241.65 mL. Echogenicity within normal limits. No solid mass or hydronephrosis visualized. 3.8 x 3.4 x 3.6 cm left upper pole renal cyst. Bladder: Appears normal for degree of bladder distention. Other: None. IMPRESSION: 1. No obstructive uropathy.  2. Bilateral renal cysts. Electronically Signed   By: Kathreen Devoid M.D.   On: 09/02/2022 10:51   CT Head Wo Contrast  Result Date: 09/01/2022 CLINICAL DATA:  Altered mental status EXAM: CT HEAD WITHOUT CONTRAST TECHNIQUE: Contiguous axial images were obtained from the base of the skull through the vertex without intravenous contrast. RADIATION DOSE REDUCTION: This exam was performed according to the departmental dose-optimization program which includes automated exposure control, adjustment of the mA and/or kV according to patient size and/or use of iterative reconstruction technique. COMPARISON:  Head CT 07/05/2022.  MRI brain 06/15/2021. FINDINGS: Brain: No acute hemorrhage, acute infarct or acute extra-axial fluid collection identified. No midline shift or mass effect. Encephalomalacia in the right frontal lobe is unchanged. Focal left frontal lobe periventricular white matter hypodensity is unchanged. The ventricles are nondilated and stable in  size. Right parietal approach ventriculostomy catheter ends near midline in the right lateral ventricle, unchanged from prior. Basal cisterns are patent. Vascular: Atherosclerotic calcifications are present within the cavernous internal carotid arteries. Skull: Normal. Negative for fracture or focal lesion. Sinuses/Orbits: No acute finding. Other: None. IMPRESSION: 1. No acute intracranial process. 2. Unchanged position of right parietal approach ventriculostomy catheter. No evidence of hydrocephalus. Electronically Signed   By: Ronney Asters M.D.   On: 09/01/2022 22:06   DG Chest Port 1 View  Result Date: 09/01/2022 CLINICAL DATA:  Decreased oxygen saturation. EXAM: PORTABLE CHEST 1 VIEW COMPARISON:  June 15, 2021 FINDINGS: The heart size and mediastinal contours are within normal limits. Intact ventriculoperitoneal shunt tubing is seen on the right. Low lung volumes are noted with mild areas of atelectasis and/or infiltrate seen within the bilateral lung bases. There is no evidence of a pleural effusion or pneumothorax. Multilevel degenerative changes are seen throughout the thoracic spine. IMPRESSION: Low lung volumes with mild bibasilar atelectasis and/or infiltrate. Electronically Signed   By: Virgina Norfolk M.D.   On: 09/01/2022 19:08     Medications:    sodium chloride 125 mL/hr at 09/03/22 1126    divalproex  1,000 mg Oral BID   DULoxetine  30 mg Oral Daily   fenofibrate  160 mg Oral Daily   ferrous sulfate  325 mg Oral Q breakfast   heparin injection (subcutaneous)  5,000 Units Subcutaneous Q8H   lisinopril  20 mg Oral Daily   pantoprazole  40 mg Oral Daily   roflumilast  250 mcg Oral QHS   rosuvastatin  20 mg Oral QHS   tiZANidine  4 mg Oral QHS   acetaminophen **OR** acetaminophen, cyclobenzaprine, hydrOXYzine, ipratropium-albuterol, magnesium hydroxide, ondansetron **OR** ondansetron (ZOFRAN) IV, oxyCODONE, traZODone  Assessment/ Plan:  Mr. Albert Figueroa is a 66 y.o.   male with past medical conditions including hypertension, seizure disorder, osteoarthritis, type 2 diabetes, dyslipidemia, and chronic kidney disease stage IIIb.  Patient presents to the emergency department with altered mental status and has been admitted for AKI (acute kidney injury) ([place]) [N17.9] Altered mental status, unspecified altered mental status type [R41.82] Acute kidney injury superimposed on chronic kidney disease ([place]) [N17.9, N18.9]    Acute Kidney Injury on chronic kidney disease stage 3B with baseline creatinine 2.15 and GFR of 33 on 07/10/22.  Acute kidney injury appears to be multifactorial from diuresis, hypotension, and hypovolemia Chronic kidney disease is secondary to diabetes and hypertension.  Renal ultrasound negative for obstruction.  No IV contrast exposure.    Creatinine continues to improve with IV fluids.  Adequate urine output noted.  Continue IV fluids for  now.  No acute need for dialysis.  Continue to avoid nephrotoxic agents and therapies.  Lab Results  Component Value Date   CREATININE 3.52 (H) 09/03/2022   CREATININE 5.53 (H) 09/02/2022   CREATININE 7.50 (H) 09/01/2022    Intake/Output Summary (Last 24 hours) at 09/03/2022 1548 Last data filed at 09/03/2022 0500 Gross per 24 hour  Intake 2480.14 ml  Output 700 ml  Net 1780.14 ml    2. Anemia of chronic kidney disease Lab Results  Component Value Date   HGB 9.3 (L) 09/03/2022    Hemoglobin at goal.  We will continue to monitor.  3.  Hypertension with chronic kidney disease.  Home regimen includes lisinopril-hydrochlorothiazide, hydralazine, and torsemide.  All diuretics held.  4. Diabetes mellitus type II with chronic kidney disease/renal manifestations: noninsulin dependent. Home regimen includes [place]. Most recent hemoglobin A1c is 6.7 on 01/11/2022.  Well-managed at this time.    LOS: [place] 12/5/20233:48 PM

## 2022-09-03 NOTE — Progress Notes (Signed)
Nutrition Brief Note  Patient identified on the Malnutrition Screening Tool (MST) Report  Wt Readings from Last 15 Encounters:  09/01/22 91 kg  07/05/22 81.6 kg  09/28/20 106.1 kg  02/22/20 109.3 kg  06/17/18 111.1 kg  06/11/18 109.8 kg  05/13/18 109.8 kg  04/05/15 106.6 kg   Pt with medical history significant for stage IIIb chronic kidney disease, hypertension, dyslipidemia, seizure disorder, osteoarthritis, lumbar stenosis and type 2 diabetes mellitus, who presented on evening of 09/01/2022 for evaluation of worsening confusion and somnolence with hypoxemia of 88% on room air.   Pt admitted with AKI superimposed on chronic kidney disease.   12/5- s/p BSE- advanced to dysphagia 3 diet with thin liquids  Current diet order is dysphagia 3, patient is consuming approximately n/a% of meals at this time. Labs and medications reviewed.   No nutrition interventions warranted at this time. If nutrition issues arise, please consult RD.   Loistine Chance, RD, LDN, [place] Registered Dietitian II Certified Diabetes Care and Education Specialist Please refer to [facility] for RD and/or RD on-call/weekend/after hours pager

## 2022-09-03 NOTE — Progress Notes (Signed)
Speech Language Pathology Dysphagia Treatment Patient Details Name: Albert Figueroa MRN: [id number] DOB: 02/04/1956 Today's Date: 09/03/2022 Time: 0925-0939 SLP Time Calculation (min) (ACUTE ONLY): 14 min  H/P per EMR Albert Figueroa is a 66 year old male with past medical conditions including hypertension, seizure disorder, osteoarthritis, type 2 diabetes, dyslipidemia, and chronic kidney disease stage IIIb.  Patient presents to the emergency department with altered mental status and has been admitted for Acute kidney injury superimposed on chronic kidney disease  SLP History 07/06/2022 Clinical Swallow Evaluation Recommend regular consistency diet w/ well-Cut meats, moistened foods(d/t Edentulous status, choices overall); Thin liquids. Pt should help to feed self and hold Cup when drinking but needs Support during meals d/t RUE immobility currently. Recommend general aspiration and Reflux precautions  07/09/2022 Speech Language Cognitive Evaluation Pt presents with what is likely acute on chronic moderate to severe memory impairment that contirubutes to pt's multifactorial moderate cognitive impairment. Specifically, pt has deficits in recall of new information, working memory, Counselling psychologist. The resultant characteristic is confabulation of information.   Clinical Indicators: CXR: Low lung volumes with mild bibasilar atelectasis and/or infiltrate. Head CT: 1. No acute intracranial process. 2. Unchanged position of right parietal approach ventriculostomy catheter. No evidence of hydrocephalus. WBC: 3.9 Temp: 98.4   Medication Administration: Whole meds with puree (for safer swallowing while in hospital(bed))   Assessment / Plan / Recommendation Clinical Impression   Patient presents with mild oropharyngeal dysphagia characterized by prolonged oral mastication and preparation with regular solids. Suspect near baseline with edentulous status however increased latency in responses and  inconsistent patient reports indicate reduced capacity for patient to self-select soft solids and self-monitor which increases risk of aspiration with advanced regular solids at this time. No overt s/s aspiration across consistencies to warrant FEES/MBSS at this time. Recommend downgrade to soft solids (Dysphagia 3) diet and continue thin liquids. SLP to follow for dysphagia management.   Speech/language/Cognitive Screen: prolonged response time with incomplete sentences. Oriented to year and place with increased response time and minimal cues. Notable cognitive deficits at baseline per chart review and patient evaluations in prior admission. Suspect patient is below baseline per demonstration iso acute illness though given history patient likely requires assistance and supervision in home environment.     Diet Recommendation       SLP Plan Continue with current plan of care   Swallowing Goals     General Behavior/Cognition: Alert;Confused Patient Positioning: Upright in bed Oral care provided: N/A HPI: Albert Figueroa is a 66 y.o. Caucasian male with medical history significant for stage IIIb chronic kidney disease, hypertension, dyslipidemia, seizure disorder, osteoarthritis, lumbar stenosis and type 2 diabetes mellitus, who presented to the emergency room with acute onset of altered mental status with confusion and somnolence with hypoxemia of 88% on room air. Found to have acute kidney injury, acute metabolic encephalopathy, and hypotension.  Oral Cavity - Oral Hygiene BID  Dysphagia Treatment Treatment Methods: Skilled observation;Patient/caregiver education Patient observed directly with PO's: Yes Type of PO's observed: Regular;Thin liquids Feeding: Needed assist Liquids provided via: Straw Oral Phase Signs & Symptoms: Prolonged bolus formation;Prolonged mastication Pharyngeal Phase Signs & Symptoms:  (none) Type of cueing: Verbal Amount of cueing: Minimal   Ethelene Hal, M.A.  CCC-SLP   Albert Figueroa 09/03/2022, 9:45 AM

## 2022-09-03 NOTE — Progress Notes (Signed)
The writer was is not able to obtain pertinent information for patient's  admission since patient is confused.

## 2022-09-04 DIAGNOSIS — N179 Acute kidney failure, unspecified: Secondary | ICD-10-CM | POA: Diagnosis not present

## 2022-09-04 DIAGNOSIS — I9589 Other hypotension: Secondary | ICD-10-CM | POA: Diagnosis not present

## 2022-09-04 DIAGNOSIS — E663 Overweight: Secondary | ICD-10-CM | POA: Diagnosis not present

## 2022-09-04 DIAGNOSIS — G9341 Metabolic encephalopathy: Secondary | ICD-10-CM | POA: Diagnosis not present

## 2022-09-04 LAB — BASIC METABOLIC PANEL
Anion gap: 2 — ABNORMAL LOW (ref 5–15)
BUN: 43 mg/dL — ABNORMAL HIGH (ref 8–23)
CO2: 20 mmol/L — ABNORMAL LOW (ref 22–32)
Calcium: 8.7 mg/dL — ABNORMAL LOW (ref 8.9–10.3)
Chloride: 126 mmol/L — ABNORMAL HIGH (ref 98–111)
Creatinine, Ser: 2.18 mg/dL — ABNORMAL HIGH (ref 0.61–1.24)
GFR, Estimated: 33 mL/min — ABNORMAL LOW (ref >60)
Glucose, Bld: 79 mg/dL (ref 70–99)
Potassium: 4.5 mmol/L (ref 3.5–5.1)
Sodium: 148 mmol/L — ABNORMAL HIGH (ref 135–145)

## 2022-09-04 LAB — GLUCOSE, CAPILLARY
Glucose-Capillary: 61 mg/dL — ABNORMAL LOW (ref 70–99)
Glucose-Capillary: 74 mg/dL (ref 70–99)
Glucose-Capillary: 96 mg/dL (ref 70–99)
Glucose-Capillary: 171 mg/dL — ABNORMAL HIGH (ref 70–99)
Glucose-Capillary: 90 mg/dL (ref 70–99)

## 2022-09-04 LAB — CBC
HCT: 30 % — ABNORMAL LOW (ref 39.0–52.0)
Hemoglobin: 9.3 g/dL — ABNORMAL LOW (ref 13.0–17.0)
MCH: 32.2 pg (ref 26.0–34.0)
MCHC: 31 g/dL (ref 30.0–36.0)
MCV: 103.8 fL — ABNORMAL HIGH (ref 80.0–100.0)
Platelets: 89 10*3/uL — ABNORMAL LOW (ref 150–400)
RBC: 2.89 MIL/uL — ABNORMAL LOW (ref 4.22–5.81)
RDW: 13.9 % (ref 11.5–15.5)
WBC: 2.9 10*3/uL — ABNORMAL LOW (ref 4.0–10.5)
nRBC: 0 % (ref 0.0–0.2)

## 2022-09-04 MED ORDER — SODIUM CHLORIDE 0.9 % IV SOLN: INTRAVENOUS | Status: DC

## 2022-09-04 NOTE — Evaluation (Signed)
Physical Therapy Evaluation Patient Details Name: Albert Figueroa MRN: [id number] DOB: [birth year] Today's Date: 09/04/2022  History of Present Illness  66 y/o male presented to ED on 09/01/22 for AMS and hypoxia. Admitted for AKI and acute metabolic encephalopathy. PMH: CKD stage IIIb, HTN, seizure, T2DM  Clinical Impression  Patient admitted with the above. PTA, patient states he recently moved in with his sister who lives in a 2nd floor apartment with flight of stairs to enter. Patient reports not using AD for mobility but "probably should be". No family present to determine baseline cognition and provide PLOF. Patient presents with weakness, impaired balance, decreased activity tolerance, and poor safety awareness and awareness of deficits. Patient required minA for bed mobility and transfers with RW. Upon standing, patient incontinent of bowels with no awareness of urge. Able to stand during [place]. Patient will benefit from skilled PT services during acute stay to address listed deficits. Recommend SNF at this time unless family is able to provide 24/7 assistance/supervision.      Recommendations for follow up therapy are one component of a multi-disciplinary discharge planning process, led by the attending physician.  Recommendations may be updated based on patient status, additional functional criteria and insurance authorization.  Follow Up Recommendations Skilled nursing-short term rehab (<3 hours/day) (if family unable to provide 24/7 supervision) Can patient physically be transported by private vehicle: Yes    Assistance Recommended at Discharge Frequent or constant Supervision/Assistance  Patient can return home with the following  A little help with walking and/or transfers;A little help with bathing/dressing/bathroom;Assistance with cooking/housework;Direct supervision/assist for medications management;Direct supervision/assist for financial management;Assist for  transportation;Help with stairs or ramp for entrance    Equipment Recommendations Rolling Katy Brickell (2 wheels)  Recommendations for Other Services  OT consult    Functional Status Assessment Patient has had a recent decline in their functional status and demonstrates the ability to make significant improvements in function in a reasonable and predictable amount of time.     Precautions / Restrictions Precautions Precautions: Fall Restrictions Weight Bearing Restrictions: No      Mobility  Bed Mobility Overal bed mobility: Needs Assistance Bed Mobility: Supine to Sit     Supine to sit: Min assist     General bed mobility comments: assist for trunk elevation    Transfers Overall transfer level: Needs assistance Equipment used: Rolling Alysa Duca (2 wheels) Transfers: Sit to/from Stand Sit to Stand: Min assist           General transfer comment: assist to boost up into standing    Ambulation/Gait Ambulation/Gait assistance: Min assist Gait Distance (Feet): 3 Feet Assistive device: Rolling [place]field Beem (2 wheels) Gait Pattern/deviations: Decreased stride length, Step-to pattern Gait velocity: decreased     General Gait Details: steps towards recliner on R side with use of RW  Stairs            Wheelchair Mobility    Modified Rankin (Stroke Patients Only)       Balance Overall balance assessment: Needs assistance Sitting-balance support: No upper extremity supported, Feet supported Sitting balance-Leahy Scale: Good     Standing balance support: Bilateral upper extremity supported, Reliant on assistive device for balance Standing balance-Leahy Scale: Fair                               Pertinent Vitals/Pain Pain Assessment Pain Assessment: No/denies pain    Home Living Family/patient expects to be discharged to:: Private  residence Living Arrangements: Other relatives (sister) Available Help at Discharge: Family Type of Home: Apartment (2nd  floor) Home Access: Stairs to enter   Entrance Stairs-Number of Steps: flight   Home Layout: One level Home Equipment: Conservation officer, nature (2 wheels);Cane - single point      Prior Function Prior Level of Function : Independent/Modified Independent             Mobility Comments: reports using RW and cane at times but was instructed at rehab to use them       Hand Dominance        Extremity/Trunk Assessment   Upper Extremity Assessment Upper Extremity Assessment: Defer to OT evaluation    Lower Extremity Assessment Lower Extremity Assessment: Generalized weakness    Cervical / Trunk Assessment Cervical / Trunk Assessment: Kyphotic  Communication   Communication: No difficulties  Cognition Arousal/Alertness: Awake/alert Behavior During Therapy: WFL for tasks assessed/performed Overall Cognitive Status: No family/caregiver present to determine baseline cognitive functioning                                 General Comments: STM deficits noted and decreased awareness of deficits and of safety. No family present to determine baseline cognition        General Comments      Exercises     Assessment/Plan    PT Assessment Patient needs continued PT services  PT Problem List Decreased strength;Decreased activity tolerance;Decreased balance;Decreased mobility;Decreased cognition;Decreased knowledge of use of DME;Decreased safety awareness;Decreased knowledge of precautions       PT Treatment Interventions DME instruction;Gait training;Functional mobility training;Therapeutic activities;Stair training;Therapeutic exercise;Balance training;Patient/family education    PT Goals (Current goals can be found in the Care Plan section)  Acute Rehab PT Goals Patient Stated Goal: to go home PT Goal Formulation: With patient Time For Goal Achievement: 09/18/22 Potential to Achieve Goals: Good    Frequency Min 2X/week     Co-evaluation                AM-PAC PT "6 Clicks" Mobility  Outcome Measure Help needed turning from your back to your side while in a flat bed without using bedrails?: A Little Help needed moving from lying on your back to sitting on the side of a flat bed without using bedrails?: A Little Help needed moving to and from a bed to a chair (including a wheelchair)?: A Little Help needed standing up from a chair using your arms (e.g., wheelchair or bedside chair)?: A Little Help needed to walk in hospital room?: A Little Help needed climbing 3-5 steps with a railing? : A Lot 6 Click Score: 17    End of Session Equipment Utilized During Treatment: Gait belt Activity Tolerance: Patient tolerated treatment well Patient left: in chair;with call bell/phone within reach;with chair alarm set Nurse Communication: Mobility status PT Visit Diagnosis: Unsteadiness on feet (R26.81);Muscle weakness (generalized) (M62.81);Difficulty in walking, not elsewhere classified (R26.2)    Time: 9892-1194 PT Time Calculation (min) (ACUTE ONLY): 34 min   Charges:   PT Evaluation $PT Eval Moderate Complexity: 1 Mod PT Treatments $Therapeutic Activity: 8-22 mins        Gaither Biehn A. Gilford Rile PT, DPT [facility] - Acute Rehabilitation Services   Dalary Hollar A Allysson Rinehimer 09/04/2022, 4:03 PM

## 2022-09-04 NOTE — Progress Notes (Signed)
  Progress Note   Patient: Albert Figueroa WLN:[id number] DOB: Nov 18, 1955 DOA: 09/01/2022     3 DOS: the patient was seen and examined on 09/04/2022   Brief hospital course: Albert Figueroa is a 66 y.o. Caucasian male with medical history significant for stage IIIb chronic kidney disease, hypertension, dyslipidemia, seizure disorder, osteoarthritis, lumbar stenosis and type 2 diabetes mellitus, who presented on evening of 09/01/2022 for evaluation of worsening confusion and somnolence with hypoxemia of 88% on room air.  He was unable to provide history due to being lethargic.  Wife noted no urine output day of admission.    Found to have acute kidney injury with creatinine of 7.5.  Nephrology consulted and patient started on aggressive IV fluid resuscitation and over time, patient's renal function and mentation have continued to improve.    Assessment and Plan: * Acute kidney injury superimposed on chronic kidney disease ([place]) Metabolic acidosis due to renal failure.  Continues to improve with IV fluids, recheck labs in the morning.  Nephrology following.  Chronic pain syndrome Involving shoulder, back, both knees.   --Resume home oxycodone with BP hold parameter, also hold if pt lethargic  Acute metabolic encephalopathy-resolved as of 09/04/2022 Much improved, alert and oriented x 3, appropriate   Blepharitis of right lower eyelid Tobramycin ointment to lower eyelid.  Hypotension POA, likely due to hypovolemia.  Still somewhat present, so we will continue IV fluids  Dyslipidemia Continue statin and fenofibrate.  Essential hypertension Holding lisinopril HCTZ given acute kidney injury and hypotension on admission.  Type II diabetes mellitus with renal manifestations (HCC) Resume sliding scale when indicated, d/c'd due to hypoglycemia and borderline CBG's Hold Farxiga due to borderline glucose, avoid hypoglycemia.  Chronic obstructive pulmonary disease (COPD) (HCC) Stable, not  exacerbated. Continue Trelegy equivalent and Combivent.  Over weight Meets criteria with BMI greater than 25  Hypoglycemia Due to AMS/lethargy and NPO status on admission.   --Hypoglycemia protocol --[place] and sliding scale coverage on hold  Metabolic acidosis See AKI  GERD without esophagitis Continue PPI.  Depression Continue Cymbalta and Depakote ER.  Pulmonary hypertension ([place]) Continue  Daliresp.        Subjective: Patient with no complaints.  Still feels very weak.  Physical Exam: Vitals:   09/03/22 0828 09/03/22 1525 09/04/22 0005 09/04/22 1205  BP: (!) 124/93 124/61 (!) 88/51 123/73  Pulse: 88 66 (!) 57 69  Resp: 18 18 18 16   Temp: 98.4 F (36.9 C) 98.1 F (36.7 C) 97.8 F (36.6 C) 97.7 F (36.5 C)  TempSrc: Oral     SpO2: 100% 100% 100% 100%  Weight:      Height:       General exam: Oriented x 3, no acute distress HEENT: Mucous membranes slightly dry Respiratory system: Clear to auscultation bilaterally Cardiovascular system: Regular rate and rhythm, S1-S2 Gastrointestinal system: Soft, nontender, nondistended with positive bowel sounds Central nervous system: No focal deficits Extremities: No clubbing or cyanosis or edema Skin: No skin breaks, tears or lesions Psychiatry: Appropriate, no evidence of psychoses   Data Reviewed:  Creatinine at 2.18 with GFR of 33.  Hemoglobin of 9.3 with MCV of 104  Family Communication: Will call sister  Disposition: Status is: Inpatient Remains inpatient appropriate because:  -Improvement in renal function -Needs skilled nursing   Planned Discharge Destination: Skilled nursing facility     Author: Annita Brod, MD 09/04/2022 4:26 PM  For on call review [email or web address].

## 2022-09-04 NOTE — Progress Notes (Signed)
[facility]  ROUNDING NOTE   Subjective:   LENNOX DOLBERRY is a 66 year old male with past medical conditions including hypertension, seizure disorder, osteoarthritis, type 2 diabetes, dyslipidemia, and chronic kidney disease stage IIIb.  Patient presents to the emergency department with altered mental status and has been admitted for AKI (acute kidney injury) ([place]) [N17.9] Altered mental status, unspecified altered mental status type [R41.82] Acute kidney injury superimposed on chronic kidney disease ([place]) [N17.9, N18.9]  Patient is known to our practice and is followed outpatient by Dr. Holley Raring.  Patient was last seen in office in June of this year.   Patient seen sitting up in bed, alert and oriented today Patient has no recollection of hospital events or arrival prior to today Patient denies any recent illness prior to admission Patient is eating breakfast tray, 75% Room air No lower extremity edema  Urine output 1.75 L in preceding 24 hours  Objective:  Vital signs in last 24 hours:  Temp:  [97.7 F (36.5 C)-97.8 F (36.6 C)] 97.7 F (36.5 C) (12/06 1205) Pulse Rate:  [57-69] 69 (12/06 1205) Resp:  [16-18] 16 (12/06 1205) BP: (88-123)/(51-73) 123/73 (12/06 1205) SpO2:  [100 %] 100 % (12/06 1205)  Weight change:  Filed Weights   09/01/22 1841 09/01/22 2340  Weight: 64.3 kg 91 kg    Intake/Output: I/O last 3 completed shifts: In: 2480.1 [I.V.:2480.1] Out: 2450 [Urine:2450]   Intake/Output this shift:  Total I/O In: 240 [P.O.:240] Out: 400 [Urine:400]  Physical Exam: General: NAD  Head: Normocephalic, atraumatic.  Moist oral mucosal membranes  Eyes: Anicteric  Lungs:  Clear to auscultation, normal effort  Heart: Regular rate and rhythm  Abdomen:  Soft, nontender, nondistended  Extremities:  No peripheral edema.  Neurologic: Alert, oriented to self and place, moving all four extremities  Skin: No lesions  Access: None    Basic Metabolic  Panel: Recent Labs  Lab 09/01/22 1841 09/02/22 0506 09/03/22 0220 09/04/22 0619  NA 143 143 145 148*  K 4.4 4.1 4.3 4.5  CL 116* 119* 120* 126*  CO2 13* 17* 19* 20*  GLUCOSE 83 70 88 79  BUN 91* 77* 56* 43*  CREATININE 7.50* 5.53* 3.52* 2.18*  CALCIUM 7.9* 8.1* 8.7* 8.7*  MG  --   --  2.5*  --      Liver Function Tests: Recent Labs  Lab 09/01/22 1841  AST 65*  ALT 23  ALKPHOS 61  BILITOT 1.1  PROT 6.6  ALBUMIN 2.7*    No results for input(s): "LIPASE", "AMYLASE" in the last 168 hours. Recent Labs  Lab 09/01/22 1843  AMMONIA 27     CBC: Recent Labs  Lab 09/01/22 1841 09/02/22 0506 09/03/22 0220 09/04/22 0619  WBC 5.2 4.1 3.9* 2.9*  NEUTROABS 3.0  --   --   --   HGB 8.1* 8.8* 9.3* 9.3*  HCT 27.1* 29.1* 30.6* 30.0*  MCV 107.5* 106.2* 104.1* 103.8*  PLT 115* 88* 103* 89*     Cardiac Enzymes: No results for input(s): "CKTOTAL", "CKMB", "CKMBINDEX", "TROPONINI" in the last 168 hours.  BNP: Invalid input(s): "POCBNP"  CBG: Recent Labs  Lab 09/03/22 1725 09/03/22 2059 09/04/22 0744 09/04/22 0828 09/04/22 1126  GLUCAP 91 81 61* 74 96     Microbiology: Results for orders placed or performed during the hospital encounter of 09/01/22  Blood Culture (routine x 2)     Status: None (Preliminary result)   Collection Time: 09/01/22  6:41 PM   Specimen: BLOOD  Result Value Ref Range Status   Specimen Description BLOOD BLOOD LEFT ARM  Final   Special Requests   Final    BOTTLES DRAWN AEROBIC AND ANAEROBIC Blood Culture adequate volume   Culture   Final    NO GROWTH 3 DAYS Performed at [facility], [address], [place], [place] [address]    Report Status PENDING  Incomplete  SARS Coronavirus 2 by RT PCR (hospital order, performed in [facility] hospital lab) *cepheid single result test* Anterior Nasal Swab     Status: None   Collection Time: 09/01/22  6:42 PM   Specimen: Anterior Nasal Swab  Result Value Ref Range Status   SARS  Coronavirus 2 by RT PCR NEGATIVE NEGATIVE Final    Comment: (NOTE) SARS-CoV-2 target nucleic acids are NOT DETECTED.  The SARS-CoV-2 RNA is generally detectable in upper and lower respiratory specimens during the acute phase of infection. The lowest concentration of SARS-CoV-2 viral copies this assay can detect is 250 copies / mL. A negative result does not preclude SARS-CoV-2 infection and should not be used as the sole basis for treatment or other patient management decisions.  A negative result may occur with improper specimen collection / handling, submission of specimen other than nasopharyngeal swab, presence of viral mutation(s) within the areas targeted by this assay, and inadequate number of viral copies (<250 copies / mL). A negative result must be combined with clinical observations, patient history, and epidemiological information.  Fact Sheet for Patients:   https://www.patel.info/  Fact Sheet for Healthcare Providers: https://hall.com/  This test is not yet approved or  cleared by the [place] FDA and has been authorized for detection and/or diagnosis of SARS-CoV-2 by FDA under an Emergency Use Authorization (EUA).  This EUA will remain in effect (meaning this test can be used) for the duration of the COVID-19 declaration under Section 564(b)(1) of the Act, 21 U.S.C. section 360bbb-3(b)(1), unless the authorization is terminated or revoked sooner.  Performed at [facility], [address] [place], [place] [address]   Culture, blood (Routine X 2) w Reflex to ID Panel     Status: None (Preliminary result)   Collection Time: 09/03/22  2:20 AM   Specimen: BLOOD  Result Value Ref Range Status   Specimen Description BLOOD BLOOD RIGHT ARM  Final   Special Requests   Final    IN BOTH AEROBIC AND ANAEROBIC BOTTLES Blood Culture adequate volume   Culture   Final    NO GROWTH 1 DAY Performed at [facility], [address], [place], [place] [address]    Report Status PENDING  Incomplete    Coagulation Studies: No results for input(s): "LABPROT", "INR" in the last 72 hours.  Urinalysis: Recent Labs    09/01/22 1920  COLORURINE AMBER*  LABSPEC 1.021  PHURINE 5.0  GLUCOSEU NEGATIVE  HGBUR SMALL*  BILIRUBINUR NEGATIVE  KETONESUR NEGATIVE  PROTEINUR 30*  NITRITE NEGATIVE  LEUKOCYTESUR NEGATIVE       Imaging: No results found.   Medications:    sodium chloride      divalproex  1,000 mg Oral BID   DULoxetine  30 mg Oral Daily   fenofibrate  160 mg Oral Daily   ferrous sulfate  325 mg Oral Q breakfast   heparin injection (subcutaneous)  5,000 Units Subcutaneous Q8H   lisinopril  20 mg Oral Daily   pantoprazole  40 mg Oral Daily   roflumilast  250 mcg Oral QHS   rosuvastatin  20 mg Oral QHS   tiZANidine  4 mg Oral QHS   acetaminophen **OR** acetaminophen, cyclobenzaprine, hydrOXYzine, ipratropium-albuterol, magnesium hydroxide, ondansetron **OR** ondansetron (ZOFRAN) IV, oxyCODONE, traZODone  Assessment/ Plan:  Mr. Albert Figueroa is a 66 y.o.  male with past medical conditions including hypertension, seizure disorder, osteoarthritis, type 2 diabetes, dyslipidemia, and chronic kidney disease stage IIIb.  Patient presents to the emergency department with altered mental status and has been admitted for AKI (acute kidney injury) ([place]) [N17.9] Altered mental status, unspecified altered mental status type [R41.82] Acute kidney injury superimposed on chronic kidney disease ([place]) [N17.9, N18.9]    Acute Kidney Injury on chronic kidney disease stage 3B with baseline creatinine 2.15 and GFR of 33 on 07/10/22.  Acute kidney injury appears to be multifactorial from diuresis, hypotension, and hypovolemia Chronic kidney disease is secondary to diabetes and hypertension.  Renal ultrasound negative for obstruction.  No IV contrast exposure.    Creatinine has returned to baseline.   Will stop IV fluids and patient encouraged to maintain oral intake.  Adequate urine output recorded.  Patient will need follow-up in our office at discharge.  Lab Results  Component Value Date   CREATININE 2.18 (H) 09/04/2022   CREATININE 3.52 (H) 09/03/2022   CREATININE 5.53 (H) 09/02/2022    Intake/Output Summary (Last 24 hours) at 09/04/2022 1541 Last data filed at 09/04/2022 1010 Gross per 24 hour  Intake 240 ml  Output 1250 ml  Net -1010 ml    2. Anemia of chronic kidney disease Lab Results  Component Value Date   HGB 9.3 (L) 09/04/2022    Hemoglobin within acceptable range.  3.  Hypertension with chronic kidney disease.  Home regimen includes lisinopril-hydrochlorothiazide, hydralazine, and torsemide.  All diuretics held.  Recommend holding torsemide and hydrochlorothiazide at discharge.  4. Diabetes mellitus type II with chronic kidney disease/renal manifestations: noninsulin dependent. Home regimen includes [place]. Most recent hemoglobin A1c is 6.7 on 01/11/2022.      LOS: 3   12/6/20233:41 PM

## 2022-09-04 NOTE — Care Management Important Message (Signed)
Important Message  Patient Details  Name: TREVAN MESSMAN MRN: [id number] Date of Birth: [birth year]   Medicare Important Message Given:  Yes     Dannette Barbara 09/04/2022, 12:29 PM

## 2022-09-04 NOTE — Assessment & Plan Note (Signed)
Meets criteria with BMI greater than 25 

## 2022-09-04 NOTE — Progress Notes (Signed)
Hypoglycemic Event  CBG: 61   Treatment: 4 oz juice/soda  Symptoms: None  Follow-up CBG: Time:0810 CBG Result:74   Possible Reasons for Event: Inadequate meal intake  Comments/MD notified: Dr Maryland Pink notified,no new orders at this time    Chrisotpher Rivero J Braylynn Ghan

## 2022-09-04 NOTE — TOC Progression Note (Signed)
Transition of Care [facility]) - Progression Note    Patient Details  Name: Albert Figueroa MRN: [id number] Date of Birth: Aug 20, 1956  Transition of Care [facility]) CM/SW [place], RN Phone Number: 09/04/2022, 4:09 PM  Clinical Narrative:     Patient is open with [place] for [facility] services Nursing per Velna Hatchet at [facility]  Expected Discharge Plan: Home/Self Care Barriers to Discharge: Barriers Resolved  Expected Discharge Plan and Services Expected Discharge Plan: Home/Self Care   Discharge Planning Services: CM Consult, NA   Living arrangements for the past 2 months: Single Family Home                   DME Agency: NA                   Social Determinants of Health (SDOH) Interventions    Readmission Risk Interventions     No data to display

## 2022-09-04 NOTE — Progress Notes (Signed)
Speech Language Pathology Treatment: Dysphagia  Patient Details Name: Albert Figueroa MRN: [id number] DOB: 11-12-1955 Today's Date: 09/04/2022 Time: 1431-1510 SLP Time Calculation (min) (ACUTE ONLY): 39 min  Assessment / Plan / Recommendation Clinical Impression  Pt seen for ongoing assessment of swallowing and toleration of diet. Pt awake, sitting in chair post PT session. Pt verbal and able to follow basic commands w/ cues. Pt has a Baseline of Cognitive-communication decline per previous assessment 06/2022(see note indicating his followed by Neurology d/t Cognitive decline at Baseline).  Pt requesting something to drink.  On RA, afebrile. WBC WNL. Afebrile.   Pt appears to present w/ adequate oropharyngeal phase swallow function in setting of Edentulous status w/ No oropharyngeal neuromuscular deficits noted. Pt consumed po trials w/ No overt, clinical s/s of aspiration during po trials. Pt appears at reduced risk for aspiration following general aspiration precautions and when using a slightly modified diet consistency of Mech Soft in setting of Edentulous status and decreased awareness of self/deficits/choosing easy to eat foods.  Pt does have challenging factors that could impact oropharyngeal phase swallowing as well as overall oral intake including deconditioned/weakness, cognitive-communication decline, and Edentulous status.    During po trials, pt consumed all consistencies w/ No overt coughing, decline in vocal quality, or change in respiratory presentation during/post trials. O2 sats 97%. Oral phase appeared grossly [facility] w/ timely bolus management, mashing/gumming, and control of bolus propulsion for A-P transfer for swallowing. Oral clearing achieved w/ all trial consistencies -- moistened, soft foods given for ease of oral phase d/t Edentulous status. Pt does require TIME for bolus management d/t Edentulous status but the Time for mashing/gumming and orally clearing mouth was not overly  lengthy. Pt stated that Moistening foods helped.    Recommend a more Mech Soft consistency diet w/ well-Cut meats, moistened foods(d/t Edentulous status; Thin liquids. Pt should help to feed self and hold Cup when drinking but could need Support during meals w/ feeding d/t stiff fingers/hands. Recommend general aspiration and Reflux precautions. Pills WHOLE in Puree for safer, easier swallowing.    Education given on Pills in Puree; food consistencies and easy to eat options; general aspiration precautions to pt and NSG. Pt appears at/close to his Baseline re: oropharyngeal swallow function.  NSG to reconsult if any new needs arise. NSG updated, agreed. MD updated. Recommend Dietician f/u.      HPI HPI: Albert Figueroa is a 66 y.o. Caucasian male with medical history significant for stage IIIb chronic kidney disease, hypertension, dyslipidemia, seizure disorder, osteoarthritis, lumbar stenosis and type 2 diabetes mellitus, who presented to the emergency room with acute onset of altered mental status with confusion and somnolence with hypoxemia of 88% on room air. Found to have acute kidney injury, acute metabolic encephalopathy, and hypotension.  OF NOTE: pt has been assessed for cognitive-communication deficits and per Family member has been seen/followed by Neurology d/t Cognitive decline at baseline.      SLP Plan  All goals met      Recommendations for follow up therapy are one component of a multi-disciplinary discharge planning process, led by the attending physician.  Recommendations may be updated based on patient status, additional functional criteria and insurance authorization.    Recommendations  Diet recommendations: Dysphagia 3 (mechanical soft);Thin liquid (ease of cut foods d/t hands/Edentulous status) Liquids provided via: Cup;Straw Medication Administration: Whole meds with puree Supervision: Patient able to self feed;Staff to assist with self feeding;Intermittent supervision  to cue for compensatory strategies Compensations: Minimize environmental  distractions;Slow rate;Small sips/bites;Lingual sweep for clearance of pocketing;Follow solids with liquid Postural Changes and/or Swallow Maneuvers: Out of bed for meals;Seated upright 90 degrees;Upright 30-60 min after meal                General recommendations:  (Dietician f/u as needed) Oral Care Recommendations: Oral care BID;Oral care before and after PO;Staff/trained caregiver to provide oral care (setup support) Follow Up Recommendations: No SLP follow up Assistance recommended at discharge: Intermittent Supervision/Assistance (in setting of Baseline cognitive-linguistic decline) SLP Visit Diagnosis: Dysphagia, unspecified (R13.10) (Edentulous at baseline; Cognitive-communication decline at baseline) Plan: All goals met             [name], MS, CCC-SLP Speech Language Pathologist Rehab Services; [place] [phone] (ascom) [place]  09/04/2022, 4:19 PM

## 2022-09-04 NOTE — TOC Progression Note (Addendum)
Transition of Care [facility]) - Progression Note    Patient Details  Name: Albert Figueroa MRN: [id number] Date of Birth: 01-Jun-1956  Transition [facility]) CM/SW [place], RN Phone Number: 09/04/2022, 8:08 AM  Clinical Narrative:    Patient come from home with his wife. He is alert and oriented to person and place Nephrology consulted, at this time no plan for acute dialysis,  Patient went to [facility] for STR in Oct TOC to continue to monitor for needs and assist with DC planning as needed        Expected Discharge Plan and Services                                                 Social Determinants of Health (SDOH) Interventions    Readmission Risk Interventions     No data to display

## 2022-09-05 DIAGNOSIS — E663 Overweight: Secondary | ICD-10-CM | POA: Diagnosis not present

## 2022-09-05 DIAGNOSIS — G9341 Metabolic encephalopathy: Secondary | ICD-10-CM | POA: Diagnosis not present

## 2022-09-05 DIAGNOSIS — N179 Acute kidney failure, unspecified: Secondary | ICD-10-CM | POA: Diagnosis not present

## 2022-09-05 DIAGNOSIS — I9589 Other hypotension: Secondary | ICD-10-CM | POA: Diagnosis not present

## 2022-09-05 LAB — CBC
HCT: 27.1 % — ABNORMAL LOW (ref 39.0–52.0)
Hemoglobin: 8.3 g/dL — ABNORMAL LOW (ref 13.0–17.0)
MCH: 31.7 pg (ref 26.0–34.0)
MCHC: 30.6 g/dL (ref 30.0–36.0)
MCV: 103.4 fL — ABNORMAL HIGH (ref 80.0–100.0)
Platelets: 86 10*3/uL — ABNORMAL LOW (ref 150–400)
RBC: 2.62 MIL/uL — ABNORMAL LOW (ref 4.22–5.81)
RDW: 14.2 % (ref 11.5–15.5)
WBC: 2.7 10*3/uL — ABNORMAL LOW (ref 4.0–10.5)
nRBC: 0 % (ref 0.0–0.2)

## 2022-09-05 LAB — CBC WITH DIFFERENTIAL/PLATELET
Abs Immature Granulocytes: 0.02 10*3/uL (ref 0.00–0.07)
Basophils Absolute: 0 10*3/uL (ref 0.0–0.1)
Basophils Relative: 1 %
Eosinophils Absolute: 0.1 10*3/uL (ref 0.0–0.5)
Eosinophils Relative: 3 %
HCT: 27.4 % — ABNORMAL LOW (ref 39.0–52.0)
Hemoglobin: 8.4 g/dL — ABNORMAL LOW (ref 13.0–17.0)
Immature Granulocytes: 1 %
Lymphocytes Relative: 42 %
Lymphs Abs: 1.2 10*3/uL (ref 0.7–4.0)
MCH: 31.9 pg (ref 26.0–34.0)
MCHC: 30.7 g/dL (ref 30.0–36.0)
MCV: 104.2 fL — ABNORMAL HIGH (ref 80.0–100.0)
Monocytes Absolute: 0.3 10*3/uL (ref 0.1–1.0)
Monocytes Relative: 10 %
Neutro Abs: 1.2 10*3/uL — ABNORMAL LOW (ref 1.7–7.7)
Neutrophils Relative %: 43 %
Platelets: 84 10*3/uL — ABNORMAL LOW (ref 150–400)
RBC: 2.63 MIL/uL — ABNORMAL LOW (ref 4.22–5.81)
RDW: 14.1 % (ref 11.5–15.5)
WBC: 2.7 10*3/uL — ABNORMAL LOW (ref 4.0–10.5)
nRBC: 0 % (ref 0.0–0.2)

## 2022-09-05 LAB — BASIC METABOLIC PANEL
Anion gap: 2 — ABNORMAL LOW (ref 5–15)
BUN: 38 mg/dL — ABNORMAL HIGH (ref 8–23)
CO2: 21 mmol/L — ABNORMAL LOW (ref 22–32)
Calcium: 8.6 mg/dL — ABNORMAL LOW (ref 8.9–10.3)
Chloride: 124 mmol/L — ABNORMAL HIGH (ref 98–111)
Creatinine, Ser: 1.76 mg/dL — ABNORMAL HIGH (ref 0.61–1.24)
GFR, Estimated: 42 mL/min — ABNORMAL LOW (ref >60)
Glucose, Bld: 84 mg/dL (ref 70–99)
Potassium: 4.3 mmol/L (ref 3.5–5.1)
Sodium: 147 mmol/L — ABNORMAL HIGH (ref 135–145)

## 2022-09-05 LAB — GLUCOSE, CAPILLARY
Glucose-Capillary: 83 mg/dL (ref 70–99)
Glucose-Capillary: 90 mg/dL (ref 70–99)
Glucose-Capillary: 81 mg/dL (ref 70–99)
Glucose-Capillary: 94 mg/dL (ref 70–99)

## 2022-09-05 MED ORDER — TRAMADOL HCL 50 MG PO TABS
50.0000 mg | ORAL_TABLET | Freq: Four times a day (QID) | ORAL | Status: DC | PRN
  Administered 2022-09-06 (×2): 50 mg via ORAL
  Filled 2022-09-05 (×2): qty 1

## 2022-09-05 NOTE — Progress Notes (Signed)
Physical Therapy Treatment Patient Details Name: Albert Figueroa MRN: [id number] DOB: [birth year] Today's Date: 09/05/2022   History of Present Illness Pt is a 66 year old male Admitted with AKI after presenting to ED with worsening confusion and somnolence with hypoxemia of 88% on room air; PMH significant for stage IIIb chronic kidney disease, hypertension, dyslipidemia, seizure disorder, osteoarthritis, lumbar stenosis and type 2 diabetes mellitus    PT Comments    Patient progressing towards physical therapy goals. Eager to mobilize this session. Ambulated 50' in hallway with min guard and use of RW. Once returned to sitting EOB, patient requesting to ambulate into bathroom. Continues to demonstrate poor safety awareness with patient leaving RW aside when close to surface he plans to sit on. Required cues for RW proximity and safety. Notified NT about encouraging patient to ambulate to/from bathroom with assistance. Continue to recommend SNF for ongoing Physical Therapy.       Recommendations for follow up therapy are one component of a multi-disciplinary discharge planning process, led by the attending physician.  Recommendations may be updated based on patient status, additional functional criteria and insurance authorization.  Follow Up Recommendations  Skilled nursing-short term rehab (<3 hours/day) Can patient physically be transported by private vehicle: Yes   Assistance Recommended at Discharge Frequent or constant Supervision/Assistance  Patient can return home with the following A little help with walking and/or transfers;A little help with bathing/dressing/bathroom;Assistance with cooking/housework;Direct supervision/assist for medications management;Direct supervision/assist for financial management;Assist for transportation;Help with stairs or ramp for entrance   Equipment Recommendations  Rolling Daylyn Christine (2 wheels)    Recommendations for Other Services       Precautions /  Restrictions Precautions Precautions: Fall Restrictions Weight Bearing Restrictions: No     Mobility  Bed Mobility Overal bed mobility: Needs Assistance Bed Mobility: Supine to Sit, Sit to Supine     Supine to sit: Supervision, HOB elevated Sit to supine: Supervision   General bed mobility comments: increased time    Transfers Overall transfer level: Needs assistance Equipment used: Rolling Jhayden Demuro (2 wheels) Transfers: Sit to/from Stand Sit to Stand: Min assist, Supervision           General transfer comment: assist for boost into standing from low bed surface. Cues for hand placement. Supervision from slightly elevated surface    Ambulation/Gait Ambulation/Gait assistance: Min guard Gait Distance (Feet): 50 Feet (+10) Assistive device: Rolling Mazella Deen (2 wheels) Gait Pattern/deviations: Step-through pattern, Decreased stride length, Trunk flexed Gait velocity: decreased     General Gait Details: min guard for safety. Ambulated in hallway and to bathroom in room. Cues to maintain close proximity to RW. Poor safety awareness with patient pushing RW aside when close to surface he plans to sit on   Stairs             Wheelchair Mobility    Modified Rankin (Stroke Patients Only)       Balance Overall balance assessment: Needs assistance Sitting-balance support: No upper extremity supported, Feet supported Sitting balance-Leahy Scale: Good     Standing balance support: Bilateral upper extremity supported, Reliant on assistive device for balance Standing balance-Leahy Scale: Fair                              Cognition Arousal/Alertness: Awake/alert Behavior During Therapy: WFL for tasks assessed/performed Overall Cognitive Status: No family/caregiver present to determine baseline cognitive functioning Area of Impairment: Orientation, Attention, Memory, Following commands, Safety/judgement, Awareness,  Problem solving                  Orientation Level: Disoriented to, Time Current Attention Level: Sustained Memory: Decreased short-term memory Following Commands: Follows one step commands with increased time Safety/Judgement: Decreased awareness of safety, Decreased awareness of deficits Awareness: Emergent Problem Solving: Slow processing, Requires verbal cues, Requires tactile cues General Comments: able to recall this therapist from previous session. Continues to have poor safety awareness        Exercises      General Comments        Pertinent Vitals/Pain Pain Assessment Pain Assessment: No/denies pain    Home Living                          Prior Function            PT Goals (current goals can now be found in the care plan section) Acute Rehab PT Goals Patient Stated Goal: to go home PT Goal Formulation: With patient Time For Goal Achievement: 09/18/22 Potential to Achieve Goals: Good Progress towards PT goals: Progressing toward goals    Frequency    Min 2X/week      PT Plan Current plan remains appropriate    Co-evaluation              AM-PAC PT "6 Clicks" Mobility   Outcome Measure  Help needed turning from your back to your side while in a flat bed without using bedrails?: A Little Help needed moving from lying on your back to sitting on the side of a flat bed without using bedrails?: A Little Help needed moving to and from a bed to a chair (including a wheelchair)?: A Little Help needed standing up from a chair using your arms (e.g., wheelchair or bedside chair)?: A Little Help needed to walk in hospital room?: A Little Help needed climbing 3-5 steps with a railing? : A Lot 6 Click Score: 17    End of Session Equipment Utilized During Treatment: Gait belt Activity Tolerance: Patient tolerated treatment well Patient left: in bed;with bed alarm set;with call bell/phone within reach Nurse Communication: Mobility status PT Visit Diagnosis: Unsteadiness on  feet (R26.81);Muscle weakness (generalized) (M62.81);Difficulty in walking, not elsewhere classified (R26.2)     Time: 1610-9604 PT Time Calculation (min) (ACUTE ONLY): 33 min  Charges:  $Gait Training: 8-22 mins $Therapeutic Activity: 8-22 mins                     Timmya Blazier A. Gilford Rile PT, DPT [facility] - Acute Rehabilitation Services    Alecxis Baltzell A Sintia Mckissic 09/05/2022, 3:24 PM

## 2022-09-05 NOTE — Progress Notes (Signed)
   09/05/22 1241  TOC Assessment  Patient language and need for interpreter reviewed: Yes  Criminal Activity/Legal Involvement Pertinent to Current Situation/Hospitalization No - Comment as needed  Need for Family Participation in Patient Care Y  Care giver support system in place? N  Appearance: Appears stated age  Attitude/Demeanor/Rapport Engaged  Affect (typically observed) Pleasant  Orientation:  Oriented to Self;Oriented to Place;Oriented to Situation;Oriented to  Time

## 2022-09-05 NOTE — TOC Progression Note (Addendum)
Transition of Care [facility]) - Progression Note    Patient Details  Name: Albert Figueroa MRN: [id number] Date of Birth: [birth year]  Transition of Care [facility]) CM/SW Decker, RN Phone Number: 09/05/2022, 10:15 AM  Clinical Narrative:    Attempted to reach sister Olegario Shearer at [phone], recording stated it is not a working number He reports that vicy's boyfriend Elberta Fortis helps him all the time and is always there.  Attempted to confirm He is open with [facility] services Was able to reach the sister Casilda Carls at [phone] her work number She reports her phone is broken She stated that the patient lives with her and Elberta Fortis was helping the patient during the day but now has a job and will not be available until evening The patient does have DME at home including RW, 3 in 1, shower seat and cane, He will need to go to [facility] SNF The patient is agreeable to go to [facility] only if he can go to [facility], he was there before,  Reached out to [name] at [facility] to see if they can offer a bed, awaiting response   Expected Discharge Plan: Home/Self Care Barriers to Discharge: Barriers Resolved  Expected Discharge Plan and Services Expected Discharge Plan: Home/Self Care   Discharge Planning Services: CM Consult, NA   Living arrangements for the past 2 months: Single Family Home                   DME Agency: NA                   Social Determinants of Health (SDOH) Interventions    Readmission Risk Interventions     No data to display

## 2022-09-05 NOTE — Progress Notes (Signed)
Progress Note   Patient: Albert Figueroa RKY:[id number] DOB: 1955-12-17 DOA: 09/01/2022     4 DOS: the patient was seen and examined on 09/05/2022   Brief hospital course: Albert Figueroa is a 66 y.o. Caucasian male with medical history significant for stage IIIb chronic kidney disease, hypertension, dyslipidemia, seizure disorder, osteoarthritis, lumbar stenosis and type 2 diabetes mellitus, who presented on evening of 09/01/2022 for evaluation of worsening confusion and somnolence with hypoxemia of 88% on room air.  He was unable to provide history due to being lethargic.  Wife noted no urine output day of admission.    Found to have acute kidney injury with creatinine of 7.5.  Nephrology consulted and patient started on aggressive IV fluid resuscitation and over time, patient's renal function and mentation have continued to improve.  By 12/7, renal function back to normal.  Seen by physical therapy who are recommending skilled nursing.  Assessment and Plan: * Acute kidney injury superimposed on stage IIIb chronic kidney disease (HCC)-resolved as of 31/01/1760 Metabolic acidosis due to renal failure.  Nephrology following.  Renal function has since normalized.  Chronic pain syndrome Involving shoulder, back, both knees.   --Resume home oxycodone with BP hold parameter, also hold if pt lethargic  Acute metabolic encephalopathy-resolved as of 09/04/2022 Much improved, alert and oriented x 3, appropriate   Blepharitis of right lower eyelid Tobramycin ointment to lower eyelid.  Hypotension POA, likely due to hypovolemia.  Still somewhat present, so we will continue IV fluids  Dyslipidemia Continue statin and fenofibrate.  Essential hypertension Holding lisinopril HCTZ given acute kidney injury and hypotension on admission.  Type II diabetes mellitus with renal manifestations (HCC) Resume sliding scale when indicated, d/c'd due to hypoglycemia and borderline CBG's Hold Farxiga due to  borderline glucose, avoid hypoglycemia.  Chronic obstructive pulmonary disease (COPD) (HCC) Stable, not exacerbated. Continue Trelegy equivalent and Combivent.  Over weight Meets criteria with BMI greater than 25  Hypoglycemia Due to AMS/lethargy and NPO status on admission.   --Hypoglycemia protocol --Wilder Glade and sliding scale coverage on hold  Metabolic acidosis See AKI  GERD without esophagitis Continue PPI.  Depression Continue Cymbalta and Depakote ER.  Pulmonary hypertension ([place]) Continue  Daliresp.        Subjective: No complaints.  Physical Exam: Vitals:   09/04/22 1205 09/04/22 1726 09/04/22 2306 09/05/22 0921  BP: 123/73 113/66 109/61 131/77  Pulse: 69 63 63 67  Resp: 16 16 16 16   Temp: 97.7 F (36.5 C) (!) 97.5 F (36.4 C) 97.9 F (36.6 C) 97.8 F (36.6 C)  TempSrc:      SpO2: 100% 100% 98% 100%  Weight:      Height:       General exam: Oriented x 3, no acute distress HEENT: Mucous membranes slightly dry Respiratory system: Clear to auscultation bilaterally Cardiovascular system: Regular rate and rhythm, S1-S2 Gastrointestinal system: Soft, nontender, nondistended with positive bowel sounds Central nervous system: No focal deficits Extremities: No clubbing or cyanosis or edema Skin: No skin breaks, tears or lesions Psychiatry: Appropriate, no evidence of psychoses   Data Reviewed:  Sodium of 147, creatinine 1.76.  Noted pancytopenia with white blood cell count of 2.7, hemoglobin of 8.4 with an MCV of 104 and platelet count of 84.  Family Communication: Will call sister  Disposition: Status is: Inpatient Remains inpatient appropriate because:  -Needs skilled nursing   Planned Discharge Destination: Skilled nursing facility     Author: Annita Brod, MD 09/05/2022 3:28 PM  For on call review [email or web address].

## 2022-09-05 NOTE — Evaluation (Signed)
Occupational Therapy Evaluation Patient Details Name: Albert Figueroa MRN: [id number] DOB: Feb 27, 1956 Today's Date: 09/05/2022   History of Present Illness Pt is a 66 year old male Admitted with AKI after presenting to ED with worsening confusion and somnolence with hypoxemia of 88% on room air; PMH significant for stage IIIb chronic kidney disease, hypertension, dyslipidemia, seizure disorder, osteoarthritis, lumbar stenosis and type 2 diabetes mellitus   Clinical Impression   Chart reviewed, RN cleared pt for participation in OT evaluation. Pt is alert and oriented x4, fair safety awareness throughout with increased time/multi modal cueing required for processing. Pt reports he has recently moved in with his sister after rehab stay, she lives in a second floor apartment with a flight of stairs with B rails to access. Pt reports sister's boyfriend assists with steps when he goes in/out of the house. Pt reports he performs ADLs with MOD I, assist for IADLs. Pt presents with deficits in strength, endurence, activity tolerance, balance, cognation affecting safe and optimal ADL completion. Pt is eager to return home, discussed with pt need to manage steps as a potential barrier to discharge back to sisters apartment-pt endorses that this may be difficult for him right now. If pt has assist for ADL/IADL, frequent/constant supervision, safe plan to access living environment, [place] may be indicated. TOC/team notified of findings. OT will continue to follow acutely.    Recommendations for follow up therapy are one component of a multi-disciplinary discharge planning process, led by the attending physician.  Recommendations may be updated based on patient status, additional functional criteria and insurance authorization.   Follow Up Recommendations  Skilled nursing-short term rehab (<3 hours/day)     Assistance Recommended at Discharge Frequent or constant Supervision/Assistance  Patient can return home with  the following Direct supervision/assist for medications management;Assistance with cooking/housework;A little help with bathing/dressing/bathroom;A lot of help with walking and/or transfers    Functional Status Assessment  Patient has had a recent decline in their functional status and demonstrates the ability to make significant improvements in function in a reasonable and predictable amount of time.  Equipment Recommendations  None recommended by OT;Other (comment) (pt has recommended equipment)    Recommendations for Other Services       Precautions / Restrictions Precautions Precautions: Fall Restrictions Weight Bearing Restrictions: No      Mobility Bed Mobility Overal bed mobility: Needs Assistance Bed Mobility: Supine to Sit     Supine to sit: Supervision, HOB elevated     General bed mobility comments: increased time    Transfers Overall transfer level: Needs assistance Equipment used: Rolling walker (2 wheels) Transfers: Sit to/from Stand Sit to Stand: Min guard           General transfer comment: regular bed height      Balance Overall balance assessment: Needs assistance Sitting-balance support: No upper extremity supported, Feet supported Sitting balance-Leahy Scale: Good     Standing balance support: Bilateral upper extremity supported, Reliant on assistive device for balance Standing balance-Leahy Scale: Fair                             ADL either performed or assessed with clinical judgement   ADL Overall ADL's : Needs assistance/impaired Eating/Feeding: Set up;Sitting   Grooming: Wash/dry hands;Wash/dry face;Sitting;Set up               Lower Body Dressing: Minimal assistance   Toilet Transfer: Min guard Toilet Transfer Details (indicate  cue type and reason): short amb transfer to bedside chair with RW; intermittent vcs for technique/safety Toileting- Clothing Manipulation and Hygiene: Maximal assistance;Sit to/from  stand       Functional mobility during ADLs: Supervision/safety;Min guard;Rolling walker (2 wheels)       Vision Patient Visual Report: No change from baseline       Perception     Praxis      Pertinent Vitals/Pain Pain Assessment Pain Assessment: 0-10 Pain Score: 0-No pain     Hand Dominance     Extremity/Trunk Assessment Upper Extremity Assessment Upper Extremity Assessment: Overall WFL for tasks assessed   Lower Extremity Assessment Lower Extremity Assessment: Generalized weakness       Communication Communication Communication: No difficulties   Cognition Arousal/Alertness: Awake/alert Behavior During Therapy: WFL for tasks assessed/performed Overall Cognitive Status: No family/caregiver present to determine baseline cognitive functioning Area of Impairment: Orientation, Attention, Memory, Following commands, Safety/judgement, Awareness, Problem solving                 Orientation Level: Disoriented to, Time Current Attention Level: Sustained Memory: Decreased short-term memory Following Commands: Follows one step commands with increased time Safety/Judgement: Decreased awareness of safety, Decreased awareness of deficits Awareness: Emergent Problem Solving: Slow processing, Requires verbal cues, Requires tactile cues       General Comments  vital signs monitored, appear stable throughout    Exercises Other Exercises Other Exercises: edu re: role of OT, role of rehab, discharge recommendations, home safety, falls prevention, DME use, safe ADL completion   Shoulder Instructions      Home Living Family/patient expects to be discharged to:: Private residence Living Arrangements: Other relatives (sister) Available Help at Discharge: Family;Available PRN/intermittently (pt reports sister boyfriend is with patient frequently throughout the day, sister works 8-6 during the day) Type of Home: Apartment Home Access: Stairs to enter [employer] of Steps: flight Entrance Stairs-Rails: Right;Left Home Layout: One level     Bathroom Shower/Tub: Tub/shower unit         Home Equipment: Conservation officer, nature (2 wheels);Cane - single point;Shower seat          Prior Functioning/Environment Prior Level of Function : Independent/Modified Independent             Mobility Comments: reports furniture walking, has a RW to use; pt reports sisters boyfriend Elberta Fortis assists with steps into/out of apartment; ADLs Comments: MOD I for ADL, assist with IADL for grocery shopping, cooking        OT Problem List: Decreased strength;Decreased activity tolerance;Impaired balance (sitting and/or standing);Decreased cognition;Decreased knowledge of use of DME or AE;Decreased safety awareness      OT Treatment/Interventions: Self-care/ADL training;Patient/family education;Therapeutic exercise;Balance training;Energy conservation;Therapeutic activities;DME and/or AE instruction;Cognitive remediation/compensation    OT Goals(Current goals can be found in the care plan section) Acute Rehab OT Goals Patient Stated Goal: go home OT Goal Formulation: With patient Time For Goal Achievement: 09/19/22 Potential to Achieve Goals: Good ADL Goals Pt Will Perform Grooming: with supervision;sitting;standing Pt Will Perform Lower Body Dressing: with supervision;sit to/from stand Pt Will Transfer to Toilet: with supervision;ambulating Pt Will Perform Toileting - Clothing Manipulation and hygiene: with supervision;sit to/from stand  OT Frequency: Min 2X/week    Co-evaluation              AM-PAC OT "6 Clicks" Daily Activity     Outcome Measure Help from another person eating meals?: None Help from another person taking care of personal grooming?: A Little Help from another person  toileting, which includes using toliet, bedpan, or urinal?: A Lot Help from another person bathing (including washing, rinsing, drying)?: A Little Help from  another person to put on and taking off regular upper body clothing?: None Help from another person to put on and taking off regular lower body clothing?: A Little 6 Click Score: 19   End of Session Equipment Utilized During Treatment: Rolling walker (2 wheels) Nurse Communication: Mobility status  Activity Tolerance: Patient tolerated treatment well Patient left: in chair;with call bell/phone within reach;with chair alarm set  OT Visit Diagnosis: Unsteadiness on feet (R26.81);Muscle weakness (generalized) (M62.81)                Time: 8403-3533 OT Time Calculation (min): 18 min Charges:  OT General Charges $OT Visit: 1 Visit OT Evaluation $OT Eval Moderate Complexity: 1 Mod  Shanon Payor, OTD OTR/L  09/05/22, 10:09 AM

## 2022-09-05 NOTE — Progress Notes (Signed)
Patient is agreeable to go to [facility] SNF if he can go to [facility], He has been to [facility] in Oct.  Reaching out to see if [facility] can offer a bed

## 2022-09-05 NOTE — NC FL2 (Signed)
[place] LEVEL OF CARE FORM     IDENTIFICATION  Patient Name: Albert Figueroa Birthdate: 10/21/1955 Sex: male Admission Date (Current Location): 09/01/2022  [facility] and [place] Number:  Engineering geologist and Address:  [facility], [address], [place], [place] [address]      Provider Number: [id number]  Attending Physician Name and Address:  Annita Brod, MD  Relative Name and Phone Number:  Olegario Shearer [phone]    Current Level of Care: Hospital Recommended Level of Care: [place] Prior Approval Number:    Date Approved/Denied:   PASRR Number: [id number]  Discharge Plan: SNF    Current Diagnoses: Patient Active Problem List   Diagnosis Date Noted   Hypoglycemia 09/03/2022   Hypotension 09/02/2022   Blepharitis of right lower eyelid 09/02/2022   Dyslipidemia 09/02/2022   Pulmonary hypertension ([place]) 09/02/2022   Depression 09/02/2022   GERD without esophagitis 27/25/3664   Metabolic acidosis 40/34/7425   Acute kidney injury superimposed on chronic kidney disease ([place]) 09/01/2022   Intractable pain 07/05/2022   Type II diabetes mellitus with renal manifestations ([place]) 07/05/2022   Over weight 07/05/2022   Thrombocytopenia ([place]) 07/05/2022   Fall at home, initial encounter 07/05/2022   Cellulitis of right hand 07/05/2022   Chronic obstructive pulmonary disease (COPD) ([place]) 07/05/2022   Iron deficiency anemia 07/05/2022   Toxic encephalopathy 07/05/2022   Neuropathy 07/05/2022   Right wrist pain    Chronic pain syndrome 01/01/2021   Pharmacologic therapy 01/01/2021   Disorder of skeletal system 01/01/2021   Problems influencing health status 01/01/2021   Aortic atherosclerosis ([place]) 02/24/2020   Other emphysema ([place]) 02/24/2020   Medicare annual wellness visit, initial 02/03/2020   Neck pain 09/15/2018   Sacroiliac joint pain 09/08/2018   Lumbar spondylosis 06/11/2018   Left hip pain 06/11/2018    Chronic pain of both knees 06/11/2018   CKD (chronic kidney disease) stage 4, GFR 15-29 ml/min (HCC) 10/29/2017   Obesity (BMI 30-39.9) 11/27/2015   Tobacco use 06/13/2015   Essential hypertension 06/13/2015   Mixed hyperlipidemia 06/13/2015   Osteoarthritis 06/13/2015   Biceps tendinitis 04/06/2015   Epilepsy without status epilepticus, not intractable ([place]) 12/10/2013    Orientation RESPIRATION BLADDER Height & Weight     Self, Time, Situation, Place  Normal Continent Weight: 91 kg Height:  5\' 11"  (180.3 cm)  BEHAVIORAL SYMPTOMS/MOOD NEUROLOGICAL BOWEL NUTRITION STATUS      Continent Diet (See DC summary)  AMBULATORY STATUS COMMUNICATION OF NEEDS Skin   Extensive Assist Verbally Normal                       Personal Care Assistance Level of Assistance  Bathing, Feeding, Dressing Bathing Assistance: Limited assistance Feeding assistance: Limited assistance Dressing Assistance: Limited assistance     Functional Limitations Info             SPECIAL CARE FACTORS FREQUENCY  PT (By licensed PT), OT (By licensed OT)     PT Frequency: 5 times per week OT Frequency: 5 times per week            Contractures Contractures Info: Not present    Additional Factors Info  Code Status, Allergies Code Status Info: full Allergies Info: Gabapentin, Amlodipine           Current Medications (09/05/2022):  This is the current hospital active medication list Current Facility-Administered Medications  Medication Dose Route Frequency Provider Last Rate Last Admin  0.9 %  sodium chloride infusion   Intravenous Continuous Annita Brod, MD 100 mL/hr at 09/05/22 0236 New Bag at 09/05/22 0236   acetaminophen (TYLENOL) tablet 650 mg  650 mg Oral Q6H PRN Mansy, Jan A, MD       Or   acetaminophen (TYLENOL) suppository 650 mg  650 mg Rectal Q6H PRN Mansy, Jan A, MD       cyclobenzaprine (FLEXERIL) tablet 10 mg  10 mg Oral TID PRN Mansy, Jan A, MD       divalproex  (DEPAKOTE ER) 24 hr tablet 1,000 mg  1,000 mg Oral BID Mansy, Jan A, MD   1,000 mg at 09/05/22 1115   DULoxetine (CYMBALTA) DR capsule 30 mg  30 mg Oral Daily Mansy, Jan A, MD   30 mg at 09/05/22 1102   fenofibrate tablet 160 mg  160 mg Oral Daily Mansy, Jan A, MD   160 mg at 09/05/22 1102   ferrous sulfate tablet 325 mg  325 mg Oral Q breakfast Mansy, Jan A, MD   325 mg at 09/05/22 1102   heparin injection 5,000 Units  5,000 Units Subcutaneous Q8H Renda Rolls, RPH   5,000 Units at 09/05/22 0606   hydrOXYzine (ATARAX) tablet 10 mg  10 mg Oral TID PRN Mansy, Jan A, MD       ipratropium-albuterol (DUONEB) 0.5-2.5 (3) MG/3ML nebulizer solution 3 mL  3 mL Nebulization Q4H PRN Belue, Alver Sorrow, RPH       lisinopril (ZESTRIL) tablet 20 mg  20 mg Oral Daily Renda Rolls, RPH   20 mg at 09/05/22 1103   magnesium hydroxide (MILK OF MAGNESIA) suspension 30 mL  30 mL Oral Daily PRN Mansy, Jan A, MD       ondansetron [facility]) tablet 4 mg  4 mg Oral Q6H PRN Mansy, Jan A, MD       Or   ondansetron [facility]) injection 4 mg  4 mg Intravenous Q6H PRN Mansy, Jan A, MD       oxyCODONE (Oxy IR/ROXICODONE) immediate release tablet 20 mg  20 mg Oral Q8H PRN Nicole Kindred A, DO       pantoprazole (PROTONIX) EC tablet 40 mg  40 mg Oral Daily Mansy, Jan A, MD   40 mg at 09/05/22 1103   roflumilast (DALIRESP) tablet 250 mcg  250 mcg Oral QHS Mansy, Jan A, MD   250 mcg at 09/04/22 2213   rosuvastatin (CRESTOR) tablet 20 mg  20 mg Oral QHS Mansy, Jan A, MD   20 mg at 09/04/22 2213   tiZANidine (ZANAFLEX) tablet 4 mg  4 mg Oral QHS Mansy, Jan A, MD   4 mg at 09/04/22 2213   traMADol (ULTRAM) tablet 50 mg  50 mg Oral Q6H PRN Annita Brod, MD       traZODone (DESYREL) tablet 25 mg  25 mg Oral QHS PRN Mansy, Arvella Merles, MD         Discharge Medications: Please see discharge summary for a list of discharge medications.  Relevant Imaging Results:  Relevant Lab Results:   Additional Information SS #: [id number]  [id number]  Ciaran Begay Rossie Muskrat, RN

## 2022-09-06 ENCOUNTER — Other Ambulatory Visit: Payer: Self-pay

## 2022-09-06 DIAGNOSIS — E663 Overweight: Secondary | ICD-10-CM | POA: Diagnosis not present

## 2022-09-06 DIAGNOSIS — D61818 Other pancytopenia: Secondary | ICD-10-CM | POA: Diagnosis not present

## 2022-09-06 DIAGNOSIS — I10 Essential (primary) hypertension: Secondary | ICD-10-CM | POA: Diagnosis not present

## 2022-09-06 DIAGNOSIS — N189 Chronic kidney disease, unspecified: Secondary | ICD-10-CM | POA: Diagnosis not present

## 2022-09-06 DIAGNOSIS — N179 Acute kidney failure, unspecified: Secondary | ICD-10-CM | POA: Diagnosis not present

## 2022-09-06 LAB — BASIC METABOLIC PANEL
Anion gap: 2 — ABNORMAL LOW (ref 5–15)
BUN: 28 mg/dL — ABNORMAL HIGH (ref 8–23)
CO2: 21 mmol/L — ABNORMAL LOW (ref 22–32)
Calcium: 8.5 mg/dL — ABNORMAL LOW (ref 8.9–10.3)
Chloride: 122 mmol/L — ABNORMAL HIGH (ref 98–111)
Creatinine, Ser: 1.55 mg/dL — ABNORMAL HIGH (ref 0.61–1.24)
GFR, Estimated: 49 mL/min — ABNORMAL LOW (ref >60)
Glucose, Bld: 84 mg/dL (ref 70–99)
Potassium: 4.2 mmol/L (ref 3.5–5.1)
Sodium: 145 mmol/L (ref 135–145)

## 2022-09-06 LAB — IRON AND TIBC
Iron: 96 ug/dL (ref 45–182)
Saturation Ratios: 33 % (ref 17.9–39.5)
TIBC: 288 ug/dL (ref 250–450)
UIBC: 192 ug/dL

## 2022-09-06 LAB — GLUCOSE, CAPILLARY: Glucose-Capillary: 92 mg/dL (ref 70–99)

## 2022-09-06 LAB — FERRITIN: Ferritin: 233 ng/mL (ref 24–336)

## 2022-09-06 LAB — CBC
HCT: 27.9 % — ABNORMAL LOW (ref 39.0–52.0)
Hemoglobin: 8.8 g/dL — ABNORMAL LOW (ref 13.0–17.0)
MCH: 32.1 pg (ref 26.0–34.0)
MCHC: 31.5 g/dL (ref 30.0–36.0)
MCV: 101.8 fL — ABNORMAL HIGH (ref 80.0–100.0)
Platelets: 78 10*3/uL — ABNORMAL LOW (ref 150–400)
RBC: 2.74 MIL/uL — ABNORMAL LOW (ref 4.22–5.81)
RDW: 14.1 % (ref 11.5–15.5)
WBC: 2.3 10*3/uL — ABNORMAL LOW (ref 4.0–10.5)
nRBC: 0 % (ref 0.0–0.2)

## 2022-09-06 LAB — CULTURE, BLOOD (ROUTINE X 2)
Culture: NO GROWTH
Special Requests: ADEQUATE

## 2022-09-06 LAB — LACTATE DEHYDROGENASE: LDH: 207 U/L — ABNORMAL HIGH (ref 98–192)

## 2022-09-06 LAB — FOLATE: Folate: 7.4 ng/mL (ref >5.9)

## 2022-09-06 MED ORDER — KETOTIFEN FUMARATE 0.035 % OP SOLN
1.0000 [drp] | Freq: Two times a day (BID) | OPHTHALMIC | Status: DC
  Administered 2022-09-06 – 2022-09-09 (×7): 1 [drp] via OPHTHALMIC
  Filled 2022-09-06: qty 5

## 2022-09-06 MED ORDER — HYDROCHLOROTHIAZIDE 25 MG PO TABS
25.0000 mg | ORAL_TABLET | Freq: Every day | ORAL | Status: DC
  Administered 2022-09-06 – 2022-09-09 (×4): 25 mg via ORAL
  Filled 2022-09-06 (×4): qty 1

## 2022-09-06 NOTE — Care Management Important Message (Signed)
Important Message  Patient Details  Name: Albert Figueroa MRN: [id number] Date of Birth: July 13, 1956   Medicare Important Message Given:  Yes     Dannette Barbara 09/06/2022, 11:25 AM

## 2022-09-06 NOTE — TOC Progression Note (Addendum)
Transition of Care [facility]) - Progression Note    Patient Details  Name: Albert Figueroa MRN: [id number] Date of Birth: Jul 20, 1956  Transition of Care [facility]) CM/SW [place], RN Phone Number: 09/06/2022, 9:17 AM  Clinical Narrative:    Spoke with the patient, let him know that his first choice [facility] has made a bed offer, he accepted the offer, I notified [name], Ins pending ref number [id number]   Expected Discharge Plan: [place] Barriers to Discharge: SNF Pending bed offer, Insurance Authorization  Expected Discharge Plan and Services Expected Discharge Plan: [place]   Discharge Planning Services: CM Consult   Living arrangements for the past 2 months: Single Family Home, Apartment                 DME Arranged: N/A DME Agency: NA                   Social Determinants of Health (SDOH) Interventions    Readmission Risk Interventions     No data to display

## 2022-09-06 NOTE — TOC Progression Note (Signed)
Transition of Care [facility]) - Progression Note    Patient Details  Name: Albert Figueroa MRN: [id number] Date of Birth: 02/24/1956  Transition of Care [facility]) CM/SW [place], RN Phone Number: 09/06/2022, 1:44 PM  Clinical Narrative:    Ins auth still pending   Expected Discharge Plan: [place] Barriers to Discharge: SNF Pending bed offer, Insurance Authorization  Expected Discharge Plan and Services Expected Discharge Plan: [place]   Discharge Planning Services: CM Consult   Living arrangements for the past 2 months: Single Family Home, Apartment                 DME Arranged: N/A DME Agency: NA                   Social Determinants of Health (SDOH) Interventions    Readmission Risk Interventions     No data to display

## 2022-09-06 NOTE — Progress Notes (Signed)
Progress Note   Patient: Albert Figueroa:[id number] DOB: 1955/12/01 DOA: 09/01/2022     5 DOS: the patient was seen and examined on 09/06/2022   Brief hospital course: [name] is a 66 y.o. Caucasian male with medical history significant for stage IIIb chronic kidney disease, hypertension, dyslipidemia, seizure disorder, osteoarthritis, lumbar stenosis and type 2 diabetes mellitus, who presented on evening of 09/01/2022 for evaluation of worsening confusion and somnolence with hypoxemia of 88% on room air.  He was unable to provide history due to being lethargic.  Wife noted no urine output day of admission.    Found to have acute kidney injury with creatinine of 7.5.  Nephrology consulted and patient started on aggressive IV fluid resuscitation and over time, patient's renal function and mentation have continued to improve.  By 12/7, renal function back to normal.  Seen by physical therapy who are recommending skilled nursing.  Awaiting insurance authorization.  Assessment and Plan: * Acute kidney injury superimposed on stage IIIb chronic kidney disease (HCC)-resolved as of 44/0/1027 Metabolic acidosis due to renal failure.  Nephrology following.  Renal function has since normalized.  Pancytopenia ([place]) Unclear etiology.  Patient has a long history of microcytic anemia likely from his chronic renal disease, but platelet count has been trending downward as has a white blood cell count.  Recheck labs in the morning.  Chronic pain syndrome Involving shoulder, back, both knees.   --Resume home oxycodone with BP hold parameter, also hold if pt lethargic  Acute metabolic encephalopathy-resolved as of 09/04/2022 Much improved, alert and oriented x 3, appropriate   Blepharitis of right lower eyelid Tobramycin ointment to lower eyelid.  Hypotension-resolved as of 09/06/2022 POA, likely due to hypovolemia.  Family has resolved and blood pressures are trending up, so we will discontinue IV  fluids  Dyslipidemia Continue statin and fenofibrate.  Essential hypertension Initially held lisinopril HCTZ given acute kidney injury and hypotension on admission.  Blood pressure finally trending upward today, so we will slowly restart home medications  Type II diabetes mellitus with renal manifestations ([place]) Resume sliding scale when indicated, d/c'd due to hypoglycemia and borderline CBG's Hold Farxiga due to borderline glucose, avoid hypoglycemia.  Chronic obstructive pulmonary disease (COPD) (HCC) Stable, not exacerbated. Continue Trelegy equivalent and Combivent.  Over weight Meets criteria with BMI greater than 25  Hypoglycemia Due to AMS/lethargy and NPO status on admission.   --Hypoglycemia protocol --Wilder Glade and sliding scale coverage on hold  Metabolic acidosis See AKI  GERD without esophagitis Continue PPI.  Depression Continue Cymbalta and Depakote ER.  Pulmonary hypertension ([place]) Continue  Daliresp.        Subjective: No complaints.  Physical Exam: Vitals:   09/05/22 0921 09/05/22 1748 09/06/22 0019 09/06/22 0830  BP: 131/77 129/84 116/69 (!) 151/90  Pulse: 67 63 (!) 59 70  Resp: 16 16 18 16   Temp: 97.8 F (36.6 C) 97.8 F (36.6 C) 97.9 F (36.6 C) 98 F (36.7 C)  TempSrc:      SpO2: 100% 100% 100% 99%  Weight:      Height:       General exam: Oriented x 3, no acute distress HEENT: Mucous membranes slightly dry Respiratory system: Clear to auscultation bilaterally Cardiovascular system: Regular rate and rhythm, S1-S2 Gastrointestinal system: Soft, nontender, nondistended with positive bowel sounds Central nervous system: No focal deficits Extremities: No clubbing or cyanosis or edema Skin: No skin breaks, tears or lesions Psychiatry: Appropriate, no evidence of psychoses   Data Reviewed:  Sodium 145.  Creatinine 1.55 with GFR 49.  Remaining pancytopenic with white blood cell count of 2.3, hemoglobin 8.8 with MCV of 102 and  platelet count of 78.   Family Communication: Will call sister  Disposition: Status is: Inpatient Remains inpatient appropriate because:  -Awaiting insurance approval for skilled nursing   Planned Discharge Destination: Skilled nursing facility     Author: Annita Brod, MD 09/06/2022 2:48 PM  For on call review [email or web address].

## 2022-09-06 NOTE — Progress Notes (Signed)
Physical Therapy Treatment Patient Details Name: Albert Figueroa MRN: [id number] DOB: 1955/11/29 Today's Date: 09/06/2022   History of Present Illness Pt is a 66 year old male Admitted with AKI after presenting to ED with worsening confusion and somnolence with hypoxemia of 88% on room air; PMH significant for stage IIIb chronic kidney disease, hypertension, dyslipidemia, seizure disorder, osteoarthritis, lumbar stenosis and type 2 diabetes mellitus    PT Comments    Patient progressing towards physical therapy goals. Increasing ambulation distance this session with use of RW and supervision. No LOB noted throughout. Able to take steps in room with no AD and min guard but unsteady and guarded gait. Continue to recommend SNF for ongoing Physical Therapy.       Recommendations for follow up therapy are one component of a multi-disciplinary discharge planning process, led by the attending physician.  Recommendations may be updated based on patient status, additional functional criteria and insurance authorization.  Follow Up Recommendations  Skilled nursing-short term rehab (<3 hours/day) Can patient physically be transported by private vehicle: Yes   Assistance Recommended at Discharge Frequent or constant Supervision/Assistance  Patient can return home with the following A little help with walking and/or transfers;A little help with bathing/dressing/bathroom;Assistance with cooking/housework;Direct supervision/assist for medications management;Direct supervision/assist for financial management;Assist for transportation;Help with stairs or ramp for entrance   Equipment Recommendations  Rolling Alin Chavira (2 wheels)    Recommendations for Other Services       Precautions / Restrictions Precautions Precautions: Fall Restrictions Weight Bearing Restrictions: No     Mobility  Bed Mobility Overal bed mobility: Needs Assistance Bed Mobility: Supine to Sit, Sit to Supine     Supine to sit:  Supervision Sit to supine: Min assist   General bed mobility comments: assist for LE management back into bed    Transfers Overall transfer level: Needs assistance Equipment used: Rolling Clarabelle Oscarson (2 wheels) Transfers: Sit to/from Stand Sit to Stand: Supervision           General transfer comment: supervision for safety    Ambulation/Gait Ambulation/Gait assistance: Supervision Gait Distance (Feet): 180 Feet Assistive device: Rolling Juliannah Ohmann (2 wheels) Gait Pattern/deviations: Step-through pattern, Decreased stride length, Trunk flexed Gait velocity: decreased     General Gait Details: supervision for safety. No LOB noted   Stairs             Wheelchair Mobility    Modified Rankin (Stroke Patients Only)       Balance Overall balance assessment: Needs assistance Sitting-balance support: No upper extremity supported, Feet supported Sitting balance-Leahy Scale: Good     Standing balance support: No upper extremity supported, During functional activity Standing balance-Leahy Scale: Fair Standing balance comment: able to take few steps towards [facility] without AD                            Cognition Arousal/Alertness: Awake/alert Behavior During Therapy: WFL for tasks assessed/performed Overall Cognitive Status: No family/caregiver present to determine baseline cognitive functioning Area of Impairment: Orientation, Attention, Memory, Following commands, Safety/judgement, Awareness, Problem solving                 Orientation Level: Disoriented to, Time Current Attention Level: Sustained Memory: Decreased short-term memory Following Commands: Follows one step commands with increased time Safety/Judgement: Decreased awareness of safety, Decreased awareness of deficits Awareness: Emergent Problem Solving: Slow processing, Requires verbal cues, Requires tactile cues          Exercises  General Comments        Pertinent Vitals/Pain  Pain Assessment Pain Assessment: No/denies pain    Home Living                          Prior Function            PT Goals (current goals can now be found in the care plan section) Acute Rehab PT Goals PT Goal Formulation: With patient Time For Goal Achievement: 09/18/22 Potential to Achieve Goals: Good Progress towards PT goals: Progressing toward goals    Frequency    Min 2X/week      PT Plan Current plan remains appropriate    Co-evaluation              AM-PAC PT "6 Clicks" Mobility   Outcome Measure  Help needed turning from your back to your side while in a flat bed without using bedrails?: A Little Help needed moving from lying on your back to sitting on the side of a flat bed without using bedrails?: A Little Help needed moving to and from a bed to a chair (including a wheelchair)?: A Little Help needed standing up from a chair using your arms (e.g., wheelchair or bedside chair)?: A Little Help needed to walk in hospital room?: A Little Help needed climbing 3-5 steps with a railing? : A Lot 6 Click Score: 17    End of Session Equipment Utilized During Treatment: Gait belt Activity Tolerance: Patient tolerated treatment well Patient left: in bed;with bed alarm set;with call bell/phone within reach Nurse Communication: Mobility status PT Visit Diagnosis: Unsteadiness on feet (R26.81);Muscle weakness (generalized) (M62.81);Difficulty in walking, not elsewhere classified (R26.2)     Time: 3419-6222 PT Time Calculation (min) (ACUTE ONLY): 25 min  Charges:  $Therapeutic Activity: 23-37 mins                     Sindia Kowalczyk A. [place] Rile PT, DPT [facility] - Acute Rehabilitation Services    Reni Hausner A Chesley Valls 09/06/2022, 3:20 PM

## 2022-09-06 NOTE — Consult Note (Signed)
Albert Figueroa  [phone]) [phone] [phone]) [phone]  ID: Albert Figueroa OB: 24-Apr-1956  MR#: [id number]  MOQ#:[id number]  Patient Care Team: Dion Body, MD as PCP - General (Family Medicine)  CHIEF COMPLAINT: Pancytopenia.  INTERVAL HISTORY: Patient is a 66 year old male who was initially admitted to the hospital with acute on chronic renal failure as well as hypoxemia with oxygen saturations of 88%.  Patient had aggressive IV fluid resuscitation with significant improvement of his renal function.  During his hospitalization, he was noted to have a declining white blood cell count, hemoglobin, and platelet count.  He currently feels well and improved since admission.  He admits to increased weakness and fatigue.  He has no neurologic complaints.  He denies any recent fevers or illnesses.  He has a fair appetite, but denies weight loss.  He has no chest pain, shortness of breath, cough, or hemoptysis.  He denies any nausea, vomiting, constipation, or diarrhea.  He has no urinary complaints.  Patient offers no further specific complaints today.  REVIEW OF SYSTEMS:   Review of Systems  Constitutional:  Positive for malaise/fatigue. Negative for fever and weight loss.  Respiratory: Negative.  Negative for cough, hemoptysis and shortness of breath.   Cardiovascular: Negative.  Negative for chest pain and leg swelling.  Gastrointestinal: Negative.  Negative for abdominal pain.  Genitourinary: Negative.  Negative for dysuria and hematuria.  Musculoskeletal: Negative.  Negative for back pain.  Skin: Negative.  Negative for rash.  Neurological:  Positive for weakness. Negative for dizziness, focal weakness and headaches.  Psychiatric/Behavioral: Negative.  The patient is not nervous/anxious.     As per HPI. Otherwise, a complete review of systems is negative.  PAST MEDICAL HISTORY: Past Medical History:  Diagnosis Date   Arthritis    Diabetes mellitus without  complication (HCC)    Emphysema/COPD ([place])    Hyperlipidemia    Hypertension    Kidney disease    stage 3   Lumbar stenosis    Osteoarthrosis    Seizure ([place])     PAST SURGICAL HISTORY: Past Surgical History:  Procedure Laterality Date   KNEE SURGERY     multiple surgeries on both knees    FAMILY HISTORY: Family History  Problem Relation Age of Onset   Diabetes Mother    Cancer Mother     ADVANCED DIRECTIVES (Y/N):  _0 @  HEALTH MAINTENANCE: Social History   Tobacco Use   Smoking status: Every Day    Packs/day: 2.00    Years: 48.00    Total pack years: 96.00    Types: Cigarettes   Smokeless tobacco: Never   Tobacco comments:    has cut back to 1 PPD  Vaping Use   Vaping Use: Never used  Substance Use Topics   Alcohol use: No   Drug use: No     Colonoscopy:  PAP:  Bone density:  Lipid panel:  Allergies  Allergen Reactions   Gabapentin Other (See Comments)    Unknown reaction (Per Significant Other)   Amlodipine     Other reaction(s): Other (see comments) Caused elevated blood pressure and headaches    Current Facility-Administered Medications  Medication Dose Route Frequency Provider Last Rate Last Admin   acetaminophen (TYLENOL) tablet 650 mg  650 mg Oral Q6H PRN Mansy, Jan A, MD       Or   acetaminophen (TYLENOL) suppository 650 mg  650 mg Rectal Q6H PRN Mansy, Arvella Merles, MD       cyclobenzaprine (FLEXERIL)  tablet 10 mg  10 mg Oral TID PRN Mansy, Jan A, MD   10 mg at 09/06/22 1558   divalproex (DEPAKOTE ER) 24 hr tablet 1,000 mg  1,000 mg Oral BID Mansy, Jan A, MD   1,000 mg at 09/06/22 0946   DULoxetine (CYMBALTA) DR capsule 30 mg  30 mg Oral Daily Mansy, Jan A, MD   30 mg at 09/06/22 0946   fenofibrate tablet 160 mg  160 mg Oral Daily Mansy, Jan A, MD   160 mg at 09/06/22 9562   ferrous sulfate tablet 325 mg  325 mg Oral Q breakfast Mansy, Jan A, MD   325 mg at 09/06/22 0946   heparin injection 5,000 Units  5,000 Units Subcutaneous Q8H Renda Rolls, RPH   5,000 Units at 09/06/22 1550   hydrochlorothiazide (HYDRODIURIL) tablet 25 mg  25 mg Oral Daily Annita Brod, MD   25 mg at 09/06/22 1550   hydrOXYzine (ATARAX) tablet 10 mg  10 mg Oral TID PRN Mansy, Jan A, MD       ipratropium-albuterol (DUONEB) 0.5-2.5 (3) MG/3ML nebulizer solution 3 mL  3 mL Nebulization Q4H PRN Renda Rolls, RPH       ketotifen (ZADITOR) 0.035 % ophthalmic solution 1 drop  1 drop Both Eyes BID Annita Brod, MD   1 drop at 09/06/22 1552   lisinopril (ZESTRIL) tablet 20 mg  20 mg Oral Daily Renda Rolls, RPH   20 mg at 09/06/22 0946   magnesium hydroxide (MILK OF MAGNESIA) suspension 30 mL  30 mL Oral Daily PRN Mansy, Jan A, MD       ondansetron [facility]) tablet 4 mg  4 mg Oral Q6H PRN Mansy, Jan A, MD       Or   ondansetron [facility]) injection 4 mg  4 mg Intravenous Q6H PRN Mansy, Jan A, MD       oxyCODONE (Oxy IR/ROXICODONE) immediate release tablet 20 mg  20 mg Oral Q8H PRN Nicole Kindred A, DO   20 mg at 09/06/22 0950   pantoprazole (PROTONIX) EC tablet 40 mg  40 mg Oral Daily Mansy, Jan A, MD   40 mg at 09/06/22 0946   roflumilast (DALIRESP) tablet 250 mcg  250 mcg Oral QHS Mansy, Jan A, MD   250 mcg at 09/05/22 2136   rosuvastatin (CRESTOR) tablet 20 mg  20 mg Oral QHS Mansy, Jan A, MD   20 mg at 09/05/22 2136   tiZANidine (ZANAFLEX) tablet 4 mg  4 mg Oral QHS Mansy, Jan A, MD   4 mg at 09/05/22 2136   traMADol (ULTRAM) tablet 50 mg  50 mg Oral Q6H PRN Annita Brod, MD   50 mg at 09/06/22 0126   traZODone (DESYREL) tablet 25 mg  25 mg Oral QHS PRN Mansy, Jan A, MD        OBJECTIVE: Vitals:   09/06/22 0019 09/06/22 0830  BP: 116/69 (!) 151/90  Pulse: (!) 59 70  Resp: 18 16  Temp: 97.9 F (36.6 C) 98 F (36.7 C)  SpO2: 100% 99%     Body mass index is 27.98 kg/m.    ECOG FS:0 - Asymptomatic  General: Well-developed, well-nourished, no acute distress. Eyes: Pink conjunctiva, anicteric sclera. HEENT: Normocephalic, moist  mucous membranes. Lungs: No audible wheezing or coughing. Heart: Regular rate and rhythm. Abdomen: Soft, nontender, no obvious distention. Musculoskeletal: No edema, cyanosis, or clubbing. Neuro: Alert, answering all questions appropriately. Cranial nerves grossly intact. Skin:  No rashes or petechiae noted. Psych: Normal affect. Lymphatics: No cervical, calvicular, axillary or inguinal LAD.   LAB RESULTS:  Lab Results  Component Value Date   NA 145 09/06/2022   K 4.2 09/06/2022   CL 122 (H) 09/06/2022   CO2 21 (L) 09/06/2022   GLUCOSE 84 09/06/2022   BUN 28 (H) 09/06/2022   CREATININE 1.55 (H) 09/06/2022   CALCIUM 8.5 (L) 09/06/2022   PROT 6.6 09/01/2022   ALBUMIN 2.7 (L) 09/01/2022   AST 65 (H) 09/01/2022   ALT 23 09/01/2022   ALKPHOS 61 09/01/2022   BILITOT 1.1 09/01/2022   GFRNONAA 49 (L) 09/06/2022   GFRAA >60 10/06/2012    Lab Results  Component Value Date   WBC 2.3 (L) 09/06/2022   NEUTROABS 1.2 (L) 09/05/2022   HGB 8.8 (L) 09/06/2022   HCT 27.9 (L) 09/06/2022   MCV 101.8 (H) 09/06/2022   PLT 78 (L) 09/06/2022     STUDIES: US RENAL  Result Date: 09/02/2022 CLINICAL DATA:  Acute kidney injury EXAM: RENAL / URINARY TRACT ULTRASOUND COMPLETE COMPARISON:  None Available. FINDINGS: Right Kidney: Renal measurements: 11.1 x 6.3 x 5.4 cm = volume: 197.15 mL. Echogenicity within normal limits. No solid mass or hydronephrosis visualized. 7.2 x 7.7 x 6.8 cm right upper pole renal cyst. 3.8 x 3.3 x 3.8 cm right lower pole renal cyst. Left Kidney: Renal measurements: 11.1 x 6.5 x 6.4 cm = volume: 241.65 mL. Echogenicity within normal limits. No solid mass or hydronephrosis visualized. 3.8 x 3.4 x 3.6 cm left upper pole renal cyst. Bladder: Appears normal for degree of bladder distention. Other: None. IMPRESSION: 1. No obstructive uropathy. 2. Bilateral renal cysts. Electronically Signed   By: Kathreen Devoid M.D.   On: 09/02/2022 10:51   CT Head Wo Contrast  Result Date:  09/01/2022 CLINICAL DATA:  Altered mental status EXAM: CT HEAD WITHOUT CONTRAST TECHNIQUE: Contiguous axial images were obtained from the base of the skull through the vertex without intravenous contrast. RADIATION DOSE REDUCTION: This exam was performed according to the departmental dose-optimization program which includes automated exposure control, adjustment of the mA and/or kV according to patient size and/or use of iterative reconstruction technique. COMPARISON:  Head CT 07/05/2022.  MRI brain 06/15/2021. FINDINGS: Brain: No acute hemorrhage, acute infarct or acute extra-axial fluid collection identified. No midline shift or mass effect. Encephalomalacia in the right frontal lobe is unchanged. Focal left frontal lobe periventricular white matter hypodensity is unchanged. The ventricles are nondilated and stable in size. Right parietal approach ventriculostomy catheter ends near midline in the right lateral ventricle, unchanged from prior. Basal cisterns are patent. Vascular: Atherosclerotic calcifications are present within the cavernous internal carotid arteries. Skull: Normal. Negative for fracture or focal lesion. Sinuses/Orbits: No acute finding. Other: None. IMPRESSION: 1. No acute intracranial process. 2. Unchanged position of right parietal approach ventriculostomy catheter. No evidence of hydrocephalus. Electronically Signed   By: Ronney Asters M.D.   On: 09/01/2022 22:06   DG Chest Port 1 View  Result Date: 09/01/2022 CLINICAL DATA:  Decreased oxygen saturation. EXAM: PORTABLE CHEST 1 VIEW COMPARISON:  June 15, 2021 FINDINGS: The heart size and mediastinal contours are within normal limits. Intact ventriculoperitoneal shunt tubing is seen on the right. Low lung volumes are noted with mild areas of atelectasis and/or infiltrate seen within the bilateral lung bases. There is no evidence of a pleural effusion or pneumothorax. Multilevel degenerative changes are seen throughout the thoracic  spine. IMPRESSION: Low lung volumes with mild  bibasilar atelectasis and/or infiltrate. Electronically Signed   By: Virgina Norfolk M.D.   On: 09/01/2022 19:08    ASSESSMENT: Pancytopenia.  PLAN:    Leukopenia: Patient noted to have a normal white blood cell count on admission on September 02, 2022 which is now trended down to 2.3.  Suspect partially dilutional given the aggressive IV fluid resuscitation.  Will initiate workup today for completeness.  No bone marrow biopsy is necessary at this point. Thrombocytopenia: Patient's platelet count was mildly decreased at 115 upon admission and is now trended down to 78.  Again suspect partially dilutional.  Laboratory workup pending. Anemia: Patient has a baseline anemia likely secondary to his underlying renal insufficiency, but again hemoglobin is trended down during this admission. Chronic renal insufficiency: Significantly improved patient's most recent creatinine is 1.55.  Appreciate consult, will follow.  Lloyd Huger, MD   09/06/2022 4:26 PM

## 2022-09-06 NOTE — Progress Notes (Signed)
   09/06/22 0900  Clinical Encounter Type  Visited With Patient  Visit Type Initial  Referral From Nurse  Consult/Referral To Chaplain   Chaplain responded to [facility] consult requesting information for AD. Booklet given and explained. Patient to inform Chaplain when completed to have notarized.

## 2022-09-06 NOTE — Assessment & Plan Note (Signed)
Unclear etiology.  Patient has a long history of microcytic anemia likely from his chronic renal disease, but platelet count has been trending downward as has a white blood cell count.  Recheck labs in the morning.

## 2022-09-07 LAB — VITAMIN B12: Vitamin B-12: 507 pg/mL (ref 180–914)

## 2022-09-07 LAB — CBC WITH DIFFERENTIAL/PLATELET
Abs Immature Granulocytes: 0.02 10*3/uL (ref 0.00–0.07)
Basophils Absolute: 0 10*3/uL (ref 0.0–0.1)
Basophils Relative: 2 %
Eosinophils Absolute: 0.1 10*3/uL (ref 0.0–0.5)
Eosinophils Relative: 5 %
HCT: 29.5 % — ABNORMAL LOW (ref 39.0–52.0)
Hemoglobin: 9.1 g/dL — ABNORMAL LOW (ref 13.0–17.0)
Immature Granulocytes: 1 %
Lymphocytes Relative: 54 %
Lymphs Abs: 1.4 10*3/uL (ref 0.7–4.0)
MCH: 31.6 pg (ref 26.0–34.0)
MCHC: 30.8 g/dL (ref 30.0–36.0)
MCV: 102.4 fL — ABNORMAL HIGH (ref 80.0–100.0)
Monocytes Absolute: 0.2 10*3/uL (ref 0.1–1.0)
Monocytes Relative: 10 %
Neutro Abs: 0.7 10*3/uL — ABNORMAL LOW (ref 1.7–7.7)
Neutrophils Relative %: 28 %
Platelets: 72 10*3/uL — ABNORMAL LOW (ref 150–400)
RBC: 2.88 MIL/uL — ABNORMAL LOW (ref 4.22–5.81)
RDW: 13.8 % (ref 11.5–15.5)
WBC: 2.5 10*3/uL — ABNORMAL LOW (ref 4.0–10.5)
nRBC: 0 % (ref 0.0–0.2)

## 2022-09-07 LAB — GLUCOSE, CAPILLARY
Glucose-Capillary: 81 mg/dL (ref 70–99)
Glucose-Capillary: 90 mg/dL (ref 70–99)
Glucose-Capillary: 74 mg/dL (ref 70–99)
Glucose-Capillary: 97 mg/dL (ref 70–99)
Glucose-Capillary: 111 mg/dL — ABNORMAL HIGH (ref 70–99)

## 2022-09-07 MED ORDER — ACETAMINOPHEN 325 MG PO TABS: 650.0000 mg | ORAL_TABLET | Freq: Four times a day (QID) | ORAL | Status: DC | PRN

## 2022-09-07 MED ORDER — OXYCODONE HCL 20 MG PO TABS: 1.0000 | ORAL_TABLET | Freq: Three times a day (TID) | ORAL | 0 refills | Status: DC | PRN

## 2022-09-07 NOTE — TOC Progression Note (Signed)
Transition of Care [facility]) - Progression Note    Patient Details  Name: Albert Figueroa MRN: [id number] Date of Birth: Dec 26, 1955  Transition of Care [facility]) CM/SW Contact  Izola Price, RN Phone Number: 09/07/2022, 2:20 PM  Clinical Narrative:  12/9: #[id number] Kendall Flack auth id. Checking with [facility] but cannot accept till Monday 09/09/22 per [facility].  Notified provider and patient. Simmie Davies RN CM      Expected Discharge Plan: Skilled Nursing Facility Barriers to Discharge: No SNF bed ([facility] will accept Monday 09/09/22. Ins authorizatio approved.)  Expected Discharge Plan and Services Expected Discharge Plan: [place]   Discharge Planning Services: CM Consult   Living arrangements for the past 2 months: [place], Apartment                 DME Arranged: N/A DME Agency: NA                   Social Determinants of Health (SDOH) Interventions    Readmission Risk Interventions     No data to display

## 2022-09-07 NOTE — TOC Progression Note (Signed)
Transition of Care [facility]) - Progression Note    Patient Details  Name: Albert Figueroa MRN: [id number] Date of Birth: [birth year]  Transition of Care [facility]) CM/SW Contact  Izola Price, RN Phone Number: 09/07/2022, 2:07 PM  Clinical Narrative:  12/9: #[id number] Kendall Flack auth id. Checking with [facility] on admission over the weekend. Simmie Davies RN CM    Expected Discharge Plan: Skilled Nursing Facility Barriers to Discharge: SNF Pending bed offer, Insurance Authorization  Expected Discharge Plan and Services Expected Discharge Plan: [place]   Discharge Planning Services: CM Consult   Living arrangements for the past 2 months: Single Family Home, Apartment                 DME Arranged: N/A DME Agency: NA                   Social Determinants of Health (SDOH) Interventions    Readmission Risk Interventions     No data to display

## 2022-09-07 NOTE — Progress Notes (Signed)
Progress Note   Patient: Albert Figueroa MRN:[id number] DOB: 05/12/1956 DOA: 09/01/2022     6 DOS: the patient was seen and examined on 09/07/2022   Brief hospital course: Mico W Hudler is a 66 y.o. Caucasian male with medical history significant for stage IIIb chronic kidney disease, hypertension, dyslipidemia, seizure disorder, osteoarthritis, lumbar stenosis and type 2 diabetes mellitus, who presented on evening of 09/01/2022 for evaluation of worsening confusion and somnolence with hypoxemia of 88% on room air.  He was unable to provide history due to being lethargic.  Wife noted no urine output day of admission.    Found to have acute kidney injury with creatinine of 7.5.  Nephrology consulted and patient started on aggressive IV fluid resuscitation and over time, patient's renal function and mentation have continued to improve.  By 12/7, renal function back to normal.  Seen by physical therapy who are recommending skilled nursing.  Patient to go on Monday, 12/11.  Assessment and Plan: * Acute kidney injury superimposed on stage IIIb chronic kidney disease (HCC)-resolved as of 09/05/2022 Metabolic acidosis due to renal failure.  Nephrology following.  Renal function has since normalized.  Pancytopenia (HCC) Seen by hematology.  Felt to be secondary dilutional given aggressive fluid resuscitation plus chronic renal insufficiency causing chronic anemia.  Bone marrow biopsy not felt to be needed at this time.  Chronic pain syndrome Involving shoulder, back, both knees.   --Resume home oxycodone with BP hold parameter, also hold if pt lethargic  Acute metabolic encephalopathy-resolved as of 09/04/2022 Much improved, alert and oriented x 3, appropriate   Blepharitis of right lower eyelid Tobramycin ointment to lower eyelid.  Hypotension-resolved as of 09/06/2022 POA, likely due to hypovolemia.  Family has resolved and blood pressures are trending up, so we will discontinue IV  fluids  Dyslipidemia Continue statin and fenofibrate.  Essential hypertension Initially held lisinopril HCTZ given acute kidney injury and hypotension on admission.  Blood pressure finally trending upward today, so we will slowly restart home medications  Type II diabetes mellitus with renal manifestations (HCC) Resume sliding scale when indicated, d/c'd due to hypoglycemia and borderline CBG's Hold Farxiga due to borderline glucose, avoid hypoglycemia.  Chronic obstructive pulmonary disease (COPD) (HCC) Stable, not exacerbated. Continue Trelegy equivalent and Combivent.  Over weight Meets criteria with BMI greater than 25  Hypoglycemia Due to AMS/lethargy and NPO status on admission.   --Hypoglycemia protocol --Farxiga and sliding scale coverage on hold  Metabolic acidosis See AKI  GERD without esophagitis Continue PPI.  Depression Continue Cymbalta and Depakote ER.  Pulmonary hypertension (HCC) Continue  Daliresp.        Subjective: No complaints.  Physical Exam: Vitals:   09/06/22 1714 09/07/22 0048 09/07/22 0221 09/07/22 0913  BP: (!) 137/46 96/64 124/69 119/67  Pulse: (!) 59 (!) 51 (!) 59 (!) 57  Resp: 16 16  17  Temp: 97.9 F (36.6 C) 97.9 F (36.6 C)  98 F (36.7 C)  TempSrc:      SpO2: 100% 100%  100%  Weight:      Height:       General exam: Oriented x 3, no acute distress HEENT: Mucous membranes slightly dry Respiratory system: Clear to auscultation bilaterally Cardiovascular system: Regular rate and rhythm, S1-S2 Gastrointestinal system: Soft, nontender, nondistended with positive bowel sounds Central nervous system: No focal deficits Extremities: No clubbing or cyanosis or edema Skin: No skin breaks, tears or lesions Psychiatry: Appropriate, no evidence of psychoses  Consultants: -Nephrology -Hematology  Data   Reviewed: White blood cell count of 2.5, hemoglobin 9.1 Family Communication: Will call sister  Disposition: Status is:  Inpatient Remains inpatient appropriate because:  -Skilled nursing on Monday   Planned Discharge Destination: Skilled nursing facility     Author: Annita Brod, MD 09/07/2022 2:44 PM  For on call review [email or web address].

## 2022-09-07 NOTE — Plan of Care (Signed)

## 2022-09-08 LAB — GLUCOSE, CAPILLARY
Glucose-Capillary: 78 mg/dL (ref 70–99)
Glucose-Capillary: 79 mg/dL (ref 70–99)
Glucose-Capillary: 91 mg/dL (ref 70–99)
Glucose-Capillary: 91 mg/dL (ref 70–99)

## 2022-09-08 LAB — CBC WITH DIFFERENTIAL/PLATELET
Abs Immature Granulocytes: 0.03 10*3/uL (ref 0.00–0.07)
Basophils Absolute: 0 10*3/uL (ref 0.0–0.1)
Basophils Relative: 1 %
Eosinophils Absolute: 0.1 10*3/uL (ref 0.0–0.5)
Eosinophils Relative: 3 %
HCT: 29.8 % — ABNORMAL LOW (ref 39.0–52.0)
Hemoglobin: 9.2 g/dL — ABNORMAL LOW (ref 13.0–17.0)
Immature Granulocytes: 1 %
Lymphocytes Relative: 52 %
Lymphs Abs: 1.4 10*3/uL (ref 0.7–4.0)
MCH: 31.8 pg (ref 26.0–34.0)
MCHC: 30.9 g/dL (ref 30.0–36.0)
MCV: 103.1 fL — ABNORMAL HIGH (ref 80.0–100.0)
Monocytes Absolute: 0.3 10*3/uL (ref 0.1–1.0)
Monocytes Relative: 11 %
Neutro Abs: 0.9 10*3/uL — ABNORMAL LOW (ref 1.7–7.7)
Neutrophils Relative %: 32 %
Platelets: 71 10*3/uL — ABNORMAL LOW (ref 150–400)
RBC: 2.89 MIL/uL — ABNORMAL LOW (ref 4.22–5.81)
RDW: 13.8 % (ref 11.5–15.5)
WBC: 2.8 10*3/uL — ABNORMAL LOW (ref 4.0–10.5)
nRBC: 0 % (ref 0.0–0.2)

## 2022-09-08 LAB — CULTURE, BLOOD (ROUTINE X 2): Culture: NO GROWTH

## 2022-09-08 NOTE — Discharge Summary (Signed)
Physician Discharge Summary   Patient: Albert Figueroa MRN: [id number] DOB: 1956-04-13  Admit date:     09/01/2022  Anticipated discharge date: 09/09/2022  Discharge Physician: Albert Figueroa   PCP: Albert Body, MD   Recommendations at discharge:   Medication clarification: Cymbalta 30 mg daily discontinued.  Patient had not been taking this medicine at home. Medication change: Farxiga 10 mg daily discontinued.  Can be restarted as patient's blood sugars trend upward. Medication clarification: Hydralazine 25 mg p.o. every 8 hours discontinued.  Patient has not been taking this medication at home. Medication change: Demadex 20 mg p.o. daily as needed for edema.  This medication has been discontinued Patient being discharged to skilled nursing Speech we will follow patient at skilled nursing to reevaluate diet  Discharge Diagnoses: Active Problems:   Chronic pain syndrome   Pancytopenia (HCC)   Blepharitis of right lower eyelid   Essential hypertension   Dyslipidemia   Thrombocytopenia (HCC)   Type II diabetes mellitus with renal manifestations (HCC)   Chronic obstructive pulmonary disease (COPD) ([place])   Over weight   Pulmonary hypertension (HCC)   Depression   GERD without esophagitis   Metabolic acidosis   Hypoglycemia  Principal Problem (Resolved):   Acute kidney injury superimposed on stage IIIb chronic kidney disease ([place]) Resolved Problems:   Acute metabolic encephalopathy   Hypotension  Hospital Course: Albert Figueroa is a 66 y.o. Caucasian male with medical history significant for stage IIIb chronic kidney disease, hypertension, dyslipidemia, seizure disorder, osteoarthritis, lumbar stenosis and type 2 diabetes mellitus, who presented on evening of 09/01/2022 for evaluation of worsening confusion and somnolence with hypoxemia of 88% on room air.  He was unable to provide history due to being lethargic.  Wife noted no urine output day of admission.    Found to  have acute kidney injury with creatinine of 7.5.  Nephrology consulted and patient started on aggressive IV fluid resuscitation and over time, patient's renal function and mentation have continued to improve.  By 12/7, renal function back to normal.  Seen by physical therapy who are recommending skilled nursing.  Patient to go on Monday, 12/11.  Assessment and Plan: * Acute kidney injury superimposed on stage IIIb chronic kidney disease (HCC)-resolved as of 96/10/9526 Metabolic acidosis due to renal failure.  Nephrology following.  Renal function has since normalized.   Pancytopenia ([place]) Seen by hematology.  Felt to be secondary dilutional given aggressive fluid resuscitation plus chronic renal insufficiency causing chronic anemia.  Bone marrow biopsy not felt to be needed at this time.  White blood cell count slowly increasing since/8.  Platelet counts have stayed low, possibly due to Lovenox which was discontinued   Chronic pain syndrome Involving shoulder, back, both knees.   --Resume home oxycodone with BP hold parameter, also hold if pt lethargic   Acute metabolic encephalopathy-resolved as of 09/04/2022 Much improved, alert and oriented x 3, appropriate    Blepharitis of right lower eyelid Tobramycin ointment to lower eyelid.   Hypotension-resolved as of 09/06/2022 POA, likely due to hypovolemia.  Family has resolved and blood pressures are trending up, so we will discontinue IV fluids   Dyslipidemia Continue statin and fenofibrate.   Essential hypertension Initially held lisinopril HCTZ given acute kidney injury and hypotension on admission.  Blood pressure medications restarted as blood pressure started to safely trend back upward.    Type II diabetes mellitus with renal manifestations (HCC) Resume sliding scale when indicated, d/c'd due to hypoglycemia and borderline  CBG's Hold Farxiga due to borderline glucose, avoid hypoglycemia.   Chronic obstructive pulmonary disease (COPD)  (HCC) Stable, not exacerbated. Continue Trelegy equivalent and Combivent.   Over weight Meets criteria with BMI greater than 25   Hypoglycemia Due to AMS/lethargy and NPO status on admission.   --Hypoglycemia protocol --Albert Figueroa and sliding scale coverage on hold   Metabolic acidosis See AKI   GERD without esophagitis Continue PPI.   Depression Continue Depakote ER.  Patient sister clarified that he had not been on Cymbalta for some time as it had made him confused   Pulmonary hypertension ([place]) Continue  Daliresp.          Pain control - Albert Figueroa Controlled Substance Reporting System database was reviewed. and patient was instructed, not to drive, operate heavy machinery, perform activities at heights, swimming or participation in water activities or provide baby-sitting services while on Pain, Sleep and Anxiety Medications; until their outpatient Physician has advised to do so again. Also recommended to not to take more than prescribed Pain, Sleep and Anxiety Medications.  Consultants:  -Hematology -Nephrology Procedures performed: None Disposition: Skilled nursing Diet recommendation:  Discharge Diet Orders (From admission, onward)     Start     Ordered   09/07/22 0000  Diet - low sodium heart healthy        09/07/22 1440           Dysphagia 3 diet with heart healthy DISCHARGE MEDICATION: Allergies as of 09/08/2022       Reactions   Gabapentin Other (See Comments)   Unknown reaction (Per Significant Other)   Amlodipine    Other reaction(s): Other (see comments) Caused elevated blood pressure and headaches        Medication List     STOP taking these medications    DULoxetine 30 MG capsule Commonly known as: CYMBALTA   Farxiga 10 MG Tabs tablet Generic drug: dapagliflozin propanediol   hydrALAZINE 25 MG tablet Commonly known as: APRESOLINE   torsemide 20 MG tablet Commonly known as: DEMADEX   Trelegy Ellipta 100-62.5-25 MCG/ACT  Aepb Generic drug: Fluticasone-Umeclidin-Vilant       TAKE these medications    acetaminophen 325 MG tablet Commonly known as: TYLENOL Take 2 tablets (650 mg total) by mouth every 6 (six) hours as needed for mild pain (or Fever >/= 101).   azelastine 0.05 % ophthalmic solution Commonly known as: OPTIVAR Place 1 drop into both eyes 2 (two) times daily.   Combivent Respimat 20-100 MCG/ACT Aers respimat Generic drug: Ipratropium-Albuterol Inhale 1 puff into the lungs 4 (four) times daily.   cyclobenzaprine 10 MG tablet Commonly known as: FLEXERIL Take 10 mg by mouth 3 (three) times daily as needed for muscle spasms.   Daliresp 250 MCG Tabs Generic drug: Roflumilast Take 250 mcg by mouth at bedtime.   Dexilant 60 MG capsule Generic drug: dexlansoprazole Take 60 mg by mouth daily.   divalproex 500 MG 24 hr tablet Commonly known as: DEPAKOTE ER Take 1,000 mg by mouth in the morning and at bedtime.   erythromycin ophthalmic ointment Place 1 Application into both eyes at bedtime.   fenofibrate 160 MG tablet Take 160 mg by mouth daily.   ferrous sulfate 325 (65 FE) MG tablet Take 325 mg by mouth daily with breakfast.   hydrOXYzine 10 MG tablet Commonly known as: ATARAX Take 10 mg by mouth 3 (three) times daily as needed.   lisinopril-hydrochlorothiazide 20-25 MG tablet Commonly known as: ZESTORETIC Take 1 tablet by  mouth daily.   Oxycodone HCl 20 MG Tabs Take 1 tablet (20 mg total) by mouth every 8 (eight) hours as needed.   Ozempic (0.25 or 0.5 MG/DOSE) 2 MG/3ML Sopn Generic drug: Semaglutide(0.25 or 0.5MG/DOS) Inject 2 mg into the skin once a week.   rosuvastatin 20 MG tablet Commonly known as: CRESTOR Take 20 mg by mouth at bedtime.   tiZANidine 4 MG tablet Commonly known as: ZANAFLEX Take 1 tablet (4 mg total) by mouth at bedtime.        Contact information for after-discharge care     Destination     [facility] Preferred SNF  .   Service: Skilled Nursing Contact information: [address] [place] [phone]                    Discharge Exam: Danley Danker Weights   09/01/22 1841 09/01/22 2340  Weight: 64.3 kg 91 kg    Alert and oriented x 3, no acute distress Cardiovascular: Regular rate and rhythm, S1-S2 Lungs: Clear to auscultation bilaterally  Condition at discharge: good  The results of significant diagnostics from this hospitalization (including imaging, microbiology, ancillary and laboratory) are listed below for reference.   Imaging Studies: US RENAL  Result Date: 09/02/2022 CLINICAL DATA:  Acute kidney injury EXAM: RENAL / URINARY TRACT ULTRASOUND COMPLETE COMPARISON:  None Available. FINDINGS: Right Kidney: Renal measurements: 11.1 x 6.3 x 5.4 cm = volume: 197.15 mL. Echogenicity within normal limits. No solid mass or hydronephrosis visualized. 7.2 x 7.7 x 6.8 cm right upper pole renal cyst. 3.8 x 3.3 x 3.8 cm right lower pole renal cyst. Left Kidney: Renal measurements: 11.1 x 6.5 x 6.4 cm = volume: 241.65 mL. Echogenicity within normal limits. No solid mass or hydronephrosis visualized. 3.8 x 3.4 x 3.6 cm left upper pole renal cyst. Bladder: Appears normal for degree of bladder distention. Other: None. IMPRESSION: 1. No obstructive uropathy. 2. Bilateral renal cysts. Electronically Signed   By: Kathreen Devoid M.D.   On: 09/02/2022 10:51   CT Head Wo Contrast  Result Date: 09/01/2022 CLINICAL DATA:  Altered mental status EXAM: CT HEAD WITHOUT CONTRAST TECHNIQUE: Contiguous axial images were obtained from the base of the skull through the vertex without intravenous contrast. RADIATION DOSE REDUCTION: This exam was performed according to the departmental dose-optimization program which includes automated exposure control, adjustment of the mA and/or kV according to patient size and/or use of iterative reconstruction technique. COMPARISON:  Head CT 07/05/2022.  MRI brain  06/15/2021. FINDINGS: Brain: No acute hemorrhage, acute infarct or acute extra-axial fluid collection identified. No midline shift or mass effect. Encephalomalacia in the right frontal lobe is unchanged. Focal left frontal lobe periventricular white matter hypodensity is unchanged. The ventricles are nondilated and stable in size. Right parietal approach ventriculostomy catheter ends near midline in the right lateral ventricle, unchanged from prior. Basal cisterns are patent. Vascular: Atherosclerotic calcifications are present within the cavernous internal carotid arteries. Skull: Normal. Negative for fracture or focal lesion. Sinuses/Orbits: No acute finding. Other: None. IMPRESSION: 1. No acute intracranial process. 2. Unchanged position of right parietal approach ventriculostomy catheter. No evidence of hydrocephalus. Electronically Signed   By: Ronney Asters M.D.   On: 09/01/2022 22:06   DG Chest Port 1 View  Result Date: 09/01/2022 CLINICAL DATA:  Decreased oxygen saturation. EXAM: PORTABLE CHEST 1 VIEW COMPARISON:  June 15, 2021 FINDINGS: The heart size and mediastinal contours are within normal limits. Intact ventriculoperitoneal shunt tubing  is seen on the right. Low lung volumes are noted with mild areas of atelectasis and/or infiltrate seen within the bilateral lung bases. There is no evidence of a pleural effusion or pneumothorax. Multilevel degenerative changes are seen throughout the thoracic spine. IMPRESSION: Low lung volumes with mild bibasilar atelectasis and/or infiltrate. Electronically Signed   By: Virgina Norfolk M.D.   On: 09/01/2022 19:08    Microbiology: Results for orders placed or performed during the hospital encounter of 09/01/22  Blood Culture (routine x 2)     Status: None   Collection Time: 09/01/22  6:41 PM   Specimen: BLOOD  Result Value Ref Range Status   Specimen Description BLOOD BLOOD LEFT ARM  Final   Special Requests   Final    BOTTLES DRAWN AEROBIC AND  ANAEROBIC Blood Culture adequate volume   Culture   Final    NO GROWTH 5 DAYS Performed at [facility], [address], [place], [place] [address]    Report Status 09/06/2022 FINAL  Final  SARS Coronavirus 2 by RT PCR (hospital order, performed in [facility] hospital lab) *cepheid single result test* Anterior Nasal Swab     Status: None   Collection Time: 09/01/22  6:42 PM   Specimen: Anterior Nasal Swab  Result Value Ref Range Status   SARS Coronavirus 2 by RT PCR NEGATIVE NEGATIVE Final    Comment: (NOTE) SARS-CoV-2 target nucleic acids are NOT DETECTED.  The SARS-CoV-2 RNA is generally detectable in upper and lower respiratory specimens during the acute phase of infection. The lowest concentration of SARS-CoV-2 viral copies this assay can detect is 250 copies / mL. A negative result does not preclude SARS-CoV-2 infection and should not be used as the sole basis for treatment or other patient management decisions.  A negative result may occur with improper specimen collection / handling, submission of specimen other than nasopharyngeal swab, presence of viral mutation(s) within the areas targeted by this assay, and inadequate number of viral copies (<250 copies / mL). A negative result must be combined with clinical observations, patient history, and epidemiological information.  Fact Sheet for Patients:   https://www.patel.info/  Fact Sheet for Healthcare Providers: https://hall.com/  This test is not yet approved or  cleared by the [place] FDA and has been authorized for detection and/or diagnosis of SARS-CoV-2 by FDA under an Emergency Use Authorization (EUA).  This EUA will remain in effect (meaning this test can be used) for the duration of the COVID-19 declaration under Section 564(b)(1) of the Act, 21 U.S.C. section 360bbb-3(b)(1), unless the authorization is terminated or revoked sooner.  Performed at  [facility], [address] [place], [place] [address]   Culture, blood (Routine X 2) w Reflex to ID Panel     Status: None   Collection Time: 09/03/22  2:20 AM   Specimen: BLOOD  Result Value Ref Range Status   Specimen Description BLOOD BLOOD RIGHT ARM  Final   Special Requests   Final    IN BOTH AEROBIC AND ANAEROBIC BOTTLES Blood Culture adequate volume   Culture   Final    NO GROWTH 5 DAYS Performed at [facility], [address], [place], [place] [address]    Report Status 09/08/2022 FINAL  Final    Labs: CBC: Recent Labs  Lab 09/01/22 1841 09/02/22 0506 09/04/22 0619 09/05/22 0600 09/06/22 0628 09/07/22 0457 09/08/22 0428  WBC 5.2   < > 2.9* 2.7*  2.7* 2.3* 2.5* 2.8*  NEUTROABS 3.0  --   --  1.2*  --  0.7* 0.9*  HGB 8.1*   < > 9.3* 8.4*  8.3* 8.8* 9.1* 9.2*  HCT 27.1*   < > 30.0* 27.4*  27.1* 27.9* 29.5* 29.8*  MCV 107.5*   < > 103.8* 104.2*  103.4* 101.8* 102.4* 103.1*  PLT 115*   < > 89* 84*  86* 78* 72* 71*   < > = values in this interval not displayed.   Basic Metabolic Panel: Recent Labs  Lab 09/02/22 0506 09/03/22 0220 09/04/22 0619 09/05/22 0600 09/06/22 0628  NA 143 145 148* 147* 145  K 4.1 4.3 4.5 4.3 4.2  CL 119* 120* 126* 124* 122*  CO2 17* 19* 20* 21* 21*  GLUCOSE 70 88 79 84 84  BUN 77* 56* 43* 38* 28*  CREATININE 5.53* 3.52* 2.18* 1.76* 1.55*  CALCIUM 8.1* 8.7* 8.7* 8.6* 8.5*  MG  --  2.5*  --   --   --    Liver Function Tests: Recent Labs  Lab 09/01/22 1841  AST 65*  ALT 23  ALKPHOS 61  BILITOT 1.1  PROT 6.6  ALBUMIN 2.7*   CBG: Recent Labs  Lab 09/07/22 1209 09/07/22 1700 09/07/22 2116 09/08/22 0750 09/08/22 1123  GLUCAP 74 97 111* 78 79    Discharge time spent: less than 30 minutes.  Signed: Annita Brod, MD [facility] 09/08/2022

## 2022-09-09 ENCOUNTER — Other Ambulatory Visit: Payer: Self-pay | Admitting: Internal Medicine

## 2022-09-09 LAB — PROTEIN ELECTROPHORESIS, SERUM
A/G Ratio: 0.7 (ref 0.7–1.7)
Albumin ELP: 2.3 g/dL — ABNORMAL LOW (ref 2.9–4.4)
Alpha-1-Globulin: 0.3 g/dL (ref 0.0–0.4)
Alpha-2-Globulin: 0.8 g/dL (ref 0.4–1.0)
Beta Globulin: 0.8 g/dL (ref 0.7–1.3)
Gamma Globulin: 1.4 g/dL (ref 0.4–1.8)
Globulin, Total: 3.2 g/dL (ref 2.2–3.9)
M-Spike, %: 0.3 g/dL — ABNORMAL HIGH
Total Protein ELP: 5.5 g/dL — ABNORMAL LOW (ref 6.0–8.5)

## 2022-09-09 LAB — CBC WITH DIFFERENTIAL/PLATELET
Abs Immature Granulocytes: 0.04 10*3/uL (ref 0.00–0.07)
Basophils Absolute: 0 10*3/uL (ref 0.0–0.1)
Basophils Relative: 1 %
Eosinophils Absolute: 0.1 10*3/uL (ref 0.0–0.5)
Eosinophils Relative: 2 %
HCT: 27.7 % — ABNORMAL LOW (ref 39.0–52.0)
Hemoglobin: 8.7 g/dL — ABNORMAL LOW (ref 13.0–17.0)
Immature Granulocytes: 1 %
Lymphocytes Relative: 49 %
Lymphs Abs: 1.5 10*3/uL (ref 0.7–4.0)
MCH: 31.6 pg (ref 26.0–34.0)
MCHC: 31.4 g/dL (ref 30.0–36.0)
MCV: 100.7 fL — ABNORMAL HIGH (ref 80.0–100.0)
Monocytes Absolute: 0.4 10*3/uL (ref 0.1–1.0)
Monocytes Relative: 12 %
Neutro Abs: 1.1 10*3/uL — ABNORMAL LOW (ref 1.7–7.7)
Neutrophils Relative %: 35 %
Platelets: 75 10*3/uL — ABNORMAL LOW (ref 150–400)
RBC: 2.75 MIL/uL — ABNORMAL LOW (ref 4.22–5.81)
RDW: 13.8 % (ref 11.5–15.5)
WBC: 3 10*3/uL — ABNORMAL LOW (ref 4.0–10.5)
nRBC: 0 % (ref 0.0–0.2)

## 2022-09-09 LAB — GLUCOSE, CAPILLARY: Glucose-Capillary: 77 mg/dL (ref 70–99)

## 2022-09-09 MED ORDER — OXYCODONE HCL 20 MG PO TABS: 1.0000 | ORAL_TABLET | Freq: Three times a day (TID) | ORAL | 0 refills | Status: DC | PRN

## 2022-09-09 NOTE — TOC Progression Note (Signed)
Transition of Care [facility]) - Progression Note    Patient Details  Name: Albert Figueroa MRN: [id number] Date of Birth: [birth year]  Transition of Care [facility]) CM/SW [place], RN Phone Number: 09/09/2022, 10:07 AM  Clinical Narrative:     The patient  is going to [facility] room 808, EMS called,    Expected Discharge Plan: [place] Barriers to Discharge: No SNF bed ([facility] will accept Monday 09/09/22. Ins authorizatio approved.)  Expected Discharge Plan and Services Expected Discharge Plan: [place]   Discharge Planning Services: CM Consult   Living arrangements for the past 2 months: [name], Apartment Expected Discharge Date: 09/09/22               DME Arranged: N/A DME Agency: NA                   Social Determinants of Health (SDOH) Interventions    Readmission Risk Interventions     No data to display

## 2022-09-09 NOTE — Plan of Care (Signed)
  Problem: Safety: Goal: Ability to remain free from injury will improve Outcome: Progressing   Problem: Elimination: Goal: Will not experience complications related to urinary retention Outcome: Progressing   Problem: Pain Managment: Goal: General experience of comfort will improve Outcome: Progressing

## 2022-09-09 NOTE — Plan of Care (Signed)

## 2022-09-09 NOTE — Care Management Important Message (Signed)
Important Message  Patient Details  Name: Albert Figueroa MRN: [id number] Date of Birth: March 28, 1956   Medicare Important Message Given:  Yes     [name] 09/09/2022, 9:44 AM

## 2022-09-09 NOTE — TOC Progression Note (Signed)
Transition of Care [facility]) - Progression Note    Patient Details  Name: CAROLL CUNNINGTON MRN: [id number] Date of Birth: June 18, 1956  Transition of Care [facility]) CM/SW [place], RN Phone Number: 09/09/2022, 8:58 AM  Clinical Narrative:    Attempted to reach the patient's sister Marily Lente is broke down on her way to work, Left my number for a return call   Expected Discharge Plan: [place] Barriers to Discharge: No SNF bed ([facility] will accept Monday 09/09/22. Ins authorizatio approved.)  Expected Discharge Plan and Services Expected Discharge Plan: [place]   Discharge Planning Services: CM Consult   Living arrangements for the past 2 months: [place], Apartment Expected Discharge Date: 09/09/22               DME Arranged: N/A DME Agency: NA                   Social Determinants of Health (SDOH) Interventions    Readmission Risk Interventions     No data to display

## 2022-09-09 NOTE — Progress Notes (Signed)
DISCHARGE NOTE:  Report given to [place], LPN @ [facility], EMS here to transport pt.

## 2022-09-09 NOTE — Progress Notes (Unsigned)
{  Select_TRH_Note:[id number]} 

## 2022-09-11 LAB — ERYTHROPOIETIN: Erythropoietin: 36.8 m[IU]/mL — ABNORMAL HIGH (ref 2.6–18.5)

## 2022-09-13 ENCOUNTER — Inpatient Hospital Stay: Payer: Medicare Other

## 2022-09-13 ENCOUNTER — Inpatient Hospital Stay: Payer: Medicare Other | Attending: Oncology | Admitting: Oncology

## 2022-12-24 ENCOUNTER — Other Ambulatory Visit: Payer: Self-pay | Admitting: Anesthesiology

## 2022-12-24 DIAGNOSIS — M5030 Other cervical disc degeneration, unspecified cervical region: Secondary | ICD-10-CM

## 2023-03-04 ENCOUNTER — Inpatient Hospital Stay
Admission: EM | Admit: 2023-03-04 | Discharge: 2023-03-11 | DRG: 682 | Disposition: A | Discharge status: Skilled Nursing Facility | Payer: 59 | Attending: Internal Medicine | Admitting: Internal Medicine

## 2023-03-04 ENCOUNTER — Encounter: Payer: Self-pay | Admitting: Internal Medicine

## 2023-03-04 ENCOUNTER — Emergency Department: Payer: 59

## 2023-03-04 ENCOUNTER — Other Ambulatory Visit: Payer: Self-pay

## 2023-03-04 DIAGNOSIS — N184 Chronic kidney disease, stage 4 (severe): Secondary | ICD-10-CM | POA: Diagnosis present

## 2023-03-04 DIAGNOSIS — E870 Hyperosmolality and hypernatremia: Secondary | ICD-10-CM | POA: Diagnosis present

## 2023-03-04 DIAGNOSIS — G928 Other toxic encephalopathy: Secondary | ICD-10-CM | POA: Diagnosis present

## 2023-03-04 DIAGNOSIS — F32A Depression, unspecified: Secondary | ICD-10-CM | POA: Diagnosis present

## 2023-03-04 DIAGNOSIS — F17210 Nicotine dependence, cigarettes, uncomplicated: Secondary | ICD-10-CM | POA: Diagnosis present

## 2023-03-04 DIAGNOSIS — Z8661 Personal history of infections of the central nervous system: Secondary | ICD-10-CM

## 2023-03-04 DIAGNOSIS — D61818 Other pancytopenia: Secondary | ICD-10-CM | POA: Diagnosis present

## 2023-03-04 DIAGNOSIS — Z79899 Other long term (current) drug therapy: Secondary | ICD-10-CM

## 2023-03-04 DIAGNOSIS — F0394 Unspecified dementia, unspecified severity, with anxiety: Secondary | ICD-10-CM | POA: Diagnosis present

## 2023-03-04 DIAGNOSIS — I129 Hypertensive chronic kidney disease with stage 1 through stage 4 chronic kidney disease, or unspecified chronic kidney disease: Secondary | ICD-10-CM | POA: Diagnosis present

## 2023-03-04 DIAGNOSIS — M79605 Pain in left leg: Secondary | ICD-10-CM | POA: Diagnosis present

## 2023-03-04 DIAGNOSIS — E872 Acidosis, unspecified: Secondary | ICD-10-CM | POA: Diagnosis present

## 2023-03-04 DIAGNOSIS — F0393 Unspecified dementia, unspecified severity, with mood disturbance: Secondary | ICD-10-CM | POA: Diagnosis present

## 2023-03-04 DIAGNOSIS — R569 Unspecified convulsions: Secondary | ICD-10-CM

## 2023-03-04 DIAGNOSIS — J439 Emphysema, unspecified: Secondary | ICD-10-CM | POA: Diagnosis present

## 2023-03-04 DIAGNOSIS — Z888 Allergy status to other drugs, medicaments and biological substances status: Secondary | ICD-10-CM

## 2023-03-04 DIAGNOSIS — E1122 Type 2 diabetes mellitus with diabetic chronic kidney disease: Secondary | ICD-10-CM | POA: Diagnosis present

## 2023-03-04 DIAGNOSIS — Z982 Presence of cerebrospinal fluid drainage device: Secondary | ICD-10-CM

## 2023-03-04 DIAGNOSIS — F418 Other specified anxiety disorders: Secondary | ICD-10-CM | POA: Diagnosis present

## 2023-03-04 DIAGNOSIS — G894 Chronic pain syndrome: Secondary | ICD-10-CM | POA: Diagnosis present

## 2023-03-04 DIAGNOSIS — Z79891 Long term (current) use of opiate analgesic: Secondary | ICD-10-CM

## 2023-03-04 DIAGNOSIS — T402X5A Adverse effect of other opioids, initial encounter: Secondary | ICD-10-CM | POA: Diagnosis present

## 2023-03-04 DIAGNOSIS — I10 Essential (primary) hypertension: Secondary | ICD-10-CM | POA: Diagnosis not present

## 2023-03-04 DIAGNOSIS — Z66 Do not resuscitate: Secondary | ICD-10-CM | POA: Diagnosis present

## 2023-03-04 DIAGNOSIS — E860 Dehydration: Secondary | ICD-10-CM | POA: Diagnosis not present

## 2023-03-04 DIAGNOSIS — E1129 Type 2 diabetes mellitus with other diabetic kidney complication: Secondary | ICD-10-CM | POA: Diagnosis present

## 2023-03-04 DIAGNOSIS — F05 Delirium due to known physiological condition: Secondary | ICD-10-CM | POA: Diagnosis not present

## 2023-03-04 DIAGNOSIS — N179 Acute kidney failure, unspecified: Secondary | ICD-10-CM | POA: Diagnosis not present

## 2023-03-04 DIAGNOSIS — Z7985 Long-term (current) use of injectable non-insulin antidiabetic drugs: Secondary | ICD-10-CM

## 2023-03-04 DIAGNOSIS — E785 Hyperlipidemia, unspecified: Secondary | ICD-10-CM | POA: Diagnosis present

## 2023-03-04 DIAGNOSIS — Z833 Family history of diabetes mellitus: Secondary | ICD-10-CM

## 2023-03-04 DIAGNOSIS — D509 Iron deficiency anemia, unspecified: Secondary | ICD-10-CM | POA: Diagnosis present

## 2023-03-04 DIAGNOSIS — G9341 Metabolic encephalopathy: Secondary | ICD-10-CM | POA: Diagnosis not present

## 2023-03-04 DIAGNOSIS — Y92009 Unspecified place in unspecified non-institutional (private) residence as the place of occurrence of the external cause: Secondary | ICD-10-CM

## 2023-03-04 DIAGNOSIS — D696 Thrombocytopenia, unspecified: Secondary | ICD-10-CM | POA: Diagnosis present

## 2023-03-04 DIAGNOSIS — N189 Chronic kidney disease, unspecified: Secondary | ICD-10-CM | POA: Diagnosis present

## 2023-03-04 DIAGNOSIS — Z7984 Long term (current) use of oral hypoglycemic drugs: Secondary | ICD-10-CM

## 2023-03-04 DIAGNOSIS — J430 Unilateral pulmonary emphysema [MacLeod's syndrome]: Secondary | ICD-10-CM

## 2023-03-04 DIAGNOSIS — J449 Chronic obstructive pulmonary disease, unspecified: Secondary | ICD-10-CM | POA: Diagnosis present

## 2023-03-04 DIAGNOSIS — M79604 Pain in right leg: Secondary | ICD-10-CM | POA: Diagnosis present

## 2023-03-04 DIAGNOSIS — Z720 Tobacco use: Secondary | ICD-10-CM | POA: Diagnosis present

## 2023-03-04 LAB — CBC
HCT: 33.6 % — ABNORMAL LOW (ref 39.0–52.0)
Hemoglobin: 10.4 g/dL — ABNORMAL LOW (ref 13.0–17.0)
MCH: 32 pg (ref 26.0–34.0)
MCHC: 31 g/dL (ref 30.0–36.0)
MCV: 103.4 fL — ABNORMAL HIGH (ref 80.0–100.0)
Platelets: 99 10*3/uL — ABNORMAL LOW (ref 150–400)
RBC: 3.25 MIL/uL — ABNORMAL LOW (ref 4.22–5.81)
RDW: 14.3 % (ref 11.5–15.5)
WBC: 5 10*3/uL (ref 4.0–10.5)
nRBC: 0 % (ref 0.0–0.2)

## 2023-03-04 LAB — URINALYSIS, W/ REFLEX TO CULTURE (INFECTION SUSPECTED)
Bacteria, UA: NONE SEEN
Bilirubin Urine: NEGATIVE
Glucose, UA: 150 mg/dL — AB
Ketones, ur: NEGATIVE mg/dL
Leukocytes,Ua: NEGATIVE
Nitrite: NEGATIVE
Protein, ur: 30 mg/dL — AB
Specific Gravity, Urine: 1.015 (ref 1.005–1.030)
pH: 5 (ref 5.0–8.0)

## 2023-03-04 LAB — URINE DRUG SCREEN, QUALITATIVE (ARMC ONLY)
Amphetamines, Ur Screen: NOT DETECTED
Barbiturates, Ur Screen: NOT DETECTED
Benzodiazepine, Ur Scrn: NOT DETECTED
Cannabinoid 50 Ng, Ur ~~LOC~~: NOT DETECTED
Cocaine Metabolite,Ur ~~LOC~~: NOT DETECTED
MDMA (Ecstasy)Ur Screen: NOT DETECTED
Methadone Scn, Ur: NOT DETECTED
Opiate, Ur Screen: NOT DETECTED
Phencyclidine (PCP) Ur S: NOT DETECTED
Tricyclic, Ur Screen: NOT DETECTED

## 2023-03-04 LAB — COMPREHENSIVE METABOLIC PANEL
ALT: 14 U/L (ref 0–44)
AST: 38 U/L (ref 15–41)
Albumin: 3.3 g/dL — ABNORMAL LOW (ref 3.5–5.0)
Alkaline Phosphatase: 69 U/L (ref 38–126)
Anion gap: 14 (ref 5–15)
BUN: 110 mg/dL — ABNORMAL HIGH (ref 8–23)
CO2: 12 mmol/L — ABNORMAL LOW (ref 22–32)
Calcium: 8.7 mg/dL — ABNORMAL LOW (ref 8.9–10.3)
Chloride: 118 mmol/L — ABNORMAL HIGH (ref 98–111)
Creatinine, Ser: 3.61 mg/dL — ABNORMAL HIGH (ref 0.61–1.24)
GFR, Estimated: 18 mL/min — ABNORMAL LOW (ref >60)
Glucose, Bld: 109 mg/dL — ABNORMAL HIGH (ref 70–99)
Potassium: 4.2 mmol/L (ref 3.5–5.1)
Sodium: 144 mmol/L (ref 135–145)
Total Bilirubin: 1.5 mg/dL — ABNORMAL HIGH (ref 0.3–1.2)
Total Protein: 7.3 g/dL (ref 6.5–8.1)

## 2023-03-04 LAB — GLUCOSE, CAPILLARY
Glucose-Capillary: 84 mg/dL (ref 70–99)
Glucose-Capillary: 94 mg/dL (ref 70–99)

## 2023-03-04 LAB — CBG MONITORING, ED: Glucose-Capillary: 97 mg/dL (ref 70–99)

## 2023-03-04 MED ORDER — ROSUVASTATIN CALCIUM 10 MG PO TABS
20.0000 mg | ORAL_TABLET | Freq: Every day | ORAL | Status: DC
  Administered 2023-03-04 – 2023-03-10 (×7): 20 mg via ORAL
  Filled 2023-03-04 (×7): qty 2

## 2023-03-04 MED ORDER — HYDRALAZINE HCL 20 MG/ML IJ SOLN: 5.0000 mg | INTRAMUSCULAR | Status: DC | PRN

## 2023-03-04 MED ORDER — INSULIN ASPART 100 UNIT/ML IJ SOLN
0.0000 [IU] | Freq: Three times a day (TID) | INTRAMUSCULAR | Status: DC
  Filled 2023-03-04: qty 1

## 2023-03-04 MED ORDER — ALBUTEROL SULFATE (2.5 MG/3ML) 0.083% IN NEBU: 3.0000 mL | INHALATION_SOLUTION | RESPIRATORY_TRACT | Status: DC | PRN

## 2023-03-04 MED ORDER — DM-GUAIFENESIN ER 30-600 MG PO TB12: 1.0000 | ORAL_TABLET | Freq: Two times a day (BID) | ORAL | Status: DC | PRN

## 2023-03-04 MED ORDER — ACETAMINOPHEN 325 MG PO TABS
650.0000 mg | ORAL_TABLET | Freq: Four times a day (QID) | ORAL | Status: DC | PRN
  Administered 2023-03-10: 650 mg via ORAL
  Filled 2023-03-04 (×2): qty 2

## 2023-03-04 MED ORDER — ONDANSETRON HCL 4 MG/2ML IJ SOLN: 4.0000 mg | Freq: Three times a day (TID) | INTRAMUSCULAR | Status: DC | PRN

## 2023-03-04 MED ORDER — SODIUM CHLORIDE 0.9 % IV SOLN: Freq: Once | INTRAVENOUS | Status: AC

## 2023-03-04 MED ORDER — FENOFIBRATE 160 MG PO TABS
160.0000 mg | ORAL_TABLET | Freq: Every day | ORAL | Status: DC
  Administered 2023-03-05 – 2023-03-11 (×7): 160 mg via ORAL
  Filled 2023-03-04 (×7): qty 1

## 2023-03-04 MED ORDER — HALOPERIDOL LACTATE 5 MG/ML IJ SOLN
3.0000 mg | Freq: Once | INTRAMUSCULAR | Status: AC
  Administered 2023-03-04: 3 mg via INTRAVENOUS
  Filled 2023-03-04: qty 1

## 2023-03-04 MED ORDER — LORAZEPAM 2 MG/ML IJ SOLN: 2.0000 mg | INTRAMUSCULAR | Status: DC | PRN

## 2023-03-04 MED ORDER — NICOTINE 21 MG/24HR TD PT24
21.0000 mg | MEDICATED_PATCH | Freq: Every day | TRANSDERMAL | Status: DC
  Administered 2023-03-04 – 2023-03-11 (×8): 21 mg via TRANSDERMAL
  Filled 2023-03-04 (×8): qty 1

## 2023-03-04 MED ORDER — DIVALPROEX SODIUM ER 500 MG PO TB24
1000.0000 mg | ORAL_TABLET | Freq: Two times a day (BID) | ORAL | Status: DC
  Administered 2023-03-04 – 2023-03-11 (×14): 1000 mg via ORAL
  Filled 2023-03-04 (×17): qty 2

## 2023-03-04 MED ORDER — OXYCODONE HCL 5 MG PO TABS
20.0000 mg | ORAL_TABLET | Freq: Three times a day (TID) | ORAL | Status: DC | PRN
  Administered 2023-03-04 – 2023-03-10 (×14): 20 mg via ORAL
  Filled 2023-03-04 (×14): qty 4

## 2023-03-04 MED ORDER — INSULIN ASPART 100 UNIT/ML IJ SOLN: 0.0000 [IU] | Freq: Every day | INTRAMUSCULAR | Status: DC

## 2023-03-04 MED ORDER — SODIUM CHLORIDE 0.9 % IV SOLN: INTRAVENOUS | Status: DC

## 2023-03-04 MED ORDER — PANTOPRAZOLE SODIUM 40 MG PO TBEC
40.0000 mg | DELAYED_RELEASE_TABLET | Freq: Every day | ORAL | Status: DC
  Administered 2023-03-05 – 2023-03-11 (×7): 40 mg via ORAL
  Filled 2023-03-04 (×8): qty 1

## 2023-03-04 NOTE — ED Triage Notes (Signed)
Pt from home via EMS. Sister states LKW was at 8p. Sister and landowner came to check in on pt around 12p when he seemed more confused than normal - does have recent diagnosis of mild dementia. EMS stroke negative, able to answer orientation questions but did think they were at a hotel instead of his home.  EMS vitals: BP 103/67 HR 90 CBG 91 Temp 98.2 F [place] pupils, EMS reported he has oxycodone prescription and pt reports last dose was last night and he is compliant with medications

## 2023-03-04 NOTE — H&P (Signed)
History and Physical    Albert Figueroa:[id number] DOB: 1956-04-26 DOA: 03/04/2023  Referring MD/NP/PA:   PCP: Marisue Ivan, MD   Patient coming from:  The patient is coming from home.     Chief Complaint: AMS  HPI: Albert Figueroa is a 66 y.o. 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  Per her daughter (I called her daughter by phone), patient was recently diagnosed with a possible Parkinson's disease by his neurologist.  He has started on gabapentin, but patient could not tolerate gabapentin after taking it for 2 days.  Patient has dementia and is more confused recently.  Initially when I saw patient, he was mildly confused,  and oriented x 3.  He has history of chronic pain syndrome.  He states that he has chronic pain in both legs, bilateral hips.  He denies chest pain or abdominal pain.  No cough, shortness breath.  No nausea, vomiting, diarrhea.  No fever or chills.  Denies symptoms of UTI. Per her daughter, patient drinks little very little water, most of the time he just drinks some Pepsi.  Data reviewed independently and ED Course: pt was found to have WBC 5.5, AKI with creatinine 3.61, BUN 110, GFR 18 (recent baseline creatinine 1.9 on 01/27/2023), temperature normal, blood pressure 113/65, heart rate 88, RR 13, oxygen saturation 100% on room air.  Patient is placed on MedSurg bed for position.  CT of head: 1. Stable size and configuration of the shunted ventricles with unchanged right posterior approach ventricular shunt catheter with tip terminating in the anterior body of the right lateral ventricle. 2. Small bilateral subdural collections, measuring up to 5 mm in thickness on the left and 6 mm in thickness on the right, suggestive of overshunting.   EKG: I  have personally reviewed.  Sinus rhythm, QTc 430, low voltage, Q-wave in lead III   Review of Systems:   General: no fevers, chills, no body weight gain, fatigue HEENT: no blurry vision, hearing changes or sore throat Respiratory: no dyspnea, coughing, wheezing CV: no chest pain, no palpitations GI: no nausea, vomiting, abdominal pain, diarrhea, constipation GU: no dysuria, burning on urination, increased urinary frequency, hematuria  Ext: no leg edema Neuro: no unilateral weakness, numbness, or tingling, no vision change or hearing loss. Has confusion Skin: no rash, no skin tear. MSK: No muscle spasm, no deformity, no limitation of range of movement in spin. Has pain legs and hips. Heme: No easy bruising.  Travel history: No recent long distant travel.   Allergy:  Allergies  Allergen Reactions   Gabapentin Other (See Comments)    Unknown reaction (Per Significant Other)   Amlodipine     Other reaction(s): Other (see comments) Caused elevated blood pressure and headaches    Past Medical History:  Diagnosis Date   Arthritis    Diabetes mellitus without complication (HCC)    Emphysema/COPD (HCC)    Hyperlipidemia    Hypertension    Kidney disease    stage 3   Lumbar stenosis    Osteoarthrosis    Seizure (HCC)     Past Surgical History:  Procedure Laterality Date   KNEE SURGERY     multiple surgeries on both knees    Social History:  reports that he has been smoking cigarettes. He has a 96.00 pack-year smoking history.  He has never used smokeless tobacco. He reports that he does not drink alcohol and does not use drugs.  Family History:  Family History  Problem Relation Age of Onset   Diabetes Mother    Cancer Mother      Prior to Admission medications   Medication Sig Start Date End Date Taking? Authorizing Provider  acetaminophen (TYLENOL) 325 MG tablet Take 2 tablets (650 mg total) by mouth every 6 (six) hours as needed for mild pain (or Fever >/= 101).  09/07/22   Hollice Espy, MD  azelastine (OPTIVAR) 0.05 % ophthalmic solution Place 1 drop into both eyes 2 (two) times daily. 06/26/22   [provider]  COMBIVENT RESPIMAT 20-100 MCG/ACT AERS respimat Inhale 1 puff into the lungs 4 (four) times daily. 05/06/22   [provider]  cyclobenzaprine (FLEXERIL) 10 MG tablet Take 10 mg by mouth 3 (three) times daily as needed for muscle spasms.    [provider]  dexlansoprazole (DEXILANT) 60 MG capsule Take 60 mg by mouth daily.    [provider]  divalproex (DEPAKOTE ER) 500 MG 24 hr tablet Take 1,000 mg by mouth in the morning and at bedtime.    [provider]  erythromycin ophthalmic ointment Place 1 Application into both eyes at bedtime. 08/26/22   [provider]  fenofibrate 160 MG tablet Take 160 mg by mouth daily. 04/25/22   [provider]  ferrous sulfate 325 (65 FE) MG tablet Take 325 mg by mouth daily with breakfast.    [provider]  hydrOXYzine (ATARAX) 10 MG tablet Take 10 mg by mouth 3 (three) times daily as needed. 08/28/22   [provider]  lisinopril-hydrochlorothiazide (ZESTORETIC) 20-25 MG tablet Take 1 tablet by mouth daily. 08/24/22   [provider]  Oxycodone HCl 20 MG TABS Take 1 tablet (20 mg total) by mouth every 8 (eight) hours as needed. 09/09/22   Hollice Espy, MD  OZEMPIC, 0.25 OR 0.5 MG/DOSE, 2 MG/3ML SOPN Inject 2 mg into the skin once a week. Patient not taking: Reported on 09/01/2022 05/06/22   [provider]  Roflumilast (DALIRESP) 250 MCG TABS Take 250 mcg by mouth at bedtime.    [provider]  rosuvastatin (CRESTOR) 20 MG tablet Take 20 mg by mouth at bedtime. 04/08/22   [provider]  tiZANidine (ZANAFLEX) 4 MG tablet Take 1 tablet (4 mg total) by mouth at bedtime. 07/10/22   Lurene Shadow, MD    Physical Exam: Vitals:   03/04/23 1319 03/04/23 1320 03/04/23 1520 03/04/23 1615  BP:   101/61 122/70 113/65  Pulse:  86 88 88  Resp:  16 18 15   Temp: 98.8 F (37.1 C)   97.7 F (36.5 C)  TempSrc: Oral   Oral  SpO2:  100% 100% 100%  Weight:    90.9 kg  Height:    5\' 11"  (1.803 m)   General: Not in acute distress.  Dry mucous membranes HEENT:       Eyes: PERRL, EOMI, no jaundice       ENT: No discharge from the ears and nose, no pharynx injection, no tonsillar enlargement.        Neck: No JVD, no bruit, no mass felt. Heme: No neck lymph node enlargement. Cardiac: S1/S2, RRR, No murmurs, No gallops or rubs. Respiratory: No rales, wheezing, rhonchi or rubs. GI: Soft, nondistended, nontender, no rebound pain, no organomegaly, BS present. GU: No hematuria Ext: No pitting leg edema  bilaterally. 1+DP/PT pulse bilaterally. Musculoskeletal: No joint deformities, No joint redness or warmth, no limitation of ROM in spin.  Has pain legs and hips Skin: No rashes.  Neuro: Patient initially was mildly confused, but still oriented x 3, but later on he is more agitated, more confused, not following commands. Cranial nerves II-XII grossly intact, moves all extremities normally.  Psych: Patient is not psychotic, no suicidal or hemocidal ideation.  Labs on Admission: I have personally reviewed following labs and imaging studies  CBC: Recent Labs  Lab 03/04/23 1318  WBC 5.0  HGB 10.4*  HCT 33.6*  MCV 103.4*  PLT 99*   Basic Metabolic Panel: Recent Labs  Lab 03/04/23 1318  NA 144  K 4.2  CL 118*  CO2 12*  GLUCOSE 109*  BUN 110*  CREATININE 3.61*  CALCIUM 8.7*   GFR: Estimated Creatinine Clearance: 23.2 mL/min (A) (by C-G formula based on SCr of 3.61 mg/dL (H)). Liver Function Tests: Recent Labs  Lab 03/04/23 1318  AST 38  ALT 14  ALKPHOS 69  BILITOT 1.5*  PROT 7.3  ALBUMIN 3.3*   No results for input(s): "LIPASE", "AMYLASE" in the last 168 hours. No results for input(s): "AMMONIA" in the last 168 hours. Coagulation Profile: No results for input(s): "INR",  "PROTIME" in the last 168 hours. Cardiac Enzymes: No results for input(s): "CKTOTAL", "CKMB", "CKMBINDEX", "TROPONINI" in the last 168 hours. BNP (last 3 results) No results for input(s): "PROBNP" in the last 8760 hours. HbA1C: No results for input(s): "HGBA1C" in the last 72 hours. CBG: Recent Labs  Lab 03/04/23 1321 03/04/23 1649  GLUCAP 97 84   Lipid Profile: No results for input(s): "CHOL", "HDL", "LDLCALC", "TRIG", "CHOLHDL", "LDLDIRECT" in the last 72 hours. Thyroid Function Tests: No results for input(s): "TSH", "T4TOTAL", "FREET4", "T3FREE", "THYROIDAB" in the last 72 hours. Anemia Panel: No results for input(s): "VITAMINB12", "FOLATE", "FERRITIN", "TIBC", "IRON", "RETICCTPCT" in the last 72 hours. Urine analysis:    Component Value Date/Time   COLORURINE AMBER (A) 09/01/2022 1920   APPEARANCEUR CLEAR (A) 09/01/2022 1920   LABSPEC 1.021 09/01/2022 1920   PHURINE 5.0 09/01/2022 1920   GLUCOSEU NEGATIVE 09/01/2022 1920   HGBUR SMALL (A) 09/01/2022 1920   BILIRUBINUR NEGATIVE 09/01/2022 1920   KETONESUR NEGATIVE 09/01/2022 1920   PROTEINUR 30 (A) 09/01/2022 1920   NITRITE NEGATIVE 09/01/2022 1920   LEUKOCYTESUR NEGATIVE 09/01/2022 1920   Sepsis Labs: @LABRCNTIP (procalcitonin:4,lacticidven:4) )No results found for this or any previous visit (from the past 240 hour(s)).   Radiological Exams on Admission: CT Head Wo Contrast  Result Date: 03/04/2023 CLINICAL DATA:  Mental status change, unknown cause. EXAM: CT HEAD WITHOUT CONTRAST TECHNIQUE: Contiguous axial images were obtained from the base of the skull through the vertex without intravenous contrast. RADIATION DOSE REDUCTION: This exam was performed according to the departmental dose-optimization program which includes automated exposure control, adjustment of the mA and/or kV according to patient size and/or use of iterative reconstruction technique. COMPARISON:  Head CT 09/01/2022. FINDINGS: Brain: Stable size and  configuration of the shunted ventricles with unchanged right posterior approach ventricular shunt catheter with tip terminating in the anterior body of the right lateral ventricle. Small bilateral subdural collections, measuring up to 5 mm in thickness on the left and 6 mm in thickness on the right, suggestive of overshunting. Unchanged encephalomalacia in the bilateral frontal lobes. Vascular: No hyperdense vessel or unexpected calcification. Skull: Old right frontal burr hole. Sinuses/Orbits: Unremarkable. Other: None. IMPRESSION: 1. Stable size and configuration of  the shunted ventricles with unchanged right posterior approach ventricular shunt catheter with tip terminating in the anterior body of the right lateral ventricle. 2. Small bilateral subdural collections, measuring up to 5 mm in thickness on the left and 6 mm in thickness on the right, suggestive of overshunting. Electronically Signed   By: Orvan Falconer M.D.   On: 03/04/2023 14:02      Assessment/Plan Principal Problem:   Acute renal failure superimposed on stage 4 chronic kidney disease (HCC) Active Problems:   Acute metabolic encephalopathy   Chronic pain syndrome   Essential hypertension   Dyslipidemia   Thrombocytopenia (HCC)   Type II diabetes mellitus with renal manifestations (HCC)   Iron deficiency anemia   COPD (chronic obstructive pulmonary disease) (HCC)   Depression with anxiety   Seizure (HCC)   Tobacco use   Assessment and Plan:  Acute renal failure superimposed on stage 4 chronic kidney disease (HCC): Likely due to dehydration and continuation of Lasix and lisinopril.  -Placed on MedSurg bed for observation -Hold Lasix and lisinopril -IV fluid: 1 L normal saline, Naima 100 cc/h -Follow-up urinalysis -Catheter renal ultrasound  Acute metabolic encephalopathy: Etiology is not clear.  CT head is negative for acute intra cranial issues.  Differential diagnosis include dementia with behavior disturbance,  delirium.  Chronic pain and worse renal function may have contributed partially. -Frequent neurocheck -Fall precaution -As needed Haldol for agitation -Hold sedative medications: Flexeril, hydroxyzine, tizanidine, Cymbalta -Check vitamin B12 level  Chronic pain syndrome -Continue home as needed oxycodone -As needed Tylenol  Essential hypertension: Blood pressure 113/65 -IV hydralazine as needed -Hold Lasix and lisinopril due to worsening renal function  Dyslipidemia -Crestor and fenofibrate  Thrombocytopenia (HCC): This is chronic issue.  Platelet 99 -Follow up with CBC  Type II diabetes mellitus with renal manifestations [facility]): Patient is taking [place].  Recent A1c 4.  6, well-controlled. -Sliding scale insulin  Iron deficiency anemia: Hemoglobin stable, 10.4.  Patient's not taking iron supplement currently -Follow-up with CBC  COPD (chronic obstructive pulmonary disease) (HCC): Stable -As needed albuterol  Depression with anxiety -Hold Cymbalta since patient could not tolerate this medication per her daughter  Seizure (HCC) -Seizure precaution -Continue home Depakote  Tobacco use -Nicotine patch        DVT ppx: SCD  Code Status: Full code per his daughter who is POA  Family Communication: Yes, patient's daughter  by phone  Disposition Plan:  Anticipate discharge back to previous environment  Consults called:  none  Admission status and Level of care: Med-Surg:   for obs   Dispo: The patient is from: Home              Anticipated d/c is to: Home              Anticipated d/c date is: 1 day              Patient currently is not medically stable to d/c.    Severity of Illness:  The appropriate patient status for this patient is OBSERVATION. Observation status is judged to be reasonable and necessary in order to provide the required intensity of service to ensure the patient's safety. The patient's presenting symptoms, physical exam findings, and  initial radiographic and laboratory data in the context of their medical condition is felt to place them at decreased risk for further clinical deterioration. Furthermore, it is anticipated that the patient will be medically stable for discharge from the hospital within 2 midnights of admission.  Date of Service 03/04/2023    Lorretta Harp [facility]   If 7PM-7AM, please contact night-coverage [email or web address] 03/04/2023, 6:26 PM

## 2023-03-04 NOTE — Progress Notes (Signed)
Patient arrived to floor A&Ox3, answering orientation questions appropriately.  Around 1800, patient became increasingly confused, agitated, and trying to get up so he could get dressed and leave.  Able to redirect patient, called patient's daughter so he could talk to her.  MD notified and orders received.  Continue to monitor.

## 2023-03-04 NOTE — ED Provider Notes (Signed)
[facility] Provider Note    Event Date/Time   First MD Initiated Contact with Patient 03/04/23 1308     (approximate)   History   Altered Mental Status   HPI  LORAIN WHORTON is a 67 y.o. male with history of diabetes, hypertension, chronic pain syndrome brought in for evaluation of possible altered mental status.  Reportedly patient may have a case of mild dementia, family checked on the patient this morning and felt that he was more confused than typical.  Patient adamantly denies this.  He is frustrated that he was brought to the emergency department.  He reports he is at his baseline.  He denies neurodeficits.  No fevers or chills.     Physical Exam   Triage Vital Signs: ED Triage Vitals  Enc Vitals Group     BP --      Pulse Rate 03/04/23 1316 88     Resp 03/04/23 1316 13     Temp 03/04/23 1319 98.8 F (37.1 C)     Temp Source 03/04/23 1319 Oral     SpO2 03/04/23 1316 100 %     Weight --      Height --      Head Circumference --      Peak Flow --      Pain Score 03/04/23 1315 0     Pain Loc --      Pain Edu? --      Excl. in GC? --     Most recent vital signs: Vitals:   03/04/23 1316 03/04/23 1319  Pulse: 88   Resp: 13   Temp:  98.8 F (37.1 C)  SpO2: 100%      General: Awake, no distress.  CV:  Good peripheral perfusion.  Resp:  Normal effort.  Abd:  No distention.  Other:  Normal neuroexam, cranial nerves II through XII are normal. Dry mucous membranes  ED Results / Procedures / Treatments   Labs (all labs ordered are listed, but only abnormal results are displayed) Labs Reviewed  CBC - Abnormal; Notable for the following components:      Result Value   RBC 3.25 (*)    Hemoglobin 10.4 (*)    HCT 33.6 (*)    MCV 103.4 (*)    Platelets 99 (*)    All other components within normal limits  COMPREHENSIVE METABOLIC PANEL - Abnormal; Notable for the following components:   Chloride 118 (*)    CO2 12 (*)    Glucose,  Bld 109 (*)    BUN 110 (*)    Creatinine, Ser 3.61 (*)    Calcium 8.7 (*)    Albumin 3.3 (*)    Total Bilirubin 1.5 (*)    GFR, Estimated 18 (*)    All other components within normal limits  CBG MONITORING, ED     EKG  ED ECG REPORT I, Jene Every, the attending physician, personally viewed and interpreted this ECG.  Date: 03/04/2023  Rhythm: normal sinus rhythm QRS Axis: normal Intervals: normal ST/T Wave abnormalities: normal Narrative Interpretation: no evidence of acute ischemia    RADIOLOGY  CT head without acute abnormality   PROCEDURES:  Critical Care performed:   Procedures   MEDICATIONS ORDERED IN ED: Medications  0.9 %  sodium chloride infusion (has no administration in time range)  0.9 %  sodium chloride infusion (has no administration in time range)     IMPRESSION / MDM / ASSESSMENT AND PLAN /  ED COURSE  I reviewed the triage vital signs and the nursing notes. Patient's presentation is most consistent with acute presentation with potential threat to life or bodily function.  Patient overall well-appearing and in no acute distress.  Review of medical records demonstrates the patient was admitted for acute kidney injury in the past and discharged on September 08, 2022  Overall he is well-appearing here, differential includes dementia, medication side effect, dehydration, less likely CVA  Will obtain labs, CT head and reevaluate.  CT head is unremarkable, labs however demonstrated acute kidney injury with a creatinine of 3.6 and a BUN of 110.  Discussed with patient and daughter that this will require hospitalization, IV fluids, likely related to severe dehydration.  Daughter reports the patient frequently drinks Pepsi, daughter is the power of attorney and reports she has decision-making capacity for the patient given his history of Parkinson's and dementia        FINAL CLINICAL IMPRESSION(S) / ED DIAGNOSES   Final diagnoses:  AKI (acute  kidney injury) (HCC)  Dehydration     Rx / DC Orders   ED Discharge Orders     None        Note:  This document was prepared using Dragon voice recognition software and may include unintentional dictation errors.   Jene Every, MD 03/04/23 1451

## 2023-03-04 NOTE — Plan of Care (Signed)
  Problem: Skin Integrity: Goal: Risk for impaired skin integrity will decrease Outcome: Progressing   Problem: Nutritional: Goal: Maintenance of adequate nutrition will improve Outcome: Progressing   Problem: Fluid Volume: Goal: Ability to maintain a balanced intake and output will improve Outcome: Progressing

## 2023-03-05 ENCOUNTER — Observation Stay: Payer: 59

## 2023-03-05 DIAGNOSIS — F05 Delirium due to known physiological condition: Secondary | ICD-10-CM | POA: Diagnosis not present

## 2023-03-05 DIAGNOSIS — N179 Acute kidney failure, unspecified: Secondary | ICD-10-CM | POA: Diagnosis present

## 2023-03-05 DIAGNOSIS — E872 Acidosis, unspecified: Secondary | ICD-10-CM | POA: Diagnosis present

## 2023-03-05 DIAGNOSIS — E785 Hyperlipidemia, unspecified: Secondary | ICD-10-CM | POA: Diagnosis present

## 2023-03-05 DIAGNOSIS — M79605 Pain in left leg: Secondary | ICD-10-CM | POA: Diagnosis present

## 2023-03-05 DIAGNOSIS — F32A Depression, unspecified: Secondary | ICD-10-CM | POA: Diagnosis present

## 2023-03-05 DIAGNOSIS — Z888 Allergy status to other drugs, medicaments and biological substances status: Secondary | ICD-10-CM | POA: Diagnosis not present

## 2023-03-05 DIAGNOSIS — Z982 Presence of cerebrospinal fluid drainage device: Secondary | ICD-10-CM | POA: Diagnosis not present

## 2023-03-05 DIAGNOSIS — E870 Hyperosmolality and hypernatremia: Secondary | ICD-10-CM | POA: Diagnosis present

## 2023-03-05 DIAGNOSIS — Z79891 Long term (current) use of opiate analgesic: Secondary | ICD-10-CM | POA: Diagnosis not present

## 2023-03-05 DIAGNOSIS — Z66 Do not resuscitate: Secondary | ICD-10-CM | POA: Diagnosis present

## 2023-03-05 DIAGNOSIS — M79604 Pain in right leg: Secondary | ICD-10-CM | POA: Diagnosis present

## 2023-03-05 DIAGNOSIS — E1122 Type 2 diabetes mellitus with diabetic chronic kidney disease: Secondary | ICD-10-CM | POA: Diagnosis present

## 2023-03-05 DIAGNOSIS — J439 Emphysema, unspecified: Secondary | ICD-10-CM | POA: Diagnosis present

## 2023-03-05 DIAGNOSIS — G9341 Metabolic encephalopathy: Secondary | ICD-10-CM | POA: Diagnosis not present

## 2023-03-05 DIAGNOSIS — G894 Chronic pain syndrome: Secondary | ICD-10-CM | POA: Diagnosis present

## 2023-03-05 DIAGNOSIS — Y92009 Unspecified place in unspecified non-institutional (private) residence as the place of occurrence of the external cause: Secondary | ICD-10-CM | POA: Diagnosis not present

## 2023-03-05 DIAGNOSIS — E860 Dehydration: Secondary | ICD-10-CM | POA: Diagnosis present

## 2023-03-05 DIAGNOSIS — D696 Thrombocytopenia, unspecified: Secondary | ICD-10-CM | POA: Diagnosis not present

## 2023-03-05 DIAGNOSIS — Z8661 Personal history of infections of the central nervous system: Secondary | ICD-10-CM | POA: Diagnosis not present

## 2023-03-05 DIAGNOSIS — G928 Other toxic encephalopathy: Secondary | ICD-10-CM | POA: Diagnosis present

## 2023-03-05 DIAGNOSIS — D61818 Other pancytopenia: Secondary | ICD-10-CM | POA: Diagnosis present

## 2023-03-05 DIAGNOSIS — R569 Unspecified convulsions: Secondary | ICD-10-CM | POA: Diagnosis present

## 2023-03-05 DIAGNOSIS — F0394 Unspecified dementia, unspecified severity, with anxiety: Secondary | ICD-10-CM | POA: Diagnosis present

## 2023-03-05 DIAGNOSIS — I129 Hypertensive chronic kidney disease with stage 1 through stage 4 chronic kidney disease, or unspecified chronic kidney disease: Secondary | ICD-10-CM | POA: Diagnosis present

## 2023-03-05 DIAGNOSIS — F17210 Nicotine dependence, cigarettes, uncomplicated: Secondary | ICD-10-CM | POA: Diagnosis present

## 2023-03-05 DIAGNOSIS — R4182 Altered mental status, unspecified: Secondary | ICD-10-CM | POA: Diagnosis not present

## 2023-03-05 DIAGNOSIS — N178 Other acute kidney failure: Secondary | ICD-10-CM | POA: Diagnosis not present

## 2023-03-05 DIAGNOSIS — F0393 Unspecified dementia, unspecified severity, with mood disturbance: Secondary | ICD-10-CM | POA: Diagnosis present

## 2023-03-05 DIAGNOSIS — N184 Chronic kidney disease, stage 4 (severe): Secondary | ICD-10-CM | POA: Diagnosis present

## 2023-03-05 LAB — BASIC METABOLIC PANEL
Anion gap: 7 (ref 5–15)
BUN: 81 mg/dL — ABNORMAL HIGH (ref 8–23)
CO2: 15 mmol/L — ABNORMAL LOW (ref 22–32)
Calcium: 8.6 mg/dL — ABNORMAL LOW (ref 8.9–10.3)
Chloride: 125 mmol/L — ABNORMAL HIGH (ref 98–111)
Creatinine, Ser: 2.71 mg/dL — ABNORMAL HIGH (ref 0.61–1.24)
GFR, Estimated: 25 mL/min — ABNORMAL LOW (ref >60)
Glucose, Bld: 110 mg/dL — ABNORMAL HIGH (ref 70–99)
Potassium: 3.7 mmol/L (ref 3.5–5.1)
Sodium: 147 mmol/L — ABNORMAL HIGH (ref 135–145)

## 2023-03-05 LAB — CBC
HCT: 29.2 % — ABNORMAL LOW (ref 39.0–52.0)
Hemoglobin: 9.2 g/dL — ABNORMAL LOW (ref 13.0–17.0)
MCH: 31.8 pg (ref 26.0–34.0)
MCHC: 31.5 g/dL (ref 30.0–36.0)
MCV: 101 fL — ABNORMAL HIGH (ref 80.0–100.0)
Platelets: 90 10*3/uL — ABNORMAL LOW (ref 150–400)
RBC: 2.89 MIL/uL — ABNORMAL LOW (ref 4.22–5.81)
RDW: 14.5 % (ref 11.5–15.5)
WBC: 4.4 10*3/uL (ref 4.0–10.5)
nRBC: 0 % (ref 0.0–0.2)

## 2023-03-05 LAB — VITAMIN B12: Vitamin B-12: 400 pg/mL (ref 180–914)

## 2023-03-05 LAB — GLUCOSE, CAPILLARY
Glucose-Capillary: 113 mg/dL — ABNORMAL HIGH (ref 70–99)
Glucose-Capillary: 80 mg/dL (ref 70–99)
Glucose-Capillary: 95 mg/dL (ref 70–99)

## 2023-03-05 MED ORDER — MORPHINE SULFATE (PF) 2 MG/ML IV SOLN
2.0000 mg | INTRAVENOUS | Status: DC | PRN
  Administered 2023-03-05 (×2): 2 mg via INTRAVENOUS
  Filled 2023-03-05 (×2): qty 1

## 2023-03-05 MED ORDER — LACTATED RINGERS IV SOLN: INTRAVENOUS | Status: DC

## 2023-03-05 NOTE — TOC CM/SW Note (Signed)
Received notification from MD that patient to leave [facility] supervisor aware Bedside RN to notify TOC if plan changes

## 2023-03-05 NOTE — Evaluation (Signed)
Physical Therapy Evaluation Patient Details Name: Albert Figueroa MRN: [id number] DOB: 01-27-1956 Today's Date: 03/05/2023  History of Present Illness  Aleczander Mahon is a 67yoM who comes to [facility] after acute onset weakness, AMS. Pt dehydrated. Brain imaging negative for CVA. 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  Clinical Impression  Pt awake in bed on entry, reports mentation is find, never was altered despite reports. Pt endorses acute weakness in legs again 3rd episode of dehydration due to drinking pepsi and no water. Pt able to get to EOB with heavy effort, HOB elevated, but unable to stand from elevated surface without minA, eventually can stand minGuard from VERY elevated surface but this is not a proxy for any real functional transfers scenario unless pt uses a barstool for all sitting. Pt reports he cannot return to his residence as his landlord said so. Pt reports to very still very much off baseline in transfers and AMB, poor insight into how this would make leaking AMA a poor decision.      Recommendations for follow up therapy are one component of a multi-disciplinary discharge planning process, led by the attending physician.  Recommendations may be updated based on patient status, additional functional criteria and insurance authorization.  Follow Up Recommendations Can patient physically be transported by private vehicle: No     Assistance Recommended at Discharge Frequent or constant Supervision/Assistance  Patient can return home with the following  A little help with walking and/or transfers;A lot of help with walking and/or transfers;A little help with bathing/dressing/bathroom    Equipment Recommendations None recommended by PT  Recommendations for Other Services       Functional Status  Assessment Patient has had a recent decline in their functional status and demonstrates the ability to make significant improvements in function in a reasonable and predictable amount of time.     Precautions / Restrictions Precautions Precautions: Fall Restrictions Weight Bearing Restrictions: No      Mobility  Bed Mobility Overal bed mobility: Needs Assistance Bed Mobility: Supine to Sit     Supine to sit: Min guard, HOB elevated          Transfers Overall transfer level: Needs assistance Equipment used: Rolling walker (2 wheels) Transfers: Sit to/from Stand Sit to Stand: Mod assist           General transfer comment: very weak, limited problem solving    Ambulation/Gait Ambulation/Gait assistance: Min guard Gait Distance (Feet): 40 Feet Assistive device: Rolling walker (2 wheels)         General Gait Details: slow, step-to gait  Stairs            Wheelchair Mobility    Modified Rankin (Stroke Patients Only)       Balance                                             Pertinent Vitals/Pain Pain Assessment Pain Assessment: No/denies pain    Home Living Family/patient expects to be discharged to:: Unsure Living Arrangements: Alone                 Additional Comments: landlord recently told him he can't return; Pt previously evicted from his sister's 2nd floor apartment, he says she  feels his dementia diagnosis puts her safety at risk.    Prior Function Prior Level of Function : Independent/Modified Independent                     Hand Dominance        Extremity/Trunk Assessment                Communication      Cognition Arousal/Alertness: Awake/alert Behavior During Therapy: WFL for tasks assessed/performed Overall Cognitive Status: History of cognitive impairments - at baseline                                          General Comments      Exercises Other  Exercises Other Exercises: Marching in place with RW x12   Assessment/Plan    PT Assessment Patient needs continued PT services  PT Problem List Decreased range of motion;Decreased activity tolerance;Decreased balance;Decreased mobility;Decreased coordination;Decreased knowledge of use of DME;Decreased safety awareness;Decreased strength       PT Treatment Interventions DME instruction;Gait training;Stair training;Functional mobility training;Therapeutic activities;Therapeutic exercise;Balance training    PT Goals (Current goals can be found in the Care Plan section)  Acute Rehab PT Goals Patient Stated Goal: leave hospital and get $190buck from his sister PT Goal Formulation: With patient Time For Goal Achievement: 03/19/23 Potential to Achieve Goals: Fair    Frequency Min 3X/week     Co-evaluation               AM-PAC PT "6 Clicks" Mobility  Outcome Measure Help needed turning from your back to your side while in a flat bed without using bedrails?: A Lot Help needed moving from lying on your back to sitting on the side of a flat bed without using bedrails?: A Lot Help needed moving to and from a bed to a chair (including a wheelchair)?: A Lot Help needed standing up from a chair using your arms (e.g., wheelchair or bedside chair)?: A Lot Help needed to walk in hospital room?: A Little Help needed climbing 3-5 steps with a railing? : A Little 6 Click Score: 14    End of Session Equipment Utilized During Treatment: Gait belt Activity Tolerance: Patient tolerated treatment well;No increased pain Patient left: in chair;with nursing/sitter in room;with call bell/phone within reach Nurse Communication: Mobility status PT Visit Diagnosis: Difficulty in walking, not elsewhere classified (R26.2);Other abnormalities of gait and mobility (R26.89);Repeated falls (R29.6);Muscle weakness (generalized) (M62.81)    Time: 1028-1100 PT Time Calculation (min) (ACUTE ONLY): 32  min   Charges:   PT Evaluation $PT Eval Low Complexity: 1 Low        11:25 AM, 03/05/23 Rosamaria Lints, PT, DPT Physical Therapist - [facility]  [phone] (ASCOM)    Shasha Buchbinder C 03/05/2023, 11:21 AM

## 2023-03-05 NOTE — Progress Notes (Signed)
PROGRESS NOTE  Albert Figueroa    DOB: [birth year], 67 y.o.  ZOX:[id number]    Code Status: Full Code   DOA: 03/04/2023   LOS: 0   Brief hospital course  TIMTHY Figueroa is a 67 y.o. 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  They presented from home to the ED on 03/04/2023 with AMS x 1 days. Per admitting physician, he was confused but also oriented x3 on presentation.   In the ED, it was found that they had completely normal vital signs.  Significant findings included WBC 5.5, AKI with creatinine 3.61, BUN 110, GFR 18 (recent baseline creatinine 1.9 on 01/27/2023). CT of head: 1. Stable size and configuration of the shunted ventricles with unchanged right posterior approach ventricular shunt catheter with tip terminating in the anterior body of the right lateral ventricle. 2. Small bilateral subdural collections, measuring up to 5 mm in thickness on the left and 6 mm in thickness on the right, suggestive of overshunting.   EKG: Sinus rhythm, QTc 430, low voltage, Q-wave in lead III  They were initially treated with IV fluids.   Patient was admitted to medicine service for further workup and management of AKI and AMS as outlined in detail below.  03/05/23 -stable mental status. Oriented x3. He states that he is going to leave AMA due to financial and housing constraints. He endorses that he does not like water so doesn't drink it so that is why is is always dehydrated.  Assessment & Plan  Principal Problem:   Acute renal failure superimposed on stage 4 chronic kidney disease (HCC) Active Problems:   Acute metabolic encephalopathy   Chronic pain syndrome   Essential hypertension   Dyslipidemia   Thrombocytopenia (HCC)   Type II diabetes mellitus with renal manifestations (HCC)   Iron deficiency  anemia   COPD (chronic obstructive pulmonary disease) (HCC)   Depression with anxiety   Seizure (HCC)   Tobacco use   AKI (acute kidney injury) (HCC)  AKI: Likely due to dehydration and continuation of Lasix and lisinopril. Cr improved 3.61>2.71 but still above baseline around 1.5.  Renal US unremarkable.  -Hold Lasix and lisinopril - continue IVF, encourage PO hydration - metabolic panel am - follow UOP   Acute metabolic encephalopathy: per report, must have been mild since he was oriented x3 on presentation. He is fully oriented today. Head CT was non-acute and showed ventricular shunt in place. Radiology also read "overshunting". Discussed this today with neurosurgeon on-call, Dr. Katrinka Blazing, who reviewed the images and stated that it was a chronic change of over drainage and does not require revision if he has no symptoms.  Concern for several sedating medications at home which will need review by PCP and reconciliation on dc.  -Frequent neurocheck -Fall precaution -Hold sedative medications: Flexeril, hydroxyzine, tizanidine - PT/OT evaluated and recommended SNF. TOC engaged   Chronic pain syndrome -Continue home as needed oxycodone -As needed Tylenol   Essential hypertension: Blood pressure 113/65 -Hold Lasix and lisinopril due to worsening renal function. Restart as needed and tolerating with renal function   Dyslipidemia -Crestor and fenofibrate   Thrombocytopenia (HCC): This is chronic issue.  Platelet 99>90. No active bleeding.  -Follow up with CBC   Type II diabetes mellitus with renal manifestations [facility]): Patient is taking [place].  Recent A1c 4. 6, well-controlled. -  no insulin needed. Monitor on labs   Iron deficiency anemia: Hemoglobin stable, 10.4.  Patient's not taking iron supplement currently -Follow-up with CBC   COPD (chronic obstructive pulmonary disease) (HCC): Stable -As needed albuterol   Depression with anxiety -continue home cymbalta   Seizure  (HCC) -Seizure precaution -Continue home Depakote   Tobacco use- 96 pack year history -Nicotine patch  Body mass index is 27.95 kg/m.  VTE ppx: SCDs Start: 03/04/23 1501   Diet:     Diet   Diet heart healthy/carb modified Room service appropriate? Yes; Fluid consistency: Thin   Consultants: Discussed imaging with neurosurgery   Subjective 03/05/23    Pt reports no complaints. Denies headache, nausea, vision changes. He states that he is going to leave today due to needing to get money from his sister and that his landlord has evicted him. He tried to get a ride home but was unsuccessful.    Objective   Vitals:   03/04/23 2157 03/05/23 0408 03/05/23 0800 03/05/23 1507  BP: 127/66 114/65 112/66 114/64  Pulse: 88 85 79 80  Resp: 18 16 16 18   Temp: 98.1 F (36.7 C) 97.8 F (36.6 C) 97.6 F (36.4 C) 97.9 F (36.6 C)  TempSrc: Oral Oral Oral Oral  SpO2: 98% 100%  100%  Weight:      Height:        Intake/Output Summary (Last 24 hours) at 03/05/2023 1635 Last data filed at 03/05/2023 1521 Gross per 24 hour  Intake 1283.57 ml  Output 2950 ml  Net -1666.43 ml   Filed Weights   03/04/23 1615  Weight: 90.9 kg     Physical Exam:  General: awake, alert, NAD HEENT: atraumatic, clear conjunctiva, anicteric sclera, MMM, hearing grossly normal Respiratory: normal respiratory effort. Cardiovascular: quick capillary refill, normal S1/S2, RRR, no JVD, murmurs Gastrointestinal: soft, NT, ND Nervous: A&O x3. no gross focal neurologic deficits, normal speech Extremities: moves all equally, no edema, normal tone Skin: dry, intact, normal temperature, normal color. No rashes, lesions or ulcers on exposed skin Psychiatry: normal mood, congruent affect  Labs   I have personally reviewed the following labs and imaging studies CBC    Component Value Date/Time   WBC 4.4 03/05/2023 0642   RBC 2.89 (L) 03/05/2023 0642   HGB 9.2 (L) 03/05/2023 0642   HGB 15.5 10/06/2012 1710    HCT 29.2 (L) 03/05/2023 0642   HCT 46.7 10/06/2012 1710   PLT 90 (L) 03/05/2023 0642   PLT 233 10/06/2012 1710   MCV 101.0 (H) 03/05/2023 0642   MCV 89 10/06/2012 1710   MCH 31.8 03/05/2023 0642   MCHC 31.5 03/05/2023 0642   RDW 14.5 03/05/2023 0642   RDW 13.5 10/06/2012 1710   LYMPHSABS 1.5 09/09/2022 0554   MONOABS 0.4 09/09/2022 0554   EOSABS 0.1 09/09/2022 0554   BASOSABS 0.0 09/09/2022 0554      Latest Ref Rng & Units 03/05/2023    6:42 AM 03/04/2023    1:18 PM 09/06/2022    6:28 AM  BMP  Glucose 70 - 99 mg/dL 161  096  84   BUN 8 - 23 mg/dL 81  045  28   Creatinine 0.61 - 1.24 mg/dL 4.09  8.11  9.14   Sodium 135 - 145 mmol/L 147  144  145   Potassium 3.5 - 5.1 mmol/L 3.7  4.2  4.2   Chloride 98 - 111 mmol/L 125  118  122   CO2 22 - 32 mmol/L 15  12  21   Calcium 8.9 - 10.3 mg/dL 8.6  8.7  8.5     US RENAL  Result Date: 03/05/2023 CLINICAL DATA:  Acute kidney injury EXAM: RENAL / URINARY TRACT ULTRASOUND COMPLETE COMPARISON:  09/02/2022 FINDINGS: Right Kidney: Renal measurements: 11.7 x 5.8 x 4.7 cm = volume: 166 mL. Echogenicity within normal limits. No mass or hydronephrosis visualized. Multiple simple cysts are present measuring up to 8.1 cm which do not require dedicated follow-up. Left Kidney: Renal measurements: 5.7 x 6.4 x 5.1 cm = volume: 220 mL. Echogenicity within normal limits. No mass or hydronephrosis visualized. 3.9 cm simple cyst does not require further imaging follow-up. Bladder: Appears normal for degree of bladder distention. Other: None. IMPRESSION: No significant sonographic abnormality of the kidneys. Electronically Signed   By: Acquanetta Belling M.D.   On: 03/05/2023 08:02   CT Head Wo Contrast  Result Date: 03/04/2023 CLINICAL DATA:  Mental status change, unknown cause. EXAM: CT HEAD WITHOUT CONTRAST TECHNIQUE: Contiguous axial images were obtained from the base of the skull through the vertex without intravenous contrast. RADIATION DOSE REDUCTION: This exam was  performed according to the departmental dose-optimization program which includes automated exposure control, adjustment of the mA and/or kV according to patient size and/or use of iterative reconstruction technique. COMPARISON:  Head CT 09/01/2022. FINDINGS: Brain: Stable size and configuration of the shunted ventricles with unchanged right posterior approach ventricular shunt catheter with tip terminating in the anterior body of the right lateral ventricle. Small bilateral subdural collections, measuring up to 5 mm in thickness on the left and 6 mm in thickness on the right, suggestive of overshunting. Unchanged encephalomalacia in the bilateral frontal lobes. Vascular: No hyperdense vessel or unexpected calcification. Skull: Old right frontal burr hole. Sinuses/Orbits: Unremarkable. Other: None. IMPRESSION: 1. Stable size and configuration of the shunted ventricles with unchanged right posterior approach ventricular shunt catheter with tip terminating in the anterior body of the right lateral ventricle. 2. Small bilateral subdural collections, measuring up to 5 mm in thickness on the left and 6 mm in thickness on the right, suggestive of overshunting. Electronically Signed   By: Orvan Falconer M.D.   On: 03/04/2023 14:02    Disposition Plan & Communication  Patient status: Inpatient  Admitted From: Home Planned disposition location: Skilled nursing facility Anticipated discharge date: 6/6 pending SNF placement  Family Communication: none at bedside    Author: Leeroy Bock, DO [facility] 03/05/2023, 4:35 PM   Available by Epic secure chat 7AM-7PM. If 7PM-7AM, please contact night-coverage.  [facility] contact information found on [email or web address].

## 2023-03-05 NOTE — Progress Notes (Signed)
Patient slept well, he woke up once to complain of 8/10 back pain. MD was notified and he received Morphine IV with positive results. No distress or issues noted this shift.

## 2023-03-06 DIAGNOSIS — N178 Other acute kidney failure: Secondary | ICD-10-CM

## 2023-03-06 DIAGNOSIS — N184 Chronic kidney disease, stage 4 (severe): Secondary | ICD-10-CM | POA: Diagnosis not present

## 2023-03-06 LAB — CBC
HCT: 27.4 % — ABNORMAL LOW (ref 39.0–52.0)
Hemoglobin: 8.6 g/dL — ABNORMAL LOW (ref 13.0–17.0)
MCH: 32 pg (ref 26.0–34.0)
MCHC: 31.4 g/dL (ref 30.0–36.0)
MCV: 101.9 fL — ABNORMAL HIGH (ref 80.0–100.0)
Platelets: 81 10*3/uL — ABNORMAL LOW (ref 150–400)
RBC: 2.69 MIL/uL — ABNORMAL LOW (ref 4.22–5.81)
RDW: 14.4 % (ref 11.5–15.5)
WBC: 3.8 10*3/uL — ABNORMAL LOW (ref 4.0–10.5)
nRBC: 0 % (ref 0.0–0.2)

## 2023-03-06 LAB — GLUCOSE, CAPILLARY
Glucose-Capillary: 96 mg/dL (ref 70–99)
Glucose-Capillary: 100 mg/dL — ABNORMAL HIGH (ref 70–99)
Glucose-Capillary: 90 mg/dL (ref 70–99)
Glucose-Capillary: 92 mg/dL (ref 70–99)

## 2023-03-06 LAB — BASIC METABOLIC PANEL
Anion gap: 7 (ref 5–15)
BUN: 53 mg/dL — ABNORMAL HIGH (ref 8–23)
CO2: 15 mmol/L — ABNORMAL LOW (ref 22–32)
Calcium: 8.8 mg/dL — ABNORMAL LOW (ref 8.9–10.3)
Chloride: 123 mmol/L — ABNORMAL HIGH (ref 98–111)
Creatinine, Ser: 2.15 mg/dL — ABNORMAL HIGH (ref 0.61–1.24)
GFR, Estimated: 33 mL/min — ABNORMAL LOW (ref >60)
Glucose, Bld: 101 mg/dL — ABNORMAL HIGH (ref 70–99)
Potassium: 4.3 mmol/L (ref 3.5–5.1)
Sodium: 145 mmol/L (ref 135–145)

## 2023-03-06 MED ORDER — ENOXAPARIN SODIUM 40 MG/0.4ML IJ SOSY
40.0000 mg | PREFILLED_SYRINGE | Freq: Every day | INTRAMUSCULAR | Status: DC
  Administered 2023-03-06 – 2023-03-10 (×5): 40 mg via SUBCUTANEOUS
  Filled 2023-03-06 (×5): qty 0.4

## 2023-03-06 NOTE — Progress Notes (Signed)
PROGRESS NOTE  Albert Figueroa    DOB: 1956/04/20, 67 y.o.  JYN:[id number]    Code Status: Full Code   DOA: 03/04/2023   LOS: 1   Brief hospital course  [name] is a 67 y.o. 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  They presented from home to the ED on 03/04/2023 with AMS x 1 days. Per admitting physician, he was confused but also oriented x3 on presentation.   In the ED, it was found that they had completely normal vital signs.  Significant findings included WBC 5.5, AKI with creatinine 3.61, BUN 110, GFR 18 (recent baseline creatinine 1.9 on 01/27/2023). CT of head: 1. Stable size and configuration of the shunted ventricles with unchanged right posterior approach ventricular shunt catheter with tip terminating in the anterior body of the right lateral ventricle. 2. Small bilateral subdural collections, measuring up to 5 mm in thickness on the left and 6 mm in thickness on the right, suggestive of overshunting.   EKG: Sinus rhythm, QTc 430, low voltage, Q-wave in lead III  They were initially treated with IV fluids.   Patient was admitted to medicine service for further workup and management of AKI and AMS as outlined in detail below.  03/05/23 -stable mental status. Oriented x3. He states that he is going to leave AMA due to financial and housing constraints. He endorses that he does not like water so doesn't drink it so that is why is is always dehydrated.  Assessment & Plan  Principal Problem:   Acute renal failure superimposed on stage 4 chronic kidney disease (HCC) Active Problems:   Acute metabolic encephalopathy   Chronic pain syndrome   Essential hypertension   Dyslipidemia   Thrombocytopenia (HCC)   Type II diabetes mellitus with renal manifestations (HCC)   Iron deficiency anemia    COPD (chronic obstructive pulmonary disease) (HCC)   Depression with anxiety   Seizure (HCC)   Tobacco use   AKI (acute kidney injury) (HCC)   Dehydration  AKI: Likely due to dehydration and continuation of Lasix and lisinopril. Cr improved 3.61>2.71 but still above baseline around 1.5.  Renal US unremarkable.  -Hold Lasix and lisinopril - continue IVF, encourage PO hydration, patient reports that he still does not like to drink water and gave him other alternatives such as Gatorade, fresh juices, he said he will try today. - metabolic panel am if creatinine is close to baseline and we can establish that the patient is having adequate p.o. intake most likely can discharge tomorrow - follow UOP   Acute metabolic encephalopathy: per report, must have been mild since he was oriented x3 on presentation. He is fully oriented today. Head CT was non-acute and showed ventricular shunt in place. Radiology also read "overshunting". Discussed this today with neurosurgeon on-call, Dr. Katrinka Blazing, who reviewed the images and stated that it was a chronic change of over drainage and does not require revision if he has no symptoms.  Concern for several sedating medications at home which will need review by PCP and reconciliation on dc.  -Frequent neurocheck -Fall precaution -Hold sedative medications: Flexeril, hydroxyzine, tizanidine - PT/OT evaluated and recommended SNF. TOC engaged   Chronic pain syndrome -Continue home as needed oxycodone -As needed Tylenol   Essential hypertension: Blood pressure 113/65 -Hold Lasix and lisinopril due to worsening renal function.  Restart as needed and tolerating with renal function   Dyslipidemia -Crestor and fenofibrate   Thrombocytopenia (HCC): This is chronic issue.  Platelet 99>90. No active bleeding.  -Follow up with CBC   Type II diabetes mellitus with renal manifestations [facility]): Patient is taking [place].  Recent A1c 4. 6, well-controlled. -no insulin  needed. Monitor on labs   Iron deficiency anemia: Hemoglobin stable, 10.4.  Patient's not taking iron supplement currently -Follow-up with CBC   COPD (chronic obstructive pulmonary disease) (HCC): Stable -As needed albuterol   Depression with anxiety -continue home cymbalta   Seizure (HCC) -Seizure precaution -Continue home Depakote   Tobacco use- 96 pack year history -Nicotine patch  Body mass index is 27.95 kg/m.  VTE ppx: enoxaparin (LOVENOX) injection 40 mg Start: 03/06/23 2200 SCDs Start: 03/04/23 1501   Diet:     Diet   Diet heart healthy/carb modified Room service appropriate? Yes; Fluid consistency: Thin   Consultants: Discussed imaging with neurosurgery   Subjective 03/06/23    Patient was seen and examined at bedside today.  Pt reports no complaints.  Review of labs shows patient's creatinine is continuing to improve.   Objective   Vitals:   03/05/23 1507 03/05/23 2114 03/06/23 0327 03/06/23 0743  BP: 114/64 115/70 114/65 118/63  Pulse: 80 77 79 90  Resp: 18  20 20   Temp: 97.9 F (36.6 C) 98.1 F (36.7 C) 98 F (36.7 C) 97.9 F (36.6 C)  TempSrc: Oral   Oral  SpO2: 100%  100% 95%  Weight:      Height:        Intake/Output Summary (Last 24 hours) at 03/06/2023 1151 Last data filed at 03/06/2023 1120 Gross per 24 hour  Intake 1558.86 ml  Output 1500 ml  Net 58.86 ml    Filed Weights   03/04/23 1615  Weight: 90.9 kg     Physical Exam:  General: awake, alert, NAD HEENT: atraumatic, clear conjunctiva, anicteric sclera, MMM, hearing grossly normal Respiratory: normal respiratory effort. Cardiovascular: quick capillary refill, normal S1/S2, RRR, no JVD, murmurs Gastrointestinal: soft, NT, ND Nervous: A&O x3. no gross focal neurologic deficits, normal speech Extremities: moves all equally, no edema, normal tone Skin: dry, intact, normal temperature, normal color. No rashes, lesions or ulcers on exposed skin Psychiatry: normal mood, congruent  affect  Labs   I have personally reviewed the following labs and imaging studies CBC    Component Value Date/Time   WBC 3.8 (L) 03/06/2023 0418   RBC 2.69 (L) 03/06/2023 0418   HGB 8.6 (L) 03/06/2023 0418   HGB 15.5 10/06/2012 1710   HCT 27.4 (L) 03/06/2023 0418   HCT 46.7 10/06/2012 1710   PLT 81 (L) 03/06/2023 0418   PLT 233 10/06/2012 1710   MCV 101.9 (H) 03/06/2023 0418   MCV 89 10/06/2012 1710   MCH 32.0 03/06/2023 0418   MCHC 31.4 03/06/2023 0418   RDW 14.4 03/06/2023 0418   RDW 13.5 10/06/2012 1710   LYMPHSABS 1.5 09/09/2022 0554   MONOABS 0.4 09/09/2022 0554   EOSABS 0.1 09/09/2022 0554   BASOSABS 0.0 09/09/2022 0554      Latest Ref Rng & Units 03/06/2023    4:18 AM 03/05/2023    6:42 AM 03/04/2023    1:18 PM  BMP  Glucose 70 - 99 mg/dL 161  096  045   BUN 8 - 23 mg/dL 53  81  409   Creatinine 0.61 - 1.24 mg/dL 8.11  9.14  7.82  Sodium 135 - 145 mmol/L 145  147  144   Potassium 3.5 - 5.1 mmol/L 4.3  3.7  4.2   Chloride 98 - 111 mmol/L 123  125  118   CO2 22 - 32 mmol/L 15  15  12    Calcium 8.9 - 10.3 mg/dL 8.8  8.6  8.7     US RENAL  Result Date: 03/05/2023 CLINICAL DATA:  Acute kidney injury EXAM: RENAL / URINARY TRACT ULTRASOUND COMPLETE COMPARISON:  09/02/2022 FINDINGS: Right Kidney: Renal measurements: 11.7 x 5.8 x 4.7 cm = volume: 166 mL. Echogenicity within normal limits. No mass or hydronephrosis visualized. Multiple simple cysts are present measuring up to 8.1 cm which do not require dedicated follow-up. Left Kidney: Renal measurements: 5.7 x 6.4 x 5.1 cm = volume: 220 mL. Echogenicity within normal limits. No mass or hydronephrosis visualized. 3.9 cm simple cyst does not require further imaging follow-up. Bladder: Appears normal for degree of bladder distention. Other: None. IMPRESSION: No significant sonographic abnormality of the kidneys. Electronically Signed   By: Acquanetta Belling M.D.   On: 03/05/2023 08:02   CT Head Wo Contrast  Result Date:  03/04/2023 CLINICAL DATA:  Mental status change, unknown cause. EXAM: CT HEAD WITHOUT CONTRAST TECHNIQUE: Contiguous axial images were obtained from the base of the skull through the vertex without intravenous contrast. RADIATION DOSE REDUCTION: This exam was performed according to the departmental dose-optimization program which includes automated exposure control, adjustment of the mA and/or kV according to patient size and/or use of iterative reconstruction technique. COMPARISON:  Head CT 09/01/2022. FINDINGS: Brain: Stable size and configuration of the shunted ventricles with unchanged right posterior approach ventricular shunt catheter with tip terminating in the anterior body of the right lateral ventricle. Small bilateral subdural collections, measuring up to 5 mm in thickness on the left and 6 mm in thickness on the right, suggestive of overshunting. Unchanged encephalomalacia in the bilateral frontal lobes. Vascular: No hyperdense vessel or unexpected calcification. Skull: Old right frontal burr hole. Sinuses/Orbits: Unremarkable. Other: None. IMPRESSION: 1. Stable size and configuration of the shunted ventricles with unchanged right posterior approach ventricular shunt catheter with tip terminating in the anterior body of the right lateral ventricle. 2. Small bilateral subdural collections, measuring up to 5 mm in thickness on the left and 6 mm in thickness on the right, suggestive of overshunting. Electronically Signed   By: Orvan Falconer M.D.   On: 03/04/2023 14:02    Disposition Plan & Communication  Patient status: Inpatient  Admitted From: Home Planned disposition location: Skilled nursing facility Anticipated discharge date: 6/6 pending SNF placement  Family Communication: none at bedside    Author: Harold Hedge, DO [facility] 03/06/2023, 11:51 AM   Available by Epic secure chat 7AM-7PM. If 7PM-7AM, please contact night-coverage.  [facility] contact information found on  [email or web address].

## 2023-03-06 NOTE — [place] FL2 (Signed)
[place] MEDICAID FL2 LEVEL OF CARE FORM     IDENTIFICATION  Patient Name: Albert Figueroa Birthdate: October 07, 1955 Sex: male Admission Date (Current Location): 03/04/2023  [facility] and [place] Number:  Chiropodist and Address:         Provider Number: [id number]  Attending Physician Name and Address:  Harold Hedge, MD  Relative Name and Phone Number:       Current Level of Care: Hospital Recommended Level of Care: Skilled Nursing Facility Prior Approval Number:    Date Approved/Denied:   PASRR Number: [id number]  Discharge Plan: SNF    Current Diagnoses: Patient Active Problem List   Diagnosis Date Noted   AKI (acute kidney injury) (HCC) 03/05/2023   Dehydration 03/05/2023   Acute renal failure superimposed on stage 4 chronic kidney disease (HCC) 03/04/2023   Seizure (HCC) 03/04/2023   Pancytopenia (HCC) 09/06/2022   Hypoglycemia 09/03/2022   Acute metabolic encephalopathy 09/02/2022   Blepharitis of right lower eyelid 09/02/2022   Dyslipidemia 09/02/2022   Pulmonary hypertension (HCC) 09/02/2022   Depression with anxiety 09/02/2022   GERD without esophagitis 09/02/2022   Metabolic acidosis 09/02/2022   Intractable pain 07/05/2022   Type II diabetes mellitus with renal manifestations (HCC) 07/05/2022   Over weight 07/05/2022   Thrombocytopenia (HCC) 07/05/2022   Fall at home, initial encounter 07/05/2022   Cellulitis of right hand 07/05/2022   COPD (chronic obstructive pulmonary disease) (HCC) 07/05/2022   Iron deficiency anemia 07/05/2022   Toxic encephalopathy 07/05/2022   Neuropathy 07/05/2022   Right wrist pain    Chronic pain syndrome 01/01/2021   Pharmacologic therapy 01/01/2021   Disorder of skeletal system 01/01/2021   Problems influencing health status 01/01/2021   Aortic atherosclerosis (HCC) 02/24/2020   Other emphysema (HCC) 02/24/2020   Medicare annual wellness visit, initial 02/03/2020   Neck pain 09/15/2018   Sacroiliac joint  pain 09/08/2018   Lumbar spondylosis 06/11/2018   Left hip pain 06/11/2018   Chronic pain of both knees 06/11/2018   CKD (chronic kidney disease) stage 4, GFR 15-29 ml/min (HCC) 10/29/2017   Obesity (BMI 30-39.9) 11/27/2015   Tobacco use 06/13/2015   Essential hypertension 06/13/2015   Mixed hyperlipidemia 06/13/2015   Osteoarthritis 06/13/2015   Biceps tendinitis 04/06/2015   Epilepsy without status epilepticus, not intractable (HCC) 12/10/2013    Orientation RESPIRATION BLADDER Height & Weight     Self, Time, Situation, Place  Normal Incontinent Weight: 90.9 kg Height:  5\' 11"  (180.3 cm)  BEHAVIORAL SYMPTOMS/MOOD NEUROLOGICAL BOWEL NUTRITION STATUS      Continent Diet (Heart Healthy carb modified)  AMBULATORY STATUS COMMUNICATION OF NEEDS Skin   Limited Assist Verbally Skin abrasions                       Personal Care Assistance Level of Assistance              Functional Limitations Info             SPECIAL CARE FACTORS FREQUENCY  PT (By licensed PT), OT (By licensed OT)                    Contractures Contractures Info: Not present    Additional Factors Info  Code Status, Allergies Code Status Info: Full Allergies Info: Gabapentin, Amlodipine           Current Medications (03/06/2023):  This is the current hospital active medication list Current Facility-Administered Medications  Medication Dose Route Frequency  Provider Last Rate Last Admin   acetaminophen (TYLENOL) tablet 650 mg  650 mg Oral Q6H PRN Lorretta Harp, MD       albuterol (PROVENTIL) (2.5 MG/3ML) 0.083% nebulizer solution 3 mL  3 mL Inhalation Q4H PRN Lorretta Harp, MD       dextromethorphan-guaiFENesin (MUCINEX DM) 30-600 MG per 12 hr tablet 1 tablet  1 tablet Oral BID PRN Lorretta Harp, MD       divalproex (DEPAKOTE ER) 24 hr tablet 1,000 mg  1,000 mg Oral BID Lorretta Harp, MD   1,000 mg at 03/06/23 1005   enoxaparin (LOVENOX) injection 40 mg  40 mg Subcutaneous QHS [place], Elsie Ra, MD        fenofibrate tablet 160 mg  160 mg Oral Daily Lorretta Harp, MD   160 mg at 03/06/23 1005   insulin aspart (novoLOG) injection 0-9 Units  0-9 Units Subcutaneous TID WC Lorretta Harp, MD       lactated ringers infusion   Intravenous Continuous Leeroy Bock, MD 50 mL/hr at 03/06/23 1019 New Bag at 03/06/23 1019   LORazepam (ATIVAN) injection 2 mg  2 mg Intravenous Q2H PRN Lorretta Harp, MD       morphine (PF) 2 MG/ML injection 2 mg  2 mg Intravenous Q4H PRN Mansy, Jan A, MD   2 mg at 03/05/23 2117   nicotine (NICODERM CQ - dosed in mg/24 hours) patch 21 mg  21 mg Transdermal Daily Lorretta Harp, MD   21 mg at 03/05/23 1400   ondansetron (ZOFRAN) injection 4 mg  4 mg Intravenous Q8H PRN Lorretta Harp, MD       oxyCODONE (Oxy IR/ROXICODONE) immediate release tablet 20 mg  20 mg Oral Q8H PRN Lorretta Harp, MD   20 mg at 03/06/23 1133   pantoprazole (PROTONIX) EC tablet 40 mg  40 mg Oral QAC breakfast Lorretta Harp, MD   40 mg at 03/06/23 1005   rosuvastatin (CRESTOR) tablet 20 mg  20 mg Oral QHS Lorretta Harp, MD   20 mg at 03/05/23 2119     Discharge Medications: Please see discharge summary for a list of discharge medications.  Relevant Imaging Results:  Relevant Lab Results:   Additional Information SS #: [id number]  Chapman Fitch, RN

## 2023-03-06 NOTE — Evaluation (Signed)
Occupational Therapy Evaluation Patient Details Name: Albert Figueroa MRN: [id number] DOB: 28-Dec-1955 Today's Date: 03/06/2023   History of Present Illness Albert Figueroa is a 67yoM who comes to [facility] after acute onset weakness, AMS. Pt dehydrated. Brain imaging negative for CVA. 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   Clinical Impression   Albert Figueroa was seen for OT evaluation this date. Prior to hospital admission, pt was generally independent with BADL management. He endorses "I do things my own way" using a RW for functional mobility and endorses multiple falls in the last 6 months. Pt presents to acute OT demonstrating impaired ADL performance and functional mobility 2/2 generalized weakness, decreased activity tolerance, and decreased safety awareness. (See OT problem list for additional functional deficits). Pt currently requires close min guard for toilet transfer/toileting, supervision for LB dressing while sitting in recliner.  Pt would benefit from skilled OT services to address noted impairments and functional limitations (see below for any additional details) in order to maximize safety and independence while minimizing falls risk and caregiver burden. Anticipate the need for follow up OT services upon acute hospital DC.       Recommendations for follow up therapy are one component of a multi-disciplinary discharge planning process, led by the attending physician.  Recommendations may be updated based on patient status, additional functional criteria and insurance authorization.   Assistance Recommended at Discharge Intermittent Supervision/Assistance  Patient can return home with the following A little help with walking and/or transfers;A little help with bathing/dressing/bathroom;Help with stairs or ramp for  entrance;Assistance with cooking/housework;Assist for transportation;Direct supervision/assist for medications management;Direct supervision/assist for financial management    Functional Status Assessment  Patient has had a recent decline in their functional status and demonstrates the ability to make significant improvements in function in a reasonable and predictable amount of time.  Equipment Recommendations  BSC/3in1    Recommendations for Other Services       Precautions / Restrictions Precautions Precautions: Fall Restrictions Weight Bearing Restrictions: No      Mobility Bed Mobility Overal bed mobility: Needs Assistance             General bed mobility comments: sitting EOB on arrival and left in recliner at end of session    Transfers Overall transfer level: Needs assistance Equipment used: Rolling walker (2 wheels) Transfers: Sit to/from Stand Sit to Stand: Min assist, Min guard           General transfer comment: assist to stand from low surface but able to stand from recliner with min guard.      Balance Overall balance assessment: Needs assistance Sitting-balance support: No upper extremity supported, Feet supported Sitting balance-Leahy Scale: Good Sitting balance - Comments: steady with dynamic sitting balance at EOB.   Standing balance support: Bilateral upper extremity supported, Reliant on assistive device for balance, Single extremity supported, During functional activity Standing balance-Leahy Scale: Fair Standing balance comment: Requires UE support on RW during functional activity                           ADL either performed or assessed with clinical judgement   ADL Overall ADL's : Needs assistance/impaired                 Upper Body Dressing : Sitting;Set up;Supervision/safety   Lower Body  Dressing: Supervision/safety;Set up Lower Body Dressing Details (indicate cue type and reason): Able to doff/don bilat  hospital socks while sitting in recliner with SET UP assist. Increased tim/effort to perform. Toilet Transfer: Set up;Min Sales promotion account executive and Hygiene: Min guard;Sit to/from stand Toileting - Clothing Manipulation Details (indicate cue type and reason): Pt noted with difficulty with aiming during standing urination at toilet. Initially able to urinate into toilet but then begins to leak onto floor. Decreased insight into safety and ability to reposition to improve aim.     Functional mobility during ADLs: Supervision/safety;Min guard;Rolling walker (2 wheels)       Vision         Perception     Praxis      Pertinent Vitals/Pain Pain Assessment Pain Assessment: No/denies pain     Hand Dominance     Extremity/Trunk Assessment Upper Extremity Assessment Upper Extremity Assessment: Generalized weakness   Lower Extremity Assessment Lower Extremity Assessment: Generalized weakness       Communication Communication Communication: No difficulties   Cognition Arousal/Alertness: Awake/alert Behavior During Therapy: WFL for tasks assessed/performed Overall Cognitive Status: History of cognitive impairments - at baseline                                       General Comments       Exercises Other Exercises Other Exercises: Pt educated on role of OT in acute setting, safety, falls prevention, and routines modifications to support safety and functional independence during ADL management.   Shoulder Instructions      Home Living Family/patient expects to be discharged to:: Private residence Living Arrangements: Alone Available Help at Discharge: Family;Available PRN/intermittently;Friend(s) (Pt endorses plans to DC home with family. Per chart, may have recently been evicted from apartment, however, pt states he will go home to live with his sister.)   Home Access: Stairs to enter           Bathroom Shower/Tub:  Chief Strategy Officer: Standard Bathroom Accessibility: No   Home Equipment: Agricultural consultant (2 wheels);Cane - single point;Shower seat          Prior Functioning/Environment Prior Level of Function : Independent/Modified Independent             Mobility Comments: reports furniture walking, has a RW to use ADLs Comments: MOD I for ADL, assist with IADL for grocery shopping, cooking        OT Problem List: Decreased activity tolerance;Decreased safety awareness;Impaired balance (sitting and/or standing);Decreased knowledge of use of DME or AE;Decreased coordination;Decreased cognition      OT Treatment/Interventions: Self-care/ADL training;Therapeutic exercise;Therapeutic activities;Balance training;DME and/or AE instruction;Patient/family education    OT Goals(Current goals can be found in the care plan section) Acute Rehab OT Goals Patient Stated Goal: to go home OT Goal Formulation: With patient Time For Goal Achievement: 03/20/23 Potential to Achieve Goals: Good ADL Goals Pt Will Perform Lower Body Dressing: sit to/from stand;with set-up;with supervision;with adaptive equipment Pt Will Transfer to Toilet: regular height toilet;ambulating;with set-up;with supervision Pt Will Perform Toileting - Clothing Manipulation and hygiene: sit to/from stand;with set-up;with supervision  OT Frequency: Min 2X/week    Co-evaluation PT/OT/SLP Co-Evaluation/Treatment: Yes Reason for Co-Treatment: Necessary to address cognition/behavior during functional activity;For patient/therapist safety;To address functional/ADL transfers PT goals addressed during session: Mobility/safety with mobility;Balance;Proper use of DME OT goals addressed during session: ADL's and self-care;Proper use of  Adaptive equipment and DME      AM-PAC OT "6 Clicks" Daily Activity     Outcome Measure Help from another person eating meals?: None Help from another person taking care of personal  grooming?: A Little Help from another person toileting, which includes using toliet, bedpan, or urinal?: A Little Help from another person bathing (including washing, rinsing, drying)?: A Little Help from another person to put on and taking off regular upper body clothing?: A Little Help from another person to put on and taking off regular lower body clothing?: A Little 6 Click Score: 19   End of Session Equipment Utilized During Treatment: Gait belt;Rolling walker (2 wheels)  Activity Tolerance: Patient tolerated treatment well Patient left: in chair;with call bell/phone within reach;with chair alarm set  OT Visit Diagnosis: Other abnormalities of gait and mobility (R26.89);Muscle weakness (generalized) (M62.81)                Time: 0981-1914 OT Time Calculation (min): 27 min Charges:  OT General Charges $OT Visit: 1 Visit OT Evaluation $OT Eval Moderate Complexity: 1 Mod  Rockney Ghee, M.S., OTR/L 03/06/23, 12:07 PM

## 2023-03-06 NOTE — TOC Progression Note (Addendum)
Transition of Care [facility]) - Progression Note    Patient Details  Name: Albert Figueroa MRN: [id number] Date of Birth: [birth year]  Transition of Care [facility]) CM/SW Contact  Chapman Fitch, RN Phone Number: 03/06/2023, 3:59 PM  Clinical Narrative:     Admitted for: ARF Admitted from: Patient states he was previously living in a 1 bedroom apartment that is paid through the 6/8, but was asked by his landlord not to return PCP: L[place]ong   Therapy recommending SNF.  Patient asked that we call is sister [name] on speaker phone while in the room [phone].  Patient wishes to go to sisters house at discharge [address]  Sister states that he can come there, but has to go to SNF first.  Patient in agreement but states that he is only interested in [facility].   Existing PASRR  Fl2 sent for signature  Gina at [facility] confirms they can admit patient, however due to bed availability he would have to admit in the morning, or on Monday.  Message sent to MD to determine when patient will medically be ready as he will require insurance auth  Received call from daughter [name] requesting an update and she states she does not feel like patient is able to live on his own.  Patient gave this RNCm permission to speak with daughter, returned call however her VM was not set up   Update: per MD patient medically readiness for dc will depend on creatine level  Expected Discharge Plan: Skilled Nursing Facility Barriers to Discharge: Continued Medical Work up  Expected Discharge Plan and Services     Post Acute Care Choice: Skilled Nursing Facility Living arrangements for the past 2 months: Apartment                                       Social Determinants of Health (SDOH) Interventions SDOH Screenings   Food Insecurity: No Food Insecurity (03/04/2023)  Housing: Low Risk  (03/04/2023)  Transportation Needs: No Transportation Needs (03/04/2023)  Utilities: Not At Risk  (03/04/2023)  Tobacco Use: High Risk (03/04/2023)    Readmission Risk Interventions    03/06/2023    3:23 PM  Readmission Risk Prevention Plan  Transportation Screening Complete  PCP or Specialist Appt within 3-5 Days Complete  Social Work Consult for Recovery Care Planning/Counseling Complete  Palliative Care Screening Not Applicable  Medication Review Oceanographer) Complete

## 2023-03-06 NOTE — Progress Notes (Signed)
Physical Therapy Treatment Patient Details Name: Albert Figueroa MRN: [id number] DOB: 1956/01/05 Today's Date: 03/06/2023   History of Present Illness Masai Paganini is a 67yoM who comes to [facility] after acute onset weakness, AMS. Pt dehydrated. Brain imaging negative for CVA. 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    PT Comments    Patient progressing towards physical therapy goals. Patient sitting EOB on arrival and agreeable to therapy session. Able to stand from low bed surface with minA and from recliner with min guard. Ambulated to bathroom and within room with min guard and RW, cues required to maintain close RW proximity. Discharge plan remains appropriate.     Recommendations for follow up therapy are one component of a multi-disciplinary discharge planning process, led by the attending physician.  Recommendations may be updated based on patient status, additional functional criteria and insurance authorization.  Follow Up Recommendations  Can patient physically be transported by private vehicle: No    Assistance Recommended at Discharge Frequent or constant Supervision/Assistance  Patient can return home with the following A little help with walking and/or transfers;A little help with bathing/dressing/bathroom;Assistance with cooking/housework;Help with stairs or ramp for entrance;Assist for transportation   Equipment Recommendations  None recommended by PT    Recommendations for Other Services       Precautions / Restrictions Precautions Precautions: Fall Restrictions Weight Bearing Restrictions: No     Mobility  Bed Mobility               General bed mobility comments: sitting EOB on arrival and left in recliner at end of session    Transfers Overall transfer level: Needs  assistance Equipment used: Rolling Bodi Palmeri (2 wheels) Transfers: Sit to/from Stand Sit to Stand: Min assist, Min guard           General transfer comment: assist to stand from low surface but able to stand from recliner with min guard.    Ambulation/Gait Ambulation/Gait assistance: Min guard Gait Distance (Feet): 20 Feet (+35') Assistive device: Rolling Yahel Fuston (2 wheels) Gait Pattern/deviations: Step-through pattern, Decreased stride length Gait velocity: decreased     General Gait Details: min guard for safety. Ambulated to bathroom and within room with no LOB noted. Cues for maintaining close proximity to RW during amulation especially turns   Social research officer, government Rankin (Stroke Patients Only)       Balance Overall balance assessment: Needs assistance Sitting-balance support: No upper extremity supported, Feet supported Sitting balance-Leahy Scale: Good     Standing balance support: Bilateral upper extremity supported, Reliant on assistive device for balance Standing balance-Leahy Scale: Fair                              Cognition Arousal/Alertness: Awake/alert Behavior During Therapy: WFL for tasks assessed/performed Overall Cognitive Status: History of cognitive impairments - at baseline                                          Exercises      General Comments        Pertinent Vitals/Pain Pain Assessment Pain Assessment: No/denies pain  Home Living                          Prior Function            PT Goals (current goals can now be found in the care plan section) Acute Rehab PT Goals PT Goal Formulation: With patient Time For Goal Achievement: 03/19/23 Potential to Achieve Goals: Fair Progress towards PT goals: Progressing toward goals    Frequency    Min 3X/week      PT Plan Current plan remains appropriate    Co-evaluation              AM-PAC  PT "6 Clicks" Mobility   Outcome Measure  Help needed turning from your back to your side while in a flat bed without using bedrails?: A Little Help needed moving from lying on your back to sitting on the side of a flat bed without using bedrails?: A Little Help needed moving to and from a bed to a chair (including a wheelchair)?: A Little Help needed standing up from a chair using your arms (e.g., wheelchair or bedside chair)?: A Little Help needed to walk in hospital room?: A Little Help needed climbing 3-5 steps with a railing? : A Lot 6 Click Score: 17    End of Session Equipment Utilized During Treatment: Gait belt Activity Tolerance: Patient tolerated treatment well Patient left: in chair;with call bell/phone within reach;with chair alarm set Nurse Communication: Mobility status PT Visit Diagnosis: Difficulty in walking, not elsewhere classified (R26.2);Other abnormalities of gait and mobility (R26.89);Repeated falls (R29.6);Muscle weakness (generalized) (M62.81)     Time: 9147-8295 PT Time Calculation (min) (ACUTE ONLY): 27 min  Charges:  $Gait Training: 8-22 mins                     [name], PT, DPT Physical Therapist - [facility]  [facility]    Zakai Gonyea A Fouad Taul 03/06/2023, 10:28 AM

## 2023-03-07 DIAGNOSIS — N178 Other acute kidney failure: Secondary | ICD-10-CM | POA: Diagnosis not present

## 2023-03-07 DIAGNOSIS — N184 Chronic kidney disease, stage 4 (severe): Secondary | ICD-10-CM | POA: Diagnosis not present

## 2023-03-07 LAB — COMPREHENSIVE METABOLIC PANEL
ALT: 16 U/L (ref 0–44)
AST: 31 U/L (ref 15–41)
Albumin: 2.5 g/dL — ABNORMAL LOW (ref 3.5–5.0)
Alkaline Phosphatase: 67 U/L (ref 38–126)
Anion gap: 4 — ABNORMAL LOW (ref 5–15)
BUN: 39 mg/dL — ABNORMAL HIGH (ref 8–23)
CO2: 20 mmol/L — ABNORMAL LOW (ref 22–32)
Calcium: 8.9 mg/dL (ref 8.9–10.3)
Chloride: 124 mmol/L — ABNORMAL HIGH (ref 98–111)
Creatinine, Ser: 1.81 mg/dL — ABNORMAL HIGH (ref 0.61–1.24)
GFR, Estimated: 41 mL/min — ABNORMAL LOW (ref >60)
Glucose, Bld: 118 mg/dL — ABNORMAL HIGH (ref 70–99)
Potassium: 4.1 mmol/L (ref 3.5–5.1)
Sodium: 148 mmol/L — ABNORMAL HIGH (ref 135–145)
Total Bilirubin: 1 mg/dL (ref 0.3–1.2)
Total Protein: 6.2 g/dL — ABNORMAL LOW (ref 6.5–8.1)

## 2023-03-07 LAB — CBC WITH DIFFERENTIAL/PLATELET
Abs Immature Granulocytes: 0.01 10*3/uL (ref 0.00–0.07)
Basophils Absolute: 0 10*3/uL (ref 0.0–0.1)
Basophils Relative: 1 %
Eosinophils Absolute: 0.1 10*3/uL (ref 0.0–0.5)
Eosinophils Relative: 4 %
HCT: 29.2 % — ABNORMAL LOW (ref 39.0–52.0)
Hemoglobin: 9.2 g/dL — ABNORMAL LOW (ref 13.0–17.0)
Immature Granulocytes: 0 %
Lymphocytes Relative: 39 %
Lymphs Abs: 1.1 10*3/uL (ref 0.7–4.0)
MCH: 31.8 pg (ref 26.0–34.0)
MCHC: 31.5 g/dL (ref 30.0–36.0)
MCV: 101 fL — ABNORMAL HIGH (ref 80.0–100.0)
Monocytes Absolute: 0.3 10*3/uL (ref 0.1–1.0)
Monocytes Relative: 12 %
Neutro Abs: 1.2 10*3/uL — ABNORMAL LOW (ref 1.7–7.7)
Neutrophils Relative %: 44 %
Platelets: 86 10*3/uL — ABNORMAL LOW (ref 150–400)
RBC: 2.89 MIL/uL — ABNORMAL LOW (ref 4.22–5.81)
RDW: 14.5 % (ref 11.5–15.5)
WBC: 2.7 10*3/uL — ABNORMAL LOW (ref 4.0–10.5)
nRBC: 0 % (ref 0.0–0.2)

## 2023-03-07 LAB — GLUCOSE, CAPILLARY
Glucose-Capillary: 81 mg/dL (ref 70–99)
Glucose-Capillary: 109 mg/dL — ABNORMAL HIGH (ref 70–99)
Glucose-Capillary: 97 mg/dL (ref 70–99)
Glucose-Capillary: 94 mg/dL (ref 70–99)

## 2023-03-07 LAB — TECHNOLOGIST SMEAR REVIEW: Plt Morphology: NORMAL

## 2023-03-07 MED ORDER — DEXTROSE 5 % IV SOLN: INTRAVENOUS | Status: DC

## 2023-03-07 NOTE — Progress Notes (Signed)
PROGRESS NOTE  Albert Figueroa    DOB: 1956-05-26, 67 y.o.  ZOX:[id number]    Code Status: Full Code   DOA: 03/04/2023   LOS: 2   Brief hospital course  Albert Figueroa is a 67 y.o. 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  They presented from home to the ED on 03/04/2023 with AMS x 1 days. Per admitting physician, he was confused but also oriented x3 on presentation.   In the ED, it was found that they had completely normal vital signs.  Significant findings included WBC 5.5, AKI with creatinine 3.61, BUN 110, GFR 18 (recent baseline creatinine 1.9 on 01/27/2023). CT of head: 1. Stable size and configuration of the shunted ventricles with unchanged right posterior approach ventricular shunt catheter with tip terminating in the anterior body of the right lateral ventricle. 2. Small bilateral subdural collections, measuring up to 5 mm in thickness on the left and 6 mm in thickness on the right, suggestive of overshunting.   EKG: Sinus rhythm, QTc 430, low voltage, Q-wave in lead III  They were initially treated with IV fluids.   Patient was admitted to medicine service for further workup and management of AKI and AMS as outlined in detail below.  03/05/23 -stable mental status. Oriented x3. He states that he is going to leave AMA due to financial and housing constraints. He endorses that he does not like water so doesn't drink it so that is why is is always dehydrated.  Assessment & Plan  Principal Problem:   Acute renal failure superimposed on stage 4 chronic kidney disease (HCC) Active Problems:   Acute metabolic encephalopathy   Chronic pain syndrome   Essential hypertension   Dyslipidemia   Thrombocytopenia (HCC)   Type II diabetes mellitus with renal manifestations (HCC)   Iron deficiency anemia    COPD (chronic obstructive pulmonary disease) (HCC)   Depression with anxiety   Seizure (HCC)   Tobacco use   AKI (acute kidney injury) (HCC)   Dehydration  AKI with metabolic acidosis. Likely due to dehydration and continuation of Lasix and lisinopril. Cr improved 3.61>1.81but still above baseline around 1.5. bicarb improved today to 20 from 12 on arrival Renal US unremarkable.  -Hold Lasix and lisinopril - continue IVF, encourage PO hydration, patient reports that he still does not like to drink water and gave him other alternatives such as Gatorade, fresh juices, he said he will try today. - follow UOP  Hypernatremia Poor po as above, aKI as well - encouraging po - switch fluids to d5   Acute metabolic encephalopathy: per report, must have been mild since he was oriented x3 on presentation. He is fully oriented today. Head CT was non-acute and showed ventricular shunt in place. Radiology also read "overshunting". Prior provider discussed this with neurosurgeon on-call, Dr. Katrinka Blazing, who reviewed the images and stated that it was a chronic change of over drainage and does not require revision if he has no symptoms.  Concern for several sedating medications at home which will need review by PCP and reconciliation on dc.  AKI with metabolic acidosis likely also contributed to mental status -Frequent neurocheck -Fall precaution -Hold sedative medications: Flexeril, hydroxyzine, tizanidine - PT/OT evaluated and recommended SNF. TOC engaged   Chronic pain syndrome -Continue home as needed oxycodone -As needed Tylenol   Essential hypertension: Blood  pressure well controlled -Hold Lasix and lisinopril due to worsening renal function. Restart as needed and tolerating with renal function   Dyslipidemia -Crestor and fenofibrate   Pancytopenia (HCC): This is chronic issue.  Cell counts stable - advise heme referral at discharge - will check smear   Type II diabetes mellitus with renal  manifestations [facility]): Patient is taking [place].  Recent A1c 4. 6, well-controlled. -no insulin needed. Monitor on labs   Iron deficiency anemia: Hemoglobin stable, 10.4.  Patient's not taking iron supplement currently -Follow-up with CBC   COPD (chronic obstructive pulmonary disease) (HCC): Stable -As needed albuterol   Depression with anxiety -continue home cymbalta   Seizure (HCC) -Seizure precaution -Continue home Depakote - check valproic acid level   Tobacco use- 96 pack year history -Nicotine patch  Body mass index is 27.95 kg/m.  VTE ppx: enoxaparin (LOVENOX) injection 40 mg Start: 03/06/23 2200 SCDs Start: 03/04/23 1501   Diet:     Diet   Diet heart healthy/carb modified Room service appropriate? Yes; Fluid consistency: Thin   Consultants: Discussed imaging with neurosurgery   Subjective 03/07/23    Patient was seen and examined at bedside today.  Pt reports no complaints. Tolerating diet   Objective   Vitals:   03/06/23 0743 03/06/23 2010 03/07/23 0345 03/07/23 0749  BP: 118/63 123/72 (!) 141/71 94/62  Pulse: 90 70 69 78  Resp: 20 18 18 16   Temp: 97.9 F (36.6 C) (!) 97.5 F (36.4 C) (!) 97.3 F (36.3 C) 98.2 F (36.8 C)  TempSrc: Oral Oral Oral   SpO2: 95% 100% 99% 100%  Weight:      Height:        Intake/Output Summary (Last 24 hours) at 03/07/2023 1424 Last data filed at 03/07/2023 1027 Gross per 24 hour  Intake 1259.91 ml  Output 550 ml  Net 709.91 ml   Filed Weights   03/04/23 1615  Weight: 90.9 kg     Physical Exam:  General: awake, alert, NAD HEENT: atraumatic, clear conjunctiva, anicteric sclera, MMM, hearing grossly normal Respiratory: normal respiratory effort. Cardiovascular: quick capillary refill, normal S1/S2, RRR, no JVD, murmurs Gastrointestinal: soft, NT, ND Nervous: A&O x3. no gross focal neurologic deficits, normal speech Extremities: moves all equally, no edema, normal tone Skin: dry, intact, normal temperature,  normal color. No rashes, lesions or ulcers on exposed skin Psychiatry: normal mood, congruent affect  Labs   I have personally reviewed the following labs and imaging studies CBC    Component Value Date/Time   WBC 2.7 (L) 03/07/2023 0436   RBC 2.89 (L) 03/07/2023 0436   HGB 9.2 (L) 03/07/2023 0436   HGB 15.5 10/06/2012 1710   HCT 29.2 (L) 03/07/2023 0436   HCT 46.7 10/06/2012 1710   PLT 86 (L) 03/07/2023 0436   PLT 233 10/06/2012 1710   MCV 101.0 (H) 03/07/2023 0436   MCV 89 10/06/2012 1710   MCH 31.8 03/07/2023 0436   MCHC 31.5 03/07/2023 0436   RDW 14.5 03/07/2023 0436   RDW 13.5 10/06/2012 1710   LYMPHSABS 1.1 03/07/2023 0436   MONOABS 0.3 03/07/2023 0436   EOSABS 0.1 03/07/2023 0436   BASOSABS 0.0 03/07/2023 0436      Latest Ref Rng & Units 03/07/2023    4:36 AM 03/06/2023    4:18 AM 03/05/2023    6:42 AM  BMP  Glucose 70 - 99 mg/dL 161  096  045   BUN 8 - 23 mg/dL 39  53  81  Creatinine 0.61 - 1.24 mg/dL 1.61  0.96  0.45   Sodium 135 - 145 mmol/L 148  145  147   Potassium 3.5 - 5.1 mmol/L 4.1  4.3  3.7   Chloride 98 - 111 mmol/L 124  123  125   CO2 22 - 32 mmol/L 20  15  15    Calcium 8.9 - 10.3 mg/dL 8.9  8.8  8.6     No results found.  Disposition Plan & Communication  Patient status: Inpatient  Admitted From: Home Planned disposition location: Skilled nursing facility Anticipated discharge date: [facility] says bed will be available 6/10  Family Communication: none at bedside    Author: Silvano Bilis, MD [facility] 03/07/2023, 2:24 PM   Available by Epic secure chat 7AM-7PM. If 7PM-7AM, please contact night-coverage.  [facility] contact information found on [email or web address].

## 2023-03-07 NOTE — Progress Notes (Signed)
Physical Therapy Treatment Patient Details Name: Albert Figueroa MRN: [id number] DOB: Dec 02, 1955 Today's Date: 03/07/2023   History of Present Illness Albert Figueroa is a 67yoM who comes to [facility] after acute onset weakness, AMS. Pt dehydrated. Brain imaging negative for CVA. 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    PT Comments    Patient supine in bed on arrival and agreeable to therapy session. Reporting increased stiffness in R knee. Required min guard for sit to stand from low surface. Ambulated 5' with min guard and RW, cues required for maintaining close RW proximity and upright posture. Discharge plan remains appropriate.    Recommendations for follow up therapy are one component of a multi-disciplinary discharge planning process, led by the attending physician.  Recommendations may be updated based on patient status, additional functional criteria and insurance authorization.  Follow Up Recommendations  Can patient physically be transported by private vehicle: Yes    Assistance Recommended at Discharge Frequent or constant Supervision/Assistance  Patient can return home with the following A little help with walking and/or transfers;A little help with bathing/dressing/bathroom;Assistance with cooking/housework;Help with stairs or ramp for entrance;Assist for transportation   Equipment Recommendations  None recommended by PT    Recommendations for Other Services       Precautions / Restrictions Precautions Precautions: Fall Restrictions Weight Bearing Restrictions: No     Mobility  Bed Mobility Overal bed mobility: Needs Assistance Bed Mobility: Supine to Sit     Supine to sit: Min guard, HOB elevated          Transfers Overall transfer level: Needs assistance Equipment used: Rolling  [name] (2 wheels) Transfers: Sit to/from Stand Sit to Stand: Min guard                Ambulation/Gait Ambulation/Gait assistance: Min guard Gait Distance (Feet): 80 Feet Assistive device: Rolling [name] (2 wheels) Gait Pattern/deviations: Step-through pattern, Decreased stride length Gait velocity: decreased     General Gait Details: min guard for safety. Slow gait speed 2/2 R knee stiffness   Stairs             Wheelchair Mobility    Modified Rankin (Stroke Patients Only)       Balance Overall balance assessment: Needs assistance Sitting-balance support: No upper extremity supported, Feet supported Sitting balance-Leahy Scale: Good     Standing balance support: Bilateral upper extremity supported, Reliant on assistive device for balance, Single extremity supported, During functional activity Standing balance-Leahy Scale: Fair                              Cognition Arousal/Alertness: Awake/alert Behavior During Therapy: WFL for tasks assessed/performed Overall Cognitive Status: History of cognitive impairments - at baseline                                          Exercises      General Comments        Pertinent Vitals/Pain Pain Assessment Pain Assessment: Faces Faces Pain Scale: Hurts little more Pain Location: R knee Pain Descriptors / Indicators: Grimacing, Guarding Pain Intervention(s): Monitored during session, Repositioned    Home Living  Prior Function            PT Goals (current goals can now be found in the care plan section) Acute Rehab PT Goals PT Goal Formulation: With patient Time For Goal Achievement: 03/19/23 Potential to Achieve Goals: Fair Progress towards PT goals: Progressing toward goals    Frequency    Min 3X/week      PT Plan Current plan remains appropriate    Co-evaluation              AM-PAC PT "6 Clicks" Mobility   Outcome  Measure  Help needed turning from your back to your side while in a flat bed without using bedrails?: A Little Help needed moving from lying on your back to sitting on the side of a flat bed without using bedrails?: A Little Help needed moving to and from a bed to a chair (including a wheelchair)?: A Little Help needed standing up from a chair using your arms (e.g., wheelchair or bedside chair)?: A Little Help needed to walk in hospital room?: A Little Help needed climbing 3-5 steps with a railing? : A Lot 6 Click Score: 17    End of Session Equipment Utilized During Treatment: Gait belt Activity Tolerance: Patient tolerated treatment well Patient left: in chair;with call bell/phone within reach;with chair alarm set Nurse Communication: Mobility status PT Visit Diagnosis: Difficulty in walking, not elsewhere classified (R26.2);Other abnormalities of gait and mobility (R26.89);Repeated falls (R29.6);Muscle weakness (generalized) (M62.81)     Time: 4098-1191 PT Time Calculation (min) (ACUTE ONLY): 14 min  Charges:  $Therapeutic Activity: 8-22 mins                     [name], PT, DPT Physical Therapist - [facility]  [facility]    [name] A [name] 03/07/2023, 1:04 PM

## 2023-03-07 NOTE — Care Management Important Message (Signed)
Important Message  Patient Details  Name: Albert Figueroa MRN: [id number] Date of Birth: 24-Feb-1956   Medicare Important Message Given:  Yes  Reviewed Medicare IM with [name], daughter, at [phone]. Verbal consent given for Medicare IM. Copy of Medicare IM left in room for reference.   Johnell Comings 03/07/2023, 11:52 AM

## 2023-03-07 NOTE — TOC Progression Note (Addendum)
Transition of Care [facility]) - Progression Note    Patient Details  Name: Albert Figueroa MRN: [id number] Date of Birth: 1955/11/19  Transition of Care [facility]) CM/SW Contact  Liliana Cline, LCSW Phone Number: 03/07/2023, 11:29 AM  Clinical Narrative:    Per Almira Coaster at [facility], they will not have a bed until Monday. Started auth in [place] Portal for Monday.  3:17- Berkley Harvey is pending.   Expected Discharge Plan: Skilled Nursing Facility Barriers to Discharge: Continued Medical Work up  Expected Discharge Plan and Services     Post Acute Care Choice: Skilled Nursing Facility Living arrangements for the past 2 months: Apartment                                       Social Determinants of Health (SDOH) Interventions SDOH Screenings   Food Insecurity: No Food Insecurity (03/04/2023)  Housing: Low Risk  (03/04/2023)  Transportation Needs: No Transportation Needs (03/04/2023)  Utilities: Not At Risk (03/04/2023)  Tobacco Use: High Risk (03/04/2023)    Readmission Risk Interventions    03/06/2023    3:23 PM  Readmission Risk Prevention Plan  Transportation Screening Complete  PCP or Specialist Appt within 3-5 Days Complete  Social Work Consult for Recovery Care Planning/Counseling Complete  Palliative Care Screening Not Applicable  Medication Review Oceanographer) Complete

## 2023-03-07 NOTE — Progress Notes (Signed)
Mobility Specialist - Progress Note   03/07/23 1000  Mobility  Activity Ambulated with assistance in room;Transferred from chair to bed  Level of Assistance Contact guard assist, steadying assist  Assistive Device None  Distance Ambulated (ft) 4 ft  Activity Response Tolerated well  Mobility Referral Yes  $Mobility charge 1 Mobility     Mobility responded to chair alarm. Pt standing upon arrival, attempting chair-bed holding onto tray for stability. Pt ambulated to bed with CGA. Mildly unsteady. Pt returned supine with alarm set, needs in reach.    Filiberto Pinks Mobility Specialist 03/07/23, 10:14 AM

## 2023-03-08 DIAGNOSIS — D61818 Other pancytopenia: Secondary | ICD-10-CM

## 2023-03-08 DIAGNOSIS — N184 Chronic kidney disease, stage 4 (severe): Secondary | ICD-10-CM | POA: Diagnosis not present

## 2023-03-08 DIAGNOSIS — E870 Hyperosmolality and hypernatremia: Secondary | ICD-10-CM | POA: Diagnosis not present

## 2023-03-08 DIAGNOSIS — N179 Acute kidney failure, unspecified: Secondary | ICD-10-CM | POA: Diagnosis not present

## 2023-03-08 LAB — GLUCOSE, CAPILLARY
Glucose-Capillary: 76 mg/dL (ref 70–99)
Glucose-Capillary: 95 mg/dL (ref 70–99)
Glucose-Capillary: 107 mg/dL — ABNORMAL HIGH (ref 70–99)
Glucose-Capillary: 118 mg/dL — ABNORMAL HIGH (ref 70–99)

## 2023-03-08 LAB — BASIC METABOLIC PANEL
Anion gap: 7 (ref 5–15)
BUN: 34 mg/dL — ABNORMAL HIGH (ref 8–23)
CO2: 21 mmol/L — ABNORMAL LOW (ref 22–32)
Calcium: 8.8 mg/dL — ABNORMAL LOW (ref 8.9–10.3)
Chloride: 118 mmol/L — ABNORMAL HIGH (ref 98–111)
Creatinine, Ser: 1.74 mg/dL — ABNORMAL HIGH (ref 0.61–1.24)
GFR, Estimated: 43 mL/min — ABNORMAL LOW (ref >60)
Glucose, Bld: 116 mg/dL — ABNORMAL HIGH (ref 70–99)
Potassium: 3.9 mmol/L (ref 3.5–5.1)
Sodium: 146 mmol/L — ABNORMAL HIGH (ref 135–145)

## 2023-03-08 LAB — CBC
HCT: 28.6 % — ABNORMAL LOW (ref 39.0–52.0)
Hemoglobin: 9.3 g/dL — ABNORMAL LOW (ref 13.0–17.0)
MCH: 32.7 pg (ref 26.0–34.0)
MCHC: 32.5 g/dL (ref 30.0–36.0)
MCV: 100.7 fL — ABNORMAL HIGH (ref 80.0–100.0)
Platelets: 94 10*3/uL — ABNORMAL LOW (ref 150–400)
RBC: 2.84 MIL/uL — ABNORMAL LOW (ref 4.22–5.81)
RDW: 14.4 % (ref 11.5–15.5)
WBC: 2.6 10*3/uL — ABNORMAL LOW (ref 4.0–10.5)
nRBC: 0 % (ref 0.0–0.2)

## 2023-03-08 LAB — VALPROIC ACID LEVEL: Valproic Acid Lvl: 71 ug/mL (ref 50.0–100.0)

## 2023-03-08 NOTE — Progress Notes (Signed)
PROGRESS NOTE    Albert Figueroa  [id number]:[id number] DOB: 05/31/1956 DOA: 03/04/2023 PCP: Marisue Ivan, MD   Assessment & Plan:   Principal Problem:   Acute renal failure superimposed on stage 4 chronic kidney disease (HCC) Active Problems:   Acute metabolic encephalopathy   Chronic pain syndrome   Essential hypertension   Dyslipidemia   Thrombocytopenia (HCC)   Type II diabetes mellitus with renal manifestations (HCC)   Iron deficiency anemia   COPD (chronic obstructive pulmonary disease) (HCC)   Depression with anxiety   Seizure (HCC)   Tobacco use   AKI (acute kidney injury) (HCC)   Dehydration  Assessment and Plan: AKI: possibly due to dehydration & lasix, lisinopril use. Baseline Cr around 1.5. Continue on IVFs. Holding lasix, lisinopril   Metabolic acidosis: improved slowly w/ IVFs.   Hypernatremia: free water deficit is 1.9L. Continue on D5W   Acute metabolic encephalopathy: etiology unclear. CT head  was non-acute and showed ventricular shunt in place. Radiology also read "overshunting". Prior provider discussed this with neurosurgeon on-call, Dr. Katrinka Blazing, who reviewed the images and stated that it was a chronic change of over drainage and does not require revision if he has no symptoms.    Chronic pain syndrome: continue on home dose of oxycodone    HTN: holding home dose of lasix, lisinopril    HLD: continue on statin, fenofibrate    Pancytopenia: etiology unclear, possibly medication use (depakote). Will continue to monitor    DM2: well controlled, HbA1c 4.6. Unlikely that pt needs insulin.    IDA: not taking an iron supplement. No need for a transfusion currently    COPD: w/o exacerbation. Bronchodilators prn    Depression: severity unknown. Continue on home dose of cymbalta    Hx of seizures: continue on home dose of depakote. Seizure precautions    Tobacco use: nicotine patch to prevent w/drawal. Smoking cessation counseling x 5 min        DVT  prophylaxis: lovenox  Code Status: full  Family Communication:  Disposition Plan: will d/c to SNF on 03/10/23 .  Level of care: Med-Surg  Status is: Inpatient Remains inpatient appropriate because: d/c to SNF on 03/10/23     Consultants:    Procedures:   Antimicrobials:   Subjective: Pt c/o malaise   Objective: Vitals:   03/07/23 1605 03/07/23 1929 03/08/23 0405 03/08/23 0721  BP: 126/66 125/69 124/69 131/73  Pulse: 73 73 70 70  Resp: 18 16 18 15   Temp: 98.3 F (36.8 C) 98.1 F (36.7 C) 98.2 F (36.8 C) 97.9 F (36.6 C)  TempSrc: Oral Oral  Oral  SpO2: 100% 97% 97% 98%  Weight:      Height:        Intake/Output Summary (Last 24 hours) at 03/08/2023 0829 Last data filed at 03/07/2023 2236 Gross per 24 hour  Intake 1559.68 ml  Output --  Net 1559.68 ml   Filed Weights   03/04/23 1615  Weight: 90.9 kg    Examination:  General exam: Appears calm and comfortable  Respiratory system: Clear to auscultation. Respiratory effort normal. Cardiovascular system: S1 & S2+. No rubs, gallops or clicks.  Gastrointestinal system: Abdomen is nondistended, soft and nontender. Normal bowel sounds heard. Central nervous system: Alert and awake.  Psychiatry: Judgement and insight appear normal. Flat mood and affect    Data Reviewed: I have personally reviewed following labs and imaging studies  CBC: Recent Labs  Lab 03/04/23 1318 03/05/23 0642 03/06/23 0418 03/07/23 0436 03/08/23  0411  WBC 5.0 4.4 3.8* 2.7* 2.6*  NEUTROABS  --   --   --  1.2*  --   HGB 10.4* 9.2* 8.6* 9.2* 9.3*  HCT 33.6* 29.2* 27.4* 29.2* 28.6*  MCV 103.4* 101.0* 101.9* 101.0* 100.7*  PLT 99* 90* 81* 86* 94*   Basic Metabolic Panel: Recent Labs  Lab 03/04/23 1318 03/05/23 0642 03/06/23 0418 03/07/23 0436 03/08/23 0411  NA 144 147* 145 148* 146*  K 4.2 3.7 4.3 4.1 3.9  CL 118* 125* 123* 124* 118*  CO2 12* 15* 15* 20* 21*  GLUCOSE 109* 110* 101* 118* 116*  BUN 110* 81* 53* 39* 34*   CREATININE 3.61* 2.71* 2.15* 1.81* 1.74*  CALCIUM 8.7* 8.6* 8.8* 8.9 8.8*   GFR: Estimated Creatinine Clearance: 48.1 mL/min (A) (by C-G formula based on SCr of 1.74 mg/dL (H)). Liver Function Tests: Recent Labs  Lab 03/04/23 1318 03/07/23 0436  AST 38 31  ALT 14 16  ALKPHOS 69 67  BILITOT 1.5* 1.0  PROT 7.3 6.2*  ALBUMIN 3.3* 2.5*   No results for input(s): "LIPASE", "AMYLASE" in the last 168 hours. No results for input(s): "AMMONIA" in the last 168 hours. Coagulation Profile: No results for input(s): "INR", "PROTIME" in the last 168 hours. Cardiac Enzymes: No results for input(s): "CKTOTAL", "CKMB", "CKMBINDEX", "TROPONINI" in the last 168 hours. BNP (last 3 results) No results for input(s): "PROBNP" in the last 8760 hours. HbA1C: No results for input(s): "HGBA1C" in the last 72 hours. CBG: Recent Labs  Lab 03/06/23 2139 03/07/23 0910 03/07/23 1157 03/07/23 1604 03/07/23 2106  GLUCAP 92 81 109* 97 94   Lipid Profile: No results for input(s): "CHOL", "HDL", "LDLCALC", "TRIG", "CHOLHDL", "LDLDIRECT" in the last 72 hours. Thyroid Function Tests: No results for input(s): "TSH", "T4TOTAL", "FREET4", "T3FREE", "THYROIDAB" in the last 72 hours. Anemia Panel: No results for input(s): "VITAMINB12", "FOLATE", "FERRITIN", "TIBC", "IRON", "RETICCTPCT" in the last 72 hours. Sepsis Labs: No results for input(s): "PROCALCITON", "LATICACIDVEN" in the last 168 hours.  No results found for this or any previous visit (from the past 240 hour(s)).       Radiology Studies: No results found.      Scheduled Meds:  divalproex  1,000 mg Oral BID   enoxaparin (LOVENOX) injection  40 mg Subcutaneous QHS   fenofibrate  160 mg Oral Daily   insulin aspart  0-9 Units Subcutaneous TID WC   nicotine  21 mg Transdermal Daily   pantoprazole  40 mg Oral QAC breakfast   rosuvastatin  20 mg Oral QHS   Continuous Infusions:  dextrose 75 mL/hr at 03/07/23 2236     LOS: 3 days     Time spent: 25 mins     Charise Killian, MD [facility] Pager [phone]  If 7PM-7AM, please contact night-coverage [email or web address] 03/08/2023, 8:29 AM

## 2023-03-08 NOTE — TOC Progression Note (Signed)
Transition of Care [facility]) - Progression Note    Patient Details  Name: Albert Figueroa MRN: [id number] Date of Birth: [birth year]  Transition of Care [facility]) CM/SW Contact  Darolyn Rua, [place] Phone Number: 03/08/2023, 10:20 AM  Clinical Narrative:     Patient has insurance authorization for [employer], plan for Monday 6/10.  Plan Auth ID: [id number] 6/10-6/12   Expected Discharge Plan: Skilled Nursing Facility Barriers to Discharge: Continued Medical Work up  Expected Discharge Plan and Services     Post Acute Care Choice: Skilled Nursing Facility Living arrangements for the past 2 months: Apartment                                       Social Determinants of Health (SDOH) Interventions SDOH Screenings   Food Insecurity: No Food Insecurity (03/04/2023)  Housing: Low Risk  (03/04/2023)  Transportation Needs: No Transportation Needs (03/04/2023)  Utilities: Not At Risk (03/04/2023)  Tobacco Use: High Risk (03/04/2023)    Readmission Risk Interventions    03/06/2023    3:23 PM  Readmission Risk Prevention Plan  Transportation Screening Complete  PCP or Specialist Appt within 3-5 Days Complete  Social Work Consult for Recovery Care Planning/Counseling Complete  Palliative Care Screening Not Applicable  Medication Review [employer]) Complete

## 2023-03-09 DIAGNOSIS — N184 Chronic kidney disease, stage 4 (severe): Secondary | ICD-10-CM | POA: Diagnosis not present

## 2023-03-09 DIAGNOSIS — D61818 Other pancytopenia: Secondary | ICD-10-CM | POA: Diagnosis not present

## 2023-03-09 DIAGNOSIS — E870 Hyperosmolality and hypernatremia: Secondary | ICD-10-CM | POA: Diagnosis not present

## 2023-03-09 DIAGNOSIS — N179 Acute kidney failure, unspecified: Secondary | ICD-10-CM | POA: Diagnosis not present

## 2023-03-09 LAB — BASIC METABOLIC PANEL
Anion gap: 7 (ref 5–15)
BUN: 30 mg/dL — ABNORMAL HIGH (ref 8–23)
CO2: 22 mmol/L (ref 22–32)
Calcium: 8.5 mg/dL — ABNORMAL LOW (ref 8.9–10.3)
Chloride: 112 mmol/L — ABNORMAL HIGH (ref 98–111)
Creatinine, Ser: 1.65 mg/dL — ABNORMAL HIGH (ref 0.61–1.24)
GFR, Estimated: 46 mL/min — ABNORMAL LOW (ref >60)
Glucose, Bld: 91 mg/dL (ref 70–99)
Potassium: 4 mmol/L (ref 3.5–5.1)
Sodium: 141 mmol/L (ref 135–145)

## 2023-03-09 LAB — CBC
HCT: 29.9 % — ABNORMAL LOW (ref 39.0–52.0)
Hemoglobin: 9.6 g/dL — ABNORMAL LOW (ref 13.0–17.0)
MCH: 31.5 pg (ref 26.0–34.0)
MCHC: 32.1 g/dL (ref 30.0–36.0)
MCV: 98 fL (ref 80.0–100.0)
Platelets: 98 10*3/uL — ABNORMAL LOW (ref 150–400)
RBC: 3.05 MIL/uL — ABNORMAL LOW (ref 4.22–5.81)
RDW: 13.8 % (ref 11.5–15.5)
WBC: 2.9 10*3/uL — ABNORMAL LOW (ref 4.0–10.5)
nRBC: 0 % (ref 0.0–0.2)

## 2023-03-09 LAB — GLUCOSE, CAPILLARY
Glucose-Capillary: 87 mg/dL (ref 70–99)
Glucose-Capillary: 114 mg/dL — ABNORMAL HIGH (ref 70–99)
Glucose-Capillary: 96 mg/dL (ref 70–99)
Glucose-Capillary: 88 mg/dL (ref 70–99)

## 2023-03-09 NOTE — Progress Notes (Signed)
Mobility Specialist - Progress Note     03/09/23 1851  Mobility  Activity Ambulated with assistance in room;Transferred to/from [facility];Transferred from chair to bed  Level of Assistance Contact guard assist, steadying assist  Assistive Device BSC;Front wheel walker  Distance Ambulated (ft) 4 ft  Range of Motion/Exercises Active  Activity Response Tolerated well  Mobility Referral Yes  $Mobility charge 1 Mobility  Mobility Specialist Start Time (ACUTE ONLY) 1830  Mobility Specialist Stop Time (ACUTE ONLY) 1855  Mobility Specialist Time Calculation (min) (ACUTE ONLY) 25 min   Pt resting in bed on RA upon entry. Pt STS and ambulates to [facility] CGA with RW. Pt returned to bed and left with needs in reach and bed alarm activated.   Johnathan Hausen Mobility Specialist 03/09/23, 6:53 PM

## 2023-03-09 NOTE — Progress Notes (Signed)
PROGRESS NOTE   HPI was taken from Dr. Clyde Lundborg: Albert Figueroa is a 67 y.o. 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   Per her daughter (I called her daughter by phone), patient was recently diagnosed with a possible Parkinson's disease by his neurologist.  He has started on gabapentin, but patient could not tolerate gabapentin after taking it for 2 days.  Patient has dementia and is more confused recently.  Initially when I saw patient, he was mildly confused,  and oriented x 3.  He has history of chronic pain syndrome.  He states that he has chronic pain in both legs, bilateral hips.  He denies chest pain or abdominal pain.  No cough, shortness breath.  No nausea, vomiting, diarrhea.  No fever or chills.  Denies symptoms of UTI. Per her daughter, patient drinks little very little water, most of the time he just drinks some Pepsi.   Data reviewed independently and ED Course: pt was found to have WBC 5.5, AKI with creatinine 3.61, BUN 110, GFR 18 (recent baseline creatinine 1.9 on 01/27/2023), temperature normal, blood pressure 113/65, heart rate 88, RR 13, oxygen saturation 100% on room air.  Patient is placed on MedSurg bed for position.   CT of head: 1. Stable size and configuration of the shunted ventricles with unchanged right posterior approach ventricular shunt catheter with tip terminating in the anterior body of the right lateral ventricle. 2. Small bilateral subdural collections, measuring up to 5 mm in thickness on the left and 6 mm in thickness on the right, suggestive of overshunting.     BURECH MARCHESANO  WUJ:[id number] DOB: [birth year] DOA: 03/04/2023 PCP: Marisue Ivan, MD   Assessment & Plan:   Principal Problem:   Acute renal failure superimposed on stage 4  chronic kidney disease (HCC) Active Problems:   Acute metabolic encephalopathy   Chronic pain syndrome   Essential hypertension   Dyslipidemia   Thrombocytopenia (HCC)   Type II diabetes mellitus with renal manifestations (HCC)   Iron deficiency anemia   COPD (chronic obstructive pulmonary disease) (HCC)   Depression with anxiety   Seizure (HCC)   Tobacco use   AKI (acute kidney injury) (HCC)   Dehydration  Assessment and Plan: AKI: possibly due to dehydration & lasix, lisinopril use. Baseline Cr around 1.5. Will d/c IVFs. Holding lasix, lisinopril   Metabolic acidosis: resolved    Hypernatremia: resolved.    Acute metabolic encephalopathy: etiology unclear. CT head  was non-acute and showed ventricular shunt in place. Radiology also read "overshunting". Prior provider discussed this with neurosurgeon on-call, Dr. Katrinka Blazing, who reviewed the images and stated that it was a chronic change of over drainage and does not require revision if he has no symptoms. Mental status is back to baseline    Chronic pain syndrome: continue on home dose of oxycodone    HTN: holding home dose of lisinopril, lasix   HLD: continue on fenofibrate, statin    Pancytopenia: etiology unclear, possibly medication use (depakote). Will continue to monitor    DM2: well controlled, HbA1c 4.6. Unlikely that pt needs insulin.    IDA: H&H are stable. Not taking an iron supplement   COPD: w/o exacerbation. Bronchodilators prn    Depression: severity unknown. Continue on home dose of cymbalta     Hx of seizures: continue on  home dose of depakote. Seizure precautions   Tobacco use: nicotine patch to prevent w/drawal. Smoking cessation counseling x 5 min        DVT prophylaxis: lovenox  Code Status: full  Family Communication:  Disposition Plan: will d/c to SNF on 03/10/23 .  Level of care: Med-Surg  Status is: Inpatient Remains inpatient appropriate because: d/c to SNF on 03/10/23      Consultants:    Procedures:   Antimicrobials:   Subjective: Pt c/o fatigue   Objective: Vitals:   03/08/23 0721 03/08/23 1512 03/08/23 1939 03/09/23 0438  BP: 131/73 (!) 146/83 (!) 163/89 132/76  Pulse: 70 74 77 69  Resp: 15 16 18 16   Temp: 97.9 F (36.6 C) 98.2 F (36.8 C) 97.6 F (36.4 C) 98.4 F (36.9 C)  TempSrc: Oral Oral Oral Oral  SpO2: 98% 99% 99% 97%  Weight:      Height:        Intake/Output Summary (Last 24 hours) at 03/09/2023 0825 Last data filed at 03/09/2023 0558 Gross per 24 hour  Intake 2942.77 ml  Output 750 ml  Net 2192.77 ml   Filed Weights   03/04/23 1615  Weight: 90.9 kg    Examination:  General exam: Appears comfortable  Respiratory system: clear breath sounds b/l  Cardiovascular system: S1/S2+. No rubs or gallops  Gastrointestinal system: Abd is soft, NT, ND & hypoactive bowel sounds  Central nervous system: Alert & awake  Psychiatry: Judgement and insight appears at baseline. Flat mood and affect     Data Reviewed: I have personally reviewed following labs and imaging studies  CBC: Recent Labs  Lab 03/05/23 0642 03/06/23 0418 03/07/23 0436 03/08/23 0411 03/09/23 0420  WBC 4.4 3.8* 2.7* 2.6* 2.9*  NEUTROABS  --   --  1.2*  --   --   HGB 9.2* 8.6* 9.2* 9.3* 9.6*  HCT 29.2* 27.4* 29.2* 28.6* 29.9*  MCV 101.0* 101.9* 101.0* 100.7* 98.0  PLT 90* 81* 86* 94* 98*   Basic Metabolic Panel: Recent Labs  Lab 03/05/23 0642 03/06/23 0418 03/07/23 0436 03/08/23 0411 03/09/23 0420  NA 147* 145 148* 146* 141  K 3.7 4.3 4.1 3.9 4.0  CL 125* 123* 124* 118* 112*  CO2 15* 15* 20* 21* 22  GLUCOSE 110* 101* 118* 116* 91  BUN 81* 53* 39* 34* 30*  CREATININE 2.71* 2.15* 1.81* 1.74* 1.65*  CALCIUM 8.6* 8.8* 8.9 8.8* 8.5*   GFR: Estimated Creatinine Clearance: 50.8 mL/min (A) (by C-G formula based on SCr of 1.65 mg/dL (H)). Liver Function Tests: Recent Labs  Lab 03/04/23 1318 03/07/23 0436  AST 38 31  ALT 14 16  ALKPHOS  69 67  BILITOT 1.5* 1.0  PROT 7.3 6.2*  ALBUMIN 3.3* 2.5*   No results for input(s): "LIPASE", "AMYLASE" in the last 168 hours. No results for input(s): "AMMONIA" in the last 168 hours. Coagulation Profile: No results for input(s): "INR", "PROTIME" in the last 168 hours. Cardiac Enzymes: No results for input(s): "CKTOTAL", "CKMB", "CKMBINDEX", "TROPONINI" in the last 168 hours. BNP (last 3 results) No results for input(s): "PROBNP" in the last 8760 hours. HbA1C: No results for input(s): "HGBA1C" in the last 72 hours. CBG: Recent Labs  Lab 03/07/23 2106 03/08/23 0833 03/08/23 1302 03/08/23 1632 03/08/23 2119  GLUCAP 94 76 95 107* 118*   Lipid Profile: No results for input(s): "CHOL", "HDL", "LDLCALC", "TRIG", "CHOLHDL", "LDLDIRECT" in the last 72 hours. Thyroid Function Tests: No results for input(s): "TSH", "T4TOTAL", "FREET4", "T3FREE", "  THYROIDAB" in the last 72 hours. Anemia Panel: No results for input(s): "VITAMINB12", "FOLATE", "FERRITIN", "TIBC", "IRON", "RETICCTPCT" in the last 72 hours. Sepsis Labs: No results for input(s): "PROCALCITON", "LATICACIDVEN" in the last 168 hours.  No results found for this or any previous visit (from the past 240 hour(s)).       Radiology Studies: No results found.      Scheduled Meds:  divalproex  1,000 mg Oral BID   enoxaparin (LOVENOX) injection  40 mg Subcutaneous QHS   fenofibrate  160 mg Oral Daily   insulin aspart  0-9 Units Subcutaneous TID WC   nicotine  21 mg Transdermal Daily   pantoprazole  40 mg Oral QAC breakfast   rosuvastatin  20 mg Oral QHS   Continuous Infusions:  dextrose 75 mL/hr at 03/09/23 0558     LOS: 4 days    Time spent: 25 mins     Charise Killian, MD [facility] Pager [phone]  If 7PM-7AM, please contact night-coverage [email or web address] 03/09/2023, 8:25 AM

## 2023-03-09 NOTE — Progress Notes (Signed)
Mobility Specialist - Progress Note     03/09/23 1159  Mobility  Activity Ambulated with assistance in hallway;Stood at bedside  Level of Assistance Contact guard assist, steadying assist  Assistive Device Front wheel walker  Distance Ambulated (ft) 8 ft  Range of Motion/Exercises Active  Activity Response Tolerated well  Mobility Referral Yes  $Mobility charge 1 Mobility  Mobility Specialist Start Time (ACUTE ONLY) 1100  Mobility Specialist Stop Time (ACUTE ONLY) 1118  Mobility Specialist Time Calculation (min) (ACUTE ONLY) 18 min   Pt resting in bed on RA upon entry. Pt STS MinA and ambulates to chair across room CGA with RW. Pt gait is very slow but moderately stable. Pt takes a seated rest break for several minutes while NT takes sugar/vitals. Pt left with needs in reach and NT present.

## 2023-03-09 NOTE — Progress Notes (Signed)
Mobility Specialist - Progress Note     03/09/23 1251  Mobility  Activity Transferred to/from BSC;Stood at bedside  Level of Assistance Contact guard assist, steadying assist  Assistive Device Front wheel walker  Distance Ambulated (ft) 3 ft  Range of Motion/Exercises Active  Activity Response Tolerated well  Mobility Referral Yes  $Mobility charge 1 Mobility  Mobility Specialist Start Time (ACUTE ONLY) 1240  Mobility Specialist Stop Time (ACUTE ONLY) 1251  Mobility Specialist Time Calculation (min) (ACUTE ONLY) 11 min   Pt resting in bed upon entry. Pt STS and ambulates to BSC with RW CGA. Pt returned to bed and left with needs in reach.   Johnathan Hausen Mobility Specialist 03/09/23, 3:04 PM

## 2023-03-09 NOTE — Progress Notes (Signed)
Mobility Specialist - Progress Note    03/09/23 1202  Mobility  Activity Ambulated with assistance to bathroom  Level of Assistance Contact guard assist, steadying assist  Assistive Device Front wheel walker  Distance Ambulated (ft) 10 ft  Range of Motion/Exercises Active  Activity Response Tolerated well  Mobility Referral Yes  $Mobility charge 1 Mobility  Mobility Specialist Start Time (ACUTE ONLY) 1126  Mobility Specialist Stop Time (ACUTE ONLY) 1156  Mobility Specialist Time Calculation (min) (ACUTE ONLY) 30 min   Pt resting in chair on RA upon entry. Pt endorses fatigues prior to mobility. Pt STS MinA and ambulates to bathroom CGA with RW. Pt STS MinA from toilet but begins to buckle, pt took immediate seated rest break before being transferred to recliner and rolled back into room. (RN Notified). Pt returned to bed and left with needs in reach and bed alarm activated.

## 2023-03-10 ENCOUNTER — Ambulatory Visit: Payer: 59

## 2023-03-10 ENCOUNTER — Inpatient Hospital Stay: Payer: 59

## 2023-03-10 DIAGNOSIS — R4182 Altered mental status, unspecified: Secondary | ICD-10-CM | POA: Diagnosis not present

## 2023-03-10 DIAGNOSIS — R569 Unspecified convulsions: Secondary | ICD-10-CM

## 2023-03-10 LAB — BASIC METABOLIC PANEL
Anion gap: 10 (ref 5–15)
BUN: 31 mg/dL — ABNORMAL HIGH (ref 8–23)
CO2: 22 mmol/L (ref 22–32)
Calcium: 8.7 mg/dL — ABNORMAL LOW (ref 8.9–10.3)
Chloride: 106 mmol/L (ref 98–111)
Creatinine, Ser: 1.59 mg/dL — ABNORMAL HIGH (ref 0.61–1.24)
GFR, Estimated: 48 mL/min — ABNORMAL LOW (ref >60)
Glucose, Bld: 84 mg/dL (ref 70–99)
Potassium: 3.7 mmol/L (ref 3.5–5.1)
Sodium: 138 mmol/L (ref 135–145)

## 2023-03-10 LAB — CBC
HCT: 29.6 % — ABNORMAL LOW (ref 39.0–52.0)
Hemoglobin: 9.7 g/dL — ABNORMAL LOW (ref 13.0–17.0)
MCH: 31.6 pg (ref 26.0–34.0)
MCHC: 32.8 g/dL (ref 30.0–36.0)
MCV: 96.4 fL (ref 80.0–100.0)
Platelets: 96 10*3/uL — ABNORMAL LOW (ref 150–400)
RBC: 3.07 MIL/uL — ABNORMAL LOW (ref 4.22–5.81)
RDW: 13.7 % (ref 11.5–15.5)
WBC: 2.8 10*3/uL — ABNORMAL LOW (ref 4.0–10.5)
nRBC: 0 % (ref 0.0–0.2)

## 2023-03-10 LAB — HEPATIC FUNCTION PANEL
ALT: 10 U/L (ref 0–44)
AST: 19 U/L (ref 15–41)
Albumin: 2.6 g/dL — ABNORMAL LOW (ref 3.5–5.0)
Alkaline Phosphatase: 61 U/L (ref 38–126)
Bilirubin, Direct: 0.2 mg/dL (ref 0.0–0.2)
Indirect Bilirubin: 0.4 mg/dL (ref 0.3–0.9)
Total Bilirubin: 0.6 mg/dL (ref 0.3–1.2)
Total Protein: 6 g/dL — ABNORMAL LOW (ref 6.5–8.1)

## 2023-03-10 LAB — GLUCOSE, CAPILLARY
Glucose-Capillary: 78 mg/dL (ref 70–99)
Glucose-Capillary: 90 mg/dL (ref 70–99)
Glucose-Capillary: 79 mg/dL (ref 70–99)
Glucose-Capillary: 86 mg/dL (ref 70–99)
Glucose-Capillary: 108 mg/dL — ABNORMAL HIGH (ref 70–99)

## 2023-03-10 MED ORDER — POLYETHYLENE GLYCOL 3350 17 G PO PACK
17.0000 g | PACK | Freq: Every day | ORAL | Status: DC
  Administered 2023-03-10 – 2023-03-11 (×2): 17 g via ORAL
  Filled 2023-03-10 (×2): qty 1

## 2023-03-10 MED ORDER — OXYCODONE HCL 5 MG PO TABS
10.0000 mg | ORAL_TABLET | Freq: Three times a day (TID) | ORAL | Status: DC | PRN
  Administered 2023-03-10 – 2023-03-11 (×2): 10 mg via ORAL
  Filled 2023-03-10 (×2): qty 2

## 2023-03-10 MED ORDER — SENNA 8.6 MG PO TABS
1.0000 | ORAL_TABLET | Freq: Every day | ORAL | Status: DC
  Administered 2023-03-10 – 2023-03-11 (×2): 8.6 mg via ORAL
  Filled 2023-03-10 (×2): qty 1

## 2023-03-10 NOTE — Progress Notes (Addendum)
PROGRESS NOTE   HPI was taken from Dr. Clyde Lundborg: Albert Figueroa is a 66 y.o. 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   Per her daughter (I called her daughter by phone), patient was recently diagnosed with a possible Parkinson's disease by his neurologist.  He has started on gabapentin, but patient could not tolerate gabapentin after taking it for 2 days.  Patient has dementia and is more confused recently.  Initially when I saw patient, he was mildly confused,  and oriented x 3.  He has history of chronic pain syndrome.  He states that he has chronic pain in both legs, bilateral hips.  He denies chest pain or abdominal pain.  No cough, shortness breath.  No nausea, vomiting, diarrhea.  No fever or chills.  Denies symptoms of UTI. Per her daughter, patient drinks little very little water, most of the time he just drinks some Pepsi.   Data reviewed independently and ED Course: pt was found to have WBC 5.5, AKI with creatinine 3.61, BUN 110, GFR 18 (recent baseline creatinine 1.9 on 01/27/2023), temperature normal, blood pressure 113/65, heart rate 88, RR 13, oxygen saturation 100% on room air.  Patient is placed on MedSurg bed for position.   CT of head: 1. Stable size and configuration of the shunted ventricles with unchanged right posterior approach ventricular shunt catheter with tip terminating in the anterior body of the right lateral ventricle. 2. Small bilateral subdural collections, measuring up to 5 mm in thickness on the left and 6 mm in thickness on the right, suggestive of overshunting.     Albert Figueroa  ZOX:[id number] DOB: [birth year] DOA: 03/04/2023 PCP: Marisue Ivan, MD   Assessment & Plan:   Principal Problem:   Acute renal failure superimposed on stage 4  chronic kidney disease (HCC) Active Problems:   Acute metabolic encephalopathy   Chronic pain syndrome   Essential hypertension   Dyslipidemia   Thrombocytopenia (HCC)   Type II diabetes mellitus with renal manifestations (HCC)   Iron deficiency anemia   COPD (chronic obstructive pulmonary disease) (HCC)   Depression with anxiety   Seizure (HCC)   Tobacco use   AKI (acute kidney injury) (HCC)   Dehydration  Assessment and Plan: AKI: possibly due to dehydration & lasix, lisinopril use. Baseline Cr around 1.5. now off IV fluids. Cr today 1.59 essentially at baseline  Metabolic acidosis: resolved    Hypernatremia: resolved.    Acute metabolic encephalopathy: etiology unclear. CT head  was non-acute and showed ventricular shunt in place. Radiology also read "overshunting". Prior provider discussed this with neurosurgeon on-call, Dr. Katrinka Blazing, who reviewed the images and stated that it was a chronic change of over drainage and does not require revision if he has no symptoms. Valproic acid level normal. B12 wnl. Home oxycodone likely contributes. Today significantly worse, probably hospital delirium. No fever or nuchal rigidity  or headache to suggest meningitis - will check mri and eeg (give seizure history) - will decrease home oxycodone to 10   Chronic pain syndrome: decrease dose of oxy   HTN: holding home dose of lisinopril, lasix   HLD: continue on fenofibrate, statin    Pancytopenia: etiology unclear, possibly medication use (depakote). Smear unremarkable. Stable. Would plan on hematology referral at d/c   DM2: well controlled, HbA1c 4.6. Unlikely that pt needs  insulin.    IDA: H&H are stable. Not taking an iron supplement   COPD: w/o exacerbation. Bronchodilators prn    Depression: severity unknown. Continue on home dose of cymbalta     Hx of seizures: continue on home dose of depakote. Seizure precautions   Tobacco use: nicotine patch to prevent w/drawal. Smoking  cessation counseling x 5 min        DVT prophylaxis: lovenox  Code Status: full (need to clarify this as patient has a DNR in the chart) Family Communication: none at bedside. No answer and no voicemail when daughter called today Disposition Plan: for SNF, has auth through 6/12  Level of care: Med-Surg  Status is: Inpatient Remains inpatient appropriate because: significantly worsening confusion today    Consultants:    Procedures:   Antimicrobials:   Subjective: Confused, no complaints  Objective: Vitals:   03/09/23 1610 03/09/23 1936 03/10/23 0427 03/10/23 0735  BP: (!) 146/76 (!) 145/83 (!) 143/80 (!) 141/82  Pulse: 70 73 67 77  Resp: 18 18 20 16   Temp: 97.9 F (36.6 C) 98 F (36.7 C) 98 F (36.7 C) 97.9 F (36.6 C)  TempSrc:  Oral Oral Oral  SpO2: 98% 99% 99% 100%  Weight:      Height:        Intake/Output Summary (Last 24 hours) at 03/10/2023 1217 Last data filed at 03/10/2023 1025 Gross per 24 hour  Intake 1799.35 ml  Output 2450 ml  Net -650.65 ml   Filed Weights   03/04/23 1615  Weight: 90.9 kg    Examination:  General exam: Appears comfortable  Respiratory system: clear breath sounds b/l  Cardiovascular system: S1/S2+. No rubs or gallops  Gastrointestinal system: Abd is soft, NT, ND & hypoactive bowel sounds  Central nervous system: confused, oriented to self, moving all 4 extremities Psychiatry: confused    Data Reviewed: I have personally reviewed following labs and imaging studies  CBC: Recent Labs  Lab 03/06/23 0418 03/07/23 0436 03/08/23 0411 03/09/23 0420 03/10/23 0418  WBC 3.8* 2.7* 2.6* 2.9* 2.8*  NEUTROABS  --  1.2*  --   --   --   HGB 8.6* 9.2* 9.3* 9.6* 9.7*  HCT 27.4* 29.2* 28.6* 29.9* 29.6*  MCV 101.9* 101.0* 100.7* 98.0 96.4  PLT 81* 86* 94* 98* 96*   Basic Metabolic Panel: Recent Labs  Lab 03/06/23 0418 03/07/23 0436 03/08/23 0411 03/09/23 0420 03/10/23 0418  NA 145 148* 146* 141 138  K 4.3 4.1 3.9  4.0 3.7  CL 123* 124* 118* 112* 106  CO2 15* 20* 21* 22 22  GLUCOSE 101* 118* 116* 91 84  BUN 53* 39* 34* 30* 31*  CREATININE 2.15* 1.81* 1.74* 1.65* 1.59*  CALCIUM 8.8* 8.9 8.8* 8.5* 8.7*   GFR: Estimated Creatinine Clearance: 52.7 mL/min (A) (by C-G formula based on SCr of 1.59 mg/dL (H)). Liver Function Tests: Recent Labs  Lab 03/04/23 1318 03/07/23 0436  AST 38 31  ALT 14 16  ALKPHOS 69 67  BILITOT 1.5* 1.0  PROT 7.3 6.2*  ALBUMIN 3.3* 2.5*   No results for input(s): "LIPASE", "AMYLASE" in the last 168 hours. No results for input(s): "AMMONIA" in the last 168 hours. Coagulation Profile: No results for input(s): "INR", "PROTIME" in the last 168 hours. Cardiac Enzymes: No results for input(s): "CKTOTAL", "CKMB", "CKMBINDEX", "TROPONINI" in the last 168 hours. BNP (last 3 results) No results for input(s): "PROBNP" in the last 8760 hours. HbA1C: No results for input(s): "HGBA1C" in the  last 72 hours. CBG: Recent Labs  Lab 03/09/23 1125 03/09/23 1749 03/09/23 2101 03/10/23 0753 03/10/23 1116  GLUCAP 114* 96 88 78 90   Lipid Profile: No results for input(s): "CHOL", "HDL", "LDLCALC", "TRIG", "CHOLHDL", "LDLDIRECT" in the last 72 hours. Thyroid Function Tests: No results for input(s): "TSH", "T4TOTAL", "FREET4", "T3FREE", "THYROIDAB" in the last 72 hours. Anemia Panel: No results for input(s): "VITAMINB12", "FOLATE", "FERRITIN", "TIBC", "IRON", "RETICCTPCT" in the last 72 hours. Sepsis Labs: No results for input(s): "PROCALCITON", "LATICACIDVEN" in the last 168 hours.  No results found for this or any previous visit (from the past 240 hour(s)).       Radiology Studies: No results found.      Scheduled Meds:  divalproex  1,000 mg Oral BID   enoxaparin (LOVENOX) injection  40 mg Subcutaneous QHS   fenofibrate  160 mg Oral Daily   insulin aspart  0-9 Units Subcutaneous TID WC   nicotine  21 mg Transdermal Daily   pantoprazole  40 mg Oral QAC breakfast    rosuvastatin  20 mg Oral QHS   Continuous Infusions:  dextrose Stopped (03/10/23 0438)     LOS: 5 days    Time spent: 35 mins     Silvano Bilis, MD [facility] If 7PM-7AM, please contact night-coverage [email or web address] 03/10/2023, 12:17 PM

## 2023-03-10 NOTE — Procedures (Signed)
Patient Name: CALIPH BOROWIAK  MRN: [id number]  Epilepsy Attending: Charlsie Quest  Referring Physician/Provider: Kathrynn Running, MD  Date: 03/10/2023 Duration: 35.41 mins  Patient history: 67yo M with ams getting eeg to evaluate for seizure.  Level of alertness: Awake  AEDs during EEG study: VPA  Technical aspects: This EEG study was done with scalp electrodes positioned according to the 10-20 International system of electrode placement. Electrical activity was reviewed with band pass filter of 1-70Hz , sensitivity of 7 uV/mm, display speed of 2mm/sec with a 60Hz  notched filter applied as appropriate. EEG data were recorded continuously and digitally stored.  Video monitoring was available and reviewed as appropriate.  Description: EEG showed continuous generalized 5 to 6 Hz theta slowing. Hyperventilation and photic stimulation were not performed.     ABNORMALITY - Continuous slow, generalized  IMPRESSION: This study is suggestive of moderate diffuse encephalopathy, nonspecific etiology. No seizures or epileptiform discharges were seen throughout the recording.  Roise Emert Annabelle Harman

## 2023-03-10 NOTE — Procedures (Signed)
Will do eeg after MRI completed.

## 2023-03-10 NOTE — Progress Notes (Signed)
PT Cancellation Note  Patient Details Name: Albert Figueroa MRN: [id number] DOB: October 02, 1955   Cancelled Treatment:    Reason Eval/Treat Not Completed: Patient at procedure or test/unavailable  Pt off unit for testing.  Will return at a later date.   Danielle Dess 03/10/2023, 3:18 PM

## 2023-03-10 NOTE — TOC Progression Note (Signed)
Transition of Care [facility]) - Progression Note    Patient Details  Name: Albert Figueroa MRN: [id number] Date of Birth: [birth year]  [facility]) CM/SW Contact  Chapman Fitch, RN Phone Number: 03/10/2023, 12:55 PM  Clinical Narrative:    Per MD patient not medically ready for discharge [facility] notified    Expected Discharge Plan: Skilled Nursing Facility Barriers to Discharge: Continued Medical Work up  Expected Discharge Plan and Services     Post Acute Care Choice: Skilled Nursing Facility Living arrangements for the past 2 months: Apartment                                       Social Determinants of Health (SDOH) Interventions SDOH Screenings   Food Insecurity: No Food Insecurity (03/04/2023)  Housing: Low Risk  (03/04/2023)  Transportation Needs: No Transportation Needs (03/04/2023)  Utilities: Not At Risk (03/04/2023)  Tobacco Use: High Risk (03/04/2023)    Readmission Risk Interventions    03/06/2023    3:23 PM  Readmission Risk Prevention Plan  Transportation Screening Complete  PCP or Specialist Appt within 3-5 Days Complete  Social Work Consult for Recovery Care Planning/Counseling Complete  Palliative Care Screening Not Applicable  Medication Review Oceanographer) Complete

## 2023-03-10 NOTE — Care Management Important Message (Signed)
Important Message  Patient Details  Name: Albert Figueroa MRN: [id number] Date of Birth: 25-Mar-1956   Medicare Important Message Given:  Yes     Johnell Comings 03/10/2023, 11:59 AM

## 2023-03-10 NOTE — Progress Notes (Signed)
Occupational Therapy Treatment Patient Details Name: Albert Figueroa MRN: [id number] DOB: 1956-08-14 Today's Date: 03/10/2023   History of present illness Albert Figueroa is a 67yoM who comes [facility] after acute onset weakness, AMS. Pt dehydrated. Brain imaging negative for CVA. 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   OT comments  Pt seen for OT tx, initially sleeping but wakes easily to OT's voice and agreeable to session. Pt required increased time/effort to sit EOB. Prior to standing, pt required several VC to shift weight towards the EOB prior to stand to get BOS closer to his COG prior to standing attempts after difficulty with first 2 attempts resulting in inability to clear buttocks from EOB. On 3rd try, pt completed with MOD A and initial unsteadiness. He required MIN A for step pivot to the recliner with VC for RW mgt and hand placement. Pt required increased assist today than previous therapy sessions. Continues to benefit from OT services. Continue with POC.    Recommendations for follow up therapy are one component of a multi-disciplinary discharge planning process, led by the attending physician.  Recommendations may be updated based on patient status, additional functional criteria and insurance authorization.    Assistance Recommended at Discharge Intermittent Supervision/Assistance  Patient can return home with the following  A little help with walking and/or transfers;A little help with bathing/dressing/bathroom;Help with stairs or ramp for entrance;Assistance with cooking/housework;Assist for transportation;Direct supervision/assist for medications management;Direct supervision/assist for financial management   Equipment Recommendations  BSC/3in1    Recommendations for Other Services      Precautions  / Restrictions Precautions Precautions: Fall Restrictions Weight Bearing Restrictions: No       Mobility Bed Mobility Overal bed mobility: Needs Assistance Bed Mobility: Supine to Sit     Supine to sit: Supervision, HOB elevated     General bed mobility comments: increased time/effort    Transfers Overall transfer level: Needs assistance Equipment used: Rolling walker (2 wheels) Transfers: Sit to/from Stand, Bed to chair/wheelchair/BSC Sit to Stand: Mod assist     Step pivot transfers: Min assist     General transfer comment: VC for scooting towards EOB first, MOD A to complete     Balance Overall balance assessment: Needs assistance Sitting-balance support: No upper extremity supported, Feet supported Sitting balance-Leahy Scale: Good     Standing balance support: Bilateral upper extremity supported, Reliant on assistive device for balance Standing balance-Leahy Scale: Poor Standing balance comment: heavy BUE support today                           ADL either performed or assessed with clinical judgement   ADL                                              Extremity/Trunk Assessment              Vision       Perception     Praxis      Cognition Arousal/Alertness: Awake/alert Behavior During Therapy: WFL for tasks assessed/performed Overall Cognitive Status: History of cognitive impairments - at baseline  General Comments: a bit groggy but follows commands        Exercises      Shoulder Instructions       General Comments      Pertinent Vitals/ Pain       Pain Assessment Pain Assessment: 0-10 Pain Score: 8  Pain Location: R knee Pain Intervention(s): Limited activity within patient's tolerance, Monitored during session, Premedicated before session, Repositioned  Home Living                                          Prior  Functioning/Environment              Frequency  Min 2X/week        Progress Toward Goals  OT Goals(current goals can now be found in the care plan section)  Progress towards OT goals: Progressing toward goals  Acute Rehab OT Goals Patient Stated Goal: to go home OT Goal Formulation: With patient Time For Goal Achievement: 03/20/23 Potential to Achieve Goals: Good  Plan Discharge plan remains appropriate;Frequency remains appropriate    Co-evaluation                 AM-PAC OT "6 Clicks" Daily Activity     Outcome Measure   Help from another person eating meals?: None Help from another person taking care of personal grooming?: A Little Help from another person toileting, which includes using toliet, bedpan, or urinal?: A Little Help from another person bathing (including washing, rinsing, drying)?: A Little Help from another person to put on and taking off regular upper body clothing?: A Little Help from another person to put on and taking off regular lower body clothing?: A Little 6 Click Score: 19    End of Session Equipment Utilized During Treatment: Rolling walker (2 wheels)  OT Visit Diagnosis: Other abnormalities of gait and mobility (R26.89);Muscle weakness (generalized) (M62.81)   Activity Tolerance Patient tolerated treatment well   Patient Left in chair;with call bell/phone within reach;with chair alarm set   Nurse Communication          Time: 1130-1141 OT Time Calculation (min): 11 min  Charges: OT General Charges $OT Visit: 1 Visit OT Treatments $Self Care/Home Management : 8-22 mins  Arman Filter., MPH, MS, OTR/L ascom [phone] 03/10/23, 1:03 PM

## 2023-03-10 NOTE — Progress Notes (Signed)
Eeg done 

## 2023-03-10 NOTE — TOC Progression Note (Signed)
Transition of Care [facility]) - Progression Note    Patient Details  Name: FILIP LUTEN MRN: [id number] Date of Birth: 01/21/1956  [facility]) CM/SW Contact  Chapman Fitch, RN Phone Number: 03/10/2023, 2:42 PM  Clinical Narrative:     Patients sister Arline Asp confirms she will be able to provide transport to [facility] tomorrow    Expected Discharge Plan: Skilled Nursing Facility Barriers to Discharge: Continued Medical Work up  Expected Discharge Plan and Services     Post Acute Care Choice: Skilled Nursing Facility Living arrangements for the past 2 months: Apartment                                       Social Determinants of Health (SDOH) Interventions SDOH Screenings   Food Insecurity: No Food Insecurity (03/04/2023)  Housing: Low Risk  (03/04/2023)  Transportation Needs: No Transportation Needs (03/04/2023)  Utilities: Not At Risk (03/04/2023)  Tobacco Use: High Risk (03/04/2023)    Readmission Risk Interventions    03/06/2023    3:23 PM  Readmission Risk Prevention Plan  Transportation Screening Complete  PCP or Specialist Appt within 3-5 Days Complete  Social Work Consult for Recovery Care Planning/Counseling Complete  Palliative Care Screening Not Applicable  Medication Review Oceanographer) Complete

## 2023-03-11 LAB — CBC
HCT: 29 % — ABNORMAL LOW (ref 39.0–52.0)
Hemoglobin: 9.4 g/dL — ABNORMAL LOW (ref 13.0–17.0)
MCH: 31.4 pg (ref 26.0–34.0)
MCHC: 32.4 g/dL (ref 30.0–36.0)
MCV: 97 fL (ref 80.0–100.0)
Platelets: 101 10*3/uL — ABNORMAL LOW (ref 150–400)
RBC: 2.99 MIL/uL — ABNORMAL LOW (ref 4.22–5.81)
RDW: 13.7 % (ref 11.5–15.5)
WBC: 3.6 10*3/uL — ABNORMAL LOW (ref 4.0–10.5)
nRBC: 0 % (ref 0.0–0.2)

## 2023-03-11 LAB — BASIC METABOLIC PANEL
Anion gap: 6 (ref 5–15)
BUN: 36 mg/dL — ABNORMAL HIGH (ref 8–23)
CO2: 25 mmol/L (ref 22–32)
Calcium: 8.3 mg/dL — ABNORMAL LOW (ref 8.9–10.3)
Chloride: 107 mmol/L (ref 98–111)
Creatinine, Ser: 1.74 mg/dL — ABNORMAL HIGH (ref 0.61–1.24)
GFR, Estimated: 43 mL/min — ABNORMAL LOW (ref >60)
Glucose, Bld: 85 mg/dL (ref 70–99)
Potassium: 4.1 mmol/L (ref 3.5–5.1)
Sodium: 138 mmol/L (ref 135–145)

## 2023-03-11 LAB — GLUCOSE, CAPILLARY
Glucose-Capillary: 75 mg/dL (ref 70–99)
Glucose-Capillary: 122 mg/dL — ABNORMAL HIGH (ref 70–99)
Glucose-Capillary: 108 mg/dL — ABNORMAL HIGH (ref 70–99)
Glucose-Capillary: 81 mg/dL (ref 70–99)

## 2023-03-11 MED ORDER — OXYCODONE HCL 20 MG PO TABS: 1.0000 | ORAL_TABLET | Freq: Three times a day (TID) | ORAL | 0 refills | Status: DC | PRN

## 2023-03-11 MED ORDER — GUAIFENESIN ER 600 MG PO TB12: 600.0000 mg | ORAL_TABLET | Freq: Two times a day (BID) | ORAL | 0 refills | Status: AC

## 2023-03-11 NOTE — Discharge Summary (Signed)
Physician Discharge Summary   Patient: Albert Figueroa MRN: [id number] DOB: [birth year]  Admit date:     03/04/2023  Discharge date: 03/11/23  Discharge Physician: Marcelino Duster   PCP: Marisue Ivan, MD   Recommendations at discharge:    PCP follow up in 1 week. Monitor kidney function, electrolytes. Avoid nephrotoxic drugs. Avoid polypharmacy. Hematology evaluation as outpatient for pancytopenia  Discharge Diagnoses: Principal Problem:   Acute renal failure superimposed on stage 4 chronic kidney disease (HCC) Active Problems:   Acute metabolic encephalopathy   Chronic pain syndrome   Essential hypertension   Dyslipidemia   Thrombocytopenia (HCC)   Type II diabetes mellitus with renal manifestations (HCC)   Iron deficiency anemia   COPD (chronic obstructive pulmonary disease) (HCC)   Depression with anxiety   Seizure (HCC)   Tobacco use   AKI (acute kidney injury) (HCC)   Dehydration  Resolved Problems:   * No resolved hospital problems. *  Hospital Course: Albert Figueroa is a 67 y.o. 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  Patient was found to have acute renal failure on CKD stage IV, hypernatremia, metabolic acidosis.  Patient is started on gentle IV fluids, nephrotoxic medications including Lasix, lisinopril held.  Patient had a CT head which is unremarkable showed ventricular shunt in place.  Neurosurgeon on-call reviewed the images of CT scan did not recommend any intervention given chronic changes of over drainage.  Patient's valproic acid level, B12 normal.  Home oxycodone dose decreased to 10 mg.  He also had MRI brain, EEG which were unremarkable.  He does have  pancytopenia which will need hematology follow-up outpatient.  Patient's kidney function improved, electrolytes better.  Gradually his mental status improved and is at baseline alert awake oriented.  Patient is hemodynamically stable to be discharged back to skilled nursing facility today.  He will need PCP follow-up to continue to monitor kidney function, electrolytes.  Outpatient hematology evaluation for pancytopenia.  Follow-up neurology as scheduled.       Consultants: None Procedures performed: none  Disposition: Skilled nursing facility Diet recommendation:  Discharge Diet Orders (From admission, onward)     Start     Ordered   03/11/23 0000  Diet - low sodium heart healthy        03/11/23 1238   03/11/23 0000  Diet Carb Modified        03/11/23 1238           Cardiac and Carb modified diet DISCHARGE MEDICATION: Allergies as of 03/11/2023       Reactions   Gabapentin Other (See Comments)   Unknown reaction (Per Significant Other)   Amlodipine    Other reaction(s): Other (see comments) Caused elevated blood pressure and headaches        Medication List     STOP taking these medications    cyclobenzaprine 10 MG tablet Commonly known as: FLEXERIL   dapagliflozin propanediol 10 MG Tabs tablet Commonly known as: FARXIGA   DULoxetine 30 MG capsule Commonly known as: CYMBALTA   ferrous sulfate 325 (65 FE) MG tablet   hydrOXYzine 10 MG tablet Commonly known as: ATARAX   lisinopril 20 MG tablet Commonly known as: ZESTRIL   lisinopril-hydrochlorothiazide 20-25 MG tablet Commonly known as: ZESTORETIC   Ozempic (  0.25 or 0.5 MG/DOSE) 2 MG/3ML Sopn Generic drug: Semaglutide(0.25 or 0.5MG /DOS)   tiZANidine 4 MG tablet Commonly known as: ZANAFLEX       TAKE these medications    acetaminophen 325 MG tablet Commonly known as: TYLENOL Take 2 tablets (650 mg total) by mouth every 6 (six) hours as needed for mild pain (or Fever >/= 101).   azelastine 0.05  % ophthalmic solution Commonly known as: OPTIVAR Place 1 drop into both eyes 2 (two) times daily.   Combivent Respimat 20-100 MCG/ACT Aers respimat Generic drug: Ipratropium-Albuterol Inhale 1 puff into the lungs 4 (four) times daily.   Daliresp 250 MCG Tabs Generic drug: Roflumilast Take 250 mcg by mouth at bedtime.   Dexilant 60 MG capsule Generic drug: dexlansoprazole Take 60 mg by mouth daily.   divalproex 500 MG 24 hr tablet Commonly known as: DEPAKOTE ER Take 1,000 mg by mouth in the morning and at bedtime.   erythromycin ophthalmic ointment Place 1 Application into both eyes at bedtime.   fenofibrate 160 MG tablet Take 160 mg by mouth daily.   furosemide 20 MG tablet Commonly known as: LASIX Take 10 mg by mouth every other day.   guaiFENesin 600 MG 12 hr tablet Commonly known as: Mucinex Take 1 tablet (600 mg total) by mouth 2 (two) times daily for 10 days.   lidocaine 5 % Commonly known as: LIDODERM Place 1 patch onto the skin daily.   naloxone 4 MG/0.1ML Liqd nasal spray kit Commonly known as: NARCAN Place 1 spray into the nose once.   Oxycodone HCl 20 MG Tabs Take 1 tablet (20 mg total) by mouth every 8 (eight) hours as needed.   rosuvastatin 20 MG tablet Commonly known as: CRESTOR Take 20 mg by mouth at bedtime.        Discharge Exam: Filed Weights   03/04/23 1615  Weight: 90.9 kg      03/11/2023    9:19 AM 03/11/2023    4:03 AM 08-Apr-2023    7:42 PM  Vitals with BMI  Systolic 131 119 213  Diastolic 68 64 70  Pulse 62 69 71  General - Elderly looking Caucasian male, no apparent distress HEENT - PERRLA, EOMI, atraumatic head, non tender sinuses. Lung - Clear, bibasilar Rales Heart - S1, S2 heard, no murmurs, rubs, 1+ pedal edema Neuro - Alert, awake and oriented x x 3, non focal exam. Skin - Warm and dry.  Condition at discharge: stable  The results of significant diagnostics from this hospitalization (including imaging, microbiology,  ancillary and laboratory) are listed below for reference.   Imaging Studies: EEG adult  Result Date: 04/08/2023 Charlsie Quest, MD     04-08-23  4:51 PM Patient Name: Albert Figueroa MRN: [id number] Epilepsy Attending: Charlsie Quest Referring Physician/Provider: Kathrynn Running, MD Date: 04-08-2023 Duration: 35.41 mins Patient history: 67yo M with ams getting eeg to evaluate for seizure. Level of alertness: Awake AEDs during EEG study: VPA Technical aspects: This EEG study was done with scalp electrodes positioned according to the 10-20 International system of electrode placement. Electrical activity was reviewed with band pass filter of 1-70Hz , sensitivity of 7 uV/mm, display speed of 38mm/sec with a 60Hz  notched filter applied as appropriate. EEG data were recorded continuously and digitally stored.  Video monitoring was available and reviewed as appropriate. Description: EEG showed continuous generalized 5 to 6 Hz theta slowing. Hyperventilation and photic stimulation were not performed.   ABNORMALITY - Continuous slow, generalized IMPRESSION: This  study is suggestive of moderate diffuse encephalopathy, nonspecific etiology. No seizures or epileptiform discharges were seen throughout the recording. Charlsie Quest   MR BRAIN WO CONTRAST  Result Date: 03/10/2023 CLINICAL DATA:  Presents with altered mental status. EXAM: MRI HEAD WITHOUT CONTRAST TECHNIQUE: Multiplanar, multiecho pulse sequences of the brain and surrounding structures were obtained without intravenous contrast. COMPARISON:  CT head 03/04/2023, brain MRI 06/15/2021 FINDINGS: Axial T1 and coronal T2 images were not obtained. Brain: There is no acute intracranial hemorrhage, acute extra-axial fluid collection, or acute infarct. Background parenchymal volume is normal. A right ventricular catheter is in place terminating in the body of the right lateral ventricle. The ventricles are stable in size compared to the head CT from 03/04/2023  and brain MRI from 2022. Bilateral subdural collections measuring up to approximately 5 mm in thickness are unchanged since the recent prior head CT and were present on the brain MRI from 2022. Encephalomalacia in the bilateral frontal lobes with chronic blood products on the right is unchanged. The pituitary and suprasellar region are normal. There is no mass lesion. There is no mass effect or midline shift. Vascular: Normal flow voids. Skull and upper cervical spine: Normal marrow signal. Sinuses/Orbits: The paranasal sinuses are clear. The globes and orbits are unremarkable. Other: None. IMPRESSION: 1. No acute intracranial pathology. 2. Stable right parietal approach ventricular catheter with unchanged size and configuration of the ventricles. 3. 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 While this can be seen in the setting of over shunting, the collections are present on the brain MRI from 2022 and the ventricles are unchanged in size since that study. 4. Unchanged encephalomalacia in the bilateral frontal lobes. Electronically Signed   By: Lesia Hausen M.D.   On: 03/10/2023 16:48   US RENAL  Result Date: 03/05/2023 CLINICAL DATA:  Acute kidney injury EXAM: RENAL / URINARY TRACT ULTRASOUND COMPLETE COMPARISON:  09/02/2022 FINDINGS: Right Kidney: Renal measurements: 11.7 x 5.8 x 4.7 cm = volume: 166 mL. Echogenicity within normal limits. No mass or hydronephrosis visualized. Multiple simple cysts are present measuring up to 8.1 cm which do not require dedicated follow-up. Left Kidney: Renal measurements: 5.7 x 6.4 x 5.1 cm = volume: 220 mL. Echogenicity within normal limits. No mass or hydronephrosis visualized. 3.9 cm simple cyst does not require further imaging follow-up. Bladder: Appears normal for degree of bladder distention. Other: None. IMPRESSION: No significant sonographic abnormality of the kidneys. Electronically Signed   By: Acquanetta Belling M.D.   On: 03/05/2023 08:02   CT Head Wo  Contrast  Result Date: 03/04/2023 CLINICAL DATA:  Mental status change, unknown cause. EXAM: CT HEAD WITHOUT CONTRAST TECHNIQUE: Contiguous axial images were obtained from the base of the skull through the vertex without intravenous contrast. RADIATION DOSE REDUCTION: This exam was performed according to the departmental dose-optimization program which includes automated exposure control, adjustment of the mA and/or kV according to patient size and/or use of iterative reconstruction technique. COMPARISON:  Head CT 09/01/2022. FINDINGS: Brain: Stable size and configuration of the shunted ventricles with unchanged right posterior approach ventricular shunt catheter with tip terminating in the anterior body of the right lateral ventricle. Small bilateral subdural collections, measuring up to 5 mm in thickness on the left and 6 mm in thickness on the right, suggestive of overshunting. Unchanged encephalomalacia in the bilateral frontal lobes. Vascular: No hyperdense vessel or unexpected calcification. Skull: Old right frontal burr hole. Sinuses/Orbits: Unremarkable. Other: None. IMPRESSION: 1. Stable size and configuration of  the shunted ventricles with unchanged right posterior approach ventricular shunt catheter with tip terminating in the anterior body of the right lateral ventricle. 2. Small bilateral subdural collections, measuring up to 5 mm in thickness on the left and 6 mm in thickness on the right, suggestive of overshunting. Electronically Signed   By: Orvan Falconer M.D.   On: 03/04/2023 14:02    Microbiology: Results for orders placed or performed during the hospital encounter of 09/01/22  Blood Culture (routine x 2)     Status: None   Collection Time: 09/01/22  6:41 PM   Specimen: BLOOD  Result Value Ref Range Status   Specimen Description BLOOD BLOOD LEFT ARM  Final   Special Requests   Final    BOTTLES DRAWN AEROBIC AND ANAEROBIC Blood Culture adequate volume   Culture   Final    NO GROWTH 5  DAYS Performed at [facility], [address] [place], [place] [address]    Report Status 09/06/2022 FINAL  Final  SARS Coronavirus 2 by RT PCR (hospital order, performed in [facility] hospital lab) *cepheid single result test* Anterior Nasal Swab     Status: None   Collection Time: 09/01/22  6:42 PM   Specimen: Anterior Nasal Swab  Result Value Ref Range Status   SARS Coronavirus 2 by RT PCR NEGATIVE NEGATIVE Final    Comment: (NOTE) SARS-CoV-2 target nucleic acids are NOT DETECTED.  The SARS-CoV-2 RNA is generally detectable in upper and lower respiratory specimens during the acute phase of infection. The lowest concentration of SARS-CoV-2 viral copies this assay can detect is 250 copies / mL. A negative result does not preclude SARS-CoV-2 infection and should not be used as the sole basis for treatment or other patient management decisions.  A negative result may occur with improper specimen collection / handling, submission of specimen other than nasopharyngeal swab, presence of viral mutation(s) within the areas targeted by this assay, and inadequate number of viral copies (<250 copies / mL). A negative result must be combined with clinical observations, patient history, and epidemiological information.  Fact Sheet for Patients:   RoadLapTop.co.za  Fact Sheet for Healthcare Providers: http://kim-miller.com/  This test is not yet approved or  cleared by the [place] FDA and has been authorized for detection and/or diagnosis of SARS-CoV-2 by FDA under an Emergency Use Authorization (EUA).  This EUA will remain in effect (meaning this test can be used) for the duration of the COVID-19 declaration under Section 564(b)(1) of the Act, 21 U.S.C. section 360bbb-3(b)(1), unless the authorization is terminated or revoked sooner.  Performed at [facility], [address] [place], [place] [address]   Culture,  blood (Routine X 2) w Reflex to ID Panel     Status: None   Collection Time: 09/03/22  2:20 AM   Specimen: BLOOD  Result Value Ref Range Status   Specimen Description BLOOD BLOOD RIGHT ARM  Final   Special Requests   Final    IN BOTH AEROBIC AND ANAEROBIC BOTTLES Blood Culture adequate volume   Culture   Final    NO GROWTH 5 DAYS Performed at [facility], [address], [place], [place] [address]    Report Status 09/08/2022 FINAL  Final    Labs: CBC: Recent Labs  Lab 03/07/23 0436 03/08/23 0411 03/09/23 0420 03/10/23 0418 03/11/23 0436  WBC 2.7* 2.6* 2.9* 2.8* 3.6*  NEUTROABS 1.2*  --   --   --   --   HGB  9.2* 9.3* 9.6* 9.7* 9.4*  HCT 29.2* 28.6* 29.9* 29.6* 29.0*  MCV 101.0* 100.7* 98.0 96.4 97.0  PLT 86* 94* 98* 96* 101*   Basic Metabolic Panel: Recent Labs  Lab 03/07/23 0436 03/08/23 0411 03/09/23 0420 03/10/23 0418 03/11/23 0436  NA 148* 146* 141 138 138  K 4.1 3.9 4.0 3.7 4.1  CL 124* 118* 112* 106 107  CO2 20* 21* 22 22 25   GLUCOSE 118* 116* 91 84 85  BUN 39* 34* 30* 31* 36*  CREATININE 1.81* 1.74* 1.65* 1.59* 1.74*  CALCIUM 8.9 8.8* 8.5* 8.7* 8.3*   Liver Function Tests: Recent Labs  Lab 03/04/23 1318 03/07/23 0436 03/10/23 0418  AST 38 31 19  ALT 14 16 10   ALKPHOS 69 67 61  BILITOT 1.5* 1.0 0.6  PROT 7.3 6.2* 6.0*  ALBUMIN 3.3* 2.5* 2.6*   CBG: Recent Labs  Lab 03/10/23 1645 03/10/23 1707 03/10/23 2138 03/11/23 0918 03/11/23 1211  GLUCAP 79 86 108* 75 108*    Discharge time spent: greater than 30 minutes.  Signed: Marcelino Duster, MD [facility] 03/11/2023

## 2023-03-11 NOTE — Progress Notes (Signed)
Physical Therapy Treatment Patient Details Name: Albert Figueroa Today's Date: 03/11/2023   History of Present Illness Albert Figueroa is a 67yoM who comes to [facility] after acute onset weakness, AMS. Pt dehydrated. Brain imaging negative for CVA. 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    PT Comments    Patient supine in bed on arrival and agreeable to therapy session. Patient required minA for bed mobility and modA to stand from EOB x 4 during session. Ambulated limited distance at bedside with min guard and RW due to patient refusing to disconnect purewick. Able to complete standing marching with B UE support with LOB x 1 and sit to stand x3 consecutively. Returned to supine with supervision and increased time/effort to complete. Discharge plan remains appropriate.      Recommendations for follow up therapy are one component of a multi-disciplinary discharge planning process, led by the attending physician.  Recommendations may be updated based on patient status, additional functional criteria and insurance authorization.  Follow Up Recommendations  Can patient physically be transported by private vehicle: Yes    Assistance Recommended at Discharge Frequent or constant Supervision/Assistance  Patient can return home with the following A little help with walking and/or transfers;A little help with bathing/dressing/bathroom;Assistance with cooking/housework;Help with stairs or ramp for entrance;Assist for transportation   Equipment Recommendations  None recommended by PT    Recommendations for Other Services       Precautions / Restrictions Precautions Precautions: Fall Restrictions Weight Bearing Restrictions: No     Mobility  Bed Mobility Overal bed mobility: Needs  Assistance Bed Mobility: Sit to Supine, Supine to Sit     Supine to sit: Min assist Sit to supine: Supervision   General bed mobility comments: assist to reposition hips towards EOB    Transfers Overall transfer level: Needs assistance Equipment used: Rolling Suvi Archuletta (2 wheels) Transfers: Sit to/from Stand Sit to Stand: Mod assist           General transfer comment: assist to come into standing from EOB. completed x 4 during session. Poor hand placement despite education    Ambulation/Gait Ambulation/Gait assistance: Min guard Gait Distance (Feet): 8 Feet Assistive device: Rolling Dekari Bures (2 wheels) Gait Pattern/deviations: Step-through pattern, Decreased stride length Gait velocity: decreased     General Gait Details: min guard for safety. Took steps fwd/bwd with slow step to gait. Refused to disconnect male purewick so distance limited   Social research officer, government Rankin (Stroke Patients Only)       Balance Overall balance assessment: Needs assistance Sitting-balance support: No upper extremity supported, Feet supported Sitting balance-Leahy Scale: Good     Standing balance support: Bilateral upper extremity supported, Reliant on assistive device for balance Standing balance-Leahy Scale: Poor                              Cognition Arousal/Alertness: Awake/alert Behavior During Therapy: WFL for tasks assessed/performed Overall Cognitive Status: History of cognitive impairments - at baseline  Exercises Other Exercises Other Exercises: standing marching x 10 with B UE support Other Exercises: sit to stand x 3    General Comments        Pertinent Vitals/Pain Pain Assessment Pain Assessment: Faces Faces Pain Scale: Hurts little more Pain Location: R knee Pain Descriptors / Indicators: Grimacing, Guarding Pain Intervention(s): Monitored during session     Home Living                          Prior Function            PT Goals (current goals can now be found in the care plan section) Acute Rehab PT Goals PT Goal Formulation: With patient Time For Goal Achievement: 03/19/23 Potential to Achieve Goals: Fair Progress towards PT goals: Progressing toward goals    Frequency    Min 3X/week      PT Plan Current plan remains appropriate    Co-evaluation              AM-PAC PT "6 Clicks" Mobility   Outcome Measure  Help needed turning from your back to your side while in a flat bed without using bedrails?: A Little Help needed moving from lying on your back to sitting on the side of a flat bed without using bedrails?: A Little Help needed moving to and from a bed to a chair (including a wheelchair)?: A Little Help needed standing up from a chair using your arms (e.g., wheelchair or bedside chair)?: A Lot Help needed to walk in hospital room?: A Little Help needed climbing 3-5 steps with a railing? : Total 6 Click Score: 15    End of Session Equipment Utilized During Treatment: Gait belt Activity Tolerance: Patient tolerated treatment well Patient left: in bed;with call bell/phone within reach;with bed alarm set Nurse Communication: Mobility status PT Visit Diagnosis: Difficulty in walking, not elsewhere classified (R26.2);Other abnormalities of gait and mobility (R26.89);Repeated falls (R29.6);Muscle weakness (generalized) (M62.81)     Time: 4782-9562 PT Time Calculation (min) (ACUTE ONLY): 14 min  Charges:  $Therapeutic Activity: 8-22 mins                     [name], PT, DPT Physical Therapist - [facility]  [facility]    Gabrielle Wakeland A Stellar Gensel 03/11/2023, 4:10 PM

## 2023-03-11 NOTE — Plan of Care (Signed)
Family notified of d/c report call to SNF. Waiting on EMS.  Problem: Education: Goal: Ability to describe self-care measures that may prevent or decrease complications (Diabetes Survival Skills Education) will improve 03/11/2023 1951 by Murriel Hopper, Orlie Pore, RN Outcome: Not Applicable 03/11/2023 1215 by Murriel Hopper, Bobbie Pore, RN Outcome: Progressing Goal: Individualized Educational Video(s) 03/11/2023 1951 by Murriel Hopper, Dantonio Pore, RN Outcome: Not Applicable 03/11/2023 1215 by Murriel Hopper, Rex Pore, RN Outcome: Progressing   Problem: Coping: Goal: Ability to adjust to condition or change in health will improve 03/11/2023 1951 by Monica Becton, RN Outcome: Not Applicable 03/11/2023 1215 by Murriel Hopper, Cebert Pore, RN Outcome: Progressing   Problem: Fluid Volume: Goal: Ability to maintain a balanced intake and output will improve 03/11/2023 1951 by Monica Becton, RN Outcome: Not Applicable 03/11/2023 1215 by Murriel Hopper, Nayden Pore, RN Outcome: Progressing   Problem: Health Behavior/Discharge Planning: Goal: Ability to identify and utilize available resources and services will improve 03/11/2023 1951 by Monica Becton, RN Outcome: Not Applicable 03/11/2023 1215 by Murriel Hopper, Manoah Pore, RN Outcome: Progressing Goal: Ability to manage health-related needs will improve 03/11/2023 1951 by Monica Becton, RN Outcome: Not Applicable 03/11/2023 1215 by Murriel Hopper, Lasaro Pore, RN Outcome: Progressing   Problem: Nutritional: Goal: Maintenance of adequate nutrition will improve 03/11/2023 1951 by Monica Becton, RN Outcome: Not Applicable 03/11/2023 1215 by Murriel Hopper, Acheron Pore, RN Outcome: Progressing Goal: Progress toward achieving an optimal weight will improve 03/11/2023 1951 by Monica Becton, RN Outcome: Not Applicable 03/11/2023 1215 by Murriel Hopper, Kotaro Pore, RN Outcome: Progressing   Problem: Metabolic: Goal:  Ability to maintain appropriate glucose levels will improve 03/11/2023 1951 by Monica Becton, RN Outcome: Not Applicable 03/11/2023 1215 by Murriel Hopper, Alfonse Pore, RN Outcome: Progressing   Problem: Education: Goal: Knowledge of General Education information will improve Description: Including pain rating scale, medication(s)/side effects and non-pharmacologic comfort measures 03/11/2023 1951 by Monica Becton, RN Outcome: Not Applicable 03/11/2023 1215 by Murriel Hopper, Camden Pore, RN Outcome: Progressing   Problem: Health Behavior/Discharge Planning: Goal: Ability to manage health-related needs will improve 03/11/2023 1951 by Monica Becton, RN Outcome: Not Applicable 03/11/2023 1215 by Murriel Hopper, Ryelan Pore, RN Outcome: Progressing   Problem: Elimination: Goal: Will not experience complications related to bowel motility 03/11/2023 1951 by Monica Becton, RN Outcome: Not Applicable 03/11/2023 1215 by Murriel Hopper, Aryon Pore, RN Outcome: Progressing Goal: Will not experience complications related to urinary retention 03/11/2023 1951 by Monica Becton, RN Outcome: Not Applicable 03/11/2023 1215 by Monica Becton, RN Outcome: Progressing

## 2023-03-11 NOTE — TOC Transition Note (Signed)
Transition of Care [facility]) - CM/SW Discharge Note   Patient Details  Name: Albert Figueroa MRN: [id number] Date of Birth: 03-11-1956  Transition of Care [facility]) CM/SW Contact:  Kreg Shropshire, RN Phone Number: 03/11/2023, 2:09 PM   Clinical Narrative:    Received D/c order. Cm sent [facility] Trans request and discharge summary via hub. Non emergency EMS called and pt 5th in line for non emergent transport to facility. Called Cindy (significant other) on update on pt d/c. Appropriate staff notified   Final next level of care: Skilled Nursing Facility ([facility] [place]) Barriers to Discharge: Continued Medical Work up   Patient Goals and CMS Choice CMS Medicare.gov Compare Post Acute Care list provided to:: Patient Choice offered to / list presented to : Patient  Discharge Placement                  Patient to be transferred to facility by: non emergent EMS Name of family member notified: Cindy Signifcant Other Patient and family notified of of transfer: 03/11/23  Discharge Plan and Services Additional resources added to the After Visit Summary for       Post Acute Care Choice: Skilled Nursing Facility                               Social Determinants of Health (SDOH) Interventions SDOH Screenings   Food Insecurity: No Food Insecurity (03/04/2023)  Housing: Low Risk  (03/04/2023)  Transportation Needs: No Transportation Needs (03/04/2023)  Utilities: Not At Risk (03/04/2023)  Tobacco Use: High Risk (03/04/2023)     Readmission Risk Interventions    03/06/2023    3:23 PM  Readmission Risk Prevention Plan  Transportation Screening Complete  PCP or Specialist Appt within 3-5 Days Complete  Social Work Consult for Recovery Care Planning/Counseling Complete  Palliative Care Screening Not Applicable  Medication Review [employer]) Complete

## 2023-03-11 NOTE — Plan of Care (Signed)
Discharge appropriate.... Resolving plan of care.  Albert Figueroa Albert Figueroa

## 2023-03-11 NOTE — Plan of Care (Signed)
Call bell at reach and bed alarm in place. Problem: Education: Goal: Ability to describe self-care measures that may prevent or decrease complications (Diabetes Survival Skills Education) will improve Outcome: Progressing Goal: Individualized Educational Video(s) Outcome: Progressing   Problem: Coping: Goal: Ability to adjust to condition or change in health will improve Outcome: Progressing   Problem: Fluid Volume: Goal: Ability to maintain a balanced intake and output will improve Outcome: Progressing   Problem: Fluid Volume: Goal: Ability to maintain a balanced intake and output will improve Outcome: Progressing   Problem: Health Behavior/Discharge Planning: Goal: Ability to identify and utilize available resources and services will improve Outcome: Progressing Goal: Ability to manage health-related needs will improve Outcome: Progressing   Problem: Nutritional: Goal: Maintenance of adequate nutrition will improve Outcome: Progressing Goal: Progress toward achieving an optimal weight will improve Outcome: Progressing   Problem: Skin Integrity: Goal: Risk for impaired skin integrity will decrease Outcome: Progressing

## 2023-03-26 ENCOUNTER — Inpatient Hospital Stay: Payer: 59 | Attending: Internal Medicine | Admitting: Internal Medicine

## 2023-03-26 ENCOUNTER — Inpatient Hospital Stay: Payer: 59

## 2023-03-26 NOTE — Assessment & Plan Note (Deleted)
----  #   Severe pancytopenia-unclear etiology.  However suspect benign causes-medication induced/methotrexate; /underlying infection/DIC.  Less likely suggestive of MA HA syndrome; or any acute malignancy. Review of peripheral smear-shows rare schistocytes without any immature blood cells.  Discussed with pathology Dr. Oneita Kras.  Folic acid is low; B12-pending.    # PT PTT-mildly elevated secondary to DIC/infection/malnutrition.  If it does not improve -recommend mixing workup.  Check fibrinogen levels LDH.  # History of gastric bypass-currently inflammatory markers are elevated.  Awaiting B12.   # Recommendations/plan:  # Given the possible underlying infection/colitis would recommend starting patient on Granix.  Continue PRBC transfusion/platelet transfusion.  Blood transfusion-to keep the hemoglobin > 7-8.  Platelet transfusion- for Less than 10,000 if asymptomatic/no bleeding.  However transfuse platelets if ongoing bleeding if less than 20K.   # At length I discussed the possible need for a bone marrow biopsy if above work-up is unrevealing; all labs are progressively getting worse.    Thank you Dr. for allowing me to participate in the care of your pleasant patient. Please do not hesitate to contact me with questions or concerns in the interim.

## 2023-03-26 NOTE — Progress Notes (Signed)
Acute toxic encephalopathy due to oxycodone

## 2023-03-26 NOTE — Progress Notes (Deleted)
Cancer Center CONSULT NOTE  Patient Care Team: Marisue Ivan, MD as PCP - General (Family Medicine)  CHIEF COMPLAINTS/PURPOSE OF CONSULTATION: Pancytopenia   HEMATOLOGY HISTORY  # PANCYTOPENIA [platelets-  WBC-  ANC-; Hb- MCV- HIV/Hepatitis: Alcohol; CT: [place]:   HISTORY OF PRESENTING ILLNESS:  Albert Figueroa 67 y.o.  male pleasant patient was been referred to [place] for further evaluation of pancytopenia.   No prior history of any hematologic disorder/or prior bone marrow biopsies.   Alcohol: Hepatitis: HIV: Auto-immune disease: Herbal medications/ new medications: Hx of malignancy: CT scan: [place]:    # PANCYTOPENIA-[20**] hemoglobin ; leukopenia [Neutrophils:; Lymphocytes:]; platelets 143.  The etiology is unclear.   #I reviewed with the patient and family the multiple causes of pancytopenia include-benign causes like liver disease/hypersplenism; vitamin deficiencies--including B 12 folic acid; viral infections-HIV/hepatitis; autoimmune causes; medications etc. I also reviewed malignant causes include: leukemias-MDS/lymphomas  and rare infiltration of the bone marrow with carcinomas.    # Recommend blood work-CBC CMP LDH hepatitis panel;HIV; iron studies ferritin; reticulocyte count review of peripheral smear; B12 folate acid.  Discussed the possible need for a bone marrow biopsy if above work-up is unrevealing; all labs are progressively getting worse.  Also discussed regarding imaging with ultrasound.   Thank you Dr/Ms. NP  for allowing me to participate in the care of your pleasant patient. Please do not hesitate to contact me with questions or concerns in the interim.  # DISPOSITION: # labs today- ordered # US abdomen # follow up in 2-3 weeks; MD; No labs- Dr.B ---------------------------------------------------------------------      Alcohol: Hepatitis: HIV: Auto-immune disease: New medications: Hx of malignancy: CT scan: In the emergency room patient  had a CT scan : **  Otherwise no splenomegaly or hepatomegaly.  [place]:   Otherwise renal function/creatinine normal. No prior bone marrow biopsies.       Review of Systems  Constitutional:  Negative for chills, diaphoresis, fever, malaise/fatigue and weight loss.  HENT:  Negative for nosebleeds and sore throat.   Eyes:  Negative for double vision.  Respiratory:  Negative for cough, hemoptysis, sputum production, shortness of breath and wheezing.   Cardiovascular:  Negative for chest pain, palpitations, orthopnea and leg swelling.  Gastrointestinal:  Negative for abdominal pain, blood in stool, constipation, diarrhea, heartburn, melena, nausea and vomiting.  Genitourinary:  Negative for dysuria, frequency and urgency.  Musculoskeletal:  Negative for back pain and joint pain.  Skin: Negative.  Negative for itching and rash.  Neurological:  Negative for dizziness, tingling, focal weakness, weakness and headaches.  Endo/Heme/Allergies:  Does not bruise/bleed easily.  Psychiatric/Behavioral:  Negative for depression. The patient is not nervous/anxious and does not have insomnia.      MEDICAL HISTORY:  Past Medical History:  Diagnosis Date   Arthritis    Diabetes mellitus without complication (HCC)    Emphysema/COPD (HCC)    Hyperlipidemia    Hypertension    Kidney disease    stage 3   Lumbar stenosis    Osteoarthrosis    Seizure (HCC)     SURGICAL HISTORY: Past Surgical History:  Procedure Laterality Date   KNEE SURGERY     multiple surgeries on both knees    SOCIAL HISTORY: Social History   Socioeconomic History   Marital status: Single    Spouse name: Not on file   Number of children: Not on file   Years of education: Not on file   Highest education level: Not on file  Occupational History  Not on file  Tobacco Use   Smoking status: Every Day    Packs/day: 2.00    Years: 48.00    Additional pack years: 0.00    Total pack years: 96.00    Types: Cigarettes    Smokeless tobacco: Never   Tobacco comments:    has cut back to 1 PPD  Vaping Use   Vaping Use: Never used  Substance and Sexual Activity   Alcohol use: No   Drug use: No   Sexual activity: Yes    Partners: Female  Other Topics Concern   Not on file  Social History Narrative   Not on file   Social Determinants of Health   Financial Resource Strain: Not on file  Food Insecurity: No Food Insecurity (03/04/2023)   Hunger Vital Sign    Worried About Running Out of Food in the Last Year: Never true    Ran Out of Food in the Last Year: Never true  Transportation Needs: No Transportation Needs (03/04/2023)   PRAPARE - Administrator, Civil Service (Medical): No    Lack of Transportation (Non-Medical): No  Physical Activity: Not on file  Stress: Not on file  Social Connections: Not on file  Intimate Partner Violence: Not At Risk (03/04/2023)   Humiliation, Afraid, Rape, and Kick questionnaire    Fear of Current or Ex-Partner: No    Emotionally Abused: No    Physically Abused: No    Sexually Abused: No    FAMILY HISTORY: Family History  Problem Relation Age of Onset   Diabetes Mother    Cancer Mother     ALLERGIES:  is allergic to gabapentin and amlodipine.  MEDICATIONS:  Current Outpatient Medications  Medication Sig Dispense Refill   acetaminophen (TYLENOL) 325 MG tablet Take 2 tablets (650 mg total) by mouth every 6 (six) hours as needed for mild pain (or Fever >/= 101). (Patient not taking: Reported on 03/04/2023)     azelastine (OPTIVAR) 0.05 % ophthalmic solution Place 1 drop into both eyes 2 (two) times daily. (Patient not taking: Reported on 03/04/2023)     COMBIVENT RESPIMAT 20-100 MCG/ACT AERS respimat Inhale 1 puff into the lungs 4 (four) times daily.     dexlansoprazole (DEXILANT) 60 MG capsule Take 60 mg by mouth daily.     divalproex (DEPAKOTE ER) 500 MG 24 hr tablet Take 1,000 mg by mouth in the morning and at bedtime.     erythromycin ophthalmic ointment  Place 1 Application into both eyes at bedtime. (Patient not taking: Reported on 03/04/2023)     fenofibrate 160 MG tablet Take 160 mg by mouth daily.     furosemide (LASIX) 20 MG tablet Take 10 mg by mouth every other day.     lidocaine (LIDODERM) 5 % Place 1 patch onto the skin daily. (Patient not taking: Reported on 03/04/2023)     naloxone [facility]) nasal spray 4 mg/0.1 mL Place 1 spray into the nose once.     Oxycodone HCl 20 MG TABS Take 1 tablet (20 mg total) by mouth every 8 (eight) hours as needed. 15 tablet 0   Roflumilast (DALIRESP) 250 MCG TABS Take 250 mcg by mouth at bedtime. (Patient not taking: Reported on 03/04/2023)     rosuvastatin (CRESTOR) 20 MG tablet Take 20 mg by mouth at bedtime.     No current facility-administered medications for this visit.     PHYSICAL EXAMINATION:   There were no vitals filed for this visit. There were  no vitals filed for this visit.  Physical Exam Vitals and nursing note reviewed.  HENT:     Head: Normocephalic and atraumatic.     Mouth/Throat:     Pharynx: Oropharynx is clear.  Eyes:     Extraocular Movements: Extraocular movements intact.     Pupils: Pupils are equal, round, and reactive to light.  Cardiovascular:     Rate and Rhythm: Normal rate and regular rhythm.  Pulmonary:     Comments: Decreased breath sounds bilaterally.  Abdominal:     Palpations: Abdomen is soft.  Musculoskeletal:        General: Normal range of motion.     Cervical back: Normal range of motion.  Skin:    General: Skin is warm.  Neurological:     General: No focal deficit present.     Mental Status: He is alert and oriented to person, place, and time.  Psychiatric:        Behavior: Behavior normal.        Judgment: Judgment normal.      LABORATORY DATA:  I have reviewed the data as listed Lab Results  Component Value Date   WBC 3.6 (L) 03/11/2023   HGB 9.4 (L) 03/11/2023   HCT 29.0 (L) 03/11/2023   MCV 97.0 03/11/2023   PLT 101 (L) 03/11/2023    Recent Labs    07/05/22 0215 07/05/22 0216 03/04/23 1318 03/05/23 1610 03/07/23 0436 03/08/23 0411 03/09/23 0420 03/10/23 0418 03/11/23 0436  NA  --    < > 144   < > 148*   < > 141 138 138  K  --    < > 4.2   < > 4.1   < > 4.0 3.7 4.1  CL  --    < > 118*   < > 124*   < > 112* 106 107  CO2  --    < > 12*   < > 20*   < > 22 22 25   GLUCOSE  --    < > 109*   < > 118*   < > 91 84 85  BUN  --    < > 110*   < > 39*   < > 30* 31* 36*  CREATININE  --    < > 3.61*   < > 1.81*   < > 1.65* 1.59* 1.74*  CALCIUM  --    < > 8.7*   < > 8.9   < > 8.5* 8.7* 8.3*  GFRNONAA  --    < > 18*   < > 41*   < > 46* 48* 43*  PROT 8.1   < > 7.3  --  6.2*  --   --  6.0*  --   ALBUMIN 3.3*   < > 3.3*  --  2.5*  --   --  2.6*  --   AST 27   < > 38  --  31  --   --  19  --   ALT 12   < > 14  --  16  --   --  10  --   ALKPHOS 71   < > 69  --  67  --   --  61  --   BILITOT 0.9   < > 1.5*  --  1.0  --   --  0.6  --   BILIDIR 0.3*  --   --   --   --   --   --  0.2  --   IBILI 0.6  --   --   --   --   --   --  0.4  --    < > = values in this interval not displayed.     EEG adult  Result Date: 03/10/2023 Charlsie Quest, MD     03/10/2023  4:51 PM Patient Name: BERISH BOHMAN MRN: [id number] Epilepsy Attending: Charlsie Quest Referring Physician/Provider: Kathrynn Running, MD Date: 03/10/2023 Duration: 35.41 mins Patient history: 67yo M with ams getting eeg to evaluate for seizure. Level of alertness: Awake AEDs during EEG study: VPA Technical aspects: This EEG study was done with scalp electrodes positioned according to the 10-20 International system of electrode placement. Electrical activity was reviewed with band pass filter of 1-70Hz , sensitivity of 7 uV/mm, display speed of 81mm/sec with a 60Hz  notched filter applied as appropriate. EEG data were recorded continuously and digitally stored.  Video monitoring was available and reviewed as appropriate. Description: EEG showed continuous generalized 5 to 6 Hz  theta slowing. Hyperventilation and photic stimulation were not performed.   ABNORMALITY - Continuous slow, generalized IMPRESSION: This study is suggestive of moderate diffuse encephalopathy, nonspecific etiology. No seizures or epileptiform discharges were seen throughout the recording. Charlsie Quest   MR BRAIN WO CONTRAST  Result Date: 03/10/2023 CLINICAL DATA:  Presents with altered mental status. EXAM: MRI HEAD WITHOUT CONTRAST TECHNIQUE: Multiplanar, multiecho pulse sequences of the brain and surrounding structures were obtained without intravenous contrast. COMPARISON:  CT head 03/04/2023, brain MRI 06/15/2021 FINDINGS: Axial T1 and coronal T2 images were not obtained. Brain: There is no acute intracranial hemorrhage, acute extra-axial fluid collection, or acute infarct. Background parenchymal volume is normal. A right ventricular catheter is in place terminating in the body of the right lateral ventricle. The ventricles are stable in size compared to the head CT from 03/04/2023 and brain MRI from 2022. Bilateral subdural collections measuring up to approximately 5 mm in thickness are unchanged since the recent prior head CT and were present on the brain MRI from 2022. Encephalomalacia in the bilateral frontal lobes with chronic blood products on the right is unchanged. The pituitary and suprasellar region are normal. There is no mass lesion. There is no mass effect or midline shift. Vascular: Normal flow voids. Skull and upper cervical spine: Normal marrow signal. Sinuses/Orbits: The paranasal sinuses are clear. The globes and orbits are unremarkable. Other: None. IMPRESSION: 1. No acute intracranial pathology. 2. Stable right parietal approach ventricular catheter with unchanged size and configuration of the ventricles. 3. 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 While this can be seen in the setting of over shunting, the collections are present on the brain MRI from 2022 and the  ventricles are unchanged in size since that study. 4. Unchanged encephalomalacia in the bilateral frontal lobes. Electronically Signed   By: Lesia Hausen M.D.   On: 03/10/2023 16:48   US RENAL  Result Date: 03/05/2023 CLINICAL DATA:  Acute kidney injury EXAM: RENAL / URINARY TRACT ULTRASOUND COMPLETE COMPARISON:  09/02/2022 FINDINGS: Right Kidney: Renal measurements: 11.7 x 5.8 x 4.7 cm = volume: 166 mL. Echogenicity within normal limits. No mass or hydronephrosis visualized. Multiple simple cysts are present measuring up to 8.1 cm which do not require dedicated follow-up. Left Kidney: Renal measurements: 5.7 x 6.4 x 5.1 cm = volume: 220 mL. Echogenicity within normal limits. No mass or hydronephrosis visualized. 3.9 cm simple cyst does not require further imaging follow-up. Bladder: Appears  normal for degree of bladder distention. Other: None. IMPRESSION: No significant sonographic abnormality of the kidneys. Electronically Signed   By: Acquanetta Belling M.D.   On: 03/05/2023 08:02   CT Head Wo Contrast  Result Date: 03/04/2023 CLINICAL DATA:  Mental status change, unknown cause. EXAM: CT HEAD WITHOUT CONTRAST TECHNIQUE: Contiguous axial images were obtained from the base of the skull through the vertex without intravenous contrast. RADIATION DOSE REDUCTION: This exam was performed according to the departmental dose-optimization program which includes automated exposure control, adjustment of the mA and/or kV according to patient size and/or use of iterative reconstruction technique. COMPARISON:  Head CT 09/01/2022. FINDINGS: Brain: Stable size and configuration of the shunted ventricles with unchanged right posterior approach ventricular shunt catheter with tip terminating in the anterior body of the right lateral ventricle. Small bilateral subdural collections, measuring up to 5 mm in thickness on the left and 6 mm in thickness on the right, suggestive of overshunting. Unchanged encephalomalacia in the bilateral  frontal lobes. Vascular: No hyperdense vessel or unexpected calcification. Skull: Old right frontal burr hole. Sinuses/Orbits: Unremarkable. Other: None. IMPRESSION: 1. Stable size and configuration of the shunted ventricles with unchanged right posterior approach ventricular shunt catheter with tip terminating in the anterior body of the right lateral ventricle. 2. Small bilateral subdural collections, measuring up to 5 mm in thickness on the left and 6 mm in thickness on the right, suggestive of overshunting. Electronically Signed   By: Orvan Falconer M.D.   On: 03/04/2023 14:02    ASSESSMENT & PLAN:   No problem-specific Assessment & Plan notes found for this encounter.    All questions were answered. The patient knows to call the clinic with any problems, questions or concerns.    Earna Coder, MD 03/26/2023 1:30 PM

## 2023-03-31 ENCOUNTER — Encounter: Payer: Self-pay | Admitting: Internal Medicine

## 2023-04-25 ENCOUNTER — Other Ambulatory Visit: Payer: Self-pay | Admitting: Anesthesiology

## 2023-04-25 DIAGNOSIS — M5030 Other cervical disc degeneration, unspecified cervical region: Secondary | ICD-10-CM

## 2023-05-08 ENCOUNTER — Ambulatory Visit: Payer: 59

## 2023-05-09 ENCOUNTER — Ambulatory Visit: Admission: RE | Admit: 2023-05-09 | Payer: 59 | Source: Ambulatory Visit

## 2023-05-09 DIAGNOSIS — M5030 Other cervical disc degeneration, unspecified cervical region: Secondary | ICD-10-CM

## 2023-05-12 ENCOUNTER — Encounter: Payer: Self-pay | Admitting: Ophthalmology

## 2023-05-12 NOTE — Discharge Instructions (Signed)

## 2023-05-12 NOTE — Anesthesia Preprocedure Evaluation (Addendum)
Anesthesia Evaluation  Patient identified by MRN, date of birth, ID band Patient awake    Reviewed: Allergy & Precautions, H&P , NPO status , Patient's Chart, lab work & pertinent test results  Airway Mallampati: IV  TM Distance: >3 FB Neck ROM: Full  Mouth opening: Limited Mouth Opening  Dental no notable dental hx. (+) Edentulous Upper, Edentulous Lower   Pulmonary neg pulmonary ROS, COPD, Current Smoker and Patient abstained from smoking.   Pulmonary exam normal breath sounds clear to auscultation       Cardiovascular hypertension, negative cardio ROS Normal cardiovascular exam Rhythm:Regular Rate:Normal     Neuro/Psych Seizures -,  PSYCHIATRIC DISORDERS Anxiety Depression   Dementia negative neurological ROS  negative psych ROS   GI/Hepatic negative GI ROS, Neg liver ROS,GERD  ,,  Endo/Other  negative endocrine ROSdiabetes    Renal/GU Renal diseasenegative Renal ROS  negative genitourinary   Musculoskeletal negative musculoskeletal ROS (+) Arthritis ,    Abdominal   Peds negative pediatric ROS (+)  Hematology negative hematology ROS (+) Blood dyscrasia, anemia   Anesthesia Other Findings Hypertension  Diabetes mellitus without complication  Tremor both hands Arthritis  Seizure (HCC) Osteoarthrosis Hyperlipidemia Kidney disease  Lumbar stenosis Emphysema/COPD (HCC)  Epilepsy (HCC) Meningitis spinal  History of brain shunt Lewy body dementia, poor short-term memory, but can remember further back  Family member states patient "moves a lot" and patient agrees.  I instructed patient to please try to speak up if he has to move; discussed w/surgeon.     Reproductive/Obstetrics negative OB ROS                             Anesthesia Physical Anesthesia Plan  ASA: 4  Anesthesia Plan: MAC   Post-op Pain Management:    Induction: Intravenous  PONV Risk Score and Plan:   Airway  Management Planned: Natural Airway and Nasal Cannula  Additional Equipment:   Intra-op Plan:   Post-operative Plan:   Informed Consent: I have reviewed the patients History and Physical, chart, labs and discussed the procedure including the risks, benefits and alternatives for the proposed anesthesia with the patient or authorized representative who has indicated his/her understanding and acceptance.     Dental Advisory Given  Plan Discussed with: Anesthesiologist, CRNA and Surgeon  Anesthesia Plan Comments: (Patient consented for risks of anesthesia including but not limited to:  - adverse reactions to medications - damage to eyes, teeth, lips or other oral mucosa - nerve damage due to positioning  - sore throat or hoarseness - Damage to heart, brain, nerves, lungs, other parts of body or loss of life  Patient voiced understanding.)       Anesthesia Quick Evaluation

## 2023-05-13 DIAGNOSIS — F02B0 Dementia in other diseases classified elsewhere, moderate, without behavioral disturbance, psychotic disturbance, mood disturbance, and anxiety: Secondary | ICD-10-CM | POA: Diagnosis present

## 2023-05-14 ENCOUNTER — Encounter: Payer: Self-pay | Admitting: Ophthalmology

## 2023-05-14 ENCOUNTER — Ambulatory Visit: Payer: 59 | Admitting: Anesthesiology

## 2023-05-14 ENCOUNTER — Ambulatory Visit
Admission: RE | Admit: 2023-05-14 | Discharge: 2023-05-14 | Disposition: A | Discharge status: Home / Self Care | Payer: 59 | Attending: Ophthalmology | Admitting: Ophthalmology

## 2023-05-14 ENCOUNTER — Ambulatory Visit: Payer: Self-pay | Admitting: Anesthesiology

## 2023-05-14 ENCOUNTER — Encounter
Admission: RE | Disposition: A | Discharge status: Home / Self Care | Payer: Self-pay | Source: Home / Self Care | Attending: Ophthalmology

## 2023-05-14 ENCOUNTER — Other Ambulatory Visit: Payer: Self-pay

## 2023-05-14 DIAGNOSIS — E1136 Type 2 diabetes mellitus with diabetic cataract: Secondary | ICD-10-CM | POA: Diagnosis present

## 2023-05-14 DIAGNOSIS — Z7984 Long term (current) use of oral hypoglycemic drugs: Secondary | ICD-10-CM | POA: Diagnosis not present

## 2023-05-14 DIAGNOSIS — J449 Chronic obstructive pulmonary disease, unspecified: Secondary | ICD-10-CM | POA: Insufficient documentation

## 2023-05-14 DIAGNOSIS — F17210 Nicotine dependence, cigarettes, uncomplicated: Secondary | ICD-10-CM | POA: Insufficient documentation

## 2023-05-14 DIAGNOSIS — N184 Chronic kidney disease, stage 4 (severe): Secondary | ICD-10-CM | POA: Diagnosis not present

## 2023-05-14 DIAGNOSIS — I129 Hypertensive chronic kidney disease with stage 1 through stage 4 chronic kidney disease, or unspecified chronic kidney disease: Secondary | ICD-10-CM | POA: Insufficient documentation

## 2023-05-14 DIAGNOSIS — H2511 Age-related nuclear cataract, right eye: Secondary | ICD-10-CM | POA: Insufficient documentation

## 2023-05-14 DIAGNOSIS — F0280 Dementia in other diseases classified elsewhere without behavioral disturbance: Secondary | ICD-10-CM | POA: Insufficient documentation

## 2023-05-14 DIAGNOSIS — E119 Type 2 diabetes mellitus without complications: Secondary | ICD-10-CM | POA: Insufficient documentation

## 2023-05-14 DIAGNOSIS — Z833 Family history of diabetes mellitus: Secondary | ICD-10-CM | POA: Insufficient documentation

## 2023-05-14 DIAGNOSIS — F418 Other specified anxiety disorders: Secondary | ICD-10-CM | POA: Insufficient documentation

## 2023-05-14 DIAGNOSIS — M1990 Unspecified osteoarthritis, unspecified site: Secondary | ICD-10-CM | POA: Diagnosis not present

## 2023-05-14 DIAGNOSIS — E1122 Type 2 diabetes mellitus with diabetic chronic kidney disease: Secondary | ICD-10-CM | POA: Insufficient documentation

## 2023-05-14 HISTORY — DX: Tremor, unspecified: R25.1

## 2023-05-14 HISTORY — DX: Chronic kidney disease, stage 4 (severe): N18.4

## 2023-05-14 HISTORY — DX: Dementia in other diseases classified elsewhere, unspecified severity, without behavioral disturbance, psychotic disturbance, mood disturbance, and anxiety: F02.80

## 2023-05-14 HISTORY — PX: CATARACT EXTRACTION W/PHACO: SHX586

## 2023-05-14 HISTORY — DX: Epilepsy, unspecified, not intractable, without status epilepticus: G40.909

## 2023-05-14 HISTORY — DX: Presence of cerebrospinal fluid drainage device: Z98.2

## 2023-05-14 HISTORY — DX: Meningitis, unspecified: G03.9

## 2023-05-14 SURGERY — PHACOEMULSIFICATION, CATARACT, WITH IOL INSERTION
Anesthesia: Monitor Anesthesia Care | Site: Eye | Laterality: Right

## 2023-05-14 MED ORDER — TETRACAINE HCL 0.5 % OP SOLN
1.0000 [drp] | OPHTHALMIC | Status: DC | PRN
  Administered 2023-05-14 (×3): 1 [drp] via OPHTHALMIC

## 2023-05-14 MED ORDER — SIGHTPATH DOSE#1 BSS IO SOLN
INTRAOCULAR | Status: DC | PRN
  Administered 2023-05-14: 1 mL

## 2023-05-14 MED ORDER — MIDAZOLAM HCL 2 MG/2ML IJ SOLN
INTRAMUSCULAR | Status: DC | PRN
  Administered 2023-05-14: 1 mg via INTRAVENOUS

## 2023-05-14 MED ORDER — ARMC OPHTHALMIC DILATING DROPS
1.0000 | OPHTHALMIC | Status: DC | PRN
  Administered 2023-05-14 (×3): 1 via OPHTHALMIC

## 2023-05-14 MED ORDER — SIGHTPATH DOSE#1 BSS IO SOLN
INTRAOCULAR | Status: DC | PRN
  Administered 2023-05-14: 72 mL via OPHTHALMIC

## 2023-05-14 MED ORDER — SIGHTPATH DOSE#1 BSS IO SOLN
INTRAOCULAR | Status: DC | PRN
  Administered 2023-05-14: 15 mL

## 2023-05-14 MED ORDER — LACTATED RINGERS IV SOLN: INTRAVENOUS | Status: DC

## 2023-05-14 MED ORDER — CEFUROXIME OPHTHALMIC INJECTION 1 MG/0.1 ML
INJECTION | OPHTHALMIC | Status: DC | PRN
  Administered 2023-05-14: .1 mL via INTRACAMERAL

## 2023-05-14 MED ORDER — SIGHTPATH DOSE#1 NA HYALUR & NA CHOND-NA HYALUR IO KIT
PACK | INTRAOCULAR | Status: DC | PRN
  Administered 2023-05-14: 1 via OPHTHALMIC

## 2023-05-14 MED ORDER — FENTANYL CITRATE (PF) 100 MCG/2ML IJ SOLN
INTRAMUSCULAR | Status: DC | PRN
  Administered 2023-05-14: 50 ug via INTRAVENOUS

## 2023-05-14 MED ORDER — BRIMONIDINE TARTRATE-TIMOLOL 0.2-0.5 % OP SOLN
OPHTHALMIC | Status: DC | PRN
  Administered 2023-05-14: 1 [drp] via OPHTHALMIC

## 2023-05-14 SURGICAL SUPPLY — 18 items
CANNULA ANT/CHMB 27G (MISCELLANEOUS) IMPLANT
CANNULA ANT/CHMB 27GA (MISCELLANEOUS)
CATARACT SUITE SIGHTPATH (MISCELLANEOUS) ×1
FEE CATARACT SUITE SIGHTPATH (MISCELLANEOUS) ×1 IMPLANT
GLOVE SRG 8 PF TXTR STRL LF DI (GLOVE) ×1 IMPLANT
GLOVE SURG ENC TEXT LTX SZ7.5 (GLOVE) ×1 IMPLANT
GLOVE SURG GAMMEX PI TX LF 7.5 (GLOVE) IMPLANT
GLOVE SURG UNDER POLY LF SZ8 (GLOVE) ×1
LENS IOL TECNIS EYHANCE 17.0 (Intraocular Lens) IMPLANT
NDL FILTER BLUNT 18X1 1/2 (NEEDLE) ×1 IMPLANT
NDL RETROBULBAR .5 NSTRL (NEEDLE) IMPLANT
NEEDLE FILTER BLUNT 18X1 1/2 (NEEDLE) ×1 IMPLANT
PACK VIT ANT 23G (MISCELLANEOUS) IMPLANT
RING MALYGIN 7.0 (MISCELLANEOUS) IMPLANT
SUT ETHILON 10-0 CS-B-6CS-B-6 (SUTURE)
SUT VICRYL 9 0 (SUTURE) IMPLANT
SUTURE EHLN 10-0 CS-B-6CS-B-6 (SUTURE) IMPLANT
SYR 3ML LL SCALE MARK (SYRINGE) ×1 IMPLANT

## 2023-05-14 NOTE — H&P (Signed)
[facility]   Primary Care Physician:  Marisue Ivan, MD Ophthalmologist: Dr. Lockie Mola  Pre-Procedure History & Physical: HPI:  Albert Figueroa is a 67 y.o. male here for ophthalmic surgery.   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 Authorizing Provider  albuterol (VENTOLIN HFA) 108 (90 Base) MCG/ACT inhaler Inhale into the lungs every 6 (six) hours as needed for wheezing or shortness of breath.   Yes [provider]  dapagliflozin propanediol (FARXIGA) 10 MG TABS tablet Take 10 mg by mouth daily.   Yes [provider]  dexlansoprazole (DEXILANT) 60 MG capsule Take 60 mg by mouth daily.   Yes [provider]  divalproex (DEPAKOTE ER) 500 MG 24 hr tablet Take 1,000 mg by mouth in the morning and at bedtime.   Yes [provider]  donepezil (ARICEPT ODT) 10 MG disintegrating tablet Take 10 mg by mouth at bedtime.   Yes [provider]  fenofibrate 160 MG tablet Take 160 mg by mouth daily. 04/25/22  Yes [provider]  Fluticasone-Umeclidin-Vilant (TRELEGY ELLIPTA) 100-62.5-25 MCG/ACT AEPB Inhale into the lungs daily.   Yes [provider]  furosemide (LASIX) 20 MG tablet Take 10  mg by mouth as needed. 01/31/23  Yes [provider]  hydrOXYzine (ATARAX) 10 MG tablet Take 10 mg by mouth 3 (three) times daily as needed.   Yes [provider]  lisinopril (ZESTRIL) 20 MG tablet Take 20 mg by mouth daily.   Yes [provider]  naloxone (NARCAN) nasal spray 4 mg/0.1 mL Place 1 spray into the nose once. 11/29/22  Yes [provider]  Oxycodone HCl 20 MG TABS Take 1 tablet (20 mg total) by mouth every 8 (eight) hours as needed. Patient taking differently: Take 1 tablet by mouth 5 (five) times daily. 03/11/23  Yes Marcelino Duster, MD  rosuvastatin (CRESTOR) 20 MG tablet Take 20 mg by mouth at bedtime. 04/08/22  Yes [provider]  tiZANidine (ZANAFLEX) 4 MG capsule Take 4 mg by mouth at bedtime.   Yes [provider]  acetaminophen (TYLENOL) 325 MG tablet Take 2 tablets (650 mg total) by mouth every 6 (six) hours as needed for mild pain (or Fever >/= 101). Patient not taking: Reported on 03/04/2023 09/07/22   Hollice Espy, MD  erythromycin ophthalmic ointment Place 1 Application into both eyes at bedtime. Patient not taking: Reported on 03/04/2023 08/26/22   [provider]  lidocaine (LIDODERM) 5 % Place 1 patch onto the  skin daily. Patient not taking: Reported on 03/04/2023 01/24/23   [provider]    Allergies as of 04/24/2023 - Review Complete 03/04/2023  Allergen Reaction Noted   Gabapentin Other (See Comments) 07/05/2022   Amlodipine  03/02/2018    Family History  Problem Relation Age of Onset   Diabetes Mother    Cancer Mother     Social History   Socioeconomic History   Marital status: Single    Spouse name: Not on file   Number of children: Not on file   Years of education: Not on file   Highest education level: Not on file  Occupational History   Not on file  Tobacco Use   Smoking status: Every Day    Current packs/day: 2.00    Average packs/day: 2.0 packs/day for 48.0 years  (96.0 ttl pk-yrs)    Types: Cigarettes   Smokeless tobacco: Never   Tobacco comments:    has cut back to 1 PPD  Vaping Use   Vaping status: Never Used  Substance and Sexual Activity   Alcohol use: No   Drug use: No   Sexual activity: Yes    Partners: Female  Other Topics Concern   Not on file  Social History Narrative   Not on file   Social Determinants of Health   Financial Resource Strain: Low Risk  (09/25/2021)   Received from [facility], [facility]   Overall Financial Resource Strain (CARDIA)    Difficulty of Paying Living Expenses: Not hard at all  Food Insecurity: No Food Insecurity (03/04/2023)   Hunger Vital Sign    Worried About Running Out of Food in the Last Year: Never true    Ran Out of Food in the Last Year: Never true  Transportation Needs: No Transportation Needs (03/04/2023)   PRAPARE - Administrator, Civil Service (Medical): No    Lack of Transportation (Non-Medical): No  Physical Activity: Not on file  Stress: Not on file  Social Connections: Not on file  Intimate Partner Violence: Not At Risk (03/04/2023)   Humiliation, Afraid, Rape, and Kick questionnaire    Fear of Current or Ex-Partner: No    Emotionally Abused: No    Physically Abused: No    Sexually Abused: No    Review of Systems: See HPI, otherwise negative ROS  Physical Exam: BP 123/80   Pulse 89   Temp (!) 97.2 F (36.2 C) (Temporal)   Resp 13   Ht 5\' 10"  (1.778 m)   Wt 88.3 kg   SpO2 98%   BMI 27.94 kg/m  General:   Alert,  pleasant and cooperative in NAD Head:  Normocephalic and atraumatic. Lungs:  Clear to auscultation.    Heart:  Regular rate and rhythm.   Impression/Plan: Albert Figueroa is here for ophthalmic surgery.  Risks, benefits, limitations, and alternatives regarding ophthalmic surgery have been reviewed with the patient.  Questions have been answered.  All parties agreeable.   Lockie Mola, MD  05/14/2023, 7:39 AM

## 2023-05-14 NOTE — Op Note (Signed)
LOCATION:  [facility]   PREOPERATIVE DIAGNOSIS:    Nuclear sclerotic cataract right eye. H25.11   POSTOPERATIVE DIAGNOSIS:  Nuclear sclerotic cataract right eye.     PROCEDURE:  Phacoemusification with posterior chamber intraocular lens placement of the right eye   ULTRASOUND TIME: Procedure(s) with comments: CATARACT EXTRACTION PHACO AND INTRAOCULAR LENS PLACEMENT (IOC) RIGHT DIABETIC (Right) - 6.70 0:39.8  LENS:   Implant Name Type Inv. Item Serial No. Manufacturer Lot No. LRB No. Used Action  LENS IOL TECNIS EYHANCE 17.0 - [id number] Intraocular Lens LENS IOL TECNIS EYHANCE 17.0 [id number] [employer]  Right 1 Implanted         SURGEON:  Deirdre Evener, MD   ANESTHESIA:  Topical with tetracaine drops and 2% Xylocaine jelly, augmented with 1% preservative-free intracameral lidocaine.    COMPLICATIONS:  None.   DESCRIPTION OF PROCEDURE:  The patient was identified in the holding room and transported to the operating room and placed in the supine position under the operating microscope.  The right eye was identified as the operative eye and it was prepped and draped in the usual sterile ophthalmic fashion.   A 1 millimeter clear-corneal paracentesis was made at the 12:00 position.  0.5 ml of preservative-free 1% lidocaine was injected into the anterior chamber. The anterior chamber was filled with Viscoat viscoelastic.  A 2.4 millimeter keratome was used to make a near-clear corneal incision at the 9:00 position.  A curvilinear capsulorrhexis was made with a cystotome and capsulorrhexis forceps.  Balanced salt solution was used to hydrodissect and hydrodelineate the nucleus.   Phacoemulsification was then used in stop and chop fashion to remove the lens nucleus and epinucleus.  The remaining cortex was then removed using the irrigation and aspiration handpiece. Provisc was then placed into the capsular bag to distend it for lens placement.  A lens was then injected  into the capsular bag.  The remaining viscoelastic was aspirated.   Wounds were hydrated with balanced salt solution.  The anterior chamber was inflated to a physiologic pressure with balanced salt solution.  No wound leaks were noted. Cefuroxime 0.1 ml of a 10mg /ml solution was injected into the anterior chamber for a dose of 1 mg of intracameral antibiotic at the completion of the case.   Timolol and Brimonidine drops were applied to the eye.  The patient was taken to the recovery room in stable condition without complications of anesthesia or surgery.   , 05/14/2023, 8:20 AM

## 2023-05-14 NOTE — Anesthesia Postprocedure Evaluation (Signed)
Anesthesia Post Note  Patient: Albert Figueroa  Procedure(s) Performed: CATARACT EXTRACTION PHACO AND INTRAOCULAR LENS PLACEMENT (IOC) RIGHT DIABETIC (Right: Eye)  Patient location during evaluation: PACU Anesthesia Type: MAC Level of consciousness: awake and alert Pain management: pain level controlled Vital Signs Assessment: post-procedure vital signs reviewed and stable Respiratory status: spontaneous breathing, nonlabored ventilation, respiratory function stable and patient connected to nasal cannula oxygen Cardiovascular status: stable and blood pressure returned to baseline Postop Assessment: no apparent nausea or vomiting Anesthetic complications: no   No notable events documented.   Last Vitals:  Vitals:   05/14/23 0713 05/14/23 0820  BP: 123/80 124/74  Pulse: 89 72  Resp: 13 16  Temp: (!) 36.2 C (!) 36.4 C  SpO2: 98% 100%    Last Pain:  Vitals:   05/14/23 0820  TempSrc:   PainSc: 8                   C 

## 2023-05-14 NOTE — Transfer of Care (Signed)
Immediate Anesthesia Transfer of Care Note  Patient: Albert Figueroa  Procedure(s) Performed: CATARACT EXTRACTION PHACO AND INTRAOCULAR LENS PLACEMENT (IOC) RIGHT DIABETIC (Right: Eye)  Patient Location: PACU  Anesthesia Type: MAC  Level of Consciousness: awake, alert  and patient cooperative  Airway and Oxygen Therapy: Patient Spontanous Breathing and Patient connected to supplemental oxygen  Post-op Assessment: Post-op Vital signs reviewed, Patient's Cardiovascular Status Stable, Respiratory Function Stable, Patent Airway and No signs of Nausea or vomiting  Post-op Vital Signs: Reviewed and stable  Complications: No notable events documented.

## 2023-05-15 ENCOUNTER — Encounter: Payer: Self-pay | Admitting: Ophthalmology

## 2023-05-19 ENCOUNTER — Encounter: Payer: Self-pay | Admitting: Ophthalmology

## 2023-05-23 NOTE — Anesthesia Preprocedure Evaluation (Signed)
Anesthesia Evaluation  Patient identified by MRN, date of birth, ID band Patient awake    Reviewed: Allergy & Precautions, H&P , NPO status , Patient's Chart, lab work & pertinent test results  Airway Mallampati: IV  TM Distance: >3 FB Neck ROM: Full    Dental no notable dental hx. (+) Edentulous Upper, Edentulous Lower   Pulmonary COPD, Current Smoker and Patient abstained from smoking.   Pulmonary exam normal breath sounds clear to auscultation       Cardiovascular hypertension, Normal cardiovascular exam Rhythm:Regular Rate:Normal     Neuro/Psych Seizures -,  PSYCHIATRIC DISORDERS Anxiety Depression   Dementia negative neurological ROS  negative psych ROS   GI/Hepatic negative GI ROS, Neg liver ROS,GERD  ,,  Endo/Other  diabetes    Renal/GU Renal diseasenegative Renal ROS  negative genitourinary   Musculoskeletal negative musculoskeletal ROS (+) Arthritis ,    Abdominal   Peds negative pediatric ROS (+)  Hematology negative hematology ROS (+) Blood dyscrasia, anemia   Anesthesia Other Findings Hypertension  Diabetes mellitus without complication (HCC) Arthritis  Seizure (HCC) Osteoarthrosis  Hyperlipidemia Kidney disease  Lumbar stenosis Emphysema/COPD (HCC) Epilepsy (HCC) Meningitis spinal  History of brain shunt Lewy body dementia (HCC)  Tremor of both hands Stage 4 chronic kidney disease (HCC)  Previous cataract surgery 05-14-23 Dr. Juel Burrow Family member states patient "moves a lot" and patient agrees. As previously, I instructed patient to please try to speak up if he has to move; discussed w/surgeon   Reproductive/Obstetrics negative OB ROS                              Anesthesia Physical Anesthesia Plan  ASA: 4  Anesthesia Plan: MAC   Post-op Pain Management:    Induction: Intravenous  PONV Risk Score and Plan:   Airway Management Planned: Natural Airway and  Nasal Cannula  Additional Equipment:   Intra-op Plan:   Post-operative Plan:   Informed Consent: I have reviewed the patients History and Physical, chart, labs and discussed the procedure including the risks, benefits and alternatives for the proposed anesthesia with the patient or authorized representative who has indicated his/her understanding and acceptance.     Dental Advisory Given  Plan Discussed with: Anesthesiologist, CRNA and Surgeon  Anesthesia Plan Comments: (Patient consented for risks of anesthesia including but not limited to:  - adverse reactions to medications - damage to eyes, teeth, lips or other oral mucosa - nerve damage due to positioning  - sore throat or hoarseness - Damage to heart, brain, nerves, lungs, other parts of body or loss of life  Patient voiced understanding.)         Anesthesia Quick Evaluation

## 2023-05-26 NOTE — Discharge Instructions (Signed)

## 2023-05-28 ENCOUNTER — Ambulatory Visit: Payer: 59 | Admitting: Anesthesiology

## 2023-05-28 ENCOUNTER — Ambulatory Visit
Admission: RE | Admit: 2023-05-28 | Discharge: 2023-05-28 | Disposition: A | Discharge status: Home / Self Care | Payer: 59 | Attending: Ophthalmology | Admitting: Ophthalmology

## 2023-05-28 ENCOUNTER — Encounter
Admission: RE | Disposition: A | Discharge status: Home / Self Care | Payer: Self-pay | Source: Home / Self Care | Attending: Ophthalmology

## 2023-05-28 ENCOUNTER — Other Ambulatory Visit: Payer: Self-pay

## 2023-05-28 ENCOUNTER — Encounter: Payer: Self-pay | Admitting: Ophthalmology

## 2023-05-28 DIAGNOSIS — E1136 Type 2 diabetes mellitus with diabetic cataract: Secondary | ICD-10-CM | POA: Insufficient documentation

## 2023-05-28 DIAGNOSIS — F17210 Nicotine dependence, cigarettes, uncomplicated: Secondary | ICD-10-CM | POA: Diagnosis not present

## 2023-05-28 DIAGNOSIS — I129 Hypertensive chronic kidney disease with stage 1 through stage 4 chronic kidney disease, or unspecified chronic kidney disease: Secondary | ICD-10-CM | POA: Insufficient documentation

## 2023-05-28 DIAGNOSIS — N184 Chronic kidney disease, stage 4 (severe): Secondary | ICD-10-CM | POA: Diagnosis not present

## 2023-05-28 DIAGNOSIS — E1122 Type 2 diabetes mellitus with diabetic chronic kidney disease: Secondary | ICD-10-CM | POA: Insufficient documentation

## 2023-05-28 DIAGNOSIS — Z833 Family history of diabetes mellitus: Secondary | ICD-10-CM | POA: Diagnosis not present

## 2023-05-28 DIAGNOSIS — H2512 Age-related nuclear cataract, left eye: Secondary | ICD-10-CM | POA: Diagnosis not present

## 2023-05-28 HISTORY — PX: CATARACT EXTRACTION W/PHACO: SHX586

## 2023-05-28 LAB — GLUCOSE, CAPILLARY: Glucose-Capillary: 107 mg/dL — ABNORMAL HIGH (ref 70–99)

## 2023-05-28 SURGERY — PHACOEMULSIFICATION, CATARACT, WITH IOL INSERTION
Anesthesia: Monitor Anesthesia Care | Site: Eye | Laterality: Left

## 2023-05-28 MED ORDER — TETRACAINE HCL 0.5 % OP SOLN
1.0000 [drp] | OPHTHALMIC | Status: DC | PRN
  Administered 2023-05-28 (×3): 1 [drp] via OPHTHALMIC

## 2023-05-28 MED ORDER — CEFUROXIME OPHTHALMIC INJECTION 1 MG/0.1 ML
INJECTION | OPHTHALMIC | Status: DC | PRN
  Administered 2023-05-28: .1 mL via INTRACAMERAL

## 2023-05-28 MED ORDER — ARMC OPHTHALMIC DILATING DROPS
1.0000 | OPHTHALMIC | Status: DC | PRN
  Administered 2023-05-28 (×3): 1 via OPHTHALMIC

## 2023-05-28 MED ORDER — BRIMONIDINE TARTRATE-TIMOLOL 0.2-0.5 % OP SOLN
OPHTHALMIC | Status: DC | PRN
  Administered 2023-05-28: 1 [drp] via OPHTHALMIC

## 2023-05-28 MED ORDER — LACTATED RINGERS IV SOLN: INTRAVENOUS | Status: DC

## 2023-05-28 MED ORDER — MIDAZOLAM HCL 2 MG/2ML IJ SOLN
INTRAMUSCULAR | Status: DC | PRN
  Administered 2023-05-28: 2 mg via INTRAVENOUS

## 2023-05-28 MED ORDER — SIGHTPATH DOSE#1 BSS IO SOLN
INTRAOCULAR | Status: DC | PRN
  Administered 2023-05-28: 2 mL

## 2023-05-28 MED ORDER — SIGHTPATH DOSE#1 NA HYALUR & NA CHOND-NA HYALUR IO KIT
PACK | INTRAOCULAR | Status: DC | PRN
  Administered 2023-05-28: 1 via OPHTHALMIC

## 2023-05-28 MED ORDER — SIGHTPATH DOSE#1 BSS IO SOLN
INTRAOCULAR | Status: DC | PRN
  Administered 2023-05-28: 15 mL via INTRAOCULAR

## 2023-05-28 MED ORDER — SIGHTPATH DOSE#1 BSS IO SOLN
INTRAOCULAR | Status: DC | PRN
  Administered 2023-05-28: 61 mL via OPHTHALMIC

## 2023-05-28 SURGICAL SUPPLY — 11 items
CANNULA ANT/CHMB 27G (MISCELLANEOUS) IMPLANT
CANNULA ANT/CHMB 27GA (MISCELLANEOUS)
CATARACT SUITE SIGHTPATH (MISCELLANEOUS) ×1
FEE CATARACT SUITE SIGHTPATH (MISCELLANEOUS) ×1 IMPLANT
GLOVE SRG 8 PF TXTR STRL LF DI (GLOVE) ×1 IMPLANT
GLOVE SURG ENC TEXT LTX SZ7.5 (GLOVE) ×1 IMPLANT
GLOVE SURG UNDER POLY LF SZ8 (GLOVE) ×1
LENS IOL TECNIS EYHANCE 17.5 (Intraocular Lens) IMPLANT
NDL FILTER BLUNT 18X1 1/2 (NEEDLE) ×1 IMPLANT
NEEDLE FILTER BLUNT 18X1 1/2 (NEEDLE) ×1 IMPLANT
SYR 3ML LL SCALE MARK (SYRINGE) ×1 IMPLANT

## 2023-05-28 NOTE — Transfer of Care (Signed)
Immediate Anesthesia Transfer of Care Note  Patient: Albert Figueroa  Procedure(s) Performed: CATARACT EXTRACTION PHACO AND INTRAOCULAR LENS PLACEMENT (IOC) LEFT DIABETIC 6.34 00:37.1 (Left: Eye)  Patient Location: PACU  Anesthesia Type: MAC  Level of Consciousness: awake, alert  and patient cooperative  Airway and Oxygen Therapy: Patient Spontanous Breathing and Patient connected to supplemental oxygen  Post-op Assessment: Post-op Vital signs reviewed, Patient's Cardiovascular Status Stable, Respiratory Function Stable, Patent Airway and No signs of Nausea or vomiting  Post-op Vital Signs: Reviewed and stable  Complications: No notable events documented.

## 2023-05-28 NOTE — Anesthesia Postprocedure Evaluation (Signed)
Anesthesia Post Note  Patient: KAIGE EVINS  Procedure(s) Performed: CATARACT EXTRACTION PHACO AND INTRAOCULAR LENS PLACEMENT (IOC) LEFT DIABETIC 6.34 00:37.1 (Left: Eye)  Patient location during evaluation: PACU Anesthesia Type: MAC Level of consciousness: awake and alert Pain management: pain level controlled Vital Signs Assessment: post-procedure vital signs reviewed and stable Respiratory status: spontaneous breathing, nonlabored ventilation, respiratory function stable and patient connected to nasal cannula oxygen Cardiovascular status: stable and blood pressure returned to baseline Postop Assessment: no apparent nausea or vomiting Anesthetic complications: no   No notable events documented.   Last Vitals:  Vitals:   05/28/23 0823 05/28/23 0828  BP: (!) 147/85 139/76  Pulse: 65 (!) 57  Resp: 12 15  Temp: 36.4 C 36.4 C  SpO2: 100% 100%    Last Pain:  Vitals:   05/28/23 0828  TempSrc:   PainSc: 0-No pain                 Marisue Humble

## 2023-05-28 NOTE — H&P (Signed)
[facility]   Primary Care Physician:  Marisue Ivan, MD Ophthalmologist: Dr. Lockie Mola  Pre-Procedure History & Physical: HPI:  Albert Figueroa is a 67 y.o. male here for ophthalmic surgery.   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, spinal meningitis   Hyperlipidemia    Hypertension    Kidney disease    stage 3   Lewy body dementia (HCC)    short term memory issues only   Lumbar stenosis    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 in the 1980s"   Osteoarthrosis    Seizure (HCC)    none since 2009   Stage 4 chronic kidney disease (HCC)    Tremor of both hands     Past Surgical History:  Procedure Laterality Date   CATARACT EXTRACTION W/PHACO Right 05/14/2023   Procedure: CATARACT EXTRACTION PHACO AND INTRAOCULAR LENS PLACEMENT (IOC) RIGHT DIABETIC;  Surgeon: Lockie Mola, MD;  Location: [facility];  Service: Ophthalmology;  Laterality: Right;  6.70 0:39.8   CSF SHUNT     placed in 1980s   KNEE SURGERY     multiple surgeries on both knees    Prior to Admission medications   Medication Sig Start Date End Date Taking? Authorizing Provider  albuterol (VENTOLIN HFA) 108 (90 Base) MCG/ACT inhaler Inhale into the lungs every 6 (six) hours as needed for wheezing or shortness of breath.   Yes [provider]  dapagliflozin propanediol (FARXIGA) 10 MG TABS tablet Take 10 mg by mouth daily.   Yes [provider]  dexlansoprazole (DEXILANT) 60 MG capsule Take 60 mg by mouth daily.   Yes [provider]  divalproex (DEPAKOTE ER) 500 MG 24 hr tablet Take 1,000 mg by mouth in the morning and at bedtime.   Yes [provider]  donepezil (ARICEPT ODT) 10 MG disintegrating tablet Take 10 mg by mouth at bedtime.   Yes [provider]   fenofibrate 160 MG tablet Take 160 mg by mouth daily. 04/25/22  Yes [provider]  Fluticasone-Umeclidin-Vilant (TRELEGY ELLIPTA) 100-62.5-25 MCG/ACT AEPB Inhale into the lungs daily.   Yes [provider]  furosemide (LASIX) 20 MG tablet Take 10 mg by mouth as needed. 01/31/23  Yes [provider]  hydrOXYzine (ATARAX) 10 MG tablet Take 10 mg by mouth 3 (three) times daily as needed.   Yes [provider]  lisinopril (ZESTRIL) 20 MG tablet Take 20 mg by mouth daily.   Yes [provider]  Oxycodone HCl 20 MG TABS Take 1 tablet (20 mg total) by mouth every 8 (eight) hours as needed. Patient taking differently: Take 1 tablet by mouth 5 (five) times daily. 03/11/23  Yes Marcelino Duster, MD  rosuvastatin (CRESTOR) 20 MG tablet Take 20 mg by mouth at bedtime. 04/08/22  Yes [provider]  tiZANidine (ZANAFLEX) 4 MG capsule Take 4 mg by mouth at bedtime.   Yes [provider]  acetaminophen (TYLENOL) 325 MG tablet Take 2 tablets (650 mg total) by mouth every 6 (six) hours as needed for mild pain (or Fever >/= 101). Patient not taking: Reported on 03/04/2023 09/07/22   Hollice Espy, MD  erythromycin ophthalmic ointment Place 1 Application into both eyes at bedtime. Patient not taking: Reported  on 03/04/2023 08/26/22   [provider]  lidocaine (LIDODERM) 5 % Place 1 patch onto the skin daily. Patient not taking: Reported on 03/04/2023 01/24/23   [provider]  naloxone [facility]) nasal spray 4 mg/0.1 mL Place 1 spray into the nose once. 11/29/22   [provider]    Allergies as of 04/24/2023 - Review Complete 03/04/2023  Allergen Reaction Noted   Gabapentin Other (See Comments) 07/05/2022   Amlodipine  03/02/2018    Family History  Problem Relation Age of Onset   Diabetes Mother    Cancer Mother     Social History   Socioeconomic History   Marital status: Single    Spouse name: Not on file    Number of children: Not on file   Years of education: Not on file   Highest education level: Not on file  Occupational History   Not on file  Tobacco Use   Smoking status: Every Day    Current packs/day: 2.00    Average packs/day: 2.0 packs/day for 48.0 years (96.0 ttl pk-yrs)    Types: Cigarettes   Smokeless tobacco: Never   Tobacco comments:    has cut back to 1 PPD  Vaping Use   Vaping status: Never Used  Substance and Sexual Activity   Alcohol use: No   Drug use: No   Sexual activity: Yes    Partners: Female  Other Topics Concern   Not on file  Social History Narrative   Not on file   Social Determinants of Health   Financial Resource Strain: Low Risk  (09/25/2021)   Received from [facility]   Overall Financial Resource Strain (CARDIA)    Difficulty of Paying Living Expenses: Not hard at all  Food Insecurity: No Food Insecurity (03/04/2023)   Hunger Vital Sign    Worried About Running Out of Food in the Last Year: Never true    Ran Out of Food in the Last Year: Never true  Transportation Needs: No Transportation Needs (03/04/2023)   PRAPARE - Administrator, Civil Service (Medical): No    Lack of Transportation (Non-Medical): No  Physical Activity: Not on file  Stress: Not on file  Social Connections: Not on file  Intimate Partner Violence: Not At Risk (03/04/2023)   Humiliation, Afraid, Rape, and Kick questionnaire    Fear of Current or Ex-Partner: No    Emotionally Abused: No    Physically Abused: No    Sexually Abused: No    Review of Systems: See HPI, otherwise negative ROS  Physical Exam: BP (!) 162/87   Temp (!) 97 F (36.1 C) (Temporal)   Resp 12   Ht 5\' 10"  (1.778 m)   Wt 89.4 kg   SpO2 99%   BMI 28.28 kg/m  General:   Alert,  pleasant and cooperative in NAD Head:  Normocephalic and atraumatic. Lungs:  Clear to auscultation.    Heart:  Regular rate and rhythm.   Impression/Plan: Albert Figueroa is here for  ophthalmic surgery.  Risks, benefits, limitations, and alternatives regarding ophthalmic surgery have been reviewed with the patient.  Questions have been answered.  All parties agreeable.   Lockie Mola, MD  05/28/2023, 7:34 AM

## 2023-05-28 NOTE — Op Note (Signed)
OPERATIVE NOTE  MARQUIL MINE [id number] 05/28/2023   PREOPERATIVE DIAGNOSIS:  Nuclear sclerotic cataract left eye. H25.12   POSTOPERATIVE DIAGNOSIS:    Nuclear sclerotic cataract left eye.     PROCEDURE:  Phacoemusification with posterior chamber intraocular lens placement of the left eye  Ultrasound time: Procedure(s): CATARACT EXTRACTION PHACO AND INTRAOCULAR LENS PLACEMENT (IOC) LEFT DIABETIC 6.34 00:37.1 (Left)  LENS:   Implant Name Type Inv. Item Serial No. Manufacturer Lot No. LRB No. Used Action  LENS IOL TECNIS EYHANCE 17.5 - [id number] Intraocular Lens LENS IOL TECNIS EYHANCE 17.5 [id number] [employer]  Left 1 Implanted      SURGEON:  Deirdre Evener, MD   ANESTHESIA:  Topical with tetracaine drops and 2% Xylocaine jelly, augmented with 1% preservative-free intracameral lidocaine.    COMPLICATIONS:  None.   DESCRIPTION OF PROCEDURE:  The patient was identified in the holding room and transported to the operating room and placed in the supine position under the operating microscope.  The left eye was identified as the operative eye and it was prepped and draped in the usual sterile ophthalmic fashion.   A 1 millimeter clear-corneal paracentesis was made at the 1:30 position.  0.5 ml of preservative-free 1% lidocaine was injected into the anterior chamber.  The anterior chamber was filled with Viscoat viscoelastic.  A 2.4 millimeter keratome was used to make a near-clear corneal incision at the 10:30 position.  .  A curvilinear capsulorrhexis was made with a cystotome and capsulorrhexis forceps.  Balanced salt solution was used to hydrodissect and hydrodelineate the nucleus.   Phacoemulsification was then used in stop and chop fashion to remove the lens nucleus and epinucleus.  The remaining cortex was then removed using the irrigation and aspiration handpiece. Provisc was then placed into the capsular bag to distend it for lens placement.  A lens was then injected into  the capsular bag.  The remaining viscoelastic was aspirated.   Wounds were hydrated with balanced salt solution.  The anterior chamber was inflated to a physiologic pressure with balanced salt solution.  No wound leaks were noted. Cefuroxime 0.1 ml of a 10mg /ml solution was injected into the anterior chamber for a dose of 1 mg of intracameral antibiotic at the completion of the case.   Timolol and Brimonidine drops were applied to the eye.  The patient was taken to the recovery room in stable condition without complications of anesthesia or surgery.  Maksym Pfiffner 05/28/2023, 8:22 AM

## 2023-05-29 ENCOUNTER — Encounter: Payer: Self-pay | Admitting: Ophthalmology

## 2023-08-27 ENCOUNTER — Other Ambulatory Visit: Payer: Self-pay | Admitting: Anesthesiology

## 2023-08-27 DIAGNOSIS — M51369 Other intervertebral disc degeneration, lumbar region without mention of lumbar back pain or lower extremity pain: Secondary | ICD-10-CM

## 2023-08-27 DIAGNOSIS — M545 Low back pain, unspecified: Secondary | ICD-10-CM

## 2023-09-01 ENCOUNTER — Ambulatory Visit
Admission: RE | Admit: 2023-09-01 | Discharge: 2023-09-01 | Disposition: A | Discharge status: Home / Self Care | Payer: 59 | Source: Ambulatory Visit | Attending: Anesthesiology | Admitting: Anesthesiology

## 2023-09-01 DIAGNOSIS — M545 Low back pain, unspecified: Secondary | ICD-10-CM | POA: Insufficient documentation

## 2023-09-01 DIAGNOSIS — M51369 Other intervertebral disc degeneration, lumbar region without mention of lumbar back pain or lower extremity pain: Secondary | ICD-10-CM | POA: Insufficient documentation

## 2023-09-30 ENCOUNTER — Ambulatory Visit: Payer: 59 | Admitting: Oncology

## 2023-09-30 ENCOUNTER — Other Ambulatory Visit: Payer: 59

## 2023-10-06 ENCOUNTER — Encounter: Payer: Self-pay | Admitting: Oncology

## 2023-10-07 ENCOUNTER — Other Ambulatory Visit: Payer: 59

## 2023-10-07 ENCOUNTER — Ambulatory Visit: Payer: 59 | Admitting: Oncology

## 2023-10-13 ENCOUNTER — Inpatient Hospital Stay: Payer: 59 | Attending: Oncology

## 2023-10-13 ENCOUNTER — Inpatient Hospital Stay (HOSPITAL_BASED_OUTPATIENT_CLINIC_OR_DEPARTMENT_OTHER): Payer: 59 | Admitting: Oncology

## 2023-10-13 ENCOUNTER — Encounter: Payer: Self-pay | Admitting: Oncology

## 2023-10-13 VITALS — BP 121/87 | HR 80 | Temp 96.6°F | Resp 19 | Wt 196.0 lb

## 2023-10-13 DIAGNOSIS — N189 Chronic kidney disease, unspecified: Secondary | ICD-10-CM | POA: Diagnosis not present

## 2023-10-13 DIAGNOSIS — D696 Thrombocytopenia, unspecified: Secondary | ICD-10-CM

## 2023-10-13 DIAGNOSIS — G3183 Dementia with Lewy bodies: Secondary | ICD-10-CM | POA: Diagnosis not present

## 2023-10-13 DIAGNOSIS — F17210 Nicotine dependence, cigarettes, uncomplicated: Secondary | ICD-10-CM | POA: Diagnosis not present

## 2023-10-13 DIAGNOSIS — K921 Melena: Secondary | ICD-10-CM | POA: Diagnosis not present

## 2023-10-13 DIAGNOSIS — F0280 Dementia in other diseases classified elsewhere without behavioral disturbance: Secondary | ICD-10-CM | POA: Diagnosis not present

## 2023-10-13 DIAGNOSIS — D649 Anemia, unspecified: Secondary | ICD-10-CM | POA: Diagnosis not present

## 2023-10-13 LAB — CBC (CANCER CENTER ONLY)
HCT: 39.1 % (ref 39.0–52.0)
Hemoglobin: 12.7 g/dL — ABNORMAL LOW (ref 13.0–17.0)
MCH: 31.7 pg (ref 26.0–34.0)
MCHC: 32.5 g/dL (ref 30.0–36.0)
MCV: 97.5 fL (ref 80.0–100.0)
Platelet Count: 97 10*3/uL — ABNORMAL LOW (ref 150–400)
RBC: 4.01 MIL/uL — ABNORMAL LOW (ref 4.22–5.81)
RDW: 14.6 % (ref 11.5–15.5)
WBC Count: 5.7 10*3/uL (ref 4.0–10.5)
nRBC: 0 % (ref 0.0–0.2)

## 2023-10-13 LAB — IRON AND TIBC
Iron: 150 ug/dL (ref 45–182)
Saturation Ratios: 31 % (ref 17.9–39.5)
TIBC: 487 ug/dL — ABNORMAL HIGH (ref 250–450)
UIBC: 337 ug/dL

## 2023-10-13 LAB — FERRITIN: Ferritin: 133 ng/mL (ref 24–336)

## 2023-10-13 LAB — FOLATE: Folate: 11.7 ng/mL (ref >5.9)

## 2023-10-13 LAB — VITAMIN B12: Vitamin B-12: 494 pg/mL (ref 180–914)

## 2023-10-13 NOTE — Progress Notes (Signed)
 [facility]  [phone]) [phone] Fax:(336) [phone]  ID: Albert Figueroa OB: [birth year]  MR#: [id number]  RDW#:[id number]  Patient Care Team: Alla Amis, MD as PCP - General (Family Medicine)  CHIEF COMPLAINT: Thrombocytopenia.  INTERVAL HISTORY: Patient was last evaluated as an inpatient consult in December 2023.  He is referred back for persistent thrombocytopenia.  Patient feels well and is at his baseline.  He has chronic weakness and fatigue.  He has a poor memory secondary to underlying dementia.  He has no neurologic complaints.  He denies any recent fevers or illnesses.  He has a good appetite and denies weight loss.  He has no chest pain, shortness of breath, cough, or hemoptysis.  He denies any nausea, vomiting, constipation, or diarrhea.  He admits to recent melena, but denies any hematochezia.  He has no urinary complaints.  Patient offers no further specific complaints today.  REVIEW OF SYSTEMS:   Review of Systems  Constitutional:  Positive for malaise/fatigue. Negative for fever and weight loss.  Respiratory: Negative.  Negative for cough, hemoptysis and shortness of breath.   Cardiovascular: Negative.  Negative for chest pain and leg swelling.  Gastrointestinal:  Positive for melena. Negative for abdominal pain and blood in stool.  Genitourinary: Negative.  Negative for dysuria.  Musculoskeletal: Negative.  Negative for back pain.  Skin: Negative.  Negative for rash.  Neurological:  Positive for weakness. Negative for dizziness, focal weakness and headaches.  Psychiatric/Behavioral:  Positive for memory loss.     As per HPI. Otherwise, a complete review of systems is negative.  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   Osteoarthrosis    Seizure (HCC)    none since 2009   Stage 4 chronic kidney disease (HCC)    Tremor of both hands     PAST SURGICAL HISTORY: Past Surgical History:  Procedure Laterality Date   CATARACT EXTRACTION W/PHACO Right 05/14/2023   Procedure: CATARACT EXTRACTION PHACO AND INTRAOCULAR LENS PLACEMENT (IOC) RIGHT DIABETIC;  Surgeon: Mittie Gaskin, MD;  Location: [facility];  Service: Ophthalmology;  Laterality: Right;  6.70 0:39.8   CATARACT EXTRACTION W/PHACO Left 05/28/2023   Procedure: CATARACT EXTRACTION PHACO AND INTRAOCULAR LENS PLACEMENT (IOC) LEFT DIABETIC 6.34 00:37.1;  Surgeon: Mittie Gaskin, MD;  Location: [facility];  Service: Ophthalmology;  Laterality: Left;   CSF SHUNT     placed in 1980s   KNEE SURGERY     multiple surgeries on both knees    FAMILY HISTORY: Family History  Problem Relation Age of Onset   Diabetes Mother    Cancer Mother     ADVANCED DIRECTIVES (Y/N):  N  HEALTH MAINTENANCE: Social History   Tobacco Use   Smoking status: Every Day    Current packs/day: 2.00    Average packs/day: 2.0 packs/day for 48.0 years (96.0 ttl pk-yrs)    Types: Cigarettes   Smokeless tobacco: Never   Tobacco comments:  has cut back to 1 PPD  Vaping Use   Vaping status: Never Used  Substance Use Topics   Alcohol  use: No   Drug use: No     Colonoscopy:  PAP:  Bone density:  Lipid panel:  Allergies  Allergen Reactions   Gabapentin  Other (See Comments)    Lethargic, Confused   Amlodipine     Other reaction(s): Other (see comments) Caused elevated blood pressure and headaches   Ozempic (0.25 Or 0.5 Mg-Dose) [Semaglutide(0.25 Or 0.5mg -Dos)]     Lethargy.  Also dropped blood sugars too low    Current Outpatient  Medications  Medication Sig Dispense Refill   albuterol  (VENTOLIN  HFA) 108 (90 Base) MCG/ACT inhaler Inhale into the lungs every 6 (six) hours as needed for wheezing or shortness of breath.     Cholecalciferol (VITAMIN D -1000 MAX ST) 25 MCG (1000 UT) tablet Take by mouth.     dapagliflozin  propanediol (FARXIGA ) 10 MG TABS tablet Take 10 mg by mouth daily.     dexlansoprazole (DEXILANT) 60 MG capsule Take 60 mg by mouth daily.     divalproex  (DEPAKOTE  ER) 500 MG 24 hr tablet Take 1,000 mg by mouth in the morning and at bedtime.     donepezil  (ARICEPT  ODT) 10 MG disintegrating tablet Take 10 mg by mouth at bedtime.     fenofibrate  160 MG tablet Take 160 mg by mouth daily.     Fluticasone -Umeclidin-Vilant (TRELEGY ELLIPTA) 100-62.5-25 MCG/ACT AEPB Inhale into the lungs daily.     furosemide (LASIX) 20 MG tablet Take 10 mg by mouth as needed.     hydrOXYzine  (ATARAX ) 10 MG tablet Take 10 mg by mouth 3 (three) times daily as needed.     lisinopril  (ZESTRIL ) 20 MG tablet Take 20 mg by mouth daily.     naloxone  (NARCAN ) nasal spray 4 mg/0.1 mL Place 1 spray into the nose once.     Oxycodone  HCl 20 MG TABS Take 1 tablet (20 mg total) by mouth every 8 (eight) hours as needed. (Patient taking differently: Take 1 tablet by mouth 5 (five) times daily.) 15 tablet 0   rosuvastatin  (CRESTOR ) 20 MG tablet Take 20 mg by mouth at bedtime.     tiZANidine  (ZANAFLEX ) 4 MG capsule Take 4 mg by mouth at bedtime.     acetaminophen  (TYLENOL ) 325 MG tablet Take 2 tablets (650 mg total) by mouth every 6 (six) hours as needed for mild pain (or Fever >/= 101). (Patient not taking: Reported on 03/04/2023)     erythromycin ophthalmic ointment Place 1 Application into both eyes at bedtime. (Patient not taking: Reported on 03/04/2023)     lidocaine  (LIDODERM ) 5 % Place 1 patch onto the skin daily. (Patient not taking: Reported on 03/04/2023)     No current facility-administered medications for this visit.    OBJECTIVE: Vitals:    10/13/23 1459  BP: 121/87  Pulse: 80  Resp: 19  Temp: (!) 96.6 F (35.9 C)  SpO2: 99%     Body mass index is 28.12 kg/m.    ECOG FS:1 - Symptomatic but completely ambulatory  General: Well-developed, well-nourished, no acute distress. Eyes: Pink conjunctiva, anicteric sclera. HEENT: Normocephalic, moist mucous membranes. Lungs: No audible wheezing or coughing. Heart: Regular rate and rhythm. Abdomen: Soft, nontender, no obvious distention. Musculoskeletal: No edema, cyanosis, or clubbing. Neuro: Alert, answering all questions appropriately. Cranial nerves grossly intact. Skin: No rashes or petechiae noted. Psych: Normal affect. Lymphatics: No cervical, calvicular, axillary or inguinal LAD.   LAB  RESULTS:  Lab Results  Component Value Date   NA 138 03/11/2023   K 4.1 03/11/2023   CL 107 03/11/2023   CO2 25 03/11/2023   GLUCOSE 85 03/11/2023   BUN 36 (H) 03/11/2023   CREATININE 1.74 (H) 03/11/2023   CALCIUM  8.3 (L) 03/11/2023   PROT 6.0 (L) 03/10/2023   ALBUMIN 2.6 (L) 03/10/2023   AST 19 03/10/2023   ALT 10 03/10/2023   ALKPHOS 61 03/10/2023   BILITOT 0.6 03/10/2023   GFRNONAA 43 (L) 03/11/2023   GFRAA >60 10/06/2012    Lab Results  Component Value Date   WBC 5.7 10/13/2023   NEUTROABS 1.2 (L) 03/07/2023   HGB 12.7 (L) 10/13/2023   HCT 39.1 10/13/2023   MCV 97.5 10/13/2023   PLT 97 (L) 10/13/2023     STUDIES: No results found.  ASSESSMENT: Thrombocytopenia.  PLAN:    Thrombocytopenia: Upon review of patient's chart patient has had a decreased platelet count since September 01, 2022 ranging from 71-115.  Today's result is 97.  All of his other laboratory work is pending at time of dictation.  No intervention is needed.  Patient does not require bone marrow biopsy.  After lengthy discussion with the patient, he does not wish follow-up at this time and prefers that his primary care physician monitor his laboratory work.  No follow-up has been scheduled.   Please refer patient back if his platelet count declines and falls persistently below 70,000 or if there are any other concerns. Anemia: Significantly improved.  Patient's hemoglobin is 12.7 today.  Iron stores are pending at time of dictation.  No intervention needed at this time. Melena: Hemoglobin as above.  Patient reports he has declined evaluation with colonoscopy but plans to do Cologuard in the near future. Chronic renal insufficiency: Chronic and unchanged.  Continue monitoring and treatment per primary care.   Patient expressed understanding and was in agreement with this plan. He also understands that He can call clinic at any time with any questions, concerns, or complaints.     Albert JINNY Reusing, MD   10/13/2023 4:22 PM

## 2023-10-13 NOTE — Progress Notes (Signed)
 Patient Significant other would like MD to know patient was DX with Dementia and Parkinson.

## 2023-10-14 LAB — PLATELET ANTIBODY PROFILE
Glycoprotein IV Antibody: NEGATIVE
HLA Ab Ser Ql EIA: NEGATIVE
IA/IIA Antibody: NEGATIVE
IB/IX Antibody: NEGATIVE
IIB/IIIA Antibody: NEGATIVE

## 2023-10-16 LAB — PROTEIN ELECTROPHORESIS, SERUM
A/G Ratio: 0.8 (ref 0.7–1.7)
Albumin ELP: 3.3 g/dL (ref 2.9–4.4)
Alpha-1-Globulin: 0.3 g/dL (ref 0.0–0.4)
Alpha-2-Globulin: 0.5 g/dL (ref 0.4–1.0)
Beta Globulin: 1.3 g/dL (ref 0.7–1.3)
Gamma Globulin: 2 g/dL — ABNORMAL HIGH (ref 0.4–1.8)
Globulin, Total: 4.1 g/dL — ABNORMAL HIGH (ref 2.2–3.9)
Total Protein ELP: 7.4 g/dL (ref 6.0–8.5)

## 2023-10-21 LAB — INTELLIGEN MYELOID

## 2023-12-09 ENCOUNTER — Emergency Department

## 2023-12-09 ENCOUNTER — Other Ambulatory Visit: Payer: Self-pay

## 2023-12-09 ENCOUNTER — Inpatient Hospital Stay
Admission: EM | Admit: 2023-12-09 | Discharge: 2023-12-24 | DRG: 682 | Disposition: A | Discharge status: Skilled Nursing Facility | Attending: Internal Medicine | Admitting: Internal Medicine

## 2023-12-09 DIAGNOSIS — E7220 Disorder of urea cycle metabolism, unspecified: Secondary | ICD-10-CM | POA: Diagnosis present

## 2023-12-09 DIAGNOSIS — Z1152 Encounter for screening for COVID-19: Secondary | ICD-10-CM

## 2023-12-09 DIAGNOSIS — E875 Hyperkalemia: Secondary | ICD-10-CM | POA: Diagnosis present

## 2023-12-09 DIAGNOSIS — E861 Hypovolemia: Secondary | ICD-10-CM | POA: Diagnosis present

## 2023-12-09 DIAGNOSIS — N179 Acute kidney failure, unspecified: Principal | ICD-10-CM

## 2023-12-09 DIAGNOSIS — E785 Hyperlipidemia, unspecified: Secondary | ICD-10-CM | POA: Diagnosis present

## 2023-12-09 DIAGNOSIS — Z833 Family history of diabetes mellitus: Secondary | ICD-10-CM

## 2023-12-09 DIAGNOSIS — K7682 Hepatic encephalopathy: Secondary | ICD-10-CM | POA: Diagnosis present

## 2023-12-09 DIAGNOSIS — E870 Hyperosmolality and hypernatremia: Secondary | ICD-10-CM

## 2023-12-09 DIAGNOSIS — Z7984 Long term (current) use of oral hypoglycemic drugs: Secondary | ICD-10-CM

## 2023-12-09 DIAGNOSIS — D61818 Other pancytopenia: Secondary | ICD-10-CM | POA: Diagnosis not present

## 2023-12-09 DIAGNOSIS — E860 Dehydration: Secondary | ICD-10-CM | POA: Diagnosis present

## 2023-12-09 DIAGNOSIS — G3183 Dementia with Lewy bodies: Secondary | ICD-10-CM | POA: Diagnosis present

## 2023-12-09 DIAGNOSIS — F02818 Dementia in other diseases classified elsewhere, unspecified severity, with other behavioral disturbance: Secondary | ICD-10-CM | POA: Diagnosis present

## 2023-12-09 DIAGNOSIS — N184 Chronic kidney disease, stage 4 (severe): Secondary | ICD-10-CM | POA: Diagnosis present

## 2023-12-09 DIAGNOSIS — I10 Essential (primary) hypertension: Secondary | ICD-10-CM | POA: Diagnosis not present

## 2023-12-09 DIAGNOSIS — E1122 Type 2 diabetes mellitus with diabetic chronic kidney disease: Secondary | ICD-10-CM | POA: Insufficient documentation

## 2023-12-09 DIAGNOSIS — D539 Nutritional anemia, unspecified: Secondary | ICD-10-CM | POA: Diagnosis present

## 2023-12-09 DIAGNOSIS — Z79899 Other long term (current) drug therapy: Secondary | ICD-10-CM

## 2023-12-09 DIAGNOSIS — J439 Emphysema, unspecified: Secondary | ICD-10-CM | POA: Diagnosis present

## 2023-12-09 DIAGNOSIS — D631 Anemia in chronic kidney disease: Secondary | ICD-10-CM | POA: Diagnosis present

## 2023-12-09 DIAGNOSIS — F03918 Unspecified dementia, unspecified severity, with other behavioral disturbance: Secondary | ICD-10-CM | POA: Insufficient documentation

## 2023-12-09 DIAGNOSIS — R4182 Altered mental status, unspecified: Principal | ICD-10-CM

## 2023-12-09 DIAGNOSIS — G9341 Metabolic encephalopathy: Secondary | ICD-10-CM | POA: Diagnosis present

## 2023-12-09 DIAGNOSIS — I129 Hypertensive chronic kidney disease with stage 1 through stage 4 chronic kidney disease, or unspecified chronic kidney disease: Secondary | ICD-10-CM | POA: Diagnosis present

## 2023-12-09 DIAGNOSIS — F17210 Nicotine dependence, cigarettes, uncomplicated: Secondary | ICD-10-CM | POA: Diagnosis present

## 2023-12-09 DIAGNOSIS — Z8661 Personal history of infections of the central nervous system: Secondary | ICD-10-CM

## 2023-12-09 DIAGNOSIS — Z7951 Long term (current) use of inhaled steroids: Secondary | ICD-10-CM

## 2023-12-09 DIAGNOSIS — G40909 Epilepsy, unspecified, not intractable, without status epilepticus: Secondary | ICD-10-CM | POA: Diagnosis present

## 2023-12-09 DIAGNOSIS — Z720 Tobacco use: Secondary | ICD-10-CM

## 2023-12-09 DIAGNOSIS — G20A1 Parkinson's disease without dyskinesia, without mention of fluctuations: Secondary | ICD-10-CM | POA: Diagnosis present

## 2023-12-09 DIAGNOSIS — N189 Chronic kidney disease, unspecified: Secondary | ICD-10-CM | POA: Diagnosis not present

## 2023-12-09 LAB — BASIC METABOLIC PANEL
Anion gap: 10 (ref 5–15)
BUN: 68 mg/dL — ABNORMAL HIGH (ref 8–23)
CO2: 22 mmol/L (ref 22–32)
Calcium: 9 mg/dL (ref 8.9–10.3)
Chloride: 119 mmol/L — ABNORMAL HIGH (ref 98–111)
Creatinine, Ser: 4.73 mg/dL — ABNORMAL HIGH (ref 0.61–1.24)
GFR, Estimated: 13 mL/min — ABNORMAL LOW (ref >60)
Glucose, Bld: 84 mg/dL (ref 70–99)
Potassium: 4.3 mmol/L (ref 3.5–5.1)
Sodium: 151 mmol/L — ABNORMAL HIGH (ref 135–145)

## 2023-12-09 LAB — CBC WITH DIFFERENTIAL/PLATELET
Abs Immature Granulocytes: 0.01 10*3/uL (ref 0.00–0.07)
Basophils Absolute: 0 10*3/uL (ref 0.0–0.1)
Basophils Relative: 1 %
Eosinophils Absolute: 0.1 10*3/uL (ref 0.0–0.5)
Eosinophils Relative: 4 %
HCT: 30 % — ABNORMAL LOW (ref 39.0–52.0)
Hemoglobin: 9.3 g/dL — ABNORMAL LOW (ref 13.0–17.0)
Immature Granulocytes: 0 %
Lymphocytes Relative: 48 %
Lymphs Abs: 1.7 10*3/uL (ref 0.7–4.0)
MCH: 33.5 pg (ref 26.0–34.0)
MCHC: 31 g/dL (ref 30.0–36.0)
MCV: 107.9 fL — ABNORMAL HIGH (ref 80.0–100.0)
Monocytes Absolute: 0.3 10*3/uL (ref 0.1–1.0)
Monocytes Relative: 10 %
Neutro Abs: 1.3 10*3/uL — ABNORMAL LOW (ref 1.7–7.7)
Neutrophils Relative %: 37 %
Platelets: 114 10*3/uL — ABNORMAL LOW (ref 150–400)
RBC: 2.78 MIL/uL — ABNORMAL LOW (ref 4.22–5.81)
RDW: 15.1 % (ref 11.5–15.5)
WBC: 3.4 10*3/uL — ABNORMAL LOW (ref 4.0–10.5)
nRBC: 0 % (ref 0.0–0.2)

## 2023-12-09 LAB — HEPATIC FUNCTION PANEL
ALT: 12 U/L (ref 0–44)
AST: 29 U/L (ref 15–41)
Albumin: 3 g/dL — ABNORMAL LOW (ref 3.5–5.0)
Alkaline Phosphatase: 47 U/L (ref 38–126)
Bilirubin, Direct: 0.4 mg/dL — ABNORMAL HIGH (ref 0.0–0.2)
Indirect Bilirubin: 0.6 mg/dL (ref 0.3–0.9)
Total Bilirubin: 1 mg/dL (ref 0.0–1.2)
Total Protein: 7 g/dL (ref 6.5–8.1)

## 2023-12-09 LAB — URINALYSIS, ROUTINE W REFLEX MICROSCOPIC
Bilirubin Urine: NEGATIVE
Glucose, UA: 150 mg/dL — AB
Hgb urine dipstick: NEGATIVE
Ketones, ur: NEGATIVE mg/dL
Leukocytes,Ua: NEGATIVE
Nitrite: NEGATIVE
Protein, ur: NEGATIVE mg/dL
Specific Gravity, Urine: 1.015 (ref 1.005–1.030)
pH: 5 (ref 5.0–8.0)

## 2023-12-09 LAB — TROPONIN I (HIGH SENSITIVITY)
Troponin I (High Sensitivity): 9 ng/L (ref <18)
Troponin I (High Sensitivity): 8 ng/L (ref <18)

## 2023-12-09 LAB — CBG MONITORING, ED: Glucose-Capillary: 79 mg/dL (ref 70–99)

## 2023-12-09 LAB — AMMONIA: Ammonia: 62 umol/L — ABNORMAL HIGH (ref 9–35)

## 2023-12-09 LAB — ETHANOL: Alcohol, Ethyl (B): <10 mg/dL (ref <10)

## 2023-12-09 MED ORDER — DAPAGLIFLOZIN PROPANEDIOL 10 MG PO TABS
10.0000 mg | ORAL_TABLET | Freq: Every day | ORAL | Status: DC
  Administered 2023-12-10 – 2023-12-11 (×2): 10 mg via ORAL
  Filled 2023-12-09 (×2): qty 1

## 2023-12-09 MED ORDER — ALBUTEROL SULFATE (2.5 MG/3ML) 0.083% IN NEBU: 2.5000 mg | INHALATION_SOLUTION | Freq: Four times a day (QID) | RESPIRATORY_TRACT | Status: DC | PRN

## 2023-12-09 MED ORDER — UMECLIDINIUM BROMIDE 62.5 MCG/ACT IN AEPB
1.0000 | INHALATION_SPRAY | Freq: Every day | RESPIRATORY_TRACT | Status: DC
  Administered 2023-12-10 – 2023-12-24 (×15): 1 via RESPIRATORY_TRACT
  Filled 2023-12-09 (×4): qty 7

## 2023-12-09 MED ORDER — HYDROXYZINE HCL 10 MG PO TABS
10.0000 mg | ORAL_TABLET | Freq: Three times a day (TID) | ORAL | Status: DC | PRN
  Administered 2023-12-15 – 2023-12-23 (×6): 10 mg via ORAL
  Filled 2023-12-09 (×8): qty 1

## 2023-12-09 MED ORDER — ONDANSETRON HCL 4 MG/2ML IJ SOLN
4.0000 mg | Freq: Four times a day (QID) | INTRAMUSCULAR | Status: DC | PRN
Start: 2023-12-09 — End: 2023-12-25

## 2023-12-09 MED ORDER — FENOFIBRATE 160 MG PO TABS
160.0000 mg | ORAL_TABLET | Freq: Every day | ORAL | Status: DC
  Administered 2023-12-10 – 2023-12-24 (×15): 160 mg via ORAL
  Filled 2023-12-09 (×15): qty 1

## 2023-12-09 MED ORDER — HEPARIN SODIUM (PORCINE) 5000 UNIT/ML IJ SOLN
5000.0000 [IU] | Freq: Three times a day (TID) | INTRAMUSCULAR | Status: DC
  Administered 2023-12-10 – 2023-12-15 (×15): 5000 [IU] via SUBCUTANEOUS
  Filled 2023-12-09 (×15): qty 1

## 2023-12-09 MED ORDER — TRAZODONE HCL 50 MG PO TABS
25.0000 mg | ORAL_TABLET | Freq: Every evening | ORAL | Status: DC | PRN
  Administered 2023-12-15 – 2023-12-20 (×3): 25 mg via ORAL
  Filled 2023-12-09 (×3): qty 1

## 2023-12-09 MED ORDER — NALOXONE HCL 4 MG/0.1ML NA LIQD: 1.0000 | Freq: Once | NASAL | Status: DC | PRN

## 2023-12-09 MED ORDER — LACTULOSE 10 GM/15ML PO SOLN: 20.0000 g | Freq: Once | ORAL | Status: DC

## 2023-12-09 MED ORDER — ACETAMINOPHEN 325 MG PO TABS
650.0000 mg | ORAL_TABLET | Freq: Four times a day (QID) | ORAL | Status: DC | PRN
  Administered 2023-12-12 – 2023-12-16 (×3): 650 mg via ORAL
  Filled 2023-12-09 (×3): qty 2

## 2023-12-09 MED ORDER — ACETAMINOPHEN 650 MG RE SUPP: 650.0000 mg | Freq: Four times a day (QID) | RECTAL | Status: DC | PRN

## 2023-12-09 MED ORDER — OXYCODONE HCL 5 MG PO TABS
20.0000 mg | ORAL_TABLET | Freq: Every day | ORAL | Status: DC
  Filled 2023-12-09: qty 4

## 2023-12-09 MED ORDER — SODIUM CHLORIDE 0.9 % IV SOLN: INTRAVENOUS | Status: DC

## 2023-12-09 MED ORDER — DIVALPROEX SODIUM ER 250 MG PO TB24
1000.0000 mg | ORAL_TABLET | Freq: Two times a day (BID) | ORAL | Status: DC
  Administered 2023-12-10 – 2023-12-11 (×4): 1000 mg via ORAL
  Filled 2023-12-09 (×4): qty 4

## 2023-12-09 MED ORDER — DONEPEZIL HCL 5 MG PO TABS
10.0000 mg | ORAL_TABLET | Freq: Every day | ORAL | Status: DC
  Administered 2023-12-10 – 2023-12-24 (×16): 10 mg via ORAL
  Filled 2023-12-09 (×16): qty 2

## 2023-12-09 MED ORDER — MAGNESIUM HYDROXIDE 400 MG/5ML PO SUSP: 30.0000 mL | Freq: Every day | ORAL | Status: DC | PRN

## 2023-12-09 MED ORDER — ONDANSETRON HCL 4 MG PO TABS
4.0000 mg | ORAL_TABLET | Freq: Four times a day (QID) | ORAL | Status: DC | PRN
Start: 2023-12-09 — End: 2023-12-25

## 2023-12-09 MED ORDER — PANTOPRAZOLE SODIUM 40 MG PO TBEC
40.0000 mg | DELAYED_RELEASE_TABLET | Freq: Every day | ORAL | Status: DC
  Administered 2023-12-10 – 2023-12-24 (×15): 40 mg via ORAL
  Filled 2023-12-09 (×16): qty 1

## 2023-12-09 MED ORDER — LACTULOSE 10 GM/15ML PO SOLN
30.0000 g | Freq: Three times a day (TID) | ORAL | Status: DC
  Administered 2023-12-10 – 2023-12-22 (×35): 30 g via ORAL
  Filled 2023-12-09 (×42): qty 60

## 2023-12-09 MED ORDER — FLUTICASONE FUROATE-VILANTEROL 100-25 MCG/ACT IN AEPB
1.0000 | INHALATION_SPRAY | Freq: Every day | RESPIRATORY_TRACT | Status: DC
  Administered 2023-12-10 – 2023-12-24 (×15): 1 via RESPIRATORY_TRACT
  Filled 2023-12-09 (×3): qty 28

## 2023-12-09 MED ORDER — TIZANIDINE HCL 2 MG PO TABS
4.0000 mg | ORAL_TABLET | Freq: Every day | ORAL | Status: DC
  Filled 2023-12-09: qty 2

## 2023-12-09 MED ORDER — SODIUM CHLORIDE 0.9 % IV BOLUS
1000.0000 mL | Freq: Once | INTRAVENOUS | Status: AC
  Administered 2023-12-09: 1000 mL via INTRAVENOUS

## 2023-12-09 MED ORDER — ROSUVASTATIN CALCIUM 20 MG PO TABS
20.0000 mg | ORAL_TABLET | Freq: Every day | ORAL | Status: DC
  Administered 2023-12-10 – 2023-12-24 (×15): 20 mg via ORAL
  Filled 2023-12-09 (×16): qty 1

## 2023-12-09 NOTE — ED Provider Notes (Signed)
 [facility] Provider Note    Event Date/Time   First MD Initiated Contact with Patient 12/09/23 2027     (approximate)  History   Chief Complaint: Altered Mental Status  HPI  Albert Figueroa is a 68 y.o. male with a past medical history of diabetes, hypertension, hyperlipidemia, brain shunt, Lewy body dementia, Parkinson's, CKD, presents to the emergency department for altered mental status.  According to the son since yesterday they have noted the patient to be confused and he had a fall yesterday however the son states that confusion started before the fall.  Son states this does happen previously due to significant renal dysfunction.  Patient is awake alert he is answering questions mostly appropriately although does seem somewhat confused with slow responses at times.  Dry appearing mucous membranes.  Physical Exam   Triage Vital Signs: ED Triage Vitals  Encounter Vitals Group     BP 12/09/23 1919 (!) 95/56     Systolic BP Percentile --      Diastolic BP Percentile --      Pulse Rate 12/09/23 1919 60     Resp 12/09/23 1919 16     Temp 12/09/23 1919 97.6 F (36.4 C)     Temp Source 12/09/23 1919 Oral     SpO2 12/09/23 1919 99 %     Weight 12/09/23 1918 200 lb (90.7 kg)     Height 12/09/23 1918 6' (1.829 m)     Head Circumference --      Peak Flow --      Pain Score 12/09/23 1918 0     Pain Loc --      Pain Education --      Exclude from Growth Chart --     Most recent vital signs: Vitals:   12/09/23 1919  BP: (!) 95/56  Pulse: 60  Resp: 16  Temp: 97.6 F (36.4 C)  SpO2: 99%    General: Awake, answering most questions appropriately but slow responses and does not seem somewhat confused at times.  Dry mucous membranes. CV:  Good peripheral perfusion.  Regular rate and rhythm  Resp:  Normal effort.  Equal breath sounds bilaterally.  Abd:  No distention.  Soft, nontender.  No rebound or guarding.  ED Results / Procedures / Treatments    EKG  EKG viewed and interpreted by myself shows sinus bradycardia 57 bpm with a narrow QRS, normal axis, normal intervals, nonspecific ST changes.  RADIOLOGY  I have reviewed interpret the CT head images.  Shunt is seen however I do not see any significant abnormality or bleed. Radiology is read the CT scan as no acute abnormality.  Right frontal encephalomalacia.  MEDICATIONS ORDERED IN ED: Medications  sodium chloride 0.9 % bolus 1,000 mL (has no administration in time range)     IMPRESSION / MDM / ASSESSMENT AND PLAN / ED COURSE  I reviewed the triage vital signs and the nursing notes.  Patient's presentation is most consistent with acute presentation with potential threat to life or bodily function.  Patient presents to the emergency department for altered mental status weakness and a fall.  Son states symptoms have been ongoing since yesterday.  Patient appears dehydrated with dry mucous membranes on exam.  We will IV hydrate given the patient's borderline low blood pressure as well.  We will check labs including a CBC chemistry cardiac enzyme.  Will obtain a urine sample.  Given the report of today we will obtain a CT  scan of the head.  Will continue to closely monitor while awaiting further results.  Differential would include infectious etiology, metabolic or electrolyte abnormalities such as renal dysfunction or uremia.  Will also check an ammonia level as a precaution.  Patient's lab work has resulted showing acute renal failure.  Baseline creatinine is around 1.7 currently 4.7 tonight with a BUN of 68 possibly indicating uremia which could be a cause of his confusion.  Patient CBC shows no significant findings, chronic anemia.  Patient troponin reassuringly normal x 2.  Alcohol negative.  Patient's ammonia level is elevated at 62.  No history of hepatic encephalopathy previously.  I have added on liver function test as a precaution.  We will dose 20 g of lactulose while awaiting  hepatic function panel.  Given the patient's acute renal failure which has presented similarly in the past we will admit to the hospitalist for further treatment.  Patient and son agreeable to plan.  FINAL CLINICAL IMPRESSION(S) / ED DIAGNOSES   Fall  Altered mental status Confusion Acute renal failure  Note:  This document was prepared using Dragon voice recognition software and may include unintentional dictation errors.   Minna Antis, MD 12/09/23 2240

## 2023-12-09 NOTE — ED Triage Notes (Addendum)
 Pt to ED with son, per son pt has had some AMS and weakness since yesterday. Pt oriented to self and location. Pt has dried blood on hands, son is not sure where blood is coming from, no obvious lacerations or injuries noted. Pt slow to answer questions in triage. Pt denies pain. Son is not sure if pt takes blood thinners, none noted on pts chart

## 2023-12-09 NOTE — H&P (Signed)
 [place]   PATIENT NAME: Albert Figueroa    MR#:  [id number]  DATE OF BIRTH:  Aug 14, 1956  DATE OF ADMISSION:  12/09/2023  PRIMARY CARE PHYSICIAN: Marisue Ivan, MD   Patient is coming from: Home  REQUESTING/REFERRING PHYSICIAN: Minna Antis, MD  CHIEF COMPLAINT:   Chief Complaint  Patient presents with   Altered Mental Status    HISTORY OF PRESENT ILLNESS:  Albert Figueroa is a 68 y.o. Caucasian male with medical history significant for type 2 diabetes mellitus, COPD, hypertension, dyslipidemia, stage III chronic kidney disease, Lewy body dementia seizure disorder and stage IV chronic kidney disease who presented to the emergency room with acute onset of altered mental status with confusion since yesterday as well as generalized weakness over the last couple weeks.  He had a fall yesterday.  He denied any nausea or vomiting or diarrhea.  No chest pain or palpitations.  No fever or chills.  No cough or wheezing or dyspnea.  No bleeding diathesis.  No dysuria, oliguria or hematuria or flank pain.  ED Course: When he came to the ER, BP was 95/56 with otherwise normal vital signs.  BP later on was 111/64.  Labs revealed hyper natremia of 151 and hyperkalemia 119 with a BUN of 68 and creatinine 4.73 above previous levels, albumin 3 and direct bili 0.4.  Ammonia level came back elevated at 62.  High sensitive troponin I was 9 and later 8.  CBC showed anemia with hemoglobin 9.3 hematocrit 30 lower than previous levels of 12.7 and 39.1 on 10/13/2023.  Platelets were 114 compared to 97 then.  UA showed no 150 glucose.  Alcohol level was less than 10. EKG as reviewed by me : EKG showed sinus bradycardia with rate of 57 and T wave version inferiorly. Imaging: Noncontrast head CT scan revealed no acute intracranial abnormality.  Showed right frontal encephalomalacia, right posterior approach shunt catheter with tip in the anterior right lateral ventricle with no hydrocephalus.  The  patient was given 1 L bolus of IV lactated ringer and was ordered 20 g of p.o. lactulose.  He will be admitted to medical telemetry bed for further evaluation and management PAST MEDICAL HISTORY:   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, spinal meningitis   Hyperlipidemia    Hypertension    Kidney disease    stage 3   Lewy body dementia (HCC)    short term memory issues only   Lumbar stenosis    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 in the 1980s"   Osteoarthrosis    Seizure (HCC)    none since 2009   Stage 4 chronic kidney disease (HCC)    Tremor of both hands     PAST SURGICAL HISTORY:   Past Surgical History:  Procedure Laterality Date   CATARACT EXTRACTION W/PHACO Right 05/14/2023   Procedure: CATARACT EXTRACTION PHACO AND INTRAOCULAR LENS PLACEMENT (IOC) RIGHT DIABETIC;  Surgeon: Lockie Mola, MD;  Location: [facility];  Service: Ophthalmology;  Laterality: Right;  6.70 0:39.8   CATARACT EXTRACTION W/PHACO Left 05/28/2023   Procedure: CATARACT EXTRACTION PHACO AND INTRAOCULAR LENS PLACEMENT (IOC) LEFT DIABETIC 6.34 00:37.1;  Surgeon: Lockie Mola, MD;  Location: [facility];  Service: Ophthalmology;  Laterality: Left;   CSF SHUNT     placed in 1980s   KNEE SURGERY     multiple surgeries on both knees    SOCIAL HISTORY:   Social History   Tobacco Use   Smoking status: Every Day    Current packs/day: 2.00    Average packs/day: 2.0 packs/day for 48.0 years (96.0 ttl pk-yrs)    Types: Cigarettes   Smokeless tobacco: Never   Tobacco comments:    has cut back to 1 PPD  Substance Use Topics   Alcohol use: No    FAMILY HISTORY:   Family History  Problem Relation Age of Onset   Diabetes Mother    Cancer Mother     DRUG ALLERGIES:   Allergies  Allergen  Reactions   Gabapentin Other (See Comments)    Lethargic, Confused   Amlodipine     Other reaction(s): Other (see comments) Caused elevated blood pressure and headaches   Ozempic (0.25 Or 0.5 Mg-Dose) [Semaglutide(0.25 Or 0.5mg -Dos)]     Lethargy.  Also dropped blood sugars too low    REVIEW OF SYSTEMS:   ROS As per history of present illness. All pertinent systems were reviewed above. Constitutional, HEENT, cardiovascular, respiratory, GI, GU, musculoskeletal, neuro, psychiatric, endocrine, integumentary and hematologic systems were reviewed and are otherwise negative/unremarkable except for positive findings mentioned above in the HPI.   MEDICATIONS AT HOME:   Prior to Admission medications   Medication Sig Start Date End Date Taking? Authorizing Provider  acetaminophen (TYLENOL) 325 MG tablet Take 2 tablets (650 mg total) by mouth every 6 (six) hours as needed for mild pain (or Fever >/= 101). Patient not taking: Reported on 03/04/2023 09/07/22   Hollice Espy, MD  albuterol (VENTOLIN HFA) 108 (90 Base) MCG/ACT inhaler Inhale into the lungs every 6 (six) hours as needed for wheezing or shortness of breath.    [provider]  Cholecalciferol (VITAMIN D-1000 MAX ST) 25 MCG (1000 UT) tablet Take by mouth. 09/26/23   [provider]  dapagliflozin propanediol (FARXIGA) 10 MG TABS tablet Take 10 mg by mouth daily.    [provider]  dexlansoprazole (DEXILANT) 60 MG capsule Take 60 mg by mouth daily.    [provider]  divalproex (DEPAKOTE ER) 500 MG 24 hr tablet Take 1,000 mg by mouth in the morning and at bedtime.    [provider]  donepezil (ARICEPT ODT) 10 MG disintegrating tablet Take 10 mg by mouth at bedtime.    [provider]  erythromycin ophthalmic ointment Place 1 Application into both eyes at bedtime. Patient not taking: Reported on 03/04/2023 08/26/22   [provider]  fenofibrate 160 MG tablet Take 160 mg  by mouth daily. 04/25/22   [provider]  Fluticasone-Umeclidin-Vilant (TRELEGY ELLIPTA) 100-62.5-25 MCG/ACT AEPB Inhale into the lungs daily.    [provider]  furosemide (LASIX) 20 MG tablet Take 10 mg by mouth as needed. 01/31/23   [provider]  hydrOXYzine (ATARAX) 10 MG tablet Take 10 mg by mouth 3 (three) times daily as needed.    [provider]  lidocaine (LIDODERM) 5 % Place 1 patch onto the skin daily. Patient not taking: Reported on 03/04/2023 01/24/23   [provider]  lisinopril (ZESTRIL) 20 MG tablet Take 20 mg by mouth daily.    [provider]  naloxone [facility]) nasal spray 4 mg/0.1 mL Place 1 spray into the nose once. 11/29/22   [provider]  Oxycodone HCl  20 MG TABS Take 1 tablet (20 mg total) by mouth every 8 (eight) hours as needed. Patient taking differently: Take 1 tablet by mouth 5 (five) times daily. 03/11/23   Marcelino Duster, MD  rosuvastatin (CRESTOR) 20 MG tablet Take 20 mg by mouth at bedtime. 04/08/22   [provider]  tiZANidine (ZANAFLEX) 4 MG capsule Take 4 mg by mouth at bedtime.    [provider]      VITAL SIGNS:  Blood pressure 113/60, pulse (!) 56, temperature 97.6 F (36.4 C), temperature source Oral, resp. rate 12, height 6' (1.829 m), weight 90.7 kg, SpO2 100%.  PHYSICAL EXAMINATION:  Physical Exam  GENERAL:  68 y.o.-year-old Caucasian male patient lying in the bed with no acute distress.  He was very somnolent but arousable. EYES: Pupils equal, round, reactive to light and accommodation. No scleral icterus. Extraocular muscles intact.  HEENT: Head atraumatic, normocephalic. Oropharynx and nasopharynx clear.  NECK:  Supple, no jugular venous distention. No thyroid enlargement, no tenderness.  LUNGS: Normal breath sounds bilaterally, no wheezing, rales,rhonchi or crepitation. No use of accessory muscles of respiration.  CARDIOVASCULAR: Regular rate and rhythm,  S1, S2 normal. No murmurs, rubs, or gallops.  ABDOMEN: Soft, nondistended, nontender. Bowel sounds present. No organomegaly or mass.  EXTREMITIES: No pedal edema, cyanosis, or clubbing.  NEUROLOGIC: Cranial nerves II through XII are intact. Muscle strength 5/5 in all extremities. Sensation intact. Gait not checked.  PSYCHIATRIC: The patient is very somnolent but arousable.  No good eye contact. SKIN: No obvious rash, lesion, or ulcer.   LABORATORY PANEL:   CBC Recent Labs  Lab 12/09/23 1955  WBC 3.4*  HGB 9.3*  HCT 30.0*  PLT 114*   ------------------------------------------------------------------------------------------------------------------  Chemistries  Recent Labs  Lab 12/09/23 1955  NA 151*  K 4.3  CL 119*  CO2 22  GLUCOSE 84  BUN 68*  CREATININE 4.73*  CALCIUM 9.0  AST 29  ALT 12  ALKPHOS 47  BILITOT 1.0   ------------------------------------------------------------------------------------------------------------------  Cardiac Enzymes No results for input(s): "TROPONINI" in the last 168 hours. ------------------------------------------------------------------------------------------------------------------  RADIOLOGY:  CT HEAD WO CONTRAST ( ) Result Date: 12/09/2023 CLINICAL DATA:  Altered mental status EXAM: CT HEAD WITHOUT CONTRAST TECHNIQUE: Contiguous axial images were obtained from the base of the skull through the vertex without intravenous contrast. RADIATION DOSE REDUCTION: This exam was performed according to the departmental dose-optimization program which includes automated exposure control, adjustment of the mA and/or kV according to patient size and/or use of iterative reconstruction technique. COMPARISON:  03/04/2023 FINDINGS: Brain: Right frontal encephalomalacia. Right posterior approach shunt catheter with tip in the anterior right lateral ventricle. No hydrocephalus. No acute hemorrhage. Vascular: No hyperdense vessel or unexpected vascular  calcification. Skull: The visualized skull base, calvarium and extracranial soft tissues are normal. Sinuses/Orbits: No fluid levels or advanced mucosal thickening of the visualized paranasal sinuses. No mastoid or middle ear effusion. Normal orbits. Other: None. IMPRESSION: 1. No acute intracranial abnormality. 2. Right frontal encephalomalacia. 3. Right posterior approach shunt catheter with tip in the anterior right lateral ventricle. No hydrocephalus. Electronically Signed   By: Deatra Robinson M.D.   On: 12/09/2023 20:40      IMPRESSION AND PLAN:  Assessment and Plan: * Acute kidney injury superimposed on chronic kidney disease (HCC) - The patient be admitted to a medical telemetry bed. - We will continue hydration with IV 1/2 normal saline. - We will avoid nephrotoxins. - Will follow BMPs.  Acute hepatic encephalopathy (HCC) - Will be  placed on p.o. lactulose. - Will follow ammonia level.  Acute metabolic encephalopathy - This is likely secondary to #1 and #2. - Management as above.  Hypernatremia - This is clearly secondary to hypovolemia. - The patient will be hydrated with IV half-normal saline.  Will follow BMPs.  Dyslipidemia - We will continue statin therapy and fenofibrate.  Essential hypertension - We will continue antihypertensive therapy.  Dementia with behavioral disturbance (HCC) - We will continue Aricept and Depakote ER  Type 2 diabetes mellitus with chronic kidney disease, without long-term current use of insulin (HCC) - The patient will be placed on supplemental coverage with NovoLog. - We will continue [place].     DVT prophylaxis: Lovenox.  Advanced Care Planning:  Code Status: full code.  Family Communication:  The plan of care was discussed in details with the patient (and family). I answered all questions. The patient agreed to proceed with the above mentioned plan. Further management will depend upon hospital course. Disposition Plan: Back to  previous home environment Consults called: none.  All the records are reviewed and case discussed with ED provider.  Status is: Inpatient   At the time of the admission, it appears that the appropriate admission status for this patient is inpatient.  This is judged to be reasonable and necessary in order to provide the required intensity of service to ensure the patient's safety given the presenting symptoms, physical exam findings and initial radiographic and laboratory data in the context of comorbid conditions.  The patient requires inpatient status due to high intensity of service, high risk of further deterioration and high frequency of surveillance required.  I certify that at the time of admission, it is my clinical judgment that the patient will require inpatient hospital care extending more than 2 midnights.                            Dispo: The patient is from: Home              Anticipated d/c is to: Home              Patient currently is not medically stable to d/c.              Difficult to place patient: No  Hannah Beat M.D on 12/10/2023 at 2:40 AM  [facility]   From 7 PM-7 AM, contact night-coverage [email or web address]  CC: Primary care physician; Marisue Ivan, MD

## 2023-12-09 NOTE — ED Notes (Signed)
 Pt brought back to rm via W/C. Rn in room on arrival. This EDT and RN assisted pt to bed. Pt hooked up to cardiac monitor. Pt family at bedside. Bed locked and in lowest position. Side rails up and call light within reach. No further needs at this time.

## 2023-12-09 NOTE — ED Notes (Signed)
Urine sent to lab at this time.

## 2023-12-10 DIAGNOSIS — F03918 Unspecified dementia, unspecified severity, with other behavioral disturbance: Secondary | ICD-10-CM | POA: Insufficient documentation

## 2023-12-10 DIAGNOSIS — K7682 Hepatic encephalopathy: Secondary | ICD-10-CM

## 2023-12-10 DIAGNOSIS — N179 Acute kidney failure, unspecified: Secondary | ICD-10-CM | POA: Diagnosis not present

## 2023-12-10 DIAGNOSIS — N189 Chronic kidney disease, unspecified: Secondary | ICD-10-CM

## 2023-12-10 DIAGNOSIS — E1122 Type 2 diabetes mellitus with diabetic chronic kidney disease: Secondary | ICD-10-CM | POA: Insufficient documentation

## 2023-12-10 DIAGNOSIS — E870 Hyperosmolality and hypernatremia: Secondary | ICD-10-CM

## 2023-12-10 LAB — BASIC METABOLIC PANEL
Anion gap: 5 (ref 5–15)
BUN: 60 mg/dL — ABNORMAL HIGH (ref 8–23)
CO2: 23 mmol/L (ref 22–32)
Calcium: 8.4 mg/dL — ABNORMAL LOW (ref 8.9–10.3)
Chloride: 124 mmol/L — ABNORMAL HIGH (ref 98–111)
Creatinine, Ser: 4.12 mg/dL — ABNORMAL HIGH (ref 0.61–1.24)
GFR, Estimated: 15 mL/min — ABNORMAL LOW (ref >60)
Glucose, Bld: 79 mg/dL (ref 70–99)
Potassium: 3.7 mmol/L (ref 3.5–5.1)
Sodium: 152 mmol/L — ABNORMAL HIGH (ref 135–145)

## 2023-12-10 LAB — CBC
HCT: 27.1 % — ABNORMAL LOW (ref 39.0–52.0)
Hemoglobin: 8.5 g/dL — ABNORMAL LOW (ref 13.0–17.0)
MCH: 34.3 pg — ABNORMAL HIGH (ref 26.0–34.0)
MCHC: 31.4 g/dL (ref 30.0–36.0)
MCV: 109.3 fL — ABNORMAL HIGH (ref 80.0–100.0)
Platelets: 80 10*3/uL — ABNORMAL LOW (ref 150–400)
RBC: 2.48 MIL/uL — ABNORMAL LOW (ref 4.22–5.81)
RDW: 14.8 % (ref 11.5–15.5)
WBC: 2.7 10*3/uL — ABNORMAL LOW (ref 4.0–10.5)
nRBC: 0 % (ref 0.0–0.2)

## 2023-12-10 LAB — HIV ANTIBODY (ROUTINE TESTING W REFLEX): HIV Screen 4th Generation wRfx: NONREACTIVE

## 2023-12-10 LAB — AMMONIA: Ammonia: 126 umol/L — ABNORMAL HIGH (ref 9–35)

## 2023-12-10 MED ORDER — SODIUM CHLORIDE 0.45 % IV SOLN: INTRAVENOUS | Status: DC

## 2023-12-10 MED ORDER — DIVALPROEX SODIUM ER 250 MG PO TB24
500.0000 mg | ORAL_TABLET | Freq: Every day | ORAL | Status: DC
Start: 2023-12-10 — End: 2023-12-12
  Administered 2023-12-10 – 2023-12-11 (×2): 500 mg via ORAL
  Filled 2023-12-10 (×2): qty 2

## 2023-12-10 NOTE — Assessment & Plan Note (Addendum)
-   The patient be admitted to a medical telemetry bed. - We will continue hydration with IV 1/2 normal saline. - We will avoid nephrotoxins. - Will follow BMPs.

## 2023-12-10 NOTE — Progress Notes (Signed)
 Patient daughter Marygrace Drought on bedside stated she is on the process of getting power of attorney document. She said patient was in [facility] 6 months ago, unable to complete Power of [place] document that time.  Daughter wants to put visitor restriction to patient's sister, Solon Palm. MD aware and stated since he is divorced, daughter and son are NOK and can put restriction. Charge nurse aware, put security password and restriction flag being implemented for patient's sister.

## 2023-12-10 NOTE — Progress Notes (Signed)
 [facility]  ROUNDING NOTE   Subjective:   TYSON PARKISON  is a 68 year old male with past medical conditions including hypertension, seizure disorder, osteoarthritis, type 2 diabetes, dyslipidemia, and chronic kidney disease stage IIIb.  Patient presents to the emergency department with altered mental status and weakness and has been admitted for AKI (acute kidney injury) [facility]) [N17.9]  Patient is on our practice from previous admissions.  Patient is a poor historian so chart review used to obtain history.  It appears patient has been experiencing generalized weakness for 2 weeks or so.  Altered mental status was first noted yesterday.  Also states patient had a fall yesterday.  Patient denies any recent nausea, vomiting or diarrhea.  Denies pain at this time.  Given mixed responses pertaining to appetite and urination.  Labs on Ed arrival concerning for sodium 151, BUN 68, creatinine 1.473 with GFR 13, white count 3.4, and hemoglobin 9.3.  CT head negative for acute findings.  Sodium remains 152 today with slightly improved creatinine of 4.12.  We have been consulted to manage acute kidney injury.   Objective:  Vital signs in last 24 hours:  Temp:  [97.5 F (36.4 C)-97.8 F (36.6 C)] 97.8 F (36.6 C) (03/12 1242) Pulse Rate:  [54-63] 61 (03/12 1230) Resp:  [10-24] 17 (03/12 1230) BP: (95-122)/(51-83) 118/55 (03/12 1230) SpO2:  [99 %-100 %] 100 % (03/12 1230) Weight:  [90.7 kg] 90.7 kg (03/11 1918)  Weight change:  Filed Weights   12/09/23 1918  Weight: 90.7 kg    Intake/Output: I/O last 3 completed shifts: In: 1000 [IV Piggyback:1000] Out: -    Intake/Output this shift:  No intake/output data recorded.  Physical Exam: General: NAD  Head: Normocephalic, atraumatic. Dry oral mucosal membranes  Eyes: Anicteric  Lungs:  Clear to auscultation, normal effort  Heart: Regular rate and rhythm  Abdomen:  Soft, nontender,   Extremities:  1+ peripheral edema.   Neurologic: Somnolent but arousable, moving all four extremities  Skin: No lesions       Basic Metabolic Panel: Recent Labs  Lab 12/09/23 1955 12/10/23 0504  NA 151* 152*  K 4.3 3.7  CL 119* 124*  CO2 22 23  GLUCOSE 84 79  BUN 68* 60*  CREATININE 4.73* 4.12*  CALCIUM 9.0 8.4*    Liver Function Tests: Recent Labs  Lab 12/09/23 1955  AST 29  ALT 12  ALKPHOS 47  BILITOT 1.0  PROT 7.0  ALBUMIN 3.0*   No results for input(s): "LIPASE", "AMYLASE" in the last 168 hours. Recent Labs  Lab 12/09/23 2034 12/10/23 0504  AMMONIA 62* 126*    CBC: Recent Labs  Lab 12/09/23 1955 12/10/23 0504  WBC 3.4* 2.7*  NEUTROABS 1.3*  --   HGB 9.3* 8.5*  HCT 30.0* 27.1*  MCV 107.9* 109.3*  PLT 114* 80*    Cardiac Enzymes: No results for input(s): "CKTOTAL", "CKMB", "CKMBINDEX", "TROPONINI" in the last 168 hours.  BNP: Invalid input(s): "POCBNP"  CBG: Recent Labs  Lab 12/09/23 1921  GLUCAP 79    Microbiology: Results for orders placed or performed during the hospital encounter of 09/01/22  Blood Culture (routine x 2)     Status: None   Collection Time: 09/01/22  6:41 PM   Specimen: BLOOD  Result Value Ref Range Status   Specimen Description BLOOD BLOOD LEFT ARM  Final   Special Requests   Final    BOTTLES DRAWN AEROBIC AND ANAEROBIC Blood Culture adequate volume   Culture  Final    NO GROWTH 5 DAYS Performed at [facility], [address] [place], [place] [address]    Report Status 09/06/2022 FINAL  Final  SARS Coronavirus 2 by RT PCR (hospital order, performed in [facility] hospital lab) *cepheid single result test* Anterior Nasal Swab     Status: None   Collection Time: 09/01/22  6:42 PM   Specimen: Anterior Nasal Swab  Result Value Ref Range Status   SARS Coronavirus 2 by RT PCR NEGATIVE NEGATIVE Final    Comment: (NOTE) SARS-CoV-2 target nucleic acids are NOT DETECTED.  The SARS-CoV-2 RNA is generally detectable in upper and  lower respiratory specimens during the acute phase of infection. The lowest concentration of SARS-CoV-2 viral copies this assay can detect is 250 copies / mL. A negative result does not preclude SARS-CoV-2 infection and should not be used as the sole basis for treatment or other patient management decisions.  A negative result may occur with improper specimen collection / handling, submission of specimen other than nasopharyngeal swab, presence of viral mutation(s) within the areas targeted by this assay, and inadequate number of viral copies (<250 copies / mL). A negative result must be combined with clinical observations, patient history, and epidemiological information.  Fact Sheet for Patients:   RoadLapTop.co.za  Fact Sheet for Healthcare Providers: http://kim-miller.com/  This test is not yet approved or  cleared by the [place] FDA and has been authorized for detection and/or diagnosis of SARS-CoV-2 by FDA under an Emergency Use Authorization (EUA).  This EUA will remain in effect (meaning this test can be used) for the duration of the COVID-19 declaration under Section 564(b)(1) of the Act, 21 U.S.C. section 360bbb-3(b)(1), unless the authorization is terminated or revoked sooner.  Performed at [facility], [address] [place], [place] [address]   Culture, blood (Routine X 2) w Reflex to ID Panel     Status: None   Collection Time: 09/03/22  2:20 AM   Specimen: BLOOD  Result Value Ref Range Status   Specimen Description BLOOD BLOOD RIGHT ARM  Final   Special Requests   Final    IN BOTH AEROBIC AND ANAEROBIC BOTTLES Blood Culture adequate volume   Culture   Final    NO GROWTH 5 DAYS Performed at [facility], [address] [place], [place] [address]    Report Status 09/08/2022 FINAL  Final    Coagulation Studies: No results for input(s): "LABPROT", "INR" in the last 72  hours.  Urinalysis: Recent Labs    12/09/23 2327  COLORURINE YELLOW*  LABSPEC 1.015  PHURINE 5.0  GLUCOSEU 150*  HGBUR NEGATIVE  BILIRUBINUR NEGATIVE  KETONESUR NEGATIVE  PROTEINUR NEGATIVE  NITRITE NEGATIVE  LEUKOCYTESUR NEGATIVE      Imaging: CT HEAD WO CONTRAST ( ) Result Date: 12/09/2023 CLINICAL DATA:  Altered mental status EXAM: CT HEAD WITHOUT CONTRAST TECHNIQUE: Contiguous axial images were obtained from the base of the skull through the vertex without intravenous contrast. RADIATION DOSE REDUCTION: This exam was performed according to the departmental dose-optimization program which includes automated exposure control, adjustment of the mA and/or kV according to patient size and/or use of iterative reconstruction technique. COMPARISON:  03/04/2023 FINDINGS: Brain: Right frontal encephalomalacia. Right posterior approach shunt catheter with tip in the anterior right lateral ventricle. No hydrocephalus. No acute hemorrhage. Vascular: No hyperdense vessel or unexpected vascular calcification. Skull: The visualized skull base, calvarium and extracranial soft tissues are normal. Sinuses/Orbits: No fluid levels or advanced mucosal thickening  of the visualized paranasal sinuses. No mastoid or middle ear effusion. Normal orbits. Other: None. IMPRESSION: 1. No acute intracranial abnormality. 2. Right frontal encephalomalacia. 3. Right posterior approach shunt catheter with tip in the anterior right lateral ventricle. No hydrocephalus. Electronically Signed   By: Deatra Robinson M.D.   On: 12/09/2023 20:40     Medications:    sodium chloride 100 mL/hr at 12/10/23 1610    dapagliflozin propanediol  10 mg Oral Daily   divalproex  1,000 mg Oral BID   divalproex  500 mg Oral Q1200   donepezil  10 mg Oral QHS   fenofibrate  160 mg Oral Daily   fluticasone furoate-vilanterol  1 puff Inhalation Daily   And   umeclidinium bromide  1 puff Inhalation Daily   heparin  5,000 Units  Subcutaneous Q8H   lactulose  30 g Oral TID   pantoprazole  40 mg Oral Daily   rosuvastatin  20 mg Oral QHS   acetaminophen **OR** acetaminophen, albuterol, hydrOXYzine, magnesium hydroxide, naloxone, ondansetron **OR** ondansetron (ZOFRAN) IV, traZODone  Assessment/ Plan:  Mr. CLEMENTS TORO is a 69 y.o.  male  with past medical conditions including hypertension, seizure disorder, osteoarthritis, type 2 diabetes, dyslipidemia, and chronic kidney disease stage IIIb.  Patient presents to the emergency department with AMS and weakness. Has been admitted for AKI (acute kidney injury) (HCC) [N17.9]   Acute Kidney Injury on chronic kidney disease stage IIIb with baseline creatinine 1.9 and GFR of 38 on 08/11/23.  Acute kidney injury secondary to dehydration and medications.  Will order renal ultrasound to rule out obstruction.  Agree with IV fluids for hydration.  Continue to avoid nephrotoxic agents.  No acute indication for dialysis as creatinine is responding well to IV hydration.  Will continue to monitor this patient.  Lab Results  Component Value Date   CREATININE 4.12 (H) 12/10/2023   CREATININE 4.73 (H) 12/09/2023   CREATININE 1.74 (H) 03/11/2023    Intake/Output Summary (Last 24 hours) at 12/10/2023 1310 Last data filed at 12/09/2023 2200 Gross per 24 hour  Intake 1000 ml  Output --  Net 1000 ml   2.  Hypernatremia, likely secondary to decreased fluid intake.  Sodium 151 on admission.  Agree with half-normal saline currently infusing.  Patient encouraged to increase oral intake.  3.  Hypertension with chronic kidney disease.  Home regimen includes furosemide and lisinopril.  Both currently held.  4. Anemia of chronic kidney disease Macrocytic Lab Results  Component Value Date   HGB 8.5 (L) 12/10/2023  Hemoglobin slightly decreased.  Patient prescribed oral iron supplementation outpatient.   LOS: 1 Pauline Trainer 3/12/20251:10 PM

## 2023-12-10 NOTE — ED Notes (Signed)
 This RN assisted pt to stand next to the bed and pee into urinal

## 2023-12-10 NOTE — Assessment & Plan Note (Signed)
-   We will continue Aricept and Depakote ER

## 2023-12-10 NOTE — Assessment & Plan Note (Signed)
-   We will continue antihypertensive therapy.

## 2023-12-10 NOTE — ED Notes (Signed)
 ED TO INPATIENT HANDOFF REPORT  ED Nurse Name and Phone #: Dalia Heading [phone]  S Name/Age/Gender Albert Figueroa 68 y.o. male Room/Bed: ED08A/ED08A  Code Status   Code Status: Full Code  Home/SNF/Other Home Patient oriented to: self and place Is this baseline? No   Triage Complete: Triage complete  Chief Complaint AKI (acute kidney injury) (HCC) [N17.9]  Triage Note Pt to ED with son, per son pt has had some AMS and weakness since yesterday. Pt oriented to self and location. Pt has dried blood on hands, son is not sure where blood is coming from, no obvious lacerations or injuries noted. Pt slow to answer questions in triage. Pt denies pain. Son is not sure if pt takes blood thinners, none noted on pts chart    Allergies Allergies  Allergen Reactions   Gabapentin Other (See Comments)    Lethargic, Confused   Amlodipine     Other reaction(s): Other (see comments) Caused elevated blood pressure and headaches   Ozempic (0.25 Or 0.5 Mg-Dose) [Semaglutide(0.25 Or 0.5mg -Dos)]     Lethargy.  Also dropped blood sugars too low    Level of Care/Admitting Diagnosis ED Disposition     ED Disposition  Admit   Condition  --   Comment  Hospital Area: [facility] [[id number]]  Level of Care: Telemetry Medical [[id number]]  Covid Evaluation: Asymptomatic - no recent exposure (last 10 days) testing not required  Diagnosis: AKI (acute kidney injury) [facility]) [[id number]]  Admitting Physician: Hannah Beat [[id number]]  Attending Physician: Hannah Beat [[id number]]  Certification:: I certify this patient will need inpatient services for at least 2 midnights  Expected Medical Readiness: 12/11/2023          B Medical/Surgery History 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, spinal meningitis    Hyperlipidemia    Hypertension    Kidney disease    stage 3   Lewy body dementia (HCC)    short term memory issues only   Lumbar stenosis    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 in the 1980s"   Osteoarthrosis    Seizure (HCC)    none since 2009   Stage 4 chronic kidney disease (HCC)    Tremor of both hands    Past Surgical History:  Procedure Laterality Date   CATARACT EXTRACTION W/PHACO Right 05/14/2023   Procedure: CATARACT EXTRACTION PHACO AND INTRAOCULAR LENS PLACEMENT (IOC) RIGHT DIABETIC;  Surgeon: Lockie Mola, MD;  Location: [facility];  Service: Ophthalmology;  Laterality: Right;  6.70 0:39.8   CATARACT EXTRACTION W/PHACO Left 05/28/2023   Procedure: CATARACT EXTRACTION PHACO AND INTRAOCULAR LENS PLACEMENT (IOC) LEFT DIABETIC 6.34 00:37.1;  Surgeon: Lockie Mola, MD;  Location: [facility];  Service: Ophthalmology;  Laterality: Left;   CSF SHUNT     placed in 1980s   KNEE SURGERY     multiple surgeries on both knees     A IV Location/Drains/Wounds Patient Lines/Drains/Airways Status     Active Line/Drains/Airways     Name Placement date Placement time Site Days   Peripheral IV 12/09/23 20 G 0.75" Right Antecubital 12/09/23  2048  Antecubital  1            Intake/Output Last 24 hours  Intake/Output  Summary (Last 24 hours) at 12/10/2023 1251 Last data filed at 12/09/2023 2200 Gross per 24 hour  Intake 1000 ml  Output --  Net 1000 ml    Labs/Imaging Results for orders placed or performed during the hospital encounter of 12/09/23 (from the past 48 hours)  CBG monitoring, ED     Status: None   Collection Time: 12/09/23  7:21 PM  Result Value Ref Range   Glucose-Capillary 79 70 - 99 mg/dL    Comment: Glucose reference range applies only to samples taken after fasting for at least 8 hours.  CBC with Differential     Status: Abnormal   Collection Time: 12/09/23  7:55 PM  Result Value Ref Range   WBC 3.4 (L) 4.0 - 10.5 K/uL    RBC 2.78 (L) 4.22 - 5.81 MIL/uL   Hemoglobin 9.3 (L) 13.0 - 17.0 g/dL   HCT 16.1 (L) 09.6 - 04.5 %   MCV 107.9 (H) 80.0 - 100.0 fL   MCH 33.5 26.0 - 34.0 pg   MCHC 31.0 30.0 - 36.0 g/dL   RDW 40.9 81.1 - 91.4 %   Platelets 114 (L) 150 - 400 K/uL   nRBC 0.0 0.0 - 0.2 %   Neutrophils Relative % 37 %   Neutro Abs 1.3 (L) 1.7 - 7.7 K/uL   Lymphocytes Relative 48 %   Lymphs Abs 1.7 0.7 - 4.0 K/uL   Monocytes Relative 10 %   Monocytes Absolute 0.3 0.1 - 1.0 K/uL   Eosinophils Relative 4 %   Eosinophils Absolute 0.1 0.0 - 0.5 K/uL   Basophils Relative 1 %   Basophils Absolute 0.0 0.0 - 0.1 K/uL   Immature Granulocytes 0 %   Abs Immature Granulocytes 0.01 0.00 - 0.07 K/uL    Comment: Performed at [facility], [address], [place], [place] [address]  Basic metabolic panel     Status: Abnormal   Collection Time: 12/09/23  7:55 PM  Result Value Ref Range   Sodium 151 (H) 135 - 145 mmol/L   Potassium 4.3 3.5 - 5.1 mmol/L   Chloride 119 (H) 98 - 111 mmol/L   CO2 22 22 - 32 mmol/L   Glucose, Bld 84 70 - 99 mg/dL    Comment: Glucose reference range applies only to samples taken after fasting for at least 8 hours.   BUN 68 (H) 8 - 23 mg/dL   Creatinine, Ser 6.21 (H) 0.61 - 1.24 mg/dL   Calcium 9.0 8.9 - 30.8 mg/dL   GFR, Estimated 13 (L) >60 mL/min    Comment: (NOTE) Calculated using the CKD-EPI Creatinine Equation (2021)    Anion gap 10 5 - 15    Comment: Performed at [facility], [address], [place], [place] [address]  Troponin I (High Sensitivity)     Status: None   Collection Time: 12/09/23  7:55 PM  Result Value Ref Range   Troponin I (High Sensitivity) 9 <18 ng/L    Comment: (NOTE) Elevated high sensitivity troponin I (hsTnI) values and significant  changes across serial measurements may suggest ACS but many other  chronic and acute conditions are known to elevate hsTnI results.  Refer to the "Links" section for chest pain algorithms and  additional  guidance. Performed at [facility], [address] [place], [place] [address]   Ethanol     Status: None   Collection Time: 12/09/23  7:55 PM  Result Value Ref Range   Alcohol, Ethyl (B) <10 <10  mg/dL    Comment: (NOTE) Lowest detectable limit for serum alcohol is 10 mg/dL.  For medical purposes only. Performed at [facility], [address] [place], [place] [address]   Hepatic function panel     Status: Abnormal   Collection Time: 12/09/23  7:55 PM  Result Value Ref Range   Total Protein 7.0 6.5 - 8.1 g/dL   Albumin 3.0 (L) 3.5 - 5.0 g/dL   AST 29 15 - 41 U/L   ALT 12 0 - 44 U/L   Alkaline Phosphatase 47 38 - 126 U/L   Total Bilirubin 1.0 0.0 - 1.2 mg/dL   Bilirubin, Direct 0.4 (H) 0.0 - 0.2 mg/dL   Indirect Bilirubin 0.6 0.3 - 0.9 mg/dL    Comment: Performed at [facility], [address] [place], [place] [address]  Ammonia     Status: Abnormal   Collection Time: 12/09/23  8:34 PM  Result Value Ref Range   Ammonia 62 (H) 9 - 35 umol/L    Comment: Performed at [facility], [address], [place], [place] [address]  Troponin I (High Sensitivity)     Status: None   Collection Time: 12/09/23 10:04 PM  Result Value Ref Range   Troponin I (High Sensitivity) 8 <18 ng/L    Comment: (NOTE) Elevated high sensitivity troponin I (hsTnI) values and significant  changes across serial measurements may suggest ACS but many other  chronic and acute conditions are known to elevate hsTnI results.  Refer to the "Links" section for chest pain algorithms and additional  guidance. Performed at [facility], [address] [place], [place] [address]   Urinalysis, Routine w reflex microscopic -Urine, Clean Catch     Status: Abnormal   Collection Time: 12/09/23 11:27 PM  Result Value Ref Range   Color, Urine YELLOW (A) YELLOW   APPearance CLEAR (A) CLEAR   Specific Gravity, Urine 1.015 1.005 - 1.030   pH 5.0 5.0 - 8.0    Glucose, UA 150 (A) NEGATIVE mg/dL   Hgb urine dipstick NEGATIVE NEGATIVE   Bilirubin Urine NEGATIVE NEGATIVE   Ketones, ur NEGATIVE NEGATIVE mg/dL   Protein, ur NEGATIVE NEGATIVE mg/dL   Nitrite NEGATIVE NEGATIVE   Leukocytes,Ua NEGATIVE NEGATIVE    Comment: Performed at [facility], [address], [place], [place] [address]  Basic metabolic panel     Status: Abnormal   Collection Time: 12/10/23  5:04 AM  Result Value Ref Range   Sodium 152 (H) 135 - 145 mmol/L   Potassium 3.7 3.5 - 5.1 mmol/L   Chloride 124 (H) 98 - 111 mmol/L   CO2 23 22 - 32 mmol/L   Glucose, Bld 79 70 - 99 mg/dL    Comment: Glucose reference range applies only to samples taken after fasting for at least 8 hours.   BUN 60 (H) 8 - 23 mg/dL   Creatinine, Ser 1.32 (H) 0.61 - 1.24 mg/dL   Calcium 8.4 (L) 8.9 - 10.3 mg/dL   GFR, Estimated 15 (L) >60 mL/min    Comment: (NOTE) Calculated using the CKD-EPI Creatinine Equation (2021)    Anion gap 5 5 - 15    Comment: Performed at [facility], [address] [place], [place] [address]  CBC     Status: Abnormal   Collection Time: 12/10/23  5:04 AM  Result Value Ref Range   WBC 2.7 (L) 4.0 - 10.5 K/uL   RBC 2.48 (L) 4.22 - 5.81 MIL/uL  Hemoglobin 8.5 (L) 13.0 - 17.0 g/dL   HCT 82.9 (L) 56.2 - 13.0 %   MCV 109.3 (H) 80.0 - 100.0 fL   MCH 34.3 (H) 26.0 - 34.0 pg   MCHC 31.4 30.0 - 36.0 g/dL   RDW 86.5 78.4 - 69.6 %   Platelets 80 (L) 150 - 400 K/uL    Comment: Immature Platelet Fraction may be clinically indicated, consider ordering this additional test [id number] REPEATED TO VERIFY    nRBC 0.0 0.0 - 0.2 %    Comment: Performed at [facility], [address] [place], [place] [address]  Ammonia     Status: Abnormal   Collection Time: 12/10/23  5:04 AM  Result Value Ref Range   Ammonia 126 (H) 9 - 35 umol/L    Comment: Performed at [facility], [address] [place], [place] [address]  HIV Antibody (routine  testing w rflx)     Status: None   Collection Time: 12/10/23  5:04 AM  Result Value Ref Range   HIV Screen 4th Generation wRfx Non Reactive Non Reactive    Comment: Performed at [facility], [address] [place], [place] [address]   CT HEAD WO CONTRAST ( ) Result Date: 12/09/2023 CLINICAL DATA:  Altered mental status EXAM: CT HEAD WITHOUT CONTRAST TECHNIQUE: Contiguous axial images were obtained from the base of the skull through the vertex without intravenous contrast. RADIATION DOSE REDUCTION: This exam was performed according to the departmental dose-optimization program which includes automated exposure control, adjustment of the mA and/or kV according to patient size and/or use of iterative reconstruction technique. COMPARISON:  03/04/2023 FINDINGS: Brain: Right frontal encephalomalacia. Right posterior approach shunt catheter with tip in the anterior right lateral ventricle. No hydrocephalus. No acute hemorrhage. Vascular: No hyperdense vessel or unexpected vascular calcification. Skull: The visualized skull base, calvarium and extracranial soft tissues are normal. Sinuses/Orbits: No fluid levels or advanced mucosal thickening of the visualized paranasal sinuses. No mastoid or middle ear effusion. Normal orbits. Other: None. IMPRESSION: 1. No acute intracranial abnormality. 2. Right frontal encephalomalacia. 3. Right posterior approach shunt catheter with tip in the anterior right lateral ventricle. No hydrocephalus. Electronically Signed   By: Deatra Robinson M.D.   On: 12/09/2023 20:40    Pending Labs Unresulted Labs (From admission, onward)     Start     Ordered   12/10/23 0500  Ammonia  Daily,   R      12/09/23 2319            Vitals/Pain Today's Vitals   12/10/23 1139 12/10/23 1200 12/10/23 1230 12/10/23 1242  BP:  (!) 105/51 (!) 118/55   Pulse: (!) 55 (!) 57 61   Resp: 12 12 17    Temp:    97.8 F (36.6 C)  TempSrc:    Oral  SpO2: 100% 100% 100%   Weight:       Height:      PainSc:        Isolation Precautions No active isolations  Medications Medications  donepezil (ARICEPT) tablet 10 mg (10 mg Oral Given 12/10/23 0100)  hydrOXYzine (ATARAX) tablet 10 mg (has no administration in time range)  dapagliflozin propanediol (FARXIGA) tablet 10 mg (10 mg Oral Given 12/10/23 0922)  rosuvastatin (CRESTOR) tablet 20 mg (20 mg Oral Not Given 12/10/23 0145)  fenofibrate tablet 160 mg (160 mg Oral Given 12/10/23 0922)  pantoprazole (PROTONIX) EC tablet 40 mg (40 mg Oral Given 12/10/23 0922)  naloxone (NARCAN) nasal  spray 4 mg/0.1 mL (has no administration in time range)  divalproex (DEPAKOTE ER) 24 hr tablet 1,000 mg (1,000 mg Oral Given 12/10/23 0922)  albuterol (PROVENTIL) (2.5 MG/3ML) 0.083% nebulizer solution 2.5 mg (has no administration in time range)  fluticasone furoate-vilanterol (BREO ELLIPTA) 100-25 MCG/ACT 1 puff (has no administration in time range)    And  umeclidinium bromide (INCRUSE ELLIPTA) 62.5 MCG/ACT 1 puff (has no administration in time range)  heparin injection 5,000 Units (5,000 Units Subcutaneous Given 12/10/23 0604)  acetaminophen (TYLENOL) tablet 650 mg (has no administration in time range)    Or  acetaminophen (TYLENOL) suppository 650 mg (has no administration in time range)  traZODone (DESYREL) tablet 25 mg (has no administration in time range)  magnesium hydroxide (MILK OF MAGNESIA) suspension 30 mL (has no administration in time range)  ondansetron (ZOFRAN) tablet 4 mg (has no administration in time range)    Or  ondansetron (ZOFRAN) injection 4 mg (has no administration in time range)  lactulose (CHRONULAC) 10 GM/15ML solution 30 g (30 g Oral Given 12/10/23 1142)  divalproex (DEPAKOTE ER) 24 hr tablet 500 mg (has no administration in time range)  0.45 % sodium chloride infusion ( Intravenous New Bag/Given 12/10/23 0253)  sodium chloride 0.9 % bolus 1,000 mL (0 mLs Intravenous Stopped 12/09/23 2200)    Mobility walks      Focused Assessments Renal Assessment Handoff:     R Recommendations: See Admitting Provider Note  Report given to:   Additional Notes: Pt is easy to arouse but will fall asleep while talking. Pt daughter at bedside.

## 2023-12-10 NOTE — ED Notes (Addendum)
 Pt was able to stand next to the bed and pee into urinal with RN assistance

## 2023-12-10 NOTE — Assessment & Plan Note (Signed)
-   Will be placed on p.o. lactulose. - Will follow ammonia level.

## 2023-12-10 NOTE — Assessment & Plan Note (Signed)
-   The patient will be placed on supplemental coverage with NovoLog. - We will continue [place].

## 2023-12-10 NOTE — Progress Notes (Signed)
 Progress Note   Patient: Albert Figueroa ZOX:[id number] DOB: 08-28-1956 DOA: 12/09/2023     1 DOS: the patient was seen and examined on 12/10/2023    Brief hospital course: Albert Figueroa is a 68 y.o. Caucasian male with medical history significant for type 2 diabetes mellitus, COPD, hypertension, dyslipidemia, stage III chronic kidney disease, Lewy body dementia seizure disorder and stage IV chronic kidney disease who presented to the emergency room with acute onset of altered mental status with confusion as well as generalized weakness over the last couple weeks.  ED Course: When he came to the ER, BP was 95/56 with otherwise normal vital signs.  BP later on was 111/64.  Labs revealed hypernatremia of 151 and hyperkalemia 119 with a BUN of 68 and creatinine 4.73 above previous levels, albumin 3 and direct bili 0.4.  Ammonia level came back elevated at 62.  EKG as reviewed by me : EKG showed sinus bradycardia with rate of 57 and T wave version inferiorly.  TRH was therefore consulted to admit patient for further management   Assessment and Plan:  Acute kidney injury superimposed on chronic kidney disease (HCC) - The patient be admitted to a medical telemetry bed. - We will continue hydration with IV 1/2 normal saline. - We will avoid nephrotoxins. - Nephrology is consulted and case discussed   Acute hepatic encephalopathy (HCC)-improved - Continue on p.o. lactulose. - Ammonia level 62   Acute metabolic encephalopathy - This is likely secondary to #1 and #2. - Management as above.   Hypernatremia - This is clearly secondary to hypovolemia. - Continue with IV half-normal saline.  Will follow BMPs.   Dyslipidemia - We will continue statin therapy and fenofibrate.   Essential hypertension - We will continue antihypertensive therapy.   Dementia with behavioral disturbance (HCC) - We will continue Aricept and Depakote ER   Type 2 diabetes mellitus with chronic kidney disease, without  long-term current use of insulin (HCC) - The patient will be placed on supplemental coverage with NovoLog. - We will continue [place].       DVT prophylaxis: Lovenox.   Advanced Care Planning:  Code Status: full code.   Family Communication: No family at bedside at this time  Disposition Plan: Back to previous home environment  Consults called: none.   Subjective:  Patient seen and examined at bedside this morning Mental status improving Patient easily falls asleep on taking subjective information He tells me better today compared to when he came Denies nausea vomiting chest pain cough  Physical Exam: Vitals:   12/10/23 0430 12/10/23 0757 12/10/23 0812 12/10/23 0832  BP: 104/62   (!) 103/53  Pulse: (!) 54  63 (!) 59  Resp: 13  14 12   Temp:  (!) 97.5 F (36.4 C)    TempSrc:  Oral    SpO2: 100%  100% 100%  Weight:      Height:       GENERAL:  68 y.o.-year-old Caucasian male patient lying in the bed with no acute distress.  Appears drowsy EYES: Pupils equal, round, reactive to light and accommodation. No scleral icterus. Extraocular muscles intact.  HEENT: Head atraumatic, normocephalic. Oropharynx and nasopharynx clear.  NECK:  Supple, no jugular venous distention. No thyroid enlargement, no tenderness.  LUNGS: Normal breath sounds bilaterally, no wheezing, rales,rhonchi or crepitation. No use of accessory muscles of respiration.  CARDIOVASCULAR: Regular rate and rhythm, S1, S2 normal. No murmurs, rubs, or gallops.  ABDOMEN: Soft, nondistended, nontender. Bowel sounds present.  No organomegaly or mass.  EXTREMITIES: No pedal edema, cyanosis, or clubbing.  NEUROLOGIC: Cranial nerves II through XII are intact. Muscle strength 5/5 in all extremities. Sensation intact. Gait not checked.   Data Reviewed: I have reviewed patient's CT scan of the brain that did not show acute intracranial pathology    Latest Ref Rng & Units 12/10/2023    5:04 AM 12/09/2023    7:55 PM 10/13/2023     2:42 PM  CBC  WBC 4.0 - 10.5 K/uL 2.7  3.4  5.7   Hemoglobin 13.0 - 17.0 g/dL 8.5  9.3  11.9   Hematocrit 39.0 - 52.0 % 27.1  30.0  39.1   Platelets 150 - 400 K/uL 80  114  97        Latest Ref Rng & Units 12/10/2023    5:04 AM 12/09/2023    7:55 PM 03/11/2023    4:36 AM  BMP  Glucose 70 - 99 mg/dL 79  84  85   BUN 8 - 23 mg/dL 60  68  36   Creatinine 0.61 - 1.24 mg/dL 1.47  8.29  5.62   Sodium 135 - 145 mmol/L 152  151  138   Potassium 3.5 - 5.1 mmol/L 3.7  4.3  4.1   Chloride 98 - 111 mmol/L 124  119  107   CO2 22 - 32 mmol/L 23  22  25    Calcium 8.9 - 10.3 mg/dL 8.4  9.0  8.3      Time spent: 55 minutes spent evaluating this patient as well as reviewing above-mentioned CT scan, labs and discussing plan of care with nephrology  Author: Loyce Dys, MD 12/10/2023 10:13 AM  For on call review [email or web address].

## 2023-12-10 NOTE — Plan of Care (Signed)
  Problem: Clinical Measurements: Goal: Will remain free from infection Outcome: Progressing   Problem: Elimination: Goal: Will not experience complications related to bowel motility Outcome: Progressing

## 2023-12-10 NOTE — Assessment & Plan Note (Addendum)
-   We will continue statin therapy and fenofibrate. 

## 2023-12-10 NOTE — Assessment & Plan Note (Signed)
-   This is likely secondary to #1 and #2. - Management as above.

## 2023-12-10 NOTE — Evaluation (Signed)
 Physical Therapy Evaluation Patient Details Name: Albert Figueroa MRN: [id number] DOB: 10/17/1955 Today's Date: 12/10/2023  History of Present Illness  Albert Figueroa is a 68 y.o. Caucasian male with medical history significant for type 2 diabetes mellitus, COPD, hypertension, dyslipidemia, stage III chronic kidney disease, Lewy body dementia seizure disorder and stage IV chronic kidney disease who presented to the emergency room 12/09/23 with acute onset of altered mental status with confusion since yesterday as well as generalized weakness over the last couple weeks.  He had a fall 12/08/23.   Clinical Impression  Patient received sleeping on stretcher in ED. RN present in room. Patient is very lethargic, falling asleep constantly throughout session. Patient required min A for bed mobility and transfer sit to stand. He was unable to shift weight in standing. Patient will continue to benefit from skilled PT to improve strength and independence.         If plan is discharge home, recommend the following: A lot of help with walking and/or transfers;A lot of help with bathing/dressing/bathroom   Can travel by private vehicle   No    Equipment Recommendations Other (comment) (TBD)  Recommendations for Other Services       Functional Status Assessment Patient has had a recent decline in their functional status and demonstrates the ability to make significant improvements in function in a reasonable and predictable amount of time.     Precautions / Restrictions Precautions Precautions: Fall Recall of Precautions/Restrictions: Impaired Restrictions Weight Bearing Restrictions Per Provider Order: No      Mobility  Bed Mobility Overal bed mobility: Needs Assistance Bed Mobility: Supine to Sit, Sit to Supine     Supine to sit: Contact guard, HOB elevated Sit to supine: Contact guard assist        Transfers Overall transfer level: Needs assistance Equipment used: None Transfers: Sit  to/from Stand Sit to Stand: Contact guard assist, Min assist           General transfer comment: cues for upright posture. Leaning posteriorly on stretcher.  Unable to shift weight in standing    Ambulation/Gait               General Gait Details: attempted to take steps away from stretcher, was unable  Stairs            Wheelchair Mobility     Tilt Bed    Modified Rankin (Stroke Patients Only)       Balance Overall balance assessment: Needs assistance Sitting-balance support: Feet supported Sitting balance-Leahy Scale: Fair Sitting balance - Comments: forward head/leaning in sitting.   Standing balance support: No upper extremity supported, During functional activity Standing balance-Leahy Scale: Poor Standing balance comment: posterior lean in standing                             Pertinent Vitals/Pain Pain Assessment Pain Assessment: No/denies pain    Home Living                          Prior Function Prior Level of Function : Independent/Modified Independent             Mobility Comments: patient poor historian, no family present on Eval. states he does not use AD normally       Extremity/Trunk Assessment   Upper Extremity Assessment Upper Extremity Assessment: Defer to OT evaluation    Lower Extremity Assessment Lower Extremity  Assessment: Generalized weakness    Cervical / Trunk Assessment Cervical / Trunk Assessment: Normal  Communication   Communication Communication: No apparent difficulties    Cognition Arousal: Lethargic Behavior During Therapy: Flat affect   PT - Cognitive impairments: No family/caregiver present to determine baseline, Difficult to assess Difficult to assess due to: Level of arousal                       Following commands: Impaired Following commands impaired: Follows one step commands with increased time     Cueing Cueing Techniques: Verbal cues, Gestural cues,  Tactile cues     General Comments      Exercises     Assessment/Plan    PT Assessment Patient needs continued PT services  PT Problem List Decreased strength;Decreased activity tolerance;Decreased balance;Decreased mobility;Decreased cognition       PT Treatment Interventions DME instruction;Gait training;Stair training;Functional mobility training;Therapeutic exercise;Therapeutic activities;Balance training;Neuromuscular re-education;Cognitive remediation;Patient/family education    PT Goals (Current goals can be found in the Care Plan section)  Acute Rehab PT Goals Patient Stated Goal: none stated PT Goal Formulation: Patient unable to participate in goal setting Time For Goal Achievement: 12/24/23    Frequency Min 2X/week     Co-evaluation               AM-PAC PT "6 Clicks" Mobility  Outcome Measure Help needed turning from your back to your side while in a flat bed without using bedrails?: A Little Help needed moving from lying on your back to sitting on the side of a flat bed without using bedrails?: A Little Help needed moving to and from a bed to a chair (including a wheelchair)?: A Lot Help needed standing up from a chair using your arms (e.g., wheelchair or bedside chair)?: A Little Help needed to walk in hospital room?: Total Help needed climbing 3-5 steps with a railing? : Total 6 Click Score: 13    End of Session   Activity Tolerance: Patient limited by lethargy Patient left: in bed;with call bell/phone within reach;with bed alarm set Nurse Communication: Mobility status PT Visit Diagnosis: Unsteadiness on feet (R26.81);Other abnormalities of gait and mobility (R26.89);Muscle weakness (generalized) (M62.81);History of falling (Z91.81);Difficulty in walking, not elsewhere classified (R26.2)    Time: 2841-3244 PT Time Calculation (min) (ACUTE ONLY): 9 min   Charges:   PT Evaluation $PT Eval Moderate Complexity: 1 Mod   PT General Charges $$ ACUTE  PT VISIT: 1 Visit         Lauraine Crespo, PT, GCS 12/10/23,1:45 PM

## 2023-12-10 NOTE — ED Notes (Signed)
PT at bedside with pt at this time

## 2023-12-10 NOTE — Assessment & Plan Note (Signed)
-   This is clearly secondary to hypovolemia. - The patient will be hydrated with IV half-normal saline.  Will follow BMPs.

## 2023-12-11 DIAGNOSIS — N189 Chronic kidney disease, unspecified: Secondary | ICD-10-CM | POA: Diagnosis not present

## 2023-12-11 DIAGNOSIS — N179 Acute kidney failure, unspecified: Secondary | ICD-10-CM | POA: Diagnosis not present

## 2023-12-11 LAB — CBC WITH DIFFERENTIAL/PLATELET
Abs Immature Granulocytes: 0.01 10*3/uL (ref 0.00–0.07)
Basophils Absolute: 0 10*3/uL (ref 0.0–0.1)
Basophils Relative: 1 %
Eosinophils Absolute: 0.1 10*3/uL (ref 0.0–0.5)
Eosinophils Relative: 3 %
HCT: 31.4 % — ABNORMAL LOW (ref 39.0–52.0)
Hemoglobin: 9.8 g/dL — ABNORMAL LOW (ref 13.0–17.0)
Immature Granulocytes: 1 %
Lymphocytes Relative: 53 %
Lymphs Abs: 1.2 10*3/uL (ref 0.7–4.0)
MCH: 33.2 pg (ref 26.0–34.0)
MCHC: 31.2 g/dL (ref 30.0–36.0)
MCV: 106.4 fL — ABNORMAL HIGH (ref 80.0–100.0)
Monocytes Absolute: 0.2 10*3/uL (ref 0.1–1.0)
Monocytes Relative: 8 %
Neutro Abs: 0.8 10*3/uL — ABNORMAL LOW (ref 1.7–7.7)
Neutrophils Relative %: 34 %
Platelets: 88 10*3/uL — ABNORMAL LOW (ref 150–400)
RBC: 2.95 MIL/uL — ABNORMAL LOW (ref 4.22–5.81)
RDW: 14.4 % (ref 11.5–15.5)
Smear Review: DECREASED
WBC: 2.2 10*3/uL — ABNORMAL LOW (ref 4.0–10.5)
nRBC: 0 % (ref 0.0–0.2)

## 2023-12-11 LAB — BASIC METABOLIC PANEL
Anion gap: 9 (ref 5–15)
BUN: 42 mg/dL — ABNORMAL HIGH (ref 8–23)
CO2: 22 mmol/L (ref 22–32)
Calcium: 8.8 mg/dL — ABNORMAL LOW (ref 8.9–10.3)
Chloride: 120 mmol/L — ABNORMAL HIGH (ref 98–111)
Creatinine, Ser: 2.79 mg/dL — ABNORMAL HIGH (ref 0.61–1.24)
GFR, Estimated: 24 mL/min — ABNORMAL LOW (ref >60)
Glucose, Bld: 72 mg/dL (ref 70–99)
Potassium: 3.9 mmol/L (ref 3.5–5.1)
Sodium: 151 mmol/L — ABNORMAL HIGH (ref 135–145)

## 2023-12-11 LAB — AMMONIA: Ammonia: 99 umol/L — ABNORMAL HIGH (ref 9–35)

## 2023-12-11 LAB — PATHOLOGIST SMEAR REVIEW

## 2023-12-11 NOTE — Plan of Care (Signed)
   Problem: Activity: Goal: Risk for activity intolerance will decrease Outcome: Progressing

## 2023-12-11 NOTE — Progress Notes (Signed)
 Progress Note   Patient: Albert Figueroa ZOX:[id number] DOB: Feb 08, 1956 DOA: 12/09/2023     2 DOS: the patient was seen and examined on 12/11/2023    Brief hospital course: Albert Figueroa is a 68 y.o. Caucasian male with medical history significant for type 2 diabetes mellitus, COPD, hypertension, dyslipidemia, stage III chronic kidney disease, Lewy body dementia seizure disorder and stage IV chronic kidney disease who presented to the emergency room with acute onset of altered mental status with confusion as well as generalized weakness over the last couple weeks.  ED Course: When he came to the ER, BP was 95/56 with otherwise normal vital signs.  BP later on was 111/64.  Labs revealed hypernatremia of 151 and hyperkalemia 119 with a BUN of 68 and creatinine 4.73 above previous levels, albumin 3 and direct bili 0.4.  Ammonia level came back elevated at 62.  EKG as reviewed by me : EKG showed sinus bradycardia with rate of 57 and T wave version inferiorly.  TRH was therefore consulted to admit patient for further management     Assessment and Plan:  Acute kidney injury superimposed on chronic kidney disease (HCC) - The patient be admitted to a medical telemetry bed. - Continue hydration with IV 1/2 normal saline. - We will avoid nephrotoxins. - I have discussed the case with nephrologist   Acute hepatic encephalopathy (HCC)-improved - Continue on p.o. lactulose. - Ammonia level 62   Acute metabolic encephalopathy Likely secondary to above - Management as above.   Hypernatremia - This is clearly secondary to hypovolemia. - Continue with IV half-normal saline.  Will follow BMPs.   Dyslipidemia - We will continue statin therapy and fenofibrate.   Essential hypertension - We will continue antihypertensive therapy.   Dementia with behavioral disturbance (HCC) - We will continue Aricept and Depakote ER   Type 2 diabetes mellitus with chronic kidney disease, without long-term current use  of insulin (HCC) - The patient will be placed on supplemental coverage with NovoLog. - We will continue [place].       DVT prophylaxis: Lovenox.    Advanced Care Planning:  Code Status: full code.    Family Communication: No family at bedside at this time   Disposition Plan: Back to previous home environment   Consults called: none.    Subjective:  Patient seen and examined at bedside this morning Mental status still far from baseline Able to provide minimal subjective information on account of altered mental status   Physical Exam:  GENERAL:  68 y.o.-year-old Caucasian male patient lying in the bed with no acute distress.  Appears drowsy EYES: Pupils equal, round, reactive to light and accommodation. No scleral icterus. Extraocular muscles intact.  HEENT: Head atraumatic, normocephalic. Oropharynx and nasopharynx clear.  NECK:  Supple, no jugular venous distention. No thyroid enlargement, no tenderness.  LUNGS: Normal breath sounds bilaterally, no wheezing, rales,rhonchi or crepitation. No use of accessory muscles of respiration.  CARDIOVASCULAR: Regular rate and rhythm, S1, S2 normal. No murmurs, rubs, or gallops.  ABDOMEN: Soft, nondistended, nontender. Bowel sounds present. No organomegaly or mass.  EXTREMITIES: No pedal edema, cyanosis, or clubbing.  NEUROLOGIC: Still altered but able to move extremities   Data Reviewed:    Latest Ref Rng & Units 12/11/2023    4:07 AM 12/10/2023    5:04 AM 12/09/2023    7:55 PM  BMP  Glucose 70 - 99 mg/dL 72  79  84   BUN 8 - 23 mg/dL 42  60  68   Creatinine 0.61 - 1.24 mg/dL 4.09  8.11  9.14   Sodium 135 - 145 mmol/L 151  152  151   Potassium 3.5 - 5.1 mmol/L 3.9  3.7  4.3   Chloride 98 - 111 mmol/L 120  124  119   CO2 22 - 32 mmol/L 22  23  22    Calcium 8.9 - 10.3 mg/dL 8.8  8.4  9.0        Latest Ref Rng & Units 12/11/2023    4:07 AM 12/10/2023    5:04 AM 12/09/2023    7:55 PM  CBC  WBC 4.0 - 10.5 K/uL 2.2  2.7  3.4    Hemoglobin 13.0 - 17.0 g/dL 9.8  8.5  9.3   Hematocrit 39.0 - 52.0 % 31.4  27.1  30.0   Platelets 150 - 400 K/uL 88  80  114      Vitals:   12/10/23 1941 12/11/23 0400 12/11/23 0726 12/11/23 1636  BP: 122/60 (!) 111/57 (!) 119/55 130/67  Pulse: (!) 51 (!) 55 (!) 52 (!) 55  Resp: 18 18 18 16   Temp: 97.9 F (36.6 C) 98.9 F (37.2 C) 98.1 F (36.7 C) 97.8 F (36.6 C)  TempSrc: Oral Oral    SpO2: 99% 97% 94% 100%  Weight:      Height:         Time spent: 42 minutes  Author: Loyce Dys, MD 12/11/2023 4:46 PM  For on call review [email or web address].

## 2023-12-11 NOTE — Progress Notes (Signed)
 [facility]  ROUNDING NOTE   Subjective:   Albert Figueroa  is a 68 year old male with past medical conditions including hypertension, seizure disorder, osteoarthritis, type 2 diabetes, dyslipidemia, and chronic kidney disease stage IIIb.  Patient presents to the emergency department with altered mental status and weakness and has been admitted for Hyperammonemia (HCC) [E72.20] AKI (acute kidney injury) (HCC) [N17.9] Acute renal failure, unspecified acute renal failure type (HCC) [N17.9] Altered mental status, unspecified altered mental status type [R41.82]  Patient is on our practice from previous admissions.  Patient is a poor historian so chart review used to obtain history.  It appears patient has been experiencing generalized weakness for 2 weeks or so.  Altered mental status was first noted yesterday.  Also states patient had a fall yesterday.  Patient denies any recent nausea, vomiting or diarrhea.  Denies pain at this time.  Given mixed responses pertaining to appetite and urination.  Patient is doing fair today.  Somewhat lethargic but able to answer a few simple questions.  Mouth appears dry.  Serum creatinine has improved to 2.79.  Sodium is still elevated at 151.   Objective:  Vital signs in last 24 hours:  Temp:  [97.8 F (36.6 C)-98.9 F (37.2 C)] 98.1 F (36.7 C) (03/13 0726) Pulse Rate:  [51-63] 52 (03/13 0726) Resp:  [16-22] 18 (03/13 0726) BP: (111-131)/(55-74) 119/55 (03/13 0726) SpO2:  [94 %-100 %] 94 % (03/13 0726)  Weight change:  Filed Weights   12/09/23 1918  Weight: 90.7 kg    Intake/Output: I/O last 3 completed shifts: In: 1000 [IV Piggyback:1000] Out: 500 [Urine:500]   Intake/Output this shift:  No intake/output data recorded.  Physical Exam: General: NAD  Head: Normocephalic, atraumatic. Dry oral mucosal membranes  Eyes: Anicteric  Lungs:  Clear to auscultation, normal effort  Heart: Regular rate and rhythm  Abdomen:  Soft,  nontender,   Extremities:  Trace peripheral edema.  Neurologic: Somnolent but arousable, moving all four extremities  Skin: No lesions       Basic Metabolic Panel: Recent Labs  Lab 12/09/23 1955 12/10/23 0504 12/11/23 0407  NA 151* 152* 151*  K 4.3 3.7 3.9  CL 119* 124* 120*  CO2 22 23 22   GLUCOSE 84 79 72  BUN 68* 60* 42*  CREATININE 4.73* 4.12* 2.79*  CALCIUM 9.0 8.4* 8.8*    Liver Function Tests: Recent Labs  Lab 12/09/23 1955  AST 29  ALT 12  ALKPHOS 47  BILITOT 1.0  PROT 7.0  ALBUMIN 3.0*   No results for input(s): "LIPASE", "AMYLASE" in the last 168 hours. Recent Labs  Lab 12/09/23 2034 12/10/23 0504 12/11/23 0507  AMMONIA 62* 126* 99*    CBC: Recent Labs  Lab 12/09/23 1955 12/10/23 0504 12/11/23 0407  WBC 3.4* 2.7* 2.2*  NEUTROABS 1.3*  --  0.8*  HGB 9.3* 8.5* 9.8*  HCT 30.0* 27.1* 31.4*  MCV 107.9* 109.3* 106.4*  PLT 114* 80* 88*    Cardiac Enzymes: No results for input(s): "CKTOTAL", "CKMB", "CKMBINDEX", "TROPONINI" in the last 168 hours.  BNP: Invalid input(s): "POCBNP"  CBG: Recent Labs  Lab 12/09/23 1921  GLUCAP 79    Microbiology: Results for orders placed or performed during the hospital encounter of 09/01/22  Blood Culture (routine x 2)     Status: None   Collection Time: 09/01/22  6:41 PM   Specimen: BLOOD  Result Value Ref Range Status   Specimen Description BLOOD BLOOD LEFT ARM  Final   Special  Requests   Final    BOTTLES DRAWN AEROBIC AND ANAEROBIC Blood Culture adequate volume   Culture   Final    NO GROWTH 5 DAYS Performed at [facility], [address] [place], [place] [address]    Report Status 09/06/2022 FINAL  Final  SARS Coronavirus 2 by RT PCR (hospital order, performed in [facility] hospital lab) *cepheid single result test* Anterior Nasal Swab     Status: None   Collection Time: 09/01/22  6:42 PM   Specimen: Anterior Nasal Swab  Result Value Ref Range Status   SARS Coronavirus 2 by RT PCR  NEGATIVE NEGATIVE Final    Comment: (NOTE) SARS-CoV-2 target nucleic acids are NOT DETECTED.  The SARS-CoV-2 RNA is generally detectable in upper and lower respiratory specimens during the acute phase of infection. The lowest concentration of SARS-CoV-2 viral copies this assay can detect is 250 copies / mL. A negative result does not preclude SARS-CoV-2 infection and should not be used as the sole basis for treatment or other patient management decisions.  A negative result may occur with improper specimen collection / handling, submission of specimen other than nasopharyngeal swab, presence of viral mutation(s) within the areas targeted by this assay, and inadequate number of viral copies (<250 copies / mL). A negative result must be combined with clinical observations, patient history, and epidemiological information.  Fact Sheet for Patients:   RoadLapTop.co.za  Fact Sheet for Healthcare Providers: http://kim-miller.com/  This test is not yet approved or  cleared by the [place] FDA and has been authorized for detection and/or diagnosis of SARS-CoV-2 by FDA under an Emergency Use Authorization (EUA).  This EUA will remain in effect (meaning this test can be used) for the duration of the COVID-19 declaration under Section 564(b)(1) of the Act, 21 U.S.C. section 360bbb-3(b)(1), unless the authorization is terminated or revoked sooner.  Performed at [facility], [address] [place], [place] [address]   Culture, blood (Routine X 2) w Reflex to ID Panel     Status: None   Collection Time: 09/03/22  2:20 AM   Specimen: BLOOD  Result Value Ref Range Status   Specimen Description BLOOD BLOOD RIGHT ARM  Final   Special Requests   Final    IN BOTH AEROBIC AND ANAEROBIC BOTTLES Blood Culture adequate volume   Culture   Final    NO GROWTH 5 DAYS Performed at [facility], [address] [place], [place]  [address]    Report Status 09/08/2022 FINAL  Final    Coagulation Studies: No results for input(s): "LABPROT", "INR" in the last 72 hours.  Urinalysis: Recent Labs    12/09/23 2327  COLORURINE YELLOW*  LABSPEC 1.015  PHURINE 5.0  GLUCOSEU 150*  HGBUR NEGATIVE  BILIRUBINUR NEGATIVE  KETONESUR NEGATIVE  PROTEINUR NEGATIVE  NITRITE NEGATIVE  LEUKOCYTESUR NEGATIVE      Imaging: CT HEAD WO CONTRAST ( ) Result Date: 12/09/2023 CLINICAL DATA:  Altered mental status EXAM: CT HEAD WITHOUT CONTRAST TECHNIQUE: Contiguous axial images were obtained from the base of the skull through the vertex without intravenous contrast. RADIATION DOSE REDUCTION: This exam was performed according to the departmental dose-optimization program which includes automated exposure control, adjustment of the mA and/or kV according to patient size and/or use of iterative reconstruction technique. COMPARISON:  03/04/2023 FINDINGS: Brain: Right frontal encephalomalacia. Right posterior approach shunt catheter with tip in the anterior right lateral ventricle. No hydrocephalus. No acute hemorrhage. Vascular: No hyperdense vessel or unexpected vascular  calcification. Skull: The visualized skull base, calvarium and extracranial soft tissues are normal. Sinuses/Orbits: No fluid levels or advanced mucosal thickening of the visualized paranasal sinuses. No mastoid or middle ear effusion. Normal orbits. Other: None. IMPRESSION: 1. No acute intracranial abnormality. 2. Right frontal encephalomalacia. 3. Right posterior approach shunt catheter with tip in the anterior right lateral ventricle. No hydrocephalus. Electronically Signed   By: Deatra Robinson M.D.   On: 12/09/2023 20:40     Medications:      dapagliflozin propanediol  10 mg Oral Daily   divalproex  1,000 mg Oral BID   divalproex  500 mg Oral Q1200   donepezil  10 mg Oral QHS   fenofibrate  160 mg Oral Daily   fluticasone furoate-vilanterol  1 puff Inhalation Daily    And   umeclidinium bromide  1 puff Inhalation Daily   heparin  5,000 Units Subcutaneous Q8H   lactulose  30 g Oral TID   pantoprazole  40 mg Oral Daily   rosuvastatin  20 mg Oral QHS   acetaminophen **OR** acetaminophen, albuterol, hydrOXYzine, magnesium hydroxide, naloxone, ondansetron **OR** ondansetron (ZOFRAN) IV, traZODone  Assessment/ Plan:  Mr. LEEMON AYALA is a 68 y.o.  male  with past medical conditions including hypertension, seizure disorder, osteoarthritis, type 2 diabetes, dyslipidemia, and chronic kidney disease stage IIIb.  Patient presents to the emergency department with AMS and weakness. Has been admitted for Hyperammonemia (HCC) [E72.20] AKI (acute kidney injury) (HCC) [N17.9] Acute renal failure, unspecified acute renal failure type (HCC) [N17.9] Altered mental status, unspecified altered mental status type [R41.82]   Acute Kidney Injury on chronic kidney disease stage IIIb with baseline creatinine 1.9 and GFR of 38 on 08/11/23.  Acute kidney injury secondary to dehydration.  Agree with holding dapagliflozin, furosemide, lisinopril. Continue to avoid nephrotoxic agents.   No acute indication for dialysis as creatinine is responding well to IV hydration.   Will continue to monitor throughout hospitalization.  Lab Results  Component Value Date   CREATININE 2.79 (H) 12/11/2023   CREATININE 4.12 (H) 12/10/2023   CREATININE 4.73 (H) 12/09/2023    Intake/Output Summary (Last 24 hours) at 12/11/2023 1214 Last data filed at 12/10/2023 1802 Gross per 24 hour  Intake --  Output 500 ml  Net -500 ml   2.  Hypernatremia, likely secondary to dehydration.  Sodium 151 on admission..  Patient encouraged to increase oral intake.  3.  Hypertension with chronic kidney disease.  Home regimen includes furosemide and lisinopril.  Both currently held.  4. Anemia of chronic kidney disease Macrocytic Lab Results  Component Value Date   HGB 9.8 (L) 12/11/2023  Hemoglobin  slightly decreased.  Patient prescribed oral iron supplementation outpatient.   LOS: 2 Sparsh Callens Thedore Mins 3/13/202512:14 PM

## 2023-12-12 DIAGNOSIS — N179 Acute kidney failure, unspecified: Secondary | ICD-10-CM | POA: Diagnosis not present

## 2023-12-12 DIAGNOSIS — N189 Chronic kidney disease, unspecified: Secondary | ICD-10-CM | POA: Diagnosis not present

## 2023-12-12 LAB — CBC WITH DIFFERENTIAL/PLATELET
Abs Immature Granulocytes: 0.01 10*3/uL (ref 0.00–0.07)
Basophils Absolute: 0 10*3/uL (ref 0.0–0.1)
Basophils Relative: 1 %
Eosinophils Absolute: 0.1 10*3/uL (ref 0.0–0.5)
Eosinophils Relative: 2 %
HCT: 30.3 % — ABNORMAL LOW (ref 39.0–52.0)
Hemoglobin: 9.8 g/dL — ABNORMAL LOW (ref 13.0–17.0)
Immature Granulocytes: 0 %
Lymphocytes Relative: 41 %
Lymphs Abs: 1.1 10*3/uL (ref 0.7–4.0)
MCH: 33.4 pg (ref 26.0–34.0)
MCHC: 32.3 g/dL (ref 30.0–36.0)
MCV: 103.4 fL — ABNORMAL HIGH (ref 80.0–100.0)
Monocytes Absolute: 0.3 10*3/uL (ref 0.1–1.0)
Monocytes Relative: 9 %
Neutro Abs: 1.3 10*3/uL — ABNORMAL LOW (ref 1.7–7.7)
Neutrophils Relative %: 47 %
Platelets: 80 10*3/uL — ABNORMAL LOW (ref 150–400)
RBC: 2.93 MIL/uL — ABNORMAL LOW (ref 4.22–5.81)
RDW: 14.3 % (ref 11.5–15.5)
WBC: 2.7 10*3/uL — ABNORMAL LOW (ref 4.0–10.5)
nRBC: 0 % (ref 0.0–0.2)

## 2023-12-12 LAB — BASIC METABOLIC PANEL
Anion gap: 8 (ref 5–15)
BUN: 34 mg/dL — ABNORMAL HIGH (ref 8–23)
CO2: 23 mmol/L (ref 22–32)
Calcium: 9.1 mg/dL (ref 8.9–10.3)
Chloride: 120 mmol/L — ABNORMAL HIGH (ref 98–111)
Creatinine, Ser: 2.28 mg/dL — ABNORMAL HIGH (ref 0.61–1.24)
GFR, Estimated: 31 mL/min — ABNORMAL LOW (ref >60)
Glucose, Bld: 77 mg/dL (ref 70–99)
Potassium: 3.7 mmol/L (ref 3.5–5.1)
Sodium: 151 mmol/L — ABNORMAL HIGH (ref 135–145)

## 2023-12-12 LAB — AMMONIA: Ammonia: 86 umol/L — ABNORMAL HIGH (ref 9–35)

## 2023-12-12 MED ORDER — VALPROIC ACID 250 MG/5ML PO SOLN
625.0000 mg | Freq: Four times a day (QID) | ORAL | Status: DC
  Administered 2023-12-12 – 2023-12-17 (×20): 625 mg via ORAL
  Filled 2023-12-12 (×22): qty 15

## 2023-12-12 MED ORDER — DEXTROSE 5 % IV SOLN: INTRAVENOUS | Status: DC

## 2023-12-12 NOTE — Progress Notes (Signed)
 Progress Note   Patient: Albert Figueroa ZOX:[id number] DOB: 1955/10/01 DOA: 12/09/2023     3 DOS: the patient was seen and examined on 12/12/2023     Brief hospital course: Albert Figueroa is a 68 y.o. Caucasian male with medical history significant for type 2 diabetes mellitus, COPD, hypertension, dyslipidemia, stage III chronic kidney disease, Lewy body dementia seizure disorder and stage IV chronic kidney disease who presented to the emergency room with acute onset of altered mental status with confusion as well as generalized weakness over the last couple weeks.  ED Course: When he came to the ER, BP was 95/56 with otherwise normal vital signs.  BP later on was 111/64.  Labs revealed hypernatremia of 151 and hyperkalemia 119 with a BUN of 68 and creatinine 4.73 above previous levels, albumin 3 and direct bili 0.4.  Ammonia level came back elevated at 62.  EKG as reviewed by me : EKG showed sinus bradycardia with rate of 57 and T wave version inferiorly.  TRH was therefore consulted to admit patient for further management     Assessment and Plan:  Acute kidney injury superimposed on chronic kidney disease (HCC) - The patient be admitted to a medical telemetry bed. - Continue hydration with IV 1/2 normal saline. - Continue to avoid nephrotoxins. - I have discussed the case with nephrologist   Acute hepatic encephalopathy (HCC)-improved - Continue on p.o. lactulose. - Ammonia level 62   Acute metabolic encephalopathy Likely secondary to above - Management as above.   Hypernatremia - This is clearly secondary to hypovolemia. - Continue with IV half-normal saline.  Will follow BMPs.   Dyslipidemia - We will continue statin therapy and fenofibrate.   Essential hypertension - Continue antihypertensive therapy.   Dementia with behavioral disturbance (HCC) - We will continue Aricept and Depakote ER   Type 2 diabetes mellitus with chronic kidney disease, without long-term current use  of insulin (HCC) - The patient will be placed on supplemental coverage with NovoLog. - We will continue [place].       DVT prophylaxis: Lovenox.    Advanced Care Planning:  Code Status: full code.    Family Communication: No family at bedside at this time   Disposition Plan: Back to previous home environment   Consults called: none.    Subjective:  Patient admits to improvement in mental status Denies nausea vomiting abdominal pain  Physical Exam:   GENERAL:  68 y.o.-year-old Caucasian male patient lying in the bed with no acute distress.  Drowsiness improved EYES: Pupils equal, round, reactive to light and accommodation. No scleral icterus. Extraocular muscles intact.  HEENT: Head atraumatic, normocephalic. Oropharynx and nasopharynx clear.  NECK:  Supple, no jugular venous distention. No thyroid enlargement, no tenderness.  LUNGS: Normal breath sounds bilaterally, no wheezing, rales,rhonchi or crepitation. No use of accessory muscles of respiration.  CARDIOVASCULAR: Regular rate and rhythm, S1, S2 normal. No murmurs, rubs, or gallops.  ABDOMEN: Soft, nondistended, nontender. Bowel sounds present. No organomegaly or mass.  EXTREMITIES: No pedal edema, cyanosis, or clubbing.  NEUROLOGIC: Altered mental status improved   Data Reviewed:    Latest Ref Rng & Units 12/12/2023    4:53 AM 12/11/2023    4:07 AM 12/10/2023    5:04 AM  CBC  WBC 4.0 - 10.5 K/uL 2.7  2.2  2.7   Hemoglobin 13.0 - 17.0 g/dL 9.8  9.8  8.5   Hematocrit 39.0 - 52.0 % 30.3  31.4  27.1   Platelets 150 -  400 K/uL 80  88  80        Latest Ref Rng & Units 12/12/2023    4:53 AM 12/11/2023    4:07 AM 12/10/2023    5:04 AM  BMP  Glucose 70 - 99 mg/dL 77  72  79   BUN 8 - 23 mg/dL 34  42  60   Creatinine 0.61 - 1.24 mg/dL 4.03  4.74  2.59   Sodium 135 - 145 mmol/L 151  151  152   Potassium 3.5 - 5.1 mmol/L 3.7  3.9  3.7   Chloride 98 - 111 mmol/L 120  120  124   CO2 22 - 32 mmol/L 23  22  23    Calcium 8.9 -  10.3 mg/dL 9.1  8.8  8.4     Vitals:   12/11/23 2037 12/12/23 0349 12/12/23 0739 12/12/23 1529  BP: (!) 148/73 (!) 148/73 (!) 144/58 126/69  Pulse: (!) 57 62 65 (!) 59  Resp: 16 16 18    Temp: 98.4 F (36.9 C) 97.7 F (36.5 C) 97.8 F (36.6 C) 97.8 F (36.6 C)  TempSrc: Oral  Oral Oral  SpO2: 100% 99% 100% 97%  Weight:      Height:         Time spent: 50 minutes  Author: Loyce Dys, MD 12/12/2023 4:39 PM  For on call review [email or web address].

## 2023-12-12 NOTE — Progress Notes (Signed)
   12/12/23 0930  Spiritual Encounters  Type of Visit Initial  Conversation partners present during encounter Nurse  Reason for visit Advance directives  OnCall Visit No   Chaplain Resident spoke with Nurse regarding [place] for AD and shared info. with Unit Chaplain.    Rev. Rana M. Earlene Plater, M.Div. Chaplain Resident  [facility]

## 2023-12-12 NOTE — Progress Notes (Signed)
 [facility]  ROUNDING NOTE   Subjective:   Albert Figueroa  is a 68 year old male with past medical conditions including hypertension, seizure disorder, osteoarthritis, type 2 diabetes, dyslipidemia, and chronic kidney disease stage IIIb.  Patient presents to the emergency department with altered mental status and weakness and has been admitted for Hyperammonemia (HCC) [E72.20] AKI (acute kidney injury) (HCC) [N17.9] Acute renal failure, unspecified acute renal failure type (HCC) [N17.9] Altered mental status, unspecified altered mental status type [R41.82]  Patient is on our practice from previous admissions.  Patient is a poor historian so chart review used to obtain history.  It appears patient has been experiencing generalized weakness for 2 weeks or so.  Altered mental status was first noted yesterday.  Also states patient had a fall yesterday.  Patient denies any recent nausea, vomiting or diarrhea.  Denies pain at this time.  Given mixed responses pertaining to appetite and urination.  Patient is doing fair today.  Somewhat lethargic. Mouth appears dry.      Objective:  Vital signs in last 24 hours:  Temp:  [97.7 F (36.5 C)-98.4 F (36.9 C)] 97.8 F (36.6 C) (03/14 0739) Pulse Rate:  [55-65] 65 (03/14 0739) Resp:  [16-18] 18 (03/14 0739) BP: (130-148)/(58-73) 144/58 (03/14 0739) SpO2:  [99 %-100 %] 100 % (03/14 0739)  Weight change:  Filed Weights   12/09/23 1918  Weight: 90.7 kg    Intake/Output: I/O last 3 completed shifts: In: 360 [P.O.:360] Out: 750 [Urine:750]   Intake/Output this shift:  No intake/output data recorded.  Physical Exam: General: NAD  Head: Normocephalic, atraumatic. Dry oral mucosal membranes  Eyes: Anicteric  Lungs:  Clear to auscultation, normal effort  Heart: Regular rate and rhythm  Abdomen:  Soft, nontender,   Extremities:  Trace peripheral edema.  Neurologic: Somnolent  Skin: No lesions       Basic Metabolic  Panel: Recent Labs  Lab 12/09/23 1955 12/10/23 0504 12/11/23 0407 12/12/23 0453  NA 151* 152* 151* 151*  K 4.3 3.7 3.9 3.7  CL 119* 124* 120* 120*  CO2 22 23 22 23   GLUCOSE 84 79 72 77  BUN 68* 60* 42* 34*  CREATININE 4.73* 4.12* 2.79* 2.28*  CALCIUM 9.0 8.4* 8.8* 9.1    Liver Function Tests: Recent Labs  Lab 12/09/23 1955  AST 29  ALT 12  ALKPHOS 47  BILITOT 1.0  PROT 7.0  ALBUMIN 3.0*   No results for input(s): "LIPASE", "AMYLASE" in the last 168 hours. Recent Labs  Lab 12/10/23 0504 12/11/23 0507 12/12/23 0453  AMMONIA 126* 99* 86*    CBC: Recent Labs  Lab 12/09/23 1955 12/10/23 0504 12/11/23 0407 12/12/23 0453  WBC 3.4* 2.7* 2.2* 2.7*  NEUTROABS 1.3*  --  0.8* 1.3*  HGB 9.3* 8.5* 9.8* 9.8*  HCT 30.0* 27.1* 31.4* 30.3*  MCV 107.9* 109.3* 106.4* 103.4*  PLT 114* 80* 88* 80*    Cardiac Enzymes: No results for input(s): "CKTOTAL", "CKMB", "CKMBINDEX", "TROPONINI" in the last 168 hours.  BNP: Invalid input(s): "POCBNP"  CBG: Recent Labs  Lab 12/09/23 1921  GLUCAP 79    Microbiology: Results for orders placed or performed during the hospital encounter of 09/01/22  Blood Culture (routine x 2)     Status: None   Collection Time: 09/01/22  6:41 PM   Specimen: BLOOD  Result Value Ref Range Status   Specimen Description BLOOD BLOOD LEFT ARM  Final   Special Requests   Final    BOTTLES DRAWN  AEROBIC AND ANAEROBIC Blood Culture adequate volume   Culture   Final    NO GROWTH 5 DAYS Performed at [facility], [address] [place], [place] [address]    Report Status 09/06/2022 FINAL  Final  SARS Coronavirus 2 by RT PCR (hospital order, performed in [facility] hospital lab) *cepheid single result test* Anterior Nasal Swab     Status: None   Collection Time: 09/01/22  6:42 PM   Specimen: Anterior Nasal Swab  Result Value Ref Range Status   SARS Coronavirus 2 by RT PCR NEGATIVE NEGATIVE Final    Comment: (NOTE) SARS-CoV-2 target  nucleic acids are NOT DETECTED.  The SARS-CoV-2 RNA is generally detectable in upper and lower respiratory specimens during the acute phase of infection. The lowest concentration of SARS-CoV-2 viral copies this assay can detect is 250 copies / mL. A negative result does not preclude SARS-CoV-2 infection and should not be used as the sole basis for treatment or other patient management decisions.  A negative result may occur with improper specimen collection / handling, submission of specimen other than nasopharyngeal swab, presence of viral mutation(s) within the areas targeted by this assay, and inadequate number of viral copies (<250 copies / mL). A negative result must be combined with clinical observations, patient history, and epidemiological information.  Fact Sheet for Patients:   RoadLapTop.co.za  Fact Sheet for Healthcare Providers: http://kim-miller.com/  This test is not yet approved or  cleared by the [place] FDA and has been authorized for detection and/or diagnosis of SARS-CoV-2 by FDA under an Emergency Use Authorization (EUA).  This EUA will remain in effect (meaning this test can be used) for the duration of the COVID-19 declaration under Section 564(b)(1) of the Act, 21 U.S.C. section 360bbb-3(b)(1), unless the authorization is terminated or revoked sooner.  Performed at [facility], [address] [place], [place] [address]   Culture, blood (Routine X 2) w Reflex to ID Panel     Status: None   Collection Time: 09/03/22  2:20 AM   Specimen: BLOOD  Result Value Ref Range Status   Specimen Description BLOOD BLOOD RIGHT ARM  Final   Special Requests   Final    IN BOTH AEROBIC AND ANAEROBIC BOTTLES Blood Culture adequate volume   Culture   Final    NO GROWTH 5 DAYS Performed at [facility], [address], [place], [place] [address]    Report Status 09/08/2022 FINAL  Final     Coagulation Studies: No results for input(s): "LABPROT", "INR" in the last 72 hours.  Urinalysis: Recent Labs    12/09/23 2327  COLORURINE YELLOW*  LABSPEC 1.015  PHURINE 5.0  GLUCOSEU 150*  HGBUR NEGATIVE  BILIRUBINUR NEGATIVE  KETONESUR NEGATIVE  PROTEINUR NEGATIVE  NITRITE NEGATIVE  LEUKOCYTESUR NEGATIVE      Imaging: No results found.    Medications:    dextrose 75 mL/hr at 12/12/23 1053     donepezil  10 mg Oral QHS   fenofibrate  160 mg Oral Daily   fluticasone furoate-vilanterol  1 puff Inhalation Daily   And   umeclidinium bromide  1 puff Inhalation Daily   heparin  5,000 Units Subcutaneous Q8H   lactulose  30 g Oral TID   pantoprazole  40 mg Oral Daily   rosuvastatin  20 mg Oral QHS   valproic acid  625 mg Oral Q6H   acetaminophen **OR** acetaminophen, albuterol, hydrOXYzine, magnesium hydroxide, naloxone, ondansetron **OR** ondansetron (ZOFRAN) IV, traZODone  Assessment/ Plan:  Mr. Albert Figueroa is a 68 y.o.  male  with past medical conditions including hypertension, seizure disorder, osteoarthritis, type 2 diabetes, dyslipidemia, and chronic kidney disease stage IIIb.  Patient presents to the emergency department with AMS and weakness. Has been admitted for Hyperammonemia (HCC) [E72.20] AKI (acute kidney injury) (HCC) [N17.9] Acute renal failure, unspecified acute renal failure type (HCC) [N17.9] Altered mental status, unspecified altered mental status type [R41.82]   Acute Kidney Injury on chronic kidney disease stage IIIb with baseline creatinine 1.9 and GFR of 38 on 08/11/23.  Acute kidney injury secondary to dehydration.  Agree with holding dapagliflozin, furosemide, lisinopril. Continue to avoid nephrotoxic agents.   No acute indication for dialysis as creatinine is responding well to IV hydration.   Will continue to monitor throughout hospitalization.  Lab Results  Component Value Date   CREATININE 2.28 (H) 12/12/2023   CREATININE 2.79  (H) 12/11/2023   CREATININE 4.12 (H) 12/10/2023    Intake/Output Summary (Last 24 hours) at 12/12/2023 1302 Last data filed at 12/12/2023 0400 Gross per 24 hour  Intake 360 ml  Output 750 ml  Net -390 ml   2.  Hypernatremia, likely secondary to dehydration.  Sodium 151 on admission.  Remains elevated at 151.  Patient encouraged to increase oral water intake but does not appear to be drinking much because of somnolence.  We will order IV D5W  drip x 1 L.  3.  Hypertension with chronic kidney disease.  Home regimen includes furosemide and lisinopril.  Both currently held.  4. Anemia of chronic kidney disease Macrocytic Lab Results  Component Value Date   HGB 9.8 (L) 12/12/2023  Hemoglobin slightly decreased.  Patient prescribed oral iron supplementation outpatient.   LOS: 3 [place] 3/14/20251:02 PM

## 2023-12-12 NOTE — Progress Notes (Signed)
   12/12/23 1000  Spiritual Encounters  Type of Visit Initial  Care provided to: Albert Figueroa partners present during encounter Other (comment) ([place] Albert Figueroa)  Referral source Family (Albert Figueroa)  Reason for visit Advance directives  OnCall Visit No   Chaplain responded to spiritual consult request for AD. Contacted daughter as requested per note and explained the patients previous documented diagnosis of lewy body dementia with AMS.  This diagnosis disqualifies his capacity to fully understand AD paperwork.  Explained to daughter that due to [facility] Surrogate Law she is next of kin and would be considered first contact in decision making for her father's health care decisions.  Daughter explained that she was requesting POA. Daughter expresses understanding the Figueroa only offers HCPOA/Living Will.

## 2023-12-12 NOTE — Care Management Important Message (Signed)
 Important Message  Patient Details  Name: Albert Figueroa MRN: [id number] Date of Birth: 12-04-1955   Important Message Given:  Yes - Medicare IM     Cristela Blue, CMA 12/12/2023, 10:53 AM

## 2023-12-12 NOTE — Progress Notes (Signed)
 Pt remains confused but improving. He is able to state his name, place and the current president. He was reoriented to time and situation and verbalized understanding. Will continue to reinforce.   Pt's appetite also improving as well ate about 75% of dinner. And drinking more fluids. Will continue to monitor.

## 2023-12-12 NOTE — Progress Notes (Signed)
 PT Cancellation Note  Patient Details Name: Albert Figueroa MRN: [id number] DOB: [birth year]   Cancelled Treatment:    Reason Eval/Treat Not Completed: Fatigue/lethargy limiting ability to participate Patient wakes briefly when called name, touch leg. He continues to be very lethargic. Declined PT. Will re-attempt later if time allows.   Tinie Mcgloin 12/12/2023, 10:31 AM

## 2023-12-12 NOTE — Progress Notes (Signed)
 Physical Therapy Treatment Patient Details Name: Albert Figueroa MRN: [id number] DOB: [birth year] Today's Date: 12/12/2023   History of Present Illness Albert Figueroa is a 68 y.o. Caucasian male with medical history significant for type 2 diabetes mellitus, COPD, hypertension, dyslipidemia, stage III chronic kidney disease, Lewy body dementia seizure disorder and stage IV chronic kidney disease who presented to the emergency room 12/09/23 with acute onset of altered mental status with confusion since yesterday as well as generalized weakness over the last couple weeks.  He had a fall 12/08/23.    PT Comments  Patient received completely sideways on bed. Legs over side rail and head between other side rails. Patient is disoriented. Patient required +2 assist for bed mobility. He is confused, and lethargic. Not able to participate further with therapy at this time. We will continue to follow patient while here and see if his mentation clears.         If plan is discharge home, recommend the following: A lot of help with bathing/dressing/bathroom;Two people to help with walking and/or transfers   Can travel by private vehicle     No  Equipment Recommendations  Other (comment) (TBD)    Recommendations for Other Services       Precautions / Restrictions Precautions Precautions: Fall Recall of Precautions/Restrictions: Impaired Restrictions Weight Bearing Restrictions Per Provider Order: No     Mobility  Bed Mobility Overal bed mobility: Needs Assistance             General bed mobility comments: Patient assisted with repositioning in bed. Too confused/disoriented to attempt safe mobility beyond that at this time    Transfers                        Ambulation/Gait                   Stairs             Wheelchair Mobility     Tilt Bed    Modified Rankin (Stroke Patients Only)       Balance                                             Communication Communication Communication: Impaired Factors Affecting Communication: Reduced clarity of speech  Cognition Arousal: Lethargic Behavior During Therapy: Impulsive   PT - Cognitive impairments: No family/caregiver present to determine baseline, Difficult to assess Difficult to assess due to: Level of arousal                     PT - Cognition Comments: patient falls alseep very easily and often throught visit Following commands: Impaired Following commands impaired: Follows one step commands inconsistently    Cueing Cueing Techniques: Verbal cues, Gestural cues  Exercises      General Comments        Pertinent Vitals/Pain Pain Assessment Pain Assessment: No/denies pain    Home Living                          Prior Function            PT Goals (current goals can now be found in the care plan section) Acute Rehab PT Goals Patient Stated Goal: none stated PT Goal Formulation: Patient unable to participate in goal setting  Time For Goal Achievement: 12/24/23 Progress towards PT goals: Not progressing toward goals - comment (continues to be disoriented, lethargic)    Frequency    Min 2X/week      PT Plan      Co-evaluation              AM-PAC PT "6 Clicks" Mobility   Outcome Measure  Help needed turning from your back to your side while in a flat bed without using bedrails?: A Lot Help needed moving from lying on your back to sitting on the side of a flat bed without using bedrails?: A Lot Help needed moving to and from a bed to a chair (including a wheelchair)?: Total Help needed standing up from a chair using your arms (e.g., wheelchair or bedside chair)?: Total Help needed to walk in hospital room?: Total Help needed climbing 3-5 steps with a railing? : Total 6 Click Score: 8    End of Session   Activity Tolerance: Patient limited by lethargy;Other (comment) (confused) Patient left: in bed;with bed alarm  set;with nursing/sitter in room Nurse Communication: Mobility status PT Visit Diagnosis: Unsteadiness on feet (R26.81);Other abnormalities of gait and mobility (R26.89);Muscle weakness (generalized) (M62.81);History of falling (Z91.81);Difficulty in walking, not elsewhere classified (R26.2)     Time: 1610-9604 PT Time Calculation (min) (ACUTE ONLY): 10 min  Charges:    $Therapeutic Activity: 8-22 mins PT General Charges $$ ACUTE PT VISIT: 1 Visit                     Duffy Dantonio, PT, GCS 12/12/23,2:08 PM

## 2023-12-13 DIAGNOSIS — N189 Chronic kidney disease, unspecified: Secondary | ICD-10-CM | POA: Diagnosis not present

## 2023-12-13 DIAGNOSIS — N179 Acute kidney failure, unspecified: Secondary | ICD-10-CM | POA: Diagnosis not present

## 2023-12-13 LAB — CBC WITH DIFFERENTIAL/PLATELET
Abs Immature Granulocytes: 0.03 10*3/uL (ref 0.00–0.07)
Basophils Absolute: 0 10*3/uL (ref 0.0–0.1)
Basophils Relative: 1 %
Eosinophils Absolute: 0.1 10*3/uL (ref 0.0–0.5)
Eosinophils Relative: 2 %
HCT: 28.7 % — ABNORMAL LOW (ref 39.0–52.0)
Hemoglobin: 9.2 g/dL — ABNORMAL LOW (ref 13.0–17.0)
Immature Granulocytes: 1 %
Lymphocytes Relative: 47 %
Lymphs Abs: 1.2 10*3/uL (ref 0.7–4.0)
MCH: 33.3 pg (ref 26.0–34.0)
MCHC: 32.1 g/dL (ref 30.0–36.0)
MCV: 104 fL — ABNORMAL HIGH (ref 80.0–100.0)
Monocytes Absolute: 0.2 10*3/uL (ref 0.1–1.0)
Monocytes Relative: 8 %
Neutro Abs: 1.1 10*3/uL — ABNORMAL LOW (ref 1.7–7.7)
Neutrophils Relative %: 41 %
Platelets: 66 10*3/uL — ABNORMAL LOW (ref 150–400)
RBC: 2.76 MIL/uL — ABNORMAL LOW (ref 4.22–5.81)
RDW: 14.6 % (ref 11.5–15.5)
WBC: 2.6 10*3/uL — ABNORMAL LOW (ref 4.0–10.5)
nRBC: 0 % (ref 0.0–0.2)

## 2023-12-13 LAB — BASIC METABOLIC PANEL
Anion gap: 6 (ref 5–15)
BUN: 27 mg/dL — ABNORMAL HIGH (ref 8–23)
CO2: 25 mmol/L (ref 22–32)
Calcium: 9 mg/dL (ref 8.9–10.3)
Chloride: 119 mmol/L — ABNORMAL HIGH (ref 98–111)
Creatinine, Ser: 2.1 mg/dL — ABNORMAL HIGH (ref 0.61–1.24)
GFR, Estimated: 34 mL/min — ABNORMAL LOW (ref >60)
Glucose, Bld: 91 mg/dL (ref 70–99)
Potassium: 3.8 mmol/L (ref 3.5–5.1)
Sodium: 150 mmol/L — ABNORMAL HIGH (ref 135–145)

## 2023-12-13 LAB — AMMONIA: Ammonia: 78 umol/L — ABNORMAL HIGH (ref 9–35)

## 2023-12-13 MED ORDER — DEXTROSE 5 % IV SOLN
INTRAVENOUS | Status: AC
Start: 2023-12-13 — End: 2023-12-14

## 2023-12-13 NOTE — Progress Notes (Signed)
 [facility]  ROUNDING NOTE   Subjective:  Patient sitting up in bed, drowsy and talking but slow to respond. No acute signs of respiratory distress. Patient denies pain. Encouraged patient to eat.    Objective:  Vital signs in last 24 hours:  Temp:  [97.4 F (36.3 C)-98.2 F (36.8 C)] 98.1 F (36.7 C) (03/15 1417) Pulse Rate:  [59-65] 64 (03/15 1417) Resp:  [14-18] 14 (03/15 1417) BP: (122-149)/(57-75) 130/75 (03/15 1417) SpO2:  [97 %-100 %] 100 % (03/15 1417)  Weight change:  Filed Weights   12/09/23 1918  Weight: 90.7 kg    Intake/Output: I/O last 3 completed shifts: In: 360 [P.O.:360] Out: 1250 [Urine:1250]   Intake/Output this shift:  No intake/output data recorded.  Physical Exam: General: NAD,   Head: Normocephalic, atraumatic. Moist oral mucosal membranes  Eyes: Anicteric, PERRL  Neck: Supple, trachea midline  Lungs:  Clear to auscultation  Heart: Regular rate and rhythm  Abdomen:  Soft, nontender,   Extremities:  No peripheral edema.  Neurologic: Nonfocal, moving all four extremities  Skin: No lesions  Access: None    Basic Metabolic Panel: Recent Labs  Lab 12/09/23 1955 12/10/23 0504 12/11/23 0407 12/12/23 0453 12/13/23 0315  NA 151* 152* 151* 151* 150*  K 4.3 3.7 3.9 3.7 3.8  CL 119* 124* 120* 120* 119*  CO2 22 23 22 23 25   GLUCOSE 84 79 72 77 91  BUN 68* 60* 42* 34* 27*  CREATININE 4.73* 4.12* 2.79* 2.28* 2.10*  CALCIUM 9.0 8.4* 8.8* 9.1 9.0    Liver Function Tests: Recent Labs  Lab 12/09/23 1955  AST 29  ALT 12  ALKPHOS 47  BILITOT 1.0  PROT 7.0  ALBUMIN 3.0*   No results for input(s): "LIPASE", "AMYLASE" in the last 168 hours. Recent Labs  Lab 12/11/23 0507 12/12/23 0453 12/13/23 0315  AMMONIA 99* 86* 78*    CBC: Recent Labs  Lab 12/09/23 1955 12/10/23 0504 12/11/23 0407 12/12/23 0453 12/13/23 0315  WBC 3.4* 2.7* 2.2* 2.7* 2.6*  NEUTROABS 1.3*  --  0.8* 1.3* 1.1*  HGB 9.3* 8.5* 9.8* 9.8* 9.2*   HCT 30.0* 27.1* 31.4* 30.3* 28.7*  MCV 107.9* 109.3* 106.4* 103.4* 104.0*  PLT 114* 80* 88* 80* 66*    Cardiac Enzymes: No results for input(s): "CKTOTAL", "CKMB", "CKMBINDEX", "TROPONINI" in the last 168 hours.  BNP: Invalid input(s): "POCBNP"  CBG: Recent Labs  Lab 12/09/23 1921  GLUCAP 79    Microbiology: Results for orders placed or performed during the hospital encounter of 09/01/22  Blood Culture (routine x 2)     Status: None   Collection Time: 09/01/22  6:41 PM   Specimen: BLOOD  Result Value Ref Range Status   Specimen Description BLOOD BLOOD LEFT ARM  Final   Special Requests   Final    BOTTLES DRAWN AEROBIC AND ANAEROBIC Blood Culture adequate volume   Culture   Final    NO GROWTH 5 DAYS Performed at [facility], [address], [place], [place] [address]    Report Status 09/06/2022 FINAL  Final  SARS Coronavirus 2 by RT PCR (hospital order, performed in [facility] hospital lab) *cepheid single result test* Anterior Nasal Swab     Status: None   Collection Time: 09/01/22  6:42 PM   Specimen: Anterior Nasal Swab  Result Value Ref Range Status   SARS Coronavirus 2 by RT PCR NEGATIVE NEGATIVE Final    Comment: (NOTE) SARS-CoV-2 target nucleic acids are NOT DETECTED.  The SARS-CoV-2 RNA is generally detectable in upper and lower respiratory specimens during the acute phase of infection. The lowest concentration of SARS-CoV-2 viral copies this assay can detect is 250 copies / mL. A negative result does not preclude SARS-CoV-2 infection and should not be used as the sole basis for treatment or other patient management decisions.  A negative result may occur with improper specimen collection / handling, submission of specimen other than nasopharyngeal swab, presence of viral mutation(s) within the areas targeted by this assay, and inadequate number of viral copies (<250 copies / mL). A negative result must be combined with clinical observations,  patient history, and epidemiological information.  Fact Sheet for Patients:   RoadLapTop.co.za  Fact Sheet for Healthcare Providers: http://kim-miller.com/  This test is not yet approved or  cleared by the [place] FDA and has been authorized for detection and/or diagnosis of SARS-CoV-2 by FDA under an Emergency Use Authorization (EUA).  This EUA will remain in effect (meaning this test can be used) for the duration of the COVID-19 declaration under Section 564(b)(1) of the Act, 21 U.S.C. section 360bbb-3(b)(1), unless the authorization is terminated or revoked sooner.  Performed at [facility], [address] [place], [place] [address]   Culture, blood (Routine X 2) w Reflex to ID Panel     Status: None   Collection Time: 09/03/22  2:20 AM   Specimen: BLOOD  Result Value Ref Range Status   Specimen Description BLOOD BLOOD RIGHT ARM  Final   Special Requests   Final    IN BOTH AEROBIC AND ANAEROBIC BOTTLES Blood Culture adequate volume   Culture   Final    NO GROWTH 5 DAYS Performed at [facility], [address], [place], [place] [address]    Report Status 09/08/2022 FINAL  Final    Coagulation Studies: No results for input(s): "LABPROT", "INR" in the last 72 hours.  Urinalysis: No results for input(s): "COLORURINE", "LABSPEC", "PHURINE", "GLUCOSEU", "HGBUR", "BILIRUBINUR", "KETONESUR", "PROTEINUR", "UROBILINOGEN", "NITRITE", "LEUKOCYTESUR" in the last 72 hours.  Invalid input(s): "APPERANCEUR"    Imaging: No results found.   Medications:    dextrose      donepezil  10 mg Oral QHS   fenofibrate  160 mg Oral Daily   fluticasone furoate-vilanterol  1 puff Inhalation Daily   And   umeclidinium bromide  1 puff Inhalation Daily   heparin  5,000 Units Subcutaneous Q8H   lactulose  30 g Oral TID   pantoprazole  40 mg Oral Daily   rosuvastatin  20 mg Oral QHS   valproic acid  625 mg Oral Q6H    acetaminophen **OR** acetaminophen, albuterol, hydrOXYzine, magnesium hydroxide, naloxone, ondansetron **OR** ondansetron (ZOFRAN) IV, traZODone  Assessment/ Plan:  Albert Figueroa  is a 68 year old male with past medical conditions including hypertension, seizure disorder, osteoarthritis, type 2 diabetes, dyslipidemia, and chronic kidney disease stage IIIb.  Patient presents to the emergency department with altered mental status and weakness and has been admitted for Hyperammonemia (HCC) [E72.20] AKI (acute kidney injury) (HCC) [N17.9] Acute renal failure, unspecified acute renal failure type (HCC) [N17.9] Altered mental status, unspecified altered mental status type [R41.82]   Acute Kidney Injury on chronic kidney disease stage IIIb with baseline creatinine 1.9 and GFR of 38 on 08/11/23.  Acute kidney injury secondary to dehydration.  Agree with holding dapagliflozin, furosemide, lisinopril. Continue to avoid nephrotoxic agents.   No acute indication for dialysis as creatinine responded well to IV hydration.   Will  continue renal labs to monitor throughout hospitalization.   Lab Results  Component Value Date   CREATININE 2.10 (H) 12/13/2023   CREATININE 2.28 (H) 12/12/2023   CREATININE 2.79 (H) 12/11/2023         Intake/Output Summary (Last 24 hours) at 12/13/2023 1519 Last data filed at 12/13/2023 0400 Gross per 24 hour  Intake --  Output 500 ml  Net -500 ml      2.  Hypernatremia, likely secondary to dehydration.  Sodium 151 on admission.  Remains elevated at 150  Patient encouraged to increase oral water intake but does not appear to be drinking much because of somnolence.  Change IVF to D5W at 123ml/hr  Monitor intake and output as IVF has resumed   3.  Hypertension with chronic kidney disease.  Home regimen includes furosemide and lisinopril.  Both currently held.   4. Anemia of chronic kidney disease Macrocytic Recent Labs       Lab Results  Component Value Date     HGB 9.2 (L) 03/145/2025    Hemoglobin with slight decrease.  Patient prescribed oral iron supplementation outpatient. Will monitor while inpatient.    LOS: 4 Gerri Acre Tonny Bollman 3/15/20253:14 PM

## 2023-12-13 NOTE — Progress Notes (Signed)
 Progress Note   Patient: Albert Figueroa NWG:[id number] DOB: 1955/11/12 DOA: 12/09/2023     4 DOS: the patient was seen and examined on 12/13/2023     Brief hospital course: ACEYN KATHOL is a 68 y.o. Caucasian male with medical history significant for type 2 diabetes mellitus, COPD, hypertension, dyslipidemia, stage III chronic kidney disease, Lewy body dementia seizure disorder and stage IV chronic kidney disease who presented to the emergency room with acute onset of altered mental status with confusion as well as generalized weakness over the last couple weeks.  ED Course: When he came to the ER, BP was 95/56 with otherwise normal vital signs.  BP later on was 111/64.  Labs revealed hypernatremia of 151 and hyperkalemia 119 with a BUN of 68 and creatinine 4.73 above previous levels, albumin 3 and direct bili 0.4.  Ammonia level came back elevated at 62.  EKG as reviewed by me : EKG showed sinus bradycardia with rate of 57 and T wave version inferiorly.  TRH was therefore consulted to admit patient for further management     Assessment and Plan:  Acute kidney injury superimposed on chronic kidney disease (HCC) - The patient be admitted to a medical telemetry bed. - Continue IV fluid as recommended by nephrologist - Continue to avoid nephrotoxins. - I have discussed the case with nephrologist   Acute hepatic encephalopathy (HCC)-improved - Continue on p.o. lactulose. - Ammonia level 62   Acute metabolic encephalopathy Likely secondary to above - Management as above.   Hypernatremia - This is clearly secondary to hypovolemia. - Nephrology is changing IV fluid to D5 W Continue to monitor sodium level closely   Dyslipidemia Continue statin therapy and fenofibrate.   Essential hypertension - Continue antihypertensive therapy.   Dementia with behavioral disturbance (HCC) - We will continue Aricept and Depakote ER   Type 2 diabetes mellitus with chronic kidney disease, without  long-term current use of insulin (HCC) - The patient will be placed on supplemental coverage with NovoLog. - We will continue [place].       DVT prophylaxis: Lovenox.    Advanced Care Planning:  Code Status: full code.    Family Communication: No family at bedside at this time   Disposition Plan: Back to previous home environment   Consults called: none.    Subjective:  Patient seen and examined at bedside this morning Denies nausea vomiting abdominal pain or chest pain   Physical Exam:   GENERAL:  68 y.o.-year-old Caucasian male patient lying in the bed with no acute distress.  Drowsiness improved EYES: Pupils equal, round, reactive to light and accommodation. No scleral icterus. Extraocular muscles intact.  HEENT: Head atraumatic, normocephalic. Oropharynx and nasopharynx clear.  NECK:  Supple, no jugular venous distention. No thyroid enlargement, no tenderness.  LUNGS: Normal breath sounds bilaterally, no wheezing, rales,rhonchi or crepitation. No use of accessory muscles of respiration.  CARDIOVASCULAR: Regular rate and rhythm, S1, S2 normal. No murmurs, rubs, or gallops.  ABDOMEN: Soft, nondistended, nontender. Bowel sounds present. No organomegaly or mass.  EXTREMITIES: No pedal edema, cyanosis, or clubbing.  NEUROLOGIC: Altered mental status improved   Data Reviewed:    Latest Ref Rng & Units 12/13/2023    3:15 AM 12/12/2023    4:53 AM 12/11/2023    4:07 AM  CBC  WBC 4.0 - 10.5 K/uL 2.6  2.7  2.2   Hemoglobin 13.0 - 17.0 g/dL 9.2  9.8  9.8   Hematocrit 39.0 - 52.0 % 28.7  30.3  31.4   Platelets 150 - 400 K/uL 66  80  88        Latest Ref Rng & Units 12/13/2023    3:15 AM 12/12/2023    4:53 AM 12/11/2023    4:07 AM  BMP  Glucose 70 - 99 mg/dL 91  77  72   BUN 8 - 23 mg/dL 27  34  42   Creatinine 0.61 - 1.24 mg/dL 0.45  4.09  8.11   Sodium 135 - 145 mmol/L 150  151  151   Potassium 3.5 - 5.1 mmol/L 3.8  3.7  3.9   Chloride 98 - 111 mmol/L 119  120  120   CO2 22  - 32 mmol/L 25  23  22    Calcium 8.9 - 10.3 mg/dL 9.0  9.1  8.8      Vitals:   12/12/23 1529 12/12/23 2246 12/13/23 0344 12/13/23 0848  BP: 126/69 (!) 131/57 (!) 149/74 122/65  Pulse: (!) 59 65 (!) 59 60  Resp:  16 18 14   Temp: 97.8 F (36.6 C) 98.2 F (36.8 C) (!) 97.4 F (36.3 C) (!) 97.4 F (36.3 C)  TempSrc: Oral     SpO2: 97% 100% 100% 100%  Weight:      Height:        Time spent: 39 minutes  Author: Loyce Dys, MD 12/13/2023 2:06 PM  For on call review [email or web address].

## 2023-12-13 NOTE — Plan of Care (Signed)
   Problem: Health Behavior/Discharge Planning: Goal: Ability to manage health-related needs will improve Outcome: Progressing   Problem: Clinical Measurements: Goal: Will remain free from infection Outcome: Progressing   Problem: Nutrition: Goal: Adequate nutrition will be maintained Outcome: Progressing   Problem: Coping: Goal: Level of anxiety will decrease Outcome: Progressing

## 2023-12-14 DIAGNOSIS — N189 Chronic kidney disease, unspecified: Secondary | ICD-10-CM | POA: Diagnosis not present

## 2023-12-14 DIAGNOSIS — N179 Acute kidney failure, unspecified: Secondary | ICD-10-CM | POA: Diagnosis not present

## 2023-12-14 LAB — BASIC METABOLIC PANEL
Anion gap: 3 — ABNORMAL LOW (ref 5–15)
BUN: 22 mg/dL (ref 8–23)
CO2: 26 mmol/L (ref 22–32)
Calcium: 8.7 mg/dL — ABNORMAL LOW (ref 8.9–10.3)
Chloride: 115 mmol/L — ABNORMAL HIGH (ref 98–111)
Creatinine, Ser: 1.93 mg/dL — ABNORMAL HIGH (ref 0.61–1.24)
GFR, Estimated: 38 mL/min — ABNORMAL LOW (ref >60)
Glucose, Bld: 91 mg/dL (ref 70–99)
Potassium: 3.5 mmol/L (ref 3.5–5.1)
Sodium: 144 mmol/L (ref 135–145)

## 2023-12-14 LAB — CBC WITH DIFFERENTIAL/PLATELET
Abs Immature Granulocytes: 0.02 10*3/uL (ref 0.00–0.07)
Basophils Absolute: 0 10*3/uL (ref 0.0–0.1)
Basophils Relative: 0 %
Eosinophils Absolute: 0.1 10*3/uL (ref 0.0–0.5)
Eosinophils Relative: 2 %
HCT: 28.9 % — ABNORMAL LOW (ref 39.0–52.0)
Hemoglobin: 9.2 g/dL — ABNORMAL LOW (ref 13.0–17.0)
Immature Granulocytes: 1 %
Lymphocytes Relative: 44 %
Lymphs Abs: 1.3 10*3/uL (ref 0.7–4.0)
MCH: 32.7 pg (ref 26.0–34.0)
MCHC: 31.8 g/dL (ref 30.0–36.0)
MCV: 102.8 fL — ABNORMAL HIGH (ref 80.0–100.0)
Monocytes Absolute: 0.2 10*3/uL (ref 0.1–1.0)
Monocytes Relative: 8 %
Neutro Abs: 1.3 10*3/uL — ABNORMAL LOW (ref 1.7–7.7)
Neutrophils Relative %: 45 %
Platelets: 50 10*3/uL — ABNORMAL LOW (ref 150–400)
RBC: 2.81 MIL/uL — ABNORMAL LOW (ref 4.22–5.81)
RDW: 14.2 % (ref 11.5–15.5)
WBC: 2.9 10*3/uL — ABNORMAL LOW (ref 4.0–10.5)
nRBC: 0 % (ref 0.0–0.2)

## 2023-12-14 LAB — AMMONIA: Ammonia: 70 umol/L — ABNORMAL HIGH (ref 9–35)

## 2023-12-14 NOTE — Plan of Care (Signed)
  Problem: Health Behavior/Discharge Planning: Goal: Ability to manage health-related needs will improve Outcome: Progressing   Problem: Activity: Goal: Risk for activity intolerance will decrease Outcome: Progressing   Problem: Coping: Goal: Level of anxiety will decrease Outcome: Progressing   Problem: Elimination: Goal: Will not experience complications related to bowel motility Outcome: Progressing   Problem: Pain Managment: Goal: General experience of comfort will improve and/or be controlled Outcome: Progressing

## 2023-12-14 NOTE — Progress Notes (Signed)
 [facility]  ROUNDING NOTE   Subjective:  Patient in bed, no acute signs of distress. Patient is alert and oriented. Patient denies nausea, denies vomiting, denies diarrhea, denies shortness of breath. No complaints.   Objective:  Vital signs in last 24 hours:  Temp:  [97.8 F (36.6 C)-98.2 F (36.8 C)] 97.8 F (36.6 C) (03/16 0829) Pulse Rate:  [56-77] 56 (03/16 0829) Resp:  [16-18] 16 (03/16 0829) BP: (127-157)/(72-86) 127/75 (03/16 0829) SpO2:  [99 %-100 %] 100 % (03/16 0829)  Weight change:  Filed Weights   12/09/23 1918  Weight: 90.7 kg    Intake/Output: I/O last 3 completed shifts: In: 1021.4 [P.O.:500; I.V.:521.4] Out: 500 [Urine:500]   Intake/Output this shift:  Total I/O In: 680 [P.O.:680] Out: -   Physical Exam: General: NAD,   Head: Normocephalic, atraumatic. Moist oral mucosal membranes  Eyes: Anicteric, PERRL  Neck: Supple, trachea midline  Lungs:  Clear to auscultation  Heart: Regular rate and rhythm  Abdomen:  Soft, nontender,   Extremities:  No peripheral edema.  Neurologic: Nonfocal, moving all four extremities  Skin: No lesions  Access: None    Basic Metabolic Panel: Recent Labs  Lab 12/10/23 0504 12/11/23 0407 12/12/23 0453 12/13/23 0315 12/14/23 0620  NA 152* 151* 151* 150* 144  K 3.7 3.9 3.7 3.8 3.5  CL 124* 120* 120* 119* 115*  CO2 23 22 23 25 26   GLUCOSE 79 72 77 91 91  BUN 60* 42* 34* 27* 22  CREATININE 4.12* 2.79* 2.28* 2.10* 1.93*  CALCIUM 8.4* 8.8* 9.1 9.0 8.7*    Liver Function Tests: Recent Labs  Lab 12/09/23 1955  AST 29  ALT 12  ALKPHOS 47  BILITOT 1.0  PROT 7.0  ALBUMIN 3.0*   No results for input(s): "LIPASE", "AMYLASE" in the last 168 hours. Recent Labs  Lab 12/12/23 0453 12/13/23 0315 12/14/23 0620  AMMONIA 86* 78* 70*    CBC: Recent Labs  Lab 12/09/23 1955 12/10/23 0504 12/11/23 0407 12/12/23 0453 12/13/23 0315 12/14/23 0620  WBC 3.4* 2.7* 2.2* 2.7* 2.6* 2.9*  NEUTROABS  1.3*  --  0.8* 1.3* 1.1* 1.3*  HGB 9.3* 8.5* 9.8* 9.8* 9.2* 9.2*  HCT 30.0* 27.1* 31.4* 30.3* 28.7* 28.9*  MCV 107.9* 109.3* 106.4* 103.4* 104.0* 102.8*  PLT 114* 80* 88* 80* 66* 50*    Cardiac Enzymes: No results for input(s): "CKTOTAL", "CKMB", "CKMBINDEX", "TROPONINI" in the last 168 hours.  BNP: Invalid input(s): "POCBNP"  CBG: Recent Labs  Lab 12/09/23 1921  GLUCAP 79    Microbiology: Results for orders placed or performed during the hospital encounter of 09/01/22  Blood Culture (routine x 2)     Status: None   Collection Time: 09/01/22  6:41 PM   Specimen: BLOOD  Result Value Ref Range Status   Specimen Description BLOOD BLOOD LEFT ARM  Final   Special Requests   Final    BOTTLES DRAWN AEROBIC AND ANAEROBIC Blood Culture adequate volume   Culture   Final    NO GROWTH 5 DAYS Performed at [facility], [address] [place], [place] [address]    Report Status 09/06/2022 FINAL  Final  SARS Coronavirus 2 by RT PCR (hospital order, performed in [facility] hospital lab) *cepheid single result test* Anterior Nasal Swab     Status: None   Collection Time: 09/01/22  6:42 PM   Specimen: Anterior Nasal Swab  Result Value Ref Range Status   SARS Coronavirus 2 by RT PCR NEGATIVE NEGATIVE  Final    Comment: (NOTE) SARS-CoV-2 target nucleic acids are NOT DETECTED.  The SARS-CoV-2 RNA is generally detectable in upper and lower respiratory specimens during the acute phase of infection. The lowest concentration of SARS-CoV-2 viral copies this assay can detect is 250 copies / mL. A negative result does not preclude SARS-CoV-2 infection and should not be used as the sole basis for treatment or other patient management decisions.  A negative result may occur with improper specimen collection / handling, submission of specimen other than nasopharyngeal swab, presence of viral mutation(s) within the areas targeted by this assay, and inadequate number of viral  copies (<250 copies / mL). A negative result must be combined with clinical observations, patient history, and epidemiological information.  Fact Sheet for Patients:   RoadLapTop.co.za  Fact Sheet for Healthcare Providers: http://kim-miller.com/  This test is not yet approved or  cleared by the [place] FDA and has been authorized for detection and/or diagnosis of SARS-CoV-2 by FDA under an Emergency Use Authorization (EUA).  This EUA will remain in effect (meaning this test can be used) for the duration of the COVID-19 declaration under Section 564(b)(1) of the Act, 21 U.S.C. section 360bbb-3(b)(1), unless the authorization is terminated or revoked sooner.  Performed at [facility], [address] [place], [place] [address]   Culture, blood (Routine X 2) w Reflex to ID Panel     Status: None   Collection Time: 09/03/22  2:20 AM   Specimen: BLOOD  Result Value Ref Range Status   Specimen Description BLOOD BLOOD RIGHT ARM  Final   Special Requests   Final    IN BOTH AEROBIC AND ANAEROBIC BOTTLES Blood Culture adequate volume   Culture   Final    NO GROWTH 5 DAYS Performed at [facility], [address], [place], [place] [address]    Report Status 09/08/2022 FINAL  Final    Coagulation Studies: No results for input(s): "LABPROT", "INR" in the last 72 hours.  Urinalysis: No results for input(s): "COLORURINE", "LABSPEC", "PHURINE", "GLUCOSEU", "HGBUR", "BILIRUBINUR", "KETONESUR", "PROTEINUR", "UROBILINOGEN", "NITRITE", "LEUKOCYTESUR" in the last 72 hours.  Invalid input(s): "APPERANCEUR"    Imaging: No results found.   Medications:     donepezil  10 mg Oral QHS   fenofibrate  160 mg Oral Daily   fluticasone furoate-vilanterol  1 puff Inhalation Daily   And   umeclidinium bromide  1 puff Inhalation Daily   heparin  5,000 Units Subcutaneous Q8H   lactulose  30 g Oral TID   pantoprazole  40 mg  Oral Daily   rosuvastatin  20 mg Oral QHS   valproic acid  625 mg Oral Q6H   acetaminophen **OR** acetaminophen, albuterol, hydrOXYzine, magnesium hydroxide, naloxone, ondansetron **OR** ondansetron (ZOFRAN) IV, traZODone  Assessment/ Plan:  Albert Figueroa  is a 68 year old male with past medical conditions including hypertension, seizure disorder, osteoarthritis, type 2 diabetes, dyslipidemia, and chronic kidney disease stage IIIb.  Patient presents to the emergency department with altered mental status and weakness and has been admitted for Hyperammonemia (HCC) [E72.20] AKI (acute kidney injury) (HCC) [N17.9] Acute renal failure, unspecified acute renal failure type (HCC) [N17.9] Altered mental status, unspecified altered mental status type [R41.82]     Acute Kidney Injury on chronic kidney disease stage IIIb with baseline creatinine 1.9 and GFR of 38 on 08/11/23.  Acute kidney injury secondary to dehydration.  Agree with holding dapagliflozin, furosemide, lisinopril. Continue to avoid nephrotoxic agents.   No acute indication for  dialysis as creatinine responded well to IV hydration.   Will continue renal labs to monitor throughout hospitalization. Encourage PO intake.   Lab Results  Component Value Date   CREATININE 1.93 (H) 12/14/2023   CREATININE 2.10 (H) 12/13/2023   CREATININE 2.28 (H) 12/12/2023        2.  Hypernatremia, likely secondary to dehydration.  Sodium 151 on admission.  Remains elevated at 150  Patient encouraged to increase oral water intake but does not appear to be drinking much because of somnolence.  Improved sodium 144 IVF stopped due to improved sodium level Monitor intake and output as IVF has resumed   3.  Hypertension with chronic kidney disease.  Home regimen includes furosemide and lisinopril.  Both currently held. BP 127/75, stable    4. Anemia of chronic kidney disease Macrocytic Recent Labs           Lab Results  Component Value Date    HGB  9.2 (L) 12/14/2023    Hemoglobin with slight decrease.  Patient prescribed oral iron supplementation outpatient. Will monitor while inpatient.   LOS: 5 Cleda Imel P Windell Moulding 3/16/20253:28 PM

## 2023-12-14 NOTE — Progress Notes (Addendum)
 Progress Note   Patient: Albert Figueroa DOB: 13-Feb-1956 DOA: 12/09/2023     5 DOS: the patient was seen and examined on 12/14/2023   Brief hospital course: Albert Figueroa is a 68 y.o. Caucasian male with medical history significant for type 2 diabetes mellitus, COPD, hypertension, dyslipidemia, stage III chronic kidney disease, Lewy body dementia seizure disorder and stage IV chronic kidney disease who presented to the emergency room with acute onset of altered mental status with confusion as well as generalized weakness over the last couple weeks.  ED Course: When he came to the ER, BP was 95/56 with otherwise normal vital signs.  BP later on was 111/64.  Labs revealed hypernatremia of 151 and hyperkalemia 119 with a BUN of 68 and creatinine 4.73 above previous levels, albumin 3 and direct bili 0.4.  Ammonia level came back elevated at 62.  EKG as reviewed by me : EKG showed sinus bradycardia with rate of 57 and T wave version inferiorly.  TRH was therefore consulted to admit patient for further management     Assessment and Plan:  Acute kidney injury superimposed on chronic kidney disease (HCC) - The patient be admitted to a medical telemetry bed. - Continue IV fluid as recommended by nephrologist - Continue to avoid nephrotoxins. Plan of care discussed with nephrologist   Acute hepatic encephalopathy (HCC)-improved - Continue on p.o. lactulose. - Ammonia level 62   Acute metabolic encephalopathy-improved Likely secondary to above - Management as above.   Hypernatremia-resolved Nephrologist on board Patient received D5 W Continue to monitor electrolytes   Dyslipidemia Continue statin therapy and fenofibrate.   Essential hypertension - Continue antihypertensive therapy.   Dementia with behavioral disturbance (HCC) - Continue Aricept and Depakote ER   Type 2 diabetes mellitus with chronic kidney disease, without long-term current use of insulin (HCC) - The patient  will be placed on supplemental coverage with NovoLog. - Farxiga on hold on account of worsening renal function       DVT prophylaxis: Lovenox.    Advanced Care Planning:  Code Status: full code.    Family Communication: No family at bedside at this time   Disposition Plan: Back to previous home environment   Consults called: none.    Subjective:  Patient seen and examined at bedside this morning Denies nausea vomiting abdominal pain chest pain   Physical Exam:   GENERAL:  68 y.o.-year-old Caucasian male patient lying in the bed with no acute distress.  Drowsiness improved EYES: Pupils equal, round, reactive to light and accommodation. No scleral icterus. Extraocular muscles intact.  HEENT: Head atraumatic, normocephalic. Oropharynx and nasopharynx clear.  NECK:  Supple, no jugular venous distention. No thyroid enlargement, no tenderness.  LUNGS: Normal breath sounds bilaterally, no wheezing, rales,rhonchi or crepitation. No use of accessory muscles of respiration.  CARDIOVASCULAR: Regular rate and rhythm, S1, S2 normal. No murmurs, rubs, or gallops.  ABDOMEN: Soft, nondistended, nontender. Bowel sounds present. No organomegaly or mass.  EXTREMITIES: No pedal edema, cyanosis, or clubbing.  NEUROLOGIC: Altered mental status improved   Data Reviewed:    Vitals:   12/13/23 1417 12/13/23 2208 12/14/23 0335 12/14/23 0829  BP: 130/75 (!) 157/86 (!) 143/72 127/75  Pulse: 64 77 (!) 58 (!) 56  Resp: 14 18 18 16   Temp: 98.1 F (36.7 C) 98 F (36.7 C) 98.2 F (36.8 C) 97.8 F (36.6 C)  TempSrc:   Oral   SpO2: 100% 100% 99% 100%  Weight:  Height:          Latest Ref Rng & Units 12/14/2023    6:20 AM 12/13/2023    3:15 AM 12/12/2023    4:53 AM  CBC  WBC 4.0 - 10.5 K/uL 2.9  2.6  2.7   Hemoglobin 13.0 - 17.0 g/dL 9.2  9.2  9.8   Hematocrit 39.0 - 52.0 % 28.9  28.7  30.3   Platelets 150 - 400 K/uL 50  66  80        Latest Ref Rng & Units 12/14/2023    6:20 AM 12/13/2023     3:15 AM 12/12/2023    4:53 AM  BMP  Glucose 70 - 99 mg/dL 91  91  77   BUN 8 - 23 mg/dL 22  27  34   Creatinine 0.61 - 1.24 mg/dL 2.13  0.86  5.78   Sodium 135 - 145 mmol/L 144  150  151   Potassium 3.5 - 5.1 mmol/L 3.5  3.8  3.7   Chloride 98 - 111 mmol/L 115  119  120   CO2 22 - 32 mmol/L 26  25  23    Calcium 8.9 - 10.3 mg/dL 8.7  9.0  9.1      Disposition: Currently medically stable awaiting SNF  Time spent: 40 minutes  Author: Loyce Dys, MD 12/14/2023 1:39 PM  For on call review [email or web address].

## 2023-12-15 DIAGNOSIS — N179 Acute kidney failure, unspecified: Secondary | ICD-10-CM | POA: Diagnosis not present

## 2023-12-15 DIAGNOSIS — N189 Chronic kidney disease, unspecified: Secondary | ICD-10-CM | POA: Diagnosis not present

## 2023-12-15 LAB — CBC WITH DIFFERENTIAL/PLATELET
Abs Immature Granulocytes: 0.02 10*3/uL (ref 0.00–0.07)
Basophils Absolute: 0 10*3/uL (ref 0.0–0.1)
Basophils Relative: 1 %
Eosinophils Absolute: 0.1 10*3/uL (ref 0.0–0.5)
Eosinophils Relative: 2 %
HCT: 30.3 % — ABNORMAL LOW (ref 39.0–52.0)
Hemoglobin: 9.5 g/dL — ABNORMAL LOW (ref 13.0–17.0)
Immature Granulocytes: 1 %
Lymphocytes Relative: 42 %
Lymphs Abs: 1.1 10*3/uL (ref 0.7–4.0)
MCH: 33.3 pg (ref 26.0–34.0)
MCHC: 31.4 g/dL (ref 30.0–36.0)
MCV: 106.3 fL — ABNORMAL HIGH (ref 80.0–100.0)
Monocytes Absolute: 0.2 10*3/uL (ref 0.1–1.0)
Monocytes Relative: 8 %
Neutro Abs: 1.2 10*3/uL — ABNORMAL LOW (ref 1.7–7.7)
Neutrophils Relative %: 46 %
Platelets: 41 10*3/uL — ABNORMAL LOW (ref 150–400)
RBC: 2.85 MIL/uL — ABNORMAL LOW (ref 4.22–5.81)
RDW: 14.1 % (ref 11.5–15.5)
WBC: 2.7 10*3/uL — ABNORMAL LOW (ref 4.0–10.5)
nRBC: 0 % (ref 0.0–0.2)

## 2023-12-15 LAB — VITAMIN B12: Vitamin B-12: 522 pg/mL (ref 180–914)

## 2023-12-15 LAB — FOLATE: Folate: 7.1 ng/mL (ref >5.9)

## 2023-12-15 LAB — BASIC METABOLIC PANEL
Anion gap: 7 (ref 5–15)
BUN: 27 mg/dL — ABNORMAL HIGH (ref 8–23)
CO2: 22 mmol/L (ref 22–32)
Calcium: 9 mg/dL (ref 8.9–10.3)
Chloride: 117 mmol/L — ABNORMAL HIGH (ref 98–111)
Creatinine, Ser: 1.96 mg/dL — ABNORMAL HIGH (ref 0.61–1.24)
GFR, Estimated: 37 mL/min — ABNORMAL LOW (ref >60)
Glucose, Bld: 113 mg/dL — ABNORMAL HIGH (ref 70–99)
Potassium: 4.5 mmol/L (ref 3.5–5.1)
Sodium: 146 mmol/L — ABNORMAL HIGH (ref 135–145)

## 2023-12-15 LAB — AMMONIA: Ammonia: 79 umol/L — ABNORMAL HIGH (ref 9–35)

## 2023-12-15 NOTE — Progress Notes (Signed)
 Physical Therapy Treatment Patient Details Name: Albert Figueroa MRN: [id number] DOB: 12/20/1955 Today's Date: 12/15/2023   History of Present Illness Albert Figueroa is a 68 y.o. Caucasian male with medical history significant for type 2 diabetes mellitus, COPD, hypertension, dyslipidemia, stage III chronic kidney disease, Lewy body dementia seizure disorder and stage IV chronic kidney disease who presented to the emergency room 12/09/23 with acute onset of altered mental status with confusion since yesterday as well as generalized weakness over the last couple weeks.  He had a fall 12/08/23.    PT Comments  Pt received in Semi-Fowler's position and agreeable to therapy.  Pt being cleaned by nursing due to having a bowel movement upon arrival, but finished shortly after arrival.  Pt performed bed mobility better during session compared to prior session.  Pt able to stand with assistance, however is unable to take any steps forward.  Pt wanted to attempt on multiple occasions, so he performed STS x6 times, however unable to take any steps due to posterior bias when in standing.  Pt has poor standing posture and is unable to correct with verbal/visual/tactile cues for proper form.  Pt then assisted back to the bed and was left with all needs met and call bell within reach.      If plan is discharge home, recommend the following: A lot of help with bathing/dressing/bathroom;Two people to help with walking and/or transfers   Can travel by private vehicle        Equipment Recommendations  Other (comment) (TBD at next venue)    Recommendations for Other Services       Precautions / Restrictions Precautions Precautions: Fall Recall of Precautions/Restrictions: Impaired Restrictions Weight Bearing Restrictions Per Provider Order: No     Mobility  Bed Mobility Overal bed mobility: Needs Assistance Bed Mobility: Supine to Sit, Sit to Supine     Supine to sit: Contact guard, HOB elevated Sit to  supine: Contact guard assist        Transfers Overall transfer level: Needs assistance Equipment used: Rolling walker (2 wheels) Transfers: Sit to/from Stand Sit to Stand: Mod assist           General transfer comment: cues for upright posture, buttocks leaning posteriorly.    Ambulation/Gait               General Gait Details: pt unable to take steps forward due to posterior lean.   Stairs             Wheelchair Mobility     Tilt Bed    Modified Rankin (Stroke Patients Only)       Balance Overall balance assessment: Needs assistance Sitting-balance support: Feet supported Sitting balance-Leahy Scale: Fair Sitting balance - Comments: forward head/leaning in sitting.   Standing balance support: No upper extremity supported, During functional activity Standing balance-Leahy Scale: Poor Standing balance comment: posterior lean in standing                            Communication Communication Communication: Impaired Factors Affecting Communication: Reduced clarity of speech  Cognition Arousal: Lethargic Behavior During Therapy: Impulsive   PT - Cognitive impairments: No family/caregiver present to determine baseline, Difficult to assess Difficult to assess due to: Level of arousal                     PT - Cognition Comments: pt confused throughout the session, talking about ex girlfriends  and prison love. Following commands: Impaired Following commands impaired: Follows one step commands inconsistently    Cueing Cueing Techniques: Verbal cues, Gestural cues  Exercises      General Comments        Pertinent Vitals/Pain Pain Assessment Pain Assessment: No/denies pain    Home Living                          Prior Function            PT Goals (current goals can now be found in the care plan section) Acute Rehab PT Goals Patient Stated Goal: none stated PT Goal Formulation: Patient unable to  participate in goal setting Time For Goal Achievement: 12/24/23 Progress towards PT goals: Progressing toward goals (pt able to stand, but still disoriented and unsafe with mobility.)    Frequency    Min 2X/week      PT Plan      Co-evaluation              AM-PAC PT "6 Clicks" Mobility   Outcome Measure  Help needed turning from your back to your side while in a flat bed without using bedrails?: A Lot Help needed moving from lying on your back to sitting on the side of a flat bed without using bedrails?: A Lot Help needed moving to and from a bed to a chair (including a wheelchair)?: Total Help needed standing up from a chair using your arms (e.g., wheelchair or bedside chair)?: Total Help needed to walk in hospital room?: Total Help needed climbing 3-5 steps with a railing? : Total 6 Click Score: 8    End of Session Equipment Utilized During Treatment: Gait belt Activity Tolerance: Patient limited by fatigue Patient left: in bed;with bed alarm set;with nursing/sitter in room Nurse Communication: Mobility status PT Visit Diagnosis: Unsteadiness on feet (R26.81);Other abnormalities of gait and mobility (R26.89);Muscle weakness (generalized) (M62.81);History of falling (Z91.81);Difficulty in walking, not elsewhere classified (R26.2)     Time: 8295-6213 PT Time Calculation (min) (ACUTE ONLY): 15 min  Charges:    $Therapeutic Activity: 8-22 mins PT General Charges $$ ACUTE PT VISIT: 1 Visit                     Nolon Bussing, PT, DPT Physical Therapist - [facility]  12/15/23, 6:44 PM

## 2023-12-15 NOTE — Progress Notes (Signed)
 [facility]  ROUNDING NOTE   Subjective:  Sodium now down to 146. Patient resting comfortably in bed.   Objective:  Vital signs in last 24 hours:  Temp:  [97.8 F (36.6 C)-98.2 F (36.8 C)] 97.8 F (36.6 C) (03/17 0742) Pulse Rate:  [62-64] 62 (03/17 0742) Resp:  [17-18] 17 (03/17 0742) BP: (116-131)/(69-79) 116/71 (03/17 0742) SpO2:  [96 %-100 %] 96 % (03/17 0742)  Weight change:  Filed Weights   12/09/23 1918  Weight: 90.7 kg    Intake/Output: I/O last 3 completed shifts: In: 1701.4 [P.O.:1180; I.V.:521.4] Out: -    Intake/Output this shift:  Total I/O In: -  Out: 500 [Urine:500]  Physical Exam: General: NAD  Head: Normocephalic, atraumatic. Moist oral mucosal membranes  Eyes: Anicteric  Neck: Supple, trachea midline  Lungs:  Clear to auscultation  Heart: Regular rate and rhythm  Abdomen:  Soft, nontender,   Extremities: No peripheral edema.  Neurologic: Nonfocal, moving all four extremities  Skin: No lesions  Access: None    Basic Metabolic Panel: Recent Labs  Lab 12/11/23 0407 12/12/23 0453 12/13/23 0315 12/14/23 0620 12/15/23 0247  NA 151* 151* 150* 144 146*  K 3.9 3.7 3.8 3.5 4.5  CL 120* 120* 119* 115* 117*  CO2 22 23 25 26 22   GLUCOSE 72 77 91 91 113*  BUN 42* 34* 27* 22 27*  CREATININE 2.79* 2.28* 2.10* 1.93* 1.96*  CALCIUM 8.8* 9.1 9.0 8.7* 9.0    Liver Function Tests: Recent Labs  Lab 12/09/23 1955  AST 29  ALT 12  ALKPHOS 47  BILITOT 1.0  PROT 7.0  ALBUMIN 3.0*   No results for input(s): "LIPASE", "AMYLASE" in the last 168 hours. Recent Labs  Lab 12/13/23 0315 12/14/23 0620 12/15/23 0247  AMMONIA 78* 70* 79*    CBC: Recent Labs  Lab 12/11/23 0407 12/12/23 0453 12/13/23 0315 12/14/23 0620 12/15/23 0247  WBC 2.2* 2.7* 2.6* 2.9* 2.7*  NEUTROABS 0.8* 1.3* 1.1* 1.3* 1.2*  HGB 9.8* 9.8* 9.2* 9.2* 9.5*  HCT 31.4* 30.3* 28.7* 28.9* 30.3*  MCV 106.4* 103.4* 104.0* 102.8* 106.3*  PLT 88* 80* 66* 50*  41*    Cardiac Enzymes: No results for input(s): "CKTOTAL", "CKMB", "CKMBINDEX", "TROPONINI" in the last 168 hours.  BNP: Invalid input(s): "POCBNP"  CBG: Recent Labs  Lab 12/09/23 1921  GLUCAP 79    Microbiology: Results for orders placed or performed during the hospital encounter of 09/01/22  Blood Culture (routine x 2)     Status: None   Collection Time: 09/01/22  6:41 PM   Specimen: BLOOD  Result Value Ref Range Status   Specimen Description BLOOD BLOOD LEFT ARM  Final   Special Requests   Final    BOTTLES DRAWN AEROBIC AND ANAEROBIC Blood Culture adequate volume   Culture   Final    NO GROWTH 5 DAYS Performed at [facility], [address], [place], [place] [address]    Report Status 09/06/2022 FINAL  Final  SARS Coronavirus 2 by RT PCR (hospital order, performed in [facility] hospital lab) *cepheid single result test* Anterior Nasal Swab     Status: None   Collection Time: 09/01/22  6:42 PM   Specimen: Anterior Nasal Swab  Result Value Ref Range Status   SARS Coronavirus 2 by RT PCR NEGATIVE NEGATIVE Final    Comment: (NOTE) SARS-CoV-2 target nucleic acids are NOT DETECTED.  The SARS-CoV-2 RNA is generally detectable in upper and lower respiratory specimens during the acute  phase of infection. The lowest concentration of SARS-CoV-2 viral copies this assay can detect is 250 copies / mL. A negative result does not preclude SARS-CoV-2 infection and should not be used as the sole basis for treatment or other patient management decisions.  A negative result may occur with improper specimen collection / handling, submission of specimen other than nasopharyngeal swab, presence of viral mutation(s) within the areas targeted by this assay, and inadequate number of viral copies (<250 copies / mL). A negative result must be combined with clinical observations, patient history, and epidemiological information.  Fact Sheet for Patients:    RoadLapTop.co.za  Fact Sheet for Healthcare Providers: http://kim-miller.com/  This test is not yet approved or  cleared by the [place] FDA and has been authorized for detection and/or diagnosis of SARS-CoV-2 by FDA under an Emergency Use Authorization (EUA).  This EUA will remain in effect (meaning this test can be used) for the duration of the COVID-19 declaration under Section 564(b)(1) of the Act, 21 U.S.C. section 360bbb-3(b)(1), unless the authorization is terminated or revoked sooner.  Performed at [facility], [address] [place], [place] [address]   Culture, blood (Routine X 2) w Reflex to ID Panel     Status: None   Collection Time: 09/03/22  2:20 AM   Specimen: BLOOD  Result Value Ref Range Status   Specimen Description BLOOD BLOOD RIGHT ARM  Final   Special Requests   Final    IN BOTH AEROBIC AND ANAEROBIC BOTTLES Blood Culture adequate volume   Culture   Final    NO GROWTH 5 DAYS Performed at [facility], [address] [place], [place] [address]    Report Status 09/08/2022 FINAL  Final    Coagulation Studies: No results for input(s): "LABPROT", "INR" in the last 72 hours.  Urinalysis: No results for input(s): "COLORURINE", "LABSPEC", "PHURINE", "GLUCOSEU", "HGBUR", "BILIRUBINUR", "KETONESUR", "PROTEINUR", "UROBILINOGEN", "NITRITE", "LEUKOCYTESUR" in the last 72 hours.  Invalid input(s): "APPERANCEUR"    Imaging: No results found.   Medications:     donepezil  10 mg Oral QHS   fenofibrate  160 mg Oral Daily   fluticasone furoate-vilanterol  1 puff Inhalation Daily   And   umeclidinium bromide  1 puff Inhalation Daily   lactulose  30 g Oral TID   pantoprazole  40 mg Oral Daily   rosuvastatin  20 mg Oral QHS   valproic acid  625 mg Oral Q6H   acetaminophen **OR** acetaminophen, albuterol, hydrOXYzine, magnesium hydroxide, naloxone, ondansetron **OR** ondansetron (ZOFRAN)  IV, traZODone  Assessment/ Plan:  Albert Figueroa  is a 68 year old male with past medical conditions including hypertension, seizure disorder, osteoarthritis, type 2 diabetes, dyslipidemia, and chronic kidney disease stage IIIb.  Patient presents to the emergency department with altered mental status and weakness and has been admitted for Hyperammonemia (HCC) [E72.20] AKI (acute kidney injury) (HCC) [N17.9] Acute renal failure, unspecified acute renal failure type (HCC) [N17.9] Altered mental status, unspecified altered mental status type [R41.82]     Acute Kidney Injury on chronic kidney disease stage IIIb with baseline creatinine 1.9 and GFR of 38 on 08/11/23.  Acute kidney injury secondary to dehydration.  Agree with holding dapagliflozin, furosemide, lisinopril. Update:  Renal function has stabilized and creatinine has reached baseline at 1.9.  Continue to monitor renal parameters periodically.   Lab Results  Component Value Date   CREATININE 1.96 (H) 12/15/2023   CREATININE 1.93 (H) 12/14/2023   CREATININE 2.10 (H) 12/13/2023  2.  Hypernatremia, likely secondary to dehydration.  Sodium 151 on admission.  Sodium down to 146.  We encouraged the patient to maintain adequate hydration status   3.  Hypertension with chronic kidney disease.  Home regimen includes furosemide and lisinopril.  Both medications held at the moment.   4. Anemia of chronic kidney disease Macrocytic Recent Labs           Lab Results  Component Value Date    HGB 9.2 (L) 12/14/2023    Continue to monitor hemoglobin periodically.  No urgent need for Epogen at the moment..   LOS: 6 Jenise Iannelli 3/17/20255:07 PM

## 2023-12-15 NOTE — Plan of Care (Signed)

## 2023-12-15 NOTE — Progress Notes (Signed)
 Progress Note   Patient: Albert Figueroa ZOX:[id number] DOB: 08/07/1956 DOA: 12/09/2023     6 DOS: the patient was seen and examined on 12/15/2023      Brief hospital course: NADAV SWINDELL is a 68 y.o. Caucasian male with medical history significant for type 2 diabetes mellitus, COPD, hypertension, dyslipidemia, stage III chronic kidney disease, Lewy body dementia seizure disorder and stage IV chronic kidney disease who presented to the emergency room with acute onset of altered mental status with confusion as well as generalized weakness over the last couple weeks.  ED Course: When he came to the ER, BP was 95/56 with otherwise normal vital signs.  BP later on was 111/64.  Labs revealed hypernatremia of 151 and hyperkalemia 119 with a BUN of 68 and creatinine 4.73 above previous levels, albumin 3 and direct bili 0.4.  Ammonia level came back elevated at 62.  EKG as reviewed by me : EKG showed sinus bradycardia with rate of 57 and T wave version inferiorly.  TRH was therefore consulted to admit patient for further management     Assessment and Plan:  Acute kidney injury superimposed on chronic kidney disease (HCC) - The patient be admitted to a medical telemetry bed. - Renal function improved with IV fluids Continue oral hydration - Continue to avoid nephrotoxins. Plan of care discussed with nephrologist   Acute hepatic encephalopathy (HCC)-improved - Continue on p.o. lactulose. - Ammonia level peaked at 126  Pancytopenia likely secondary to above Thrombocytopenia Subcutaneous heparin on hold Follow-up on vitamin B12, folic acid levels 4 T-score low probability  Acute metabolic encephalopathy-improved Likely secondary to above - Management as above.   Hypernatremia-resolved Nephrologist on board Patient received D5 W Continue to monitor electrolytes   Dyslipidemia Continue statin therapy and fenofibrate.   Essential hypertension - Continue antihypertensive therapy.    Dementia with behavioral disturbance (HCC) - Continue Aricept and Depakote ER   Type 2 diabetes mellitus with chronic kidney disease, without long-term current use of insulin (HCC) - The patient will be placed on supplemental coverage with NovoLog. - Farxiga on hold on account of worsening renal function       DVT prophylaxis: Lovenox.    Advanced Care Planning:  Code Status: full code.    Family Communication: No family at bedside at this time   Disposition Plan: Back to previous home environment   Consults called: none.    Subjective:  Patient seen and examined at bedside this morning Denies nausea vomiting abdominal pain or chest pain TOC manager working on placement   Physical Exam:   GENERAL:  68 y.o.-year-old Caucasian male patient lying in the bed with no acute distress.  Drowsiness improved EYES: Pupils equal, round, reactive to light and accommodation. No scleral icterus. Extraocular muscles intact.  HEENT: Head atraumatic, normocephalic. Oropharynx and nasopharynx clear.  NECK:  Supple, no jugular venous distention. No thyroid enlargement, no tenderness.  LUNGS: Normal breath sounds bilaterally, no wheezing, rales,rhonchi or crepitation. No use of accessory muscles of respiration.  CARDIOVASCULAR: Regular rate and rhythm, S1, S2 normal. No murmurs, rubs, or gallops.  ABDOMEN: Soft, nondistended, nontender. Bowel sounds present. No organomegaly or mass.  EXTREMITIES: No pedal edema, cyanosis, or clubbing.  NEUROLOGIC: Altered mental status improved   Data Reviewed:        Latest Ref Rng & Units 12/15/2023    2:47 AM 12/14/2023    6:20 AM 12/13/2023    3:15 AM  BMP  Glucose 70 - 99 mg/dL 811  91  91   BUN 8 - 23 mg/dL 27  22  27    Creatinine 0.61 - 1.24 mg/dL 0.10  2.72  5.36   Sodium 135 - 145 mmol/L 146  144  150   Potassium 3.5 - 5.1 mmol/L 4.5  3.5  3.8   Chloride 98 - 111 mmol/L 117  115  119   CO2 22 - 32 mmol/L 22  26  25    Calcium 8.9 - 10.3 mg/dL  9.0  8.7  9.0        Latest Ref Rng & Units 12/15/2023    2:47 AM 12/14/2023    6:20 AM 12/13/2023    3:15 AM  BMP  Glucose 70 - 99 mg/dL 644  91  91   BUN 8 - 23 mg/dL 27  22  27    Creatinine 0.61 - 1.24 mg/dL 0.34  7.42  5.95   Sodium 135 - 145 mmol/L 146  144  150   Potassium 3.5 - 5.1 mmol/L 4.5  3.5  3.8   Chloride 98 - 111 mmol/L 117  115  119   CO2 22 - 32 mmol/L 22  26  25    Calcium 8.9 - 10.3 mg/dL 9.0  8.7  9.0        Latest Ref Rng & Units 12/15/2023    2:47 AM 12/14/2023    6:20 AM 12/13/2023    3:15 AM  BMP  Glucose 70 - 99 mg/dL 638  91  91   BUN 8 - 23 mg/dL 27  22  27    Creatinine 0.61 - 1.24 mg/dL 7.56  4.33  2.95   Sodium 135 - 145 mmol/L 146  144  150   Potassium 3.5 - 5.1 mmol/L 4.5  3.5  3.8   Chloride 98 - 111 mmol/L 117  115  119   CO2 22 - 32 mmol/L 22  26  25    Calcium 8.9 - 10.3 mg/dL 9.0  8.7  9.0          Latest Ref Rng & Units 12/15/2023    2:47 AM 12/14/2023    6:20 AM 12/13/2023    3:15 AM  CBC  WBC 4.0 - 10.5 K/uL 2.7  2.9  2.6   Hemoglobin 13.0 - 17.0 g/dL 9.5  9.2  9.2   Hematocrit 39.0 - 52.0 % 30.3  28.9  28.7   Platelets 150 - 400 K/uL 41  50  66       Vitals:   12/14/23 1659 12/14/23 2042 12/15/23 0310 12/15/23 0742  BP: 121/65 131/79 125/69 116/71  Pulse: 60 64 64 62  Resp: 16 18 18 17   Temp: 98 F (36.7 C) 98 F (36.7 C) 98.2 F (36.8 C) 97.8 F (36.6 C)  TempSrc: Oral Oral Oral   SpO2: 100% 100% 98% 96%  Weight:      Height:         Author: Loyce Dys, MD 12/15/2023 12:55 PM  For on call review [email or web address].

## 2023-12-15 NOTE — Plan of Care (Signed)
  Problem: Education: Goal: Knowledge of General Education information will improve Description: Including pain rating scale, medication(s)/side effects and non-pharmacologic comfort measures 12/15/2023 0415 by Evelena Asa, RN Outcome: Progressing 12/15/2023 0410 by Evelena Asa, RN Outcome: Progressing   Problem: Health Behavior/Discharge Planning: Goal: Ability to manage health-related needs will improve 12/15/2023 0415 by Evelena Asa, RN Outcome: Progressing 12/15/2023 0410 by Evelena Asa, RN Outcome: Progressing   Problem: Clinical Measurements: Goal: Ability to maintain clinical measurements within normal limits will improve 12/15/2023 0415 by Evelena Asa, RN Outcome: Progressing 12/15/2023 0410 by Evelena Asa, RN Outcome: Progressing Goal: Will remain free from infection 12/15/2023 0415 by Evelena Asa, RN Outcome: Progressing 12/15/2023 0410 by Evelena Asa, RN Outcome: Progressing Goal: Diagnostic test results will improve 12/15/2023 0415 by Evelena Asa, RN Outcome: Progressing 12/15/2023 0410 by Evelena Asa, RN Outcome: Progressing Goal: Respiratory complications will improve 12/15/2023 0415 by Evelena Asa, RN Outcome: Progressing 12/15/2023 0410 by Evelena Asa, RN Outcome: Progressing Goal: Cardiovascular complication will be avoided 12/15/2023 0415 by Evelena Asa, RN Outcome: Progressing 12/15/2023 0410 by Evelena Asa, RN Outcome: Progressing   Problem: Activity: Goal: Risk for activity intolerance will decrease 12/15/2023 0415 by Evelena Asa, RN Outcome: Progressing 12/15/2023 0410 by Evelena Asa, RN Outcome: Progressing   Problem: Nutrition: Goal: Adequate nutrition will be maintained 12/15/2023 0415 by Evelena Asa, RN Outcome: Progressing 12/15/2023 0410 by Evelena Asa, RN Outcome: Progressing   Problem: Pain Managment: Goal: General experience of comfort will improve and/or be controlled 12/15/2023 0415 by Evelena Asa, RN Outcome: Progressing 12/15/2023 0410 by Evelena Asa, RN Outcome: Progressing   Problem: Pain Managment: Goal: General experience of comfort will improve and/or be controlled 12/15/2023 0415 by Evelena Asa, RN Outcome: Progressing 12/15/2023 0410 by Evelena Asa, RN Outcome: Progressing   Problem: Safety: Goal: Ability to remain free from injury will improve 12/15/2023 0415 by Evelena Asa, RN Outcome: Progressing 12/15/2023 0410 by Evelena Asa, RN Outcome: Progressing   Problem: Skin Integrity: Goal: Risk for impaired skin integrity will decrease 12/15/2023 0415 by Evelena Asa, RN Outcome: Progressing 12/15/2023 0410 by Evelena Asa, RN Outcome: Progressing

## 2023-12-16 DIAGNOSIS — N189 Chronic kidney disease, unspecified: Secondary | ICD-10-CM | POA: Diagnosis not present

## 2023-12-16 DIAGNOSIS — N179 Acute kidney failure, unspecified: Secondary | ICD-10-CM | POA: Diagnosis not present

## 2023-12-16 LAB — CBC WITH DIFFERENTIAL/PLATELET
Abs Immature Granulocytes: 0.05 10*3/uL (ref 0.00–0.07)
Basophils Absolute: 0 10*3/uL (ref 0.0–0.1)
Basophils Relative: 1 %
Eosinophils Absolute: 0 10*3/uL (ref 0.0–0.5)
Eosinophils Relative: 1 %
HCT: 28.2 % — ABNORMAL LOW (ref 39.0–52.0)
Hemoglobin: 9.1 g/dL — ABNORMAL LOW (ref 13.0–17.0)
Immature Granulocytes: 1 %
Lymphocytes Relative: 31 %
Lymphs Abs: 1.2 10*3/uL (ref 0.7–4.0)
MCH: 33.3 pg (ref 26.0–34.0)
MCHC: 32.3 g/dL (ref 30.0–36.0)
MCV: 103.3 fL — ABNORMAL HIGH (ref 80.0–100.0)
Monocytes Absolute: 0.4 10*3/uL (ref 0.1–1.0)
Monocytes Relative: 10 %
Neutro Abs: 2.1 10*3/uL (ref 1.7–7.7)
Neutrophils Relative %: 56 %
Platelets: 45 10*3/uL — ABNORMAL LOW (ref 150–400)
RBC: 2.73 MIL/uL — ABNORMAL LOW (ref 4.22–5.81)
RDW: 14 % (ref 11.5–15.5)
WBC: 3.8 10*3/uL — ABNORMAL LOW (ref 4.0–10.5)
nRBC: 0 % (ref 0.0–0.2)

## 2023-12-16 LAB — BASIC METABOLIC PANEL
Anion gap: 7 (ref 5–15)
BUN: 26 mg/dL — ABNORMAL HIGH (ref 8–23)
CO2: 23 mmol/L (ref 22–32)
Calcium: 9.2 mg/dL (ref 8.9–10.3)
Chloride: 112 mmol/L — ABNORMAL HIGH (ref 98–111)
Creatinine, Ser: 1.89 mg/dL — ABNORMAL HIGH (ref 0.61–1.24)
GFR, Estimated: 39 mL/min — ABNORMAL LOW (ref >60)
Glucose, Bld: 110 mg/dL — ABNORMAL HIGH (ref 70–99)
Potassium: 3.7 mmol/L (ref 3.5–5.1)
Sodium: 142 mmol/L (ref 135–145)

## 2023-12-16 LAB — AMMONIA: Ammonia: 56 umol/L — ABNORMAL HIGH (ref 9–35)

## 2023-12-16 LAB — VALPROIC ACID LEVEL: Valproic Acid Lvl: 125 ug/mL — ABNORMAL HIGH (ref 50.0–100.0)

## 2023-12-16 NOTE — Plan of Care (Signed)

## 2023-12-16 NOTE — Progress Notes (Signed)
 PT Cancellation Note  Patient Details Name: Albert Figueroa MRN: [id number] DOB: 1956/05/28   Cancelled Treatment:     Several attempts to see pt for skilled PT session this am. Pt remains very lethargic, awakes to voice however can not maintain alertness. Will re-attempt next available date/time per POC.   Jannet Askew 12/16/2023, 11:52 AM

## 2023-12-16 NOTE — TOC Initial Note (Signed)
 Transition of Care [facility]) - Initial/Assessment Note    Patient Details  Name: Albert Figueroa MRN: [id number] Date of Birth: 27-Aug-1956  [facility] CM/SW Contact:    Marlowe Sax, RN Phone Number: 12/16/2023, 12:15 PM  Clinical Narrative:                  Met with the patient and he is agreeable to go to STR for a short amount of time, BED search done  Expected Discharge Plan: Skilled Nursing Facility Barriers to Discharge: SNF Pending bed offer, Insurance Authorization   Patient Goals and CMS Choice            Expected Discharge Plan and Services   Discharge Planning Services: CM Consult   Living arrangements for the past 2 months: Single Family Home                   DME Agency: NA       HH Arranged: NA          Prior Living Arrangements/Services Living arrangements for the past 2 months: Single Family Home   Patient language and need for interpreter reviewed:: Yes Do you feel safe going back to the place where you live?: Yes      Need for Family Participation in Patient Care: Yes (Comment) Care giver support system in place?: No (comment) Current home services: DME Criminal Activity/Legal Involvement Pertinent to Current Situation/Hospitalization: No - Comment as needed  Activities of Daily Living      Permission Sought/Granted   Permission granted to share information with : Yes, Verbal Permission Granted              Emotional Assessment Appearance:: Appears stated age Attitude/Demeanor/Rapport: Engaged Affect (typically observed): Pleasant Orientation: : Oriented to Self, Oriented to Place, Oriented to  Time, Oriented to Situation Alcohol / Substance Use: Not Applicable Psych Involvement: No (comment)  Admission diagnosis:  Hyperammonemia (HCC) [E72.20] AKI (acute kidney injury) (HCC) [N17.9] Acute renal failure, unspecified acute renal failure type (HCC) [N17.9] Altered mental status, unspecified altered mental status  type [R41.82] Patient Active Problem List   Diagnosis Date Noted   Acute kidney injury superimposed on chronic kidney disease (HCC) 12/10/2023   Acute hepatic encephalopathy (HCC) 12/10/2023   Hypernatremia 12/10/2023   Type 2 diabetes mellitus with chronic kidney disease, without long-term current use of insulin (HCC) 12/10/2023   Dementia with behavioral disturbance (HCC) 12/10/2023   Dehydration 03/05/2023   Acute renal failure superimposed on stage 4 chronic kidney disease (HCC) 03/04/2023   Seizure (HCC) 03/04/2023   Other pancytopenia (HCC) 09/06/2022   Hypoglycemia 09/03/2022   Acute metabolic encephalopathy 09/02/2022   Blepharitis of right lower eyelid 09/02/2022   Dyslipidemia 09/02/2022   Pulmonary hypertension (HCC) 09/02/2022   Depression with anxiety 09/02/2022   GERD without esophagitis 09/02/2022   Metabolic acidosis 09/02/2022   Intractable pain 07/05/2022   Type II diabetes mellitus with renal manifestations (HCC) 07/05/2022   Over weight 07/05/2022   Thrombocytopenia (HCC) 07/05/2022   Fall at home, initial encounter 07/05/2022   Cellulitis of right hand 07/05/2022   COPD (chronic obstructive pulmonary disease) (HCC) 07/05/2022   Iron deficiency anemia 07/05/2022   Toxic encephalopathy 07/05/2022   Neuropathy 07/05/2022   Right wrist pain    Chronic pain syndrome 01/01/2021   Pharmacologic therapy 01/01/2021   Disorder of skeletal system 01/01/2021   Problems influencing health status 01/01/2021   Aortic atherosclerosis (HCC) 02/24/2020   Other emphysema (  HCC) 02/24/2020   Medicare annual wellness visit, initial 02/03/2020   Neck pain 09/15/2018   Sacroiliac joint pain 09/08/2018   Lumbar spondylosis 06/11/2018   Left hip pain 06/11/2018   Chronic pain of both knees 06/11/2018   CKD (chronic kidney disease) stage 4, GFR 15-29 ml/min (HCC) 10/29/2017   Obesity (BMI 30-39.9) 11/27/2015   Tobacco use 06/13/2015   Essential hypertension 06/13/2015   Mixed  hyperlipidemia 06/13/2015   Osteoarthritis 06/13/2015   Biceps tendinitis 04/06/2015   Epilepsy without status epilepticus, not intractable (HCC) 12/10/2013   PCP:  Marisue Ivan, MD Pharmacy:   [facility] [phone] - [place], [place] - [address] AT [facility] OF [address] [place] [address] [place] [address] Phone: [phone] Fax: [phone]     Social Drivers of Health (SDOH) Social History: SDOH Screenings   Food Insecurity: Patient Unable To Answer (12/10/2023)  Recent Concern: Food Insecurity - Food Insecurity Present (10/06/2023)   Received from [facility]  Housing: Patient Unable To Answer (12/10/2023)  Transportation Needs: Patient Unable To Answer (12/10/2023)  Utilities: Patient Unable To Answer (12/10/2023)  Financial Resource Strain: High Risk (10/06/2023)   Received from [facility]  Social Connections: Unknown (12/10/2023)  Tobacco Use: High Risk (12/09/2023)   SDOH Interventions:     Readmission Risk Interventions    03/06/2023    3:23 PM  Readmission Risk Prevention Plan  Transportation Screening Complete  PCP or Specialist Appt within 3-5 Days Complete  HRI or Home Care Consult --  Social Work Consult for Recovery Care Planning/Counseling Complete  Palliative Care Screening Not Applicable  Medication Review Oceanographer) Complete

## 2023-12-16 NOTE — [place] FL2 (Signed)
 [place] MEDICAID FL2 LEVEL OF CARE FORM     IDENTIFICATION  Patient Name: Albert Figueroa Birthdate: [birth year] Sex: male Admission Date (Current Location): 12/09/2023  [place] and [place] Number:  Chiropodist and Address:  [facility], [address] [place], [place] [address]      Provider Number: [id number]  Attending Physician Name and Address:  Loyce Dys, MD  Relative Name and Phone Number:  Marygrace Drought  Daughter  Emergency Contact  [phone]    Current Level of Care: Hospital Recommended Level of Care: Skilled Nursing Facility Prior Approval Number:    Date Approved/Denied:   PASRR Number: [id number]  Discharge Plan: SNF    Current Diagnoses: Patient Active Problem List   Diagnosis Date Noted   Acute kidney injury superimposed on chronic kidney disease (HCC) 12/10/2023   Acute hepatic encephalopathy (HCC) 12/10/2023   Hypernatremia 12/10/2023   Type 2 diabetes mellitus with chronic kidney disease, without long-term current use of insulin (HCC) 12/10/2023   Dementia with behavioral disturbance (HCC) 12/10/2023   Dehydration 03/05/2023   Acute renal failure superimposed on stage 4 chronic kidney disease (HCC) 03/04/2023   Seizure (HCC) 03/04/2023   Other pancytopenia (HCC) 09/06/2022   Hypoglycemia 09/03/2022   Acute metabolic encephalopathy 09/02/2022   Blepharitis of right lower eyelid 09/02/2022   Dyslipidemia 09/02/2022   Pulmonary hypertension (HCC) 09/02/2022   Depression with anxiety 09/02/2022   GERD without esophagitis 09/02/2022   Metabolic acidosis 09/02/2022   Intractable pain 07/05/2022   Type II diabetes mellitus with renal manifestations (HCC) 07/05/2022   Over weight 07/05/2022   Thrombocytopenia (HCC) 07/05/2022   Fall at home, initial encounter 07/05/2022   Cellulitis of right hand 07/05/2022   COPD (chronic obstructive pulmonary disease) (HCC) 07/05/2022   Iron deficiency anemia 07/05/2022    Toxic encephalopathy 07/05/2022   Neuropathy 07/05/2022   Right wrist pain    Chronic pain syndrome 01/01/2021   Pharmacologic therapy 01/01/2021   Disorder of skeletal system 01/01/2021   Problems influencing health status 01/01/2021   Aortic atherosclerosis (HCC) 02/24/2020   Other emphysema (HCC) 02/24/2020   Medicare annual wellness visit, initial 02/03/2020   Neck pain 09/15/2018   Sacroiliac joint pain 09/08/2018   Lumbar spondylosis 06/11/2018   Left hip pain 06/11/2018   Chronic pain of both knees 06/11/2018   CKD (chronic kidney disease) stage 4, GFR 15-29 ml/min (HCC) 10/29/2017   Obesity (BMI 30-39.9) 11/27/2015   Tobacco use 06/13/2015   Essential hypertension 06/13/2015   Mixed hyperlipidemia 06/13/2015   Osteoarthritis 06/13/2015   Biceps tendinitis 04/06/2015   Epilepsy without status epilepticus, not intractable (HCC) 12/10/2013    Orientation RESPIRATION BLADDER Height & Weight     Self  Normal Incontinent Weight: 90.7 kg Height:  6' (182.9 cm)  BEHAVIORAL SYMPTOMS/MOOD NEUROLOGICAL BOWEL NUTRITION STATUS      Incontinent Diet (Dys 1, thin liquid)  AMBULATORY STATUS COMMUNICATION OF NEEDS Skin   Extensive Assist Verbally Normal                       Personal Care Assistance Level of Assistance  Bathing, Feeding, Dressing Bathing Assistance: Maximum assistance Feeding assistance: Limited assistance Dressing Assistance: Maximum assistance     Functional Limitations Info  Sight, Hearing, Speech Sight Info: Adequate Hearing Info: Adequate Speech Info: Adequate    SPECIAL CARE FACTORS FREQUENCY  PT (By licensed PT), OT (By licensed OT)     PT Frequency: 5 times  per week OT Frequency: 5 times per week            Contractures Contractures Info: Not present    Additional Factors Info  Code Status, Allergies Code Status Info: full code Allergies Info: ozemoic, amlodaoine, gabapentin           Current Medications (12/16/2023):  This is  the current hospital active medication list Current Facility-Administered Medications  Medication Dose Route Frequency Provider Last Rate Last Admin   acetaminophen (TYLENOL) tablet 650 mg  650 mg Oral Q6H PRN Mansy, Jan A, MD   650 mg at 12/15/23 2037   Or   acetaminophen (TYLENOL) suppository 650 mg  650 mg Rectal Q6H PRN Mansy, Jan A, MD       albuterol (PROVENTIL) (2.5 MG/3ML) 0.083% nebulizer solution 2.5 mg  2.5 mg Inhalation Q6H PRN Mansy, Jan A, MD       donepezil (ARICEPT) tablet 10 mg  10 mg Oral QHS Mansy, Jan A, MD   10 mg at 12/15/23 2037   fenofibrate tablet 160 mg  160 mg Oral Daily Mansy, Jan A, MD   160 mg at 12/16/23 0909   fluticasone furoate-vilanterol (BREO ELLIPTA) 100-25 MCG/ACT 1 puff  1 puff Inhalation Daily Mansy, Jan A, MD   1 puff at 12/16/23 0910   And   umeclidinium bromide (INCRUSE ELLIPTA) 62.5 MCG/ACT 1 puff  1 puff Inhalation Daily Mansy, Jan A, MD   1 puff at 12/16/23 0910   hydrOXYzine (ATARAX) tablet 10 mg  10 mg Oral TID PRN Mansy, Jan A, MD   10 mg at 12/16/23 0908   lactulose (CHRONULAC) 10 GM/15ML solution 30 g  30 g Oral TID Mansy, Jan A, MD   30 g at 12/16/23 0908   magnesium hydroxide (MILK OF MAGNESIA) suspension 30 mL  30 mL Oral Daily PRN Mansy, Jan A, MD       naloxone [facility]) nasal spray 4 mg/0.1 mL  1 spray Nasal Once PRN Mansy, Jan A, MD       ondansetron [facility]) tablet 4 mg  4 mg Oral Q6H PRN Mansy, Jan A, MD       Or   ondansetron [facility]) injection 4 mg  4 mg Intravenous Q6H PRN Mansy, Jan A, MD       pantoprazole (PROTONIX) EC tablet 40 mg  40 mg Oral Daily Mansy, Jan A, MD   40 mg at 12/16/23 4098   rosuvastatin (CRESTOR) tablet 20 mg  20 mg Oral QHS Mansy, Jan A, MD   20 mg at 12/15/23 2037   traZODone (DESYREL) tablet 25 mg  25 mg Oral QHS PRN Mansy, Jan A, MD   25 mg at 12/15/23 2037   valproic acid (DEPAKENE) 250 MG/5ML solution 625 mg  625 mg Oral Q6H Loyce Dys, MD   625 mg at 12/16/23 0909     Discharge  Medications: Please see discharge summary for a list of discharge medications.  Relevant Imaging Results:  Relevant Lab Results:   Additional Information SS #: [id number]  Kymari Lollis Seward Carol, RN

## 2023-12-16 NOTE — Progress Notes (Signed)
 [facility]  ROUNDING NOTE   Subjective:  Patient sitting up in bed Alert Tolerating small meals  Sodium 142   Objective:  Vital signs in last 24 hours:  Temp:  [98.1 F (36.7 C)-99.1 F (37.3 C)] 98.7 F (37.1 C) (03/18 0745) Pulse Rate:  [65-91] 65 (03/18 0745) Resp:  [16-20] 16 (03/18 0745) BP: (115-131)/(60-96) 131/64 (03/18 0745) SpO2:  [97 %-100 %] 99 % (03/18 0745)  Weight change:  Filed Weights   12/09/23 1918  Weight: 90.7 kg    Intake/Output: I/O last 3 completed shifts: In: -  Out: 1350 [Urine:1350]   Intake/Output this shift:  Total I/O In: 240 [P.O.:240] Out: -   Physical Exam: General: NAD  Head: Normocephalic, atraumatic. Moist oral mucosal membranes  Eyes: Anicteric  Lungs:  Clear to auscultation  Heart: Regular rate and rhythm  Abdomen:  Soft, nontender  Extremities: No peripheral edema.  Neurologic: Alert, moving all four extremities  Skin: No lesions  Access: None    Basic Metabolic Panel: Recent Labs  Lab 12/12/23 0453 12/13/23 0315 12/14/23 0620 12/15/23 0247 12/16/23 0424  NA 151* 150* 144 146* 142  K 3.7 3.8 3.5 4.5 3.7  CL 120* 119* 115* 117* 112*  CO2 23 25 26 22 23   GLUCOSE 77 91 91 113* 110*  BUN 34* 27* 22 27* 26*  CREATININE 2.28* 2.10* 1.93* 1.96* 1.89*  CALCIUM 9.1 9.0 8.7* 9.0 9.2    Liver Function Tests: Recent Labs  Lab 12/09/23 1955  AST 29  ALT 12  ALKPHOS 47  BILITOT 1.0  PROT 7.0  ALBUMIN 3.0*   No results for input(s): "LIPASE", "AMYLASE" in the last 168 hours. Recent Labs  Lab 12/14/23 0620 12/15/23 0247 12/16/23 0424  AMMONIA 70* 79* 56*    CBC: Recent Labs  Lab 12/12/23 0453 12/13/23 0315 12/14/23 0620 12/15/23 0247 12/16/23 0424  WBC 2.7* 2.6* 2.9* 2.7* 3.8*  NEUTROABS 1.3* 1.1* 1.3* 1.2* 2.1  HGB 9.8* 9.2* 9.2* 9.5* 9.1*  HCT 30.3* 28.7* 28.9* 30.3* 28.2*  MCV 103.4* 104.0* 102.8* 106.3* 103.3*  PLT 80* 66* 50* 41* 45*    Cardiac Enzymes: No results for  input(s): "CKTOTAL", "CKMB", "CKMBINDEX", "TROPONINI" in the last 168 hours.  BNP: Invalid input(s): "POCBNP"  CBG: Recent Labs  Lab 12/09/23 1921  GLUCAP 79    Microbiology: Results for orders placed or performed during the hospital encounter of 09/01/22  Blood Culture (routine x 2)     Status: None   Collection Time: 09/01/22  6:41 PM   Specimen: BLOOD  Result Value Ref Range Status   Specimen Description BLOOD BLOOD LEFT ARM  Final   Special Requests   Final    BOTTLES DRAWN AEROBIC AND ANAEROBIC Blood Culture adequate volume   Culture   Final    NO GROWTH 5 DAYS Performed at [facility], [address], [place], [place] [address]    Report Status 09/06/2022 FINAL  Final  SARS Coronavirus 2 by RT PCR (hospital order, performed in [facility] hospital lab) *cepheid single result test* Anterior Nasal Swab     Status: None   Collection Time: 09/01/22  6:42 PM   Specimen: Anterior Nasal Swab  Result Value Ref Range Status   SARS Coronavirus 2 by RT PCR NEGATIVE NEGATIVE Final    Comment: (NOTE) SARS-CoV-2 target nucleic acids are NOT DETECTED.  The SARS-CoV-2 RNA is generally detectable in upper and lower respiratory specimens during the acute phase of infection. The lowest  concentration of SARS-CoV-2 viral copies this assay can detect is 250 copies / mL. A negative result does not preclude SARS-CoV-2 infection and should not be used as the sole basis for treatment or other patient management decisions.  A negative result may occur with improper specimen collection / handling, submission of specimen other than nasopharyngeal swab, presence of viral mutation(s) within the areas targeted by this assay, and inadequate number of viral copies (<250 copies / mL). A negative result must be combined with clinical observations, patient history, and epidemiological information.  Fact Sheet for Patients:   RoadLapTop.co.za  Fact Sheet for  Healthcare Providers: http://kim-miller.com/  This test is not yet approved or  cleared by the [place] FDA and has been authorized for detection and/or diagnosis of SARS-CoV-2 by FDA under an Emergency Use Authorization (EUA).  This EUA will remain in effect (meaning this test can be used) for the duration of the COVID-19 declaration under Section 564(b)(1) of the Act, 21 U.S.C. section 360bbb-3(b)(1), unless the authorization is terminated or revoked sooner.  Performed at [facility], [address] [place], [place] [address]   Culture, blood (Routine X 2) w Reflex to ID Panel     Status: None   Collection Time: 09/03/22  2:20 AM   Specimen: BLOOD  Result Value Ref Range Status   Specimen Description BLOOD BLOOD RIGHT ARM  Final   Special Requests   Final    IN BOTH AEROBIC AND ANAEROBIC BOTTLES Blood Culture adequate volume   Culture   Final    NO GROWTH 5 DAYS Performed at [facility], [address], [place], [place] [address]    Report Status 09/08/2022 FINAL  Final    Coagulation Studies: No results for input(s): "LABPROT", "INR" in the last 72 hours.  Urinalysis: No results for input(s): "COLORURINE", "LABSPEC", "PHURINE", "GLUCOSEU", "HGBUR", "BILIRUBINUR", "KETONESUR", "PROTEINUR", "UROBILINOGEN", "NITRITE", "LEUKOCYTESUR" in the last 72 hours.  Invalid input(s): "APPERANCEUR"    Imaging: No results found.   Medications:     donepezil  10 mg Oral QHS   fenofibrate  160 mg Oral Daily   fluticasone furoate-vilanterol  1 puff Inhalation Daily   And   umeclidinium bromide  1 puff Inhalation Daily   lactulose  30 g Oral TID   pantoprazole  40 mg Oral Daily   rosuvastatin  20 mg Oral QHS   valproic acid  625 mg Oral Q6H   acetaminophen **OR** acetaminophen, albuterol, hydrOXYzine, magnesium hydroxide, naloxone, ondansetron **OR** ondansetron (ZOFRAN) IV, traZODone  Assessment/ Plan:  Albert Figueroa  is a  68 year old male with past medical conditions including hypertension, seizure disorder, osteoarthritis, type 2 diabetes, dyslipidemia, and chronic kidney disease stage IIIb.  Patient presents to the emergency department with altered mental status and weakness and has been admitted for Hyperammonemia (HCC) [E72.20] AKI (acute kidney injury) (HCC) [N17.9] Acute renal failure, unspecified acute renal failure type (HCC) [N17.9] Altered mental status, unspecified altered mental status type [R41.82]     Acute Kidney Injury on chronic kidney disease stage IIIb with baseline creatinine 1.9 and GFR of 38 on 08/11/23.  Acute kidney injury secondary to dehydration.  Agree with holding dapagliflozin, furosemide, lisinopril. Update:  Renal function has returned to bedside. Continue to encourage oral intake.    Lab Results  Component Value Date   CREATININE 1.89 (H) 12/16/2023   CREATININE 1.96 (H) 12/15/2023   CREATININE 1.93 (H) 12/14/2023     2.  Hypernatremia, likely secondary to dehydration.  Sodium  151 on admission.  Sodium 142.    3.  Hypertension with chronic kidney disease.  Home regimen includes furosemide and lisinopril.  Both medications held at the moment.   4. Anemia of chronic kidney disease Macrocytic Continue to monitor hemoglobin periodically.  No urgent need for Epogen at the moment..   LOS: 7 Brandace Cargle 3/18/20253:46 PM

## 2023-12-16 NOTE — Progress Notes (Signed)
 Progress Note   Patient: Albert Figueroa QMV:[id number] DOB: [birth year] DOA: 12/09/2023     7 DOS: the patient was seen and examined on 12/16/2023     Brief hospital course: Albert Figueroa is a 68 y.o. Caucasian male with medical history significant for type 2 diabetes mellitus, COPD, hypertension, dyslipidemia, stage III chronic kidney disease, Lewy body dementia seizure disorder and stage IV chronic kidney disease who presented to the emergency room with acute onset of altered mental status with confusion as well as generalized weakness over the last couple weeks.  ED Course: When he came to the ER, BP was 95/56 with otherwise normal vital signs.  BP later on was 111/64.  Labs revealed hypernatremia of 151 and hyperkalemia 119 with a BUN of 68 and creatinine 4.73 above previous levels, albumin 3 and direct bili 0.4.  Ammonia level came back elevated at 62.  EKG as reviewed by me : EKG showed sinus bradycardia with rate of 57 and T wave version inferiorly.  TRH was therefore consulted to admit patient for further management     Assessment and Plan:  Acute kidney injury superimposed on chronic kidney disease (HCC) - The patient be admitted to a medical telemetry bed. - Renal function improved with IV fluids Continue oral hydration - Continue to avoid nephrotoxins. Plan of care discussed with nephrologist   Acute hepatic encephalopathy (HCC)-improved Continue on p.o. lactulose. - Ammonia level peaked at 126   Pancytopenia likely secondary to above Thrombocytopenia Subcutaneous heparin on hold Follow-up on vitamin B12, folic acid levels 4 T-score low probability   Acute metabolic encephalopathy-improved Likely secondary to above - Management as above.   Hypernatremia-resolved Nephrologist on board Patient received D5 W Continue to monitor electrolytes   Dyslipidemia Continue statin therapy and fenofibrate.   Essential hypertension - Continue antihypertensive therapy.   Dementia  with behavioral disturbance (HCC) Continue Aricept and Depakote ER   Type 2 diabetes mellitus with chronic kidney disease, without long-term current use of insulin (HCC) - The patient will be placed on supplemental coverage with NovoLog. - Farxiga on hold on account of worsening renal function     DVT prophylaxis: Lovenox.    Advanced Care Planning:  Code Status: full code.    Family Communication: No family at bedside at this time   Disposition Plan: Back to previous home environment   Consults called: none.    Subjective:  Patient seen and examined at bedside this morning Denies chest pain worsening respiratory function or abdominal pain TOC working on placement Patient prefers to go home however he is too weak   Physical Exam:   GENERAL:  68 y.o.-year-old Caucasian male patient lying in the bed with no acute distress.  Drowsiness improved EYES: Pupils equal, round, reactive to light and accommodation. No scleral icterus. Extraocular muscles intact.  HEENT: Head atraumatic, normocephalic. Oropharynx and nasopharynx clear.  NECK:  Supple, no jugular venous distention. No thyroid enlargement, no tenderness.  LUNGS: Normal breath sounds bilaterally, no wheezing, rales,rhonchi or crepitation. No use of accessory muscles of respiration.  CARDIOVASCULAR: Regular rate and rhythm, S1, S2 normal. No murmurs, rubs, or gallops.  ABDOMEN: Soft, nondistended, nontender. Bowel sounds present. No organomegaly or mass.  EXTREMITIES: No pedal edema, cyanosis, or clubbing.  NEUROLOGIC: Altered mental status improved   Data Reviewed:   Vitals:   12/15/23 1734 12/15/23 2018 12/16/23 0409 12/16/23 0745  BP: 129/80 (!) 126/96 115/60 131/64  Pulse: 68 91 69 65  Resp: 18 19 20  16  Temp: 98.3 F (36.8 C) 98.1 F (36.7 C) 99.1 F (37.3 C) 98.7 F (37.1 C)  TempSrc:  Oral Oral   SpO2: 100% 100% 97% 99%  Weight:      Height:        Disposition: Patient pending skilled nursing facility  placement  Author: Loyce Dys, MD 12/16/2023 4:16 PM  For on call review [email or web address].

## 2023-12-17 DIAGNOSIS — N179 Acute kidney failure, unspecified: Secondary | ICD-10-CM | POA: Diagnosis not present

## 2023-12-17 DIAGNOSIS — N189 Chronic kidney disease, unspecified: Secondary | ICD-10-CM | POA: Diagnosis not present

## 2023-12-17 LAB — CBC WITH DIFFERENTIAL/PLATELET
Abs Immature Granulocytes: 0.05 10*3/uL (ref 0.00–0.07)
Basophils Absolute: 0 10*3/uL (ref 0.0–0.1)
Basophils Relative: 1 %
Eosinophils Absolute: 0 10*3/uL (ref 0.0–0.5)
Eosinophils Relative: 1 %
HCT: 30.5 % — ABNORMAL LOW (ref 39.0–52.0)
Hemoglobin: 9.9 g/dL — ABNORMAL LOW (ref 13.0–17.0)
Immature Granulocytes: 1 %
Lymphocytes Relative: 35 %
Lymphs Abs: 1.4 10*3/uL (ref 0.7–4.0)
MCH: 33.7 pg (ref 26.0–34.0)
MCHC: 32.5 g/dL (ref 30.0–36.0)
MCV: 103.7 fL — ABNORMAL HIGH (ref 80.0–100.0)
Monocytes Absolute: 0.4 10*3/uL (ref 0.1–1.0)
Monocytes Relative: 11 %
Neutro Abs: 2 10*3/uL (ref 1.7–7.7)
Neutrophils Relative %: 51 %
Platelets: 50 10*3/uL — ABNORMAL LOW (ref 150–400)
RBC: 2.94 MIL/uL — ABNORMAL LOW (ref 4.22–5.81)
RDW: 13.8 % (ref 11.5–15.5)
WBC: 3.9 10*3/uL — ABNORMAL LOW (ref 4.0–10.5)
nRBC: 0 % (ref 0.0–0.2)

## 2023-12-17 LAB — BASIC METABOLIC PANEL
Anion gap: 3 — ABNORMAL LOW (ref 5–15)
BUN: 31 mg/dL — ABNORMAL HIGH (ref 8–23)
CO2: 24 mmol/L (ref 22–32)
Calcium: 8.9 mg/dL (ref 8.9–10.3)
Chloride: 117 mmol/L — ABNORMAL HIGH (ref 98–111)
Creatinine, Ser: 2.13 mg/dL — ABNORMAL HIGH (ref 0.61–1.24)
GFR, Estimated: 34 mL/min — ABNORMAL LOW (ref >60)
Glucose, Bld: 108 mg/dL — ABNORMAL HIGH (ref 70–99)
Potassium: 4.2 mmol/L (ref 3.5–5.1)
Sodium: 144 mmol/L (ref 135–145)

## 2023-12-17 MED ORDER — VALPROIC ACID 250 MG/5ML PO SOLN
625.0000 mg | Freq: Four times a day (QID) | ORAL | Status: DC
  Filled 2023-12-17: qty 12.5

## 2023-12-17 MED ORDER — DIVALPROEX SODIUM 500 MG PO DR TAB
1000.0000 mg | DELAYED_RELEASE_TABLET | Freq: Two times a day (BID) | ORAL | Status: DC
  Administered 2023-12-17 – 2023-12-24 (×15): 1000 mg via ORAL
  Filled 2023-12-17 (×15): qty 2

## 2023-12-17 NOTE — TOC Progression Note (Signed)
 Transition of Care [facility]) - Progression Note    Patient Details  Name: Albert Figueroa MRN: [id number] Date of Birth: Dec 02, 1955  Transition of [facility]) CM/SW Contact  Marlowe Sax, RN Phone Number: 12/17/2023, 1:04 PM  Clinical Narrative:    No bed offers yet   Expected Discharge Plan: Skilled Nursing Facility Barriers to Discharge: SNF Pending bed offer, Insurance Authorization  Expected Discharge Plan and Services   Discharge Planning Services: CM Consult   Living arrangements for the past 2 months: Single Family Home                   DME Agency: NA       HH Arranged: NA           Social Determinants of Health (SDOH) Interventions SDOH Screenings   Food Insecurity: Patient Unable To Answer (12/10/2023)  Recent Concern: Food Insecurity - Food Insecurity Present (10/06/2023)   Received from [facility]  Housing: Patient Unable To Answer (12/10/2023)  Transportation Needs: Patient Unable To Answer (12/10/2023)  Utilities: Patient Unable To Answer (12/10/2023)  Financial Resource Strain: High Risk (10/06/2023)   Received from [facility]  Social Connections: Unknown (12/10/2023)  Tobacco Use: High Risk (12/09/2023)    Readmission Risk Interventions    03/06/2023    3:23 PM  Readmission Risk Prevention Plan  Transportation Screening Complete  PCP or Specialist Appt within 3-5 Days Complete  HRI or Home Care Consult --  Social Work Consult for Recovery Care Planning/Counseling Complete  Palliative Care Screening Not Applicable  Medication Review Oceanographer) Complete

## 2023-12-17 NOTE — Progress Notes (Signed)
 PROGRESS NOTE    Albert Figueroa  ZOX:[id number] DOB: [birth year] DOA: 12/09/2023 PCP: Marisue Ivan, MD   Assessment & Plan:   Principal Problem:   Acute kidney injury superimposed on chronic kidney disease (HCC) Active Problems:   Acute hepatic encephalopathy (HCC)   Acute metabolic encephalopathy   Essential hypertension   Dyslipidemia   Hypernatremia   Type 2 diabetes mellitus with chronic kidney disease, without long-term current use of insulin (HCC)   Dementia with behavioral disturbance (HCC)  Assessment and Plan: AKI on CKDIV: Cr is labile. Avoid nephrotoxic meds    Acute hepatic encephalopathy: ammonia level is elevated. Continue on lactulose    Pancytopenia: no need for transfusions currently. Will continue to monitor    Acute metabolic encephalopathy: continue w/ supportive care    Hypernatremia: resolved  HLD: continue on statin, fenofibrate    HTN: holding home dose of lisinopril as BP is WNL and AKI    Dementia: with behavioral disturbance. Continue on donepezil    DM2: likely well controlled, HbA1c 5.0 in 08/2022. No need for accuchecks currently. Holding home dose of farxiga secondary to AKI       DVT prophylaxis: SCDs Code Status: full  Family Communication:  Disposition Plan: likely d/c to SNF   Status is: Inpatient Remains inpatient appropriate because: severity of illness. Needs SNF placement     Level of care: Telemetry Medical Consultants:    Procedures:   Antimicrobials:    Subjective: Pt c/o fatigue   Objective: Vitals:   12/16/23 1728 12/16/23 2220 12/17/23 0321 12/17/23 0812  BP: 117/72 128/72 105/63 119/68  Pulse: 70 68 73 (!) 58  Resp: 16 19 18 16   Temp: 98.6 F (37 C) 97.9 F (36.6 C) 97.7 F (36.5 C) 98.4 F (36.9 C)  TempSrc: Oral  Oral   SpO2: 100% 99% 100% 98%  Weight:      Height:        Intake/Output Summary (Last 24 hours) at 12/17/2023 0837 Last data filed at 12/17/2023 0813 Gross per 24 hour   Intake 240 ml  Output 1300 ml  Net -1060 ml   Filed Weights   12/09/23 1918  Weight: 90.7 kg    Examination:  General exam: Appears calm and comfortable  Respiratory system: Clear to auscultation. Respiratory effort normal. Cardiovascular system: S1 & S2+. No rubs, gallops or clicks.  Gastrointestinal system: Abdomen is nondistended, soft and nontender. Normal bowel sounds heard. Central nervous system: AA&Ox3. Moves all extremities Psychiatry: Judgement and insight appears at baseline. Flat mood and mood      Data Reviewed: I have personally reviewed following labs and imaging studies  CBC: Recent Labs  Lab 12/13/23 0315 12/14/23 0620 12/15/23 0247 12/16/23 0424 12/17/23 0142  WBC 2.6* 2.9* 2.7* 3.8* 3.9*  NEUTROABS 1.1* 1.3* 1.2* 2.1 2.0  HGB 9.2* 9.2* 9.5* 9.1* 9.9*  HCT 28.7* 28.9* 30.3* 28.2* 30.5*  MCV 104.0* 102.8* 106.3* 103.3* 103.7*  PLT 66* 50* 41* 45* 50*   Basic Metabolic Panel: Recent Labs  Lab 12/13/23 0315 12/14/23 0620 12/15/23 0247 12/16/23 0424 12/17/23 0142  NA 150* 144 146* 142 144  K 3.8 3.5 4.5 3.7 4.2  CL 119* 115* 117* 112* 117*  CO2 25 26 22 23 24   GLUCOSE 91 91 113* 110* 108*  BUN 27* 22 27* 26* 31*  CREATININE 2.10* 1.93* 1.96* 1.89* 2.13*  CALCIUM 9.0 8.7* 9.0 9.2 8.9   GFR: Estimated Creatinine Clearance: 37.4 mL/min (A) (by C-G formula based on  SCr of 2.13 mg/dL (H)). Liver Function Tests: No results for input(s): "AST", "ALT", "ALKPHOS", "BILITOT", "PROT", "ALBUMIN" in the last 168 hours. No results for input(s): "LIPASE", "AMYLASE" in the last 168 hours. Recent Labs  Lab 12/12/23 0453 12/13/23 0315 12/14/23 0620 12/15/23 0247 12/16/23 0424  AMMONIA 86* 78* 70* 79* 56*   Coagulation Profile: No results for input(s): "INR", "PROTIME" in the last 168 hours. Cardiac Enzymes: No results for input(s): "CKTOTAL", "CKMB", "CKMBINDEX", "TROPONINI" in the last 168 hours. BNP (last 3 results) No results for input(s):  "PROBNP" in the last 8760 hours. HbA1C: No results for input(s): "HGBA1C" in the last 72 hours. CBG: No results for input(s): "GLUCAP" in the last 168 hours. Lipid Profile: No results for input(s): "CHOL", "HDL", "LDLCALC", "TRIG", "CHOLHDL", "LDLDIRECT" in the last 72 hours. Thyroid Function Tests: No results for input(s): "TSH", "T4TOTAL", "FREET4", "T3FREE", "THYROIDAB" in the last 72 hours. Anemia Panel: Recent Labs    12/15/23 1357  VITAMINB12 522  FOLATE 7.1   Sepsis Labs: No results for input(s): "PROCALCITON", "LATICACIDVEN" in the last 168 hours.  No results found for this or any previous visit (from the past 240 hours).       Radiology Studies: No results found.      Scheduled Meds:  donepezil  10 mg Oral QHS   fenofibrate  160 mg Oral Daily   fluticasone furoate-vilanterol  1 puff Inhalation Daily   And   umeclidinium bromide  1 puff Inhalation Daily   lactulose  30 g Oral TID   pantoprazole  40 mg Oral Daily   rosuvastatin  20 mg Oral QHS   valproic acid  625 mg Oral Q6H   Continuous Infusions:   LOS: 8 days      Charise Killian, MD [facility] Pager [phone]  If 7PM-7AM, please contact night-coverage [email or web address] 12/17/2023, 8:37 AM

## 2023-12-17 NOTE — Plan of Care (Signed)
  Problem: Education: Goal: Knowledge of General Education information will improve Description: Including pain rating scale, medication(s)/side effects and non-pharmacologic comfort measures Outcome: Not Progressing   Problem: Health Behavior/Discharge Planning: Goal: Ability to manage health-related needs will improve Outcome: Not Progressing   Problem: Clinical Measurements: Goal: Ability to maintain clinical measurements within normal limits will improve Outcome: Progressing Goal: Will remain free from infection Outcome: Progressing Goal: Diagnostic test results will improve Outcome: Progressing Goal: Respiratory complications will improve Outcome: Progressing Goal: Cardiovascular complication will be avoided Outcome: Progressing   Problem: Activity: Goal: Risk for activity intolerance will decrease Outcome: Not Progressing   Problem: Nutrition: Goal: Adequate nutrition will be maintained Outcome: Progressing   Problem: Coping: Goal: Level of anxiety will decrease Outcome: Progressing   Problem: Elimination: Goal: Will not experience complications related to bowel motility Outcome: Progressing Goal: Will not experience complications related to urinary retention Outcome: Progressing   Problem: Pain Managment: Goal: General experience of comfort will improve and/or be controlled Outcome: Progressing   Problem: Safety: Goal: Ability to remain free from injury will improve Outcome: Progressing   Problem: Skin Integrity: Goal: Risk for impaired skin integrity will decrease Outcome: Progressing

## 2023-12-17 NOTE — Progress Notes (Signed)
 Physical Therapy Treatment Patient Details Name: Albert Figueroa MRN: [id number] DOB: 02-24-1956 Today's Date: 12/17/2023   History of Present Illness Albert Figueroa is a 68 y.o. Caucasian male with medical history significant for type 2 diabetes mellitus, COPD, hypertension, dyslipidemia, stage III chronic kidney disease, Lewy body dementia seizure disorder and stage IV chronic kidney disease who presented to the emergency room 12/09/23 with acute onset of altered mental status with confusion since yesterday as well as generalized weakness over the last couple weeks.  He had a fall 12/08/23.    PT Comments  Pt was long sitting in bed upon arrival. He greets Chartered loss adjuster and remains pleasant throughout. Does endorse lack of intake due to being on puree/ dysphasia diet. MD/ RN made aware. Pt seemed to be much more alert overall today versus previous day. He knew he was in hospital in [place] however overall poor awareness of situation. Pt does have impulsivity concerns seen throughout session. Author attempted to reach pt's daughter post session as requested however no answer and did not have mailbox set up. Pt was able to exit L side of bed, stand to RW, and ambulate ~ 50 ft. Pt gets increasingly impulsive with increased fall risk noted once fatigued. Dc recs remain appropriate to maximize his independence and safety with all ADLs.    If plan is discharge home, recommend the following: A little help with walking and/or transfers;A little help with bathing/dressing/bathroom;Assistance with cooking/housework;Direct supervision/assist for medications management;Direct supervision/assist for financial management;Assist for transportation;Help with stairs or ramp for entrance;Supervision due to cognitive status     Equipment Recommendations  Other (comment) (Defer to next level of care)       Precautions / Restrictions Precautions Precautions: Fall Recall of Precautions/Restrictions: Impaired Restrictions Weight  Bearing Restrictions Per Provider Order: No     Mobility  Bed Mobility Overal bed mobility: Needs Assistance Bed Mobility: Supine to Sit, Sit to Supine  Supine to sit: Contact guard, HOB elevated Sit to supine: Contact guard assist General bed mobility comments: tcs only for improved sequencing to exit bed. pt is slightly impulsive    Transfers Overall transfer level: Needs assistance Equipment used: Rolling walker (2 wheels) Transfers: Sit to/from Stand Sit to Stand: Contact guard assist, Min assist  General transfer comment: pt stood EOB 4 x during session. CGA by the last attempt for safety    Ambulation/Gait Ambulation/Gait assistance: Contact guard assist, Min assist Gait Distance (Feet): 50 Feet Assistive device: Rolling walker (2 wheels) Gait Pattern/deviations: Trunk flexed, Step-through pattern Gait velocity: WNL  General Gait Details: Pt was able to tolerate ambulation ~ 50 ft with mostly CGA however once fatigued min assist due to pt's increased impulsivity and poor safety awareness. Thereasa Parkin feels pt fatigued quickly due to lack of mobility over past two weeks   Balance Overall balance assessment: Needs assistance Sitting-balance support: Feet supported Sitting balance-Leahy Scale: Fair Sitting balance - Comments: pt occasionally had posterior L lateral lean. Able to correct with vcs only   Standing balance support: Bilateral upper extremity supported, During functional activity, Reliant on assistive device for balance Standing balance-Leahy Scale: Fair Standing balance comment: pt remains at risk of falls mostly due to cognition and impulsivity not balance deficits       Communication Communication Communication: No apparent difficulties  Cognition Arousal: Alert Behavior During Therapy: WFL for tasks assessed/performed, Impulsive (slightly impulsive but was easy to redirect)   PT - Cognitive impairments: No family/caregiver present to determine baseline  (Attempted to call  daughter however no answer and without a voicemail box)   PT - Cognition Comments: pt knew he was in the hospital at [facility] but overall disorieted to situation/ day/time Following commands: Intact      Cueing Cueing Techniques: Verbal cues     General Comments General comments (skin integrity, edema, etc.): discussed pt's dysphasia diet with pt/MD/RN. MD will assess when rounded on pt.      Pertinent Vitals/Pain Pain Assessment Pain Assessment: No/denies pain     PT Goals (current goals can now be found in the care plan section) Acute Rehab PT Goals Patient Stated Goal: go home Progress towards PT goals: Progressing toward goals    Frequency    Min 2X/week       AM-PAC PT "6 Clicks" Mobility   Outcome Measure  Help needed turning from your back to your side while in a flat bed without using bedrails?: A Little Help needed moving from lying on your back to sitting on the side of a flat bed without using bedrails?: A Little Help needed moving to and from a bed to a chair (including a wheelchair)?: A Little Help needed standing up from a chair using your arms (e.g., wheelchair or bedside chair)?: A Little Help needed to walk in hospital room?: A Little Help needed climbing 3-5 steps with a railing? : A Lot 6 Click Score: 17    End of Session Equipment Utilized During Treatment: Gait belt Activity Tolerance: Patient tolerated treatment well;Patient limited by fatigue Patient left: in bed;with bed alarm set;with nursing/sitter in room Nurse Communication: Mobility status PT Visit Diagnosis: Unsteadiness on feet (R26.81);Other abnormalities of gait and mobility (R26.89);Muscle weakness (generalized) (M62.81);History of falling (Z91.81);Difficulty in walking, not elsewhere classified (R26.2)     Time: 1010-1031 PT Time Calculation (min) (ACUTE ONLY): 21 min  Charges:    $Therapeutic Activity: 8-22 mins PT General Charges $$ ACUTE PT VISIT: 1  Visit                     Jetta Lout PTA 12/17/23, 11:29 AM

## 2023-12-18 DIAGNOSIS — N189 Chronic kidney disease, unspecified: Secondary | ICD-10-CM | POA: Diagnosis not present

## 2023-12-18 DIAGNOSIS — N179 Acute kidney failure, unspecified: Secondary | ICD-10-CM | POA: Diagnosis not present

## 2023-12-18 LAB — BASIC METABOLIC PANEL
Anion gap: 8 (ref 5–15)
BUN: 39 mg/dL — ABNORMAL HIGH (ref 8–23)
CO2: 23 mmol/L (ref 22–32)
Calcium: 8.7 mg/dL — ABNORMAL LOW (ref 8.9–10.3)
Chloride: 110 mmol/L (ref 98–111)
Creatinine, Ser: 2.31 mg/dL — ABNORMAL HIGH (ref 0.61–1.24)
GFR, Estimated: 30 mL/min — ABNORMAL LOW (ref >60)
Glucose, Bld: 139 mg/dL — ABNORMAL HIGH (ref 70–99)
Potassium: 4 mmol/L (ref 3.5–5.1)
Sodium: 141 mmol/L (ref 135–145)

## 2023-12-18 LAB — CBC
HCT: 29.4 % — ABNORMAL LOW (ref 39.0–52.0)
Hemoglobin: 9.6 g/dL — ABNORMAL LOW (ref 13.0–17.0)
MCH: 33.8 pg (ref 26.0–34.0)
MCHC: 32.7 g/dL (ref 30.0–36.0)
MCV: 103.5 fL — ABNORMAL HIGH (ref 80.0–100.0)
Platelets: 68 10*3/uL — ABNORMAL LOW (ref 150–400)
RBC: 2.84 MIL/uL — ABNORMAL LOW (ref 4.22–5.81)
RDW: 13.6 % (ref 11.5–15.5)
WBC: 4.7 10*3/uL (ref 4.0–10.5)
nRBC: 0 % (ref 0.0–0.2)

## 2023-12-18 LAB — AMMONIA: Ammonia: 42 umol/L — ABNORMAL HIGH (ref 9–35)

## 2023-12-18 MED ORDER — HALOPERIDOL LACTATE 5 MG/ML IJ SOLN: 5.0000 mg | INTRAMUSCULAR | Status: AC

## 2023-12-18 NOTE — TOC Progression Note (Signed)
 Transition of Care [facility]) - Progression Note    Patient Details  Name: Albert Figueroa MRN: [id number] Date of Birth: 10-09-1955  Transition of Care [facility]) CM/SW Contact  Marlowe Sax, RN Phone Number: 12/18/2023, 12:14 PM  Clinical Narrative:     Met with the patient and his daughter in the room, we reviewed his options to go to STR, He is understanding that going home is not a good option with his balance and strength, he is a 2 plus to stand and get the the chair, He is agreeable to wait for [place] to get a rehab bed and Ins approval, his daughter is going to get him lunch, he is calm and more alert and oriented now  Expected Discharge Plan: Skilled Nursing Facility Barriers to Discharge: SNF Pending bed offer, Insurance Authorization  Expected Discharge Plan and Services   Discharge Planning Services: CM Consult   Living arrangements for the past 2 months: Single Family Home                   DME Agency: NA       HH Arranged: NA           Social Determinants of Health (SDOH) Interventions SDOH Screenings   Food Insecurity: Patient Unable To Answer (12/10/2023)  Recent Concern: Food Insecurity - Food Insecurity Present (10/06/2023)   Received from [facility]  Housing: Patient Unable To Answer (12/10/2023)  Transportation Needs: Patient Unable To Answer (12/10/2023)  Utilities: Patient Unable To Answer (12/10/2023)  Financial Resource Strain: High Risk (10/06/2023)   Received from [facility]  Social Connections: Unknown (12/10/2023)  Tobacco Use: High Risk (12/09/2023)    Readmission Risk Interventions    03/06/2023    3:23 PM  Readmission Risk Prevention Plan  Transportation Screening Complete  PCP or Specialist Appt within 3-5 Days Complete  HRI or Home Care Consult --  Social Work Consult for Recovery Care Planning/Counseling Complete  Palliative Care Screening Not Applicable  Medication Review Oceanographer) Complete

## 2023-12-18 NOTE — TOC Progression Note (Signed)
 Transition of Care [facility]) - Progression Note    Patient Details  Name: Albert Figueroa MRN: [id number] Date of Birth: 1955/10/21  [facility]) CM/SW Contact  Marlowe Sax, RN Phone Number: 12/18/2023, 8:56 AM  Clinical Narrative:     No bed offers available, sent out thruoutt the hub again  Expected Discharge Plan: Skilled Nursing Facility Barriers to Discharge: SNF Pending bed offer, Insurance Authorization  Expected Discharge Plan and Services   Discharge Planning Services: CM Consult   Living arrangements for the past 2 months: Single Family Home                   DME Agency: NA       HH Arranged: NA           Social Determinants of Health (SDOH) Interventions SDOH Screenings   Food Insecurity: Patient Unable To Answer (12/10/2023)  Recent Concern: Food Insecurity - Food Insecurity Present (10/06/2023)   Received from [facility]  Housing: Patient Unable To Answer (12/10/2023)  Transportation Needs: Patient Unable To Answer (12/10/2023)  Utilities: Patient Unable To Answer (12/10/2023)  Financial Resource Strain: High Risk (10/06/2023)   Received from [facility]  Social Connections: Unknown (12/10/2023)  Tobacco Use: High Risk (12/09/2023)    Readmission Risk Interventions    03/06/2023    3:23 PM  Readmission Risk Prevention Plan  Transportation Screening Complete  PCP or Specialist Appt within 3-5 Days Complete  HRI or Home Care Consult --  Social Work Consult for Recovery Care Planning/Counseling Complete  Palliative Care Screening Not Applicable  Medication Review Oceanographer) Complete

## 2023-12-18 NOTE — Progress Notes (Signed)
 PROGRESS NOTE    Albert Figueroa  QQV:[id number] DOB: 04-16-1956 DOA: 12/09/2023 PCP: Marisue Ivan, MD   Assessment & Plan:   Principal Problem:   Acute kidney injury superimposed on chronic kidney disease (HCC) Active Problems:   Acute hepatic encephalopathy (HCC)   Acute metabolic encephalopathy   Essential hypertension   Dyslipidemia   Hypernatremia   Type 2 diabetes mellitus with chronic kidney disease, without long-term current use of insulin (HCC)   Dementia with behavioral disturbance (HCC)  Assessment and Plan: AKI on CKDIV: Cr is trending up today. Avoid nephrotoxic meds   Acute hepatic encephalopathy: ammonia level is elevated. Continue on lactulose    Bicytopenia:  WBC is now WNL. No need for a transfusion currently.    Acute metabolic encephalopathy: continue w/ supportive care    Hypernatremia: resolved  HLD: continue on fenofibrate, statin    HTN: holding home dose of lisinopril as BP is WNL & AKI on CKD    Dementia: with behavioral disturbance. Continue on donepezil   DM2: likely well controlled, HbA1c 5.0 in 08/2022. No need for accuchecks currently. Holding home dose of farxiga secondary to AKI       DVT prophylaxis: SCDs Code Status: full  Family Communication: discussed pt's care w/ pt's daughter, [name], and answered her questions  Disposition Plan: likely d/c to SNF   Status is: Inpatient Remains inpatient appropriate because: severity of illness. Waiting on SNF placement, no bed offers yet    Level of care: Telemetry Medical Consultants:    Procedures:   Antimicrobials:    Subjective: Pt is intermittently confused  Objective: Vitals:   12/16/23 2220 12/17/23 0321 12/17/23 0812 12/17/23 2201  BP: 128/72 105/63 119/68 103/71  Pulse: 68 73 (!) 58 66  Resp: 19 18 16 20   Temp: 97.9 F (36.6 C) 97.7 F (36.5 C) 98.4 F (36.9 C) 98.4 F (36.9 C)  TempSrc:  Oral  Oral  SpO2: 99% 100% 98% 99%  Weight:      Height:         Intake/Output Summary (Last 24 hours) at 12/18/2023 0837 Last data filed at 12/18/2023 0700 Gross per 24 hour  Intake 225 ml  Output 825 ml  Net -600 ml   Filed Weights   12/09/23 1918  Weight: 90.7 kg    Examination:  General exam: Appears comfortable  Respiratory system: clear breath sounds b/l  Cardiovascular system: S1/S2+. No rubs or clicks  Gastrointestinal system: abd is soft, NT, ND & hypoactive bowel sounds  Central nervous system: AA&Ox3. Moves all extremities Psychiatry: judgement and insight appears at baseline. Flat mood and affect     Data Reviewed: I have personally reviewed following labs and imaging studies  CBC: Recent Labs  Lab 12/13/23 0315 12/14/23 0620 12/15/23 0247 12/16/23 0424 12/17/23 0142 12/18/23 0457  WBC 2.6* 2.9* 2.7* 3.8* 3.9* 4.7  NEUTROABS 1.1* 1.3* 1.2* 2.1 2.0  --   HGB 9.2* 9.2* 9.5* 9.1* 9.9* 9.6*  HCT 28.7* 28.9* 30.3* 28.2* 30.5* 29.4*  MCV 104.0* 102.8* 106.3* 103.3* 103.7* 103.5*  PLT 66* 50* 41* 45* 50* 68*   Basic Metabolic Panel: Recent Labs  Lab 12/14/23 0620 12/15/23 0247 12/16/23 0424 12/17/23 0142 12/18/23 0457  NA 144 146* 142 144 141  K 3.5 4.5 3.7 4.2 4.0  CL 115* 117* 112* 117* 110  CO2 26 22 23 24 23   GLUCOSE 91 113* 110* 108* 139*  BUN 22 27* 26* 31* 39*  CREATININE 1.93* 1.96* 1.89* 2.13*  2.31*  CALCIUM 8.7* 9.0 9.2 8.9 8.7*   GFR: Estimated Creatinine Clearance: 34.5 mL/min (A) (by C-G formula based on SCr of 2.31 mg/dL (H)). Liver Function Tests: No results for input(s): "AST", "ALT", "ALKPHOS", "BILITOT", "PROT", "ALBUMIN" in the last 168 hours. No results for input(s): "LIPASE", "AMYLASE" in the last 168 hours. Recent Labs  Lab 12/13/23 0315 12/14/23 0620 12/15/23 0247 12/16/23 0424 12/18/23 0457  AMMONIA 78* 70* 79* 56* 42*   Coagulation Profile: No results for input(s): "INR", "PROTIME" in the last 168 hours. Cardiac Enzymes: No results for input(s): "CKTOTAL", "CKMB",  "CKMBINDEX", "TROPONINI" in the last 168 hours. BNP (last 3 results) No results for input(s): "PROBNP" in the last 8760 hours. HbA1C: No results for input(s): "HGBA1C" in the last 72 hours. CBG: No results for input(s): "GLUCAP" in the last 168 hours. Lipid Profile: No results for input(s): "CHOL", "HDL", "LDLCALC", "TRIG", "CHOLHDL", "LDLDIRECT" in the last 72 hours. Thyroid Function Tests: No results for input(s): "TSH", "T4TOTAL", "FREET4", "T3FREE", "THYROIDAB" in the last 72 hours. Anemia Panel: Recent Labs    12/15/23 1357  VITAMINB12 522  FOLATE 7.1   Sepsis Labs: No results for input(s): "PROCALCITON", "LATICACIDVEN" in the last 168 hours.  No results found for this or any previous visit (from the past 240 hours).       Radiology Studies: No results found.      Scheduled Meds:  divalproex  1,000 mg Oral Q12H   donepezil  10 mg Oral QHS   fenofibrate  160 mg Oral Daily   fluticasone furoate-vilanterol  1 puff Inhalation Daily   And   umeclidinium bromide  1 puff Inhalation Daily   lactulose  30 g Oral TID   pantoprazole  40 mg Oral Daily   rosuvastatin  20 mg Oral QHS   Continuous Infusions:   LOS: 9 days      Charise Killian, MD [facility] Pager [phone]  If 7PM-7AM, please contact night-coverage [email or web address] 12/18/2023, 8:37 AM

## 2023-12-18 NOTE — Plan of Care (Signed)

## 2023-12-18 NOTE — Evaluation (Addendum)
 Clinical/Bedside Swallow Evaluation Patient Details  Name: Albert Figueroa MRN: [id number] Date of Birth: Nov 09, 1955  Today's Date: 12/18/2023 Time: SLP Start Time (ACUTE ONLY): 1430 SLP Stop Time (ACUTE ONLY): 1520 SLP Time Calculation (min) (ACUTE ONLY): 50 min  Past Medical History:  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, spinal meningitis   Hyperlipidemia    Hypertension    Kidney disease    stage 3   Lewy body dementia (HCC)    short term memory issues only   Lumbar stenosis    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 in the 1980s"   Osteoarthrosis    Seizure (HCC)    none since 2009   Stage 4 chronic kidney disease (HCC)    Tremor of both hands    Past Surgical History:  Past Surgical History:  Procedure Laterality Date   CATARACT EXTRACTION W/PHACO Right 05/14/2023   Procedure: CATARACT EXTRACTION PHACO AND INTRAOCULAR LENS PLACEMENT (IOC) RIGHT DIABETIC;  Surgeon: Lockie Mola, MD;  Location: [facility];  Service: Ophthalmology;  Laterality: Right;  6.70 0:39.8   CATARACT EXTRACTION W/PHACO Left 05/28/2023   Procedure: CATARACT EXTRACTION PHACO AND INTRAOCULAR LENS PLACEMENT (IOC) LEFT DIABETIC 6.34 00:37.1;  Surgeon: Lockie Mola, MD;  Location: [facility];  Service: Ophthalmology;  Laterality: Left;   CSF SHUNT     placed in 1980s   KNEE SURGERY     multiple surgeries on both knees   HPI:  Pt is a 68 y.o. Caucasian male with medical history significant for type 2 diabetes mellitus, COPD, hypertension, dyslipidemia, stage III chronic kidney disease, Lewy body Dementia seizure disorder and stage IV chronic kidney disease who presented to the emergency room 12/09/23 with onset of AKI, Hyperammonemia, renal failure, and altered mental status with confusion as well as  generalized weakness over the last couple weeks.  He had a fall 12/08/23.  OF NOTE: pt has been assessed for cognitive-communication deficits and per Family member has been seen/followed by Neurology d/t Cognitive decline at baseline.   Head CT: No acute intracranial abnormality.  2. Right frontal encephalomalacia.  3. Right posterior approach shunt catheter.  No chest imaging.    Assessment / Plan / Recommendation  Clinical Impression   Pt seen for BSE. Pt awake, resting in bed. Verbal and followed instructions w/ cue. Pt was eager to get at diet of "regular food again". [employer] had been brought in for pt to eat -- ~1/2-3/4 of the meal eaten; NSG reported no deficits. Pt has a Baseline of Cognitive-communication decline per previous assessment 06/2022(see note indicating his followed by Neurology d/t Cognitive decline at Baseline).  Pt requesting something to drink.  On RA, afebrile. WBC WNL.    Pt appears to present w/ functional oropharyngeal phase swallow function in setting of Edentulous status and Cognitive presentation w/ No overt oropharyngeal phase deficits nor neuromuscular deficits noted. Pt consumed po trials w/ No overt, clinical s/s of aspiration during po trials. Pt appears at reduced risk for aspiration following general aspiration precautions and when using a slightly modified diet consistency of CUT meats/tougher foods for ease of mashing/gumming in setting of Edentulous status and decreased awareness of self/deficits/choosing easy to eat foods.  Pt does have challenging factors that could impact oropharyngeal  phase swallowing as well as overall oral intake including deconditioned/weakness, cognitive-communication decline, and Edentulous status. Pt required MIN-MOD assistance in positioning upright for oral intake(decreased awareness of self, generalized weakness).   During po trials, pt consumed all consistencies w/ No overt coughing, decline in vocal quality, or change in respiratory  presentation during/post trials. O2 sats 98% when checked. Oral phase appeared grossly [facility] w/ timely bolus management, mashing/gumming, and control of bolus propulsion for A-P transfer for swallowing. Oral clearing achieved w/ all trial consistencies -- moistened, soft foods given for ease of oral phase d/t Edentulous status. Pt required Min TIME for bolus management d/t Edentulous status but the Time for mashing/gumming and orally clearing mouth was not overly lengthy. Moistening foods to support.  OM Exam appeared [facility] w/ no unilateral weakness noted. Speech clear. Pt helped to feed self given setup.   Recommend a more Mech Soft/Regular consistency diet w/ well-Cut meats, moistened foods(d/t Edentulous status; Thin liquids. Pt should help to feed self and hold Cup when drinking but could need some Support during meals d/t stiff fingers/hands. Recommend general aspiration including reduce distractions/talking during meals/po's and Reflux precautions. Pills WHOLE in Puree for safer, easier swallowing IF any difficulty noted when swallowing Pills w/ Water.    Education given on Pills in Puree; food consistencies and easy to eat options; general aspiration precautions to pt and NSG. Pt appears at/close to his Baseline re: oropharyngeal swallow function.  NSG to reconsult if any new needs arise. MD/NSG updated, agreed. Recommend Dietician f/u. Precautions posted in room, chart. Per chart notes, notable Cognitive deficits at Baseline per chart review and patient evaluations prior admissions. Suspect patient likely requires MOD assistance and supervision in home environment for ADLs and safety.  SLP Visit Diagnosis: Dysphagia, unspecified (R13.10) (Edentulous; Dementia)    Aspiration Risk   (reduced following general aspiration precautions)    Diet Recommendation   Thin;Age appropriate regular (CUT meats/foods; moistened) = a more Mech Soft/Regular consistency diet w/ well-Cut meats, moistened foods(d/t  Edentulous status; Thin liquids. Pt should help to feed self and hold Cup when drinking but could need some Support during meals d/t stiff fingers/hands. Recommend general aspiration including reduce distractions/talking during meals/po's and Reflux precautions.   Medication Administration: Whole meds with puree (IF needed per NSG)    Other  Recommendations Recommended Consults:  (Dietician) Oral Care Recommendations: Oral care BID;Patient independent with oral care (setup)    Recommendations for follow up therapy are one component of a multi-disciplinary discharge planning process, led by the attending physician.  Recommendations may be updated based on patient status, additional functional criteria and insurance authorization.  Follow up Recommendations No SLP follow up      Assistance Recommended at Discharge  Intermittent  Functional Status Assessment Patient has had a recent decline in their functional status and demonstrates the ability to make significant improvements in function in a reasonable and predictable amount of time.  Frequency and Duration  (n/a)   (n/a)       Prognosis Prognosis for improved oropharyngeal function: Fair (-Good) Barriers to Reach Goals: Cognitive deficits;Time post onset;Severity of deficits;Behavior Barriers/Prognosis Comment: Edentulous; Dementia      Swallow Study   General Date of Onset: 12/09/23 HPI: Pt is a 68 y.o. Caucasian male with medical history significant for type 2 diabetes mellitus, COPD, hypertension, dyslipidemia, stage III chronic kidney disease, Lewy body Dementia seizure disorder and stage IV chronic kidney disease who presented to the emergency room 12/09/23 with onset of AKI, Hyperammonemia,  renal failure, and altered mental status with confusion as well as generalized weakness over the last couple weeks.  He had a fall 12/08/23.  OF NOTE: pt has been assessed for cognitive-communication deficits and per Family member has been  seen/followed by Neurology d/t Cognitive decline at baseline.   Head CT: No acute intracranial abnormality.  2. Right frontal encephalomalacia.  3. Right posterior approach shunt catheter.  No chest imaging. Type of Study: Bedside Swallow Evaluation Previous Swallow Assessment: 06/2022; 08/2022 - mech soft diet, thins Diet Prior to this Study: Dysphagia 1 (pureed);Thin liquids (Level 0) (since admit) Temperature Spikes Noted: No (wbc 4.7) Respiratory Status: Room air History of Recent Intubation: No Behavior/Cognition: Alert;Cooperative;Pleasant mood;Confused;Distractible;Requires cueing (Baseline LB Dementia per chart) Oral Care Completed by SLP: Recent completion by staff Oral Cavity - Dentition: Edentulous Vision: Functional for self-feeding Self-Feeding Abilities: Able to feed self;Needs set up;Needs assist Patient Positioning: Upright in bed (MOD assist) Baseline Vocal Quality: Normal Volitional Cough: Strong Volitional Swallow: Able to elicit    Oral/Motor/Sensory Function Overall Oral Motor/Sensory Function: Within functional limits   Ice Chips Ice chips: Not tested   Thin Liquid Thin Liquid: Within functional limits Presentation: Self Fed;Straw (baseline in room: ~4 ozs)    Nectar Thick Nectar Thick Liquid: Not tested   Honey Thick Honey Thick Liquid: Not tested   Puree Puree: Within functional limits Presentation: Self Fed;Spoon (6 trials)   Solid     Solid: Within functional limits Presentation: Self Fed;Spoon (10 trials) Other Comments: moistened        Jerilynn Som, MS, CCC-SLP Speech Language Pathologist Rehab Services; [facility] [place] [facility] [place] [phone] (ascom) Eryka Dolinger 12/18/2023,3:38 PM

## 2023-12-18 NOTE — Progress Notes (Signed)
 Physical Therapy Treatment Patient Details Name: Albert Figueroa MRN: [id number] DOB: [birth year] Today's Date: 12/18/2023   History of Present Illness Albert Figueroa is a 68 y.o. Caucasian male with medical history significant for type 2 diabetes mellitus, COPD, hypertension, dyslipidemia, stage III chronic kidney disease, Lewy body dementia seizure disorder and stage IV chronic kidney disease who presented to the emergency room 12/09/23 with acute onset of altered mental status with confusion since yesterday as well as generalized weakness over the last couple weeks.  He had a fall 12/08/23.    PT Comments  Pt was long sitting in bed upon arrival. He is alert and agreeable to session but still overall disoriented to situation and safety concerns. Does voice wanting to go home and that his sister is coming to town from out of state to take care of him. Author questions reliability of this. Pt was able to tolerate getting OOB, standing to RW, and ambulating ~ 30 ft. Pt is impulsive and does have some balance concerns. Overall distance limited by fatigue. Acute PT will continue to follow and progress per current POC.    If plan is discharge home, recommend the following: A little help with walking and/or transfers;A little help with bathing/dressing/bathroom;Assistance with cooking/housework;Direct supervision/assist for medications management;Direct supervision/assist for financial management;Assist for transportation;Help with stairs or ramp for entrance;Supervision due to cognitive status     Equipment Recommendations  Other (comment) (defer to next level of care)       Precautions / Restrictions Precautions Precautions: Fall Recall of Precautions/Restrictions: Impaired Restrictions Weight Bearing Restrictions Per Provider Order: No     Mobility  Bed Mobility Overal bed mobility: Needs Assistance Bed Mobility: Supine to Sit  Supine to sit: Min assist   Transfers Overall transfer level: Needs  assistance Equipment used: Rolling walker (2 wheels) Transfers: Sit to/from Stand Sit to Stand: Contact guard assist   Ambulation/Gait Ambulation/Gait assistance: Contact guard assist Gait Distance (Feet): 30 Feet Assistive device: Rolling walker (2 wheels) Gait Pattern/deviations: Trunk flexed, Step-through pattern  General Gait Details: Pt was able to tolerate ambulation ~ 30 ft with mostly CGA however pt fatigues quickly with vcs for posture correction and imporved heel strike to toe off   Balance Overall balance assessment: Needs assistance Sitting-balance support: Feet supported    Standing balance support: Bilateral upper extremity supported, During functional activity, Reliant on assistive device for balance Standing balance-Leahy Scale: Fair    Hotel manager: No apparent difficulties  Cognition Arousal: Alert Behavior During Therapy: WFL for tasks assessed/performed, Impulsive   PT - Cognitive impairments: No family/caregiver present to determine baseline   PT - Cognition Comments: pt is alert and cooperative however author questions pt's overall awareness of situation. poor safety awareness observed throughout Following commands: Intact      Cueing Cueing Techniques: Verbal cues     General Comments General comments (skin integrity, edema, etc.): pt still c/o puree diet. RN aware      Pertinent Vitals/Pain Pain Assessment Pain Assessment: 0-10 Pain Score: 5  Pain Location: LBP Pain Descriptors / Indicators: Discomfort Pain Intervention(s): Limited activity within patient's tolerance, Monitored during session, Premedicated before session, Repositioned     PT Goals (current goals can now be found in the care plan section) Acute Rehab PT Goals Patient Stated Goal: go home Progress towards PT goals: Progressing toward goals    Frequency    Min 2X/week       AM-PAC PT "6 Clicks" Mobility   Outcome Measure  Help needed  turning from your back to your side while in a flat bed without using bedrails?: A Little Help needed moving from lying on your back to sitting on the side of a flat bed without using bedrails?: A Little Help needed moving to and from a bed to a chair (including a wheelchair)?: A Little Help needed standing up from a chair using your arms (e.g., wheelchair or bedside chair)?: A Little Help needed to walk in hospital room?: A Little Help needed climbing 3-5 steps with a railing? : A Lot 6 Click Score: 17    End of Session   Activity Tolerance: Patient tolerated treatment well;Patient limited by fatigue Patient left: in chair;with chair alarm set Nurse Communication: Mobility status PT Visit Diagnosis: Unsteadiness on feet (R26.81);Other abnormalities of gait and mobility (R26.89);Muscle weakness (generalized) (M62.81);History of falling (Z91.81);Difficulty in walking, not elsewhere classified (R26.2)     Time: 4098-1191 PT Time Calculation (min) (ACUTE ONLY): 29 min  Charges:    $Gait Training: 8-22 mins $Therapeutic Activity: 8-22 mins PT General Charges $$ ACUTE PT VISIT: 1 Visit                    Jetta Lout PTA 12/18/23, 1:25 PM

## 2023-12-19 DIAGNOSIS — N179 Acute kidney failure, unspecified: Secondary | ICD-10-CM | POA: Diagnosis not present

## 2023-12-19 DIAGNOSIS — N189 Chronic kidney disease, unspecified: Secondary | ICD-10-CM | POA: Diagnosis not present

## 2023-12-19 LAB — BASIC METABOLIC PANEL
Anion gap: 6 (ref 5–15)
BUN: 41 mg/dL — ABNORMAL HIGH (ref 8–23)
CO2: 25 mmol/L (ref 22–32)
Calcium: 8.6 mg/dL — ABNORMAL LOW (ref 8.9–10.3)
Chloride: 111 mmol/L (ref 98–111)
Creatinine, Ser: 2.41 mg/dL — ABNORMAL HIGH (ref 0.61–1.24)
GFR, Estimated: 29 mL/min — ABNORMAL LOW (ref >60)
Glucose, Bld: 138 mg/dL — ABNORMAL HIGH (ref 70–99)
Potassium: 4.6 mmol/L (ref 3.5–5.1)
Sodium: 142 mmol/L (ref 135–145)

## 2023-12-19 LAB — CBC
HCT: 28.6 % — ABNORMAL LOW (ref 39.0–52.0)
Hemoglobin: 9.5 g/dL — ABNORMAL LOW (ref 13.0–17.0)
MCH: 33 pg (ref 26.0–34.0)
MCHC: 33.2 g/dL (ref 30.0–36.0)
MCV: 99.3 fL (ref 80.0–100.0)
Platelets: 95 10*3/uL — ABNORMAL LOW (ref 150–400)
RBC: 2.88 MIL/uL — ABNORMAL LOW (ref 4.22–5.81)
RDW: 13.7 % (ref 11.5–15.5)
WBC: 4.6 10*3/uL (ref 4.0–10.5)
nRBC: 0 % (ref 0.0–0.2)

## 2023-12-19 LAB — AMMONIA: Ammonia: 53 umol/L — ABNORMAL HIGH (ref 9–35)

## 2023-12-19 MED ORDER — SODIUM CHLORIDE 0.9 % IV SOLN: INTRAVENOUS | Status: AC

## 2023-12-19 MED ORDER — ORAL CARE MOUTH RINSE: 15.0000 mL | OROMUCOSAL | Status: DC | PRN

## 2023-12-19 NOTE — Plan of Care (Signed)
   Problem: Education: Goal: Knowledge of General Education information will improve Description: Including pain rating scale, medication(s)/side effects and non-pharmacologic comfort measures Outcome: Progressing   Problem: Activity: Goal: Risk for activity intolerance will decrease Outcome: Progressing   Problem: Nutrition: Goal: Adequate nutrition will be maintained Outcome: Progressing   Problem: Coping: Goal: Level of anxiety will decrease Outcome: Progressing   Problem: Elimination: Goal: Will not experience complications related to bowel motility Outcome: Progressing   Problem: Safety: Goal: Ability to remain free from injury will improve Outcome: Progressing

## 2023-12-19 NOTE — TOC Progression Note (Signed)
 Transition of Care [facility]) - Progression Note    Patient Details  Name: Albert Figueroa MRN: [id number] Date of Birth: Feb 11, 1956  [facility]) CM/SW Contact  Marlowe Sax, RN Phone Number: 12/19/2023, 2:51 PM  Clinical Narrative:    Howie Ill at [facility] to see if they can offer a bed for STR, awaiting a responce   Expected Discharge Plan: Skilled Nursing Facility Barriers to Discharge: SNF Pending bed offer, Insurance Authorization  Expected Discharge Plan and Services   Discharge Planning Services: CM Consult   Living arrangements for the past 2 months: Single Family Home                   DME Agency: NA       HH Arranged: NA           Social Determinants of Health (SDOH) Interventions SDOH Screenings   Food Insecurity: Patient Unable To Answer (12/10/2023)  Recent Concern: Food Insecurity - Food Insecurity Present (10/06/2023)   Received from [facility]  Housing: Patient Unable To Answer (12/10/2023)  Transportation Needs: Patient Unable To Answer (12/10/2023)  Utilities: Patient Unable To Answer (12/10/2023)  Financial Resource Strain: High Risk (10/06/2023)   [facility]  Social Connections: Unknown (12/10/2023)  Tobacco Use: High Risk (12/09/2023)    Readmission Risk Interventions    03/06/2023    3:23 PM  Readmission Risk Prevention Plan  Transportation Screening Complete  PCP or Specialist Appt within 3-5 Days Complete  HRI or Home Care Consult --  Social Work Consult for Recovery Care Planning/Counseling Complete  Palliative Care Screening Not Applicable  Medication Review Oceanographer) Complete

## 2023-12-19 NOTE — TOC Progression Note (Signed)
 Transition of Care [facility]) - Progression Note    Patient Details  Name: Albert Figueroa MRN: [id number] Date of Birth: 1956-03-11  Transition of Care [facility]) CM/SW Contact  Marlowe Sax, RN Phone Number: 12/19/2023, 4:47 PM  Clinical Narrative:     Reached out to Darrian at [facility], they will not be able to offer a bed, called  Katie his daughter and explained that there are not any additional bed offers except for [facility] and [place],  She stated that she guessed that they will accept the bed offer at [facility], will need Ins approval as well  I notified [facility]  Expected Discharge Plan: Skilled Nursing Facility Barriers to Discharge: SNF Pending bed offer, Insurance Authorization  Expected Discharge Plan and Services   Discharge Planning Services: CM Consult   Living arrangements for the past 2 months: Single Family Home                   DME Agency: NA       HH Arranged: NA           Social Determinants of Health (SDOH) Interventions SDOH Screenings   Food Insecurity: Patient Unable To Answer (12/10/2023)  Recent Concern: Food Insecurity - Food Insecurity Present (10/06/2023)   Received from [facility]  Housing: Patient Unable To Answer (12/10/2023)  Transportation Needs: Patient Unable To Answer (12/10/2023)  Utilities: Patient Unable To Answer (12/10/2023)  Financial Resource Strain: High Risk (10/06/2023)   Received from [facility]  Social Connections: Unknown (12/10/2023)  Tobacco Use: High Risk (12/09/2023)    Readmission Risk Interventions    03/06/2023    3:23 PM  Readmission Risk Prevention Plan  Transportation Screening Complete  PCP or Specialist Appt within 3-5 Days Complete  HRI or Home Care Consult --  Social Work Consult for Recovery Care Planning/Counseling Complete  Palliative Care Screening Not Applicable  Medication Review Oceanographer) Complete

## 2023-12-19 NOTE — Progress Notes (Signed)
 PROGRESS NOTE    Albert Figueroa  YNW:[id number] DOB: 07/30/1956 DOA: 12/09/2023 PCP: Marisue Ivan, MD   Assessment & Plan:   Principal Problem:   Acute kidney injury superimposed on chronic kidney disease (HCC) Active Problems:   Acute hepatic encephalopathy (HCC)   Acute metabolic encephalopathy   Essential hypertension   Dyslipidemia   Hypernatremia   Type 2 diabetes mellitus with chronic kidney disease, without long-term current use of insulin (HCC)   Dementia with behavioral disturbance (HCC)  Assessment and Plan: AKI on CKDIV: Cr is trending up again today. Started on IVFs. If Cr continues to trend up will consult nephro    Acute hepatic encephalopathy: ammonia level is elevated still. Continue w/ lactulose    Bicytopenia:  WBC is WNL. No need for a transfusion currently    Acute metabolic encephalopathy: continue w/ supportive care    Hypernatremia: resolved  HLD: continue on statin, fenofibrate    HTN: holding home dose of lisinopril as BP is WNL & AKI on CKD    Dementia: with behavioral disturbance. Continue on donepezil   DM2: likely well controlled, HbA1c 5.0 in 08/2022. No need for accuchecks currently. Holding home dose of farxiga secondary to AKI       DVT prophylaxis: SCDs Code Status: full  Family Communication:  Disposition Plan: likely d/c to SNF   Status is: Inpatient Remains inpatient appropriate because: severity of illness. Still waiting on SNF placement    Level of care: Telemetry Medical Consultants:    Procedures:   Antimicrobials:    Subjective: Pt c/o fatigue   Objective: Vitals:   12/18/23 0857 12/18/23 1635 12/18/23 2118 12/19/23 0458  BP: (!) 113/93 99/75 115/69 109/63  Pulse: 88 78 65 73  Resp: 16 16 19 16   Temp: 97.9 F (36.6 C) 97.8 F (36.6 C) 98.9 F (37.2 C) 98.4 F (36.9 C)  TempSrc:  Oral    SpO2: 100% 91% 100% 99%  Weight:      Height:        Intake/Output Summary (Last 24 hours) at 12/19/2023  0843 Last data filed at 12/18/2023 2212 Gross per 24 hour  Intake 240 ml  Output 200 ml  Net 40 ml   Filed Weights   12/09/23 1918  Weight: 90.7 kg    Examination:  General exam: Appears calm & comfortable  Respiratory system: clear breath sounds b/l  Cardiovascular system: S1 & S2+. No rubs or gallops Gastrointestinal system: abd is soft, NT, ND & hypoactive bowel sounds  Central nervous system: AA&Ox3. Moves all extremities  Psychiatry: judgement and insight appears at baseline. Flat mood and affect     Data Reviewed: I have personally reviewed following labs and imaging studies  CBC: Recent Labs  Lab 12/13/23 0315 12/14/23 0620 12/15/23 0247 12/16/23 0424 12/17/23 0142 12/18/23 0457 12/19/23 0351  WBC 2.6* 2.9* 2.7* 3.8* 3.9* 4.7 4.6  NEUTROABS 1.1* 1.3* 1.2* 2.1 2.0  --   --   HGB 9.2* 9.2* 9.5* 9.1* 9.9* 9.6* 9.5*  HCT 28.7* 28.9* 30.3* 28.2* 30.5* 29.4* 28.6*  MCV 104.0* 102.8* 106.3* 103.3* 103.7* 103.5* 99.3  PLT 66* 50* 41* 45* 50* 68* 95*   Basic Metabolic Panel: Recent Labs  Lab 12/15/23 0247 12/16/23 0424 12/17/23 0142 12/18/23 0457 12/19/23 0351  NA 146* 142 144 141 142  K 4.5 3.7 4.2 4.0 4.6  CL 117* 112* 117* 110 111  CO2 22 23 24 23 25   GLUCOSE 113* 110* 108* 139* 138*  BUN  27* 26* 31* 39* 41*  CREATININE 1.96* 1.89* 2.13* 2.31* 2.41*  CALCIUM 9.0 9.2 8.9 8.7* 8.6*   GFR: Estimated Creatinine Clearance: 33.1 mL/min (A) (by C-G formula based on SCr of 2.41 mg/dL (H)). Liver Function Tests: No results for input(s): "AST", "ALT", "ALKPHOS", "BILITOT", "PROT", "ALBUMIN" in the last 168 hours. No results for input(s): "LIPASE", "AMYLASE" in the last 168 hours. Recent Labs  Lab 12/14/23 0620 12/15/23 0247 12/16/23 0424 12/18/23 0457 12/19/23 0351  AMMONIA 70* 79* 56* 42* 53*   Coagulation Profile: No results for input(s): "INR", "PROTIME" in the last 168 hours. Cardiac Enzymes: No results for input(s): "CKTOTAL", "CKMB",  "CKMBINDEX", "TROPONINI" in the last 168 hours. BNP (last 3 results) No results for input(s): "PROBNP" in the last 8760 hours. HbA1C: No results for input(s): "HGBA1C" in the last 72 hours. CBG: No results for input(s): "GLUCAP" in the last 168 hours. Lipid Profile: No results for input(s): "CHOL", "HDL", "LDLCALC", "TRIG", "CHOLHDL", "LDLDIRECT" in the last 72 hours. Thyroid Function Tests: No results for input(s): "TSH", "T4TOTAL", "FREET4", "T3FREE", "THYROIDAB" in the last 72 hours. Anemia Panel: No results for input(s): "VITAMINB12", "FOLATE", "FERRITIN", "TIBC", "IRON", "RETICCTPCT" in the last 72 hours.  Sepsis Labs: No results for input(s): "PROCALCITON", "LATICACIDVEN" in the last 168 hours.  No results found for this or any previous visit (from the past 240 hours).       Radiology Studies: No results found.      Scheduled Meds:  divalproex  1,000 mg Oral Q12H   donepezil  10 mg Oral QHS   fenofibrate  160 mg Oral Daily   fluticasone furoate-vilanterol  1 puff Inhalation Daily   And   umeclidinium bromide  1 puff Inhalation Daily   haloperidol lactate  5 mg Intramuscular NOW   lactulose  30 g Oral TID   pantoprazole  40 mg Oral Daily   rosuvastatin  20 mg Oral QHS   Continuous Infusions:   LOS: 10 days      Charise Killian, MD [facility] Pager [phone]  If 7PM-7AM, please contact night-coverage [email or web address] 12/19/2023, 8:43 AM

## 2023-12-19 NOTE — Plan of Care (Signed)

## 2023-12-20 DIAGNOSIS — N179 Acute kidney failure, unspecified: Secondary | ICD-10-CM | POA: Diagnosis not present

## 2023-12-20 DIAGNOSIS — N189 Chronic kidney disease, unspecified: Secondary | ICD-10-CM | POA: Diagnosis not present

## 2023-12-20 LAB — CBC
HCT: 27 % — ABNORMAL LOW (ref 39.0–52.0)
Hemoglobin: 8.9 g/dL — ABNORMAL LOW (ref 13.0–17.0)
MCH: 33.6 pg (ref 26.0–34.0)
MCHC: 33 g/dL (ref 30.0–36.0)
MCV: 101.9 fL — ABNORMAL HIGH (ref 80.0–100.0)
Platelets: 125 10*3/uL — ABNORMAL LOW (ref 150–400)
RBC: 2.65 MIL/uL — ABNORMAL LOW (ref 4.22–5.81)
RDW: 13.9 % (ref 11.5–15.5)
WBC: 4.4 10*3/uL (ref 4.0–10.5)
nRBC: 0 % (ref 0.0–0.2)

## 2023-12-20 LAB — BASIC METABOLIC PANEL
Anion gap: 7 (ref 5–15)
BUN: 33 mg/dL — ABNORMAL HIGH (ref 8–23)
CO2: 20 mmol/L — ABNORMAL LOW (ref 22–32)
Calcium: 8.5 mg/dL — ABNORMAL LOW (ref 8.9–10.3)
Chloride: 113 mmol/L — ABNORMAL HIGH (ref 98–111)
Creatinine, Ser: 2.15 mg/dL — ABNORMAL HIGH (ref 0.61–1.24)
GFR, Estimated: 33 mL/min — ABNORMAL LOW (ref >60)
Glucose, Bld: 111 mg/dL — ABNORMAL HIGH (ref 70–99)
Potassium: 4.2 mmol/L (ref 3.5–5.1)
Sodium: 140 mmol/L (ref 135–145)

## 2023-12-20 LAB — AMMONIA: Ammonia: 60 umol/L — ABNORMAL HIGH (ref 9–35)

## 2023-12-20 NOTE — Progress Notes (Signed)
 PROGRESS NOTE    Albert Figueroa  UJW:[id number] DOB: [birth year] DOA: 12/09/2023 PCP: Marisue Ivan, MD   Assessment & Plan:   Principal Problem:   Acute kidney injury superimposed on chronic kidney disease (HCC) Active Problems:   Acute hepatic encephalopathy (HCC)   Acute metabolic encephalopathy   Essential hypertension   Dyslipidemia   Hypernatremia   Type 2 diabetes mellitus with chronic kidney disease, without long-term current use of insulin (HCC)   Dementia with behavioral disturbance (HCC)  Assessment and Plan: AKI on CKDIV: Cr is trending down today. Avoid nephrotoxic meds    Acute hepatic encephalopathy: ammonia level is still elevated. Continue on lactulose    Bicytopenia:  WBC is WNL. No need for a transfusion currently    Acute metabolic encephalopathy: continue w/ supportive care    Hypernatremia: resolved  HLD: continue on fenofibrate, statin    HTN: continue holding lisinopril secondary to AKI on CKD   Dementia: with behavioral disturbance. Continue on donepezil   DM2: likely well controlled, HbA1c 5.0 in 08/2022. No need for accuchecks currently. Holding home dose of farxiga secondary to AKI       DVT prophylaxis: SCDs Code Status: full  Family Communication:  Disposition Plan: likely d/c to SNF   Status is: Inpatient Remains inpatient appropriate because: severity of illness. Awaiting on insurance auth as per CM     Level of care: Telemetry Medical Consultants:    Procedures:   Antimicrobials:    Subjective: Pt c/o malaise  Objective: Vitals:   12/19/23 1533 12/19/23 2255 12/20/23 0254 12/20/23 0800  BP: 118/78 110/71 110/65 122/73  Pulse: 69 72 71 64  Resp: 16 18 16 18   Temp: 98 F (36.7 C) 98.4 F (36.9 C) 98.3 F (36.8 C) 98.7 F (37.1 C)  TempSrc:   Oral   SpO2: 100% 100% 100% 99%  Weight:      Height:        Intake/Output Summary (Last 24 hours) at 12/20/2023 0838 Last data filed at 12/20/2023 0506 Gross per  24 hour  Intake 1460.81 ml  Output 680 ml  Net 780.81 ml   Filed Weights   12/09/23 1918  Weight: 90.7 kg    Examination:  General exam: Appears comfortable  Respiratory system: clear breath sounds b/l Cardiovascular system: S1/S2+. No rubs or gallops  Gastrointestinal system: abd is soft, NT, ND & hypoactive bowel sounds Central nervous system: AA&Ox3. Moves all extremities   Psychiatry:  judgement and insight appears at baseline. Appropriate mood and affect     Data Reviewed: I have personally reviewed following labs and imaging studies  CBC: Recent Labs  Lab 12/14/23 0620 12/15/23 0247 12/16/23 0424 12/17/23 0142 12/18/23 0457 12/19/23 0351 12/20/23 0609  WBC 2.9* 2.7* 3.8* 3.9* 4.7 4.6 4.4  NEUTROABS 1.3* 1.2* 2.1 2.0  --   --   --   HGB 9.2* 9.5* 9.1* 9.9* 9.6* 9.5* 8.9*  HCT 28.9* 30.3* 28.2* 30.5* 29.4* 28.6* 27.0*  MCV 102.8* 106.3* 103.3* 103.7* 103.5* 99.3 101.9*  PLT 50* 41* 45* 50* 68* 95* 125*   Basic Metabolic Panel: Recent Labs  Lab 12/16/23 0424 12/17/23 0142 12/18/23 0457 12/19/23 0351 12/20/23 0609  NA 142 144 141 142 140  K 3.7 4.2 4.0 4.6 4.2  CL 112* 117* 110 111 113*  CO2 23 24 23 25  20*  GLUCOSE 110* 108* 139* 138* 111*  BUN 26* 31* 39* 41* 33*  CREATININE 1.89* 2.13* 2.31* 2.41* 2.15*  CALCIUM 9.2 8.9  8.7* 8.6* 8.5*   GFR: Estimated Creatinine Clearance: 37.1 mL/min (A) (by C-G formula based on SCr of 2.15 mg/dL (H)). Liver Function Tests: No results for input(s): "AST", "ALT", "ALKPHOS", "BILITOT", "PROT", "ALBUMIN" in the last 168 hours. No results for input(s): "LIPASE", "AMYLASE" in the last 168 hours. Recent Labs  Lab 12/15/23 0247 12/16/23 0424 12/18/23 0457 12/19/23 0351 12/20/23 0609  AMMONIA 79* 56* 42* 53* 60*   Coagulation Profile: No results for input(s): "INR", "PROTIME" in the last 168 hours. Cardiac Enzymes: No results for input(s): "CKTOTAL", "CKMB", "CKMBINDEX", "TROPONINI" in the last 168 hours. BNP  (last 3 results) No results for input(s): "PROBNP" in the last 8760 hours. HbA1C: No results for input(s): "HGBA1C" in the last 72 hours. CBG: No results for input(s): "GLUCAP" in the last 168 hours. Lipid Profile: No results for input(s): "CHOL", "HDL", "LDLCALC", "TRIG", "CHOLHDL", "LDLDIRECT" in the last 72 hours. Thyroid Function Tests: No results for input(s): "TSH", "T4TOTAL", "FREET4", "T3FREE", "THYROIDAB" in the last 72 hours. Anemia Panel: No results for input(s): "VITAMINB12", "FOLATE", "FERRITIN", "TIBC", "IRON", "RETICCTPCT" in the last 72 hours.  Sepsis Labs: No results for input(s): "PROCALCITON", "LATICACIDVEN" in the last 168 hours.  No results found for this or any previous visit (from the past 240 hours).       Radiology Studies: No results found.      Scheduled Meds:  divalproex  1,000 mg Oral Q12H   donepezil  10 mg Oral QHS   fenofibrate  160 mg Oral Daily   fluticasone furoate-vilanterol  1 puff Inhalation Daily   And   umeclidinium bromide  1 puff Inhalation Daily   lactulose  30 g Oral TID   pantoprazole  40 mg Oral Daily   rosuvastatin  20 mg Oral QHS   Continuous Infusions:  sodium chloride 75 mL/hr at 12/20/23 0506     LOS: 11 days      Charise Killian, MD [facility] Pager [phone]  If 7PM-7AM, please contact night-coverage [email or web address] 12/20/2023, 8:38 AM

## 2023-12-20 NOTE — Plan of Care (Signed)

## 2023-12-21 DIAGNOSIS — N189 Chronic kidney disease, unspecified: Secondary | ICD-10-CM | POA: Diagnosis not present

## 2023-12-21 DIAGNOSIS — N179 Acute kidney failure, unspecified: Secondary | ICD-10-CM | POA: Diagnosis not present

## 2023-12-21 LAB — AMMONIA: Ammonia: 49 umol/L — ABNORMAL HIGH (ref 9–35)

## 2023-12-21 LAB — BASIC METABOLIC PANEL
Anion gap: 6 (ref 5–15)
BUN: 31 mg/dL — ABNORMAL HIGH (ref 8–23)
CO2: 20 mmol/L — ABNORMAL LOW (ref 22–32)
Calcium: 8.4 mg/dL — ABNORMAL LOW (ref 8.9–10.3)
Chloride: 111 mmol/L (ref 98–111)
Creatinine, Ser: 2.04 mg/dL — ABNORMAL HIGH (ref 0.61–1.24)
GFR, Estimated: 35 mL/min — ABNORMAL LOW (ref >60)
Glucose, Bld: 94 mg/dL (ref 70–99)
Potassium: 4.2 mmol/L (ref 3.5–5.1)
Sodium: 137 mmol/L (ref 135–145)

## 2023-12-21 LAB — CBC
HCT: 28.1 % — ABNORMAL LOW (ref 39.0–52.0)
Hemoglobin: 9.3 g/dL — ABNORMAL LOW (ref 13.0–17.0)
MCH: 33.3 pg (ref 26.0–34.0)
MCHC: 33.1 g/dL (ref 30.0–36.0)
MCV: 100.7 fL — ABNORMAL HIGH (ref 80.0–100.0)
Platelets: 144 10*3/uL — ABNORMAL LOW (ref 150–400)
RBC: 2.79 MIL/uL — ABNORMAL LOW (ref 4.22–5.81)
RDW: 13.9 % (ref 11.5–15.5)
WBC: 5.4 10*3/uL (ref 4.0–10.5)
nRBC: 0 % (ref 0.0–0.2)

## 2023-12-21 NOTE — Progress Notes (Signed)
 PROGRESS NOTE    Albert Figueroa  AOZ:[id number] DOB: 1956-03-10 DOA: 12/09/2023 PCP: Marisue Ivan, MD   Assessment & Plan:   Principal Problem:   Acute kidney injury superimposed on chronic kidney disease (HCC) Active Problems:   Acute hepatic encephalopathy (HCC)   Acute metabolic encephalopathy   Essential hypertension   Dyslipidemia   Hypernatremia   Type 2 diabetes mellitus with chronic kidney disease, without long-term current use of insulin (HCC)   Dementia with behavioral disturbance (HCC)  Assessment and Plan: AKI on CKDIV: Cr is trending down again today. Avoid nephrotoxic meds    Acute hepatic encephalopathy: ammonia level is labile. Continue on lactulose   Bicytopenia: WBC is WNL. No need for a transfusion currently    Acute metabolic encephalopathy: continue w/ supportive care    Hypernatremia: resolved  HLD: continue on statin, fenofibrate    HTN: continue holding lisinopril secondary to AKI on CKD     Dementia: with behavioral disturbance. Continue on donepezil  DM2: likely well controlled, HbA1c 5.0 in 08/2022. No need for accuchecks currently. Holding home dose of farxiga secondary to AKI       DVT prophylaxis: SCDs Code Status: full  Family Communication:  Disposition Plan: likely d/c to SNF   Status is: Inpatient Remains inpatient appropriate because: severity of illness. Awaiting on insurance auth as per CM     Level of care: Telemetry Medical Consultants:    Procedures:   Antimicrobials:    Subjective: Pt c/o fatigue   Objective: Vitals:   12/20/23 1602 12/20/23 2026 12/21/23 0414 12/21/23 0840  BP: 118/73 134/68 (!) 141/84 129/74  Pulse: 63 71 67 71  Resp: 18 20 18 18   Temp: 97.9 F (36.6 C) 98.8 F (37.1 C) 98.9 F (37.2 C) 98.1 F (36.7 C)  TempSrc:  Oral Oral   SpO2: 99% 100% 100% 100%  Weight:      Height:        Intake/Output Summary (Last 24 hours) at 12/21/2023 0851 Last data filed at 12/21/2023  0618 Gross per 24 hour  Intake --  Output 780 ml  Net -780 ml   Filed Weights   12/09/23 1918  Weight: 90.7 kg    Examination:  General exam: Appears comfortable  Respiratory system: clear breath sounds b/l Cardiovascular system: S1 & S2+. No rubs or gallops Gastrointestinal system: abd is soft, NT, ND & hypoactive bowel sounds  Central nervous system: AA&Ox4.  Psychiatry:  judgement and insight appears at baseline     Data Reviewed: I have personally reviewed following labs and imaging studies  CBC: Recent Labs  Lab 12/15/23 0247 12/16/23 0424 12/17/23 0142 12/18/23 0457 12/19/23 0351 12/20/23 0609 12/21/23 0232  WBC 2.7* 3.8* 3.9* 4.7 4.6 4.4 5.4  NEUTROABS 1.2* 2.1 2.0  --   --   --   --   HGB 9.5* 9.1* 9.9* 9.6* 9.5* 8.9* 9.3*  HCT 30.3* 28.2* 30.5* 29.4* 28.6* 27.0* 28.1*  MCV 106.3* 103.3* 103.7* 103.5* 99.3 101.9* 100.7*  PLT 41* 45* 50* 68* 95* 125* 144*   Basic Metabolic Panel: Recent Labs  Lab 12/17/23 0142 12/18/23 0457 12/19/23 0351 12/20/23 0609 12/21/23 0232  NA 144 141 142 140 137  K 4.2 4.0 4.6 4.2 4.2  CL 117* 110 111 113* 111  CO2 24 23 25  20* 20*  GLUCOSE 108* 139* 138* 111* 94  BUN 31* 39* 41* 33* 31*  CREATININE 2.13* 2.31* 2.41* 2.15* 2.04*  CALCIUM 8.9 8.7* 8.6* 8.5* 8.4*  GFR: Estimated Creatinine Clearance: 39.1 mL/min (A) (by C-G formula based on SCr of 2.04 mg/dL (H)). Liver Function Tests: No results for input(s): "AST", "ALT", "ALKPHOS", "BILITOT", "PROT", "ALBUMIN" in the last 168 hours. No results for input(s): "LIPASE", "AMYLASE" in the last 168 hours. Recent Labs  Lab 12/16/23 0424 12/18/23 0457 12/19/23 0351 12/20/23 0609 12/21/23 0232  AMMONIA 56* 42* 53* 60* 49*   Coagulation Profile: No results for input(s): "INR", "PROTIME" in the last 168 hours. Cardiac Enzymes: No results for input(s): "CKTOTAL", "CKMB", "CKMBINDEX", "TROPONINI" in the last 168 hours. BNP (last 3 results) No results for input(s):  "PROBNP" in the last 8760 hours. HbA1C: No results for input(s): "HGBA1C" in the last 72 hours. CBG: No results for input(s): "GLUCAP" in the last 168 hours. Lipid Profile: No results for input(s): "CHOL", "HDL", "LDLCALC", "TRIG", "CHOLHDL", "LDLDIRECT" in the last 72 hours. Thyroid Function Tests: No results for input(s): "TSH", "T4TOTAL", "FREET4", "T3FREE", "THYROIDAB" in the last 72 hours. Anemia Panel: No results for input(s): "VITAMINB12", "FOLATE", "FERRITIN", "TIBC", "IRON", "RETICCTPCT" in the last 72 hours.  Sepsis Labs: No results for input(s): "PROCALCITON", "LATICACIDVEN" in the last 168 hours.  No results found for this or any previous visit (from the past 240 hours).       Radiology Studies: No results found.      Scheduled Meds:  divalproex  1,000 mg Oral Q12H   donepezil  10 mg Oral QHS   fenofibrate  160 mg Oral Daily   fluticasone furoate-vilanterol  1 puff Inhalation Daily   And   umeclidinium bromide  1 puff Inhalation Daily   lactulose  30 g Oral TID   pantoprazole  40 mg Oral Daily   rosuvastatin  20 mg Oral QHS   Continuous Infusions:     LOS: 12 days      Charise Killian, MD [facility] Pager [phone]  If 7PM-7AM, please contact night-coverage [email or web address] 12/21/2023, 8:51 AM

## 2023-12-21 NOTE — Plan of Care (Signed)
  Problem: Health Behavior/Discharge Planning: Goal: Ability to manage health-related needs will improve Outcome: Not Progressing   Problem: Education: Goal: Knowledge of General Education information will improve Description: Including pain rating scale, medication(s)/side effects and non-pharmacologic comfort measures Outcome: Progressing   Problem: Clinical Measurements: Goal: Ability to maintain clinical measurements within normal limits will improve Outcome: Progressing Goal: Will remain free from infection Outcome: Progressing Goal: Diagnostic test results will improve Outcome: Progressing Goal: Respiratory complications will improve Outcome: Progressing Goal: Cardiovascular complication will be avoided Outcome: Progressing   Problem: Activity: Goal: Risk for activity intolerance will decrease Outcome: Progressing   Problem: Nutrition: Goal: Adequate nutrition will be maintained Outcome: Progressing   Problem: Coping: Goal: Level of anxiety will decrease Outcome: Progressing   Problem: Elimination: Goal: Will not experience complications related to bowel motility Outcome: Progressing Goal: Will not experience complications related to urinary retention Outcome: Progressing   Problem: Pain Managment: Goal: General experience of comfort will improve and/or be controlled Outcome: Progressing   Problem: Safety: Goal: Ability to remain free from injury will improve Outcome: Progressing   Problem: Skin Integrity: Goal: Risk for impaired skin integrity will decrease Outcome: Progressing

## 2023-12-21 NOTE — Plan of Care (Signed)

## 2023-12-21 NOTE — TOC Progression Note (Signed)
 Transition of Care [facility]) - Progression Note    Patient Details  Name: RITHWIK SCHMIEG MRN: [id number] Date of Birth: [birth year]  [facility] of [facility]) CM/SW Contact  Bing Quarry, RN Phone Number: 12/21/2023, 3:05 PM  Clinical Narrative: 3/23: Insurance authorization submitted at 305 pm today via [employer] #[id number] for pending [facility].    Gabriel Cirri MSN RN CM  RN Case Manager [place]  Transitions of Care Direct Dial: [phone] (Weekends Only) [facility] Main Office Phone: [phone] [facility] Fax: [phone] [email or web address]     Expected Discharge Plan: Skilled Nursing Facility Barriers to Discharge: SNF Pending bed offer, Insurance Authorization  Expected Discharge Plan and Services   Discharge Planning Services: CM Consult   Living arrangements for the past 2 months: Single Family Home                   DME Agency: NA       HH Arranged: NA           Social Determinants of Health (SDOH) Interventions SDOH Screenings   Food Insecurity: Patient Unable To Answer (12/10/2023)  Recent Concern: Food Insecurity - Food Insecurity Present (10/06/2023)   Received from [facility]  Housing: Patient Unable To Answer (12/10/2023)  Transportation Needs: Patient Unable To Answer (12/10/2023)  Utilities: Patient Unable To Answer (12/10/2023)  Financial Resource Strain: High Risk (10/06/2023)   Received from [facility]  Social Connections: Unknown (12/10/2023)  Tobacco Use: High Risk (12/09/2023)    Readmission Risk Interventions    03/06/2023    3:23 PM  Readmission Risk Prevention Plan  Transportation Screening Complete  PCP or Specialist Appt within 3-5 Days Complete  HRI or Home Care Consult --  Social Work Consult for Recovery Care Planning/Counseling Complete  Palliative Care Screening Not Applicable  Medication Review Oceanographer) Complete

## 2023-12-22 DIAGNOSIS — N189 Chronic kidney disease, unspecified: Secondary | ICD-10-CM | POA: Diagnosis not present

## 2023-12-22 DIAGNOSIS — N179 Acute kidney failure, unspecified: Secondary | ICD-10-CM | POA: Diagnosis not present

## 2023-12-22 LAB — CBC
HCT: 28 % — ABNORMAL LOW (ref 39.0–52.0)
Hemoglobin: 9.2 g/dL — ABNORMAL LOW (ref 13.0–17.0)
MCH: 33.3 pg (ref 26.0–34.0)
MCHC: 32.9 g/dL (ref 30.0–36.0)
MCV: 101.4 fL — ABNORMAL HIGH (ref 80.0–100.0)
Platelets: 168 10*3/uL (ref 150–400)
RBC: 2.76 MIL/uL — ABNORMAL LOW (ref 4.22–5.81)
RDW: 14 % (ref 11.5–15.5)
WBC: 4.4 10*3/uL (ref 4.0–10.5)
nRBC: 0 % (ref 0.0–0.2)

## 2023-12-22 LAB — BASIC METABOLIC PANEL
Anion gap: 8 (ref 5–15)
BUN: 32 mg/dL — ABNORMAL HIGH (ref 8–23)
CO2: 20 mmol/L — ABNORMAL LOW (ref 22–32)
Calcium: 8.7 mg/dL — ABNORMAL LOW (ref 8.9–10.3)
Chloride: 111 mmol/L (ref 98–111)
Creatinine, Ser: 2.22 mg/dL — ABNORMAL HIGH (ref 0.61–1.24)
GFR, Estimated: 32 mL/min — ABNORMAL LOW (ref >60)
Glucose, Bld: 88 mg/dL (ref 70–99)
Potassium: 4.1 mmol/L (ref 3.5–5.1)
Sodium: 139 mmol/L (ref 135–145)

## 2023-12-22 LAB — AMMONIA: Ammonia: 47 umol/L — ABNORMAL HIGH (ref 9–35)

## 2023-12-22 MED ORDER — ENOXAPARIN SODIUM 40 MG/0.4ML IJ SOSY
40.0000 mg | PREFILLED_SYRINGE | INTRAMUSCULAR | Status: DC
  Administered 2023-12-22 – 2023-12-24 (×3): 40 mg via SUBCUTANEOUS
  Filled 2023-12-22 (×3): qty 0.4

## 2023-12-22 NOTE — Progress Notes (Signed)
 PROGRESS NOTE    Albert Figueroa  ZOX:[id number] DOB: 08/08/1956 DOA: 12/09/2023 PCP: Marisue Ivan, MD   Assessment & Plan:   Principal Problem:   Acute kidney injury superimposed on chronic kidney disease (HCC) Active Problems:   Acute hepatic encephalopathy (HCC)   Acute metabolic encephalopathy   Essential hypertension   Dyslipidemia   Hypernatremia   Type 2 diabetes mellitus with chronic kidney disease, without long-term current use of insulin (HCC)   Dementia with behavioral disturbance (HCC)  Assessment and Plan: AKI on CKDIV: Cr is labile. Avoid nephrotoxic meds. Encourage water intake    Acute hepatic encephalopathy: ammonia level is labile. Continue on lactulose    Macrocytic anemia: H&H are stable. No need for a transfusion currently. WBC & platelets are both back WNL    Acute metabolic encephalopathy: continue w/ supportive care   Hypernatremia: resolved  HLD: continue on fenofibrate, statin    HTN: holding lisinopril secondary to AKI on CKD   Dementia: with behavioral disturbance. Continue w/ donepezil   DM2: likely well controlled, HbA1c 5.0 in 08/2022. No need for accuchecks currently. Holding home dose of farxiga secondary to AKI       DVT prophylaxis: lovenox  Code Status: full  Family Communication:  Disposition Plan: likely d/c to SNF   Status is: Inpatient Remains inpatient appropriate because: medically stable. Awaiting on insurance auth as per CM     Level of care: Telemetry Medical Consultants:    Procedures:   Antimicrobials:    Subjective: Pt c/o malaise  Objective: Vitals:   12/21/23 1638 12/21/23 2012 12/22/23 0326 12/22/23 0831  BP: 107/76 122/66 118/68 (!) 113/53  Pulse: 71 66 64 63  Resp: 16   18  Temp: (!) 97.4 F (36.3 C) 98.2 F (36.8 C) 98.5 F (36.9 C) 98.2 F (36.8 C)  TempSrc:  Oral Oral   SpO2: 100% 100% 99% 96%  Weight:      Height:        Intake/Output Summary (Last 24 hours) at 12/22/2023  0847 Last data filed at 12/22/2023 0830 Gross per 24 hour  Intake 480 ml  Output 625 ml  Net -145 ml   Filed Weights   12/09/23 1918  Weight: 90.7 kg    Examination:  General exam: Appears calm & comfortable  Respiratory system: clear breath sounds b/l  Cardiovascular system: S1/S2+. No rubs or gallops  Gastrointestinal system: abd is soft, NT, ND & hypoactive bowel sounds  Central nervous system: AA&Ox4. Moves all extremities  Psychiatry:  judgement and insight appears at baseline. Flat mood and affect    Data Reviewed: I have personally reviewed following labs and imaging studies  CBC: Recent Labs  Lab 12/16/23 0424 12/17/23 0142 12/18/23 0457 12/19/23 0351 12/20/23 0609 12/21/23 0232 12/22/23 0542  WBC 3.8* 3.9* 4.7 4.6 4.4 5.4 4.4  NEUTROABS 2.1 2.0  --   --   --   --   --   HGB 9.1* 9.9* 9.6* 9.5* 8.9* 9.3* 9.2*  HCT 28.2* 30.5* 29.4* 28.6* 27.0* 28.1* 28.0*  MCV 103.3* 103.7* 103.5* 99.3 101.9* 100.7* 101.4*  PLT 45* 50* 68* 95* 125* 144* 168   Basic Metabolic Panel: Recent Labs  Lab 12/18/23 0457 12/19/23 0351 12/20/23 0609 12/21/23 0232 12/22/23 0542  NA 141 142 140 137 139  K 4.0 4.6 4.2 4.2 4.1  CL 110 111 113* 111 111  CO2 23 25 20* 20* 20*  GLUCOSE 139* 138* 111* 94 88  BUN 39* 41* 33*  31* 32*  CREATININE 2.31* 2.41* 2.15* 2.04* 2.22*  CALCIUM 8.7* 8.6* 8.5* 8.4* 8.7*   GFR: Estimated Creatinine Clearance: 35.9 mL/min (A) (by C-G formula based on SCr of 2.22 mg/dL (H)). Liver Function Tests: No results for input(s): "AST", "ALT", "ALKPHOS", "BILITOT", "PROT", "ALBUMIN" in the last 168 hours. No results for input(s): "LIPASE", "AMYLASE" in the last 168 hours. Recent Labs  Lab 12/18/23 0457 12/19/23 0351 12/20/23 0609 12/21/23 0232 12/22/23 0542  AMMONIA 42* 53* 60* 49* 47*   Coagulation Profile: No results for input(s): "INR", "PROTIME" in the last 168 hours. Cardiac Enzymes: No results for input(s): "CKTOTAL", "CKMB", "CKMBINDEX",  "TROPONINI" in the last 168 hours. BNP (last 3 results) No results for input(s): "PROBNP" in the last 8760 hours. HbA1C: No results for input(s): "HGBA1C" in the last 72 hours. CBG: No results for input(s): "GLUCAP" in the last 168 hours. Lipid Profile: No results for input(s): "CHOL", "HDL", "LDLCALC", "TRIG", "CHOLHDL", "LDLDIRECT" in the last 72 hours. Thyroid Function Tests: No results for input(s): "TSH", "T4TOTAL", "FREET4", "T3FREE", "THYROIDAB" in the last 72 hours. Anemia Panel: No results for input(s): "VITAMINB12", "FOLATE", "FERRITIN", "TIBC", "IRON", "RETICCTPCT" in the last 72 hours.  Sepsis Labs: No results for input(s): "PROCALCITON", "LATICACIDVEN" in the last 168 hours.  No results found for this or any previous visit (from the past 240 hours).       Radiology Studies: No results found.      Scheduled Meds:  divalproex  1,000 mg Oral Q12H   donepezil  10 mg Oral QHS   fenofibrate  160 mg Oral Daily   fluticasone furoate-vilanterol  1 puff Inhalation Daily   And   umeclidinium bromide  1 puff Inhalation Daily   lactulose  30 g Oral TID   pantoprazole  40 mg Oral Daily   rosuvastatin  20 mg Oral QHS   Continuous Infusions:     LOS: 13 days      Charise Killian, MD [facility] Pager [phone]  If 7PM-7AM, please contact night-coverage [email or web address] 12/22/2023, 8:47 AM

## 2023-12-22 NOTE — Plan of Care (Signed)

## 2023-12-22 NOTE — TOC Progression Note (Signed)
 Transition of Care [facility]) - Progression Note    Patient Details  Name: Albert Figueroa MRN: [id number] Date of Birth: [birth year]  [facility] Care [facility]) CM/SW Contact  Marlowe Sax, RN Phone Number: 12/22/2023, 1:54 PM  Clinical Narrative:     [id number] Ins auth pending  Expected Discharge Plan: Skilled Nursing Facility Barriers to Discharge: SNF Pending bed offer, Insurance Authorization  Expected Discharge Plan and Services   Discharge Planning Services: CM Consult   Living arrangements for the past 2 months: Single Family Home                   DME Agency: NA       HH Arranged: NA           Social Determinants of Health (SDOH) Interventions SDOH Screenings   Food Insecurity: Patient Unable To Answer (12/10/2023)  Recent Concern: Food Insecurity - Food Insecurity Present (10/06/2023)   Received from [facility]  Housing: Patient Unable To Answer (12/10/2023)  Transportation Needs: Patient Unable To Answer (12/10/2023)  Utilities: Patient Unable To Answer (12/10/2023)  Financial Resource Strain: High Risk (10/06/2023)   Received from [facility]  Social Connections: Unknown (12/10/2023)  Tobacco Use: High Risk (12/09/2023)    Readmission Risk Interventions    03/06/2023    3:23 PM  Readmission Risk Prevention Plan  Transportation Screening Complete  PCP or Specialist Appt within 3-5 Days Complete  HRI or Home Care Consult --  Social Work Consult for Recovery Care Planning/Counseling Complete  Palliative Care Screening Not Applicable  Medication Review Oceanographer) Complete

## 2023-12-22 NOTE — Progress Notes (Signed)
 PT Cancellation Note  Patient Details Name: Albert Figueroa MRN: [id number] DOB: [birth year]   Cancelled Treatment:    Reason Eval/Treat Not Completed: Other (comment).  Chart reviewed and attempted to see pt.  Pt received lunch and requesting to eat before working with therapy.  Pt politely refused therapy services at this time.  Will re-attempt at later date/time as medically appropriate.   Nolon Bussing, PT, DPT Physical Therapist - [facility]  12/22/23, 3:35 PM

## 2023-12-23 DIAGNOSIS — N179 Acute kidney failure, unspecified: Secondary | ICD-10-CM | POA: Diagnosis not present

## 2023-12-23 DIAGNOSIS — N189 Chronic kidney disease, unspecified: Secondary | ICD-10-CM | POA: Diagnosis not present

## 2023-12-23 LAB — BASIC METABOLIC PANEL
Anion gap: 6 (ref 5–15)
BUN: 32 mg/dL — ABNORMAL HIGH (ref 8–23)
CO2: 22 mmol/L (ref 22–32)
Calcium: 8.7 mg/dL — ABNORMAL LOW (ref 8.9–10.3)
Chloride: 112 mmol/L — ABNORMAL HIGH (ref 98–111)
Creatinine, Ser: 2.3 mg/dL — ABNORMAL HIGH (ref 0.61–1.24)
GFR, Estimated: 31 mL/min — ABNORMAL LOW (ref >60)
Glucose, Bld: 95 mg/dL (ref 70–99)
Potassium: 4.2 mmol/L (ref 3.5–5.1)
Sodium: 140 mmol/L (ref 135–145)

## 2023-12-23 LAB — CBC
HCT: 28.3 % — ABNORMAL LOW (ref 39.0–52.0)
Hemoglobin: 9.4 g/dL — ABNORMAL LOW (ref 13.0–17.0)
MCH: 33.1 pg (ref 26.0–34.0)
MCHC: 33.2 g/dL (ref 30.0–36.0)
MCV: 99.6 fL (ref 80.0–100.0)
Platelets: 157 10*3/uL (ref 150–400)
RBC: 2.84 MIL/uL — ABNORMAL LOW (ref 4.22–5.81)
RDW: 13.7 % (ref 11.5–15.5)
WBC: 3.8 10*3/uL — ABNORMAL LOW (ref 4.0–10.5)
nRBC: 0 % (ref 0.0–0.2)

## 2023-12-23 NOTE — TOC Progression Note (Signed)
 Transition of Care [facility]) - Progression Note    Patient Details  Name: DELORIS MITTAG MRN: [id number] Date of Birth: 19-Jan-1956  Transition of Care [facility]) CM/SW Contact  Marlowe Sax, RN Phone Number: 12/23/2023, 10:19 AM  Clinical Narrative:     [id number] Ins auth pending  Expected Discharge Plan: Skilled Nursing Facility Barriers to Discharge: SNF Pending bed offer, Insurance Authorization  Expected Discharge Plan and Services   Discharge Planning Services: CM Consult   Living arrangements for the past 2 months: Single Family Home                   DME Agency: NA       HH Arranged: NA           Social Determinants of Health (SDOH) Interventions SDOH Screenings   Food Insecurity: Patient Unable To Answer (12/10/2023)  Recent Concern: Food Insecurity - Food Insecurity Present (10/06/2023)   Received from [facility]  Housing: Patient Unable To Answer (12/10/2023)  Transportation Needs: Patient Unable To Answer (12/10/2023)  Utilities: Patient Unable To Answer (12/10/2023)  Financial Resource Strain: High Risk (10/06/2023)   Received from [facility]  Social Connections: Unknown (12/10/2023)  Tobacco Use: High Risk (12/09/2023)    Readmission Risk Interventions    03/06/2023    3:23 PM  Readmission Risk Prevention Plan  Transportation Screening Complete  PCP or Specialist Appt within 3-5 Days Complete  HRI or Home Care Consult --  Social Work Consult for Recovery Care Planning/Counseling Complete  Palliative Care Screening Not Applicable  Medication Review Oceanographer) Complete

## 2023-12-23 NOTE — Progress Notes (Signed)
 PROGRESS NOTE   HPI was taken from Dr. Arville Care: Albert Figueroa is a 68 y.o. Caucasian male with medical history significant for type 2 diabetes mellitus, COPD, hypertension, dyslipidemia, stage III chronic kidney disease, Lewy body dementia seizure disorder and stage IV chronic kidney disease who presented to the emergency room with acute onset of altered mental status with confusion since yesterday as well as generalized weakness over the last couple weeks.  He had a fall yesterday.  He denied any nausea or vomiting or diarrhea.  No chest pain or palpitations.  No fever or chills.  No cough or wheezing or dyspnea.  No bleeding diathesis.  No dysuria, oliguria or hematuria or flank pain.   ED Course: When he came to the ER, BP was 95/56 with otherwise normal vital signs.  BP later on was 111/64.  Labs revealed hyper natremia of 151 and hyperkalemia 119 with a BUN of 68 and creatinine 4.73 above previous levels, albumin 3 and direct bili 0.4.  Ammonia level came back elevated at 62.  High sensitive troponin I was 9 and later 8.  CBC showed anemia with hemoglobin 9.3 hematocrit 30 lower than previous levels of 12.7 and 39.1 on 10/13/2023.  Platelets were 114 compared to 97 then.  UA showed no 150 glucose.  Alcohol level was less than 10. EKG as reviewed by me : EKG showed sinus bradycardia with rate of 57 and T wave version inferiorly. Imaging: Noncontrast head CT scan revealed no acute intracranial abnormality.  Showed right frontal encephalomalacia, right posterior approach shunt catheter with tip in the anterior right lateral ventricle with no hydrocephalus.   The patient was given 1 L bolus of IV lactated ringer and was ordered 20 g of p.o. lactulose.  He will be admitted to medical telemetry bed for further evaluation and management   ARSHDEEP BOLGER  XLK:[id number] DOB: [birth year] DOA: 12/09/2023 PCP: Marisue Ivan, MD   Assessment & Plan:   Principal Problem:   Acute kidney injury superimposed  on chronic kidney disease (HCC) Active Problems:   Acute hepatic encephalopathy (HCC)   Acute metabolic encephalopathy   Essential hypertension   Dyslipidemia   Hypernatremia   Type 2 diabetes mellitus with chronic kidney disease, without long-term current use of insulin (HCC)   Dementia with behavioral disturbance (HCC)  Assessment and Plan: AKI on CKDIV: Cr is labile. Encourage water intake. Avoid nephrotoxic meds    Acute hepatic encephalopathy: ammonia is labile. Continue on lactulose    Bicytopenia: H&H are stable. No need for a transfusion currently. Platelets are WNL    Acute metabolic encephalopathy: continue w/ supportive care. Mental status is at baseline    Hypernatremia: resolved  HLD: continue on statin, fenofibrate    HTN: holding lisinopril secondary to AKI on CKD    Dementia: with behavioral disturbance. Continue on w/ donepezil   DM2: likely well controlled, HbA1c 5.0 in 08/2022. No need for accuchecks currently. Holding home dose of farxiga secondary to AKI       DVT prophylaxis: lovenox  Code Status: full  Family Communication:  Disposition Plan: likely d/c to SNF   Status is: Inpatient Remains inpatient appropriate because: medically stable. Awaiting on insurance auth     Level of care: Telemetry Medical Consultants:    Procedures:   Antimicrobials:    Subjective: Pt c/o fatigue   Objective: Vitals:   12/22/23 1522 12/22/23 2017 12/23/23 0445 12/23/23 0750  BP: 111/70 119/70 115/73 119/72  Pulse: 63 68 69 64  Resp: 18 19 18 18   Temp: 97.8 F (36.6 C) 98.2 F (36.8 C) 98.5 F (36.9 C) 98 F (36.7 C)  TempSrc:  Oral  Oral  SpO2: 100% 100% 100% 100%  Weight:      Height:        Intake/Output Summary (Last 24 hours) at 12/23/2023 0848 Last data filed at 12/23/2023 0454 Gross per 24 hour  Intake 240 ml  Output 125 ml  Net 115 ml   Filed Weights   12/09/23 1918  Weight: 90.7 kg    Examination:  General exam: Appears  frustrated  Respiratory system: clear breath sounds b/l  Cardiovascular system: S1 & S2+. No rubs or gallops Gastrointestinal system: abd is soft, NT, ND & hypoactive bowel sounds Central nervous system: AA&Ox4. Moves all extremities  Psychiatry:  judgement and insight appears at baseline. Frustrated mood and affect    Data Reviewed: I have personally reviewed following labs and imaging studies  CBC: Recent Labs  Lab 12/17/23 0142 12/18/23 0457 12/19/23 0351 12/20/23 0609 12/21/23 0232 12/22/23 0542 12/23/23 0457  WBC 3.9*   < > 4.6 4.4 5.4 4.4 3.8*  NEUTROABS 2.0  --   --   --   --   --   --   HGB 9.9*   < > 9.5* 8.9* 9.3* 9.2* 9.4*  HCT 30.5*   < > 28.6* 27.0* 28.1* 28.0* 28.3*  MCV 103.7*   < > 99.3 101.9* 100.7* 101.4* 99.6  PLT 50*   < > 95* 125* 144* 168 157   < > = values in this interval not displayed.   Basic Metabolic Panel: Recent Labs  Lab 12/19/23 0351 12/20/23 0609 12/21/23 0232 12/22/23 0542 12/23/23 0457  NA 142 140 137 139 140  K 4.6 4.2 4.2 4.1 4.2  CL 111 113* 111 111 112*  CO2 25 20* 20* 20* 22  GLUCOSE 138* 111* 94 88 95  BUN 41* 33* 31* 32* 32*  CREATININE 2.41* 2.15* 2.04* 2.22* 2.30*  CALCIUM 8.6* 8.5* 8.4* 8.7* 8.7*   GFR: Estimated Creatinine Clearance: 34.7 mL/min (A) (by C-G formula based on SCr of 2.3 mg/dL (H)). Liver Function Tests: No results for input(s): "AST", "ALT", "ALKPHOS", "BILITOT", "PROT", "ALBUMIN" in the last 168 hours. No results for input(s): "LIPASE", "AMYLASE" in the last 168 hours. Recent Labs  Lab 12/18/23 0457 12/19/23 0351 12/20/23 0609 12/21/23 0232 12/22/23 0542  AMMONIA 42* 53* 60* 49* 47*   Coagulation Profile: No results for input(s): "INR", "PROTIME" in the last 168 hours. Cardiac Enzymes: No results for input(s): "CKTOTAL", "CKMB", "CKMBINDEX", "TROPONINI" in the last 168 hours. BNP (last 3 results) No results for input(s): "PROBNP" in the last 8760 hours. HbA1C: No results for input(s):  "HGBA1C" in the last 72 hours. CBG: No results for input(s): "GLUCAP" in the last 168 hours. Lipid Profile: No results for input(s): "CHOL", "HDL", "LDLCALC", "TRIG", "CHOLHDL", "LDLDIRECT" in the last 72 hours. Thyroid Function Tests: No results for input(s): "TSH", "T4TOTAL", "FREET4", "T3FREE", "THYROIDAB" in the last 72 hours. Anemia Panel: No results for input(s): "VITAMINB12", "FOLATE", "FERRITIN", "TIBC", "IRON", "RETICCTPCT" in the last 72 hours.  Sepsis Labs: No results for input(s): "PROCALCITON", "LATICACIDVEN" in the last 168 hours.  No results found for this or any previous visit (from the past 240 hours).       Radiology Studies: No results found.      Scheduled Meds:  divalproex  1,000 mg Oral Q12H   donepezil  10 mg Oral QHS  enoxaparin (LOVENOX) injection  40 mg Subcutaneous Q24H   fenofibrate  160 mg Oral Daily   fluticasone furoate-vilanterol  1 puff Inhalation Daily   And   umeclidinium bromide  1 puff Inhalation Daily   lactulose  30 g Oral TID   pantoprazole  40 mg Oral Daily   rosuvastatin  20 mg Oral QHS   Continuous Infusions:     LOS: 14 days      Charise Killian, MD [facility] Pager [phone]  If 7PM-7AM, please contact night-coverage [email or web address] 12/23/2023, 8:48 AM

## 2023-12-23 NOTE — Plan of Care (Signed)

## 2023-12-23 NOTE — TOC Progression Note (Signed)
 Transition of Care [facility]) - Progression Note    Patient Details  Name: Albert Figueroa MRN: [id number] Date of Birth: Aug 02, 1956  Transition of Care [facility]) CM/SW Contact  Marlowe Sax, RN Phone Number: 12/23/2023, 4:38 PM  Clinical Narrative:    INS Still pending to go to rehab [facility]   Expected Discharge Plan: Skilled Nursing Facility Barriers to Discharge: SNF Pending bed offer, Insurance Authorization  Expected Discharge Plan and Services   Discharge Planning Services: CM Consult   Living arrangements for the past 2 months: Single Family Home                   DME Agency: NA       HH Arranged: NA           Social Determinants of Health (SDOH) Interventions SDOH Screenings   Food Insecurity: Patient Unable To Answer (12/10/2023)  Recent Concern: Food Insecurity - Food Insecurity Present (10/06/2023)   Received from [facility]  Housing: Patient Unable To Answer (12/10/2023)  Transportation Needs: Patient Unable To Answer (12/10/2023)  Utilities: Patient Unable To Answer (12/10/2023)  Financial Resource Strain: High Risk (10/06/2023)   Received from [facility]  Social Connections: Unknown (12/10/2023)  Tobacco Use: High Risk (12/09/2023)    Readmission Risk Interventions    03/06/2023    3:23 PM  Readmission Risk Prevention Plan  Transportation Screening Complete  PCP or Specialist Appt within 3-5 Days Complete  HRI or Home Care Consult --  Social Work Consult for Recovery Care Planning/Counseling Complete  Palliative Care Screening Not Applicable  Medication Review Oceanographer) Complete

## 2023-12-23 NOTE — TOC Progression Note (Signed)
 Transition of Care [facility]) - Progression Note    Patient Details  Name: Albert Figueroa MRN: [id number] Date of Birth: [birth year]  Transition of Care [facility]) CM/SW Contact  Marlowe Sax, RN Phone Number: 12/23/2023, 1:27 PM  Clinical Narrative:     Met with the patient and called his daughter Florentina Addison while in the room, I explained to both that Ins is pending to go to [facility], The patient stated that he wants to go home now since it is taking so long, I explained to him that he needs more PT and that going to str is in his benefit and to please be patient to wait on Ins approval to go to [facility], he is agreeable , he has his lunch all over his gown and chest, I helped him get cleaned up and put a clean gown on him, I applied his blankets and lowered his head, he stated he is now comfortable and will wait on Ins approval  Expected Discharge Plan: Skilled Nursing Facility Barriers to Discharge: SNF Pending bed offer, Insurance Authorization  Expected Discharge Plan and Services   Discharge Planning Services: CM Consult   Living arrangements for the past 2 months: Single Family Home                   DME Agency: NA       HH Arranged: NA           Social Determinants of Health (SDOH) Interventions SDOH Screenings   Food Insecurity: Patient Unable To Answer (12/10/2023)  Recent Concern: Food Insecurity - Food Insecurity Present (10/06/2023)   Received from [facility]  Housing: Patient Unable To Answer (12/10/2023)  Transportation Needs: Patient Unable To Answer (12/10/2023)  Utilities: Patient Unable To Answer (12/10/2023)  Financial Resource Strain: High Risk (10/06/2023)   Received from [facility]  Social Connections: Unknown (12/10/2023)  Tobacco Use: High Risk (12/09/2023)    Readmission Risk Interventions    03/06/2023    3:23 PM  Readmission Risk Prevention Plan  Transportation Screening Complete  PCP or Specialist Appt within 3-5 Days Complete   HRI or Home Care Consult --  Social Work Consult for Recovery Care Planning/Counseling Complete  Palliative Care Screening Not Applicable  Medication Review Oceanographer) Complete

## 2023-12-23 NOTE — Progress Notes (Signed)
 Physical Therapy Treatment Patient Details Name: Albert Figueroa MRN: [id number] DOB: 1956-04-26 Today's Date: 12/23/2023   History of Present Illness ENES ROKOSZ is a 68 y.o. Caucasian male with medical history significant for type 2 diabetes mellitus, COPD, hypertension, dyslipidemia, stage III chronic kidney disease, Lewy body dementia seizure disorder and stage IV chronic kidney disease who presented to the emergency room 12/09/23 with acute onset of altered mental status with confusion since yesterday as well as generalized weakness over the last couple weeks.  He had a fall 12/08/23.    PT Comments  Pt was pleasant and motivated to participate during the session and put forth good effort throughout. Pt minimally impulsive during the session, most notably with gait.  Pt required min A and cues for sequencing with bed mobility.  With transfers from various height surfaces pt was able to stand without physical assist but needed cues for hand placement due to being unable to stand without BUE assist.  Pt needed frequent multi-modal cuing for general safety with amb including staying within the RW during turns and for amb closer to the RW with upright posture.  Pt will benefit from continued PT services upon discharge to safely address deficits listed in patient problem list for decreased caregiver assistance and eventual return to PLOF.      If plan is discharge home, recommend the following: A little help with walking and/or transfers;A little help with bathing/dressing/bathroom;Assistance with cooking/housework;Direct supervision/assist for medications management;Direct supervision/assist for financial management;Assist for transportation;Help with stairs or ramp for entrance;Supervision due to cognitive status   Can travel by private vehicle     Yes  Equipment Recommendations  Other (comment) (TBD)    Recommendations for Other Services       Precautions / Restrictions  Precautions Precautions: Fall Recall of Precautions/Restrictions: Impaired Restrictions Weight Bearing Restrictions Per Provider Order: No     Mobility  Bed Mobility Overal bed mobility: Needs Assistance Bed Mobility: Supine to Sit     Supine to sit: Min assist     General bed mobility comments: Min A for trunk control with cues for use of bed rail    Transfers Overall transfer level: Needs assistance Equipment used: Rolling walker (2 wheels) Transfers: Sit to/from Stand Sit to Stand: Contact guard assist           General transfer comment: Extra time and effort to come to standing from various surfaces with cues for use of BUEs to assist; pt unable to stand without physical assist without the use of his hands    Ambulation/Gait Ambulation/Gait assistance: Contact guard assist Gait Distance (Feet): 15 Feet Assistive device: Rolling walker (2 wheels) Gait Pattern/deviations: Step-through pattern, Decreased step length - right, Decreased step length - left, Shuffle, Trunk flexed Gait velocity: decreased     General Gait Details: Very slow effortful steps with occasional shuffling, mod multi-modal cues for amb closer to the RW with upright posture, heavy BUE support on the RW   Stairs             Wheelchair Mobility     Tilt Bed    Modified Rankin (Stroke Patients Only)       Balance Overall balance assessment: Needs assistance Sitting-balance support: Feet supported Sitting balance-Leahy Scale: Fair     Standing balance support: Bilateral upper extremity supported, During functional activity, Reliant on assistive device for balance Standing balance-Leahy Scale: Fair  Communication Communication Communication: No apparent difficulties  Cognition Arousal: Alert Behavior During Therapy: WFL for tasks assessed/performed, Impulsive   PT - Cognitive impairments: No family/caregiver present to determine  baseline                         Following commands: Intact Following commands impaired: Follows one step commands with increased time    Cueing Cueing Techniques: Verbal cues, Tactile cues, Visual cues  Exercises Other Exercises Other Exercises: Standing low amplitude mini squats 2 x 5    General Comments        Pertinent Vitals/Pain Pain Assessment Pain Assessment: 0-10 Pain Score: 4  Pain Location: LBP Pain Descriptors / Indicators: Discomfort, Sore Pain Intervention(s): Repositioned, Premedicated before session, Monitored during session    Home Living                          Prior Function            PT Goals (current goals can now be found in the care plan section) Progress towards PT goals: Progressing toward goals    Frequency    Min 2X/week      PT Plan      Co-evaluation              AM-PAC PT "6 Clicks" Mobility   Outcome Measure  Help needed turning from your back to your side while in a flat bed without using bedrails?: A Little Help needed moving from lying on your back to sitting on the side of a flat bed without using bedrails?: A Little Help needed moving to and from a bed to a chair (including a wheelchair)?: A Little Help needed standing up from a chair using your arms (e.g., wheelchair or bedside chair)?: A Little Help needed to walk in hospital room?: A Little Help needed climbing 3-5 steps with a railing? : A Lot 6 Click Score: 17    End of Session Equipment Utilized During Treatment: Gait belt Activity Tolerance: Patient tolerated treatment well Patient left: in chair;with call bell/phone within reach;with chair alarm set Nurse Communication: Mobility status PT Visit Diagnosis: Unsteadiness on feet (R26.81);Other abnormalities of gait and mobility (R26.89);Muscle weakness (generalized) (M62.81);History of falling (Z91.81);Difficulty in walking, not elsewhere classified (R26.2)     Time: 1610-9604 PT  Time Calculation (min) (ACUTE ONLY): 21 min  Charges:    $Gait Training: 8-22 mins PT General Charges $$ ACUTE PT VISIT: 1 Visit                    D. Scott Areon Cocuzza PT, DPT 12/23/23, 2:42 PM

## 2023-12-24 ENCOUNTER — Other Ambulatory Visit: Payer: Self-pay

## 2023-12-24 DIAGNOSIS — N189 Chronic kidney disease, unspecified: Secondary | ICD-10-CM | POA: Diagnosis not present

## 2023-12-24 DIAGNOSIS — F17210 Nicotine dependence, cigarettes, uncomplicated: Secondary | ICD-10-CM

## 2023-12-24 DIAGNOSIS — N179 Acute kidney failure, unspecified: Secondary | ICD-10-CM | POA: Diagnosis not present

## 2023-12-24 DIAGNOSIS — Z122 Encounter for screening for malignant neoplasm of respiratory organs: Secondary | ICD-10-CM

## 2023-12-24 DIAGNOSIS — Z87891 Personal history of nicotine dependence: Secondary | ICD-10-CM

## 2023-12-24 LAB — CBC
HCT: 28.4 % — ABNORMAL LOW (ref 39.0–52.0)
Hemoglobin: 9.3 g/dL — ABNORMAL LOW (ref 13.0–17.0)
MCH: 33.3 pg (ref 26.0–34.0)
MCHC: 32.7 g/dL (ref 30.0–36.0)
MCV: 101.8 fL — ABNORMAL HIGH (ref 80.0–100.0)
Platelets: 145 10*3/uL — ABNORMAL LOW (ref 150–400)
RBC: 2.79 MIL/uL — ABNORMAL LOW (ref 4.22–5.81)
RDW: 13.8 % (ref 11.5–15.5)
WBC: 4.1 10*3/uL (ref 4.0–10.5)
nRBC: 0 % (ref 0.0–0.2)

## 2023-12-24 LAB — BASIC METABOLIC PANEL
Anion gap: 4 — ABNORMAL LOW (ref 5–15)
BUN: 32 mg/dL — ABNORMAL HIGH (ref 8–23)
CO2: 22 mmol/L (ref 22–32)
Calcium: 8.5 mg/dL — ABNORMAL LOW (ref 8.9–10.3)
Chloride: 113 mmol/L — ABNORMAL HIGH (ref 98–111)
Creatinine, Ser: 2.25 mg/dL — ABNORMAL HIGH (ref 0.61–1.24)
GFR, Estimated: 31 mL/min — ABNORMAL LOW (ref >60)
Glucose, Bld: 99 mg/dL (ref 70–99)
Potassium: 4.4 mmol/L (ref 3.5–5.1)
Sodium: 139 mmol/L (ref 135–145)

## 2023-12-24 MED ORDER — LACTULOSE 10 GM/15ML PO SOLN: 30.0000 g | Freq: Three times a day (TID) | ORAL | Status: DC

## 2023-12-24 MED ORDER — TRAZODONE HCL 50 MG PO TABS: 25.0000 mg | ORAL_TABLET | Freq: Every evening | ORAL | Status: DC | PRN

## 2023-12-24 MED ORDER — OXYCODONE HCL 20 MG PO TABS: 1.0000 | ORAL_TABLET | Freq: Every day | ORAL | 0 refills | Status: DC

## 2023-12-24 NOTE — Discharge Summary (Signed)
 Physician Discharge Summary   Patient: Albert Figueroa MRN: [id number] DOB: [birth year]  Admit date:     12/09/2023  Discharge date: 12/24/23  Discharge Physician: Jonah Blue   PCP: Marisue Ivan, MD   Recommendations at discharge:   You are being discharged to [facility] Continue to hold lisinopril Recheck BMP in 3 days Follow up with Dr. Burnadette Pop after discharge from rehab You are being referred for lung cancer screening  Discharge Diagnoses: Principal Problem:   Acute kidney injury superimposed on chronic kidney disease (HCC) Active Problems:   Acute hepatic encephalopathy (HCC)   Acute metabolic encephalopathy   Essential hypertension   Dyslipidemia   Hypernatremia   Type 2 diabetes mellitus with chronic kidney disease, without long-term current use of insulin (HCC)   Dementia with behavioral disturbance [facility])    Hospital Course: [phone] with h/o DM, COPD, HTN, HLD, stage 4 CKD, Lewy body dementia, and seizure disorder who presented on 3/11 with AMS and generalized weakness.  He was found to have AKI and hepatic encephalopathy and has stabilized.  He is awaiting insurance approval for SNF rehab placement.  Assessment and Plan:  Acute hepatic encephalopathy Presented with AMS, NH4 126 on 3/12 NH4 is in the 40s and patient is not altered Continue lactulose  Reports he is a non-drinker  Stage 3B/4 CKD Creatinine is is labile Avoid nephrotoxic meds  Needs outpatient nephrology f/u   Bicytopenia Improved/stable Needs outpatient f/u   Acute metabolic encephalopathy, resolved Mental status is at baseline    HLD Continue statin, fenofibrate    HTN Holding lisinopril secondary to AKI on CKD  BP is stable   Dementia with behavioral disturbance Mild Continue donepezil    DM2 Well controlled, HbA1c 5.0 in 08/2022 No need for accuchecks currently Resume farxiga at time of discharge    Peer to peer completed.  Distance is  low.  He is approved for SNF rehab.    Consultants: Nephrology PT SLP [facility] team  Procedures: None  Antibiotics: None  30 Day Unplanned Readmission Risk Score    Flowsheet Row ED to Hosp-Admission (Current) from 12/09/2023 in [facility] (1A)  30 Day Unplanned Readmission Risk Score (%) 29.28 Filed at 12/24/2023 0801       This score is the patient's risk of an unplanned readmission within 30 days of being discharged (0 -100%). The score is based on dignosis, age, lab data, medications, orders, and past utilization.   Low:  0-14.9   Medium: 15-21.9   High: 22-29.9   Extreme: 30 and above              Pain control - [place] Controlled Substance Reporting System database was reviewed. and patient was instructed, not to drive, operate heavy machinery, perform activities at heights, swimming or participation in water activities or provide baby-sitting services while on Pain, Sleep and Anxiety Medications; until their outpatient Physician has advised to do so again. Also recommended to not to take more than prescribed Pain, Sleep and Anxiety Medications.    Disposition: Skilled nursing facility Diet recommendation:  Normal DISCHARGE MEDICATION: Allergies as of 12/24/2023       Reactions   Gabapentin Other (See Comments)   Lethargic, Confused   Amlodipine    Other reaction(s): Other (see comments) Caused elevated blood pressure and headaches   Ozempic (0.25 Or 0.5 Mg-dose) [semaglutide(0.25 Or 0.5mg -dos)]    Lethargy.  Also dropped blood sugars too low  Medication List     STOP taking these medications    erythromycin ophthalmic ointment   furosemide 20 MG tablet Commonly known as: LASIX   lidocaine 5 % Commonly known as: LIDODERM   lisinopril 20 MG tablet Commonly known as: ZESTRIL       TAKE these medications    acetaminophen 325 MG tablet Commonly known as: TYLENOL Take 2 tablets (650 mg total) by  mouth every 6 (six) hours as needed for mild pain (or Fever >/= 101).   albuterol 108 (90 Base) MCG/ACT inhaler Commonly known as: VENTOLIN HFA Inhale into the lungs every 6 (six) hours as needed for wheezing or shortness of breath.   Dexilant 60 MG capsule Generic drug: dexlansoprazole Take 60 mg by mouth daily.   divalproex 500 MG 24 hr tablet Commonly known as: DEPAKOTE ER Take 1,000 mg by mouth in the morning and at bedtime.   donepezil 10 MG disintegrating tablet Commonly known as: ARICEPT ODT Take 10 mg by mouth at bedtime.   Farxiga 10 MG Tabs tablet Generic drug: dapagliflozin propanediol Take 10 mg by mouth daily.   fenofibrate 160 MG tablet Take 160 mg by mouth daily.   ferrous sulfate 325 (65 FE) MG tablet Take 325 mg by mouth daily with breakfast.   hydrOXYzine 10 MG tablet Commonly known as: ATARAX Take 10 mg by mouth 3 (three) times daily as needed.   lactulose 10 GM/15ML solution Commonly known as: CHRONULAC Take 45 mLs (30 g total) by mouth 3 (three) times daily.   naloxone 4 MG/0.1ML Liqd nasal spray kit Commonly known as: NARCAN Place 1 spray into the nose once.   Oxycodone HCl 20 MG Tabs Take 1 tablet (20 mg total) by mouth 5 (five) times daily.   Roflumilast 250 MCG Tabs Take 1 tablet by mouth daily.   rosuvastatin 20 MG tablet Commonly known as: CRESTOR Take 20 mg by mouth at bedtime.   tiZANidine 4 MG capsule Commonly known as: ZANAFLEX Take 4 mg by mouth at bedtime.   traZODone 50 MG tablet Commonly known as: DESYREL Take 0.5 tablets (25 mg total) by mouth at bedtime as needed for sleep.   Trelegy Ellipta 100-62.5-25 MCG/ACT Aepb Generic drug: Fluticasone-Umeclidin-Vilant Inhale into the lungs daily.   Vitamin D-1000 Max St 25 MCG (1000 UT) tablet Generic drug: Cholecalciferol Take by mouth.        Discharge Exam:   Subjective: Feeling well, frustrated about awaiting insurance authorization.  Reports he has been through  this several times and always improves with rehab.  Lives with his sister, feels good about returning there post-SNF.   Objective: Vitals:   12/24/23 0515 12/24/23 0754  BP: 117/78 123/74  Pulse: 64 62  Resp: 17 18  Temp: (!) 97.5 F (36.4 C) 98 F (36.7 C)  SpO2: 100% 99%    Intake/Output Summary (Last 24 hours) at 12/24/2023 1538 Last data filed at 12/24/2023 1046 Gross per 24 hour  Intake 0 ml  Output 325 ml  Net -325 ml   Filed Weights   12/09/23 1918  Weight: 90.7 kg    Exam:  General:  Appears calm and comfortable and is in NAD Eyes:  EOMI, normal lids, iris ENT:  grossly normal hearing, lips & tongue, mmm Neck:  no LAD, masses or thyromegaly Cardiovascular:  RRR, no m/r/g. No LE edema.  Respiratory:   CTA bilaterally with no wheezes/rales/rhonchi.  Normal respiratory effort. Abdomen:  soft, NT, ND Skin:  no rash or induration  seen on limited exam Musculoskeletal:  grossly normal tone BUE/BLE, good ROM, no bony abnormality Psychiatric:  grossly normal mood and affect, speech fluent and appropriate, AOx3 Neurologic:  CN 2-12 grossly intact, moves all extremities in coordinated fashion  Data Reviewed: I have reviewed the patient's lab results since admission.  Pertinent labs for today include:   Glucose 113 BUN 32/Creatinine 2.25/GFR 31, stable WBC 4.1 Hgb 9.3, stable Platelets 145, stable    Condition at discharge: improving  The results of significant diagnostics from this hospitalization (including imaging, microbiology, ancillary and laboratory) are listed below for reference.   Imaging Studies: CT HEAD WO CONTRAST ( ) Result Date: 12/09/2023 CLINICAL DATA:  Altered mental status EXAM: CT HEAD WITHOUT CONTRAST TECHNIQUE: Contiguous axial images were obtained from the base of the skull through the vertex without intravenous contrast. RADIATION DOSE REDUCTION: This exam was performed according to the departmental dose-optimization program which includes  automated exposure control, adjustment of the mA and/or kV according to patient size and/or use of iterative reconstruction technique. COMPARISON:  03/04/2023 FINDINGS: Brain: Right frontal encephalomalacia. Right posterior approach shunt catheter with tip in the anterior right lateral ventricle. No hydrocephalus. No acute hemorrhage. Vascular: No hyperdense vessel or unexpected vascular calcification. Skull: The visualized skull base, calvarium and extracranial soft tissues are normal. Sinuses/Orbits: No fluid levels or advanced mucosal thickening of the visualized paranasal sinuses. No mastoid or middle ear effusion. Normal orbits. Other: None. IMPRESSION: 1. No acute intracranial abnormality. 2. Right frontal encephalomalacia. 3. Right posterior approach shunt catheter with tip in the anterior right lateral ventricle. No hydrocephalus. Electronically Signed   By: Deatra Robinson M.D.   On: 12/09/2023 20:40    Microbiology: Results for orders placed or performed during the hospital encounter of 09/01/22  Blood Culture (routine x 2)     Status: None   Collection Time: 09/01/22  6:41 PM   Specimen: BLOOD  Result Value Ref Range Status   Specimen Description BLOOD BLOOD LEFT ARM  Final   Special Requests   Final    BOTTLES DRAWN AEROBIC AND ANAEROBIC Blood Culture adequate volume   Culture   Final    NO GROWTH 5 DAYS Performed at [facility], [address], [place], [place] [address]    Report Status 09/06/2022 FINAL  Final  SARS Coronavirus 2 by RT PCR (hospital order, performed in [facility] hospital lab) *cepheid single result test* Anterior Nasal Swab     Status: None   Collection Time: 09/01/22  6:42 PM   Specimen: Anterior Nasal Swab  Result Value Ref Range Status   SARS Coronavirus 2 by RT PCR NEGATIVE NEGATIVE Final    Comment: (NOTE) SARS-CoV-2 target nucleic acids are NOT DETECTED.  The SARS-CoV-2 RNA is generally detectable in upper and lower respiratory specimens  during the acute phase of infection. The lowest concentration of SARS-CoV-2 viral copies this assay can detect is 250 copies / mL. A negative result does not preclude SARS-CoV-2 infection and should not be used as the sole basis for treatment or other patient management decisions.  A negative result may occur with improper specimen collection / handling, submission of specimen other than nasopharyngeal swab, presence of viral mutation(s) within the areas targeted by this assay, and inadequate number of viral copies (<250 copies / mL). A negative result must be combined with clinical observations, patient history, and epidemiological information.  Fact Sheet for Patients:   RoadLapTop.co.za  Fact Sheet for Healthcare Providers: http://kim-miller.com/  This test is not  yet approved or  cleared by the [place] and has been authorized for detection and/or diagnosis of SARS-CoV-2 by FDA under an Emergency Use Authorization (EUA).  This EUA will remain in effect (meaning this test can be used) for the duration of the COVID-19 declaration under Section 564(b)(1) of the Act, 21 U.S.C. section 360bbb-3(b)(1), unless the authorization is terminated or revoked sooner.  Performed at [facility], [address] [place], [place] [address]   Culture, blood (Routine X 2) w Reflex to ID Panel     Status: None   Collection Time: 09/03/22  2:20 AM   Specimen: BLOOD  Result Value Ref Range Status   Specimen Description BLOOD BLOOD RIGHT ARM  Final   Special Requests   Final    IN BOTH AEROBIC AND ANAEROBIC BOTTLES Blood Culture adequate volume   Culture   Final    NO GROWTH 5 DAYS Performed at [facility], [address] [place], [place] [address]    Report Status 09/08/2022 FINAL  Final    Labs: CBC: Recent Labs  Lab 12/20/23 0609 12/21/23 0232 12/22/23 0542 12/23/23 0457 12/24/23 0446  WBC 4.4 5.4 4.4 3.8*  4.1  HGB 8.9* 9.3* 9.2* 9.4* 9.3*  HCT 27.0* 28.1* 28.0* 28.3* 28.4*  MCV 101.9* 100.7* 101.4* 99.6 101.8*  PLT 125* 144* 168 157 145*   Basic Metabolic Panel: Recent Labs  Lab 12/20/23 0609 12/21/23 0232 12/22/23 0542 12/23/23 0457 12/24/23 0446  NA 140 137 139 140 139  K 4.2 4.2 4.1 4.2 4.4  CL 113* 111 111 112* 113*  CO2 20* 20* 20* 22 22  GLUCOSE 111* 94 88 95 99  BUN 33* 31* 32* 32* 32*  CREATININE 2.15* 2.04* 2.22* 2.30* 2.25*  CALCIUM 8.5* 8.4* 8.7* 8.7* 8.5*   Liver Function Tests: No results for input(s): "AST", "ALT", "ALKPHOS", "BILITOT", "PROT", "ALBUMIN" in the last 168 hours. CBG: No results for input(s): "GLUCAP" in the last 168 hours.  Discharge time spent: greater than 30 minutes.  Signed: Jonah Blue, MD [facility] 12/24/2023

## 2023-12-24 NOTE — Progress Notes (Signed)
 Handover given to RN of  [facility]. Family aware of patient getting discharge. A/w EMS.

## 2023-12-24 NOTE — TOC Progression Note (Signed)
 Transition of Care [facility]) - Progression Note    Patient Details  Name: Albert Figueroa MRN: [id number] Date of Birth: 1956/07/29  Transition of Care [facility]) CM/SW Contact  Marlowe Sax, RN Phone Number: 12/24/2023, 1:49 PM  Clinical Narrative:      Approved Plan Auth ID: [id number] Dates: 3/25-3/27/25 next review date: 12/25/2023  Expected Discharge Plan: Skilled Nursing Facility Barriers to Discharge: SNF Pending bed offer, Insurance Authorization  Expected Discharge Plan and Services   Discharge Planning Services: CM Consult   Living arrangements for the past 2 months: Single Family Home                   DME Agency: NA       HH Arranged: NA           Social Determinants of Health (SDOH) Interventions SDOH Screenings   Food Insecurity: Patient Unable To Answer (12/10/2023)  Recent Concern: Food Insecurity - Food Insecurity Present (10/06/2023)   Received from [facility]  Housing: Patient Unable To Answer (12/10/2023)  Transportation Needs: Patient Unable To Answer (12/10/2023)  Utilities: Patient Unable To Answer (12/10/2023)  Financial Resource Strain: High Risk (10/06/2023)   Received from [facility]  Social Connections: Unknown (12/10/2023)  Tobacco Use: High Risk (12/09/2023)    Readmission Risk Interventions    03/06/2023    3:23 PM  Readmission Risk Prevention Plan  Transportation Screening Complete  PCP or Specialist Appt within 3-5 Days Complete  HRI or Home Care Consult --  Social Work Consult for Recovery Care Planning/Counseling Complete  Palliative Care Screening Not Applicable  Medication Review Oceanographer) Complete

## 2023-12-24 NOTE — Hospital Course (Signed)
 87VH with h/o DM, COPD, HTN, HLD, stage 4 CKD, Lewy body dementia, and seizure disorder who presented on 3/11 with AMS and generalized weakness.  He was found to have AKI and hepatic encephalopathy and has stabilized.  He is awaiting insurance approval for SNF rehab placement.

## 2023-12-24 NOTE — Plan of Care (Signed)
   Problem: Coping: Goal: Level of anxiety will decrease Outcome: Progressing

## 2023-12-24 NOTE — TOC Progression Note (Addendum)
 Transition of Care [facility]) - Progression Note    Patient Details  Name: Albert Figueroa MRN: [id number] Date of Birth: 01/18/1956  Transition of Care [facility]) CM/SW Contact  Marlowe Sax, RN Phone Number: 12/24/2023, 3:36 PM  Clinical Narrative:     Spoke with the patient and his daughter I explained that he will go to [place] rehab room 508, they are agreeable, Ins approved  Will need EMS transport [facility] called to transport  Expected Discharge Plan: Skilled Nursing Facility Barriers to Discharge: SNF Pending bed offer, Insurance Authorization  Expected Discharge Plan and Services   Discharge Planning Services: CM Consult   Living arrangements for the past 2 months: Single Family Home                   DME Agency: NA       HH Arranged: NA           Social Determinants of Health (SDOH) Interventions SDOH Screenings   Food Insecurity: Patient Unable To Answer (12/10/2023)  Recent Concern: Food Insecurity - Food Insecurity Present (10/06/2023)   Received from [facility]  Housing: Patient Unable To Answer (12/10/2023)  Transportation Needs: Patient Unable To Answer (12/10/2023)  Utilities: Patient Unable To Answer (12/10/2023)  Financial Resource Strain: High Risk (10/06/2023)   Received from [facility]  Social Connections: Unknown (12/10/2023)  Tobacco Use: High Risk (12/09/2023)    Readmission Risk Interventions    03/06/2023    3:23 PM  Readmission Risk Prevention Plan  Transportation Screening Complete  PCP or Specialist Appt within 3-5 Days Complete  HRI or Home Care Consult --  Social Work Consult for Recovery Care Planning/Counseling Complete  Palliative Care Screening Not Applicable  Medication Review Oceanographer) Complete

## 2023-12-25 NOTE — Progress Notes (Signed)
 Patient left via EMS to [facility] rehab. Patient has all belongings. Paperwork given to transport team. Patient was given all nighttime medications.

## 2023-12-29 ENCOUNTER — Other Ambulatory Visit: Payer: Self-pay

## 2023-12-29 ENCOUNTER — Inpatient Hospital Stay
Admission: EM | Admit: 2023-12-29 | Discharge: 2024-01-01 | DRG: 392 | Disposition: A | Discharge status: Home / Self Care | Attending: Student | Admitting: Student

## 2023-12-29 DIAGNOSIS — E1129 Type 2 diabetes mellitus with other diabetic kidney complication: Secondary | ICD-10-CM | POA: Diagnosis present

## 2023-12-29 DIAGNOSIS — Z79899 Other long term (current) drug therapy: Secondary | ICD-10-CM

## 2023-12-29 DIAGNOSIS — I129 Hypertensive chronic kidney disease with stage 1 through stage 4 chronic kidney disease, or unspecified chronic kidney disease: Secondary | ICD-10-CM | POA: Diagnosis present

## 2023-12-29 DIAGNOSIS — E872 Acidosis, unspecified: Secondary | ICD-10-CM | POA: Diagnosis present

## 2023-12-29 DIAGNOSIS — N179 Acute kidney failure, unspecified: Secondary | ICD-10-CM | POA: Diagnosis present

## 2023-12-29 DIAGNOSIS — R112 Nausea with vomiting, unspecified: Principal | ICD-10-CM

## 2023-12-29 DIAGNOSIS — Z888 Allergy status to other drugs, medicaments and biological substances status: Secondary | ICD-10-CM

## 2023-12-29 DIAGNOSIS — D696 Thrombocytopenia, unspecified: Secondary | ICD-10-CM | POA: Diagnosis present

## 2023-12-29 DIAGNOSIS — E1122 Type 2 diabetes mellitus with diabetic chronic kidney disease: Secondary | ICD-10-CM | POA: Diagnosis present

## 2023-12-29 DIAGNOSIS — R197 Diarrhea, unspecified: Principal | ICD-10-CM

## 2023-12-29 DIAGNOSIS — G3183 Dementia with Lewy bodies: Secondary | ICD-10-CM | POA: Diagnosis present

## 2023-12-29 DIAGNOSIS — G894 Chronic pain syndrome: Secondary | ICD-10-CM | POA: Diagnosis present

## 2023-12-29 DIAGNOSIS — A0811 Acute gastroenteropathy due to Norwalk agent: Secondary | ICD-10-CM | POA: Diagnosis not present

## 2023-12-29 DIAGNOSIS — E860 Dehydration: Secondary | ICD-10-CM | POA: Diagnosis present

## 2023-12-29 DIAGNOSIS — N189 Chronic kidney disease, unspecified: Secondary | ICD-10-CM | POA: Diagnosis present

## 2023-12-29 DIAGNOSIS — F17210 Nicotine dependence, cigarettes, uncomplicated: Secondary | ICD-10-CM | POA: Diagnosis present

## 2023-12-29 DIAGNOSIS — E559 Vitamin D deficiency, unspecified: Secondary | ICD-10-CM | POA: Diagnosis present

## 2023-12-29 DIAGNOSIS — R531 Weakness: Secondary | ICD-10-CM

## 2023-12-29 DIAGNOSIS — D61818 Other pancytopenia: Secondary | ICD-10-CM | POA: Diagnosis not present

## 2023-12-29 DIAGNOSIS — Z961 Presence of intraocular lens: Secondary | ICD-10-CM | POA: Diagnosis present

## 2023-12-29 DIAGNOSIS — E7220 Disorder of urea cycle metabolism, unspecified: Secondary | ICD-10-CM

## 2023-12-29 DIAGNOSIS — Z8661 Personal history of infections of the central nervous system: Secondary | ICD-10-CM

## 2023-12-29 DIAGNOSIS — Z9842 Cataract extraction status, left eye: Secondary | ICD-10-CM

## 2023-12-29 DIAGNOSIS — Z8249 Family history of ischemic heart disease and other diseases of the circulatory system: Secondary | ICD-10-CM

## 2023-12-29 DIAGNOSIS — Z982 Presence of cerebrospinal fluid drainage device: Secondary | ICD-10-CM

## 2023-12-29 DIAGNOSIS — I9589 Other hypotension: Secondary | ICD-10-CM | POA: Diagnosis present

## 2023-12-29 DIAGNOSIS — Z833 Family history of diabetes mellitus: Secondary | ICD-10-CM

## 2023-12-29 DIAGNOSIS — Z9841 Cataract extraction status, right eye: Secondary | ICD-10-CM

## 2023-12-29 DIAGNOSIS — Z79891 Long term (current) use of opiate analgesic: Secondary | ICD-10-CM

## 2023-12-29 DIAGNOSIS — F0280 Dementia in other diseases classified elsewhere without behavioral disturbance: Secondary | ICD-10-CM | POA: Diagnosis present

## 2023-12-29 DIAGNOSIS — A419 Sepsis, unspecified organism: Secondary | ICD-10-CM

## 2023-12-29 DIAGNOSIS — J439 Emphysema, unspecified: Secondary | ICD-10-CM | POA: Diagnosis present

## 2023-12-29 DIAGNOSIS — N184 Chronic kidney disease, stage 4 (severe): Secondary | ICD-10-CM | POA: Diagnosis present

## 2023-12-29 DIAGNOSIS — E785 Hyperlipidemia, unspecified: Secondary | ICD-10-CM | POA: Diagnosis present

## 2023-12-29 LAB — COMPREHENSIVE METABOLIC PANEL WITH GFR
ALT: 12 U/L (ref 0–44)
AST: 25 U/L (ref 15–41)
Albumin: 3.3 g/dL — ABNORMAL LOW (ref 3.5–5.0)
Alkaline Phosphatase: 75 U/L (ref 38–126)
Anion gap: 12 (ref 5–15)
BUN: 44 mg/dL — ABNORMAL HIGH (ref 8–23)
CO2: 16 mmol/L — ABNORMAL LOW (ref 22–32)
Calcium: 9 mg/dL (ref 8.9–10.3)
Chloride: 114 mmol/L — ABNORMAL HIGH (ref 98–111)
Creatinine, Ser: 2.71 mg/dL — ABNORMAL HIGH (ref 0.61–1.24)
GFR, Estimated: 25 mL/min — ABNORMAL LOW (ref >60)
Glucose, Bld: 144 mg/dL — ABNORMAL HIGH (ref 70–99)
Potassium: 4 mmol/L (ref 3.5–5.1)
Sodium: 142 mmol/L (ref 135–145)
Total Bilirubin: 1.1 mg/dL (ref 0.0–1.2)
Total Protein: 7.9 g/dL (ref 6.5–8.1)

## 2023-12-29 LAB — CBC
HCT: 36.8 % — ABNORMAL LOW (ref 39.0–52.0)
Hemoglobin: 11.9 g/dL — ABNORMAL LOW (ref 13.0–17.0)
MCH: 33.7 pg (ref 26.0–34.0)
MCHC: 32.3 g/dL (ref 30.0–36.0)
MCV: 104.2 fL — ABNORMAL HIGH (ref 80.0–100.0)
Platelets: 121 10*3/uL — ABNORMAL LOW (ref 150–400)
RBC: 3.53 MIL/uL — ABNORMAL LOW (ref 4.22–5.81)
RDW: 14.6 % (ref 11.5–15.5)
WBC: 8.6 10*3/uL (ref 4.0–10.5)
nRBC: 0 % (ref 0.0–0.2)

## 2023-12-29 LAB — RESP PANEL BY RT-PCR (RSV, FLU A&B, COVID)  RVPGX2
Influenza A by PCR: NEGATIVE
Influenza B by PCR: NEGATIVE
Resp Syncytial Virus by PCR: NEGATIVE
SARS Coronavirus 2 by RT PCR: NEGATIVE

## 2023-12-29 LAB — LIPASE, BLOOD: Lipase: 42 U/L (ref 11–51)

## 2023-12-29 MED ORDER — SODIUM CHLORIDE 0.9 % IV BOLUS (SEPSIS)
1000.0000 mL | Freq: Once | INTRAVENOUS | Status: AC
Start: 2023-12-30 — End: 2023-12-30
  Administered 2023-12-30: 1000 mL via INTRAVENOUS

## 2023-12-29 MED ORDER — FENTANYL CITRATE PF 50 MCG/ML IJ SOSY: 50.0000 ug | PREFILLED_SYRINGE | Freq: Once | INTRAMUSCULAR | Status: DC

## 2023-12-29 MED ORDER — ACETAMINOPHEN 325 MG PO TABS
650.0000 mg | ORAL_TABLET | Freq: Once | ORAL | Status: AC
  Administered 2023-12-29: 650 mg via ORAL
  Filled 2023-12-29: qty 2

## 2023-12-29 MED ORDER — ONDANSETRON HCL 4 MG/2ML IJ SOLN
4.0000 mg | Freq: Once | INTRAMUSCULAR | Status: AC
  Administered 2023-12-30: 4 mg via INTRAVENOUS
  Filled 2023-12-29: qty 2

## 2023-12-29 NOTE — ED Notes (Signed)
 First nurse note-pt brought in via ems from home. Pt released from rehab post stroke 1hour ago.  Pt reports chills and not feeling well.  Bp 170/100, p99, oxygen sats93% per ems.  Pt in wheelchair in lobby   pt alert

## 2023-12-29 NOTE — ED Triage Notes (Addendum)
 Pt BIB AEMS from [facility] d/t diarrhea - "has a stomach bug".  He also states it is the "nastiest place he has ever been" and wants to go home.  He states everyone there has had Norovirus.

## 2023-12-29 NOTE — ED Provider Notes (Signed)
 [facility] Provider Note    Event Date/Time   First MD Initiated Contact with Patient 12/29/23 2304     (approximate)   History   Diarrhea   HPI  Albert Figueroa is a 68 y.o. male with history of hypertension, diabetes, hyperlipidemia, Lewy body dementia, lumbar stenosis, chronic kidney disease, COPD, epilepsy who presents to the emergency department with complaints of vomiting, diarrhea and generalized abdominal pain.  States he was just discharged from from the hospital and has been at [facility].  He states there are other sick patients there with similar symptoms.  Denies any fevers, cough, congestion, chest pain or shortness of breath.  He denies being on any recent antibiotics.  Denies prior abdominal surgery.   History provided by patient, somewhat limited secondary to AMS.    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, spinal meningitis   Hyperlipidemia    Hypertension    Kidney disease    stage 3   Lewy body dementia (HCC)    short term memory issues only   Lumbar stenosis    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 in the 1980s"   Osteoarthrosis    Seizure (HCC)    none since 2009   Stage 4 chronic kidney disease (HCC)    Tremor of both hands     Past Surgical History:  Procedure Laterality Date   CATARACT EXTRACTION W/PHACO Right 05/14/2023   Procedure: CATARACT EXTRACTION PHACO AND INTRAOCULAR LENS PLACEMENT (IOC) RIGHT DIABETIC;  Surgeon: Lockie Mola, MD;  Location: [facility];  Service: Ophthalmology;  Laterality: Right;  6.70 0:39.8   CATARACT EXTRACTION W/PHACO Left 05/28/2023   Procedure: CATARACT EXTRACTION PHACO AND INTRAOCULAR LENS PLACEMENT (IOC) LEFT DIABETIC 6.34 00:37.1;  Surgeon: Lockie Mola, MD;  Location: [facility];  Service: Ophthalmology;  Laterality: Left;   CSF SHUNT     placed in 1980s   KNEE SURGERY     multiple surgeries on both knees    MEDICATIONS:  Prior to Admission medications   Medication Sig Start Date End Date Taking? Authorizing Provider  acetaminophen (TYLENOL) 325 MG tablet Take 2 tablets (650 mg total) by mouth every 6 (six) hours as needed for mild pain (or Fever >/= 101). 09/07/22   Hollice Espy, MD  albuterol (VENTOLIN HFA) 108 (90 Base) MCG/ACT inhaler Inhale into the lungs every 6 (six) hours as needed for wheezing or shortness of breath.    [provider]  Cholecalciferol (VITAMIN D-1000 MAX ST) 25 MCG (1000 UT) tablet Take by mouth. 09/26/23   [provider]  dapagliflozin propanediol (FARXIGA) 10 MG TABS tablet Take 10 mg by mouth daily.    [provider]  dexlansoprazole (DEXILANT) 60 MG capsule Take 60 mg by mouth daily.    [provider]  divalproex (DEPAKOTE ER) 500 MG 24 hr tablet Take 1,000 mg by mouth in the morning and at bedtime.    [provider]  donepezil (ARICEPT ODT) 10 MG disintegrating tablet Take 10 mg by mouth at bedtime.    [provider]  fenofibrate 160 MG tablet Take 160 mg by mouth daily. 04/25/22   [provider]  ferrous sulfate 325 (65 FE) MG tablet Take 325  mg by mouth daily with breakfast.    [provider]  Fluticasone-Umeclidin-Vilant (TRELEGY ELLIPTA) 100-62.5-25 MCG/ACT AEPB Inhale into the lungs daily.    [provider]  hydrOXYzine (ATARAX) 10 MG tablet Take 10 mg by mouth 3 (three) times daily as needed.    [provider]  lactulose (CHRONULAC) 10 GM/15ML solution Take 45 mLs (30 g total) by mouth 3 (three) times daily. 12/24/23   Jonah Blue, MD  naloxone [facility]) nasal spray 4 mg/0.1 mL Place 1 spray into the nose once. 11/29/22   [provider]  Oxycodone HCl 20 MG TABS Take 1 tablet (20 mg total) by mouth 5 (five) times  daily. 12/24/23   Jonah Blue, MD  Roflumilast 250 MCG TABS Take 1 tablet by mouth daily.    [provider]  rosuvastatin (CRESTOR) 20 MG tablet Take 20 mg by mouth at bedtime. 04/08/22   [provider]  tiZANidine (ZANAFLEX) 4 MG capsule Take 4 mg by mouth at bedtime.    [provider]  traZODone (DESYREL) 50 MG tablet Take 0.5 tablets (25 mg total) by mouth at bedtime as needed for sleep. 12/24/23   Jonah Blue, MD    Physical Exam   Triage Vital Signs: ED Triage Vitals  Encounter Vitals Group     BP 12/29/23 2117 96/62     Systolic BP Percentile --      Diastolic BP Percentile --      Pulse Rate 12/29/23 2117 (!) 110     Resp 12/29/23 2117 20     Temp 12/29/23 2117 98.5 F (36.9 C)     Temp Source 12/29/23 2117 Oral     SpO2 12/29/23 2117 100 %     Weight 12/29/23 2114 171 lb (77.6 kg)     Height 12/29/23 2114 5\' 11"  (1.803 m)     Head Circumference --      Peak Flow --      Pain Score 12/29/23 2114 8     Pain Loc --      Pain Education --      Exclude from Growth Chart --     Most recent vital signs: Vitals:   12/30/23 0430 12/30/23 0530  BP: 109/62 (!) 102/58  Pulse: 80 77  Resp: 16 16  Temp: 98.3 F (36.8 C)   SpO2: 100% 100%    CONSTITUTIONAL: Alert, responds appropriately to questions but falls asleep quickly during questioning.  Chronically ill-appearing HEAD: Normocephalic, atraumatic EYES: Conjunctivae clear, pupils appear equal, sclera nonicteric ENT: normal nose; moist mucous membranes NECK: Supple, normal ROM CARD: Regular and tachycardic; S1 and S2 appreciated RESP: Normal chest excursion without splinting or tachypnea; breath sounds clear and equal bilaterally; no wheezes, no rhonchi, no rales, no hypoxia or respiratory distress, speaking full sentences ABD/GI: Diffusely tender with guarding, no rebound, incontinent of brown foul-smelling stool without blood or melena BACK: The back appears normal EXT: Normal ROM in  all joints; no deformity noted, no edema SKIN: Normal color for age and race; warm; no rash on exposed skin NEURO: Moves all extremities equally, normal speech PSYCH: The patient's mood and manner are appropriate.   ED Results / Procedures / Treatments   LABS: (all labs ordered are listed, but only abnormal results are displayed) Labs Reviewed  COMPREHENSIVE METABOLIC PANEL WITH GFR - Abnormal; Notable for the following components:      Result Value   Chloride 114 (*)    CO2 16 (*)    Glucose,  Bld 144 (*)    BUN 44 (*)    Creatinine, Ser 2.71 (*)    Albumin 3.3 (*)    GFR, Estimated 25 (*)    All other components within normal limits  CBC - Abnormal; Notable for the following components:   RBC 3.53 (*)    Hemoglobin 11.9 (*)    HCT 36.8 (*)    MCV 104.2 (*)    Platelets 121 (*)    All other components within normal limits  URINALYSIS, ROUTINE W REFLEX MICROSCOPIC - Abnormal; Notable for the following components:   Color, Urine YELLOW (*)    APPearance CLEAR (*)    Glucose, UA >=500 (*)    All other components within normal limits  LACTIC ACID, PLASMA - Abnormal; Notable for the following components:   Lactic Acid, Venous 2.1 (*)    All other components within normal limits  LACTIC ACID, PLASMA - Abnormal; Notable for the following components:   Lactic Acid, Venous 2.5 (*)    All other components within normal limits  URINE DRUG SCREEN, QUALITATIVE ([facility] ONLY) - Abnormal; Notable for the following components:   MDMA (Ecstasy)Ur Screen POSITIVE (*)    All other components within normal limits  RESP PANEL BY RT-PCR (RSV, FLU A&B, COVID)  RVPGX2  CULTURE, BLOOD (SINGLE)  GASTROINTESTINAL PANEL BY PCR, STOOL (REPLACES STOOL CULTURE)  C DIFFICILE QUICK SCREEN W PCR REFLEX    LIPASE, BLOOD  VALPROIC ACID LEVEL  AMMONIA  ETHANOL  LACTIC ACID, PLASMA  LACTIC ACID, PLASMA  LACTIC ACID, PLASMA     EKG:  EKG Interpretation Date/Time:    Ventricular Rate:    PR  Interval:    QRS Duration:    QT Interval:    QTC Calculation:   R Axis:      Text Interpretation:           RADIOLOGY: My personal review and interpretation of imaging: CT scan shows no acute abnormality.  I have personally reviewed all radiology reports.   CT ABDOMEN PELVIS WO CONTRAST Result Date: 12/30/2023 CLINICAL DATA:  Abdominal pain, acute, nonlocalized EXAM: CT ABDOMEN AND PELVIS WITHOUT CONTRAST TECHNIQUE: Multidetector CT imaging of the abdomen and pelvis was performed following the standard protocol without IV contrast. RADIATION DOSE REDUCTION: This exam was performed according to the departmental dose-optimization program which includes automated exposure control, adjustment of the mA and/or kV according to patient size and/or use of iterative reconstruction technique. COMPARISON:  None Available. FINDINGS: Lower chest: No acute abnormality. Coronary artery and aortic atherosclerosis. Hepatobiliary: No focal hepatic abnormality. Gallbladder unremarkable. Pancreas: No focal abnormality or ductal dilatation. Spleen: No focal abnormality.  Normal size. Adrenals/Urinary Tract: Adrenal glands normal. Bilateral renal cysts. No follow-up imaging recommended. Renovascular calcifications. No stones or hydronephrosis. Urinary bladder unremarkable. Stomach/Bowel: Stomach, large and small bowel grossly unremarkable. Vascular/Lymphatic: Aortoiliac atherosclerosis. No evidence of aneurysm or adenopathy. Reproductive: No visible focal abnormality. Other: No free fluid or free air. VP shunt catheter in place terminating in the right lower quadrant. Musculoskeletal: No acute bony abnormality. IMPRESSION: No acute findings in the abdomen or pelvis. Diffuse coronary artery disease, aortic atherosclerosis. VP shunt terminates in the right lower quadrant, grossly unremarkable. Electronically Signed   By: Charlett Nose M.D.   On: 12/30/2023 01:29   CT HEAD WO CONTRAST ( ) Result Date:  12/30/2023 CLINICAL DATA:  Altered mental status EXAM: CT HEAD WITHOUT CONTRAST TECHNIQUE: Contiguous axial images were obtained from the base of the skull through the vertex without  intravenous contrast. RADIATION DOSE REDUCTION: This exam was performed according to the departmental dose-optimization program which includes automated exposure control, adjustment of the mA and/or kV according to patient size and/or use of iterative reconstruction technique. COMPARISON:  12/09/2023 FINDINGS: Brain: Right occipital approach shunt catheter terminating in the right lateral ventricle. Bilateral high frontal encephalomalacia. No mass or acute hemorrhage. Vascular: No hyperdense vessel or unexpected vascular calcification. Skull: The visualized skull base, calvarium and extracranial soft tissues are normal. Sinuses/Orbits: No fluid levels or advanced mucosal thickening of the visualized paranasal sinuses. No mastoid or middle ear effusion. Normal orbits. Other: None. IMPRESSION: 1. No acute intracranial abnormality. 2. Right occipital approach shunt catheter terminating in the right lateral ventricle. No hydrocephalus. 3. Bilateral high frontal encephalomalacia. Electronically Signed   By: Deatra Robinson M.D.   On: 12/30/2023 01:29     PROCEDURES:  Critical Care performed: Yes, see critical care procedure note(s)   CRITICAL CARE Performed by: Baxter Hire Junella Domke   Total critical care time: 30 minutes  Critical care time was exclusive of separately billable procedures and treating other patients.  Critical care was necessary to treat or prevent imminent or life-threatening deterioration.  Critical care was time spent personally by me on the following activities: development of treatment plan with patient and/or surrogate as well as nursing, discussions with consultants, evaluation of patient's response to treatment, examination of patient, obtaining history from patient or surrogate, ordering and performing  treatments and interventions, ordering and review of laboratory studies, ordering and review of radiographic studies, pulse oximetry and re-evaluation of patient's condition.   Marland Kitchen1-3 Lead EKG Interpretation  Performed by: Naquan Garman, Layla Maw, DO Authorized by: Azarie Coriz, Layla Maw, DO     Interpretation: abnormal     ECG rate:  110   ECG rate assessment: tachycardic     Rhythm: sinus tachycardia     Ectopy: none     Conduction: normal       IMPRESSION / MDM / ASSESSMENT AND PLAN / ED COURSE  I reviewed the triage vital signs and the nursing notes.    Patient here with vomiting, diarrhea.  Hypotensive and tachycardic.  The patient is on the cardiac monitor to evaluate for evidence of arrhythmia and/or significant heart rate changes.   DIFFERENTIAL DIAGNOSIS (includes but not limited to):   Viral gastroenteritis, dehydration, anemia, electrolyte derangement, sepsis, colitis, diverticulitis, appendicitis, UTI   Patient's presentation is most consistent with acute presentation with potential threat to life or bodily function.   PLAN: Will obtain labs, abdominal imaging, urine.  Will give IV fluids, pain and nausea medicine.  Blood pressure currently 96/62.  This could be from hypovolemia from GI loss but sepsis also on the differential.  Will send stool studies as well.  MEDICATIONS GIVEN IN ED: Medications  acetaminophen (TYLENOL) tablet 650 mg (650 mg Oral Given 12/29/23 2124)  sodium chloride 0.9 % bolus 1,000 mL (0 mLs Intravenous Stopped 12/30/23 0140)  ondansetron (ZOFRAN) injection 4 mg (4 mg Intravenous Given 12/30/23 0101)  lactated ringers bolus 1,000 mL (0 mLs Intravenous Stopped 12/30/23 0319)  piperacillin-tazobactam (ZOSYN) IVPB 3.375 g (0 g Intravenous Stopped 12/30/23 0502)  lactated ringers bolus 1,000 mL (1,000 mLs Intravenous New Bag/Given 12/30/23 0425)     ED COURSE: Patient's initial lactic was 2.1 but after 2 L of fluids up trended to 2.5.  Will give third liter of fluid.   He does have acute on chronic kidney injury.  Has not been able to provide a stool sample  here after initially being incontinent of stool.  CT of the abdomen pelvis reviewed and interpreted by myself and the radiologist and shows no acute abnormality.  Given rising lactic with initial hypotension and tachycardia, will admit for sepsis likely due to a viral gastroenteritis.  Stool studies still pending.  Will discuss with the hospitalist.  As for his somnolence, confusion, I suspect that this is secondary to his Lewy body dementia.  He does have a VP shunt but there is no hydrocephalus or acute abnormality seen on CT of the head.  Ammonia level normal.  Negative alcohol level.  Drug screen is positive for MDMA but this may be secondary to metabolites from trazodone which he takes.  Urine shows no sign of infection.     CONSULTS:  Consulted and discussed patient's case with hospitalist, Dr. Para March.  I have recommended admission and consulting physician agrees and will place admission orders.  Patient (and family if present) agree with this plan.   I reviewed all nursing notes, vitals, pertinent previous records.  All labs, EKGs, imaging ordered have been independently reviewed and interpreted by myself.    OUTSIDE RECORDS REVIEWED: Reviewed patient's admission from 3 11/25 to 12/24/2023 for hepatic encephalopathy, acute kidney injury.       FINAL CLINICAL IMPRESSION(S) / ED DIAGNOSES   Final diagnoses:  Nausea vomiting and diarrhea  Acute sepsis (HCC)  Acute kidney injury superimposed on chronic kidney disease (HCC)     Rx / DC Orders   ED Discharge Orders     None        Note:  This document was prepared using Dragon voice recognition software and may include unintentional dictation errors.   Kirsten Spearing, Layla Maw, DO 12/30/23 [phone]

## 2023-12-30 ENCOUNTER — Encounter: Payer: Self-pay | Admitting: Internal Medicine

## 2023-12-30 ENCOUNTER — Emergency Department

## 2023-12-30 DIAGNOSIS — E785 Hyperlipidemia, unspecified: Secondary | ICD-10-CM

## 2023-12-30 DIAGNOSIS — E872 Acidosis, unspecified: Secondary | ICD-10-CM

## 2023-12-30 DIAGNOSIS — I129 Hypertensive chronic kidney disease with stage 1 through stage 4 chronic kidney disease, or unspecified chronic kidney disease: Secondary | ICD-10-CM | POA: Diagnosis present

## 2023-12-30 DIAGNOSIS — R112 Nausea with vomiting, unspecified: Secondary | ICD-10-CM | POA: Diagnosis present

## 2023-12-30 DIAGNOSIS — E860 Dehydration: Secondary | ICD-10-CM | POA: Diagnosis present

## 2023-12-30 DIAGNOSIS — D696 Thrombocytopenia, unspecified: Secondary | ICD-10-CM

## 2023-12-30 DIAGNOSIS — Z888 Allergy status to other drugs, medicaments and biological substances status: Secondary | ICD-10-CM | POA: Diagnosis not present

## 2023-12-30 DIAGNOSIS — Z9842 Cataract extraction status, left eye: Secondary | ICD-10-CM | POA: Diagnosis not present

## 2023-12-30 DIAGNOSIS — R197 Diarrhea, unspecified: Secondary | ICD-10-CM

## 2023-12-30 DIAGNOSIS — F17210 Nicotine dependence, cigarettes, uncomplicated: Secondary | ICD-10-CM | POA: Diagnosis present

## 2023-12-30 DIAGNOSIS — Z8249 Family history of ischemic heart disease and other diseases of the circulatory system: Secondary | ICD-10-CM | POA: Diagnosis not present

## 2023-12-30 DIAGNOSIS — N179 Acute kidney failure, unspecified: Secondary | ICD-10-CM

## 2023-12-30 DIAGNOSIS — G894 Chronic pain syndrome: Secondary | ICD-10-CM

## 2023-12-30 DIAGNOSIS — G3183 Dementia with Lewy bodies: Secondary | ICD-10-CM | POA: Diagnosis present

## 2023-12-30 DIAGNOSIS — E7220 Disorder of urea cycle metabolism, unspecified: Secondary | ICD-10-CM | POA: Diagnosis present

## 2023-12-30 DIAGNOSIS — A09 Infectious gastroenteritis and colitis, unspecified: Secondary | ICD-10-CM

## 2023-12-30 DIAGNOSIS — F0280 Dementia in other diseases classified elsewhere without behavioral disturbance: Secondary | ICD-10-CM | POA: Diagnosis present

## 2023-12-30 DIAGNOSIS — Z833 Family history of diabetes mellitus: Secondary | ICD-10-CM | POA: Diagnosis not present

## 2023-12-30 DIAGNOSIS — D61818 Other pancytopenia: Secondary | ICD-10-CM | POA: Diagnosis not present

## 2023-12-30 DIAGNOSIS — Z961 Presence of intraocular lens: Secondary | ICD-10-CM | POA: Diagnosis present

## 2023-12-30 DIAGNOSIS — E1122 Type 2 diabetes mellitus with diabetic chronic kidney disease: Secondary | ICD-10-CM

## 2023-12-30 DIAGNOSIS — N184 Chronic kidney disease, stage 4 (severe): Secondary | ICD-10-CM | POA: Diagnosis present

## 2023-12-30 DIAGNOSIS — Z79899 Other long term (current) drug therapy: Secondary | ICD-10-CM | POA: Diagnosis not present

## 2023-12-30 DIAGNOSIS — J439 Emphysema, unspecified: Secondary | ICD-10-CM | POA: Diagnosis present

## 2023-12-30 DIAGNOSIS — Z9841 Cataract extraction status, right eye: Secondary | ICD-10-CM | POA: Diagnosis not present

## 2023-12-30 DIAGNOSIS — N1832 Chronic kidney disease, stage 3b: Secondary | ICD-10-CM

## 2023-12-30 DIAGNOSIS — I9589 Other hypotension: Secondary | ICD-10-CM | POA: Diagnosis present

## 2023-12-30 DIAGNOSIS — R531 Weakness: Secondary | ICD-10-CM

## 2023-12-30 DIAGNOSIS — E559 Vitamin D deficiency, unspecified: Secondary | ICD-10-CM | POA: Diagnosis present

## 2023-12-30 DIAGNOSIS — A0811 Acute gastroenteropathy due to Norwalk agent: Secondary | ICD-10-CM | POA: Diagnosis present

## 2023-12-30 DIAGNOSIS — N189 Chronic kidney disease, unspecified: Secondary | ICD-10-CM

## 2023-12-30 LAB — LACTIC ACID, PLASMA
Lactic Acid, Venous: 2.1 mmol/L (ref 0.5–1.9)
Lactic Acid, Venous: 2.5 mmol/L (ref 0.5–1.9)
Lactic Acid, Venous: 2.7 mmol/L (ref 0.5–1.9)
Lactic Acid, Venous: 1.7 mmol/L (ref 0.5–1.9)
Lactic Acid, Venous: 1.8 mmol/L (ref 0.5–1.9)

## 2023-12-30 LAB — BASIC METABOLIC PANEL WITH GFR
Anion gap: 7 (ref 5–15)
BUN: 38 mg/dL — ABNORMAL HIGH (ref 8–23)
CO2: 16 mmol/L — ABNORMAL LOW (ref 22–32)
Calcium: 8 mg/dL — ABNORMAL LOW (ref 8.9–10.3)
Chloride: 117 mmol/L — ABNORMAL HIGH (ref 98–111)
Creatinine, Ser: 2.46 mg/dL — ABNORMAL HIGH (ref 0.61–1.24)
GFR, Estimated: 28 mL/min — ABNORMAL LOW (ref >60)
Glucose, Bld: 89 mg/dL (ref 70–99)
Potassium: 4.2 mmol/L (ref 3.5–5.1)
Sodium: 140 mmol/L (ref 135–145)

## 2023-12-30 LAB — GASTROINTESTINAL PANEL BY PCR, STOOL (REPLACES STOOL CULTURE)

## 2023-12-30 LAB — C DIFFICILE QUICK SCREEN W PCR REFLEX
C Diff antigen: POSITIVE — AB
C Diff toxin: NEGATIVE

## 2023-12-30 LAB — URINALYSIS, ROUTINE W REFLEX MICROSCOPIC
Bacteria, UA: NONE SEEN
Bilirubin Urine: NEGATIVE
Glucose, UA: >=500 mg/dL — AB
Hgb urine dipstick: NEGATIVE
Ketones, ur: NEGATIVE mg/dL
Leukocytes,Ua: NEGATIVE
Nitrite: NEGATIVE
Protein, ur: NEGATIVE mg/dL
RBC / HPF: 0 RBC/hpf (ref 0–5)
Specific Gravity, Urine: 1.02 (ref 1.005–1.030)
Squamous Epithelial / HPF: 0 /HPF (ref 0–5)
pH: 5 (ref 5.0–8.0)

## 2023-12-30 LAB — URINE DRUG SCREEN, QUALITATIVE (ARMC ONLY)
Amphetamines, Ur Screen: NOT DETECTED
Barbiturates, Ur Screen: NOT DETECTED
Benzodiazepine, Ur Scrn: NOT DETECTED
Cannabinoid 50 Ng, Ur ~~LOC~~: NOT DETECTED
Cocaine Metabolite,Ur ~~LOC~~: NOT DETECTED
MDMA (Ecstasy)Ur Screen: POSITIVE — AB
Methadone Scn, Ur: NOT DETECTED
Opiate, Ur Screen: NOT DETECTED
Phencyclidine (PCP) Ur S: NOT DETECTED
Tricyclic, Ur Screen: NOT DETECTED

## 2023-12-30 LAB — CLOSTRIDIUM DIFFICILE BY PCR, REFLEXED: Toxigenic C. Difficile by PCR: POSITIVE — AB

## 2023-12-30 LAB — AMMONIA: Ammonia: 22 umol/L (ref 9–35)

## 2023-12-30 LAB — VALPROIC ACID LEVEL: Valproic Acid Lvl: 76 ug/mL (ref 50.0–100.0)

## 2023-12-30 LAB — ETHANOL: Alcohol, Ethyl (B): <10 mg/dL (ref <10)

## 2023-12-30 MED ORDER — UMECLIDINIUM BROMIDE 62.5 MCG/ACT IN AEPB
1.0000 | INHALATION_SPRAY | Freq: Every day | RESPIRATORY_TRACT | Status: DC
  Administered 2023-12-31 – 2024-01-01 (×2): 1 via RESPIRATORY_TRACT
  Filled 2023-12-30: qty 7

## 2023-12-30 MED ORDER — ROFLUMILAST 500 MCG PO TABS
250.0000 ug | ORAL_TABLET | Freq: Every day | ORAL | Status: DC
  Administered 2023-12-31 – 2024-01-01 (×2): 250 ug via ORAL
  Filled 2023-12-30 (×2): qty 1

## 2023-12-30 MED ORDER — LACTATED RINGERS IV BOLUS
1000.0000 mL | Freq: Once | INTRAVENOUS | Status: AC
  Administered 2023-12-30: 1000 mL via INTRAVENOUS

## 2023-12-30 MED ORDER — PANTOPRAZOLE SODIUM 40 MG PO TBEC
40.0000 mg | DELAYED_RELEASE_TABLET | Freq: Every day | ORAL | Status: DC
  Administered 2023-12-31 – 2024-01-01 (×2): 40 mg via ORAL
  Filled 2023-12-30 (×2): qty 1

## 2023-12-30 MED ORDER — TIZANIDINE HCL 4 MG PO TABS
4.0000 mg | ORAL_TABLET | Freq: Every day | ORAL | Status: DC
  Administered 2023-12-30 – 2023-12-31 (×2): 4 mg via ORAL
  Filled 2023-12-30 (×2): qty 1

## 2023-12-30 MED ORDER — ONDANSETRON HCL 4 MG PO TABS: 4.0000 mg | ORAL_TABLET | Freq: Four times a day (QID) | ORAL | Status: DC | PRN

## 2023-12-30 MED ORDER — FERROUS SULFATE 325 (65 FE) MG PO TABS
325.0000 mg | ORAL_TABLET | Freq: Every day | ORAL | Status: DC
  Administered 2023-12-30 – 2024-01-01 (×3): 325 mg via ORAL
  Filled 2023-12-30 (×3): qty 1

## 2023-12-30 MED ORDER — OXYCODONE HCL 5 MG PO TABS
20.0000 mg | ORAL_TABLET | Freq: Every day | ORAL | Status: DC
  Administered 2023-12-31 – 2024-01-01 (×5): 20 mg via ORAL
  Filled 2023-12-30 (×8): qty 4

## 2023-12-30 MED ORDER — TRAZODONE HCL 50 MG PO TABS: 25.0000 mg | ORAL_TABLET | Freq: Every evening | ORAL | Status: DC | PRN

## 2023-12-30 MED ORDER — PIPERACILLIN-TAZOBACTAM 3.375 G IVPB 30 MIN
3.3750 g | Freq: Once | INTRAVENOUS | Status: AC
  Administered 2023-12-30: 3.375 g via INTRAVENOUS
  Filled 2023-12-30: qty 50

## 2023-12-30 MED ORDER — NICOTINE 14 MG/24HR TD PT24
14.0000 mg | MEDICATED_PATCH | Freq: Every day | TRANSDERMAL | Status: DC
  Administered 2023-12-31 – 2024-01-01 (×2): 14 mg via TRANSDERMAL
  Filled 2023-12-30 (×2): qty 1

## 2023-12-30 MED ORDER — SODIUM CHLORIDE 0.9 % IV SOLN: INTRAVENOUS | Status: DC

## 2023-12-30 MED ORDER — FLUTICASONE FUROATE-VILANTEROL 100-25 MCG/ACT IN AEPB
1.0000 | INHALATION_SPRAY | Freq: Every day | RESPIRATORY_TRACT | Status: DC
  Administered 2023-12-31 – 2024-01-01 (×2): 1 via RESPIRATORY_TRACT
  Filled 2023-12-30: qty 28

## 2023-12-30 MED ORDER — HYDROXYZINE HCL 10 MG PO TABS: 10.0000 mg | ORAL_TABLET | Freq: Three times a day (TID) | ORAL | Status: DC | PRN

## 2023-12-30 MED ORDER — HEPARIN SODIUM (PORCINE) 5000 UNIT/ML IJ SOLN
5000.0000 [IU] | Freq: Three times a day (TID) | INTRAMUSCULAR | Status: DC
  Administered 2023-12-30 – 2024-01-01 (×6): 5000 [IU] via SUBCUTANEOUS
  Filled 2023-12-30 (×6): qty 1

## 2023-12-30 MED ORDER — DONEPEZIL HCL 5 MG PO TABS
10.0000 mg | ORAL_TABLET | Freq: Every day | ORAL | Status: DC
  Administered 2023-12-30 – 2023-12-31 (×2): 10 mg via ORAL
  Filled 2023-12-30 (×2): qty 2

## 2023-12-30 MED ORDER — DAPAGLIFLOZIN PROPANEDIOL 10 MG PO TABS
10.0000 mg | ORAL_TABLET | Freq: Every day | ORAL | Status: DC
  Filled 2023-12-30: qty 1

## 2023-12-30 MED ORDER — DIVALPROEX SODIUM ER 250 MG PO TB24
1000.0000 mg | ORAL_TABLET | Freq: Every day | ORAL | Status: DC
  Administered 2023-12-30 – 2024-01-01 (×3): 1000 mg via ORAL
  Filled 2023-12-30 (×3): qty 4

## 2023-12-30 MED ORDER — ROSUVASTATIN CALCIUM 20 MG PO TABS
20.0000 mg | ORAL_TABLET | Freq: Every day | ORAL | Status: DC
  Administered 2023-12-30 – 2023-12-31 (×2): 20 mg via ORAL
  Filled 2023-12-30 (×2): qty 1

## 2023-12-30 MED ORDER — ACETAMINOPHEN 325 MG PO TABS: 650.0000 mg | ORAL_TABLET | Freq: Four times a day (QID) | ORAL | Status: DC | PRN

## 2023-12-30 MED ORDER — ONDANSETRON HCL 4 MG/2ML IJ SOLN
4.0000 mg | Freq: Four times a day (QID) | INTRAMUSCULAR | Status: DC | PRN
Start: 2023-12-30 — End: 2024-01-01

## 2023-12-30 MED ORDER — ALBUTEROL SULFATE (2.5 MG/3ML) 0.083% IN NEBU: 2.5000 mg | INHALATION_SOLUTION | Freq: Four times a day (QID) | RESPIRATORY_TRACT | Status: DC | PRN

## 2023-12-30 NOTE — ED Notes (Signed)
 Pt's IV is positional therefor fluids are taking longer to infuse. Will repeat lactic once fluids are complete.

## 2023-12-30 NOTE — ED Notes (Signed)
 Pt's daughter called and spoke with pt around 0600 and requested to talk to RN but RN unavailable. Pt's daughter called back by this RN now but no answer

## 2023-12-30 NOTE — ED Notes (Signed)
 ED Provider at bedside.

## 2023-12-30 NOTE — ED Notes (Signed)
 Pt to radiology at this time.

## 2023-12-30 NOTE — ED Notes (Signed)
 Pt fluid placed on pressure bags. Pt has urinal, no urge to urinate. Pt states his urge to have BM has subsided after relieving himself in brief and pants earlier. Pt was given supplies to clean self and provided w/ gown.

## 2023-12-30 NOTE — Sepsis Progress Note (Addendum)
Elink monitoring for the code sepsis protocol.   Notified provider of need to order 3rd lactic acid.

## 2023-12-30 NOTE — Assessment & Plan Note (Signed)
On  Farxiga

## 2023-12-30 NOTE — Assessment & Plan Note (Signed)
On oxycodone

## 2023-12-30 NOTE — Assessment & Plan Note (Addendum)
 Current ammonia level is normal.  Hold off on lactulose.  Sometimes this can happen with Depakote.

## 2023-12-30 NOTE — ED Notes (Signed)
 Walked in to straight cath pt and urine was sitting on bedside.  Urine labeled and sent to lab. Ward, DO made aware,

## 2023-12-30 NOTE — TOC Initial Note (Signed)
 Transition of Care [facility]) - Initial/Assessment Note    Patient Details  Name: Albert Figueroa MRN: [id number] Date of Birth: [birth year]  [facility] CM/SW Contact:    Colin Broach, LCSW Phone Number: 12/30/2023, 12:01 PM  Clinical Narrative:  Per chart review, patient recently discharged from [facility] on 3/11.  Patient went to SNF.  Patient's PCP is Marisue Ivan, MD.  He uses [facility] pharmacy for his medications.  TOC to continue to follow for discharge planning needs.  PT eval pending.               Patient Goals and CMS Choice            Expected Discharge Plan and Services                                              Prior Living Arrangements/Services                       Activities of Daily Living   ADL Screening (condition at time of admission) Independently performs ADLs?: No Does the patient have a NEW difficulty with bathing/dressing/toileting/self-feeding that is expected to last >3 days?: No Does the patient have a NEW difficulty with getting in/out of bed, walking, or climbing stairs that is expected to last >3 days?: No Does the patient have a NEW difficulty with communication that is expected to last >3 days?: No Is the patient deaf or have difficulty hearing?: No Does the patient have difficulty seeing, even when wearing glasses/contacts?: No Does the patient have difficulty concentrating, remembering, or making decisions?: No  Permission Sought/Granted                  Emotional Assessment              Admission diagnosis:  Sepsis (HCC) [A41.9] Patient Active Problem List   Diagnosis Date Noted   Diarrhea 12/30/2023   Hyperammonemia (HCC) 12/30/2023   Weakness 12/30/2023   Acute kidney injury superimposed on CKD (HCC) 12/10/2023   Acute hepatic encephalopathy (HCC) 12/10/2023   Hypernatremia 12/10/2023   Type 2 diabetes mellitus with chronic kidney disease, without long-term current use of insulin  (HCC) 12/10/2023   Dementia with behavioral disturbance (HCC) 12/10/2023   Dehydration 03/05/2023   Acute renal failure superimposed on stage 4 chronic kidney disease (HCC) 03/04/2023   Seizure (HCC) 03/04/2023   Other pancytopenia (HCC) 09/06/2022   Hypoglycemia 09/03/2022   Acute metabolic encephalopathy 09/02/2022   Blepharitis of right lower eyelid 09/02/2022   Dyslipidemia 09/02/2022   Pulmonary hypertension (HCC) 09/02/2022   Depression with anxiety 09/02/2022   GERD without esophagitis 09/02/2022   Lactic acidosis 09/02/2022   Acute kidney injury superimposed on stage IIIb chronic kidney disease (HCC) 09/01/2022   Intractable pain 07/05/2022   Type 2 diabetes mellitus with renal complication (HCC) 07/05/2022   Over weight 07/05/2022   Thrombocytopenia (HCC) 07/05/2022   Fall at home, initial encounter 07/05/2022   Cellulitis of right hand 07/05/2022   COPD (chronic obstructive pulmonary disease) (HCC) 07/05/2022   Iron deficiency anemia 07/05/2022   Toxic encephalopathy 07/05/2022   Neuropathy 07/05/2022   Right wrist pain    Chronic pain syndrome 01/01/2021   Pharmacologic therapy 01/01/2021   Disorder of skeletal system 01/01/2021   Problems influencing health status 01/01/2021   Aortic atherosclerosis (  HCC) 02/24/2020   Other emphysema (HCC) 02/24/2020   Medicare annual wellness visit, initial 02/03/2020   Neck pain 09/15/2018   Sacroiliac joint pain 09/08/2018   Lumbar spondylosis 06/11/2018   Left hip pain 06/11/2018   Chronic pain of both knees 06/11/2018   CKD (chronic kidney disease) stage 4, GFR 15-29 ml/min (HCC) 10/29/2017   Obesity (BMI 30-39.9) 11/27/2015   Tobacco use 06/13/2015   Essential hypertension 06/13/2015   Hyperlipidemia, unspecified 06/13/2015   Osteoarthritis 06/13/2015   Biceps tendinitis 04/06/2015   Epilepsy without status epilepticus, not intractable (HCC) 12/10/2013   PCP:  Marisue Ivan, MD Pharmacy:   [facility]  [phone] - [place], [place] - [address] AT [facility] [address] [place] [address] [place] [address] Phone: [phone] Fax: [phone]     Social Drivers of Health (SDOH) Social History: SDOH Screenings   Food Insecurity: No Food Insecurity (12/30/2023)  Recent Concern: Food Insecurity - Food Insecurity Present (10/06/2023)   Received from [facility]  Housing: Low Risk  (12/30/2023)  Transportation Needs: No Transportation Needs (12/30/2023)  Utilities: Not At Risk (12/30/2023)  Financial Resource Strain: High Risk (10/06/2023)   Received from [facility]  Social Connections: Unknown (12/30/2023)  Tobacco Use: High Risk (12/30/2023)   SDOH Interventions:     Readmission Risk Interventions    12/30/2023   12:01 PM 03/06/2023    3:23 PM  Readmission Risk Prevention Plan  Transportation Screening Complete Complete  PCP or Specialist Appt within 3-5 Days Complete Complete  HRI or Home Care Consult  --  Social Work Consult for Recovery Care Planning/Counseling Complete Complete  Palliative Care Screening Not Applicable Not Applicable  Medication Review Oceanographer) Complete Complete

## 2023-12-30 NOTE — Assessment & Plan Note (Signed)
Secondary to sepsis with systolic in the 90s Hold antihypertensives and resume when appropriate  

## 2023-12-30 NOTE — Assessment & Plan Note (Signed)
 Hold lactulose.  Could be norovirus since he was in a facility and other patients over there were having similar symptoms.  Send off stool studies and C. difficile.  Hold off on further antibiotics.  I do not think this is sepsis.

## 2023-12-30 NOTE — ED Notes (Signed)
 Spoke w/ pt's daughter Konrad Felix who was updated on pt's care plan w/ pt permission. She aggress w/ plan of care. She states, "pt does not like hospitals and will say anything to get out"; daughter would like to be called w/ any updates or concerns.

## 2023-12-30 NOTE — Progress Notes (Signed)
 OT Cancellation Note  Patient Details Name: Albert Figueroa MRN: [id number] DOB: 1955/11/18   Cancelled Treatment:    Reason Eval/Treat Not Completed: Other (comment);Patient at procedure or test/ unavailable. Orders received, chart reviewed. PT at bedside, pt unavailable. OT will re-attempt as able to perform eval.  Victorino Dike L. Chayson Charters, OTR/L  12/30/23, 2:03 PM

## 2023-12-30 NOTE — ED Notes (Signed)
 Pt given water per request

## 2023-12-30 NOTE — H&P (Signed)
 History and Physical    Patient: Albert Figueroa:[id number] DOB: October 07, 1955 DOA: 12/29/2023 DOS: the patient was seen and examined on 12/30/2023 PCP: Marisue Ivan, MD  Patient coming from: Home  Chief Complaint:  Chief Complaint  Patient presents with   Diarrhea   HPI: Albert Figueroa is a 68 y.o. male with medical history significant of recent hospitalization for acute kidney injury and hyperammonemia.  Patient was started on lactulose.  Patient also takes Depakote.  Patient went into rehab and was having lots of diarrhea.  He stated a lot of people over there were also having diarrhea.  He had some abdominal pain.  No bowel movement since coming into the hospital.  No nausea or vomiting.  Patient states he has not eaten in a couple days.  Has had a lot of weight loss from 202 down to 117.  No fever chills or sweats.  Found to have an elevated lactic acid and elevated creatinine.  Hospitalist services contacted for admission. Review of Systems: Review of Systems  Constitutional:  Negative for chills, fever, malaise/fatigue and weight loss.  HENT:  Negative for hearing loss.   Eyes:  Negative for blurred vision.  Respiratory:  Negative for cough and shortness of breath.   Cardiovascular:  Negative for chest pain.  Gastrointestinal:  Positive for abdominal pain and diarrhea. Negative for nausea and vomiting.  Genitourinary:  Negative for dysuria.  Musculoskeletal:  Negative for myalgias.  Skin:  Negative for rash.  Neurological:  Negative for dizziness.  Endo/Heme/Allergies:  Does not bruise/bleed easily.  Psychiatric/Behavioral:  Negative for depression.     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 in the 1980s"   Osteoarthrosis    Seizure (HCC)    none since 2009   Stage 4 chronic kidney disease (HCC)    Tremor of both hands    Past Surgical History:  Procedure Laterality Date   CATARACT EXTRACTION W/PHACO Right 05/14/2023   Procedure: CATARACT EXTRACTION PHACO AND INTRAOCULAR LENS PLACEMENT (IOC) RIGHT DIABETIC;  Surgeon: Lockie Mola, MD;  Location: [facility];  Service: Ophthalmology;  Laterality: Right;  6.70 0:39.8   CATARACT EXTRACTION W/PHACO Left 05/28/2023   Procedure: CATARACT EXTRACTION PHACO AND INTRAOCULAR LENS PLACEMENT (IOC) LEFT DIABETIC 6.34 00:37.1;  Surgeon: Lockie Mola, MD;  Location: [facility];  Service: Ophthalmology;  Laterality: Left;   CSF SHUNT     placed in 1980s   KNEE SURGERY     multiple surgeries on both knees   Social History:  reports that he has been smoking cigarettes. He has a 96 pack-year smoking history. He has never used smokeless tobacco. He reports that he does not drink alcohol and does not use drugs.  Allergies  Allergen Reactions   Gabapentin Other (See Comments)    Lethargic, Confused   Amlodipine     Other reaction(s): Other (see comments) Caused elevated blood pressure and headaches   Ozempic (0.25 Or 0.5 Mg-Dose) [Semaglutide(0.25 Or 0.5mg -Dos)]  Lethargy.  Also dropped blood sugars too low    Family History  Problem Relation Age of Onset   Diabetes Mother    Cancer Mother    CAD Father     Prior to Admission medications   Medication Sig Start Date End Date Taking? Authorizing Provider  acetaminophen (TYLENOL) 325 MG tablet Take 2 tablets (650 mg total) by mouth every 6 (six) hours as needed for mild pain (or Fever >/= 101). 09/07/22   Hollice Espy, MD  albuterol (VENTOLIN HFA) 108 (90 Base) MCG/ACT inhaler Inhale into the lungs every 6 (six) hours as needed for wheezing or shortness  of breath.    [provider]  Cholecalciferol (VITAMIN D-1000 MAX ST) 25 MCG (1000 UT) tablet Take by mouth. 09/26/23   [provider]  dapagliflozin propanediol (FARXIGA) 10 MG TABS tablet Take 10 mg by mouth daily.    [provider]  dexlansoprazole (DEXILANT) 60 MG capsule Take 60 mg by mouth daily.    [provider]  divalproex (DEPAKOTE ER) 500 MG 24 hr tablet Take 1,000 mg by mouth in the morning and at bedtime.    [provider]  donepezil (ARICEPT ODT) 10 MG disintegrating tablet Take 10 mg by mouth at bedtime.    [provider]  fenofibrate 160 MG tablet Take 160 mg by mouth daily. 04/25/22   [provider]  ferrous sulfate 325 (65 FE) MG tablet Take 325 mg by mouth daily with breakfast.    [provider]  Fluticasone-Umeclidin-Vilant (TRELEGY ELLIPTA) 100-62.5-25 MCG/ACT AEPB Inhale into the lungs daily.    [provider]  hydrOXYzine (ATARAX) 10 MG tablet Take 10 mg by mouth 3 (three) times daily as needed.    [provider]  lactulose (CHRONULAC) 10 GM/15ML solution Take 45 mLs (30 g total) by mouth 3 (three) times daily. 12/24/23   Jonah Blue, MD  naloxone [facility]) nasal spray 4 mg/0.1 mL Place 1 spray into the nose once. 11/29/22   [provider]  Oxycodone HCl 20 MG TABS Take 1 tablet (20 mg total) by mouth 5 (five) times daily. 12/24/23   Jonah Blue, MD  Roflumilast 250 MCG TABS Take 1 tablet by mouth daily.    [provider]  rosuvastatin (CRESTOR) 20 MG tablet Take 20 mg by mouth at bedtime. 04/08/22   [provider]  tiZANidine (ZANAFLEX) 4 MG capsule Take 4 mg by mouth at bedtime.    [provider]  traZODone (DESYREL) 50 MG tablet Take 0.5 tablets (25 mg total) by mouth at bedtime as needed for sleep. 12/24/23   Jonah Blue, MD    Physical Exam: Vitals:   12/30/23 0211 12/30/23 0430 12/30/23 0530 12/30/23 0630  BP: 111/68 109/62  (!) 102/58 (!) 116/98  Pulse: 100 80 77 75  Resp:  16 16 16   Temp:  98.3 F (36.8 C)    TempSrc:      SpO2: 100% 100% 100%   Weight:      Height:       Physical Exam HENT:     Head: Normocephalic.     Mouth/Throat:     Pharynx: No oropharyngeal exudate.  Eyes:     General: Lids are normal.     Conjunctiva/sclera: Conjunctivae normal.  Cardiovascular:     Rate and Rhythm: Normal rate and regular rhythm.     Heart sounds: Normal heart sounds, S1 normal and S2 normal.  Pulmonary:     Breath sounds:  No decreased breath sounds, wheezing, rhonchi or rales.  Abdominal:     Palpations: Abdomen is soft.     Tenderness: There is no abdominal tenderness.  Musculoskeletal:     Right lower leg: No swelling.     Left lower leg: No swelling.  Skin:    General: Skin is warm.     Findings: No rash.  Neurological:     Mental Status: He is alert and oriented to person, place, and time.     Data Reviewed: CT abdomen and pelvis showed no acute findings in the abdomen and pelvis and liver unremarkable CT scan of the head showed no acute intracranial abnormality right occipital approach shunt catheter terminating in the right lateral ventricle White blood count 8.6, hemoglobin 11.9, platelet count 121 Creatinine 2.71, CO2 16, ammonia 22 Depakote level 76  Assessment and Plan: * Diarrhea Hold lactulose.  Could be norovirus since he was in a facility and other patients over there were having similar symptoms.  Send off stool studies and C. difficile.  Hold off on further antibiotics.  I do not think this is sepsis.  Acute kidney injury superimposed on CKD (HCC) Acute kidney injury on CKD stage IIIb.  Creatinine up at 2.71 and last discharge was 2.25.  IV fluid hydration.  Lactic acidosis IV fluid hydration  Chronic pain syndrome On oxycodone  Hyperammonemia (HCC) Current ammonia level is normal.  Hold off on lactulose.  Sometimes this can happen with Depakote.  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  Hyperlipidemia, unspecified Continue statin      Advance Care Planning:   Code Status: Full Code   Consults: None  Family Communication: Updated daughter on the phone  Severity of Illness: The appropriate patient status for this patient is INPATIENT. Inpatient status is judged to be reasonable and necessary in order to provide the required intensity of service to ensure the patient's safety. The patient's presenting symptoms, physical exam findings, and initial radiographic and laboratory data in the context of their chronic comorbidities is felt to place them at high risk for further clinical deterioration. Furthermore, it is not anticipated that the patient will be medically stable for discharge from the hospital within 2 midnights of admission.   * I certify that at the point of admission it is my clinical judgment that the patient will require inpatient hospital care spanning beyond 2 midnights from the point of admission due to high intensity of service, high risk for further deterioration and high frequency of surveillance required.*  Author: Alford Highland, MD 12/30/2023 8:25 AM  For on call review [email or web address].

## 2023-12-30 NOTE — Assessment & Plan Note (Signed)
 Continue to monitor

## 2023-12-30 NOTE — ED Notes (Signed)
 Bladder scan shows 0 ML of urine in bladder. Pt aware of need for ua. Urinal at bedside

## 2023-12-30 NOTE — Assessment & Plan Note (Signed)
 Continue statin.

## 2023-12-30 NOTE — Assessment & Plan Note (Signed)
 Acute kidney injury on CKD stage IIIb.  Creatinine up at 2.71 and last discharge was 2.25.  IV fluid hydration.

## 2023-12-30 NOTE — Assessment & Plan Note (Signed)
PT and OT evaluations. ?

## 2023-12-30 NOTE — Evaluation (Addendum)
 Physical Therapy Evaluation Patient Details Name: Albert Figueroa MRN: [id number] DOB: [birth year] Today's Date: 12/30/2023  History of Present Illness  Albert Figueroa is a 41JWJ who comes to ED less than 24hours after DC from STR back to home, pt having repeated diarrhea. Pt was at [facility] after DC from here in early march, s/p fall at home 12/08/23. 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 Tests (+) for toxigenic c. diff and norovirus.  Clinical Impression  Pt seen in ED bed, alert, interactive, answers questions well, but then later becomes more tangential and difficult to keep on task. Pt reports he was at the level of AMB around facility ad lib with RW prior to leaving STR for home, this would mean a big improvement in capacity for AMB. Today, pt continues to have loose stools and feel quite poorly, however he is able to demonstrate bed mobility and transfer with some mild weakness, however AMB is deferred for safety. Will continue to follow.       If plan is discharge home, recommend the following: A little help with walking and/or transfers;A little help with bathing/dressing/bathroom;Assistance with cooking/housework;Direct supervision/assist for medications management;Direct supervision/assist for financial management;Assist for transportation;Help with stairs or ramp for entrance;Supervision due to cognitive status   Can travel by private vehicle   Yes    Equipment Recommendations None recommended by PT  Recommendations for Other Services       Functional Status Assessment Patient has had a recent decline in their functional status and demonstrates the ability to make significant improvements in function in a reasonable and predictable amount of time.     Precautions / Restrictions Precautions Precautions: Fall Recall of  Precautions/Restrictions: Impaired Restrictions Weight Bearing Restrictions Per Provider Order: No      Mobility  Bed Mobility Overal bed mobility: Needs Assistance Bed Mobility: Supine to Sit     Supine to sit: Supervision     General bed mobility comments: labored, poorly controlled, but no frank physical assist is needed    Transfers Overall transfer level: Needs assistance Equipment used: Rolling walker (2 wheels) Transfers: Sit to/from Stand Sit to Stand: Supervision           General transfer comment: moderate effort, 1 false start, then rises without adeuqate balance to RW.    Ambulation/Gait Ambulation/Gait assistance:  (sidesteps along EOB, but farther AMB deferred due to difficulty following commands.)                Stairs            Wheelchair Mobility     Tilt Bed    Modified Rankin (Stroke Patients Only)       Balance                                             Pertinent Vitals/Pain Pain Assessment Pain Assessment: No/denies pain    Home Living Family/patient expects to be discharged to:: Private residence Living Arrangements: Alone Available Help at Discharge: Family;Available PRN/intermittently;Friend(s) Type of Home:  (duplex) Home Access: Stairs to enter   Entrance Stairs-Number of Steps: 3 stairs at back entrance   Home Layout: One level Home Equipment: Agricultural consultant (2 wheels);Cane - single point;Shower seat      Prior  Function                       Extremity/Trunk Assessment                Communication        Cognition Arousal: Alert Behavior During Therapy: WFL for tasks assessed/performed, Impulsive   PT - Cognitive impairments: History of cognitive impairments                       PT - Cognition Comments: Pt is severely tangential at times changing topics a with each response or multiple times within a single response.   Following commands impaired:  Follows one step commands with increased time     Cueing       General Comments      Exercises     Assessment/Plan    PT Assessment Patient needs continued PT services  PT Problem List Decreased strength;Decreased activity tolerance;Decreased balance;Decreased mobility;Decreased cognition       PT Treatment Interventions DME instruction;Gait training;Stair training;Functional mobility training;Therapeutic exercise;Therapeutic activities;Balance training;Neuromuscular re-education;Cognitive remediation;Patient/family education    PT Goals (Current goals can be found in the Care Plan section)  Acute Rehab PT Goals PT Goal Formulation: Patient unable to participate in goal setting    Frequency Min 3X/week     Co-evaluation               AM-PAC PT "6 Clicks" Mobility  Outcome Measure Help needed turning from your back to your side while in a flat bed without using bedrails?: A Little Help needed moving from lying on your back to sitting on the side of a flat bed without using bedrails?: A Little Help needed moving to and from a bed to a chair (including a wheelchair)?: A Little Help needed standing up from a chair using your arms (e.g., wheelchair or bedside chair)?: A Little Help needed to walk in hospital room?: A Little Help needed climbing 3-5 steps with a railing? : A Little 6 Click Score: 18    End of Session   Activity Tolerance: Patient tolerated treatment well;No increased pain Patient left: with call bell/phone within reach;in bed;with bed alarm set Nurse Communication: Mobility status PT Visit Diagnosis: Unsteadiness on feet (R26.81);Other abnormalities of gait and mobility (R26.89);Muscle weakness (generalized) (M62.81);History of falling (Z91.81);Difficulty in walking, not elsewhere classified (R26.2)    Time: 1610-9604 PT Time Calculation (min) (ACUTE ONLY): 18 min   Charges:   PT Evaluation $PT Eval Moderate Complexity: 1 Mod   PT General  Charges $$ ACUTE PT VISIT: 1 Visit        4:47 PM, 12/30/23 Rosamaria Lints, PT, DPT Physical Therapist - [facility]  [phone] (ASCOM)    [place] Festa C 12/30/2023, 4:44 PM

## 2023-12-30 NOTE — Progress Notes (Signed)
 CODE SEPSIS - PHARMACY COMMUNICATION  **Broad Spectrum Antibiotics should be administered within 1 hour of Sepsis diagnosis**  Time Code Sepsis Called/Page Received: 1191   Antibiotics Ordered: Zosyn  Time of 1st antibiotic administration: 0432  Otelia Sergeant, PharmD, MBA 12/30/2023 4:20 AM

## 2023-12-31 DIAGNOSIS — A09 Infectious gastroenteritis and colitis, unspecified: Secondary | ICD-10-CM | POA: Diagnosis not present

## 2023-12-31 LAB — BASIC METABOLIC PANEL WITH GFR
Anion gap: 8 (ref 5–15)
BUN: 37 mg/dL — ABNORMAL HIGH (ref 8–23)
CO2: 18 mmol/L — ABNORMAL LOW (ref 22–32)
Calcium: 7.8 mg/dL — ABNORMAL LOW (ref 8.9–10.3)
Chloride: 113 mmol/L — ABNORMAL HIGH (ref 98–111)
Creatinine, Ser: 2.22 mg/dL — ABNORMAL HIGH (ref 0.61–1.24)
GFR, Estimated: 32 mL/min — ABNORMAL LOW (ref >60)
Glucose, Bld: 108 mg/dL — ABNORMAL HIGH (ref 70–99)
Potassium: 3.6 mmol/L (ref 3.5–5.1)
Sodium: 139 mmol/L (ref 135–145)

## 2023-12-31 LAB — MAGNESIUM: Magnesium: 2.1 mg/dL (ref 1.7–2.4)

## 2023-12-31 LAB — PHOSPHORUS: Phosphorus: 3.6 mg/dL (ref 2.5–4.6)

## 2023-12-31 LAB — VITAMIN D 25 HYDROXY (VIT D DEFICIENCY, FRACTURES): Vit D, 25-Hydroxy: 24.3 ng/mL — ABNORMAL LOW (ref 30–100)

## 2023-12-31 MED ORDER — SACCHAROMYCES BOULARDII 250 MG PO CAPS
250.0000 mg | ORAL_CAPSULE | Freq: Two times a day (BID) | ORAL | Status: DC
Start: 2023-12-31 — End: 2024-01-01
  Administered 2023-12-31 – 2024-01-01 (×3): 250 mg via ORAL
  Filled 2023-12-31 (×4): qty 1

## 2023-12-31 MED ORDER — SODIUM BICARBONATE 8.4 % IV SOLN
100.0000 meq | Freq: Once | INTRAVENOUS | Status: AC
  Administered 2023-12-31: 100 meq via INTRAVENOUS
  Filled 2023-12-31: qty 100

## 2023-12-31 NOTE — Plan of Care (Signed)

## 2023-12-31 NOTE — Progress Notes (Signed)
 PT Cancellation Note  Patient Details Name: Albert Figueroa MRN: [id number] DOB: [birth year]  Pt reports he is moving well today, has AMB a couple times with other staff. Pt declines a skilled PT session at this time as he would like to save his reserves for DC later today. Pt is confident in the mobility he needs to perform for safe DC back to home.   2:27 PM, 12/31/23 Rosamaria Lints, PT, DPT Physical Therapist - [facility]  [phone] (ASCOM)    Albert Figueroa 12/31/2023, 2:22 PM

## 2023-12-31 NOTE — Progress Notes (Signed)
 [facility] Progress Note  Patient: Albert Figueroa    ZOX:[id number]  DOA: 12/29/2023     Date of Service: the patient was seen and examined on 12/31/2023  Chief Complaint  Patient presents with   Diarrhea   Brief hospital course: Albert Figueroa is a 68 y.o. male with medical history significant of recent hospitalization for acute kidney injury and hyperammonemia.  Patient was started on lactulose.  Patient also takes Depakote.  Patient went into rehab and was having lots of diarrhea.  He stated a lot of people over there were also having diarrhea.  He had some abdominal pain.  No bowel movement since coming into the hospital.  No nausea or vomiting.  Patient states he has not eaten in a couple days.  Has had a lot of weight loss from 202 down to 117.  No fever chills or sweats.  Found to have an elevated lactic acid and elevated creatinine.   [facility] consulted for admission and further management as below   Assessment and Plan:  # Diarrhea secondary to norovirus infection  GI pathogen positive for norovirus Stool C. difficile antigen positive, toxin negative but toxigenic C. difficile by PCR is positive.  As per treatment protocol, no need to treat C. difficile. Continue IV fluid for hydration Started probiotics Continue symptomatic treatment  # AKI secondary to dehydration Creatinine 2.71----2.22 Continue IV fluid for hydration Monitor renal functions and urine output Avoid nephrotoxic medications   # Metabolic acidosis due to gastroenteritis Bicarb IV push 50 mEq given Monitor electrolytes  # Hypotension due to dehydration BP improved, continue to monitor  # Chronic pain syndrome On oxycodone   # Hyperammonemia Current ammonia level is normal.  Hold off on lactulose.  Sometimes this can happen with Depakote.   # Thrombocytopenia (HCC) Continue to monitor   # Type 2 diabetes mellitus with renal complication (HCC) On Farxiga   # Weakness PT and OT evaluations.   #  Hyperlipidemia, unspecified Continue statin    Body mass index is 23.85 kg/m.  Interventions:  Diet: Soft diet DVT Prophylaxis: Subcutaneous Heparin    Advance goals of care discussion: Full code  Family Communication: family was not present at bedside, at the time of interview.  The pt provided permission to discuss medical plan with the family. Opportunity was given to ask question and all questions were answered satisfactorily.  4/2 discussed with patient's son over the phone.  Disposition:  Pt is from home, admitted with norovirus gastroenteritis, dehydration and AKI, still has elevated creatinine on IV fluids, which precludes a safe discharge. Discharge to home, when stable, most likely in 1 to 2 days.  Subjective: No significant events overnight, patient is tolerating diet well, denies any abdominal cramps, no nausea vomiting.  Denied any diarrhea since admission. Patient would like to go home but he still has a decreased urine output, creatinine is elevated so advised to stay overnight for IV hydration and we will monitor renal functions and discharge plan tomorrow a.m. Plan discussed with patient's son over the phone as well, he verbalized understanding.  Patient's himself does not understand well and wanted to be discharged in the afternoon.  Physical Exam: General: NAD, lying comfortably Appear in no distress, affect appropriate Eyes: PERRLA ENT: Oral Mucosa Clear, moist  Neck: no JVD,  Cardiovascular: S1 and S2 Present, no Murmur,  Respiratory: good respiratory effort, Bilateral Air entry equal and Decreased, no Crackles, no wheezes Abdomen: Bowel Sound present, Soft and no tenderness,  Skin: no rashes Extremities: no Pedal edema, no calf tenderness Neurologic: without any new focal findings Gait not checked due to patient safety concerns  Vitals:   12/30/23 1533 12/30/23 2140 12/31/23 0428 12/31/23 0752  BP: 102/69 91/62 (!) 87/55 (!) 104/59  Pulse: 73 64 99 (!)  51  Resp: 15 18 20 16   Temp: 98.1 F (36.7 C) 98.6 F (37 C) 98.2 F (36.8 C) 98 F (36.7 C)  TempSrc:   Oral Oral  SpO2: 99% 100% 99% 99%  Weight:      Height:        Intake/Output Summary (Last 24 hours) at 12/31/2023 1311 Last data filed at 12/31/2023 1003 Gross per 24 hour  Intake 2161.67 ml  Output 400 ml  Net 1761.67 ml   Filed Weights   12/29/23 2114  Weight: 77.6 kg    Data Reviewed: I have personally reviewed and interpreted daily labs, tele strips, imagings as discussed above. I reviewed all nursing notes, pharmacy notes, vitals, pertinent old records I have discussed plan of care as described above with RN and patient/family.  CBC: Recent Labs  Lab 12/29/23 2125  WBC 8.6  HGB 11.9*  HCT 36.8*  MCV 104.2*  PLT 121*   Basic Metabolic Panel: Recent Labs  Lab 12/29/23 2125 12/30/23 0910 12/31/23 0146  NA 142 140 139  K 4.0 4.2 3.6  CL 114* 117* 113*  CO2 16* 16* 18*  GLUCOSE 144* 89 108*  BUN 44* 38* 37*  CREATININE 2.71* 2.46* 2.22*  CALCIUM 9.0 8.0* 7.8*  MG  --   --  2.1  PHOS  --   --  3.6    Studies: No results found.  Scheduled Meds:  divalproex  1,000 mg Oral Daily   donepezil  10 mg Oral QHS   ferrous sulfate  325 mg Oral Q breakfast   fluticasone furoate-vilanterol  1 puff Inhalation Daily   And   umeclidinium bromide  1 puff Inhalation Daily   heparin  5,000 Units Subcutaneous Q8H   nicotine  14 mg Transdermal Daily   oxyCODONE  20 mg Oral 5 X Daily   pantoprazole  40 mg Oral Daily   roflumilast  250 mcg Oral Daily   rosuvastatin  20 mg Oral QHS   saccharomyces boulardii  250 mg Oral BID   tiZANidine  4 mg Oral QHS   Continuous Infusions:  sodium chloride 100 mL/hr at 12/31/23 1111   PRN Meds: acetaminophen, albuterol, hydrOXYzine, ondansetron **OR** ondansetron (ZOFRAN) IV, traZODone  Time spent: 55 minutes  Author: Gillis Santa. MD [facility] 12/31/2023 1:11 PM  To reach On-call, see care teams to locate the  attending and reach out to them via [email or web address]. If 7PM-7AM, please contact night-coverage If you still have difficulty reaching the attending provider, please page the [facility] (Director on Call) for [facility] on amion for assistance.

## 2023-12-31 NOTE — Evaluation (Signed)
 Occupational Therapy Evaluation Patient Details Name: Albert Figueroa MRN: [id number] DOB: June 11, 1956 Today's Date: 12/31/2023   History of Present Illness   Albert Figueroa is a 89AOZ who comes to ED less than 24hours after DC from [facility] back to home, pt having repeated diarrhea. Pt was at [facility] [place] after DC from here in early march, s/p fall at home 12/08/23. 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 Tests (+) for toxigenic c. diff and norovirus.     Clinical Impressions Pt received in bed, poor historian for PLOF and home setup. Working with care team to verify. Pt states he can perform ADLs MOD I with RW, and tells this Thereasa Parkin he does not drive per PCP recommendations due to his dementia. Pt limited by poor functional cognition and deficits present in executive functioning, specifically the areas of task initiation, problem-solving and reasoning. Requires max multimodal cuing for all task performance as pt is verbose throughout. Pt able to perform bed mobility with supervision, STS transfers with CGA using RW from reg height bed, step pivot to recliner, and completes ~10 ft functional mobility using RW. Pt dismissive of OT attempts to educate on hand placement, and instead perseverates on pulling on RW to stand with multiple failed trials. Once pt corrects hand placement, is able to rise easily. Pt is a risk for falls due to poor cognition & impulsivity vs balance deficits. Due to the deficits present, anticipate pt unable to safely perform higher level IADL management in tasks such as med mgmt, finances, grocery shopping, and meal prep. Pt will require +1 supervision for all ADL performance, and assist for all IADL activities.   Pt would benefit from skilled OT services to address noted impairments and functional limitations (see below for  any additional details) in order to maximize safety and independence while minimizing falls risk and caregiver burden. Anticipate the need for follow up [facility] OT services upon acute hospital DC.      If plan is discharge home, recommend the following:   Supervision due to cognitive status;Help with stairs or ramp for entrance;Direct supervision/assist for financial management;Direct supervision/assist for medications management;Assistance with cooking/housework;A little help with walking and/or transfers;A little help with bathing/dressing/bathroom;Assist for transportation     Functional Status Assessment   Patient has had a recent decline in their functional status and demonstrates the ability to make significant improvements in function in a reasonable and predictable amount of time.     Equipment Recommendations   None recommended by OT      Precautions/Restrictions   Precautions Precautions: Fall Recall of Precautions/Restrictions: Impaired Restrictions Weight Bearing Restrictions Per Provider Order: No     Mobility Bed Mobility Overal bed mobility: Needs Assistance Bed Mobility: Supine to Sit     Supine to sit: Supervision Sit to supine: Contact guard assist        Transfers Overall transfer level: Needs assistance Equipment used: Rolling walker (2 wheels) Transfers: Sit to/from Stand Sit to Stand: Contact guard assist           General transfer comment: pt with multiple failed attempts at successfully performing STS from reg height bed due to pulling on RW and dismissing OT education for hand placement. Pt frusterated but when finally chooses to listen, is able to rise with CGA. Performs 4x STS transfers similarlly with very poor carryover, continuing to prefer to pull unsuccessfully  on RW      Balance Overall balance assessment: Needs assistance Sitting-balance support: Feet supported Sitting balance-Leahy Scale: Good     Standing balance support:  Bilateral upper extremity supported, During functional activity, Reliant on assistive device for balance Standing balance-Leahy Scale: Fair Standing balance comment: risk of falls due to impulsivity and cognition                           ADL either performed or assessed with clinical judgement   ADL Overall ADL's : Needs assistance/impaired                                     Functional mobility during ADLs: Cueing for sequencing;Cueing for safety;Minimal assistance;Rolling walker (2 wheels) General ADL Comments: Pt firmly refuses to participate in ADL tasks (offered oral care, grooming, bathing), instead agrees to mobilize.     Vision Baseline Vision/History: 1 Wears glasses Ability to See in Adequate Light: 0 Adequate Vision Assessment?: Wears glasses for reading;Wears glasses for driving            Pertinent Vitals/Pain Pain Assessment Pain Assessment: 0-10 Pain Score: 8  Pain Location: back, shoulder, neck. Pt states he is always at 8/10, cannot sleep or eat due to pain levels. Pain Descriptors / Indicators: Discomfort, Sore Pain Intervention(s): Limited activity within patient's tolerance, Monitored during session     Extremity/Trunk Assessment Upper Extremity Assessment Upper Extremity Assessment: Right hand dominant (MMT 4/5 MMT)   Lower Extremity Assessment Lower Extremity Assessment: Defer to PT evaluation   Cervical / Trunk Assessment Cervical / Trunk Assessment: Normal   Communication Communication Communication: No apparent difficulties Factors Affecting Communication:  (verbose, requires redirection to task attn)   Cognition Arousal: Alert Behavior During Therapy: WFL for tasks assessed/performed, Impulsive Cognition: History of cognitive impairments, Cognition impaired     Awareness: Intellectual awareness impaired Memory impairment (select all impairments): Short-term memory, Working memory Attention impairment (select  first level of impairment): Focused attention Executive functioning impairment (select all impairments): Initiation, Organization, Reasoning, Sequencing, Problem solving OT - Cognition Comments: Pt provides conflicting PLOF and home setup, verbose but redirectable throughout. Pt expresses irritation and frusteration with hospitalization and rehab stay                 Following commands: Intact Following commands impaired: Follows one step commands with increased time     Cueing  General Comments   Cueing Techniques: Verbal cues;Tactile cues;Visual cues              Home Living Family/patient expects to be discharged to:: Private residence Living Arrangements: Other relatives (lives with sister) Available Help at Discharge: Family;Available PRN/intermittently;Friend(s) Type of Home: House Home Access: Stairs to enter [employer] of Steps: 2 stairs at back entrance   Home Layout: One level     Bathroom Shower/Tub: Chief Strategy Officer: Standard Bathroom Accessibility: No   Home Equipment: Agricultural consultant (2 wheels);Cane - single point;Shower seat   Additional Comments: information provided is different from what pt told PT      Prior Functioning/Environment Prior Level of Function : Independent/Modified Independent             Mobility Comments: uses RW, pt states no falls but then states he has fallen in shower ADLs Comments: MOD I for ADL, son provides transportation to MD appts and grocery stores, is  not allowed to drive per PCP due to dementia dx    OT Problem List: Impaired balance (sitting and/or standing);Decreased cognition;Decreased safety awareness;Decreased knowledge of use of DME or AE;Decreased knowledge of precautions;Decreased activity tolerance   OT Treatment/Interventions: Self-care/ADL training;Therapeutic exercise;Neuromuscular education;Therapeutic activities;Patient/family education;Balance training;Energy  conservation;DME and/or AE instruction;Cognitive remediation/compensation      OT Goals(Current goals can be found in the care plan section)   Acute Rehab OT Goals OT Goal Formulation: Patient unable to participate in goal setting Time For Goal Achievement: 01/14/24 Potential to Achieve Goals: Good   OT Frequency:  Min 2X/week       AM-PAC OT "6 Clicks" Daily Activity     Outcome Measure Help from another person eating meals?: None Help from another person taking care of personal grooming?: None Help from another person toileting, which includes using toliet, bedpan, or urinal?: A Little Help from another person bathing (including washing, rinsing, drying)?: A Little Help from another person to put on and taking off regular upper body clothing?: A Little Help from another person to put on and taking off regular lower body clothing?: A Little 6 Click Score: 20   End of Session Equipment Utilized During Treatment: Gait belt;Rolling walker (2 wheels) Nurse Communication: Mobility status  Activity Tolerance: Patient tolerated treatment well Patient left: in bed;with call bell/phone within reach;with bed alarm set  OT Visit Diagnosis: Other symptoms and signs involving cognitive function;History of falling (Z91.81);Unsteadiness on feet (R26.81);Other abnormalities of gait and mobility (R26.89)                Time: 4540-9811 OT Time Calculation (min): 26 min Charges:  OT General Charges $OT Visit: 1 Visit OT Evaluation $OT Eval Low Complexity: 1 Low OT Treatments $Therapeutic Activity: 8-22 mins Neo Yepiz L. Elaijah Munoz, OTR/L  12/31/23, 2:46 PM

## 2024-01-01 DIAGNOSIS — A09 Infectious gastroenteritis and colitis, unspecified: Secondary | ICD-10-CM | POA: Diagnosis not present

## 2024-01-01 LAB — CBC
HCT: 23.6 % — ABNORMAL LOW (ref 39.0–52.0)
Hemoglobin: 7.5 g/dL — ABNORMAL LOW (ref 13.0–17.0)
MCH: 33.2 pg (ref 26.0–34.0)
MCHC: 31.8 g/dL (ref 30.0–36.0)
MCV: 104.4 fL — ABNORMAL HIGH (ref 80.0–100.0)
Platelets: 67 10*3/uL — ABNORMAL LOW (ref 150–400)
RBC: 2.26 MIL/uL — ABNORMAL LOW (ref 4.22–5.81)
RDW: 13.8 % (ref 11.5–15.5)
WBC: 3.1 10*3/uL — ABNORMAL LOW (ref 4.0–10.5)
nRBC: 0 % (ref 0.0–0.2)

## 2024-01-01 LAB — CBC WITH DIFFERENTIAL/PLATELET
Abs Immature Granulocytes: 0.06 10*3/uL (ref 0.00–0.07)
Basophils Absolute: 0 10*3/uL (ref 0.0–0.1)
Basophils Relative: 1 %
Eosinophils Absolute: 0 10*3/uL (ref 0.0–0.5)
Eosinophils Relative: 1 %
HCT: 25.8 % — ABNORMAL LOW (ref 39.0–52.0)
Hemoglobin: 8.3 g/dL — ABNORMAL LOW (ref 13.0–17.0)
Immature Granulocytes: 2 %
Lymphocytes Relative: 47 %
Lymphs Abs: 1.7 10*3/uL (ref 0.7–4.0)
MCH: 33.6 pg (ref 26.0–34.0)
MCHC: 32.2 g/dL (ref 30.0–36.0)
MCV: 104.5 fL — ABNORMAL HIGH (ref 80.0–100.0)
Monocytes Absolute: 0.3 10*3/uL (ref 0.1–1.0)
Monocytes Relative: 8 %
Neutro Abs: 1.5 10*3/uL — ABNORMAL LOW (ref 1.7–7.7)
Neutrophils Relative %: 41 %
Platelets: 70 10*3/uL — ABNORMAL LOW (ref 150–400)
RBC: 2.47 MIL/uL — ABNORMAL LOW (ref 4.22–5.81)
RDW: 13.8 % (ref 11.5–15.5)
WBC: 3.7 10*3/uL — ABNORMAL LOW (ref 4.0–10.5)
nRBC: 0 % (ref 0.0–0.2)

## 2024-01-01 LAB — BASIC METABOLIC PANEL WITH GFR
Anion gap: 6 (ref 5–15)
BUN: 28 mg/dL — ABNORMAL HIGH (ref 8–23)
CO2: 22 mmol/L (ref 22–32)
Calcium: 8 mg/dL — ABNORMAL LOW (ref 8.9–10.3)
Chloride: 113 mmol/L — ABNORMAL HIGH (ref 98–111)
Creatinine, Ser: 1.93 mg/dL — ABNORMAL HIGH (ref 0.61–1.24)
GFR, Estimated: 38 mL/min — ABNORMAL LOW (ref >60)
Glucose, Bld: 112 mg/dL — ABNORMAL HIGH (ref 70–99)
Potassium: 3.5 mmol/L (ref 3.5–5.1)
Sodium: 141 mmol/L (ref 135–145)

## 2024-01-01 LAB — MAGNESIUM: Magnesium: 2 mg/dL (ref 1.7–2.4)

## 2024-01-01 LAB — PHOSPHORUS: Phosphorus: 2.6 mg/dL (ref 2.5–4.6)

## 2024-01-01 MED ORDER — LACTULOSE 10 GM/15ML PO SOLN: 20.0000 g | Freq: Two times a day (BID) | ORAL | Status: DC

## 2024-01-01 MED ORDER — VITAMIN D (ERGOCALCIFEROL) 1.25 MG (50000 UNIT) PO CAPS
50000.0000 [IU] | ORAL_CAPSULE | ORAL | Status: DC
  Administered 2024-01-01: 50000 [IU] via ORAL
  Filled 2024-01-01: qty 1

## 2024-01-01 MED ORDER — VITAMIN D (ERGOCALCIFEROL) 1.25 MG (50000 UNIT) PO CAPS: 50000.0000 [IU] | ORAL_CAPSULE | ORAL | 0 refills | Status: AC

## 2024-01-01 NOTE — Progress Notes (Addendum)
 PT Cancellation Note  Patient Details Name: Albert Figueroa MRN: [id number] DOB: November 03, 1955   Cancelled Treatment:    Reason Eval/Treat Not Completed: Patient declined, no reason specified (Author attempts to provide treatment. Pt still displeased by his current situation and feeling as though his goals of care are incongruent with recommendations made by team.) Pt politely declines working with PT at this time, confidently reports he is moving fine and will be moving even better when he is allowed to leave.   12:03 PM, 01/01/24 Rosamaria Lints, PT, DPT Physical Therapist - [facility]  [phone] (ASCOM)    [name] 01/01/2024, 12:03 PM

## 2024-01-01 NOTE — TOC Progression Note (Signed)
 Transition of Care [facility]) - Progression Note    Patient Details  Name: Albert Figueroa MRN: [id number] Date of Birth: [birth year]  Transition of [facility]) CM/SW Contact  Marlowe Sax, RN Phone Number: 01/01/2024, 3:43 PM  Clinical Narrative:     Attempted to talk to the son, No Answer       Expected Discharge Plan and Services         Expected Discharge Date: 01/01/24                                     Social Determinants of Health (SDOH) Interventions SDOH Screenings   Food Insecurity: No Food Insecurity (12/30/2023)  Recent Concern: Food Insecurity - Food Insecurity Present (10/06/2023)   Received from [facility]  Housing: Low Risk  (12/30/2023)  Transportation Needs: No Transportation Needs (12/30/2023)  Utilities: Not At Risk (12/30/2023)  Financial Resource Strain: High Risk (10/06/2023)   Received from [facility]  Social Connections: Unknown (12/30/2023)  Tobacco Use: High Risk (12/30/2023)    Readmission Risk Interventions    12/30/2023   12:01 PM 03/06/2023    3:23 PM  Readmission Risk Prevention Plan  Transportation Screening Complete Complete  PCP or Specialist Appt within 3-5 Days Complete Complete  HRI or Home Care Consult  --  Social Work Consult for Recovery Care Planning/Counseling Complete Complete  Palliative Care Screening Not Applicable Not Applicable  Medication Review Oceanographer) Complete Complete

## 2024-01-01 NOTE — Discharge Summary (Signed)
 [facility] Discharge Summary   Patient: Albert Figueroa NWG:[id number]  PCP: Marisue Ivan, MD  Date of admission: 12/29/2023   Date of discharge:  01/01/2024     Discharge Diagnoses:  Principal Problem:   Diarrhea Active Problems:   Acute kidney injury superimposed on stage IIIb chronic kidney disease (HCC)   Acute kidney injury superimposed on CKD (HCC)   Chronic pain syndrome   Lactic acidosis   Hyperammonemia (HCC)   Thrombocytopenia (HCC)   Type 2 diabetes mellitus with renal complication (HCC)   Hyperlipidemia, unspecified   Weakness   Admitted From: Home Disposition:  Home   Recommendations for Outpatient Follow-up:  Follow-up with PCP on Monday 01/05/24 and repeat CBC and BMP on Monday to check on renal functions and pancytopenia. Patient has history of dementia, he does not understand well his medical condition so I spoke to his son that I would like to keep him overnight to monitor CBC to recheck labs tomorrow a.m. but patient does not wanted to stay in the hospital.  His son verbalized understanding leaving the hospital and recommended to follow with PCP on Monday to recheck CBC and BMP. Follow up LABS/TEST: CBC with differential and BMP on Monday for 01/05/24   Follow-up Information     Marisue Ivan, MD Follow up in 4 day(s).   Specialty: Family Medicine Why: Repeat CBC and BMP on Monday 01/05/24 Contact information: [address] [facility] [place] [address] [phone]                Diet recommendation: Cardiac and Carb modified diet  Activity: The patient is advised to gradually reintroduce usual activities, as tolerated  Discharge Condition: stable  Code Status: Full code   History of present illness: As per the H and P dictated on admission. Hospital Course:  Albert Figueroa is a 68 y.o. male with medical history significant of recent hospitalization for acute kidney injury and hyperammonemia.  Patient was started  on lactulose.  Patient also takes Depakote.  Patient went into rehab and was having lots of diarrhea.  He stated a lot of people over there were also having diarrhea.  He had some abdominal pain.  No bowel movement since coming into the hospital.  No nausea or vomiting.  Patient states he has not eaten in a couple days.  Has had a lot of weight loss from 202 down to 117.  No fever chills or sweats.  Found to have an elevated lactic acid and elevated creatinine.   [facility] consulted for admission and further management as below    Assessment and Plan:   # Pancytopenia, anemia, leukopenia and thrombocytopenia Discontinued heparin subcu  Patient has history of dementia, he does not understand well his medical condition so I spoke to his son that I would like to keep him overnight to monitor CBC to recheck labs tomorrow a.m. but patient does not wanted to stay in the hospital.  His son verbalized understanding leaving the hospital. So I recommended to follow with PCP on Monday to recheck CBC and BMP.   # Diarrhea secondary to norovirus infection  GI pathogen positive for norovirus Stool C. difficile antigen positive, toxin negative but toxigenic C. difficile by PCR is positive.  As per treatment protocol, no need to treat C. difficile. S/p IV fluid for hydration, lost IV access on 4/3 so RN was advised to encourage him for oral fluid intake. S/p probiotics and symptomatic treatment. Currently patient is stable, diarrhea  resolved, no abdominal pain, no nausea vomiting.     # AKI secondary to dehydration Creatinine 2.71----2.22--1.93 S/p IVF, continue oral hydration   # Metabolic acidosis due to gastroenteritis.  Resolved S/p Bicarb IV push 50 mEq given   # Hypotension due to dehydration continue to monitor at home. 4/3 BP still soft   # Chronic pain syndrome: On oxycodone # Hyperammonemia Current ammonia level is normal.  Held lactulose during hospital stay, it was resumed at low-dose on discharge  to titrate 1-2 BM per day. # Type 2 diabetes mellitus with renal complication: On Farxiga # Weakness: PT and OT evaluation done # Hyperlipidemia, unspecified: Continue statin  # Vitamin D Insufficiency: started vitamin D 50,000 units p.o. weekly, follow with PCP to repeat vitamin D level after 3 to 6 months.  Body mass index is 23.85 kg/m.  Nutrition Interventions:  - Patient was instructed, not to drive, operate heavy machinery, perform activities at heights, swimming or participation in water activities or provide baby sitting services while on Pain, Sleep and Anxiety Medications; until his outpatient Physician has advised to do so again.  - Also recommended to not to take more than prescribed Pain, Sleep and Anxiety Medications.  Patient was seen by physical therapy, who recommended no therapy needed on discharge On the day of the discharge the patient's vitals were stable, and no other acute medical condition were reported by patient. the patient was felt safe to be discharge at Home.  Consultants: None Procedures: None  Discharge Exam: General: Appear in no distress, no Rash; Oral Mucosa Clear, moist. Cardiovascular: S1 and S2 Present, no Murmur, Respiratory: normal respiratory effort, Bilateral Air entry present and no Crackles, no wheezes Abdomen: Bowel Sound present, Soft and no tenderness, no hernia Extremities: no Pedal edema, no calf tenderness Neurology: CN grossly intact, no focal deficit. affect appropriate.  Filed Weights   12/29/23 2114  Weight: 77.6 kg   Vitals:   01/01/24 0827 01/01/24 0937  BP: 113/65 91/74  Pulse: (!) 58 74  Resp: 18 18  Temp: 98.3 F (36.8 C)   SpO2: 100% 100%    DISCHARGE MEDICATION: Allergies as of 01/01/2024       Reactions   Gabapentin Other (See Comments)   Lethargic, Confused   Amlodipine    Other reaction(s): Other (see comments) Caused elevated blood pressure and headaches   Ozempic (0.25 Or 0.5 Mg-dose) [semaglutide(0.25  Or 0.5mg -dos)]    Lethargy.  Also dropped blood sugars too low        Medication List     STOP taking these medications    Vitamin D-1000 Max St 25 MCG (1000 UT) tablet Generic drug: Cholecalciferol       TAKE these medications    acetaminophen 325 MG tablet Commonly known as: TYLENOL Take 2 tablets (650 mg total) by mouth every 6 (six) hours as needed for mild pain (or Fever >/= 101).   albuterol 108 (90 Base) MCG/ACT inhaler Commonly known as: VENTOLIN HFA Inhale into the lungs every 6 (six) hours as needed for wheezing or shortness of breath.   Dexilant 60 MG capsule Generic drug: dexlansoprazole Take 60 mg by mouth daily.   divalproex 500 MG 24 hr tablet Commonly known as: DEPAKOTE ER Take 1,000 mg by mouth in the morning and at bedtime.   donepezil 10 MG disintegrating tablet Commonly known as: ARICEPT ODT Take 10 mg by mouth at bedtime.   Farxiga 10 MG Tabs tablet Generic drug: dapagliflozin propanediol Take 10 mg  by mouth daily.   fenofibrate 160 MG tablet Take 160 mg by mouth daily.   ferrous sulfate 325 (65 FE) MG tablet Take 325 mg by mouth daily with breakfast.   hydrOXYzine 10 MG tablet Commonly known as: ATARAX Take 10 mg by mouth 3 (three) times daily as needed.   lactulose 10 GM/15ML solution Commonly known as: CHRONULAC Take 30 mLs (20 g total) by mouth 2 (two) times daily. Titrate to 1--2 bowel movements per day What changed:  how much to take when to take this additional instructions   naloxone 4 MG/0.1ML Liqd nasal spray kit Commonly known as: NARCAN Place 1 spray into the nose once.   Oxycodone HCl 20 MG Tabs Take 1 tablet (20 mg total) by mouth 5 (five) times daily.   Roflumilast 250 MCG Tabs Take 1 tablet by mouth daily.   rosuvastatin 20 MG tablet Commonly known as: CRESTOR Take 20 mg by mouth at bedtime.   tiZANidine 4 MG capsule Commonly known as: ZANAFLEX Take 4 mg by mouth every 12 (twelve) hours.   traZODone 50  MG tablet Commonly known as: DESYREL Take 0.5 tablets (25 mg total) by mouth at bedtime as needed for sleep.   Trelegy Ellipta 100-62.5-25 MCG/ACT Aepb Generic drug: Fluticasone-Umeclidin-Vilant Inhale into the lungs daily.   Vitamin D (Ergocalciferol) 1.25 MG (50000 UNIT) Caps capsule Commonly known as: DRISDOL Take 1 capsule (50,000 Units total) by mouth every 7 (seven) days. Start taking on: January 08, 2024       Allergies  Allergen Reactions   Gabapentin Other (See Comments)    Lethargic, Confused   Amlodipine     Other reaction(s): Other (see comments) Caused elevated blood pressure and headaches   Ozempic (0.25 Or 0.5 Mg-Dose) [Semaglutide(0.25 Or 0.5mg -Dos)]     Lethargy.  Also dropped blood sugars too low   Discharge Instructions     Call MD for:  difficulty breathing, headache or visual disturbances   Complete by: As directed    Call MD for:  extreme fatigue   Complete by: As directed    Call MD for:  persistant dizziness or light-headedness   Complete by: As directed    Call MD for:  persistant nausea and vomiting   Complete by: As directed    Call MD for:  severe uncontrolled pain   Complete by: As directed    Call MD for:  temperature >100.4   Complete by: As directed    Diet - low sodium heart healthy   Complete by: As directed    Discharge instructions   Complete by: As directed    Follow-up with PCP on Monday 01/05/24 and repeat CBC and BMP on Monday to check on renal functions and pancytopenia. Patient has history of dementia, he does not understand well his medical condition show I spoke to his son that I would like to keep him overnight to monitor CBC to recheck labs tomorrow a.m. but patient does not wanted to stay in the hospital.  His son verbalized understanding leaving the hospital and recommended to follow with PCP on Monday to recheck CBC and BMP.   Increase activity slowly   Complete by: As directed        The results of significant  diagnostics from this hospitalization (including imaging, microbiology, ancillary and laboratory) are listed below for reference.    Significant Diagnostic Studies: CT ABDOMEN PELVIS WO CONTRAST Result Date: 12/30/2023 CLINICAL DATA:  Abdominal pain, acute, nonlocalized EXAM: CT ABDOMEN AND PELVIS WITHOUT  CONTRAST TECHNIQUE: Multidetector CT imaging of the abdomen and pelvis was performed following the standard protocol without IV contrast. RADIATION DOSE REDUCTION: This exam was performed according to the departmental dose-optimization program which includes automated exposure control, adjustment of the mA and/or kV according to patient size and/or use of iterative reconstruction technique. COMPARISON:  None Available. FINDINGS: Lower chest: No acute abnormality. Coronary artery and aortic atherosclerosis. Hepatobiliary: No focal hepatic abnormality. Gallbladder unremarkable. Pancreas: No focal abnormality or ductal dilatation. Spleen: No focal abnormality.  Normal size. Adrenals/Urinary Tract: Adrenal glands normal. Bilateral renal cysts. No follow-up imaging recommended. Renovascular calcifications. No stones or hydronephrosis. Urinary bladder unremarkable. Stomach/Bowel: Stomach, large and small bowel grossly unremarkable. Vascular/Lymphatic: Aortoiliac atherosclerosis. No evidence of aneurysm or adenopathy. Reproductive: No visible focal abnormality. Other: No free fluid or free air. VP shunt catheter in place terminating in the right lower quadrant. Musculoskeletal: No acute bony abnormality. IMPRESSION: No acute findings in the abdomen or pelvis. Diffuse coronary artery disease, aortic atherosclerosis. VP shunt terminates in the right lower quadrant, grossly unremarkable. Electronically Signed   By: Charlett Nose M.D.   On: 12/30/2023 01:29   CT HEAD WO CONTRAST ( ) Result Date: 12/30/2023 CLINICAL DATA:  Altered mental status EXAM: CT HEAD WITHOUT CONTRAST TECHNIQUE: Contiguous axial images were  obtained from the base of the skull through the vertex without intravenous contrast. RADIATION DOSE REDUCTION: This exam was performed according to the departmental dose-optimization program which includes automated exposure control, adjustment of the mA and/or kV according to patient size and/or use of iterative reconstruction technique. COMPARISON:  12/09/2023 FINDINGS: Brain: Right occipital approach shunt catheter terminating in the right lateral ventricle. Bilateral high frontal encephalomalacia. No mass or acute hemorrhage. Vascular: No hyperdense vessel or unexpected vascular calcification. Skull: The visualized skull base, calvarium and extracranial soft tissues are normal. Sinuses/Orbits: No fluid levels or advanced mucosal thickening of the visualized paranasal sinuses. No mastoid or middle ear effusion. Normal orbits. Other: None. IMPRESSION: 1. No acute intracranial abnormality. 2. Right occipital approach shunt catheter terminating in the right lateral ventricle. No hydrocephalus. 3. Bilateral high frontal encephalomalacia. Electronically Signed   By: Deatra Robinson M.D.   On: 12/30/2023 01:29   CT HEAD WO CONTRAST ( ) Result Date: 12/09/2023 CLINICAL DATA:  Altered mental status EXAM: CT HEAD WITHOUT CONTRAST TECHNIQUE: Contiguous axial images were obtained from the base of the skull through the vertex without intravenous contrast. RADIATION DOSE REDUCTION: This exam was performed according to the departmental dose-optimization program which includes automated exposure control, adjustment of the mA and/or kV according to patient size and/or use of iterative reconstruction technique. COMPARISON:  03/04/2023 FINDINGS: Brain: Right frontal encephalomalacia. Right posterior approach shunt catheter with tip in the anterior right lateral ventricle. No hydrocephalus. No acute hemorrhage. Vascular: No hyperdense vessel or unexpected vascular calcification. Skull: The visualized skull base, calvarium and  extracranial soft tissues are normal. Sinuses/Orbits: No fluid levels or advanced mucosal thickening of the visualized paranasal sinuses. No mastoid or middle ear effusion. Normal orbits. Other: None. IMPRESSION: 1. No acute intracranial abnormality. 2. Right frontal encephalomalacia. 3. Right posterior approach shunt catheter with tip in the anterior right lateral ventricle. No hydrocephalus. Electronically Signed   By: Deatra Robinson M.D.   On: 12/09/2023 20:40    Microbiology: Recent Results (from the past 240 hours)  Resp panel by RT-PCR (RSV, Flu A&B, Covid) Anterior Nasal Swab     Status: None   Collection Time: 12/29/23  9:25 PM   Specimen: Anterior Nasal  Swab  Result Value Ref Range Status   SARS Coronavirus 2 by RT PCR NEGATIVE NEGATIVE Final    Comment: (NOTE) SARS-CoV-2 target nucleic acids are NOT DETECTED.  The SARS-CoV-2 RNA is generally detectable in upper respiratory specimens during the acute phase of infection. The lowest concentration of SARS-CoV-2 viral copies this assay can detect is 138 copies/mL. A negative result does not preclude SARS-Cov-2 infection and should not be used as the sole basis for treatment or other patient management decisions. A negative result may occur with  improper specimen collection/handling, submission of specimen other than nasopharyngeal swab, presence of viral mutation(s) within the areas targeted by this assay, and inadequate number of viral copies(<138 copies/mL). A negative result must be combined with clinical observations, patient history, and epidemiological information. The expected result is Negative.  Fact Sheet for Patients:  BloggerCourse.com  Fact Sheet for Healthcare Providers:  SeriousBroker.it  This test is no t yet approved or cleared by the [place] FDA and  has been authorized for detection and/or diagnosis of SARS-CoV-2 by FDA under an Emergency Use  Authorization (EUA). This EUA will remain  in effect (meaning this test can be used) for the duration of the COVID-19 declaration under Section 564(b)(1) of the Act, 21 U.S.C.section 360bbb-3(b)(1), unless the authorization is terminated  or revoked sooner.       Influenza A by PCR NEGATIVE NEGATIVE Final   Influenza B by PCR NEGATIVE NEGATIVE Final    Comment: (NOTE) The Xpert Xpress SARS-CoV-2/FLU/RSV plus assay is intended as an aid in the diagnosis of influenza from Nasopharyngeal swab specimens and should not be used as a sole basis for treatment. Nasal washings and aspirates are unacceptable for Xpert Xpress SARS-CoV-2/FLU/RSV testing.  Fact Sheet for Patients: BloggerCourse.com  Fact Sheet for Healthcare Providers: SeriousBroker.it  This test is not yet approved or cleared by the [place] FDA and has been authorized for detection and/or diagnosis of SARS-CoV-2 by FDA under an Emergency Use Authorization (EUA). This EUA will remain in effect (meaning this test can be used) for the duration of the COVID-19 declaration under Section 564(b)(1) of the Act, 21 U.S.C. section 360bbb-3(b)(1), unless the authorization is terminated or revoked.     Resp Syncytial Virus by PCR NEGATIVE NEGATIVE Final    Comment: (NOTE) Fact Sheet for Patients: BloggerCourse.com  Fact Sheet for Healthcare Providers: SeriousBroker.it  This test is not yet approved or cleared by the [place] FDA and has been authorized for detection and/or diagnosis of SARS-CoV-2 by FDA under an Emergency Use Authorization (EUA). This EUA will remain in effect (meaning this test can be used) for the duration of the COVID-19 declaration under Section 564(b)(1) of the Act, 21 U.S.C. section 360bbb-3(b)(1), unless the authorization is terminated or revoked.  Performed at [facility], [address] [place], [place] [address]   Blood culture (single)     Status: None (Preliminary result)   Collection Time: 12/30/23  1:00 AM   Specimen: BLOOD  Result Value Ref Range Status   Specimen Description BLOOD RIGHT ASSIST CONTROL  Final   Special Requests   Final    BOTTLES DRAWN AEROBIC AND ANAEROBIC Blood Culture results may not be optimal due to an inadequate volume of blood received in culture bottles   Culture   Final    NO GROWTH 2 DAYS Performed at [facility], [address], [place], [place] [address]    Report Status PENDING  Incomplete  C Difficile Quick Screen  w PCR reflex     Status: Abnormal   Collection Time: 12/30/23 12:51 PM   Specimen: STOOL  Result Value Ref Range Status   C Diff antigen POSITIVE (A) NEGATIVE Final   C Diff toxin NEGATIVE NEGATIVE Final   C Diff interpretation Results are indeterminate. See PCR results.  Final    Comment: Performed at [facility], [address] [place], [place] [address]  Gastrointestinal Panel by PCR , Stool     Status: Abnormal   Collection Time: 12/30/23 12:51 PM   Specimen: Stool  Result Value Ref Range Status   Campylobacter species NOT DETECTED NOT DETECTED Final   Plesimonas shigelloides NOT DETECTED NOT DETECTED Final   Salmonella species NOT DETECTED NOT DETECTED Final   Yersinia enterocolitica NOT DETECTED NOT DETECTED Final   Vibrio species NOT DETECTED NOT DETECTED Final   Vibrio cholerae NOT DETECTED NOT DETECTED Final   Enteroaggregative E coli (EAEC) NOT DETECTED NOT DETECTED Final   Enteropathogenic E coli (EPEC) NOT DETECTED NOT DETECTED Final   Enterotoxigenic E coli (ETEC) NOT DETECTED NOT DETECTED Final   Shiga like toxin producing E coli (STEC) NOT DETECTED NOT DETECTED Final   Shigella/Enteroinvasive E coli (EIEC) NOT DETECTED NOT DETECTED Final   Cryptosporidium NOT DETECTED NOT DETECTED Final   Cyclospora cayetanensis NOT DETECTED NOT DETECTED Final   Entamoeba histolytica NOT  DETECTED NOT DETECTED Final   Giardia lamblia NOT DETECTED NOT DETECTED Final   Adenovirus F40/41 NOT DETECTED NOT DETECTED Final   Astrovirus NOT DETECTED NOT DETECTED Final   Norovirus GI/GII DETECTED (A) NOT DETECTED Final    Comment: RESULT CALLED TO, READ BACK BY AND VERIFIED WITH: Percival Spanish ALLA 12/30/23 1445 MW    Rotavirus A NOT DETECTED NOT DETECTED Final   Sapovirus (I, II, IV, and V) NOT DETECTED NOT DETECTED Final    Comment: Performed at [facility], [address] [place], [place] [address]  C. Diff by PCR, Reflexed     Status: Abnormal   Collection Time: 12/30/23 12:51 PM  Result Value Ref Range Status   Toxigenic C. Difficile by PCR POSITIVE (A) NEGATIVE Final    Comment: Positive for toxigenic C. difficile with little to no toxin production. Only treat if clinical presentation suggests symptomatic illness. Performed at [facility], [address] [place], [place] [address]      Labs: CBC: Recent Labs  Lab 12/29/23 2125 01/01/24 0423 01/01/24 0854  WBC 8.6 3.1* 3.7*  NEUTROABS  --   --  1.5*  HGB 11.9* 7.5* 8.3*  HCT 36.8* 23.6* 25.8*  MCV 104.2* 104.4* 104.5*  PLT 121* 67* 70*   Basic Metabolic Panel: Recent Labs  Lab 12/29/23 2125 12/30/23 0910 12/31/23 0146 01/01/24 0423  NA 142 140 139 141  K 4.0 4.2 3.6 3.5  CL 114* 117* 113* 113*  CO2 16* 16* 18* 22  GLUCOSE 144* 89 108* 112*  BUN 44* 38* 37* 28*  CREATININE 2.71* 2.46* 2.22* 1.93*  CALCIUM 9.0 8.0* 7.8* 8.0*  MG  --   --  2.1 2.0  PHOS  --   --  3.6 2.6   Liver Function Tests: Recent Labs  Lab 12/29/23 2125  AST 25  ALT 12  ALKPHOS 75  BILITOT 1.1  PROT 7.9  ALBUMIN 3.3*   Recent Labs  Lab 12/29/23 2125  LIPASE 42   Recent Labs  Lab 12/30/23 0100  AMMONIA 22   Cardiac Enzymes: No results for input(s): "CKTOTAL", "  CKMB", "CKMBINDEX", "TROPONINI" in the last 168 hours. BNP (last 3 results) No results for input(s): "BNP" in the last 8760 hours. CBG: No  results for input(s): "GLUCAP" in the last 168 hours.  Time spent: 35 minutes  Signed:  Gillis Santa  [facility] 01/01/2024 3:18 PM

## 2024-01-01 NOTE — Progress Notes (Signed)
 Occupational Therapy Treatment Patient Details Name: Albert Figueroa MRN: [id number] DOB: May 18, 1956 Today's Date: 01/01/2024   History of present illness Albert Figueroa is a 41JWJ who comes to ED less than 24hours after DC from STR back to home, pt having repeated diarrhea. Pt was at [facility] after DC from here in early march, s/p fall at home 12/08/23. 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 Tests (+) for toxigenic c. diff and norovirus.   OT comments  Pt pleasant and agreeable to session, requests to work on bathing tasks. Pt performs bed mobility with supervision, doffs shirt sitting EOB, and doffs pants from STS with CGA for safety + vcs for sequencing. OT provides setup for UB bathing/grooming tasks while seated EOB, pt able to perform LB bathing while standing with CGA for dynamic balance activities. Pt states he will be going home this afternoon and declines further mobility in room. Pt is a falls risk due to impulsive behaviors, decreased insight into deficits and impaired cognition. Pt would benefit from continued OT services upon hospital discharge; will need 24/7 supervision due to cognition.       If plan is discharge home, recommend the following:  Supervision due to cognitive status;Help with stairs or ramp for entrance;Direct supervision/assist for financial management;Direct supervision/assist for medications management;Assistance with cooking/housework;A little help with walking and/or transfers;A little help with bathing/dressing/bathroom;Assist for transportation   Equipment Recommendations  None recommended by OT       Precautions / Restrictions Precautions Precautions: Fall Recall of Precautions/Restrictions: Impaired Restrictions Weight Bearing Restrictions Per Provider Order: No       Mobility Bed Mobility Overal  bed mobility: Needs Assistance Bed Mobility: Supine to Sit     Supine to sit: Supervision Sit to supine: Supervision        Transfers Overall transfer level: Needs assistance Equipment used: None Transfers: Sit to/from Stand                   Balance Overall balance assessment: Needs assistance Sitting-balance support: Feet supported Sitting balance-Leahy Scale: Good     Standing balance support: No upper extremity supported, During functional activity Standing balance-Leahy Scale: Fair Standing balance comment: risk of falls due to impulsivity and cognition. CGA for safety during LB bathing in standing                           ADL either performed or assessed with clinical judgement   ADL Overall ADL's : Needs assistance/impaired     Grooming: Wash/dry hands;Wash/dry face;Sitting;Set up Grooming Details (indicate cue type and reason): seated EOB Upper Body Bathing: Set up;Sitting Upper Body Bathing Details (indicate cue type and reason): seated EOB Lower Body Bathing: Contact guard assist;Sit to/from stand Lower Body Bathing Details (indicate cue type and reason): standing, CGA for safety, no LOB Upper Body Dressing : Sitting;Supervision/safety;Minimal assistance Upper Body Dressing Details (indicate cue type and reason): doffs shirt seated supervision, minA to don new gown Lower Body Dressing: Contact guard assist;Sit to/from stand Lower Body Dressing Details (indicate cue type and reason): dons underwear/socks             Functional mobility during ADLs: Cueing for sequencing;Contact guard assist General ADL Comments: Pt motivated to get clean, telling author "I feel amazing" after having new gown/bath.     Communication Communication Communication: No  apparent difficulties   Cognition Arousal: Alert Behavior During Therapy: WFL for tasks assessed/performed, Impulsive Cognition: History of cognitive impairments, Cognition impaired              OT - Cognition Comments: Pt expresses irritation and frusteration with hospitalization and rehab stay                 Following commands: Intact Following commands impaired: Follows one step commands with increased time      Cueing   Cueing Techniques: Verbal cues, Tactile cues, Visual cues             Pertinent Vitals/ Pain       Pain Assessment Pain Assessment: No/denies pain         Frequency  Min 2X/week        Progress Toward Goals  OT Goals(current goals can now be found in the care plan section)  Progress towards OT goals: Progressing toward goals  Acute Rehab OT Goals OT Goal Formulation: Patient unable to participate in goal setting Time For Goal Achievement: 01/14/24 Potential to Achieve Goals: Good ADL Goals Pt Will Perform Grooming: with modified independence;standing Pt Will Perform Lower Body Dressing: with modified independence;sit to/from stand Pt Will Transfer to Toilet: with modified independence;ambulating Pt Will Perform Toileting - Clothing Manipulation and hygiene: with modified independence;sit to/from stand;sitting/lateral leans  Plan         AM-PAC OT "6 Clicks" Daily Activity     Outcome Measure   Help from another person eating meals?: None Help from another person taking care of personal grooming?: None Help from another person toileting, which includes using toliet, bedpan, or urinal?: A Little Help from another person bathing (including washing, rinsing, drying)?: A Little Help from another person to put on and taking off regular upper body clothing?: A Little Help from another person to put on and taking off regular lower body clothing?: A Little 6 Click Score: 20    End of Session    OT Visit Diagnosis: Other symptoms and signs involving cognitive function;History of falling (Z91.81);Unsteadiness on feet (R26.81);Other abnormalities of gait and mobility (R26.89)   Activity Tolerance Patient tolerated  treatment well   Patient Left in bed;with call bell/phone within reach;with bed alarm set   Nurse Communication Mobility status (IV beeping)        Time: 4098-1191 OT Time Calculation (min): 25 min  Charges: OT General Charges $OT Visit: 1 Visit OT Treatments $Self Care/Home Management : 23-37 mins  Latavia Goga L. Tyleigh Mahn, OTR/L  01/01/24, 12:46 PM

## 2024-01-01 NOTE — Progress Notes (Signed)
 [facility] Progress Note  Patient: Albert Figueroa    WUJ:[id number]  DOA: 12/29/2023     Date of Service: the patient was seen and examined on 01/01/2024  Chief Complaint  Patient presents with   Diarrhea   Brief hospital course: Albert Figueroa is a 68 y.o. male with medical history significant of recent hospitalization for acute kidney injury and hyperammonemia.  Patient was started on lactulose.  Patient also takes Depakote.  Patient went into rehab and was having lots of diarrhea.  He stated a lot of people over there were also having diarrhea.  He had some abdominal pain.  No bowel movement since coming into the hospital.  No nausea or vomiting.  Patient states he has not eaten in a couple days.  Has had a lot of weight loss from 202 down to 117.  No fever chills or sweats.  Found to have an elevated lactic acid and elevated creatinine.   TRH consulted for admission and further management as below   Assessment and Plan:  # Diarrhea secondary to norovirus infection  GI pathogen positive for norovirus Stool C. difficile antigen positive, toxin negative but toxigenic C. difficile by PCR is positive.  As per treatment protocol, no need to treat C. difficile. S/p IV fluid for hydration, lost IV access on 4/3 so RN was advised to encourage him for oral fluid intake. Started probiotics Continue symptomatic treatment  # Pancytopenia, anemia, leukopenia and thrombocytopenia Discontinued heparin subcu Continue to monitor cbc w/diff    # AKI secondary to dehydration Creatinine 2.71----2.22--1.93 S/p IVF, continue oral hydration Monitor renal functions and urine output Avoid nephrotoxic medications   # Metabolic acidosis due to gastroenteritis.  Resolved S/p Bicarb IV push 50 mEq given Monitor electrolytes  # Hypotension due to dehydration continue to monitor 4/3 BP still soft  # Chronic pain syndrome: On oxycodone   # Hyperammonemia Current ammonia level is normal.  Hold off  on lactulose.  Sometimes this can happen with Depakote.   # Type 2 diabetes mellitus with renal complication (HCC) On Farxiga   # Weakness PT and OT evaluations.   # Hyperlipidemia, unspecified Continue statin   # Vitamin D Insufficiency: started vitamin D 50,000 units p.o. weekly, follow with PCP to repeat vitamin D level after 3 to 6 months.    Body mass index is 23.85 kg/m.  Interventions:  Diet: Soft diet DVT Prophylaxis: SCD, pharmacological prophylaxis contraindicated due to low plts    Advance goals of care discussion: Full code  Family Communication: family was not present at bedside, at the time of interview.  The pt provided permission to discuss medical plan with the family. Opportunity was given to ask question and all questions were answered satisfactorily.  4/2 discussed with patient's son over the phone. 4/3 I called twice to patient's son but he did not answer the phone.  Disposition:  Pt is from home, admitted with norovirus gastroenteritis, dehydration and AKI, still has elevated creatinine on IV fluids, which precludes a safe discharge. Discharge to home, when stable, most likely in 1 to 2 days. Pancytopenic, plan is to repeat CBC tomorrow a.m.   Subjective: No significant events overnight, denies any nausea vomiting, no abdominal pain, no diarrhea, had no BM since admission.   Physical Exam: General: NAD, lying comfortably Appear in no distress, affect appropriate Eyes: PERRLA ENT: Oral Mucosa Clear, moist  Neck: no JVD,  Cardiovascular: S1 and S2 Present, no Murmur,  Respiratory: good respiratory effort, Bilateral  Air entry equal and Decreased, no Crackles, no wheezes Abdomen: Bowel Sound present, Soft and no tenderness,  Skin: no rashes Extremities: no Pedal edema, no calf tenderness Neurologic: without any new focal findings Gait not checked due to patient safety concerns  Vitals:   12/31/23 2118 01/01/24 0505 01/01/24 0827 01/01/24 0937   BP: 114/69 108/68 113/65 91/74  Pulse: 64 (!) 51 (!) 58 74  Resp: 19 20 18 18   Temp: 98.1 F (36.7 C) 97.6 F (36.4 C) 98.3 F (36.8 C)   TempSrc: Oral Oral Oral   SpO2: 98% 100% 100% 100%  Weight:      Height:        Intake/Output Summary (Last 24 hours) at 01/01/2024 1307 Last data filed at 01/01/2024 1200 Gross per 24 hour  Intake 240 ml  Output 1750 ml  Net -1510 ml   Filed Weights   12/29/23 2114  Weight: 77.6 kg    Data Reviewed: I have personally reviewed and interpreted daily labs, tele strips, imagings as discussed above. I reviewed all nursing notes, pharmacy notes, vitals, pertinent old records I have discussed plan of care as described above with RN and patient/family.  CBC: Recent Labs  Lab 12/29/23 2125 01/01/24 0423 01/01/24 0854  WBC 8.6 3.1* 3.7*  NEUTROABS  --   --  1.5*  HGB 11.9* 7.5* 8.3*  HCT 36.8* 23.6* 25.8*  MCV 104.2* 104.4* 104.5*  PLT 121* 67* 70*   Basic Metabolic Panel: Recent Labs  Lab 12/29/23 2125 12/30/23 0910 12/31/23 0146 01/01/24 0423  NA 142 140 139 141  K 4.0 4.2 3.6 3.5  CL 114* 117* 113* 113*  CO2 16* 16* 18* 22  GLUCOSE 144* 89 108* 112*  BUN 44* 38* 37* 28*  CREATININE 2.71* 2.46* 2.22* 1.93*  CALCIUM 9.0 8.0* 7.8* 8.0*  MG  --   --  2.1 2.0  PHOS  --   --  3.6 2.6    Studies: No results found.  Scheduled Meds:  divalproex  1,000 mg Oral Daily   donepezil  10 mg Oral QHS   ferrous sulfate  325 mg Oral Q breakfast   fluticasone furoate-vilanterol  1 puff Inhalation Daily   And   umeclidinium bromide  1 puff Inhalation Daily   nicotine  14 mg Transdermal Daily   oxyCODONE  20 mg Oral 5 X Daily   pantoprazole  40 mg Oral Daily   roflumilast  250 mcg Oral Daily   rosuvastatin  20 mg Oral QHS   saccharomyces boulardii  250 mg Oral BID   tiZANidine  4 mg Oral QHS   Vitamin D (Ergocalciferol)  50,000 Units Oral Q7 days   Continuous Infusions:  sodium chloride Stopped (01/01/24 1200)   PRN Meds:  acetaminophen, albuterol, hydrOXYzine, ondansetron **OR** ondansetron (ZOFRAN) IV, traZODone  Time spent: 55 minutes  Author: Gillis Santa. MD [facility] 01/01/2024 1:07 PM  To reach On-call, see care teams to locate the attending and reach out to them via [email or web address]. If 7PM-7AM, please contact night-coverage If you still have difficulty reaching the attending provider, please page the [facility] (Director on Call) for [facility] on amion for assistance.

## 2024-01-04 LAB — CULTURE, BLOOD (SINGLE): Culture: NO GROWTH

## 2024-01-15 ENCOUNTER — Inpatient Hospital Stay

## 2024-01-15 ENCOUNTER — Inpatient Hospital Stay
Admission: EM | Admit: 2024-01-15 | Discharge: 2024-01-20 | DRG: 917 | Disposition: A | Discharge status: Home Health | Attending: Family Medicine | Admitting: Family Medicine

## 2024-01-15 ENCOUNTER — Other Ambulatory Visit: Payer: Self-pay

## 2024-01-15 ENCOUNTER — Emergency Department

## 2024-01-15 DIAGNOSIS — Z66 Do not resuscitate: Secondary | ICD-10-CM | POA: Diagnosis present

## 2024-01-15 DIAGNOSIS — Y92009 Unspecified place in unspecified non-institutional (private) residence as the place of occurrence of the external cause: Secondary | ICD-10-CM

## 2024-01-15 DIAGNOSIS — Z720 Tobacco use: Secondary | ICD-10-CM | POA: Diagnosis not present

## 2024-01-15 DIAGNOSIS — G929 Unspecified toxic encephalopathy: Secondary | ICD-10-CM | POA: Diagnosis present

## 2024-01-15 DIAGNOSIS — F11929 Opioid use, unspecified with intoxication, unspecified: Secondary | ICD-10-CM

## 2024-01-15 DIAGNOSIS — E1122 Type 2 diabetes mellitus with diabetic chronic kidney disease: Secondary | ICD-10-CM | POA: Diagnosis present

## 2024-01-15 DIAGNOSIS — D509 Iron deficiency anemia, unspecified: Secondary | ICD-10-CM | POA: Diagnosis not present

## 2024-01-15 DIAGNOSIS — R531 Weakness: Secondary | ICD-10-CM | POA: Diagnosis present

## 2024-01-15 DIAGNOSIS — F0283 Dementia in other diseases classified elsewhere, unspecified severity, with mood disturbance: Secondary | ICD-10-CM | POA: Diagnosis present

## 2024-01-15 DIAGNOSIS — T402X1A Poisoning by other opioids, accidental (unintentional), initial encounter: Principal | ICD-10-CM | POA: Insufficient documentation

## 2024-01-15 DIAGNOSIS — F17210 Nicotine dependence, cigarettes, uncomplicated: Secondary | ICD-10-CM | POA: Diagnosis present

## 2024-01-15 DIAGNOSIS — F32A Depression, unspecified: Secondary | ICD-10-CM | POA: Diagnosis present

## 2024-01-15 DIAGNOSIS — I129 Hypertensive chronic kidney disease with stage 1 through stage 4 chronic kidney disease, or unspecified chronic kidney disease: Secondary | ICD-10-CM | POA: Diagnosis present

## 2024-01-15 DIAGNOSIS — N189 Chronic kidney disease, unspecified: Secondary | ICD-10-CM | POA: Diagnosis not present

## 2024-01-15 DIAGNOSIS — I959 Hypotension, unspecified: Secondary | ICD-10-CM | POA: Diagnosis present

## 2024-01-15 DIAGNOSIS — N179 Acute kidney failure, unspecified: Secondary | ICD-10-CM | POA: Diagnosis present

## 2024-01-15 DIAGNOSIS — G3183 Dementia with Lewy bodies: Secondary | ICD-10-CM | POA: Diagnosis present

## 2024-01-15 DIAGNOSIS — Z91A28 Caregiver's intentional underdosing of medication regimen for other reason: Secondary | ICD-10-CM | POA: Diagnosis not present

## 2024-01-15 DIAGNOSIS — G894 Chronic pain syndrome: Secondary | ICD-10-CM | POA: Diagnosis not present

## 2024-01-15 DIAGNOSIS — G40909 Epilepsy, unspecified, not intractable, without status epilepticus: Secondary | ICD-10-CM | POA: Diagnosis present

## 2024-01-15 DIAGNOSIS — Z8661 Personal history of infections of the central nervous system: Secondary | ICD-10-CM

## 2024-01-15 DIAGNOSIS — E860 Dehydration: Secondary | ICD-10-CM | POA: Diagnosis present

## 2024-01-15 DIAGNOSIS — K219 Gastro-esophageal reflux disease without esophagitis: Secondary | ICD-10-CM | POA: Diagnosis present

## 2024-01-15 DIAGNOSIS — J449 Chronic obstructive pulmonary disease, unspecified: Secondary | ICD-10-CM | POA: Diagnosis present

## 2024-01-15 DIAGNOSIS — E876 Hypokalemia: Secondary | ICD-10-CM | POA: Diagnosis present

## 2024-01-15 DIAGNOSIS — E1129 Type 2 diabetes mellitus with other diabetic kidney complication: Secondary | ICD-10-CM | POA: Diagnosis present

## 2024-01-15 DIAGNOSIS — D631 Anemia in chronic kidney disease: Secondary | ICD-10-CM | POA: Diagnosis present

## 2024-01-15 DIAGNOSIS — E11649 Type 2 diabetes mellitus with hypoglycemia without coma: Secondary | ICD-10-CM | POA: Diagnosis present

## 2024-01-15 DIAGNOSIS — Z8249 Family history of ischemic heart disease and other diseases of the circulatory system: Secondary | ICD-10-CM

## 2024-01-15 DIAGNOSIS — E861 Hypovolemia: Secondary | ICD-10-CM | POA: Diagnosis not present

## 2024-01-15 DIAGNOSIS — F418 Other specified anxiety disorders: Secondary | ICD-10-CM | POA: Diagnosis present

## 2024-01-15 DIAGNOSIS — N184 Chronic kidney disease, stage 4 (severe): Secondary | ICD-10-CM | POA: Diagnosis present

## 2024-01-15 DIAGNOSIS — J439 Emphysema, unspecified: Secondary | ICD-10-CM | POA: Diagnosis present

## 2024-01-15 DIAGNOSIS — Z833 Family history of diabetes mellitus: Secondary | ICD-10-CM

## 2024-01-15 DIAGNOSIS — Z608 Other problems related to social environment: Secondary | ICD-10-CM | POA: Diagnosis present

## 2024-01-15 DIAGNOSIS — T50906A Underdosing of unspecified drugs, medicaments and biological substances, initial encounter: Secondary | ICD-10-CM | POA: Diagnosis present

## 2024-01-15 DIAGNOSIS — T7601XA Adult neglect or abandonment, suspected, initial encounter: Secondary | ICD-10-CM | POA: Diagnosis present

## 2024-01-15 DIAGNOSIS — Z79899 Other long term (current) drug therapy: Secondary | ICD-10-CM

## 2024-01-15 DIAGNOSIS — E162 Hypoglycemia, unspecified: Secondary | ICD-10-CM | POA: Diagnosis present

## 2024-01-15 DIAGNOSIS — D696 Thrombocytopenia, unspecified: Secondary | ICD-10-CM | POA: Diagnosis present

## 2024-01-15 DIAGNOSIS — W19XXXA Unspecified fall, initial encounter: Secondary | ICD-10-CM

## 2024-01-15 DIAGNOSIS — E785 Hyperlipidemia, unspecified: Secondary | ICD-10-CM | POA: Diagnosis present

## 2024-01-15 DIAGNOSIS — Z8619 Personal history of other infectious and parasitic diseases: Secondary | ICD-10-CM

## 2024-01-15 DIAGNOSIS — F0284 Dementia in other diseases classified elsewhere, unspecified severity, with anxiety: Secondary | ICD-10-CM | POA: Diagnosis present

## 2024-01-15 DIAGNOSIS — I10 Essential (primary) hypertension: Secondary | ICD-10-CM | POA: Diagnosis not present

## 2024-01-15 LAB — COMPREHENSIVE METABOLIC PANEL WITH GFR
ALT: 18 U/L (ref 0–44)
AST: 58 U/L — ABNORMAL HIGH (ref 15–41)
Albumin: 2.6 g/dL — ABNORMAL LOW (ref 3.5–5.0)
Alkaline Phosphatase: 39 U/L (ref 38–126)
Anion gap: 13 (ref 5–15)
BUN: 62 mg/dL — ABNORMAL HIGH (ref 8–23)
CO2: 17 mmol/L — ABNORMAL LOW (ref 22–32)
Calcium: 8.1 mg/dL — ABNORMAL LOW (ref 8.9–10.3)
Chloride: 115 mmol/L — ABNORMAL HIGH (ref 98–111)
Creatinine, Ser: 6 mg/dL — ABNORMAL HIGH (ref 0.61–1.24)
GFR, Estimated: 10 mL/min — ABNORMAL LOW (ref >60)
Glucose, Bld: 76 mg/dL (ref 70–99)
Potassium: 4.3 mmol/L (ref 3.5–5.1)
Sodium: 145 mmol/L (ref 135–145)
Total Bilirubin: 1.2 mg/dL (ref 0.0–1.2)
Total Protein: 6 g/dL — ABNORMAL LOW (ref 6.5–8.1)

## 2024-01-15 LAB — URINALYSIS, W/ REFLEX TO CULTURE (INFECTION SUSPECTED)
Bilirubin Urine: NEGATIVE
Glucose, UA: 50 mg/dL — AB
Ketones, ur: NEGATIVE mg/dL
Leukocytes,Ua: NEGATIVE
Nitrite: NEGATIVE
Protein, ur: 30 mg/dL — AB
Specific Gravity, Urine: 1.014 (ref 1.005–1.030)
Squamous Epithelial / HPF: 0 /HPF (ref 0–5)
pH: 5 (ref 5.0–8.0)

## 2024-01-15 LAB — CBC
HCT: 23.8 % — ABNORMAL LOW (ref 39.0–52.0)
Hemoglobin: 6.9 g/dL — ABNORMAL LOW (ref 13.0–17.0)
MCH: 34 pg (ref 26.0–34.0)
MCHC: 29 g/dL — ABNORMAL LOW (ref 30.0–36.0)
MCV: 117.2 fL — ABNORMAL HIGH (ref 80.0–100.0)
Platelets: 55 10*3/uL — ABNORMAL LOW (ref 150–400)
RBC: 2.03 MIL/uL — ABNORMAL LOW (ref 4.22–5.81)
RDW: 14.5 % (ref 11.5–15.5)
WBC: 4.5 10*3/uL (ref 4.0–10.5)
nRBC: 0 % (ref 0.0–0.2)

## 2024-01-15 LAB — MRSA NEXT GEN BY PCR, NASAL: MRSA by PCR Next Gen: NOT DETECTED

## 2024-01-15 LAB — LACTIC ACID, PLASMA
Lactic Acid, Venous: 1.5 mmol/L (ref 0.5–1.9)
Lactic Acid, Venous: 1.9 mmol/L (ref 0.5–1.9)

## 2024-01-15 LAB — AMMONIA: Ammonia: 56 umol/L — ABNORMAL HIGH (ref 9–35)

## 2024-01-15 LAB — APTT: aPTT: 41 s — ABNORMAL HIGH (ref 24–36)

## 2024-01-15 LAB — PROTIME-INR
INR: 1.5 — ABNORMAL HIGH (ref 0.8–1.2)
Prothrombin Time: 18.2 s — ABNORMAL HIGH (ref 11.4–15.2)

## 2024-01-15 LAB — GLUCOSE, CAPILLARY
Glucose-Capillary: 80 mg/dL (ref 70–99)
Glucose-Capillary: 86 mg/dL (ref 70–99)

## 2024-01-15 LAB — PREPARE RBC (CROSSMATCH)

## 2024-01-15 LAB — ABO/RH: ABO/RH(D): A POS

## 2024-01-15 MED ORDER — INSULIN ASPART 100 UNIT/ML IJ SOLN: 0.0000 [IU] | Freq: Every day | INTRAMUSCULAR | Status: DC

## 2024-01-15 MED ORDER — NALOXONE HCL 2 MG/2ML IJ SOSY
0.4000 mg | PREFILLED_SYRINGE | Freq: Once | INTRAMUSCULAR | Status: AC
  Administered 2024-01-15: 0.4 mg via INTRAVENOUS
  Filled 2024-01-15: qty 2

## 2024-01-15 MED ORDER — DEXTROSE 50 % IV SOLN: 1.0000 | INTRAVENOUS | Status: DC | PRN

## 2024-01-15 MED ORDER — LACTATED RINGERS IV SOLN: INTRAVENOUS | Status: DC

## 2024-01-15 MED ORDER — NALOXONE HCL 2 MG/2ML IJ SOSY
0.4000 mg | PREFILLED_SYRINGE | Freq: Once | INTRAMUSCULAR | Status: AC
  Administered 2024-01-15: 0.4 mg via INTRAVENOUS

## 2024-01-15 MED ORDER — INSULIN ASPART 100 UNIT/ML IJ SOLN: 0.0000 [IU] | Freq: Three times a day (TID) | INTRAMUSCULAR | Status: DC

## 2024-01-15 MED ORDER — ONDANSETRON HCL 4 MG/2ML IJ SOLN: 4.0000 mg | Freq: Four times a day (QID) | INTRAMUSCULAR | Status: DC | PRN

## 2024-01-15 MED ORDER — NALOXONE HCL 0.4 MG/ML IJ SOLN: 0.4000 mg | Freq: Once | INTRAMUSCULAR | Status: DC

## 2024-01-15 MED ORDER — SODIUM CHLORIDE 0.9 % IV SOLN: 10.0000 mL/h | Freq: Once | INTRAVENOUS | Status: DC

## 2024-01-15 MED ORDER — NALOXONE HCL 2 MG/2ML IJ SOSY
PREFILLED_SYRINGE | INTRAMUSCULAR | Status: AC
Start: 2024-01-15 — End: 2024-01-15
  Administered 2024-01-15: 2 mg via INTRAVENOUS
  Filled 2024-01-15: qty 2

## 2024-01-15 MED ORDER — ROSUVASTATIN CALCIUM 20 MG PO TABS
20.0000 mg | ORAL_TABLET | Freq: Every day | ORAL | Status: DC
  Administered 2024-01-16 – 2024-01-19 (×4): 20 mg via ORAL
  Filled 2024-01-15 (×4): qty 1

## 2024-01-15 MED ORDER — ACETAMINOPHEN 650 MG RE SUPP: 650.0000 mg | Freq: Four times a day (QID) | RECTAL | Status: DC | PRN

## 2024-01-15 MED ORDER — ACETAMINOPHEN 325 MG PO TABS: 650.0000 mg | ORAL_TABLET | Freq: Four times a day (QID) | ORAL | Status: DC | PRN

## 2024-01-15 MED ORDER — HEPARIN SODIUM (PORCINE) 5000 UNIT/ML IJ SOLN: 5000.0000 [IU] | Freq: Three times a day (TID) | INTRAMUSCULAR | Status: DC

## 2024-01-15 MED ORDER — NALOXONE HCL 4 MG/10ML IJ SOLN
0.2500 mg/h | INTRAVENOUS | Status: DC
  Administered 2024-01-15 – 2024-01-16 (×2): 0.25 mg/h via INTRAVENOUS
  Filled 2024-01-15 (×2): qty 10

## 2024-01-15 MED ORDER — HYDRALAZINE HCL 20 MG/ML IJ SOLN: 5.0000 mg | Freq: Four times a day (QID) | INTRAMUSCULAR | Status: DC | PRN

## 2024-01-15 MED ORDER — SODIUM CHLORIDE 0.9 % IV BOLUS
1000.0000 mL | Freq: Once | INTRAVENOUS | Status: AC
  Administered 2024-01-15: 1000 mL via INTRAVENOUS

## 2024-01-15 MED ORDER — ONDANSETRON HCL 4 MG PO TABS: 4.0000 mg | ORAL_TABLET | Freq: Four times a day (QID) | ORAL | Status: DC | PRN

## 2024-01-15 MED ORDER — SODIUM CHLORIDE 0.9 % IV SOLN: Freq: Once | INTRAVENOUS | Status: AC

## 2024-01-15 MED ORDER — NALOXONE HCL 2 MG/2ML IJ SOSY: 0.4000 mg | PREFILLED_SYRINGE | Freq: Once | INTRAMUSCULAR | Status: AC

## 2024-01-15 NOTE — Assessment & Plan Note (Addendum)
 Etiology workup in progress, differentials include prerenal however intrarenal process cannot be excluded at this time in setting of acute on chronic anemia requiring blood transfusion and hypotension Ultrasound of the renals have been ordered on admission Status post sodium chloride 2 L bolus per EDP Continue with LR infusion at 150 mL/h to maintain MAP > 65 for perfusion Recheck BMP in the a.m.

## 2024-01-15 NOTE — ED Notes (Signed)
 Lab bedside. Pt removed IV and RN found IV to be intact. Bleeding controlled with gauze and coband dressing. Pt states "I'm leaving I'm going home my brother is in the parking lot waiting for me. I'm not doing this hospital thing again". Writer notified MD and Martina Sledge MD is en route to bedside.

## 2024-01-15 NOTE — ED Notes (Signed)
 Pt able to answer questions and follow commands and is requesting eating and wanting something to drink. Pt is alert at this time. Kinner MD aware.

## 2024-01-15 NOTE — Assessment & Plan Note (Addendum)
 Fall precautions PT, OT to evaluate patient tomorrow
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
## 2024-01-15 NOTE — ED Notes (Signed)
 This RN gave report to Ronell Coe RN and performed care handoff via phone call. pt updated on plan of care and is stable.

## 2024-01-15 NOTE — ED Notes (Signed)
 Patient alert post narcan administration. VS noted and stable.

## 2024-01-15 NOTE — ED Notes (Signed)
 Lab called at this time to request a phlebotomist come bedside for lab collections. This RN as well as other Engineer, manufacturing were unsuccessful. MD aware.

## 2024-01-15 NOTE — Assessment & Plan Note (Addendum)
 PDMP reviewed Patient currently prescribed oxycodone 20 mg tablet, 160 tablets, 30-day supply that was written and filled on 01/12/2024.  Gabapentin 100 mg capsules, 120 capsules for 30-day supply that was written on 03/26/2023 and filled on 12/29/2023 Patient is also prescribed trazodone 25 mg nightly as needed for sleep  Trazodone and gabapentin and home oxycodone will not be resumed on admission as patient is requiring Narcan gtt. at this time

## 2024-01-15 NOTE — H&P (Addendum)
 History and Physical   Albert Figueroa ZOX:[id number] DOB: 1955-12-07 DOA: 01/15/2024  PCP: [facility]  Outpatient Specialists: Dr. Sherryll Burger Patient coming from: home via EMS  I have personally briefly reviewed patient's old medical records in [facility] EMR.  Chief Concern: weakness, fall  HPI: Mr. Albert Figueroa is a 68 year old male with history of GERD, hyperlipidemia, memory decline, history of thrombocytopenia, chronic pain syndrome, generalized weakness, who presents emergency department for chief concerns of weakness and fall yesterday.  Vitals in the ED showed T of 97.7, rr of 12, hr initially 56, blood pressure initially 57/33, currently improved to 99/83, SpO2 100% on room air.  Serum sodium is 145, potassium 4.3, chloride 115, bicarb 17, BUN of 62, serum creatinine of 6.00, EGFR of 10, nonfasting blood glucose 76, WBC 4.5, hemoglobin 6.9, platelets of 55.  Lactic acid 1.5.  ED treatment: Patient has been typed and screened, 1 unit PRBC have been ordered for transfusion, sodium chloride 2 L bolus have been administered and Narcan 0.4 mg IV one-time dose. ------------------------------------- At bedside, patient is able to tell me his first and last name, the current location of [facility].  Initially he states he is 68 years old and then 68 years old.  I corrected him and stated that he is 68 years old.  He then acknowledged that he is 68 years old.  He states that the year is 2064 and then 2065.  I corrected him and ask this is 2020, 2021, 2022?  Oh, wait a minute, it is 2025.  At bedside, he denies chest pain, shortness of breath, abdominal pain, dysuria, hematuria, blood in his stool.  However, he was not able to tell me what color his stool is.  He denies trauma to his person.  He states that he is not weak at this time.  He states that he was weak at home.  He reports that he is eating fine however does not remember the last thing he ate. And he does not  remember when he last ate.  Social history: He states he lives at home with his sister.  He states she is currently working in [place].  He denies tobacco, EtOH, recreational drug use.  He states he is retired.  ROS: Constitutional: no weight change, no fever ENT/Mouth: no sore throat, no rhinorrhea Eyes: no eye pain, no vision changes Cardiovascular: no chest pain, no dyspnea,  no edema, no palpitations Respiratory: no cough, no sputum, no wheezing Gastrointestinal: no nausea, no vomiting, no diarrhea, no constipation Genitourinary: no urinary incontinence, no dysuria, no hematuria Musculoskeletal: no arthralgias, no myalgias Skin: no skin lesions, no pruritus, Neuro: + weakness, no loss of consciousness, no syncope Psych: no anxiety, no depression, no decrease appetite Heme/Lymph: no bruising, no bleeding  ED Course: Discussed with EDP, patient requiring hospitalization for chief concerns of acute kidney injury, acute on chronic anemia requiring blood transfusion and hypotension currently responding to IV fluid.  Assessment/Plan  Principal Problem:   Acute kidney injury superimposed on stage IIIb chronic kidney disease (HCC) Active Problems:   Chronic pain syndrome   Hypotension   Thrombocytopenia (HCC)   Iron deficiency anemia   Dyslipidemia   Tobacco use   Essential hypertension   Fall at home, initial encounter   Type 2 diabetes mellitus with renal complication (HCC)   COPD (chronic obstructive pulmonary disease) (HCC)   Depression with anxiety   Hyperlipidemia, unspecified   GERD without esophagitis   Hypoglycemia   Type  2 diabetes mellitus with chronic kidney disease, without long-term current use of insulin (HCC)   Weakness   Polypharmacy   Opioid overdose (HCC)   Assessment and Plan:  * Acute kidney injury superimposed on stage IIIb chronic kidney disease (HCC) Etiology workup in progress, differentials include prerenal however intrarenal process cannot be  excluded at this time in setting of acute on chronic anemia requiring blood transfusion and hypotension Ultrasound of the renals have been ordered on admission Status post sodium chloride 2 L bolus per EDP Continue with LR infusion at 150 mL/h to maintain MAP > 65 for perfusion Recheck BMP in the a.m.  Hypotension This is likely multifactorial in setting of polypharmacy with unintentional opioid overdose (oxycodone 20 mg, 160 tablets for 30-day supply plus gabapentin prescription) and AKI on CKD 3b, recent hospitalization for diarrhea resulting in GI loss along with poor p.o. intake and in setting of acute on chronic anemia requiring blood transfusion Status post sodium chloride 2 L bolus per EDP On admission I have initiated LR infusion at 150 mL/h Maintain MAP greater than 65 Addendum: Blood pressure decreased to 72/51 (MAP 58).  I was messaged by nursing staff.  Additional dose Narcan 0.4 mg IV one-time dose ordered.  Narcan gtt. initiated on admission.  Chronic pain syndrome PDMP reviewed Patient currently prescribed oxycodone 20 mg tablet, 160 tablets, 30-day supply that was written and filled on 01/12/2024.  Gabapentin 100 mg capsules, 120 capsules for 30-day supply that was written on 03/26/2023 and filled on 12/29/2023 Patient is also prescribed trazodone 25 mg nightly as needed for sleep  Trazodone and gabapentin and home oxycodone will not be resumed on admission as patient is requiring Narcan gtt. at this time  Essential hypertension Patient currently low normotensive, no scheduled antihypertensive medications will be initiated on admission Hydralazine 5 mg IV every 6 hours as needed for SBP greater 170, 5 days ordered  Fall at home, initial encounter Fall precautions PT, OT to evaluate patient tomorrow  Type 2 diabetes mellitus with chronic kidney disease, without long-term current use of insulin (HCC) Insulin SSI with at bedtime coverage ordered, renal  dosing  Hypoglycemia Nonfasting blood glucose was 76 on admission D50, 1 amp as needed for hypoglycemia, when CBG is less than 70, 5 days of coverage ordered on admission  Chart reviewed.   DVT prophylaxis: TED hose; AM team to initiate pharmacologic DVT prophylaxis when the benefits outweigh the risk Code Status: DNR/DNI, MOST form in ACP reviewed on admission Diet: Renal Family Communication: Attempted to call patient's home, daughter at [phone], voicemail box has not been set up for this patient. Disposition Plan: Pending clinical course; guarded prognosis Consults called: PT, OT for tomorrow.  AM team to consult nephrology if renal function does not improve. 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, spinal meningitis   Hyperlipidemia    Hypertension    Kidney disease    stage 3   Lewy body dementia (HCC)    short term memory issues only   Lumbar stenosis    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 in the 1980s"   Osteoarthrosis    Seizure (HCC)    none since 2009   Stage 4 chronic kidney disease (HCC)  Tremor of both hands    Past Surgical History:  Procedure Laterality Date   CATARACT EXTRACTION W/PHACO Right 05/14/2023   Procedure: CATARACT EXTRACTION PHACO AND INTRAOCULAR LENS PLACEMENT (IOC) RIGHT DIABETIC;  Surgeon: Lockie Mola, MD;  Location: [facility];  Service: Ophthalmology;  Laterality: Right;  6.70 0:39.8   CATARACT EXTRACTION W/PHACO Left 05/28/2023   Procedure: CATARACT EXTRACTION PHACO AND INTRAOCULAR LENS PLACEMENT (IOC) LEFT DIABETIC 6.34 00:37.1;  Surgeon: Lockie Mola, MD;  Location: [facility];  Service: Ophthalmology;  Laterality: Left;   CSF SHUNT     placed in 1980s   KNEE SURGERY     multiple  surgeries on both knees   Social History:  reports that he has been smoking cigarettes. He has a 96 pack-year smoking history. He has never used smokeless tobacco. He reports that he does not drink alcohol and does not use drugs.  Allergies  Allergen Reactions   Gabapentin Other (See Comments)    Lethargic, Confused   Amlodipine     Other reaction(s): Other (see comments) Caused elevated blood pressure and headaches   Ozempic (0.25 Or 0.5 Mg-Dose) [Semaglutide(0.25 Or 0.5mg -Dos)]     Lethargy.  Also dropped blood sugars too low   Family History  Problem Relation Age of Onset   Diabetes Mother    Cancer Mother    CAD Father    Family history: Family history reviewed and not pertinent.  Prior to Admission medications   Medication Sig Start Date End Date Taking? Authorizing Provider  acetaminophen (TYLENOL) 325 MG tablet Take 2 tablets (650 mg total) by mouth every 6 (six) hours as needed for mild pain (or Fever >/= 101). 09/07/22   Hollice Espy, MD  albuterol (VENTOLIN HFA) 108 (90 Base) MCG/ACT inhaler Inhale into the lungs every 6 (six) hours as needed for wheezing or shortness of breath.    [provider]  dapagliflozin propanediol (FARXIGA) 10 MG TABS tablet Take 10 mg by mouth daily.    [provider]  dexlansoprazole (DEXILANT) 60 MG capsule Take 60 mg by mouth daily.    [provider]  divalproex (DEPAKOTE ER) 500 MG 24 hr tablet Take 1,000 mg by mouth in the morning and at bedtime.    [provider]  donepezil (ARICEPT ODT) 10 MG disintegrating tablet Take 10 mg by mouth at bedtime.    [provider]  fenofibrate 160 MG tablet Take 160 mg by mouth daily. 04/25/22   [provider]  ferrous sulfate 325 (65 FE) MG tablet Take 325 mg by mouth daily with breakfast.    [provider]  Fluticasone-Umeclidin-Vilant (TRELEGY ELLIPTA) 100-62.5-25 MCG/ACT AEPB Inhale into the lungs daily.    [provider]   hydrOXYzine (ATARAX) 10 MG tablet Take 10 mg by mouth 3 (three) times daily as needed.    [provider]  lactulose (CHRONULAC) 10 GM/15ML solution Take 30 mLs (20 g total) by mouth 2 (two) times daily. Titrate to 1--2 bowel movements per day 01/01/24   Gillis Santa, MD  naloxone [facility]) nasal spray 4 mg/0.1 mL Place 1 spray into the nose once. 11/29/22   [provider]  Oxycodone HCl 20 MG TABS Take 1 tablet (20 mg total) by mouth 5 (five) times daily. 12/24/23   Jonah Blue, MD  Roflumilast 250 MCG TABS Take 1 tablet by mouth daily.    [provider]  rosuvastatin (CRESTOR) 20 MG tablet Take 20 mg by mouth at bedtime. 04/08/22  [provider]  tiZANidine (ZANAFLEX) 4 MG capsule Take 4 mg by mouth every 12 (twelve) hours.    [provider]  traZODone (DESYREL) 50 MG tablet Take 0.5 tablets (25 mg total) by mouth at bedtime as needed for sleep. 12/24/23   Jonah Blue, MD  Vitamin D, Ergocalciferol, (DRISDOL) 1.25 MG (50000 UNIT) CAPS capsule Take 1 capsule (50,000 Units total) by mouth every 7 (seven) days. 01/08/24 04/07/24  Gillis Santa, MD   Physical Exam: Vitals:   01/15/24 1315 01/15/24 1320 01/15/24 1325 01/15/24 1330  BP: (!) 72/51 113/80 103/82 132/74  Pulse: 65 81 85 87  Resp: 15 (!) 23 18 (!) 22  Temp:    (!) 97.4 F (36.3 C)  TempSrc:    Oral  SpO2: 95% 100% 99% 97%  Weight:      Height:       Constitutional: appears pale, weak, mildly confused however is redirectable Eyes: PERRL, lids and conjunctivae normal ENMT: Mucous membranes are moist and pale. Posterior pharynx clear of any exudate or lesions. Age-appropriate dentition. Hearing appropriate Neck: normal, supple, no masses, no thyromegaly Respiratory: clear to auscultation bilaterally, no wheezing, no crackles. Normal respiratory effort. No accessory muscle use.  Cardiovascular: Regular rate and rhythm, no murmurs / rubs / gallops.  Bilateral lower extremity pitting 2+  edema. 2+ pedal pulses. No carotid bruits.  Abdomen: no tenderness, no masses palpated, no hepatosplenomegaly. Bowel sounds positive.  Musculoskeletal: no clubbing / cyanosis. No joint deformity upper and lower extremities. Good ROM, no contractures, no atrophy. Normal muscle tone.  Skin: Skin is pale, no rashes, lesions, ulcers. Neurologic: Sensation intact. Strength 5/5 in all 4.  Psychiatric: Lacks judgment, insight, appropriate orientation. Normal mood.  Flat affect  EKG: independently reviewed, showing sinus rhythm with rate of 54, QTc 433  Chest x-ray on Admission: I personally reviewed and I agree with radiologist reading as below.  DG Chest Port 1 View Result Date: 01/15/2024 CLINICAL DATA:  Weakness.  Fall. EXAM: PORTABLE CHEST 1 VIEW COMPARISON:  09/01/2022. FINDINGS: Low lung volume. Mild diffuse pulmonary vascular congestion, likely accentuated by low lung volume. No frank pulmonary edema. Bilateral lung fields are otherwise clear. No acute consolidation or lung collapse. Bilateral costophrenic angles are clear. Normal cardio-mediastinal silhouette. No acute osseous abnormalities. The soft tissues are within normal limits. Redemonstration of calcified presumed VP shunt tubings along the right side of the chest. IMPRESSION: Mild diffuse pulmonary vascular congestion, likely accentuated by low lung volume. No frank pulmonary edema. Electronically Signed   By: Jules Schick M.D.   On: 01/15/2024 11:53   CT Head Wo Contrast Result Date: 01/15/2024 CLINICAL DATA:  68 year old male with weakness and fall yesterday, struck head. Recent hospitalization for sepsis. EXAM: CT HEAD WITHOUT CONTRAST TECHNIQUE: Contiguous axial images were obtained from the base of the skull through the vertex without intravenous contrast. RADIATION DOSE REDUCTION: This exam was performed according to the departmental dose-optimization program which includes automated exposure control, adjustment of the mA and/or kV  according to patient size and/or use of iterative reconstruction technique. COMPARISON:  Brain MRI 03/10/2023, head CT 12/30/2023. FINDINGS: Brain: Chronic posterior approach ventriculostomy shunt terminating within the right lateral ventricle at the septum pellucidum (series 4, image 34). 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 Associated chronic dorsal clivus similar low-density dural thickening or collection, also stable since that time. And these findings also present on 2022 MRI. Stable underlying  cerebral volume. Stable ventricle size and configuration since 2023, no ventriculomegaly. No acute intracranial hemorrhage or mass effect identified. Basilar cisterns remain normal. Chronic encephalomalacia in the superior frontal gyri greater on the left. No cortically based acute infarct identified. Stable gray-white matter differentiation throughout the brain. Vascular: Calcified atherosclerosis at the skull base. No suspicious intracranial vascular hyperdensity. Skull: Chronic right superior vertex and posterior right occiput burr holes. Underlying hyperostosis. No acute osseous abnormality identified. Sinuses/Orbits: Visualized paranasal sinuses and mastoids are stable and well aerated. Other: Stable orbit and scalp soft tissues. Chronic right posterior convexity shunt reservoir and tubing appears stable. IMPRESSION: 1. No acute intracranial abnormality or acute traumatic injury identified. 2. 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 Chronic superior frontal gyrus encephalomalacia greater on the right. Electronically Signed   By: Marlise Simpers M.D.   On: 01/15/2024 11:27   Labs on Admission: I have personally reviewed following labs  CBC: Recent Labs  Lab 01/15/24 1057  WBC 4.5  HGB 6.9*  HCT 23.8*  MCV 117.2*   PLT 55*   Basic Metabolic Panel: Recent Labs  Lab 01/15/24 1057  NA 145  K 4.3  CL 115*  CO2 17*  GLUCOSE 76  BUN 62*  CREATININE 6.00*  CALCIUM 8.1*   GFR: Estimated Creatinine Clearance: 12.7 mL/min (A) (by C-G formula based on SCr of 6 mg/dL (H)).  Liver Function Tests: Recent Labs  Lab 01/15/24 1057  AST 58*  ALT 18  ALKPHOS 39  BILITOT 1.2  PROT 6.0*  ALBUMIN 2.6*   Urine analysis:    Component Value Date/Time   COLORURINE YELLOW (A) 12/30/2023 0430   APPEARANCEUR CLEAR (A) 12/30/2023 0430   LABSPEC 1.020 12/30/2023 0430   PHURINE 5.0 12/30/2023 0430   GLUCOSEU >=500 (A) 12/30/2023 0430   HGBUR NEGATIVE 12/30/2023 0430   BILIRUBINUR NEGATIVE 12/30/2023 0430   KETONESUR NEGATIVE 12/30/2023 0430   PROTEINUR NEGATIVE 12/30/2023 0430   NITRITE NEGATIVE 12/30/2023 0430   LEUKOCYTESUR NEGATIVE 12/30/2023 0430   CRITICAL CARE Performed by: Dr. Reinhold Carbine  Total critical care time: 32 minutes  Critical care time was exclusive of separately billable procedures and treating other patients.  Critical care was necessary to treat or prevent imminent or life-threatening deterioration.  Critical care was time spent personally by me on the following activities: development of treatment plan with patient as well as nursing, discussions with consultants, evaluation of patient's response to treatment, examination of patient, obtaining history from patient or surrogate, ordering and performing treatments and interventions, ordering and review of laboratory studies, ordering and review of radiographic studies, pulse oximetry and re-evaluation of patient's condition.  This document was prepared using Dragon Voice Recognition software and may include unintentional dictation errors.  Dr. Reinhold Carbine [facility]  If 7PM-7AM, please contact overnight-coverage provider If 7AM-7PM, please contact day attending provider [email or web address]  01/15/2024, 1:57 PM

## 2024-01-15 NOTE — ED Provider Notes (Signed)
 [facility] Provider Note    Event Date/Time   First MD Initiated Contact with Patient 01/15/24 1035     (approximate)   History   Fall and Weakness   HPI  Albert Figueroa is a 68 y.o. male with a history of diabetes, chronic kidney disease, hyper ammonemia, Lewy body dementia, history of brain shunt who presents with weakness, altered mental status after reported fall with possible head injury.     Physical Exam   Triage Vital Signs: ED Triage Vitals  Encounter Vitals Group     BP 01/15/24 1045 (!) 51/34     Systolic BP Percentile --      Diastolic BP Percentile --      Pulse Rate 01/15/24 1041 (!) 54     Resp 01/15/24 1041 19     Temp 01/15/24 1041 97.7 F (36.5 C)     Temp Source 01/15/24 1041 Oral     SpO2 01/15/24 1041 96 %     Weight 01/15/24 1039 86.4 kg (190 lb 7.6 oz)     Height 01/15/24 1039 1.803 m (5\' 11" )     Head Circumference --      Peak Flow --      Pain Score 01/15/24 1039 0     Pain Loc --      Pain Education --      Exclude from Growth Chart --     Most recent vital signs: Vitals:   01/15/24 1325 01/15/24 1330  BP: 103/82 132/74  Pulse: 85 87  Resp: 18 (!) 22  Temp:  (!) 97.4 F (36.3 C)  SpO2: 99% 97%     General: Awake, answers questions appropriately but is somewhat slow to respond CV:  Good peripheral perfusion.  Resp:  Normal effort.  Clear to auscultation Abd:  No distention.  Soft, nontender Other:  Moves all extremities equally, no signs of severe trauma   ED Results / Procedures / Treatments   Labs (all labs ordered are listed, but only abnormal results are displayed) Labs Reviewed  CBC - Abnormal; Notable for the following components:      Result Value   RBC 2.03 (*)    Hemoglobin 6.9 (*)    HCT 23.8 (*)    MCV 117.2 (*)    MCHC 29.0 (*)    Platelets 55 (*)    All other components within normal limits  COMPREHENSIVE METABOLIC PANEL WITH GFR - Abnormal; Notable for the following  components:   Chloride 115 (*)    CO2 17 (*)    BUN 62 (*)    Creatinine, Ser 6.00 (*)    Calcium 8.1 (*)    Total Protein 6.0 (*)    Albumin 2.6 (*)    AST 58 (*)    GFR, Estimated 10 (*)    All other components within normal limits  PROTIME-INR - Abnormal; Notable for the following components:   Prothrombin Time 18.2 (*)    INR 1.5 (*)    All other components within normal limits  APTT - Abnormal; Notable for the following components:   aPTT 41 (*)    All other components within normal limits  CULTURE, BLOOD (SINGLE)  LACTIC ACID, PLASMA  LACTIC ACID, PLASMA  URINALYSIS, W/ REFLEX TO CULTURE (INFECTION SUSPECTED)  AMMONIA  TYPE AND SCREEN  PREPARE RBC (CROSSMATCH)  ABO/RH     EKG  ED ECG REPORT I, Jene Every, the attending physician, personally viewed and interpreted this ECG.  Rhythm: normal sinus rhythm QRS Axis: normal Intervals: normal ST/T Wave abnormalities: normal Narrative Interpretation: no evidence of acute ischemia    RADIOLOGY CT head without acute abnormality    PROCEDURES:  Critical Care performed: yes  CRITICAL CARE Performed by: Jene Every   Total critical care time: 30 minutes  Critical care time was exclusive of separately billable procedures and treating other patients.  Critical care was necessary to treat or prevent imminent or life-threatening deterioration.  Critical care was time spent personally by me on the following activities: development of treatment plan with patient and/or surrogate as well as nursing, discussions with consultants, evaluation of patient's response to treatment, examination of patient, obtaining history from patient or surrogate, ordering and performing treatments and interventions, ordering and review of laboratory studies, ordering and review of radiographic studies, pulse oximetry and re-evaluation of patient's condition.   Procedures   MEDICATIONS ORDERED IN ED: Medications  acetaminophen  (TYLENOL) tablet 650 mg (has no administration in time range)    Or  acetaminophen (TYLENOL) suppository 650 mg (has no administration in time range)  ondansetron (ZOFRAN) tablet 4 mg (has no administration in time range)    Or  ondansetron (ZOFRAN) injection 4 mg (has no administration in time range)  lactated ringers infusion ( Intravenous New Bag/Given 01/15/24 1320)  insulin aspart (novoLOG) injection 0-9 Units (has no administration in time range)  insulin aspart (novoLOG) injection 0-5 Units (has no administration in time range)  dextrose 50 % solution 50 mL (has no administration in time range)  hydrALAZINE (APRESOLINE) injection 5 mg (has no administration in time range)  naloxone HCl (NARCAN) 4 mg in dextrose 5 % 250 mL infusion (has no administration in time range)  rosuvastatin (CRESTOR) tablet 20 mg (has no administration in time range)  0.9 %  sodium chloride infusion (0 mLs Intravenous Stopped 01/15/24 1153)  naloxone (NARCAN) injection 0.4 mg (0.4 mg Intravenous Given 01/15/24 1139)  sodium chloride 0.9 % bolus 1,000 mL (0 mLs Intravenous Stopped 01/15/24 1153)  naloxone (NARCAN) injection 0.4 mg (0.4 mg Intravenous Given 01/15/24 1315)  naloxone (NARCAN) injection 0.4 mg (2 mg Intravenous Given 01/15/24 1318)     IMPRESSION / MDM / ASSESSMENT AND PLAN / ED COURSE  I reviewed the triage vital signs and the nursing notes. Patient's presentation is most consistent with acute presentation with potential threat to life or bodily function.  Patient presents with altered mental status as described above.  Found to be hypotensive here, IV fluid bolus started immediately.  Differential is extensive includes ICH, sepsis, electrolyte abnormality, dehydration  ----------------------------------------- 11:40 AM on 01/15/2024 ----------------------------------------- Patient with little improvement in blood pressure after 1 L of IV fluids unclear cause of hypotension at this point, will  give an additional liter of fluids will order a unit of PRBCs.  Patient does have pinpoint pupils will trial Narcan as well.  If no improvement after 2 L of IV fluids will start pressors  ----------------------------------------- 11:47 AM on 01/15/2024 ----------------------------------------- Patient's creatinine is 6, this is likely the cause of his hypotension, he also received Narcan and seemed to improve significantly as well.  Blood pressure was markedly improved after Narcan  Discussed with hospitalist for admission        FINAL CLINICAL IMPRESSION(S) / ED DIAGNOSES   Final diagnoses:  Acute kidney injury (HCC)  Opioid intoxication with complication (HCC)     Rx / DC Orders   ED Discharge Orders     None  Note:  This document was prepared using Dragon voice recognition software and may include unintentional dictation errors.   Bryson Carbine, MD 01/15/24 [phone]

## 2024-01-15 NOTE — ED Notes (Addendum)
   01/15/24 1045  Vitals  BP (!) 51/34  MAP (mmHg) (!) 40  Pulse Rate (!) 53  ECG Heart Rate (!) 54  Resp 11  MEWS COLOR  MEWS Score Color Red  Oxygen Therapy  SpO2 96 %  MEWS Score  MEWS Temp 0  MEWS Systolic 3  MEWS Pulse 0  MEWS RR 1  MEWS LOC 0  MEWS Score 4   Kinner MD made aware and is bedside

## 2024-01-15 NOTE — Assessment & Plan Note (Addendum)
 Patient currently low normotensive, no scheduled antihypertensive medications will be initiated on admission Hydralazine 5 mg IV every 6 hours as needed for SBP greater 170, 5 days ordered

## 2024-01-15 NOTE — ED Notes (Signed)
   01/15/24 1115  Vitals  BP (!) 65/42  MAP (mmHg) (!) 51  ECG Heart Rate 61  Resp 18  MEWS COLOR  MEWS Score Color Yellow  MEWS Score  MEWS Temp 0  MEWS Systolic 3  MEWS Pulse 0  MEWS RR 0  MEWS LOC 0  MEWS Score 3   Kinner MD bedside

## 2024-01-15 NOTE — ED Notes (Signed)
   01/15/24 1315  Vitals  BP (!) 72/51  MAP (mmHg) (!) 58  Pulse Rate 65  ECG Heart Rate 66  Resp 15  MEWS COLOR  MEWS Score Color Yellow  Oxygen Therapy  SpO2 95 %  MEWS Score  MEWS Temp 0  MEWS Systolic 2  MEWS Pulse 0  MEWS RR 0  MEWS LOC 0  MEWS Score 2   Dr. Reinhold Carbine bedside

## 2024-01-15 NOTE — Assessment & Plan Note (Signed)
 Insulin SSI with at bedtime coverage ordered, renal dosing
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
## 2024-01-15 NOTE — Assessment & Plan Note (Addendum)
 This is likely multifactorial in setting of polypharmacy with unintentional opioid overdose (oxycodone 20 mg, 160 tablets for 30-day supply plus gabapentin prescription) and AKI on CKD 3b, recent hospitalization for diarrhea resulting in GI loss along with poor p.o. intake and in setting of acute on chronic anemia requiring blood transfusion Status post sodium chloride 2 L bolus per EDP On admission I have initiated LR infusion at 150 mL/h Maintain MAP greater than 65 Addendum: Blood pressure decreased to 72/51 (MAP 58).  I was messaged by nursing staff.  Additional dose Narcan 0.4 mg IV one-time dose ordered.  Narcan gtt. initiated on admission.

## 2024-01-15 NOTE — Assessment & Plan Note (Signed)
 Nonfasting blood glucose was 76 on admission D50, 1 amp as needed for hypoglycemia, when CBG is less than 70, 5 days of coverage ordered on admission

## 2024-01-15 NOTE — ED Triage Notes (Signed)
 Pt here via [facility] from home with weakness and a fall yesterday. Pt hit his head when he fell, unsure of LOC or blood thinners. Pt's son states that pt is not at his baseline but able to recall his name, DOB, and place. Pt was recently admitted to this facility for sepsis. Pt able to stand and pivot x1.    106/80 58 HR 99% RA 133-cbg

## 2024-01-15 NOTE — ED Notes (Signed)
   01/15/24 1435  Vitals  Temp 97.7 F (36.5 C)  Temp Source Oral  BP 95/72  Pulse Rate 70  ECG Heart Rate 70  Resp 17  MEWS COLOR  MEWS Score Color Green  Oxygen Therapy  SpO2 98 %  MEWS Score  MEWS Temp 0  MEWS Systolic 1  MEWS Pulse 0  MEWS RR 0  MEWS LOC 0  MEWS Score 1   15 minute vital check for blood administration

## 2024-01-16 DIAGNOSIS — E1122 Type 2 diabetes mellitus with diabetic chronic kidney disease: Secondary | ICD-10-CM

## 2024-01-16 DIAGNOSIS — F418 Other specified anxiety disorders: Secondary | ICD-10-CM

## 2024-01-16 DIAGNOSIS — N183 Chronic kidney disease, stage 3 unspecified: Secondary | ICD-10-CM

## 2024-01-16 DIAGNOSIS — E861 Hypovolemia: Secondary | ICD-10-CM

## 2024-01-16 DIAGNOSIS — Z79899 Other long term (current) drug therapy: Secondary | ICD-10-CM

## 2024-01-16 DIAGNOSIS — G894 Chronic pain syndrome: Secondary | ICD-10-CM

## 2024-01-16 DIAGNOSIS — Y92009 Unspecified place in unspecified non-institutional (private) residence as the place of occurrence of the external cause: Secondary | ICD-10-CM

## 2024-01-16 DIAGNOSIS — J449 Chronic obstructive pulmonary disease, unspecified: Secondary | ICD-10-CM | POA: Diagnosis not present

## 2024-01-16 DIAGNOSIS — E785 Hyperlipidemia, unspecified: Secondary | ICD-10-CM

## 2024-01-16 DIAGNOSIS — W19XXXA Unspecified fall, initial encounter: Secondary | ICD-10-CM

## 2024-01-16 DIAGNOSIS — R531 Weakness: Secondary | ICD-10-CM

## 2024-01-16 DIAGNOSIS — I10 Essential (primary) hypertension: Secondary | ICD-10-CM

## 2024-01-16 DIAGNOSIS — D696 Thrombocytopenia, unspecified: Secondary | ICD-10-CM

## 2024-01-16 DIAGNOSIS — E162 Hypoglycemia, unspecified: Secondary | ICD-10-CM

## 2024-01-16 DIAGNOSIS — T402X1A Poisoning by other opioids, accidental (unintentional), initial encounter: Secondary | ICD-10-CM

## 2024-01-16 DIAGNOSIS — N179 Acute kidney failure, unspecified: Secondary | ICD-10-CM | POA: Diagnosis not present

## 2024-01-16 DIAGNOSIS — K219 Gastro-esophageal reflux disease without esophagitis: Secondary | ICD-10-CM

## 2024-01-16 DIAGNOSIS — Z720 Tobacco use: Secondary | ICD-10-CM

## 2024-01-16 LAB — TYPE AND SCREEN
ABO/RH(D): A POS
Antibody Screen: NEGATIVE
Unit division: 0

## 2024-01-16 LAB — BASIC METABOLIC PANEL WITH GFR
Anion gap: 7 (ref 5–15)
BUN: 53 mg/dL — ABNORMAL HIGH (ref 8–23)
CO2: 20 mmol/L — ABNORMAL LOW (ref 22–32)
Calcium: 7.9 mg/dL — ABNORMAL LOW (ref 8.9–10.3)
Chloride: 117 mmol/L — ABNORMAL HIGH (ref 98–111)
Creatinine, Ser: 3.89 mg/dL — ABNORMAL HIGH (ref 0.61–1.24)
GFR, Estimated: 16 mL/min — ABNORMAL LOW (ref >60)
Glucose, Bld: 92 mg/dL (ref 70–99)
Potassium: 3.4 mmol/L — ABNORMAL LOW (ref 3.5–5.1)
Sodium: 144 mmol/L (ref 135–145)

## 2024-01-16 LAB — HEMOGLOBIN A1C
Hgb A1c MFr Bld: 4.2 % — ABNORMAL LOW (ref 4.8–5.6)
Mean Plasma Glucose: 73.84 mg/dL

## 2024-01-16 LAB — CBC
HCT: 26.1 % — ABNORMAL LOW (ref 39.0–52.0)
Hemoglobin: 8.7 g/dL — ABNORMAL LOW (ref 13.0–17.0)
MCH: 33.7 pg (ref 26.0–34.0)
MCHC: 33.3 g/dL (ref 30.0–36.0)
MCV: 101.2 fL — ABNORMAL HIGH (ref 80.0–100.0)
Platelets: 59 10*3/uL — ABNORMAL LOW (ref 150–400)
RBC: 2.58 MIL/uL — ABNORMAL LOW (ref 4.22–5.81)
RDW: 16.2 % — ABNORMAL HIGH (ref 11.5–15.5)
WBC: 3.4 10*3/uL — ABNORMAL LOW (ref 4.0–10.5)
nRBC: 0 % (ref 0.0–0.2)

## 2024-01-16 LAB — BPAM RBC
Blood Product Expiration Date: 202505132359
ISSUE DATE / TIME: 202504171415
Unit Type and Rh: 6200

## 2024-01-16 LAB — GLUCOSE, CAPILLARY
Glucose-Capillary: 78 mg/dL (ref 70–99)
Glucose-Capillary: 105 mg/dL — ABNORMAL HIGH (ref 70–99)
Glucose-Capillary: 103 mg/dL — ABNORMAL HIGH (ref 70–99)
Glucose-Capillary: 109 mg/dL — ABNORMAL HIGH (ref 70–99)

## 2024-01-16 MED ORDER — ROFLUMILAST 500 MCG PO TABS
250.0000 ug | ORAL_TABLET | Freq: Every day | ORAL | Status: DC
  Administered 2024-01-17 – 2024-01-19 (×3): 250 ug via ORAL
  Filled 2024-01-16 (×3): qty 1

## 2024-01-16 MED ORDER — POTASSIUM CHLORIDE CRYS ER 20 MEQ PO TBCR
40.0000 meq | EXTENDED_RELEASE_TABLET | Freq: Once | ORAL | Status: AC
  Administered 2024-01-16: 40 meq via ORAL
  Filled 2024-01-16: qty 2

## 2024-01-16 MED ORDER — BUDESON-GLYCOPYRROL-FORMOTEROL 160-9-4.8 MCG/ACT IN AERO
2.0000 | INHALATION_SPRAY | Freq: Two times a day (BID) | RESPIRATORY_TRACT | Status: DC
  Administered 2024-01-19: 2 via RESPIRATORY_TRACT
  Filled 2024-01-16 (×2): qty 5.9

## 2024-01-16 MED ORDER — LACTULOSE 10 GM/15ML PO SOLN
20.0000 g | Freq: Two times a day (BID) | ORAL | Status: DC
  Administered 2024-01-16 – 2024-01-19 (×7): 20 g via ORAL
  Filled 2024-01-16 (×8): qty 30

## 2024-01-16 MED ORDER — PANTOPRAZOLE SODIUM 40 MG PO TBEC
40.0000 mg | DELAYED_RELEASE_TABLET | Freq: Every day | ORAL | Status: DC
  Administered 2024-01-16 – 2024-01-19 (×4): 40 mg via ORAL
  Filled 2024-01-16 (×4): qty 1

## 2024-01-16 MED ORDER — DIVALPROEX SODIUM ER 500 MG PO TB24
1000.0000 mg | ORAL_TABLET | Freq: Every day | ORAL | Status: DC
  Administered 2024-01-16 – 2024-01-19 (×4): 1000 mg via ORAL
  Filled 2024-01-16 (×4): qty 2

## 2024-01-16 NOTE — Evaluation (Signed)
 Occupational Therapy Evaluation Patient Details Name: Albert Figueroa MRN: [id number] DOB: Apr 09, 1956 Today's Date: 01/16/2024   History of Present Illness   Patient is a 68 year old male with weakness and fall. Found to have AKI. History of GERD, hyperlipidemia, memory decline, thrombocytopenia, chronic pain syndrome, generalized weakness, Lewy body dementia, brain shunt     Clinical Impressions Patient presenting with decreased Ind in self care,balance, functional mobility/transfers, endurance, and safety awareness.  Patient reports living at home with sister but " it isn't going well". Pt endorses being Mod I at baseline with use of RW. Pt is poor historian and no family present to confirm baseline. Pt believes he has recently had a surgery and that is why he is in hospital. Patient currently functioning at min A overall with use of RW. Pt was able to be removed from O2 and placed on RA during session with O2 saturation remaining at or above 94% throughout.  Patient will benefit from acute OT to increase overall independence in the areas of ADLs, functional mobility, and safety awareness in order to safely discharge.     If plan is discharge home, recommend the following:   Supervision due to cognitive status;Help with stairs or ramp for entrance;Direct supervision/assist for financial management;Direct supervision/assist for medications management;Assistance with cooking/housework;A little help with walking and/or transfers;A little help with bathing/dressing/bathroom;Assist for transportation     Functional Status Assessment   Patient has had a recent decline in their functional status and demonstrates the ability to make significant improvements in function in a reasonable and predictable amount of time.     Equipment Recommendations   Other (comment) (defer to next venue of care)      Precautions/Restrictions   Precautions Precautions: Fall     Mobility Bed  Mobility Overal bed mobility: Needs Assistance Bed Mobility: Supine to Sit, Sit to Supine     Supine to sit: Min assist          Transfers Overall transfer level: Needs assistance Equipment used: Rolling walker (2 wheels) Transfers: Sit to/from Stand Sit to Stand: Min assist                      ADL either performed or assessed with clinical judgement   ADL Overall ADL's : Needs assistance/impaired Eating/Feeding: Set up;Sitting                       Toilet Transfer: Minimal assistance;Rolling walker (2 wheels) Toilet Transfer Details (indicate cue type and reason): simulated         Functional mobility during ADLs: Cueing for sequencing;Minimal assistance       Vision Patient Visual Report: No change from baseline              Pertinent Vitals/Pain Pain Assessment Pain Assessment: Faces Faces Pain Scale: Hurts little more Pain Location: BLEs Pain Descriptors / Indicators: Discomfort Pain Intervention(s): Limited activity within patient's tolerance, Monitored during session, Repositioned     Extremity/Trunk Assessment Upper Extremity Assessment Upper Extremity Assessment: Generalized weakness   Lower Extremity Assessment Lower Extremity Assessment: Generalized weakness       Communication Communication Communication: Impaired Factors Affecting Communication: Reduced clarity of speech;Hearing impaired   Cognition Arousal: Alert Behavior During Therapy: WFL for tasks assessed/performed, Impulsive Cognition: History of cognitive impairments, Cognition impaired  Following commands: Intact Following commands impaired: Follows one step commands with increased time     Cueing  General Comments   Cueing Techniques: Verbal cues;Tactile cues;Visual cues              Home Living Family/patient expects to be discharged to:: Private residence Living Arrangements: Other relatives  (sister) Available Help at Discharge: Family;Available PRN/intermittently;Friend(s) Type of Home: House Home Access: Stairs to enter [employer] of Steps: 2 stairs at back entrance   Home Layout: One level     Bathroom Shower/Tub: Chief Strategy Officer: Standard         Additional Comments: patient reports he lives with his sister and it's "not going well"      Prior Functioning/Environment Prior Level of Function : Independent/Modified Independent;History of Falls (last six months);Patient poor historian/Family not available             Mobility Comments: using rolling walker for ambulation. he reports he lost his balance posteriorly      OT Problem List: Impaired balance (sitting and/or standing);Decreased cognition;Decreased safety awareness;Decreased knowledge of use of DME or AE;Decreased knowledge of precautions;Decreased activity tolerance   OT Treatment/Interventions: Self-care/ADL training;Therapeutic exercise;Neuromuscular education;Therapeutic activities;Patient/family education;Balance training;Energy conservation;DME and/or AE instruction;Cognitive remediation/compensation      OT Goals(Current goals can be found in the care plan section)   Acute Rehab OT Goals Patient Stated Goal: to go home Time For Goal Achievement: 01/30/24 Potential to Achieve Goals: Fair   OT Frequency:  Min 2X/week       AM-PAC OT "6 Clicks" Daily Activity     Outcome Measure Help from another person eating meals?: None Help from another person taking care of personal grooming?: None Help from another person toileting, which includes using toliet, bedpan, or urinal?: A Little Help from another person bathing (including washing, rinsing, drying)?: A Little Help from another person to put on and taking off regular upper body clothing?: A Little Help from another person to put on and taking off regular lower body clothing?: A Little 6 Click Score: 20    End of Session Equipment Utilized During Treatment: Rolling walker (2 wheels)  Activity Tolerance: Patient tolerated treatment well Patient left: in chair;with call bell/phone within reach;with chair alarm set  OT Visit Diagnosis: Other symptoms and signs involving cognitive function;History of falling (Z91.81);Unsteadiness on feet (R26.81);Other abnormalities of gait and mobility (R26.89)                Time: 7829-5621 OT Time Calculation (min): 22 min Charges:  OT General Charges $OT Visit: 1 Visit OT Evaluation $OT Eval Moderate Complexity: 1 [address] [place], OTR/L , CBIS ascom [phone]  01/16/24, 1:12 PM
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
## 2024-01-16 NOTE — Evaluation (Signed)
 Physical Therapy Evaluation Patient Details Name: Albert Figueroa MRN: [id number] DOB: Oct 07, 1955 Today's Date: 01/16/2024  History of Present Illness  Patient is a 68 year old male with weakness and fall. Found to have AKI. History of GERD, hyperlipidemia, memory decline, thrombocytopenia, chronic pain syndrome, generalized weakness, Lewy body dementia, brain shunt  Clinical Impression  Patient is agreeable to PT evaluation. He is confused but cooperative. He reports he lives with his sister and uses a rolling walker for ambulation. History of falls.  Today the patient required Min A for bed mobility, transfers, and short distance ambulation to the chair. Mild dizziness reported initially with sitting upright with vitals stable throughout session. Anticipate the patient could benefit from rehabilitation < 3 hours/day after this hospital stay. PT will continue to follow to maximize independence and decrease caregiver burden.       If plan is discharge home, recommend the following: A little help with walking and/or transfers;A little help with bathing/dressing/bathroom;Assist for transportation;Help with stairs or ramp for entrance;Supervision due to cognitive status;Assistance with cooking/housework   Can travel by private vehicle   Yes    Equipment Recommendations None recommended by PT  Recommendations for Other Services       Functional Status Assessment Patient has had a recent decline in their functional status and demonstrates the ability to make significant improvements in function in a reasonable and predictable amount of time.     Precautions / Restrictions Precautions Precautions: Fall Recall of Precautions/Restrictions: Impaired Restrictions Weight Bearing Restrictions Per Provider Order: No      Mobility  Bed Mobility Overal bed mobility: Needs Assistance Bed Mobility: Supine to Sit, Sit to Supine     Supine to sit: Min assist     General bed mobility comments:  verbal cues for sequencing and task initiation    Transfers Overall transfer level: Needs assistance Equipment used: Rolling walker (2 wheels) Transfers: Sit to/from Stand Sit to Stand: Min assist           General transfer comment: Min A for lifting assistance. cues for hand placement    Ambulation/Gait             Pre-gait activities: patient walked 2-3 ft from bed to chair with the rolling walker with steadying assistance provided. patient refused further ambulation until after he eats    Acupuncturist Bed    Modified Rankin (Stroke Patients Only)       Balance Overall balance assessment: Needs assistance Sitting-balance support: Feet supported Sitting balance-Leahy Scale: Fair     Standing balance support: Bilateral upper extremity supported Standing balance-Leahy Scale: Poor Standing balance comment: using rolling walker for support                             Pertinent Vitals/Pain Pain Assessment Pain Assessment: Faces Faces Pain Scale: Hurts little more Pain Location: legs Pain Descriptors / Indicators: Discomfort Pain Intervention(s): Limited activity within patient's tolerance, Monitored during session, Repositioned    Home Living Family/patient expects to be discharged to:: Private residence Living Arrangements: Other relatives (sister) Available Help at Discharge: Family;Available PRN/intermittently;Friend(s) Type of Home: House Home Access: Stairs to enter       Home Layout: One level   Additional Comments: patient reports he lives with his sister and it's "not going well"    Prior Function Prior Level of  Function : Independent/Modified Independent;History of Falls (last six months)             Mobility Comments: using rolling walker for ambulation. he reports he lost his balance posteriorly       Extremity/Trunk Assessment   Upper Extremity Assessment Upper Extremity  Assessment: Generalized weakness    Lower Extremity Assessment Lower Extremity Assessment: Generalized weakness       Communication   Communication Communication: No apparent difficulties Factors Affecting Communication: Reduced clarity of speech    Cognition Arousal: Alert Behavior During Therapy: WFL for tasks assessed/performed, Impulsive   PT - Cognitive impairments: History of cognitive impairments                       PT - Cognition Comments: Patient is disoriented to time, situation. He is able to follow single step commands with increased time. Cues for attention to task and occasional multi modal techniques needed Following commands: Intact       Cueing Cueing Techniques: Verbal cues, Tactile cues, Visual cues     General Comments      Exercises     Assessment/Plan    PT Assessment Patient needs continued PT services  PT Problem List Decreased strength;Decreased range of motion;Decreased activity tolerance;Decreased balance;Decreased mobility;Decreased knowledge of precautions;Decreased safety awareness       PT Treatment Interventions DME instruction;Gait training;Stair training;Functional mobility training;Therapeutic activities;Therapeutic exercise;Balance training;Neuromuscular re-education;Cognitive remediation;Patient/family education;Manual techniques    PT Goals (Current goals can be found in the Care Plan section)  Acute Rehab PT Goals Patient Stated Goal: to go home PT Goal Formulation: With patient Time For Goal Achievement: 01/30/24 Potential to Achieve Goals: Fair    Frequency Min 2X/week     Co-evaluation               AM-PAC PT "6 Clicks" Mobility  Outcome Measure Help needed turning from your back to your side while in a flat bed without using bedrails?: A Little Help needed moving from lying on your back to sitting on the side of a flat bed without using bedrails?: A Little Help needed moving to and from a bed to a  chair (including a wheelchair)?: A Little Help needed standing up from a chair using your arms (e.g., wheelchair or bedside chair)?: A Little Help needed to walk in hospital room?: A Lot Help needed climbing 3-5 steps with a railing? : A Lot 6 Click Score: 16    End of Session   Activity Tolerance: Patient limited by fatigue Patient left: with chair alarm set;in chair Nurse Communication: Mobility status PT Visit Diagnosis: Muscle weakness (generalized) (M62.81);Unsteadiness on feet (R26.81)    Time: 4034-7425 PT Time Calculation (min) (ACUTE ONLY): 22 min   Charges:   PT Evaluation $PT Eval High Complexity: 1 High   PT General Charges $$ ACUTE PT VISIT: 1 Visit         Ozie Bo, PT, MPT   Erlene Hawks 01/16/2024, 11:21 AM

## 2024-01-17 DIAGNOSIS — N179 Acute kidney failure, unspecified: Secondary | ICD-10-CM | POA: Diagnosis not present

## 2024-01-17 DIAGNOSIS — D509 Iron deficiency anemia, unspecified: Secondary | ICD-10-CM | POA: Diagnosis not present

## 2024-01-17 DIAGNOSIS — E861 Hypovolemia: Secondary | ICD-10-CM | POA: Diagnosis not present

## 2024-01-17 DIAGNOSIS — E1122 Type 2 diabetes mellitus with diabetic chronic kidney disease: Secondary | ICD-10-CM | POA: Diagnosis not present

## 2024-01-17 LAB — GLUCOSE, CAPILLARY
Glucose-Capillary: 113 mg/dL — ABNORMAL HIGH (ref 70–99)
Glucose-Capillary: 83 mg/dL (ref 70–99)
Glucose-Capillary: 87 mg/dL (ref 70–99)
Glucose-Capillary: 88 mg/dL (ref 70–99)
Glucose-Capillary: 90 mg/dL (ref 70–99)

## 2024-01-17 LAB — CBC WITH DIFFERENTIAL/PLATELET
Abs Immature Granulocytes: 0.02 10*3/uL (ref 0.00–0.07)
Basophils Absolute: 0 10*3/uL (ref 0.0–0.1)
Basophils Relative: 1 %
Eosinophils Absolute: 0.1 10*3/uL (ref 0.0–0.5)
Eosinophils Relative: 2 %
HCT: 26.4 % — ABNORMAL LOW (ref 39.0–52.0)
Hemoglobin: 8.9 g/dL — ABNORMAL LOW (ref 13.0–17.0)
Immature Granulocytes: 1 %
Lymphocytes Relative: 36 %
Lymphs Abs: 1 10*3/uL (ref 0.7–4.0)
MCH: 34 pg (ref 26.0–34.0)
MCHC: 33.7 g/dL (ref 30.0–36.0)
MCV: 100.8 fL — ABNORMAL HIGH (ref 80.0–100.0)
Monocytes Absolute: 0.3 10*3/uL (ref 0.1–1.0)
Monocytes Relative: 12 %
Neutro Abs: 1.4 10*3/uL — ABNORMAL LOW (ref 1.7–7.7)
Neutrophils Relative %: 48 %
Platelets: 71 10*3/uL — ABNORMAL LOW (ref 150–400)
RBC: 2.62 MIL/uL — ABNORMAL LOW (ref 4.22–5.81)
RDW: 15.7 % — ABNORMAL HIGH (ref 11.5–15.5)
WBC: 2.8 10*3/uL — ABNORMAL LOW (ref 4.0–10.5)
nRBC: 0 % (ref 0.0–0.2)

## 2024-01-17 LAB — PHOSPHORUS: Phosphorus: 2.5 mg/dL (ref 2.5–4.6)

## 2024-01-17 LAB — COMPREHENSIVE METABOLIC PANEL WITH GFR
ALT: 25 U/L (ref 0–44)
AST: 87 U/L — ABNORMAL HIGH (ref 15–41)
Albumin: 2.3 g/dL — ABNORMAL LOW (ref 3.5–5.0)
Alkaline Phosphatase: 50 U/L (ref 38–126)
Anion gap: 10 (ref 5–15)
BUN: 38 mg/dL — ABNORMAL HIGH (ref 8–23)
CO2: 22 mmol/L (ref 22–32)
Calcium: 8.5 mg/dL — ABNORMAL LOW (ref 8.9–10.3)
Chloride: 115 mmol/L — ABNORMAL HIGH (ref 98–111)
Creatinine, Ser: 2.77 mg/dL — ABNORMAL HIGH (ref 0.61–1.24)
GFR, Estimated: 24 mL/min — ABNORMAL LOW (ref >60)
Glucose, Bld: 93 mg/dL (ref 70–99)
Potassium: 4.2 mmol/L (ref 3.5–5.1)
Sodium: 147 mmol/L — ABNORMAL HIGH (ref 135–145)
Total Bilirubin: 1.2 mg/dL (ref 0.0–1.2)
Total Protein: 5.6 g/dL — ABNORMAL LOW (ref 6.5–8.1)

## 2024-01-17 LAB — MAGNESIUM: Magnesium: 2.3 mg/dL (ref 1.7–2.4)
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
## 2024-01-18 DIAGNOSIS — N179 Acute kidney failure, unspecified: Secondary | ICD-10-CM | POA: Diagnosis not present

## 2024-01-18 DIAGNOSIS — E861 Hypovolemia: Secondary | ICD-10-CM | POA: Diagnosis not present

## 2024-01-18 DIAGNOSIS — D509 Iron deficiency anemia, unspecified: Secondary | ICD-10-CM | POA: Diagnosis not present

## 2024-01-18 DIAGNOSIS — E1122 Type 2 diabetes mellitus with diabetic chronic kidney disease: Secondary | ICD-10-CM | POA: Diagnosis not present

## 2024-01-18 LAB — CBC WITH DIFFERENTIAL/PLATELET
Abs Immature Granulocytes: 0.03 10*3/uL (ref 0.00–0.07)
Basophils Absolute: 0 10*3/uL (ref 0.0–0.1)
Basophils Relative: 1 %
Eosinophils Absolute: 0.1 10*3/uL (ref 0.0–0.5)
Eosinophils Relative: 2 %
HCT: 27.9 % — ABNORMAL LOW (ref 39.0–52.0)
Hemoglobin: 9.2 g/dL — ABNORMAL LOW (ref 13.0–17.0)
Immature Granulocytes: 1 %
Lymphocytes Relative: 31 %
Lymphs Abs: 0.9 10*3/uL (ref 0.7–4.0)
MCH: 33.8 pg (ref 26.0–34.0)
MCHC: 33 g/dL (ref 30.0–36.0)
MCV: 102.6 fL — ABNORMAL HIGH (ref 80.0–100.0)
Monocytes Absolute: 0.4 10*3/uL (ref 0.1–1.0)
Monocytes Relative: 13 %
Neutro Abs: 1.6 10*3/uL — ABNORMAL LOW (ref 1.7–7.7)
Neutrophils Relative %: 52 %
Platelets: 74 10*3/uL — ABNORMAL LOW (ref 150–400)
RBC: 2.72 MIL/uL — ABNORMAL LOW (ref 4.22–5.81)
RDW: 15.1 % (ref 11.5–15.5)
WBC: 3 10*3/uL — ABNORMAL LOW (ref 4.0–10.5)
nRBC: 0 % (ref 0.0–0.2)

## 2024-01-18 LAB — COMPREHENSIVE METABOLIC PANEL WITH GFR
ALT: 25 U/L (ref 0–44)
AST: 63 U/L — ABNORMAL HIGH (ref 15–41)
Albumin: 2.3 g/dL — ABNORMAL LOW (ref 3.5–5.0)
Alkaline Phosphatase: 50 U/L (ref 38–126)
Anion gap: 11 (ref 5–15)
BUN: 29 mg/dL — ABNORMAL HIGH (ref 8–23)
CO2: 22 mmol/L (ref 22–32)
Calcium: 8.5 mg/dL — ABNORMAL LOW (ref 8.9–10.3)
Chloride: 113 mmol/L — ABNORMAL HIGH (ref 98–111)
Creatinine, Ser: 2.04 mg/dL — ABNORMAL HIGH (ref 0.61–1.24)
GFR, Estimated: 35 mL/min — ABNORMAL LOW (ref >60)
Glucose, Bld: 77 mg/dL (ref 70–99)
Potassium: 3.8 mmol/L (ref 3.5–5.1)
Sodium: 146 mmol/L — ABNORMAL HIGH (ref 135–145)
Total Bilirubin: 1.1 mg/dL (ref 0.0–1.2)
Total Protein: 5.9 g/dL — ABNORMAL LOW (ref 6.5–8.1)

## 2024-01-18 LAB — GLUCOSE, CAPILLARY
Glucose-Capillary: 81 mg/dL (ref 70–99)
Glucose-Capillary: 96 mg/dL (ref 70–99)
Glucose-Capillary: 103 mg/dL — ABNORMAL HIGH (ref 70–99)
Glucose-Capillary: 116 mg/dL — ABNORMAL HIGH (ref 70–99)

## 2024-01-18 LAB — MAGNESIUM: Magnesium: 2.2 mg/dL (ref 1.7–2.4)

## 2024-01-18 LAB — PHOSPHORUS: Phosphorus: 2.3 mg/dL — ABNORMAL LOW (ref 2.5–4.6)
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
## 2024-01-18 NOTE — Plan of Care (Signed)
  Problem: Coping: Goal: Ability to adjust to condition or change in health will improve Outcome: Progressing   Problem: Fluid Volume: Goal: Ability to maintain a balanced intake and output will improve Outcome: Progressing   Problem: Metabolic: Goal: Ability to maintain appropriate glucose levels will improve Outcome: Progressing   Problem: Nutritional: Goal: Maintenance of adequate nutrition will improve Outcome: Progressing Goal: Progress toward achieving an optimal weight will improve Outcome: Progressing   Problem: Skin Integrity: Goal: Risk for impaired skin integrity will decrease Outcome: Progressing   Problem: Tissue Perfusion: Goal: Adequacy of tissue perfusion will improve Outcome: Progressing   Problem: Clinical Measurements: Goal: Ability to maintain clinical measurements within normal limits will improve Outcome: Progressing Goal: Will remain free from infection Outcome: Progressing Goal: Diagnostic test results will improve Outcome: Progressing Goal: Respiratory complications will improve Outcome: Progressing Goal: Cardiovascular complication will be avoided Outcome: Progressing   Problem: Activity: Goal: Risk for activity intolerance will decrease Outcome: Progressing   Problem: Nutrition: Goal: Adequate nutrition will be maintained Outcome: Progressing   Problem: Elimination: Goal: Will not experience complications related to urinary retention Outcome: Progressing   Problem: Pain Managment: Goal: General experience of comfort will improve and/or be controlled Outcome: Progressing   Problem: Safety: Goal: Ability to remain free from injury will improve Outcome: Progressing   Problem: Skin Integrity: Goal: Risk for impaired skin integrity will decrease Outcome: Progressing

## 2024-01-19 DIAGNOSIS — E1122 Type 2 diabetes mellitus with diabetic chronic kidney disease: Secondary | ICD-10-CM | POA: Diagnosis not present

## 2024-01-19 DIAGNOSIS — N179 Acute kidney failure, unspecified: Secondary | ICD-10-CM | POA: Diagnosis not present

## 2024-01-19 DIAGNOSIS — D509 Iron deficiency anemia, unspecified: Secondary | ICD-10-CM | POA: Diagnosis not present

## 2024-01-19 DIAGNOSIS — E861 Hypovolemia: Secondary | ICD-10-CM | POA: Diagnosis not present

## 2024-01-19 LAB — COMPREHENSIVE METABOLIC PANEL WITH GFR
ALT: 21 U/L (ref 0–44)
AST: 53 U/L — ABNORMAL HIGH (ref 15–41)
Albumin: 2.2 g/dL — ABNORMAL LOW (ref 3.5–5.0)
Alkaline Phosphatase: 48 U/L (ref 38–126)
Anion gap: 5 (ref 5–15)
BUN: 25 mg/dL — ABNORMAL HIGH (ref 8–23)
CO2: 22 mmol/L (ref 22–32)
Calcium: 8.3 mg/dL — ABNORMAL LOW (ref 8.9–10.3)
Chloride: 117 mmol/L — ABNORMAL HIGH (ref 98–111)
Creatinine, Ser: 1.86 mg/dL — ABNORMAL HIGH (ref 0.61–1.24)
GFR, Estimated: 39 mL/min — ABNORMAL LOW (ref >60)
Glucose, Bld: 95 mg/dL (ref 70–99)
Potassium: 3.9 mmol/L (ref 3.5–5.1)
Sodium: 144 mmol/L (ref 135–145)
Total Bilirubin: 1 mg/dL (ref 0.0–1.2)
Total Protein: 5.7 g/dL — ABNORMAL LOW (ref 6.5–8.1)

## 2024-01-19 LAB — CBC WITH DIFFERENTIAL/PLATELET
Abs Immature Granulocytes: 0.05 10*3/uL (ref 0.00–0.07)
Basophils Absolute: 0 10*3/uL (ref 0.0–0.1)
Basophils Relative: 1 %
Eosinophils Absolute: 0.1 10*3/uL (ref 0.0–0.5)
Eosinophils Relative: 3 %
HCT: 26.6 % — ABNORMAL LOW (ref 39.0–52.0)
Hemoglobin: 8.8 g/dL — ABNORMAL LOW (ref 13.0–17.0)
Immature Granulocytes: 2 %
Lymphocytes Relative: 36 %
Lymphs Abs: 1.1 10*3/uL (ref 0.7–4.0)
MCH: 33.1 pg (ref 26.0–34.0)
MCHC: 33.1 g/dL (ref 30.0–36.0)
MCV: 100 fL (ref 80.0–100.0)
Monocytes Absolute: 0.4 10*3/uL (ref 0.1–1.0)
Monocytes Relative: 13 %
Neutro Abs: 1.4 10*3/uL — ABNORMAL LOW (ref 1.7–7.7)
Neutrophils Relative %: 45 %
Platelets: 85 10*3/uL — ABNORMAL LOW (ref 150–400)
RBC: 2.66 MIL/uL — ABNORMAL LOW (ref 4.22–5.81)
RDW: 14.7 % (ref 11.5–15.5)
WBC: 3 10*3/uL — ABNORMAL LOW (ref 4.0–10.5)
nRBC: 0 % (ref 0.0–0.2)

## 2024-01-19 LAB — GLUCOSE, CAPILLARY
Glucose-Capillary: 86 mg/dL (ref 70–99)
Glucose-Capillary: 104 mg/dL — ABNORMAL HIGH (ref 70–99)

## 2024-01-19 LAB — MAGNESIUM: Magnesium: 2.2 mg/dL (ref 1.7–2.4)

## 2024-01-19 LAB — PHOSPHORUS: Phosphorus: 2.2 mg/dL — ABNORMAL LOW (ref 2.5–4.6)

## 2024-01-19 NOTE — Plan of Care (Signed)

## 2024-01-19 NOTE — Care Management Important Message (Signed)
 Important Message  Patient Details  Name: Albert Figueroa MRN: [id number] Date of Birth: [birth year]   Important Message Given:  Yes - Medicare IM     Joaopedro Eschbach W, CMA 01/19/2024, 10:33 AM

## 2024-01-19 NOTE — Progress Notes (Signed)
 Physical Therapy Treatment Patient Details Name: Albert Figueroa MRN: [id number] DOB: [birth year] Today's Date: 01/19/2024   History of Present Illness Patient is a 68 year old male with weakness and fall. Found to have AKI. History of GERD, hyperlipidemia, memory decline, thrombocytopenia, chronic pain syndrome, generalized weakness, Lewy body dementia, brain shunt    PT Comments  Pt initially agreeable to session reporting he is still feeling weakness due to lack of mobility. Encouraged and guided B LE there-ex in bed. Once finished, encouraged OOB mobility, however pt reports he just starting watching an episode on history channel and doesn't want to get OOB at this time. Encouraged pt to sit in recliner, refuses and wishes to take a nap in bed. Educated on mobility and pt reports he will try again tomorrow, but is too tired. Will continue attempts as able.    If plan is discharge home, recommend the following: A little help with walking and/or transfers;A little help with bathing/dressing/bathroom;Assist for transportation;Help with stairs or ramp for entrance;Supervision due to cognitive status;Assistance with cooking/housework   Can travel by private vehicle     Yes  Equipment Recommendations  None recommended by PT    Recommendations for Other Services       Precautions / Restrictions Precautions Precautions: Fall Recall of Precautions/Restrictions: Impaired Restrictions Weight Bearing Restrictions Per Provider Order: No     Mobility  Bed Mobility               General bed mobility comments: declined    Transfers                        Ambulation/Gait                   Stairs             Wheelchair Mobility     Tilt Bed    Modified Rankin (Stroke Patients Only)       Balance                                            Communication Communication Communication: No apparent difficulties  Cognition Arousal:  Alert Behavior During Therapy: WFL for tasks assessed/performed, Impulsive   PT - Cognitive impairments: History of cognitive impairments                       PT - Cognition Comments: pt alert to situation, able to recall the events of earlier in morning. Pt only agreeable to bed level session Following commands: Intact Following commands impaired: Follows one step commands with increased time    Cueing Cueing Techniques: Verbal cues, Tactile cues, Visual cues  Exercises Other Exercises Other Exercises: only agreeable to supine ther-ex including SLRs, knee flexion, and hip abd/add. 10 reps with supervision.    General Comments        Pertinent Vitals/Pain Pain Assessment Pain Assessment: No/denies pain    Home Living                          Prior Function            PT Goals (current goals can now be found in the care plan section) Acute Rehab PT Goals Patient Stated Goal: to go home PT Goal Formulation: With patient Time For Goal  Achievement: 01/30/24 Potential to Achieve Goals: Fair Progress towards PT goals: Progressing toward goals    Frequency    Min 2X/week      PT Plan      Co-evaluation              AM-PAC PT "6 Clicks" Mobility   Outcome Measure  Help needed turning from your back to your side while in a flat bed without using bedrails?: A Little Help needed moving from lying on your back to sitting on the side of a flat bed without using bedrails?: A Little Help needed moving to and from a bed to a chair (including a wheelchair)?: A Little Help needed standing up from a chair using your arms (e.g., wheelchair or bedside chair)?: A Little Help needed to walk in hospital room?: A Lot Help needed climbing 3-5 steps with a railing? : A Lot 6 Click Score: 16    End of Session   Activity Tolerance: Patient limited by fatigue Patient left: in bed;with bed alarm set (refused OOB mobility) Nurse Communication: Mobility  status PT Visit Diagnosis: Muscle weakness (generalized) (M62.81);Unsteadiness on feet (R26.81)     Time: 1610-9604 PT Time Calculation (min) (ACUTE ONLY): 11 min  Charges:    $Therapeutic Exercise: 8-22 mins PT General Charges $$ ACUTE PT VISIT: 1 Visit                     Amparo Balk, PT, DPT, GCS [phone]    Kyrstin Campillo 01/19/2024, 11:46 AM

## 2024-01-19 NOTE — TOC Transition Note (Signed)
 Transition of Care [facility]) - Discharge Note   Patient Details  Name: Albert Figueroa MRN: [id number] Date of Birth: 1956/02/14  [facility] CM/SW Contact:  Elsie Halo, RN Phone Number: 01/19/2024, 3:47 PM   Clinical Narrative:     Patient is medically clear for dc to home with home health per the MD and has capacity to decide where he can go. The patient chooses to go home. TOC spoke with the patient's son Siegfried Dress [phone] to notify of the patient's discharge and offer choice for [facility].Referral for Home Health PT/OT (OT assistant), and SW called to [facility] and [facility] accepted the referral. Siegfried Dress will transport the patient but it will be a few hours before he gets off from work. No other TOC needs identified.   Final next level of care: Home w Home Health Services Barriers to Discharge: Continued Medical Work up   Patient Goals and CMS Choice            Discharge Placement                       Discharge Plan and Services Additional resources added to the After Visit Summary for                            [facility] Arranged: PT, OT, Social Work, Nurse's Aide HH Agency: [facility] Date HH Agency Contacted: 01/19/24 Time HH Agency Contacted: 1547 Representative spoke with at [facility] Agency: [facility]  Social Drivers of Health (SDOH) Interventions SDOH Screenings   Food Insecurity: No Food Insecurity (12/30/2023)  Recent Concern: Food Insecurity - Food Insecurity Present (10/06/2023)   Received from [facility]  Housing: Low Risk  (12/30/2023)  Transportation Needs: No Transportation Needs (12/30/2023)  Utilities: Not At Risk (12/30/2023)  Financial Resource Strain: High Risk (10/06/2023)   Received from [facility]  Social Connections: Unknown (12/30/2023)  Tobacco Use: High Risk (01/15/2024)     Readmission Risk Interventions    12/30/2023   12:01 PM 03/06/2023    3:23 PM  Readmission Risk Prevention Plan   Transportation Screening Complete Complete  PCP or Specialist Appt within 3-5 Days Complete Complete  HRI or Home Care Consult  --  Social Work Consult for Recovery Care Planning/Counseling Complete Complete  Palliative Care Screening Not Applicable Not Applicable  Medication Review Oceanographer) Complete Complete
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
## 2024-01-20 LAB — CULTURE, BLOOD (SINGLE): Culture: NO GROWTH

## 2024-01-21 ENCOUNTER — Encounter: Payer: Self-pay | Admitting: Acute Care

## 2024-02-02 NOTE — Progress Notes (Signed)
 Query response. While admitted pt was found to have: - Acute toxic encephalopathy superimposed on dementia

## 2024-02-02 NOTE — Progress Notes (Signed)
 Query response. - While admitted patient suffered from hypotension secondary to opioid overdose

## 2024-02-13 ENCOUNTER — Ambulatory Visit: Admission: RE | Admit: 2024-02-13 | Source: Ambulatory Visit

## 2024-03-05 ENCOUNTER — Observation Stay
Admission: EM | Admit: 2024-03-05 | Discharge: 2024-03-08 | Disposition: A | Discharge status: Home / Self Care | Attending: Internal Medicine | Admitting: Internal Medicine

## 2024-03-05 DIAGNOSIS — N179 Acute kidney failure, unspecified: Secondary | ICD-10-CM | POA: Diagnosis not present

## 2024-03-05 DIAGNOSIS — F02B2 Dementia in other diseases classified elsewhere, moderate, with psychotic disturbance: Secondary | ICD-10-CM | POA: Insufficient documentation

## 2024-03-05 DIAGNOSIS — E1129 Type 2 diabetes mellitus with other diabetic kidney complication: Secondary | ICD-10-CM | POA: Diagnosis present

## 2024-03-05 DIAGNOSIS — W19XXXA Unspecified fall, initial encounter: Secondary | ICD-10-CM | POA: Insufficient documentation

## 2024-03-05 DIAGNOSIS — R42 Dizziness and giddiness: Secondary | ICD-10-CM | POA: Diagnosis present

## 2024-03-05 DIAGNOSIS — R001 Bradycardia, unspecified: Secondary | ICD-10-CM | POA: Diagnosis not present

## 2024-03-05 DIAGNOSIS — S42125A Nondisplaced fracture of acromial process, left shoulder, initial encounter for closed fracture: Secondary | ICD-10-CM

## 2024-03-05 DIAGNOSIS — S0990XA Unspecified injury of head, initial encounter: Secondary | ICD-10-CM

## 2024-03-05 DIAGNOSIS — Y92009 Unspecified place in unspecified non-institutional (private) residence as the place of occurrence of the external cause: Secondary | ICD-10-CM | POA: Diagnosis not present

## 2024-03-05 DIAGNOSIS — Z79899 Other long term (current) drug therapy: Secondary | ICD-10-CM | POA: Diagnosis not present

## 2024-03-05 DIAGNOSIS — I129 Hypertensive chronic kidney disease with stage 1 through stage 4 chronic kidney disease, or unspecified chronic kidney disease: Secondary | ICD-10-CM | POA: Insufficient documentation

## 2024-03-05 DIAGNOSIS — Z9189 Other specified personal risk factors, not elsewhere classified: Secondary | ICD-10-CM

## 2024-03-05 DIAGNOSIS — J449 Chronic obstructive pulmonary disease, unspecified: Secondary | ICD-10-CM | POA: Diagnosis not present

## 2024-03-05 DIAGNOSIS — I951 Orthostatic hypotension: Secondary | ICD-10-CM | POA: Diagnosis not present

## 2024-03-05 DIAGNOSIS — Z87898 Personal history of other specified conditions: Secondary | ICD-10-CM

## 2024-03-05 DIAGNOSIS — T50915A Adverse effect of multiple unspecified drugs, medicaments and biological substances, initial encounter: Secondary | ICD-10-CM | POA: Insufficient documentation

## 2024-03-05 DIAGNOSIS — E119 Type 2 diabetes mellitus without complications: Secondary | ICD-10-CM | POA: Insufficient documentation

## 2024-03-05 DIAGNOSIS — F1110 Opioid abuse, uncomplicated: Secondary | ICD-10-CM | POA: Diagnosis not present

## 2024-03-05 DIAGNOSIS — G40909 Epilepsy, unspecified, not intractable, without status epilepticus: Secondary | ICD-10-CM | POA: Diagnosis not present

## 2024-03-05 DIAGNOSIS — N1832 Chronic kidney disease, stage 3b: Secondary | ICD-10-CM | POA: Diagnosis not present

## 2024-03-05 DIAGNOSIS — N189 Chronic kidney disease, unspecified: Secondary | ICD-10-CM | POA: Diagnosis present

## 2024-03-05 DIAGNOSIS — G3183 Dementia with Lewy bodies: Secondary | ICD-10-CM | POA: Diagnosis not present

## 2024-03-05 DIAGNOSIS — G8929 Other chronic pain: Secondary | ICD-10-CM | POA: Diagnosis not present

## 2024-03-05 DIAGNOSIS — E872 Acidosis, unspecified: Secondary | ICD-10-CM | POA: Diagnosis not present

## 2024-03-05 DIAGNOSIS — F02B0 Dementia in other diseases classified elsewhere, moderate, without behavioral disturbance, psychotic disturbance, mood disturbance, and anxiety: Secondary | ICD-10-CM | POA: Diagnosis present

## 2024-03-05 DIAGNOSIS — I959 Hypotension, unspecified: Principal | ICD-10-CM | POA: Diagnosis present

## 2024-03-05 MED ORDER — VANCOMYCIN HCL IN DEXTROSE 1-5 GM/200ML-% IV SOLN
1000.0000 mg | Freq: Once | INTRAVENOUS | Status: AC
  Administered 2024-03-06: 1000 mg via INTRAVENOUS
  Filled 2024-03-05: qty 200

## 2024-03-05 MED ORDER — METRONIDAZOLE 500 MG/100ML IV SOLN
500.0000 mg | Freq: Once | INTRAVENOUS | Status: AC
  Administered 2024-03-06: 500 mg via INTRAVENOUS
  Filled 2024-03-05: qty 100

## 2024-03-05 MED ORDER — SODIUM CHLORIDE 0.9 % IV SOLN
2.0000 g | Freq: Once | INTRAVENOUS | Status: AC
  Administered 2024-03-06: 2 g via INTRAVENOUS
  Filled 2024-03-05: qty 12.5

## 2024-03-05 MED ORDER — ONDANSETRON HCL 4 MG/2ML IJ SOLN
4.0000 mg | Freq: Once | INTRAMUSCULAR | Status: AC
  Administered 2024-03-06: 4 mg via INTRAVENOUS
  Filled 2024-03-05: qty 2

## 2024-03-05 MED ORDER — FENTANYL CITRATE PF 50 MCG/ML IJ SOSY
50.0000 ug | PREFILLED_SYRINGE | Freq: Once | INTRAMUSCULAR | Status: AC
  Administered 2024-03-06: 50 ug via INTRAVENOUS
  Filled 2024-03-05: qty 1

## 2024-03-05 MED ORDER — LACTATED RINGERS IV BOLUS (SEPSIS)
1000.0000 mL | Freq: Once | INTRAVENOUS | Status: AC
  Administered 2024-03-06: 1000 mL via INTRAVENOUS

## 2024-03-05 MED ORDER — LACTATED RINGERS IV SOLN: INTRAVENOUS | Status: AC

## 2024-03-06 ENCOUNTER — Emergency Department

## 2024-03-06 ENCOUNTER — Other Ambulatory Visit: Payer: Self-pay

## 2024-03-06 DIAGNOSIS — R6520 Severe sepsis without septic shock: Secondary | ICD-10-CM

## 2024-03-06 DIAGNOSIS — R001 Bradycardia, unspecified: Secondary | ICD-10-CM

## 2024-03-06 DIAGNOSIS — A419 Sepsis, unspecified organism: Secondary | ICD-10-CM | POA: Diagnosis not present

## 2024-03-06 DIAGNOSIS — E861 Hypovolemia: Secondary | ICD-10-CM

## 2024-03-06 DIAGNOSIS — Z9189 Other specified personal risk factors, not elsewhere classified: Secondary | ICD-10-CM

## 2024-03-06 DIAGNOSIS — Z87898 Personal history of other specified conditions: Secondary | ICD-10-CM

## 2024-03-06 DIAGNOSIS — S42125A Nondisplaced fracture of acromial process, left shoulder, initial encounter for closed fracture: Secondary | ICD-10-CM

## 2024-03-06 LAB — BASIC METABOLIC PANEL WITH GFR
Anion gap: 9 (ref 5–15)
BUN: 28 mg/dL — ABNORMAL HIGH (ref 8–23)
CO2: 23 mmol/L (ref 22–32)
Calcium: 8.2 mg/dL — ABNORMAL LOW (ref 8.9–10.3)
Chloride: 105 mmol/L (ref 98–111)
Creatinine, Ser: 2.53 mg/dL — ABNORMAL HIGH (ref 0.61–1.24)
GFR, Estimated: 27 mL/min — ABNORMAL LOW (ref >60)
Glucose, Bld: 109 mg/dL — ABNORMAL HIGH (ref 70–99)
Potassium: 3.2 mmol/L — ABNORMAL LOW (ref 3.5–5.1)
Sodium: 137 mmol/L (ref 135–145)

## 2024-03-06 LAB — CBC WITH DIFFERENTIAL/PLATELET
Abs Immature Granulocytes: 0.02 10*3/uL (ref 0.00–0.07)
Basophils Absolute: 0 10*3/uL (ref 0.0–0.1)
Basophils Relative: 1 %
Eosinophils Absolute: 0.1 10*3/uL (ref 0.0–0.5)
Eosinophils Relative: 1 %
HCT: 29.6 % — ABNORMAL LOW (ref 39.0–52.0)
Hemoglobin: 9.4 g/dL — ABNORMAL LOW (ref 13.0–17.0)
Immature Granulocytes: 0 %
Lymphocytes Relative: 29 %
Lymphs Abs: 1.8 10*3/uL (ref 0.7–4.0)
MCH: 33.6 pg (ref 26.0–34.0)
MCHC: 31.8 g/dL (ref 30.0–36.0)
MCV: 105.7 fL — ABNORMAL HIGH (ref 80.0–100.0)
Monocytes Absolute: 0.6 10*3/uL (ref 0.1–1.0)
Monocytes Relative: 9 %
Neutro Abs: 3.7 10*3/uL (ref 1.7–7.7)
Neutrophils Relative %: 60 %
Platelets: 148 10*3/uL — ABNORMAL LOW (ref 150–400)
RBC: 2.8 MIL/uL — ABNORMAL LOW (ref 4.22–5.81)
RDW: 13.6 % (ref 11.5–15.5)
WBC: 6.2 10*3/uL (ref 4.0–10.5)
nRBC: 0 % (ref 0.0–0.2)

## 2024-03-06 LAB — URINALYSIS, W/ REFLEX TO CULTURE (INFECTION SUSPECTED)
Bacteria, UA: NONE SEEN
Bilirubin Urine: NEGATIVE
Glucose, UA: >=500 mg/dL — AB
Hgb urine dipstick: NEGATIVE
Ketones, ur: NEGATIVE mg/dL
Leukocytes,Ua: NEGATIVE
Nitrite: NEGATIVE
Protein, ur: NEGATIVE mg/dL
Specific Gravity, Urine: 1.013 (ref 1.005–1.030)
Squamous Epithelial / HPF: 0 /HPF (ref 0–5)
pH: 6 (ref 5.0–8.0)

## 2024-03-06 LAB — CORTISOL-AM, BLOOD: Cortisol - AM: 8.8 ug/dL (ref 6.7–22.6)

## 2024-03-06 LAB — RESP PANEL BY RT-PCR (RSV, FLU A&B, COVID)  RVPGX2
Influenza A by PCR: NEGATIVE
Influenza B by PCR: NEGATIVE
Resp Syncytial Virus by PCR: NEGATIVE
SARS Coronavirus 2 by RT PCR: NEGATIVE

## 2024-03-06 LAB — COMPREHENSIVE METABOLIC PANEL WITH GFR
ALT: 8 U/L (ref 0–44)
AST: 19 U/L (ref 15–41)
Albumin: 2.8 g/dL — ABNORMAL LOW (ref 3.5–5.0)
Alkaline Phosphatase: 52 U/L (ref 38–126)
Anion gap: 9 (ref 5–15)
BUN: 33 mg/dL — ABNORMAL HIGH (ref 8–23)
CO2: 24 mmol/L (ref 22–32)
Calcium: 8.6 mg/dL — ABNORMAL LOW (ref 8.9–10.3)
Chloride: 104 mmol/L (ref 98–111)
Creatinine, Ser: 3.05 mg/dL — ABNORMAL HIGH (ref 0.61–1.24)
GFR, Estimated: 22 mL/min — ABNORMAL LOW (ref >60)
Glucose, Bld: 146 mg/dL — ABNORMAL HIGH (ref 70–99)
Potassium: 3.5 mmol/L (ref 3.5–5.1)
Sodium: 137 mmol/L (ref 135–145)
Total Bilirubin: 0.9 mg/dL (ref 0.0–1.2)
Total Protein: 6.5 g/dL (ref 6.5–8.1)

## 2024-03-06 LAB — PROCALCITONIN: Procalcitonin: 0.65 ng/mL

## 2024-03-06 LAB — LIPASE, BLOOD: Lipase: 46 U/L (ref 11–51)

## 2024-03-06 LAB — LACTIC ACID, PLASMA
Lactic Acid, Venous: 2.3 mmol/L (ref 0.5–1.9)
Lactic Acid, Venous: 3.1 mmol/L (ref 0.5–1.9)

## 2024-03-06 LAB — MAGNESIUM: Magnesium: 2.5 mg/dL — ABNORMAL HIGH (ref 1.7–2.4)

## 2024-03-06 LAB — T4, FREE: Free T4: 0.8 ng/dL (ref 0.61–1.12)

## 2024-03-06 LAB — TSH: TSH: 6.428 u[IU]/mL — ABNORMAL HIGH (ref 0.350–4.500)

## 2024-03-06 LAB — PROTIME-INR
INR: 1.2 (ref 0.8–1.2)
Prothrombin Time: 15.5 s — ABNORMAL HIGH (ref 11.4–15.2)

## 2024-03-06 MED ORDER — POTASSIUM CHLORIDE CRYS ER 20 MEQ PO TBCR
40.0000 meq | EXTENDED_RELEASE_TABLET | Freq: Once | ORAL | Status: AC
  Administered 2024-03-06: 40 meq via ORAL
  Filled 2024-03-06: qty 2

## 2024-03-06 MED ORDER — LACTULOSE 10 GM/15ML PO SOLN
20.0000 g | Freq: Two times a day (BID) | ORAL | Status: DC
  Administered 2024-03-06: 20 g via ORAL
  Filled 2024-03-06 (×4): qty 30

## 2024-03-06 MED ORDER — ACETAMINOPHEN 325 MG PO TABS
650.0000 mg | ORAL_TABLET | Freq: Four times a day (QID) | ORAL | Status: DC | PRN
  Administered 2024-03-06: 650 mg via ORAL
  Filled 2024-03-06: qty 2

## 2024-03-06 MED ORDER — ROSUVASTATIN CALCIUM 20 MG PO TABS
20.0000 mg | ORAL_TABLET | Freq: Every day | ORAL | Status: DC
  Administered 2024-03-06 – 2024-03-07 (×2): 20 mg via ORAL
  Filled 2024-03-06 (×2): qty 1

## 2024-03-06 MED ORDER — FENTANYL CITRATE PF 50 MCG/ML IJ SOSY
50.0000 ug | PREFILLED_SYRINGE | Freq: Once | INTRAMUSCULAR | Status: AC
  Administered 2024-03-06: 50 ug via INTRAVENOUS
  Filled 2024-03-06: qty 1

## 2024-03-06 MED ORDER — ENOXAPARIN SODIUM 30 MG/0.3ML IJ SOSY
30.0000 mg | PREFILLED_SYRINGE | INTRAMUSCULAR | Status: DC
  Administered 2024-03-07 – 2024-03-08 (×2): 30 mg via SUBCUTANEOUS
  Filled 2024-03-06 (×2): qty 0.3

## 2024-03-06 MED ORDER — MIDODRINE HCL 5 MG PO TABS
10.0000 mg | ORAL_TABLET | Freq: Once | ORAL | Status: AC
  Administered 2024-03-06: 10 mg via ORAL
  Filled 2024-03-06: qty 2

## 2024-03-06 MED ORDER — DONEPEZIL HCL 5 MG PO TABS
10.0000 mg | ORAL_TABLET | Freq: Every day | ORAL | Status: DC
  Administered 2024-03-06 – 2024-03-07 (×2): 10 mg via ORAL
  Filled 2024-03-06 (×2): qty 2

## 2024-03-06 MED ORDER — LIDOCAINE 5 % EX PTCH
1.0000 | MEDICATED_PATCH | CUTANEOUS | Status: DC
  Administered 2024-03-06 – 2024-03-08 (×3): 1 via TRANSDERMAL
  Filled 2024-03-06 (×3): qty 1

## 2024-03-06 MED ORDER — OXYCODONE HCL 5 MG PO TABS
20.0000 mg | ORAL_TABLET | Freq: Four times a day (QID) | ORAL | Status: DC | PRN
  Administered 2024-03-06 – 2024-03-07 (×5): 20 mg via ORAL
  Filled 2024-03-06 (×6): qty 4

## 2024-03-06 MED ORDER — LACTATED RINGERS IV SOLN: 150.0000 mL/h | INTRAVENOUS | Status: AC

## 2024-03-06 MED ORDER — DIVALPROEX SODIUM ER 250 MG PO TB24
1000.0000 mg | ORAL_TABLET | Freq: Two times a day (BID) | ORAL | Status: DC
  Administered 2024-03-06 – 2024-03-08 (×5): 1000 mg via ORAL
  Filled 2024-03-06 (×5): qty 4

## 2024-03-06 MED ORDER — METRONIDAZOLE 500 MG/100ML IV SOLN
500.0000 mg | Freq: Two times a day (BID) | INTRAVENOUS | Status: DC
  Administered 2024-03-06 – 2024-03-07 (×2): 500 mg via INTRAVENOUS
  Filled 2024-03-06 (×3): qty 100

## 2024-03-06 MED ORDER — ONDANSETRON HCL 4 MG/2ML IJ SOLN
4.0000 mg | Freq: Four times a day (QID) | INTRAMUSCULAR | Status: DC | PRN
  Administered 2024-03-07: 4 mg via INTRAVENOUS
  Filled 2024-03-06: qty 2

## 2024-03-06 MED ORDER — VANCOMYCIN HCL IN DEXTROSE 1-5 GM/200ML-% IV SOLN
1000.0000 mg | Freq: Once | INTRAVENOUS | Status: AC
  Administered 2024-03-06: 1000 mg via INTRAVENOUS
  Filled 2024-03-06: qty 200

## 2024-03-06 MED ORDER — VANCOMYCIN VARIABLE DOSE PER UNSTABLE RENAL FUNCTION (PHARMACIST DOSING): Status: DC

## 2024-03-06 MED ORDER — SODIUM CHLORIDE 0.9 % IV SOLN
2.0000 g | INTRAVENOUS | Status: DC
  Administered 2024-03-07: 2 g via INTRAVENOUS
  Filled 2024-03-06: qty 12.5

## 2024-03-06 MED ORDER — ACETAMINOPHEN 650 MG RE SUPP: 650.0000 mg | Freq: Four times a day (QID) | RECTAL | Status: DC | PRN

## 2024-03-06 MED ORDER — ONDANSETRON HCL 4 MG PO TABS
4.0000 mg | ORAL_TABLET | Freq: Four times a day (QID) | ORAL | Status: DC | PRN
Start: 2024-03-06 — End: 2024-03-08

## 2024-03-06 MED ORDER — ALBUTEROL SULFATE (2.5 MG/3ML) 0.083% IN NEBU: 2.5000 mg | INHALATION_SOLUTION | Freq: Four times a day (QID) | RESPIRATORY_TRACT | Status: DC | PRN

## 2024-03-06 NOTE — ED Provider Notes (Signed)
 [facility] Provider Note    Event Date/Time   First MD Initiated Contact with Patient 03/05/24 2347     (approximate)   History   Fall and Hypotension   HPI  Albert Figueroa is a 68 y.o. 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  Not on blood thinners.  Patient is complaining of pain all over which he states is not abnormal for him but he does have new left sided shoulder pain.  No loss of consciousness.  Blood pressure was in the 70s systolic with EMS with heart rate in the 50s.  He was given 250 mL of IV fluids and route and blood pressure improved to the mid 80s.  He denies chest pain, shortness of breath, vomiting, diarrhea, bloody stools, melena, fever.  Reports recently had a prolonged hospitalization for similar symptoms.  Reports he has been taking diuretics daily.   History provided by patient, EMS.    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, spinal meningitis   Hyperlipidemia    Hypertension    Kidney disease    stage 3   Lewy body dementia (HCC)    short term memory issues only   Lumbar stenosis    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 in the 1980s"   Osteoarthrosis    Seizure (HCC)    none since 2009   Stage 4 chronic kidney disease (HCC)    Tremor of both hands     Past Surgical History:  Procedure Laterality Date   CATARACT EXTRACTION W/PHACO Right 05/14/2023   Procedure: CATARACT EXTRACTION PHACO AND INTRAOCULAR LENS PLACEMENT (IOC) RIGHT DIABETIC;  Surgeon: Annell Kidney, MD;  Location: [facility];  Service:  Ophthalmology;  Laterality: Right;  6.70 0:39.8   CATARACT EXTRACTION W/PHACO Left 05/28/2023   Procedure: CATARACT EXTRACTION PHACO AND INTRAOCULAR LENS PLACEMENT (IOC) LEFT DIABETIC 6.34 00:37.1;  Surgeon: Annell Kidney, MD;  Location: [facility];  Service: Ophthalmology;  Laterality: Left;   CSF SHUNT     placed in 1980s   KNEE SURGERY     multiple surgeries on both knees    MEDICATIONS:  Prior to Admission medications   Medication Sig Start Date End Date Taking? Authorizing Provider  acetaminophen  (TYLENOL ) 325 MG tablet Take 2 tablets (650 mg total) by mouth every 6 (six) hours as needed for mild pain (or Fever >/= 101). 09/07/22   Krishnan, Sendil K, MD  albuterol  (VENTOLIN  HFA) 108 [phone] Base) MCG/ACT inhaler Inhale into the lungs every 6 (six) hours as needed for wheezing or shortness of breath.    [provider]  dapagliflozin  propanediol (FARXIGA ) 10 MG TABS tablet Take 10 mg by mouth daily.    [provider]  dexlansoprazole (DEXILANT) 60 MG capsule Take 60 mg by mouth daily.    [provider]  divalproex  (DEPAKOTE  ER) 500 MG 24 hr tablet Take 1,000 mg by mouth in the morning and at bedtime.    [provider]  donepezil  (ARICEPT  ODT) 10 MG disintegrating tablet Take 10 mg by mouth at  bedtime.    [provider]  fenofibrate  160 MG tablet Take 160 mg by mouth daily. 04/25/22   [provider]  ferrous sulfate  325 (65 FE) MG tablet Take 325 mg by mouth daily with breakfast.    [provider]  Fluticasone -Umeclidin-Vilant (TRELEGY ELLIPTA) 100-62.5-25 MCG/ACT AEPB Inhale into the lungs daily.    [provider]  hydrOXYzine  (ATARAX ) 10 MG tablet Take 10 mg by mouth 3 (three) times daily as needed.    [provider]  lactulose  (CHRONULAC ) 10 GM/15ML solution Take 30 mLs (20 g total) by mouth 2 (two) times daily. Titrate to 1--2 bowel movements per day 01/01/24   Althia Atlas, MD  lidocaine   (LIDODERM ) 5 % Place 1 patch onto the skin daily. 01/12/24   [provider]  naloxone  (NARCAN ) nasal spray 4 mg/0.1 mL Place 1 spray into the nose once. 11/29/22   [provider]  Roflumilast  250 MCG TABS Take 1 tablet by mouth daily.    [provider]  rosuvastatin  (CRESTOR ) 20 MG tablet Take 20 mg by mouth at bedtime. 04/08/22   [provider]  tiZANidine  (ZANAFLEX ) 4 MG capsule Take 4 mg by mouth every 12 (twelve) hours.    [provider]  traZODone  (DESYREL ) 50 MG tablet Take 0.5 tablets (25 mg total) by mouth at bedtime as needed for sleep. 12/24/23   Lorita Rosa, MD  Vitamin D , Ergocalciferol , (DRISDOL ) 1.25 MG (50000 UNIT) CAPS capsule Take 1 capsule (50,000 Units total) by mouth every 7 (seven) days. 01/08/24 04/07/24  Althia Atlas, MD    Physical Exam   Triage Vital Signs: ED Triage Vitals  Encounter Vitals Group     BP 03/06/24 0004 (!) 86/52     Systolic BP Percentile --      Diastolic BP Percentile --      Pulse Rate 03/06/24 0004 (!) 50     Resp 03/06/24 0004 20     Temp 03/06/24 0004 97.8 F (36.6 C)     Temp Source 03/06/24 0004 Oral     SpO2 03/06/24 0004 97 %     Weight 03/05/24 2354 195 lb 8 oz (88.7 kg)     Height 03/06/24 0005 5\' 11"  (1.803 m)     Head Circumference --      Peak Flow --      Pain Score 03/06/24 0004 9     Pain Loc --      Pain Education --      Exclude from Growth Chart --     Most recent vital signs: Vitals:   03/06/24 0230 03/06/24 0300  BP: (!) 94/50 102/61  Pulse: 61 61  Resp: 16 (!) 28  Temp:    SpO2: 100% 100%     CONSTITUTIONAL: Alert, responds appropriately to questions.  Elderly, chronically ill-appearing HEAD: Normocephalic; atraumatic EYES: Conjunctivae clear, PERRL, EOMI, no conjunctival pallor ENT: normal nose; no rhinorrhea; moist mucous membranes; pharynx without lesions noted; no dental injury; no septal hematoma, no epistaxis; no facial deformity or bony  tenderness NECK: Supple, no midline spinal tenderness, step-off or deformity; trachea midline CARD: Regular and bradycardic; S1 and S2 appreciated; no murmurs, no clicks, no rubs, no gallops RESP: Normal chest excursion without splinting or tachypnea; breath sounds clear and equal bilaterally; no wheezes, no rhonchi, no rales; no hypoxia or respiratory distress CHEST:  chest wall stable, no crepitus or ecchymosis or deformity, nontender to palpation; no flail chest ABD/GI: Non-distended; soft, non-tender, no rebound, no  guarding; no ecchymosis or other lesions noted PELVIS:  stable, nontender to palpation BACK:  The back appears normal; no midline spinal tenderness, step-off or deformity EXT: Tender over the left shoulder without deformity.  Normal ROM in all joints; no edema; normal capillary refill; no cyanosis, otherwise no bony tenderness or bony deformity of patient's extremities, no joint effusions, compartments are soft, extremities are warm and well-perfused, no ecchymosis SKIN: Normal color for age and race; warm NEURO: No facial asymmetry, normal speech, moving all extremities equally  ED Results / Procedures / Treatments   LABS: (all labs ordered are listed, but only abnormal results are displayed) Labs Reviewed  LACTIC ACID, PLASMA - Abnormal; Notable for the following components:      Result Value   Lactic Acid, Venous 2.3 (*)    All other components within normal limits  COMPREHENSIVE METABOLIC PANEL WITH GFR - Abnormal; Notable for the following components:   Glucose, Bld 146 (*)    BUN 33 (*)    Creatinine, Ser 3.05 (*)    Calcium  8.6 (*)    Albumin 2.8 (*)    GFR, Estimated 22 (*)    All other components within normal limits  PROTIME-INR - Abnormal; Notable for the following components:   Prothrombin Time 15.5 (*)    All other components within normal limits  TSH - Abnormal; Notable for the following components:   TSH 6.428 (*)    All other components within normal  limits  MAGNESIUM  - Abnormal; Notable for the following components:   Magnesium  2.5 (*)    All other components within normal limits  CBC WITH DIFFERENTIAL/PLATELET - Abnormal; Notable for the following components:   RBC 2.80 (*)    Hemoglobin 9.4 (*)    HCT 29.6 (*)    MCV 105.7 (*)    Platelets 148 (*)    All other components within normal limits  RESP PANEL BY RT-PCR (RSV, FLU A&B, COVID)  [id number]  CULTURE, BLOOD (ROUTINE X 2)  CULTURE, BLOOD (ROUTINE X 2)  LIPASE, BLOOD  T4, FREE  LACTIC ACID, PLASMA  URINALYSIS, W/ REFLEX TO CULTURE (INFECTION SUSPECTED)     EKG:  EKG Interpretation Date/Time:  Saturday March 06 2024 00:00:08 EDT Ventricular Rate:  49 PR Interval:  126 QRS Duration:  95 QT Interval:  474 QTC Calculation: 428 R Axis:   61  Text Interpretation: Sinus bradycardia Borderline low voltage, extremity leads Confirmed by Verneda Golder [phone]) on 03/06/2024 12:18:35 AM          RADIOLOGY: My personal review and interpretation of imaging:  left acromium fracture; no other acute injury  I have personally reviewed all radiology reports. CT CHEST ABDOMEN PELVIS WO CONTRAST Result Date: 03/06/2024 CLINICAL DATA:  Recent falls and hypotension, initial encounter EXAM: CT CHEST, ABDOMEN AND PELVIS WITHOUT CONTRAST TECHNIQUE: Multidetector CT imaging of the chest, abdomen and pelvis was performed following the standard protocol without IV contrast. RADIATION DOSE REDUCTION: This exam was performed according to the departmental dose-optimization program which includes automated exposure control, adjustment of the mA and/or kV according to patient size and/or use of iterative reconstruction technique. COMPARISON:  None Available. FINDINGS: CT CHEST FINDINGS Cardiovascular: Limited due to lack of IV contrast. Atherosclerotic calcifications of the thoracic aorta are noted without aneurysmal dilatation. Generalized decreased attenuation is noted in the blood suggestive of anemia.  Heavy coronary calcifications are noted. Mediastinum/Nodes: Thoracic inlet is within normal limits. No hilar or mediastinal adenopathy is noted. The esophagus as visualized is  within normal limits. Lungs/Pleura: Lungs are well aerated bilaterally. No focal infiltrate or sizable effusion is seen. Tiny nodular densities are noted in the left upper lobe best seen on image number 53 of series 4. Patchy atelectatic changes in the right base are seen. No effusion is noted. Musculoskeletal: Degenerative changes of the thoracic spine are seen. CT ABDOMEN PELVIS FINDINGS Hepatobiliary: Gallbladder is decompressed. Mild perihepatic fluid is noted. The liver is otherwise within normal limits. Pancreas: Unremarkable. No pancreatic ductal dilatation or surrounding inflammatory changes. Spleen: Spleen is within normal limits. Mild perisplenic fluid is noted as well. Adrenals/Urinary Tract: Adrenal glands are unremarkable. Stable cysts are seen within both kidneys unchanged from the prior exam. No follow-up is recommended. No renal calculi or obstructive changes are seen. The bladder is well distended. Stomach/Bowel: The appendix is not well visualized. No obstructive or inflammatory changes of the colon are seen. Stomach is within normal limits. Few mildly dilated loops of small bowel are noted in the left mid abdomen likely representing a degree of enteritis. No true obstructive changes are seen. Vascular/Lymphatic: Aortic atherosclerosis. No enlarged abdominal or pelvic lymph nodes. Reproductive: Prostate is unremarkable. Other: Ventricular peritoneal shunt catheter is noted without complicating factors. Musculoskeletal: No acute or significant osseous findings. IMPRESSION: CT of the chest: Multiple pulmonary nodules. Most significant: Less than 6 mm left solid pulmonary nodule within the upper lobe. Per Fleischner Society Guidelines, if patient is low risk for malignancy, no routine follow-up imaging is recommended. If patient  is high risk for malignancy, a non-contrast Chest CT at 12 months is optional. If performed and the nodule is stable at 12 months, no further follow-up is recommended. These guidelines do not apply to immunocompromised patients and patients with cancer. Follow up in patients with significant comorbidities as clinically warranted. For lung cancer screening, adhere to Lung-RADS guidelines. Reference: Radiology. 2017; 284(1):228-43. No other focal abnormality is noted. CT of the abdomen and pelvis: Mildly dilated loops of jejunum with inflammatory change likely representing a degree of enteritis. No true obstructive changes are seen. Mild ascites. This may be related to the ventricular peritoneal shunt catheter. Electronically Signed   By: Violeta Grey M.D.   On: 03/06/2024 03:02   CT HEAD WO CONTRAST ( ) Result Date: 03/06/2024 CLINICAL DATA:  Two falls today. Left arm and shoulder pain. High EMS found patient hypotensive with blood pressures of 70s over 50s. Head and neck trauma. EXAM: CT HEAD WITHOUT CONTRAST CT CERVICAL SPINE WITHOUT CONTRAST TECHNIQUE: Multidetector CT imaging of the head and cervical spine was performed following the standard protocol without intravenous contrast. Multiplanar CT image reconstructions of the cervical spine were also generated. RADIATION DOSE REDUCTION: This exam was performed according to the departmental dose-optimization program which includes automated exposure control, adjustment of the mA and/or kV according to patient size and/or use of iterative reconstruction technique. COMPARISON:  CT cervical spine 07/05/2022 MRI cervical spine 05/09/2023 and CT head 01/15/2024 FINDINGS: CT HEAD FINDINGS Brain: No acute intracranial hemorrhage, mass effect, or evidence of acute infarct. Chronic low-density bilateral subdural collections. These measure 7 mm on the right and 6 mm on the left and unchanged using similar measuring technique. Stable ventricular caliber. Age related  cerebral atrophy and chronic small vessel ischemic disease. Chronic bilateral frontal infarcts. Right occipital approach VP shunt catheter with tip terminating in the right lateral ventricle at the septum pellucidum. Vascular: No hyperdense vessel. Intracranial arterial calcification. Skull: No fracture or focal lesion. Sinuses/Orbits: No acute finding. Other: None. CT CERVICAL  SPINE FINDINGS Alignment: No evidence of traumatic malalignment. Skull base and vertebrae: No acute fracture. No primary bone lesion or focal pathologic process. Soft tissues and spinal canal: No prevertebral fluid or swelling. No visible canal hematoma. Disc levels: Similar multilevel spondylosis and facet arthropathy. No severe spinal canal narrowing. Upper chest: Reported separately. Other: Carotid calcification. Partially visualized VP shunt catheter in the right neck. IMPRESSION: 1. No acute intracranial abnormality. 2. No acute fracture in the cervical spine. 3. Right occipital approach VP shunt catheter is unchanged. Stable chronic bilateral subdural collections. Stable ventricular caliber. Electronically Signed   By: Rozell Cornet M.D.   On: 03/06/2024 02:55   CT Cervical Spine Wo Contrast Result Date: 03/06/2024 CLINICAL DATA:  Two falls today. Left arm and shoulder pain. High EMS found patient hypotensive with blood pressures of 70s over 50s. Head and neck trauma. EXAM: CT HEAD WITHOUT CONTRAST CT CERVICAL SPINE WITHOUT CONTRAST TECHNIQUE: Multidetector CT imaging of the head and cervical spine was performed following the standard protocol without intravenous contrast. Multiplanar CT image reconstructions of the cervical spine were also generated. RADIATION DOSE REDUCTION: This exam was performed according to the departmental dose-optimization program which includes automated exposure control, adjustment of the mA and/or kV according to patient size and/or use of iterative reconstruction technique. COMPARISON:  CT cervical spine  07/05/2022 MRI cervical spine 05/09/2023 and CT head 01/15/2024 FINDINGS: CT HEAD FINDINGS Brain: No acute intracranial hemorrhage, mass effect, or evidence of acute infarct. Chronic low-density bilateral subdural collections. These measure 7 mm on the right and 6 mm on the left and unchanged using similar measuring technique. Stable ventricular caliber. Age related cerebral atrophy and chronic small vessel ischemic disease. Chronic bilateral frontal infarcts. Right occipital approach VP shunt catheter with tip terminating in the right lateral ventricle at the septum pellucidum. Vascular: No hyperdense vessel. Intracranial arterial calcification. Skull: No fracture or focal lesion. Sinuses/Orbits: No acute finding. Other: None. CT CERVICAL SPINE FINDINGS Alignment: No evidence of traumatic malalignment. Skull base and vertebrae: No acute fracture. No primary bone lesion or focal pathologic process. Soft tissues and spinal canal: No prevertebral fluid or swelling. No visible canal hematoma. Disc levels: Similar multilevel spondylosis and facet arthropathy. No severe spinal canal narrowing. Upper chest: Reported separately. Other: Carotid calcification. Partially visualized VP shunt catheter in the right neck. IMPRESSION: 1. No acute intracranial abnormality. 2. No acute fracture in the cervical spine. 3. Right occipital approach VP shunt catheter is unchanged. Stable chronic bilateral subdural collections. Stable ventricular caliber. Electronically Signed   By: Rozell Cornet M.D.   On: 03/06/2024 02:55   DG Chest Port 1 View Result Date: 03/06/2024 CLINICAL DATA:  History of recent falls, possible sepsis EXAM: PORTABLE CHEST 1 VIEW COMPARISON:  01/15/2024 FINDINGS: Cardiac shadow is stable. Lungs are hypoinflated but clear. Shunt catheter is again noted on the right. No acute bony abnormality is noted. IMPRESSION: No acute abnormality noted. Electronically Signed   By: Violeta Grey M.D.   On: 03/06/2024 00:17    DG Shoulder 1 View Left Result Date: 03/06/2024 CLINICAL DATA:  Recent fall with left shoulder pain, initial encounter EXAM: LEFT SHOULDER COMPARISON:  None Available. FINDINGS: Distal clavicle appears within normal limits. There is a step-off in the acromion suspicious for fracture underlying bony thorax appears within normal limits. IMPRESSION: Findings suspicious for acromial fracture Electronically Signed   By: Violeta Grey M.D.   On: 03/06/2024 00:17     PROCEDURES:  Critical Care performed: Yes, see critical care  procedure note(s)   CRITICAL CARE Performed by: Verneda Golder   Total critical care time: 35 minutes  Critical care time was exclusive of separately billable procedures and treating other patients.  Critical care was necessary to treat or prevent imminent or life-threatening deterioration.  Critical care was time spent personally by me on the following activities: development of treatment plan with patient and/or surrogate as well as nursing, discussions with consultants, evaluation of patient's response to treatment, examination of patient, obtaining history from patient or surrogate, ordering and performing treatments and interventions, ordering and review of laboratory studies, ordering and review of radiographic studies, pulse oximetry and re-evaluation of patient's condition.   Aaron Aas1-3 Lead EKG Interpretation  Performed by: Jennyfer Nickolson, Clover Dao, DO Authorized by: Raymond Bhardwaj, Clover Dao, DO     Interpretation: abnormal     ECG rate:  50   ECG rate assessment: bradycardic     Rhythm: sinus bradycardia     Ectopy: none     Conduction: normal       IMPRESSION / MDM / ASSESSMENT AND PLAN / ED COURSE  I reviewed the triage vital signs and the nursing notes.  Patient here for hypotension, fall.  The patient is on the cardiac monitor to evaluate for evidence of arrhythmia and/or significant heart rate changes.   DIFFERENTIAL DIAGNOSIS (includes but not limited to):    Dehydration, sepsis, anemia, overdiuresis, kidney failure, concussion, skull fracture, cervical spine fracture, shoulder fracture, intracranial hemorrhage, less likely shoulder dislocation  Patient's presentation is most consistent with acute presentation with potential threat to life or bodily function.  PLAN: Will obtain CT imaging of the head, neck, x-ray of the left shoulder, labs, urine, cultures.  Will give IV antibiotics, IV fluids.  Anticipate admission.   MEDICATIONS GIVEN IN ED: Medications  lactated ringers  infusion ( Intravenous New Bag/Given 03/06/24 0308)  lactated ringers  bolus 1,000 mL (0 mLs Intravenous Stopped 03/06/24 0112)    And  lactated ringers  bolus 1,000 mL (0 mLs Intravenous Stopped 03/06/24 0113)    And  lactated ringers  bolus 1,000 mL (0 mLs Intravenous Stopped 03/06/24 0248)  ceFEPIme  (MAXIPIME ) 2 g in sodium chloride  0.9 % 100 mL IVPB (0 g Intravenous Stopped 03/06/24 0122)  metroNIDAZOLE  (FLAGYL ) IVPB 500 mg (0 mg Intravenous Stopped 03/06/24 0158)  vancomycin  (VANCOCIN ) IVPB 1000 mg/200 mL premix (0 mg Intravenous Stopped 03/06/24 0316)  fentaNYL  (SUBLIMAZE ) injection 50 mcg (50 mcg Intravenous Given 03/06/24 0013)  ondansetron  (ZOFRAN ) injection 4 mg (4 mg Intravenous Given 03/06/24 0050)  fentaNYL  (SUBLIMAZE ) injection 50 mcg (50 mcg Intravenous Given 03/06/24 0314)     ED COURSE: Patient's blood pressure improving with IV hydration.  Labs do show a new acute kidney injury superimposed on chronic kidney disease.  Normal potassium.  Lactic elevated at 2.3.  He is getting antibiotics for possible sepsis but no source of infection currently.  He is afebrile.  No leukocytosis.  Mild anemia which is stable.   X-rays, CT scans reviewed and interpreted by myself and the radiologist.  Patient may have a new left acromial fracture.  Will place him in a sling.  No other injury seen on imaging.  He does have multiple pulmonary nodules and some small bowel inflammation which could be  seen with enteritis.  He denies any vomiting or diarrhea.  Blood pressures have been fluid responsive.   Patient has been very aggressive about requesting pain medication.  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  Will continue to give fentanyl  as needed as this would not drop his blood pressure but we did discuss concerns that the last time he was here he was altered likely from opiate overdose, polypharmacy.   Will discuss with hospitalist for admission for hypotension, acute kidney injury.  CONSULTS:  Consulted and discussed patient's case with hospitalist, Dr. Vallarie Gauze.  I have recommended admission and consulting physician agrees and will place admission orders.  Patient (and family if present) agree with this plan.   I reviewed all nursing notes, vitals, pertinent previous records.  All labs, EKGs, imaging ordered have been independently reviewed and interpreted by myself.    OUTSIDE RECORDS REVIEWED: Reviewed last admission for acute kidney injury, hypotension in April 2025.       FINAL CLINICAL IMPRESSION(S) / ED DIAGNOSES   Final diagnoses:  Hypotension, unspecified hypotension type  Fall, initial encounter  Injury of head, initial encounter  Acute kidney injury superimposed on chronic kidney disease (HCC)  Closed nondisplaced fracture of acromial process of left scapula, initial encounter     Rx / DC Orders   ED Discharge Orders     None        Note:  This document was prepared using Dragon voice recognition software and may include unintentional dictation errors.   Marvene Strohm, Clover Dao, DO 03/06/24 [phone]

## 2024-03-06 NOTE — Assessment & Plan Note (Signed)
 Delirium precautions Continue donepezil  and divalproex  Hold bedtime trazodone  if able

## 2024-03-06 NOTE — Assessment & Plan Note (Signed)
 Patient bradycardic to the high 40s.  No tachycardic response seen in response to hypotension Continuous cardiac monitoring Hold and avoid any AV nodal blockers

## 2024-03-06 NOTE — ED Notes (Signed)
 Critical lactate 2.3, Dr. Author Board notified

## 2024-03-06 NOTE — Progress Notes (Signed)
 Anticoagulation monitoring(Lovenox ):  68 yo male ordered Lovenox  40 mg Q24h    Filed Weights   03/05/24 2354 03/06/24 0005  Weight: 88.7 kg (195 lb 8 oz) 88.5 kg (195 lb 1.6 oz)   BMI 27.2    Lab Results  Component Value Date   CREATININE 3.05 (H) 03/05/2024   CREATININE 1.86 (H) 01/19/2024   CREATININE 2.04 (H) 01/18/2024   Estimated Creatinine Clearance: 25 mL/min (A) (by C-G formula based on SCr of 3.05 mg/dL (H)). Hemoglobin & Hematocrit     Component Value Date/Time   HGB 9.4 (L) 03/06/2024 0018   HGB 12.7 (L) 10/13/2023 1442   HGB 15.5 10/06/2012 1710   HCT 29.6 (L) 03/06/2024 0018   HCT 46.7 10/06/2012 1710     Per Protocol for Patient with estCrcl < 30 ml/min and BMI < 30, will transition to Lovenox  30 mg Q24h.

## 2024-03-06 NOTE — Assessment & Plan Note (Addendum)
 Polypharmacy History of opiate overdose 12/2023 requiring IV Narcan  Patient complained of intractable pain in the ED which was treated with fentanyl ( due to low BP) Judicious pain management with multimodal pain control, lidocaine  patch,. Will opt for fentanyl  due to BP with parameters,. Can't take NSAIDs due to CKD 4 Hold sedating meds as able Close monitoring for respiratory depression in view of history

## 2024-03-06 NOTE — Assessment & Plan Note (Addendum)
 Lactic acidosis Possible severe sepsis, lower suspicion Suspect lactic acidosis related to tissue hypoxia from hypotension of multifactorial etiology Sepsis criteria will include hypotension and AKI and lactic acidosis CT imaging and workup unrevealing for source of infection Received broad-spectrum antibiotics --will continue until ruled out Will get procalcitonin to assist with possible sepsis diagnosis Follow UA Continue IV fluid resuscitation Will give an empiric dose of midodrine.  Frequent reassessment Hold all home antihypertensives

## 2024-03-06 NOTE — ED Notes (Signed)
 Patient stated he would like to leave AMA. RN educated patient to stay per MD recommendation. Patient requested to call daughter. RN provided patient with phone to call daughter. MD sent a message to update.

## 2024-03-06 NOTE — ED Triage Notes (Signed)
 Pt arrived by EMS after having 2 falls today.  Pt is c/o left arm/shoulder pain.  EMS found pt to be hypotensive in route with blood pressures 70s/50s initially, all other VSS.

## 2024-03-06 NOTE — ED Notes (Signed)
 Patient communicated that he wanted to go home since he had to wait on a room upstairs. This RN explained to patient that the RN's and staff down here in the ED did not have anything to do with bed placement and the rate of how fast the bed was assigned. This RN also explained that the patient would get the same care here in the ED as they would up on the floor. The patient stated he was cold. This RN placed warm blankets on patient.

## 2024-03-06 NOTE — Progress Notes (Signed)
 Pt being followed by [facility] for Sepsis protocol.

## 2024-03-06 NOTE — Assessment & Plan Note (Signed)
Continue Depakote. 

## 2024-03-06 NOTE — Assessment & Plan Note (Addendum)
 Fall at home Trauma workup with pan CT of the why is negative Patient was placed in a sling in the ED Consider PT eval Outpatient orthopedic referral.  Can consider inpatient consult

## 2024-03-06 NOTE — Progress Notes (Signed)
 Pharmacy Antibiotic Note  Albert Figueroa is a 68 y.o. male admitted on 03/05/2024 with infection of unknown source.  Pharmacy has been consulted for Vanc, Cefepime  dosing.  Pt is AKI on CKD , will dose vanc by levels.   Plan: Cefepime  2 gm IV X 1 given in ED on 6/7 @ 0052. Cefepime  2 gm IV Q24H ordered to start on 6/8 @ 0100.  Vancomycin  1 gm IV X 1 given in ED on 6/7 @ 0204.  Additional Vanc 1 gm IV X 1 ordered to make total loading dose of 2 gm.  - Pt is AKI on CKD.  Will dose by levels til renal function returns to baseline. - Will order Vanc level on 6/8 @ 0200. - Goal trough: 15 - 20 mcg/mL   Height: 5\' 11"  (180.3 cm) Weight: 88.5 kg (195 lb 1.6 oz) IBW/kg (Calculated) : 75.3  Temp (24hrs), Avg:97.8 F (36.6 C), Min:97.8 F (36.6 C), Max:97.8 F (36.6 C)  Recent Labs  Lab 03/05/24 2354 03/06/24 0018 03/06/24 0333  WBC  --  6.2  --   CREATININE 3.05*  --   --   LATICACIDVEN 2.3*  --  3.1*    Estimated Creatinine Clearance: 25 mL/min (A) (by C-G formula based on SCr of 3.05 mg/dL (H)).    Allergies  Allergen Reactions   Gabapentin  Other (See Comments)    Lethargic, Confused   Amlodipine     Other reaction(s): Other (see comments) Caused elevated blood pressure and headaches   Ozempic (0.25 Or 0.5 Mg-Dose) [Semaglutide(0.25 Or 0.5mg -Dos)]     Lethargy.  Also dropped blood sugars too low    Antimicrobials this admission:   >>    >>   Dose adjustments this admission:   Microbiology results:  BCx:   UCx:    Sputum:    MRSA PCR:   Thank you for allowing pharmacy to be a part of this patient's care.  Albert Figueroa D 03/06/2024 4:57 AM

## 2024-03-06 NOTE — Progress Notes (Signed)
 CODE SEPSIS - PHARMACY COMMUNICATION  **Broad Spectrum Antibiotics should be administered within 1 hour of Sepsis diagnosis**  Time Code Sepsis Called/Page Received:  6/6 @ 2355   Antibiotics Ordered:  Cefepime  , Vancomycin    Time of 1st antibiotic administration: Cefepime  2 gm IV X 1 on 6/7 @ 0052  Additional action taken by pharmacy:   If necessary, Name of Provider/Nurse Contacted:     Jalayah Gutridge D ,PharmD Clinical Pharmacist  03/06/2024  1:00 AM

## 2024-03-06 NOTE — H&P (Signed)
 History and Physical    Patient: Albert Figueroa UJW:[id number] DOB: [birth year] DOA: 03/05/2024 DOS: the patient was seen and examined on 03/06/2024 PCP: Monique Ano, MD  Patient coming from: Home  Chief Complaint:  Chief Complaint  Patient presents with   Fall   Hypotension    HPI: Albert Figueroa is a 68 y.o. 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  He did not lose consciousness.  He complains of left shoulder pain.  BP was in the 70s with EMS and he was given a 250 mL IV fluid bolus with SBP improving to the 80s.  He denies any recent GI symptoms. Patient is awake and alert.  States at baseline his pain is about a 7-8.  Now 10/10.  He is having worsening of his chronic neck and back pain, and his left shoulder is also 10 out of 10  ED course and data review: BP 79/53 on arrival improving to 91/51 by the time of admission. Of  Labs notable for creatinine of 3 up from the 1.86. WBC normal at 6.2 but with lactic acid of 2.3, hemoglobin at baseline at 9.4 Respiratory viral panel negative  EKG, personally viewed and interpreted showing sinus bradycardia at 49  Trauma pan scan which included CT head, C-spine, CT chest abdomen pelvis with contrast for the most part unremarkable Left shoulder x-ray showed a possible acromial fracture  Patient reports she had a fluid bolus and was started on broad-spectrum antibiotics for initial suspicion of sepsis  He was treated with 2 doses of fentanyl  for pain.  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, spinal meningitis   Hyperlipidemia    Hypertension    Kidney disease    stage 3   Lewy body dementia (HCC)    short term memory issues only   Lumbar stenosis    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 in the 1980s"   Osteoarthrosis    Seizure (HCC)    none since 2009   Stage 4 chronic kidney disease (HCC)    Tremor of both hands    Past Surgical History:  Procedure Laterality Date   CATARACT EXTRACTION W/PHACO Right 05/14/2023   Procedure: CATARACT EXTRACTION PHACO AND INTRAOCULAR LENS PLACEMENT (IOC) RIGHT DIABETIC;  Surgeon: Annell Kidney, MD;  Location: [facility];  Service: Ophthalmology;  Laterality: Right;  6.70 0:39.8   CATARACT EXTRACTION W/PHACO Left 05/28/2023   Procedure: CATARACT EXTRACTION PHACO AND INTRAOCULAR LENS PLACEMENT (IOC) LEFT DIABETIC 6.34 00:37.1;  Surgeon: Annell Kidney, MD;  Location: [facility];  Service: Ophthalmology;  Laterality: Left;   CSF SHUNT     placed in 1980s   KNEE SURGERY     multiple surgeries on both knees   Social History:  reports that he has been smoking cigarettes. He has a 96 pack-year smoking history. He has never used smokeless tobacco. He reports that he does not drink alcohol and does not use drugs.  Allergies  Allergen Reactions   Gabapentin  Other (See Comments)    Lethargic, Confused   Amlodipine     Other reaction(s): Other (see comments) Caused elevated blood pressure and headaches   Ozempic (0.25 Or 0.5 Mg-Dose) [Semaglutide(0.25 Or 0.5mg -Dos)]     Lethargy.  Also dropped blood sugars too low    Family History  Problem Relation Age of Onset   Diabetes Mother    Cancer Mother    CAD Father     Prior to Admission medications   Medication Sig Start Date End Date  Taking? Authorizing Provider  acetaminophen  (TYLENOL ) 325 MG tablet Take 2 tablets (650 mg total) by mouth every 6 (six) hours as needed for mild pain (or Fever >/= 101). 09/07/22   Krishnan, Sendil K, MD  albuterol  (VENTOLIN  HFA) 108 [phone] Base) MCG/ACT inhaler Inhale into the lungs every 6 (six) hours as needed for wheezing or shortness of breath.    [provider]  dapagliflozin  propanediol (FARXIGA ) 10 MG TABS tablet Take 10 mg by mouth daily.    [provider]  dexlansoprazole (DEXILANT) 60 MG capsule Take 60 mg by mouth daily.    [provider]  divalproex  (DEPAKOTE  ER) 500 MG 24 hr tablet Take 1,000 mg by mouth in the morning and at bedtime.    [provider]  donepezil  (ARICEPT  ODT) 10 MG disintegrating tablet Take 10 mg by mouth at bedtime.    [provider]  fenofibrate  160 MG tablet Take 160 mg by mouth daily. 04/25/22   [provider]  ferrous sulfate  325 (65 FE) MG tablet Take 325 mg by mouth daily with breakfast.    [provider]  Fluticasone -Umeclidin-Vilant (TRELEGY ELLIPTA) 100-62.5-25 MCG/ACT AEPB Inhale into the lungs daily.    [provider]  hydrOXYzine  (ATARAX ) 10 MG tablet Take 10 mg by mouth 3 (three) times daily as needed.    [provider]  lactulose  (CHRONULAC ) 10 GM/15ML solution Take 30 mLs (20 g total) by mouth 2 (two) times daily. Titrate to 1--2 bowel movements per day 01/01/24   Althia Atlas, MD  lidocaine  (LIDODERM ) 5 % Place 1 patch onto the skin daily. 01/12/24   [provider]  naloxone  (NARCAN ) nasal spray 4 mg/0.1 mL Place 1 spray into the nose once. 11/29/22   [provider]  Roflumilast  250 MCG TABS Take 1 tablet by mouth daily.    [provider]  rosuvastatin  (CRESTOR ) 20 MG tablet Take 20 mg by mouth at bedtime. 04/08/22   [provider]  tiZANidine  (ZANAFLEX ) 4 MG capsule Take 4 mg by mouth every 12 (twelve) hours.    [provider]  traZODone  (DESYREL ) 50 MG tablet Take 0.5 tablets (25 mg total) by mouth at bedtime as needed for sleep. 12/24/23   Lorita Rosa, MD  Vitamin D , Ergocalciferol , (DRISDOL ) 1.25 MG (50000 UNIT) CAPS capsule Take 1 capsule (50,000 Units total) by mouth every 7 (seven) days. 01/08/24 04/07/24  Althia Atlas, MD    Physical Exam: Vitals:   03/06/24 0200 03/06/24 0230 03/06/24 0300 03/06/24 0330  BP: (!) 89/56 (!) 94/50 102/61 (!) 91/59  Pulse: 60 61 61 63  Resp: 16 16 (!) 28 16  Temp:      TempSrc:      SpO2: 100%  100% 100% 98%  Weight:      Height:       Physical Exam Vitals and nursing note reviewed.  Constitutional:      General: He is not in acute distress. HENT:     Head: Normocephalic and atraumatic.  Cardiovascular:     Rate and Rhythm: Normal rate and regular rhythm.     Heart sounds: Normal heart sounds.  Pulmonary:     Effort: Pulmonary effort is normal.     Breath sounds: Normal breath sounds.  Abdominal:     Palpations: Abdomen is soft.     Tenderness: There is no abdominal tenderness.  Musculoskeletal:     Comments: Sling left arm  Neurological:     Mental Status: Mental status is at baseline.     Labs on Admission: I have personally reviewed following labs and imaging studies  CBC: Recent Labs  Lab 03/06/24 0018  WBC 6.2  NEUTROABS 3.7  HGB 9.4*  HCT 29.6*  MCV 105.7*  PLT 148*   Basic Metabolic Panel: Recent Labs  Lab 03/05/24 2354 03/06/24 0019  NA 137  --   K 3.5  --   CL 104  --   CO2 24  --   GLUCOSE 146*  --   BUN 33*  --   CREATININE 3.05*  --   CALCIUM  8.6*  --   MG  --  2.5*   GFR: Estimated Creatinine Clearance: 25 mL/min (A) (by C-G formula based on SCr of 3.05 mg/dL (H)). Liver Function Tests: Recent Labs  Lab 03/05/24 2354  AST 19  ALT 8  ALKPHOS 52  BILITOT 0.9  PROT 6.5  ALBUMIN 2.8*   Recent Labs  Lab 03/05/24 2354  LIPASE 46   No results for input(s): "AMMONIA" in the last 168  hours. Coagulation Profile: Recent Labs  Lab 03/05/24 2354  INR 1.2   Cardiac Enzymes: No results for input(s): "CKTOTAL", "CKMB", "CKMBINDEX", "TROPONINI" in the last 168 hours. BNP (last 3 results) No results for input(s): "PROBNP" in the last 8760 hours. HbA1C: No results for input(s): "HGBA1C" in the last 72 hours. CBG: No results for input(s): "GLUCAP" in the last 168 hours. Lipid Profile: No results for input(s): "CHOL", "HDL", "LDLCALC", "TRIG", "CHOLHDL", "LDLDIRECT" in the last 72 hours. Thyroid Function Tests: Recent Labs    03/05/24 2354 03/06/24 0015  TSH 6.428*  --   FREET4  --  0.80   Anemia Panel: No results for input(s): "VITAMINB12", "FOLATE", "FERRITIN", "TIBC", "IRON", "RETICCTPCT" in the last 72 hours. Urine analysis:    Component Value Date/Time   COLORURINE YELLOW (A) 01/15/2024 1719   APPEARANCEUR CLEAR (A) 01/15/2024 1719   LABSPEC 1.014 01/15/2024 1719   PHURINE 5.0 01/15/2024 1719   GLUCOSEU 50 (A) 01/15/2024 1719   HGBUR LARGE (A) 01/15/2024 1719   BILIRUBINUR NEGATIVE 01/15/2024 1719   KETONESUR NEGATIVE 01/15/2024 1719   PROTEINUR 30 (A) 01/15/2024 1719   NITRITE NEGATIVE 01/15/2024 1719   LEUKOCYTESUR NEGATIVE 01/15/2024 1719    Radiological Exams on Admission: CT CHEST ABDOMEN PELVIS WO CONTRAST Result Date: 03/06/2024 CLINICAL DATA:  Recent falls and hypotension, initial encounter EXAM: CT CHEST, ABDOMEN AND PELVIS WITHOUT CONTRAST TECHNIQUE: Multidetector CT imaging of the chest, abdomen and pelvis was performed following the standard protocol without IV contrast. RADIATION DOSE REDUCTION: This exam was performed according to the departmental dose-optimization program which includes automated exposure control, adjustment of the mA and/or kV according to patient size and/or use of iterative reconstruction  technique. COMPARISON:  None Available. FINDINGS: CT CHEST FINDINGS Cardiovascular: Limited due to lack of IV contrast. Atherosclerotic  calcifications of the thoracic aorta are noted without aneurysmal dilatation. Generalized decreased attenuation is noted in the blood suggestive of anemia. Heavy coronary calcifications are noted. Mediastinum/Nodes: Thoracic inlet is within normal limits. No hilar or mediastinal adenopathy is noted. The esophagus as visualized is within normal limits. Lungs/Pleura: Lungs are well aerated bilaterally. No focal infiltrate or sizable effusion is seen. Tiny nodular densities are noted in the left upper lobe best seen on image number 53 of series 4. Patchy atelectatic changes in the right base are seen. No effusion is noted. Musculoskeletal: Degenerative changes of the thoracic spine are seen. CT ABDOMEN PELVIS FINDINGS Hepatobiliary: Gallbladder is decompressed. Mild perihepatic fluid is noted. The liver is otherwise within normal limits. Pancreas: Unremarkable. No pancreatic ductal dilatation or surrounding inflammatory changes. Spleen: Spleen is within normal limits. Mild perisplenic fluid is noted as well. Adrenals/Urinary Tract: Adrenal glands are unremarkable. Stable cysts are seen within both kidneys unchanged from the prior exam. No follow-up is recommended. No renal calculi or obstructive changes are seen. The bladder is well distended. Stomach/Bowel: The appendix is not well visualized. No obstructive or inflammatory changes of the colon are seen. Stomach is within normal limits. Few mildly dilated loops of small bowel are noted in the left mid abdomen likely representing a degree of enteritis. No true obstructive changes are seen. Vascular/Lymphatic: Aortic atherosclerosis. No enlarged abdominal or pelvic lymph nodes. Reproductive: Prostate is unremarkable. Other: Ventricular peritoneal shunt catheter is noted without complicating factors. Musculoskeletal: No acute or significant osseous findings. IMPRESSION: CT of the chest: Multiple pulmonary nodules. Most significant: Less than 6 mm left solid pulmonary  nodule within the upper lobe. Per Fleischner Society Guidelines, if patient is low risk for malignancy, no routine follow-up imaging is recommended. If patient is high risk for malignancy, a non-contrast Chest CT at 12 months is optional. If performed and the nodule is stable at 12 months, no further follow-up is recommended. These guidelines do not apply to immunocompromised patients and patients with cancer. Follow up in patients with significant comorbidities as clinically warranted. For lung cancer screening, adhere to Lung-RADS guidelines. Reference: Radiology. 2017; 284(1):228-43. No other focal abnormality is noted. CT of the abdomen and pelvis: Mildly dilated loops of jejunum with inflammatory change likely representing a degree of enteritis. No true obstructive changes are seen. Mild ascites. This may be related to the ventricular peritoneal shunt catheter. Electronically Signed   By: Violeta Grey M.D.   On: 03/06/2024 03:02   CT HEAD WO CONTRAST ( ) Result Date: 03/06/2024 CLINICAL DATA:  Two falls today. Left arm and shoulder pain. High EMS found patient hypotensive with blood pressures of 70s over 50s. Head and neck trauma. EXAM: CT HEAD WITHOUT CONTRAST CT CERVICAL SPINE WITHOUT CONTRAST TECHNIQUE: Multidetector CT imaging of the head and cervical spine was performed following the standard protocol without intravenous contrast. Multiplanar CT image reconstructions of the cervical spine were also generated. RADIATION DOSE REDUCTION: This exam was performed according to the departmental dose-optimization program which includes automated exposure control, adjustment of the mA and/or kV according to patient size and/or use of iterative reconstruction technique. COMPARISON:  CT cervical spine 07/05/2022 MRI cervical spine 05/09/2023 and CT head 01/15/2024 FINDINGS: CT HEAD FINDINGS Brain: No acute intracranial hemorrhage, mass effect, or evidence of acute infarct. Chronic low-density bilateral subdural  collections. These measure 7 mm on the right and 6 mm on  the left and unchanged using similar measuring technique. Stable ventricular caliber. Age related cerebral atrophy and chronic small vessel ischemic disease. Chronic bilateral frontal infarcts. Right occipital approach VP shunt catheter with tip terminating in the right lateral ventricle at the septum pellucidum. Vascular: No hyperdense vessel. Intracranial arterial calcification. Skull: No fracture or focal lesion. Sinuses/Orbits: No acute finding. Other: None. CT CERVICAL SPINE FINDINGS Alignment: No evidence of traumatic malalignment. Skull base and vertebrae: No acute fracture. No primary bone lesion or focal pathologic process. Soft tissues and spinal canal: No prevertebral fluid or swelling. No visible canal hematoma. Disc levels: Similar multilevel spondylosis and facet arthropathy. No severe spinal canal narrowing. Upper chest: Reported separately. Other: Carotid calcification. Partially visualized VP shunt catheter in the right neck. IMPRESSION: 1. No acute intracranial abnormality. 2. No acute fracture in the cervical spine. 3. Right occipital approach VP shunt catheter is unchanged. Stable chronic bilateral subdural collections. Stable ventricular caliber. Electronically Signed   By: Rozell Cornet M.D.   On: 03/06/2024 02:55   CT Cervical Spine Wo Contrast Result Date: 03/06/2024 CLINICAL DATA:  Two falls today. Left arm and shoulder pain. High EMS found patient hypotensive with blood pressures of 70s over 50s. Head and neck trauma. EXAM: CT HEAD WITHOUT CONTRAST CT CERVICAL SPINE WITHOUT CONTRAST TECHNIQUE: Multidetector CT imaging of the head and cervical spine was performed following the standard protocol without intravenous contrast. Multiplanar CT image reconstructions of the cervical spine were also generated. RADIATION DOSE REDUCTION: This exam was performed according to the departmental dose-optimization program which includes automated  exposure control, adjustment of the mA and/or kV according to patient size and/or use of iterative reconstruction technique. COMPARISON:  CT cervical spine 07/05/2022 MRI cervical spine 05/09/2023 and CT head 01/15/2024 FINDINGS: CT HEAD FINDINGS Brain: No acute intracranial hemorrhage, mass effect, or evidence of acute infarct. Chronic low-density bilateral subdural collections. These measure 7 mm on the right and 6 mm on the left and unchanged using similar measuring technique. Stable ventricular caliber. Age related cerebral atrophy and chronic small vessel ischemic disease. Chronic bilateral frontal infarcts. Right occipital approach VP shunt catheter with tip terminating in the right lateral ventricle at the septum pellucidum. Vascular: No hyperdense vessel. Intracranial arterial calcification. Skull: No fracture or focal lesion. Sinuses/Orbits: No acute finding. Other: None. CT CERVICAL SPINE FINDINGS Alignment: No evidence of traumatic malalignment. Skull base and vertebrae: No acute fracture. No primary bone lesion or focal pathologic process. Soft tissues and spinal canal: No prevertebral fluid or swelling. No visible canal hematoma. Disc levels: Similar multilevel spondylosis and facet arthropathy. No severe spinal canal narrowing. Upper chest: Reported separately. Other: Carotid calcification. Partially visualized VP shunt catheter in the right neck. IMPRESSION: 1. No acute intracranial abnormality. 2. No acute fracture in the cervical spine. 3. Right occipital approach VP shunt catheter is unchanged. Stable chronic bilateral subdural collections. Stable ventricular caliber. Electronically Signed   By: Rozell Cornet M.D.   On: 03/06/2024 02:55   DG Chest Port 1 View Result Date: 03/06/2024 CLINICAL DATA:  History of recent falls, possible sepsis EXAM: PORTABLE CHEST 1 VIEW COMPARISON:  01/15/2024 FINDINGS: Cardiac shadow is stable. Lungs are hypoinflated but clear. Shunt catheter is again noted on the  right. No acute bony abnormality is noted. IMPRESSION: No acute abnormality noted. Electronically Signed   By: Violeta Grey M.D.   On: 03/06/2024 00:17   DG Shoulder 1 View Left Result Date: 03/06/2024 CLINICAL DATA:  Recent fall with left shoulder pain, initial encounter  EXAM: LEFT SHOULDER COMPARISON:  None Available. FINDINGS: Distal clavicle appears within normal limits. There is a step-off in the acromion suspicious for fracture underlying bony thorax appears within normal limits. IMPRESSION: Findings suspicious for acromial fracture Electronically Signed   By: Violeta Grey M.D.   On: 03/06/2024 00:17   Data Reviewed for HPI: Relevant notes from primary care and specialist visits, past discharge summaries as available in EHR, including Care Everywhere. Prior diagnostic testing as pertinent to current admission diagnoses Updated medications and problem lists for reconciliation ED course, including vitals, labs, imaging, treatment and response to treatment Triage notes, nursing and pharmacy notes and ED provider's notes Notable results as noted above in HPI      Assessment and Plan: Hypotension Lactic acidosis Possible severe sepsis, lower suspicion Suspect lactic acidosis related to tissue hypoxia from hypotension of multifactorial etiology Sepsis criteria will include hypotension and AKI and lactic acidosis CT imaging and workup unrevealing for source of infection Received broad-spectrum antibiotics --will continue until ruled out Will get procalcitonin to assist with possible sepsis diagnosis Follow UA Continue IV fluid resuscitation Will give an empiric dose of midodrine.  Frequent reassessment Hold all home antihypertensives  Sinus bradycardia Patient bradycardic to the high 40s.  No tachycardic response seen in response to hypotension Continuous cardiac monitoring Hold and avoid any AV nodal blockers  Acute kidney injury superimposed on stage IIIb chronic kidney disease  (HCC) Creatinine 3 up from baseline of 1.86 Likely ATN related to hypotension with renal hypoperfusion Continue IV fluid resuscitation Monitor renal function and avoid nephrotoxins  Chronic pain Polypharmacy History of opiate overdose 12/2023 requiring IV Narcan  Patient complained of intractable pain in the ED which was treated with fentanyl ( due to low BP) Judicious pain management with multimodal pain control, lidocaine  patch,. Will opt for fentanyl  due to BP with parameters,. Can't take NSAIDs due to CKD 4 Hold sedating meds as able Close monitoring for respiratory depression in view of history  Nondisplaced fracture of left acromial process Fall at home Trauma workup with pan CT of the why is negative Patient was placed in a sling in the ED Consider PT eval Outpatient orthopedic referral.  Can consider inpatient consult  Type 2 diabetes mellitus with renal complication (HCC) Sliding scale insulin  coverage  COPD (chronic obstructive pulmonary disease) (HCC) Not acutely exacerbated Continue home inhalers DuoNebs as needed  History of seizure Continue Depakote   Moderate Lewy body dementia without behavioral disturbance, psychotic disturbance, mood disturbance, or anxiety (HCC) Delirium precautions Continue donepezil  and divalproex  Hold bedtime trazodone  if able     DVT prophylaxis: Lovenox   Consults: none  Advance Care Planning:   Code Status: Prior   Family Communication: none  Disposition Plan: Back to previous home environment  Severity of Illness: The appropriate patient status for this patient is OBSERVATION. Observation status is judged to be reasonable and necessary in order to provide the required intensity of service to ensure the patient's safety. The patient's presenting symptoms, physical exam findings, and initial radiographic and laboratory data in the context of their medical condition is felt to place them at decreased risk for further clinical  deterioration. Furthermore, it is anticipated that the patient will be medically stable for discharge from the hospital within 2 midnights of admission.   CRITICAL CARE Performed by: Lanetta Pion   Total critical care time: 70 minutes  Critical care time was exclusive of separately billable procedures and treating other patients.  Critical care was necessary to treat or prevent  imminent or life-threatening deterioration.  Critical care was time spent personally by me on the following activities: development of treatment plan with patient and/or surrogate as well as nursing, discussions with consultants, evaluation of patient's response to treatment, examination of patient, obtaining history from patient or surrogate, ordering and performing treatments and interventions, ordering and review of laboratory studies, ordering and review of radiographic studies, pulse oximetry and re-evaluation of patient's condition.   Author: Lanetta Pion, MD 03/06/2024 4:22 AM  For on call review [email or web address].

## 2024-03-06 NOTE — ED Notes (Signed)
 Patient informed of room that is available. Patient states he is glad the room is available.

## 2024-03-06 NOTE — Assessment & Plan Note (Signed)
 Not acutely exacerbated Continue home inhalers.  DuoNebs as needed

## 2024-03-06 NOTE — Assessment & Plan Note (Signed)
 Sliding scale insulin coverage

## 2024-03-06 NOTE — Assessment & Plan Note (Signed)
 Creatinine 3 up from baseline of 1.86 Likely ATN related to hypotension with renal hypoperfusion Continue IV fluid resuscitation Monitor renal function and avoid nephrotoxins

## 2024-03-06 NOTE — Progress Notes (Signed)
 Interval events noted.  He complains of pain in the left shoulder.  He said he takes oxycodone  20 mg about 4 times a day at home and requested to have oxycodone .  He said the IV fentanyl  did not do anything.  He is unhappy that he is still boarded in the emergency department.    Vital signs are stable.  BP is better.  Repeat BMP shows improvement in creatinine from 3.05-2.53.  Right renal was 1.86 about a month ago (01/19/2024).  Continue IV fluids and empiric IV antibiotics for suspected sepsis.  Follow-up blood cultures and monitor BMP.  Plan of care was discussed with [name], daughter, over the phone.

## 2024-03-06 NOTE — ED Notes (Signed)
Sling applied to LUE, pt tolerated well

## 2024-03-07 DIAGNOSIS — I951 Orthostatic hypotension: Secondary | ICD-10-CM | POA: Insufficient documentation

## 2024-03-07 DIAGNOSIS — E861 Hypovolemia: Secondary | ICD-10-CM | POA: Diagnosis not present

## 2024-03-07 LAB — BASIC METABOLIC PANEL WITH GFR
Anion gap: 9 (ref 5–15)
BUN: 24 mg/dL — ABNORMAL HIGH (ref 8–23)
CO2: 23 mmol/L (ref 22–32)
Calcium: 8.3 mg/dL — ABNORMAL LOW (ref 8.9–10.3)
Chloride: 107 mmol/L (ref 98–111)
Creatinine, Ser: 2.32 mg/dL — ABNORMAL HIGH (ref 0.61–1.24)
GFR, Estimated: 30 mL/min — ABNORMAL LOW (ref >60)
Glucose, Bld: 127 mg/dL — ABNORMAL HIGH (ref 70–99)
Potassium: 3.7 mmol/L (ref 3.5–5.1)
Sodium: 139 mmol/L (ref 135–145)

## 2024-03-07 LAB — VANCOMYCIN, TROUGH: Vancomycin Tr: 13 ug/mL — ABNORMAL LOW (ref 15–20)

## 2024-03-07 MED ORDER — MIDODRINE HCL 5 MG PO TABS
2.5000 mg | ORAL_TABLET | Freq: Two times a day (BID) | ORAL | Status: DC
  Administered 2024-03-07 – 2024-03-08 (×2): 2.5 mg via ORAL
  Filled 2024-03-07 (×2): qty 1

## 2024-03-07 MED ORDER — VANCOMYCIN HCL IN DEXTROSE 1-5 GM/200ML-% IV SOLN
1000.0000 mg | INTRAVENOUS | Status: DC
  Administered 2024-03-07: 1000 mg via INTRAVENOUS
  Filled 2024-03-07: qty 200

## 2024-03-07 MED ORDER — BUDESON-GLYCOPYRROL-FORMOTEROL 160-9-4.8 MCG/ACT IN AERO
2.0000 | INHALATION_SPRAY | Freq: Two times a day (BID) | RESPIRATORY_TRACT | Status: DC
  Administered 2024-03-07 – 2024-03-08 (×3): 2 via RESPIRATORY_TRACT
  Filled 2024-03-07: qty 5.9

## 2024-03-07 MED ORDER — MIDODRINE HCL 5 MG PO TABS: 5.0000 mg | ORAL_TABLET | Freq: Three times a day (TID) | ORAL | Status: DC

## 2024-03-07 MED ORDER — DAPAGLIFLOZIN PROPANEDIOL 10 MG PO TABS
10.0000 mg | ORAL_TABLET | Freq: Every day | ORAL | Status: DC
  Administered 2024-03-07: 10 mg via ORAL
  Filled 2024-03-07 (×2): qty 1

## 2024-03-07 NOTE — Progress Notes (Addendum)
 Progress Note    Albert Figueroa  BJY:[id number] DOB: 1955/11/10  DOA: 03/05/2024 PCP: Monique Ano, MD      Brief Narrative:    Medical records reviewed and are as summarized below:  Albert Figueroa is a 68 y.o. 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  He presented to the hospital with dizziness and a fall at home.  He said he fell backwards and hit his head and left shoulder.  He did not lose consciousness.  He complained of left shoulder pain after the fall.  He said he had fallen more than 5 times in the past month or so.  He was hypotensive when EMS arrived with systolic BP in the 70s.  He was given fluid bolus and transported to the ED for further management.   ED course and data review: BP 79/53 on arrival improving to 91/51 by the time of admission. Of  Labs notable for creatinine of 3 up from the 1.86. WBC normal at 6.2 but with lactic acid of 2.3, hemoglobin at baseline at 9.4 Respiratory viral panel negative   Assessment/Plan:   Principal Problem:   Hypotension Active Problems:   Lactic acidosis   Acute kidney injury superimposed on stage IIIb chronic kidney disease (HCC)   Sinus bradycardia   Chronic pain   Polypharmacy   History of opiate overdose treated with Narcan  infusion 12/2023   Fall at home, initial encounter   Nondisplaced fracture of left acromial process   Type 2 diabetes mellitus with renal complication (HCC)   COPD (chronic obstructive pulmonary disease) (HCC)   Moderate Lewy body dementia without behavioral disturbance, psychotic disturbance, mood disturbance, or anxiety (HCC)   History of seizure   Orthostatic hypotension    Body mass index is 27.21 kg/m.   Hypotension, orthostatic  hypotension: S/p treatment with IV fluids.  Start midodrine for orthostatic hypotension and recurrent falls.   Lactic acidosis: This is likely from hypotension and AKI.  No evidence of infection thus far.  Discontinue broad-spectrum antibiotics (cefepime , Flagyl  and vancomycin ).   AKI on CKD stage IIIb: Improving.  Creatinine down from 3.05-2.53-2.32.  Creatinine was 1.86 about a month ago. Continue to monitor creatinine.   Nondisplaced fracture of left acromial process, left shoulder pain, s/p fall with recurrent falls at home: Analgesics as needed for pain.  PT and OT evaluation. Outpatient follow-up with orthopedic surgeon Patient lives alone and may need to rehab given recurrent falls at home.   Sinus bradycardia: Improved   Comorbidities include seizure disorder on Depakote , chronic pain syndrome on oxycodone , Lewy body dementia on donepezil , COPD, type II DM, polypharmacy    Diet Order             Diet Heart Room service appropriate? Yes; Fluid consistency: Thin  Diet effective now                            Consultants: None  Procedures: None    Medications:    divalproex   1,000 mg Oral BID   donepezil   10 mg Oral QHS   enoxaparin  (LOVENOX ) injection  30 mg Subcutaneous Q24H   lactulose   20 g Oral BID   lidocaine   1 patch Transdermal  Q24H   midodrine  5 mg Oral TID WC   rosuvastatin   20 mg Oral QHS   Continuous Infusions:   Anti-infectives (From admission, onward)    Start     Dose/Rate Route Frequency Ordered Stop   03/07/24 0400  vancomycin  (VANCOCIN ) IVPB 1000 mg/200 mL premix  Status:  Discontinued        1,000 mg 200 mL/hr over 60 Minutes Intravenous Every 24 hours 03/07/24 0303 03/07/24 1058   03/07/24 0100  ceFEPIme  (MAXIPIME ) 2 g in sodium chloride  0.9 % 100 mL IVPB  Status:  Discontinued        2 g 200 mL/hr over 30 Minutes Intravenous Every 24 hours 03/06/24 0443 03/07/24 1058   03/06/24 1300  metroNIDAZOLE  (FLAGYL ) IVPB 500 mg   Status:  Discontinued        500 mg 100 mL/hr over 60 Minutes Intravenous Every 12 hours 03/06/24 0434 03/07/24 1058   03/06/24 0501  vancomycin  variable dose per unstable renal function (pharmacist dosing)  Status:  Discontinued         Does not apply See admin instructions 03/06/24 0501 03/07/24 0901   03/06/24 0445  vancomycin  (VANCOCIN ) IVPB 1000 mg/200 mL premix        1,000 mg 200 mL/hr over 60 Minutes Intravenous  Once 03/06/24 0441 03/06/24 0559   03/06/24 0000  ceFEPIme  (MAXIPIME ) 2 g in sodium chloride  0.9 % 100 mL IVPB        2 g 200 mL/hr over 30 Minutes Intravenous  Once 03/05/24 2355 03/06/24 0122   03/06/24 0000  metroNIDAZOLE  (FLAGYL ) IVPB 500 mg        500 mg 100 mL/hr over 60 Minutes Intravenous  Once 03/05/24 2355 03/06/24 0158   03/06/24 0000  vancomycin  (VANCOCIN ) IVPB 1000 mg/200 mL premix        1,000 mg 200 mL/hr over 60 Minutes Intravenous  Once 03/05/24 2355 03/06/24 0316              Family Communication/Anticipated D/C date and plan/Code Status   DVT prophylaxis: enoxaparin  (LOVENOX ) injection 30 mg Start: 03/06/24 0800     Code Status: Limited: Do not attempt resuscitation (DNR) -DNR-LIMITED -Do Not Intubate/DNI   Family Communication: Plan discussed with [name], daughter, over the phone Disposition Plan: Plan to discharge to SNF   Status is: Observation The patient will require care spanning > 2 midnights and should be moved to inpatient because: No safe discharge plan       Subjective:   Interval events noted.  He complains of left shoulder pain.  No other complaints.  No shortness of breath or chest pain.  Objective:    Vitals:   03/06/24 1528 03/06/24 2048 03/07/24 0353 03/07/24 0749  BP: (!) 158/82 (!) 150/78 (!) 146/76 (!) 148/85  Pulse: 81 69 85 99  Resp: 16 20 18 18   Temp: 98.1 F (36.7 C) 98 F (36.7 C) 98.2 F (36.8 C) 98.4 F (36.9 C)  TempSrc:    Oral  SpO2: 98% 100% 100% 97%  Weight:      Height:       No  data found.   Intake/Output Summary (Last 24 hours) at 03/07/2024 1123 Last data filed at 03/07/2024 1015 Gross per 24 hour  Intake 3715.46 ml  Output 630 ml  Net 3085.46 ml   Filed Weights   03/05/24 2354 03/06/24 0005  Weight: 88.7 kg 88.5 kg    Exam:  GEN: NAD SKIN: Warm and dry EYES: No  pallor or icterus ENT: MMM CV: RRR PULM: CTA B ABD: soft, ND, NT, +BS CNS: AAO x 3, non focal EXT: Left shoulder tenderness with some bruising around the shoulder.  Left arm in sling.        Data Reviewed:   I have personally reviewed following labs and imaging studies:  Labs: Labs show the following:   Basic Metabolic Panel: Recent Labs  Lab 03/05/24 2354 03/06/24 0019 03/06/24 1245 03/07/24 0220  NA 137  --  137 139  K 3.5  --  3.2* 3.7  CL 104  --  105 107  CO2 24  --  23 23  GLUCOSE 146*  --  109* 127*  BUN 33*  --  28* 24*  CREATININE 3.05*  --  2.53* 2.32*  CALCIUM  8.6*  --  8.2* 8.3*  MG  --  2.5*  --   --    GFR Estimated Creatinine Clearance: 32.9 mL/min (A) (by C-G formula based on SCr of 2.32 mg/dL (H)). Liver Function Tests: Recent Labs  Lab 03/05/24 2354  AST 19  ALT 8  ALKPHOS 52  BILITOT 0.9  PROT 6.5  ALBUMIN 2.8*   Recent Labs  Lab 03/05/24 2354  LIPASE 46   No results for input(s): "AMMONIA" in the last 168 hours. Coagulation profile Recent Labs  Lab 03/05/24 2354  INR 1.2    CBC: Recent Labs  Lab 03/06/24 0018  WBC 6.2  NEUTROABS 3.7  HGB 9.4*  HCT 29.6*  MCV 105.7*  PLT 148*   Cardiac Enzymes: No results for input(s): "CKTOTAL", "CKMB", "CKMBINDEX", "TROPONINI" in the last 168 hours. BNP (last 3 results) No results for input(s): "PROBNP" in the last 8760 hours. CBG: No results for input(s): "GLUCAP" in the last 168 hours. D-Dimer: No results for input(s): "DDIMER" in the last 72 hours. Hgb A1c: No results for input(s): "HGBA1C" in the last 72 hours. Lipid Profile: No results for input(s): "CHOL", "HDL",  "LDLCALC", "TRIG", "CHOLHDL", "LDLDIRECT" in the last 72 hours. Thyroid function studies: Recent Labs    03/05/24 2354  TSH 6.428*   Anemia work up: No results for input(s): "VITAMINB12", "FOLATE", "FERRITIN", "TIBC", "IRON", "RETICCTPCT" in the last 72 hours. Sepsis Labs: Recent Labs  Lab 03/05/24 2354 03/06/24 0018 03/06/24 0333 03/06/24 0509  PROCALCITON  --   --   --  0.65  WBC  --  6.2  --   --   LATICACIDVEN 2.3*  --  3.1*  --     Microbiology Recent Results (from the past 240 hours)  Resp panel by RT-PCR (RSV, Flu A&B, Covid) Anterior Nasal Swab     Status: None   Collection Time: 03/06/24 12:17 AM   Specimen: Anterior Nasal Swab  Result Value Ref Range Status   SARS Coronavirus 2 by RT PCR NEGATIVE NEGATIVE Final    Comment: (NOTE) SARS-CoV-2 target nucleic acids are NOT DETECTED.  The SARS-CoV-2 RNA is generally detectable in upper respiratory specimens during the acute phase of infection. The lowest concentration of SARS-CoV-2 viral copies this assay can detect is 138 copies/mL. A negative result does not preclude SARS-Cov-2 infection and should not be used as the sole basis for treatment or other patient management decisions. A negative result may occur with  improper specimen collection/handling, submission of specimen other than nasopharyngeal swab, presence of viral mutation(s) within the areas targeted by this assay, and inadequate number of viral copies(<138 copies/mL). A negative result must be combined with clinical observations, patient history, and  epidemiological information. The expected result is Negative.  Fact Sheet for Patients:  BloggerCourse.com  Fact Sheet for Healthcare Providers:  SeriousBroker.it  This test is no t yet approved or cleared by the [place]  FDA and  has been authorized for detection and/or diagnosis of SARS-CoV-2 by FDA under an Emergency Use Authorization (EUA).  This EUA will remain  in effect (meaning this test can be used) for the duration of the COVID-19 declaration under Section 564(b)(1) of the Act, 21 U.S.C.section 360bbb-3(b)(1), unless the authorization is terminated  or revoked sooner.       Influenza A by PCR NEGATIVE NEGATIVE Final   Influenza B by PCR NEGATIVE NEGATIVE Final    Comment: (NOTE) The Xpert Xpress SARS-CoV-2/FLU/RSV plus assay is intended as an aid in the diagnosis of influenza from Nasopharyngeal swab specimens and should not be used as a sole basis for treatment. Nasal washings and aspirates are unacceptable for Xpert Xpress SARS-CoV-2/FLU/RSV testing.  Fact Sheet for Patients: BloggerCourse.com  Fact Sheet for Healthcare Providers: SeriousBroker.it  This test is not yet approved or cleared by the [place]  FDA and has been authorized for detection and/or diagnosis of SARS-CoV-2 by FDA under an Emergency Use Authorization (EUA). This EUA will remain in effect (meaning this test can be used) for the duration of the COVID-19 declaration under Section 564(b)(1) of the Act, 21 U.S.C. section 360bbb-3(b)(1), unless the authorization is terminated or revoked.     Resp Syncytial Virus by PCR NEGATIVE NEGATIVE Final    Comment: (NOTE) Fact Sheet for Patients: BloggerCourse.com  Fact Sheet for Healthcare Providers: SeriousBroker.it  This test is not yet approved or cleared by the [place]  FDA and has been authorized for detection and/or diagnosis of SARS-CoV-2 by FDA under an Emergency Use Authorization (EUA). This EUA will remain in effect (meaning this test can be used) for the duration of the COVID-19 declaration under Section 564(b)(1) of the Act, 21 U.S.C. section 360bbb-3(b)(1), unless the authorization is terminated or revoked.  Performed at [facility], [address],  [place], [place] [address]   Blood Culture (routine x 2)     Status: None (Preliminary result)   Collection Time: 03/06/24 12:18 AM   Specimen: BLOOD  Result Value Ref Range Status   Specimen Description BLOOD RIGHT ANTECUBITAL  Final   Special Requests   Final    BOTTLES DRAWN AEROBIC AND ANAEROBIC Blood Culture results may not be optimal due to an inadequate volume of blood received in culture bottles   Culture   Final    NO GROWTH 1 DAY Performed at [facility], [address], [place], [place] [address]    Report Status PENDING  Incomplete  Blood Culture (routine x 2)     Status: None (Preliminary result)   Collection Time: 03/06/24 12:18 AM   Specimen: BLOOD  Result Value Ref Range Status   Specimen Description BLOOD BLOOD LEFT FOREARM  Final   Special Requests   Final    BOTTLES DRAWN AEROBIC AND ANAEROBIC Blood Culture results may not be optimal due to an inadequate volume of blood received in culture bottles   Culture   Final    NO GROWTH 1 DAY Performed at [facility], [address] [place], [place] [address]    Report Status PENDING  Incomplete    Procedures and diagnostic studies:  CT CHEST ABDOMEN PELVIS WO CONTRAST Result Date: 03/06/2024 CLINICAL DATA:  Recent falls and hypotension, initial encounter EXAM: CT CHEST, ABDOMEN  AND PELVIS WITHOUT CONTRAST TECHNIQUE: Multidetector CT imaging of the chest, abdomen and pelvis was performed following the standard protocol without IV contrast. RADIATION DOSE REDUCTION: This exam was performed according to the departmental dose-optimization program which includes automated exposure control, adjustment of the mA and/or kV according to patient size and/or use of iterative reconstruction technique. COMPARISON:  None Available. FINDINGS: CT CHEST FINDINGS Cardiovascular: Limited due to lack of IV contrast. Atherosclerotic calcifications of the thoracic aorta are noted without aneurysmal dilatation. Generalized  decreased attenuation is noted in the blood suggestive of anemia. Heavy coronary calcifications are noted. Mediastinum/Nodes: Thoracic inlet is within normal limits. No hilar or mediastinal adenopathy is noted. The esophagus as visualized is within normal limits. Lungs/Pleura: Lungs are well aerated bilaterally. No focal infiltrate or sizable effusion is seen. Tiny nodular densities are noted in the left upper lobe best seen on image number 53 of series 4. Patchy atelectatic changes in the right base are seen. No effusion is noted. Musculoskeletal: Degenerative changes of the thoracic spine are seen. CT ABDOMEN PELVIS FINDINGS Hepatobiliary: Gallbladder is decompressed. Mild perihepatic fluid is noted. The liver is otherwise within normal limits. Pancreas: Unremarkable. No pancreatic ductal dilatation or surrounding inflammatory changes. Spleen: Spleen is within normal limits. Mild perisplenic fluid is noted as well. Adrenals/Urinary Tract: Adrenal glands are unremarkable. Stable cysts are seen within both kidneys unchanged from the prior exam. No follow-up is recommended. No renal calculi or obstructive changes are seen. The bladder is well distended. Stomach/Bowel: The appendix is not well visualized. No obstructive or inflammatory changes of the colon are seen. Stomach is within normal limits. Few mildly dilated loops of small bowel are noted in the left mid abdomen likely representing a degree of enteritis. No true obstructive changes are seen. Vascular/Lymphatic: Aortic atherosclerosis. No enlarged abdominal or pelvic lymph nodes. Reproductive: Prostate is unremarkable. Other: Ventricular peritoneal shunt catheter is noted without complicating factors. Musculoskeletal: No acute or significant osseous findings. IMPRESSION: CT of the chest: Multiple pulmonary nodules. Most significant: Less than 6 mm left solid pulmonary nodule within the upper lobe. Per Fleischner Society Guidelines, if patient is low risk for  malignancy, no routine follow-up imaging is recommended. If patient is high risk for malignancy, a non-contrast Chest CT at 12 months is optional. If performed and the nodule is stable at 12 months, no further follow-up is recommended. These guidelines do not apply to immunocompromised patients and patients with cancer. Follow up in patients with significant comorbidities as clinically warranted. For lung cancer screening, adhere to Lung-RADS guidelines. Reference: Radiology. 2017; 284(1):228-43. No other focal abnormality is noted. CT of the abdomen and pelvis: Mildly dilated loops of jejunum with inflammatory change likely representing a degree of enteritis. No true obstructive changes are seen. Mild ascites. This may be related to the ventricular peritoneal shunt catheter. Electronically Signed   By: Violeta Grey M.D.   On: 03/06/2024 03:02   CT HEAD WO CONTRAST ( ) Result Date: 03/06/2024 CLINICAL DATA:  Two falls today. Left arm and shoulder pain. High EMS found patient hypotensive with blood pressures of 70s over 50s. Head and neck trauma. EXAM: CT HEAD WITHOUT CONTRAST CT CERVICAL SPINE WITHOUT CONTRAST TECHNIQUE: Multidetector CT imaging of the head and cervical spine was performed following the standard protocol without intravenous contrast. Multiplanar CT image reconstructions of the cervical spine were also generated. RADIATION DOSE REDUCTION: This exam was performed according to the departmental dose-optimization program which includes automated exposure control, adjustment of the mA and/or kV according  to patient size and/or use of iterative reconstruction technique. COMPARISON:  CT cervical spine 07/05/2022 MRI cervical spine 05/09/2023 and CT head 01/15/2024 FINDINGS: CT HEAD FINDINGS Brain: No acute intracranial hemorrhage, mass effect, or evidence of acute infarct. Chronic low-density bilateral subdural collections. These measure 7 mm on the right and 6 mm on the left and unchanged using similar  measuring technique. Stable ventricular caliber. Age related cerebral atrophy and chronic small vessel ischemic disease. Chronic bilateral frontal infarcts. Right occipital approach VP shunt catheter with tip terminating in the right lateral ventricle at the septum pellucidum. Vascular: No hyperdense vessel. Intracranial arterial calcification. Skull: No fracture or focal lesion. Sinuses/Orbits: No acute finding. Other: None. CT CERVICAL SPINE FINDINGS Alignment: No evidence of traumatic malalignment. Skull base and vertebrae: No acute fracture. No primary bone lesion or focal pathologic process. Soft tissues and spinal canal: No prevertebral fluid or swelling. No visible canal hematoma. Disc levels: Similar multilevel spondylosis and facet arthropathy. No severe spinal canal narrowing. Upper chest: Reported separately. Other: Carotid calcification. Partially visualized VP shunt catheter in the right neck. IMPRESSION: 1. No acute intracranial abnormality. 2. No acute fracture in the cervical spine. 3. Right occipital approach VP shunt catheter is unchanged. Stable chronic bilateral subdural collections. Stable ventricular caliber. Electronically Signed   By: Rozell Cornet M.D.   On: 03/06/2024 02:55   CT Cervical Spine Wo Contrast Result Date: 03/06/2024 CLINICAL DATA:  Two falls today. Left arm and shoulder pain. High EMS found patient hypotensive with blood pressures of 70s over 50s. Head and neck trauma. EXAM: CT HEAD WITHOUT CONTRAST CT CERVICAL SPINE WITHOUT CONTRAST TECHNIQUE: Multidetector CT imaging of the head and cervical spine was performed following the standard protocol without intravenous contrast. Multiplanar CT image reconstructions of the cervical spine were also generated. RADIATION DOSE REDUCTION: This exam was performed according to the departmental dose-optimization program which includes automated exposure control, adjustment of the mA and/or kV according to patient size and/or use of  iterative reconstruction technique. COMPARISON:  CT cervical spine 07/05/2022 MRI cervical spine 05/09/2023 and CT head 01/15/2024 FINDINGS: CT HEAD FINDINGS Brain: No acute intracranial hemorrhage, mass effect, or evidence of acute infarct. Chronic low-density bilateral subdural collections. These measure 7 mm on the right and 6 mm on the left and unchanged using similar measuring technique. Stable ventricular caliber. Age related cerebral atrophy and chronic small vessel ischemic disease. Chronic bilateral frontal infarcts. Right occipital approach VP shunt catheter with tip terminating in the right lateral ventricle at the septum pellucidum. Vascular: No hyperdense vessel. Intracranial arterial calcification. Skull: No fracture or focal lesion. Sinuses/Orbits: No acute finding. Other: None. CT CERVICAL SPINE FINDINGS Alignment: No evidence of traumatic malalignment. Skull base and vertebrae: No acute fracture. No primary bone lesion or focal pathologic process. Soft tissues and spinal canal: No prevertebral fluid or swelling. No visible canal hematoma. Disc levels: Similar multilevel spondylosis and facet arthropathy. No severe spinal canal narrowing. Upper chest: Reported separately. Other: Carotid calcification. Partially visualized VP shunt catheter in the right neck. IMPRESSION: 1. No acute intracranial abnormality. 2. No acute fracture in the cervical spine. 3. Right occipital approach VP shunt catheter is unchanged. Stable chronic bilateral subdural collections. Stable ventricular caliber. Electronically Signed   By: Rozell Cornet M.D.   On: 03/06/2024 02:55   DG Chest Port 1 View Result Date: 03/06/2024 CLINICAL DATA:  History of recent falls, possible sepsis EXAM: PORTABLE CHEST 1 VIEW COMPARISON:  01/15/2024 FINDINGS: Cardiac shadow is stable. Lungs are hypoinflated but  clear. Shunt catheter is again noted on the right. No acute bony abnormality is noted. IMPRESSION: No acute abnormality noted.  Electronically Signed   By: Violeta Grey M.D.   On: 03/06/2024 00:17   DG Shoulder 1 View Left Result Date: 03/06/2024 CLINICAL DATA:  Recent fall with left shoulder pain, initial encounter EXAM: LEFT SHOULDER COMPARISON:  None Available. FINDINGS: Distal clavicle appears within normal limits. There is a step-off in the acromion suspicious for fracture underlying bony thorax appears within normal limits. IMPRESSION: Findings suspicious for acromial fracture Electronically Signed   By: Violeta Grey M.D.   On: 03/06/2024 00:17               LOS: 0 days   Yanice Maqueda  [facility]   Pager on [email or web address]. If 7PM-7AM, please contact night-coverage at [email or web address]     03/07/2024, 11:23 AM

## 2024-03-07 NOTE — Plan of Care (Signed)

## 2024-03-07 NOTE — Evaluation (Signed)
 Physical Therapy Evaluation Patient Details Name: Albert Figueroa MRN: [id number] DOB: 10/11/1955 Today's Date: 03/07/2024  History of Present Illness  presented to ER secondary to dizziness, hypotension and fall (with L shoulder) pain; admitted for management of hypotension, bradycardia. Noted with L acromial fracture (NWB, sling L UE)  Clinical Impression  Patient resting in bed upon arrival to room; just finished morning ADL with RN/CNA at bedside.  Patient alert and oriented to self, location and general situation; unaware of year.  Does follow simple commands, but generally restless and impulsive throughout session.  Rates L UE pain 5-6/10; immobilized in sling (adjusted for position/comfort during session). Currently requiring min/mod assist for bed mobility; min/mod assist for sit/stand, standing balance and bed/chair transfer without assist device.  Does require UE support (reaching for armrest of chair) and +1 physical support for optimal support, safety; very short, shuffling and unsteady steps. Unsafe to attempt without physical assist; do anticipate need for unilateral gait device for progressive gait efforts. Orthostatics assessed during session with noted decrease in measurement with transition to upright (see vitals flowsheet for details).  Noted with change in expressive speech abilities when BP drops (difficulty getting words out, finishing sentences); improved and returned to baseline once reclined in chair.  BP 113/67 (92), HR 103 end of session.  RN informed/aware of results, position and response to upright. Would benefit from skilled PT to address above deficits and promote optimal return to PLOF.; recommend post-acute PT follow up as indicated by interdisciplinary care team.     Orthostatic VS for the past 24 hrs (Last 3 readings):  BP- Lying Pulse- Lying BP- Sitting Pulse- Sitting BP- Standing at 0 minutes Pulse- Standing at 0 minutes BP-Sitting after Transfer Pulse-Sitting after  Transfer  03/07/24 1143 131/82 108 119/63 107 101/67 111 95/75 (79) 103          If plan is discharge home, recommend the following: A lot of help with walking and/or transfers;A lot of help with bathing/dressing/bathroom   Can travel by private vehicle   Yes    Equipment Recommendations    Recommendations for Other Services       Functional Status Assessment Patient has had a recent decline in their functional status and demonstrates the ability to make significant improvements in function in a reasonable and predictable amount of time.     Precautions / Restrictions Precautions Precautions: Fall Required Braces or Orthoses: Sling Restrictions Weight Bearing Restrictions Per Provider Order: Yes LUE Weight Bearing Per Provider Order: Non weight bearing      Mobility  Bed Mobility Overal bed mobility: Needs Assistance Bed Mobility: Supine to Sit     Supine to sit: Min assist, Mod assist          Transfers Overall transfer level: Needs assistance Equipment used: None Transfers: Bed to chair/wheelchair/BSC       Squat pivot transfers: Min assist, Mod assist     General transfer comment: does require UE support (reaching for armrest of chair) and +1 physical support for optimal support, safety; very short, shuffling and unsteady steps    Ambulation/Gait               General Gait Details: deferred due to symptomatic orthostasis  Stairs            Wheelchair Mobility     Tilt Bed    Modified Rankin (Stroke Patients Only)       Balance Overall balance assessment: Needs assistance Sitting-balance support: No upper extremity supported,  Feet supported Sitting balance-Leahy Scale: Good     Standing balance support: Single extremity supported, During functional activity Standing balance-Leahy Scale: Poor                               Pertinent Vitals/Pain Pain Assessment Pain Assessment: Faces Faces Pain Scale: Hurts  even more Pain Location: L shoulder Pain Descriptors / Indicators: Aching, Grimacing, Guarding Pain Intervention(s): Limited activity within patient's tolerance, Monitored during session, Repositioned    Home Living Family/patient expects to be discharged to:: Private residence Living Arrangements: Alone Available Help at Discharge: Family;Available PRN/intermittently;Friend(s)               Additional Comments: Patient poor historian, but endorses living alone, single story home without steps to enter/exit    Prior Function Prior Level of Function : Independent/Modified Independent;History of Falls (last six months);Patient poor historian/Family not available             Mobility Comments: Ambulatory with RW for ADLs, household mobilization; does endorse multiple fall history. ADLs Comments: Does not drive; reports family assists with transportation     Extremity/Trunk Assessment   Upper Extremity Assessment Upper Extremity Assessment: LUE deficits/detail LUE Deficits / Details: L UE immobilized in sling; elbow, wrist and hand grossly at least 3-/5, limited by pain    Lower Extremity Assessment Lower Extremity Assessment: Generalized weakness (grossly at least 4-/5 throughout; no focal weakness appreciated)       Communication   Communication Communication: No apparent difficulties    Cognition Arousal: Alert Behavior During Therapy: Impulsive, Restless   PT - Cognitive impairments: History of cognitive impairments                       PT - Cognition Comments: Alert and oriented to self, location and general situation; aware of month and date, not year.  Follows simple commands; limited insight into deficits, limited safety awareness evident.         Cueing       General Comments      Exercises     Assessment/Plan    PT Assessment Patient needs continued PT services  PT Problem List Decreased strength;Decreased range of motion;Decreased  activity tolerance;Decreased knowledge of precautions;Decreased balance;Decreased mobility;Decreased coordination;Decreased cognition;Decreased knowledge of use of DME;Decreased safety awareness;Cardiopulmonary status limiting activity       PT Treatment Interventions DME instruction;Gait training;Functional mobility training;Therapeutic activities;Therapeutic exercise;Balance training;Cognitive remediation;Patient/family education    PT Goals (Current goals can be found in the Care Plan section)  Acute Rehab PT Goals Patient Stated Goal: to go home, but I don't know if I need to go home by myself PT Goal Formulation: With patient Time For Goal Achievement: 03/21/24 Potential to Achieve Goals: Fair    Frequency Min 2X/week     Co-evaluation               AM-PAC PT "6 Clicks" Mobility  Outcome Measure Help needed turning from your back to your side while in a flat bed without using bedrails?: A Lot Help needed moving from lying on your back to sitting on the side of a flat bed without using bedrails?: A Lot Help needed moving to and from a bed to a chair (including a wheelchair)?: A Lot Help needed standing up from a chair using your arms (e.g., wheelchair or bedside chair)?: A Lot Help needed to walk in hospital room?: Total Help needed climbing  3-5 steps with a railing? : Total 6 Click Score: 10    End of Session   Activity Tolerance: Treatment limited secondary to medical complications (Comment) (symptomatic orthostasis) Patient left: in chair;with chair alarm set;with call bell/phone within reach Nurse Communication: Mobility status PT Visit Diagnosis: Muscle weakness (generalized) (M62.81);Difficulty in walking, not elsewhere classified (R26.2);History of falling (Z91.81);Pain Pain - Right/Left: Left Pain - part of body: Shoulder    Time: 1610-9604 PT Time Calculation (min) (ACUTE ONLY): 34 min   Charges:   PT Evaluation $PT Eval Moderate Complexity: 1 Mod PT  Treatments $Therapeutic Activity: 8-22 mins PT General Charges $$ ACUTE PT VISIT: 1 Visit        Azadeh Hyder H. Bevin Bucks, PT, DPT, NCS 03/07/24, 11:57 AM [phone]

## 2024-03-07 NOTE — Care Management Obs Status (Signed)
 MEDICARE OBSERVATION STATUS NOTIFICATION   Patient Details  Name: Albert Figueroa MRN: [id number] Date of Birth: 1956/07/21   Medicare Observation Status Notification Given:  No (patient did want a copy)    Anise Kerns 03/07/2024, 11:26 AM

## 2024-03-07 NOTE — Plan of Care (Signed)

## 2024-03-07 NOTE — Progress Notes (Signed)
 Pharmacy Antibiotic Note  Albert Figueroa is a 68 y.o. male admitted on 03/05/2024 with infection of unknown source.  Pharmacy has been consulted for Vanc, Cefepime  dosing.  Pt is AKI on CKD , will dose vanc by levels.   Plan: Cefepime  2 gm IV X 1 given in ED on 6/7 @ 0052. Cefepime  2 gm IV Q24H ordered to start on 6/8 @ 0100.  Vancomycin  1 gm IV X 1 given in ED on 6/7 @ 0204.  Additional Vanc 1 gm IV X 1 ordered to make total loading dose of 2 gm.  - Pt is AKI on CKD.  Will dose by levels til renal function returns to baseline. - Will order Vanc level on 6/8 @ 0200. - Goal trough: 15 - 20 mcg/mL    6/8:  Vanc level @ 0220 = 13 , SUBtherapeutic   -  SrCr now 2.32 which is approximately baseline value.  Will use AUC dosing for Vanc.  Vancomycin  1 gm IV Q24H ordered to start on 6/8 @ 0300.    AUC = 494.9   Vanc trough = 14.2   Height: 5\' 11"  (180.3 cm) Weight: 88.5 kg (195 lb 1.6 oz) IBW/kg (Calculated) : 75.3  Temp (24hrs), Avg:98 F (36.7 C), Min:97.8 F (36.6 C), Max:98.1 F (36.7 C)  Recent Labs  Lab 03/05/24 2354 03/06/24 0018 03/06/24 0333 03/06/24 1245 03/07/24 0220  WBC  --  6.2  --   --   --   CREATININE 3.05*  --   --  2.53* 2.32*  LATICACIDVEN 2.3*  --  3.1*  --   --   VANCOTROUGH  --   --   --   --  13*    Estimated Creatinine Clearance: 32.9 mL/min (A) (by C-G formula based on SCr of 2.32 mg/dL (H)).    Allergies  Allergen Reactions   Gabapentin  Other (See Comments)    Lethargic, Confused   Amlodipine     Other reaction(s): Other (see comments) Caused elevated blood pressure and headaches   Ozempic (0.25 Or 0.5 Mg-Dose) [Semaglutide(0.25 Or 0.5mg -Dos)]     Lethargy.  Also dropped blood sugars too low    Antimicrobials this admission:   >>    >>   Dose adjustments this admission:   Microbiology results:  BCx:   UCx:    Sputum:    MRSA PCR:   Thank you for allowing pharmacy to be a part of this patient's care.  Armin Yerger D 03/07/2024  3:04 AM

## 2024-03-08 DIAGNOSIS — E861 Hypovolemia: Secondary | ICD-10-CM | POA: Diagnosis not present

## 2024-03-08 LAB — BASIC METABOLIC PANEL WITH GFR
Anion gap: 6 (ref 5–15)
BUN: 18 mg/dL (ref 8–23)
CO2: 26 mmol/L (ref 22–32)
Calcium: 8.6 mg/dL — ABNORMAL LOW (ref 8.9–10.3)
Chloride: 110 mmol/L (ref 98–111)
Creatinine, Ser: 1.88 mg/dL — ABNORMAL HIGH (ref 0.61–1.24)
GFR, Estimated: 39 mL/min — ABNORMAL LOW (ref >60)
Glucose, Bld: 89 mg/dL (ref 70–99)
Potassium: 3.7 mmol/L (ref 3.5–5.1)
Sodium: 142 mmol/L (ref 135–145)

## 2024-03-08 NOTE — Discharge Summary (Signed)
 Physician Discharge Summary   Patient: Albert Figueroa MRN: [id number] DOB: 1956/06/21  Admit date:     03/05/2024  Discharge date: 03/08/24  Discharge Physician: Sheril Dines   PCP: Monique Ano, MD   Recommendations at discharge:   Follow-up with PCP in 1 week Follow-up with [facility] pain clinic as scheduled on 03/08/2024  Discharge Diagnoses: Principal Problem:   Hypotension Active Problems:   Lactic acidosis   Acute kidney injury superimposed on stage IIIb chronic kidney disease (HCC)   Sinus bradycardia   Chronic pain   Polypharmacy   History of opiate overdose treated with Narcan  infusion 12/2023   Fall at home, initial encounter   Nondisplaced fracture of left acromial process   Type 2 diabetes mellitus with renal complication (HCC)   COPD (chronic obstructive pulmonary disease) (HCC)   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 *  Hospital Course:   Albert Figueroa is a 68 y.o. 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  He presented to the hospital with dizziness and a fall at home.  He said he fell backwards and hit his head and left shoulder.  He did not lose consciousness.  He complained of left shoulder pain after the fall.  He said he had fallen more than 5 times in the past month or so.  He was hypotensive when EMS arrived with systolic BP in the 70s.  He was given fluid bolus and transported to the ED for further management.     ED course and data review: BP 79/53 on arrival improving to 91/51 by the time of admission. Of  Labs notable  for creatinine of 3 up from the 1.86. WBC normal at 6.2 but with lactic acid of 2.3, hemoglobin at baseline at 9.4 Respiratory viral panel negative     Assessment and Plan:   Hypotension, orthostatic hypotension: Resolved.  S/p treatment with IV fluids.  Repeat orthostatic VS:  Orthostatic VS for the past 24 hrs:  BP- Lying Pulse- Lying BP- Sitting Pulse- Sitting BP- Standing at 0 minutes Pulse- Standing at 0 minutes  03/08/24 0800 111/69 86 116/66 91 100/75 63      Lactic acidosis: This is likely from hypotension and AKI.  No evidence of infection thus far.  He was initially treated with IV antibiotics but these were discontinued because no evidence of infection was found.     AKI on CKD stage IIIb: Improved to baseline..  Creatinine down from 3.05-2.53-2.32-1.88.   Creatinine was 1.86 about a month ago.     Nondisplaced fracture of left acromial process, left shoulder pain, s/p fall with recurrent falls at home: Analgesics as needed for pain.   Outpatient follow-up with orthopedic surgeon PT and OT recommended discharge to SNF.  However, patient declined to go to SNF.     Sinus bradycardia: Improved     Comorbidities include seizure disorder on Depakote , chronic pain syndrome on oxycodone , Lewy body dementia on donepezil , COPD, type II DM, polypharmacy    Patient wanted to be discharged from the hospital as soon as possible today because he has an appointment at the  pain clinic at [facility].  He goes there every morning and he does not want to miss the appointment today.  He also refused to go to SNF.  Siegfried Dress and Ace Holder, his 2 sons, were at the bedside.  They support their father's decision and they said that they will make sure that somebody is always at home with him.  They also said that they believe he became hypotensive because his ex-girlfriend gave him lisinopril  and muscle relaxants when she came to visit him at home.  They said patient no longer takes  lisinopril  and they believe that is why he became hypotensive necessitating admission to the hospital. Discharge plans discussed with the patient and his sons at the bedside. Discharge plan was discussed with Libby Ree, RN, at the bedside        Consultants: None Procedures performed: None Disposition: Home Diet recommendation:  Cardiac diet DISCHARGE MEDICATION: Allergies as of 03/08/2024       Reactions   Gabapentin  Other (See Comments)   Lethargic, Confused   Amlodipine    Other reaction(s): Other (see comments) Caused elevated blood pressure and headaches   Ozempic (0.25 Or 0.5 Mg-dose) [semaglutide(0.25 Or 0.5mg -dos)]    Lethargy.  Also dropped blood sugars too low        Medication List     STOP taking these medications    lidocaine  5 % Commonly known as: LIDODERM        TAKE these medications    acetaminophen  325 MG tablet Commonly known as: TYLENOL  Take 2 tablets (650 mg total) by mouth every 6 (six) hours as needed for mild pain (or Fever >/= 101).   albuterol  108 (90 Base) MCG/ACT inhaler Commonly known as: VENTOLIN  HFA Inhale into the lungs every 6 (six) hours as needed for wheezing or shortness of breath.   Dexilant 60 MG capsule Generic drug: dexlansoprazole Take 60 mg by mouth daily.   divalproex  500 MG 24 hr tablet Commonly known as: DEPAKOTE  ER Take 1,000 mg by mouth in the morning and at bedtime.   donepezil  10 MG disintegrating tablet Commonly known as: ARICEPT  ODT Take 10 mg by mouth at bedtime.   Farxiga  10 MG Tabs tablet Generic drug: dapagliflozin  propanediol Take 10 mg by mouth daily.   fenofibrate  160 MG tablet Take 160 mg by mouth daily.   ferrous sulfate  325 (65 FE) MG tablet Take 325 mg by mouth daily with breakfast.   hydrOXYzine  10 MG tablet Commonly known as: ATARAX  Take 10 mg by mouth 3 (three) times daily as needed.   lactulose  10 GM/15ML solution Commonly known as: CHRONULAC  Take 30 mLs (20 g total) by mouth 2  (two) times daily. Titrate to 1--2 bowel movements per day   naloxone  4 MG/0.1ML Liqd nasal spray kit Commonly known as: NARCAN  Place 1 spray into the nose once.   Oxycodone  HCl 20 MG Tabs Take 20 mg by mouth every 4 (four) hours.   Roflumilast  250 MCG Tabs Take 1 tablet by mouth daily.   rosuvastatin  20 MG tablet Commonly known as: CRESTOR  Take 20 mg by mouth at bedtime.   tiZANidine  4 MG capsule Commonly known as: ZANAFLEX  Take 4 mg by mouth every 12 (twelve) hours.   traZODone  50 MG tablet Commonly known as: DESYREL  Take 0.5 tablets (25 mg total) by mouth at bedtime as needed for sleep.   Trelegy Ellipta 100-62.5-25 MCG/ACT Aepb Generic drug: Fluticasone -Umeclidin-Vilant Inhale into the lungs daily.   Vitamin D  (Ergocalciferol ) 1.25 MG (50000 UNIT)  Caps capsule Commonly known as: DRISDOL  Take 1 capsule (50,000 Units total) by mouth every 7 (seven) days.        Follow-up Information     [facility] (1A). Schedule an appointment as soon as possible for a visit in 1 week(s).   Contact information: [address] [place]  [address] [phone]               Discharge Exam: Cleavon Curls Weights   03/05/24 2354 03/06/24 0005  Weight: 88.7 kg 88.5 kg   GEN: NAD SKIN: Warm and dry EYES: No pallor or icterus ENT: MMM CV: RRR PULM: CTA B ABD: soft, ND, NT, +BS CNS: AAO x 3, non focal EXT: Mild left shoulder tenderness.  Left arm is in a sling.   Condition at discharge: good  The results of significant diagnostics from this hospitalization (including imaging, microbiology, ancillary and laboratory) are listed below for reference.   Imaging Studies: CT CHEST ABDOMEN PELVIS WO CONTRAST Result Date: 03/06/2024 CLINICAL DATA:  Recent falls and hypotension, initial encounter EXAM: CT CHEST, ABDOMEN AND PELVIS WITHOUT CONTRAST TECHNIQUE: Multidetector CT imaging of the chest, abdomen and pelvis was performed  following the standard protocol without IV contrast. RADIATION DOSE REDUCTION: This exam was performed according to the departmental dose-optimization program which includes automated exposure control, adjustment of the mA and/or kV according to patient size and/or use of iterative reconstruction technique. COMPARISON:  None Available. FINDINGS: CT CHEST FINDINGS Cardiovascular: Limited due to lack of IV contrast. Atherosclerotic calcifications of the thoracic aorta are noted without aneurysmal dilatation. Generalized decreased attenuation is noted in the blood suggestive of anemia. Heavy coronary calcifications are noted. Mediastinum/Nodes: Thoracic inlet is within normal limits. No hilar or mediastinal adenopathy is noted. The esophagus as visualized is within normal limits. Lungs/Pleura: Lungs are well aerated bilaterally. No focal infiltrate or sizable effusion is seen. Tiny nodular densities are noted in the left upper lobe best seen on image number 53 of series 4. Patchy atelectatic changes in the right base are seen. No effusion is noted. Musculoskeletal: Degenerative changes of the thoracic spine are seen. CT ABDOMEN PELVIS FINDINGS Hepatobiliary: Gallbladder is decompressed. Mild perihepatic fluid is noted. The liver is otherwise within normal limits. Pancreas: Unremarkable. No pancreatic ductal dilatation or surrounding inflammatory changes. Spleen: Spleen is within normal limits. Mild perisplenic fluid is noted as well. Adrenals/Urinary Tract: Adrenal glands are unremarkable. Stable cysts are seen within both kidneys unchanged from the prior exam. No follow-up is recommended. No renal calculi or obstructive changes are seen. The bladder is well distended. Stomach/Bowel: The appendix is not well visualized. No obstructive or inflammatory changes of the colon are seen. Stomach is within normal limits. Few mildly dilated loops of small bowel are noted in the left mid abdomen likely representing a degree of  enteritis. No true obstructive changes are seen. Vascular/Lymphatic: Aortic atherosclerosis. No enlarged abdominal or pelvic lymph nodes. Reproductive: Prostate is unremarkable. Other: Ventricular peritoneal shunt catheter is noted without complicating factors. Musculoskeletal: No acute or significant osseous findings. IMPRESSION: CT of the chest: Multiple pulmonary nodules. Most significant: Less than 6 mm left solid pulmonary nodule within the upper lobe. Per Fleischner Society Guidelines, if patient is low risk for malignancy, no routine follow-up imaging is recommended. If patient is high risk for malignancy, a non-contrast Chest CT at 12 months is optional. If performed and the nodule is stable at 12 months, no further follow-up is recommended. These guidelines do not apply to  immunocompromised patients and patients with cancer. Follow up in patients with significant comorbidities as clinically warranted. For lung cancer screening, adhere to Lung-RADS guidelines. Reference: Radiology. 2017; 284(1):228-43. No other focal abnormality is noted. CT of the abdomen and pelvis: Mildly dilated loops of jejunum with inflammatory change likely representing a degree of enteritis. No true obstructive changes are seen. Mild ascites. This may be related to the ventricular peritoneal shunt catheter. Electronically Signed   By: Violeta Grey M.D.   On: 03/06/2024 03:02   CT HEAD WO CONTRAST ( ) Result Date: 03/06/2024 CLINICAL DATA:  Two falls today. Left arm and shoulder pain. High EMS found patient hypotensive with blood pressures of 70s over 50s. Head and neck trauma. EXAM: CT HEAD WITHOUT CONTRAST CT CERVICAL SPINE WITHOUT CONTRAST TECHNIQUE: Multidetector CT imaging of the head and cervical spine was performed following the standard protocol without intravenous contrast. Multiplanar CT image reconstructions of the cervical spine were also generated. RADIATION DOSE REDUCTION: This exam was performed according to the  departmental dose-optimization program which includes automated exposure control, adjustment of the mA and/or kV according to patient size and/or use of iterative reconstruction technique. COMPARISON:  CT cervical spine 07/05/2022 MRI cervical spine 05/09/2023 and CT head 01/15/2024 FINDINGS: CT HEAD FINDINGS Brain: No acute intracranial hemorrhage, mass effect, or evidence of acute infarct. Chronic low-density bilateral subdural collections. These measure 7 mm on the right and 6 mm on the left and unchanged using similar measuring technique. Stable ventricular caliber. Age related cerebral atrophy and chronic small vessel ischemic disease. Chronic bilateral frontal infarcts. Right occipital approach VP shunt catheter with tip terminating in the right lateral ventricle at the septum pellucidum. Vascular: No hyperdense vessel. Intracranial arterial calcification. Skull: No fracture or focal lesion. Sinuses/Orbits: No acute finding. Other: None. CT CERVICAL SPINE FINDINGS Alignment: No evidence of traumatic malalignment. Skull base and vertebrae: No acute fracture. No primary bone lesion or focal pathologic process. Soft tissues and spinal canal: No prevertebral fluid or swelling. No visible canal hematoma. Disc levels: Similar multilevel spondylosis and facet arthropathy. No severe spinal canal narrowing. Upper chest: Reported separately. Other: Carotid calcification. Partially visualized VP shunt catheter in the right neck. IMPRESSION: 1. No acute intracranial abnormality. 2. No acute fracture in the cervical spine. 3. Right occipital approach VP shunt catheter is unchanged. Stable chronic bilateral subdural collections. Stable ventricular caliber. Electronically Signed   By: Rozell Cornet M.D.   On: 03/06/2024 02:55   CT Cervical Spine Wo Contrast Result Date: 03/06/2024 CLINICAL DATA:  Two falls today. Left arm and shoulder pain. High EMS found patient hypotensive with blood pressures of 70s over 50s. Head and  neck trauma. EXAM: CT HEAD WITHOUT CONTRAST CT CERVICAL SPINE WITHOUT CONTRAST TECHNIQUE: Multidetector CT imaging of the head and cervical spine was performed following the standard protocol without intravenous contrast. Multiplanar CT image reconstructions of the cervical spine were also generated. RADIATION DOSE REDUCTION: This exam was performed according to the departmental dose-optimization program which includes automated exposure control, adjustment of the mA and/or kV according to patient size and/or use of iterative reconstruction technique. COMPARISON:  CT cervical spine 07/05/2022 MRI cervical spine 05/09/2023 and CT head 01/15/2024 FINDINGS: CT HEAD FINDINGS Brain: No acute intracranial hemorrhage, mass effect, or evidence of acute infarct. Chronic low-density bilateral subdural collections. These measure 7 mm on the right and 6 mm on the left and unchanged using similar measuring technique. Stable ventricular caliber. Age related cerebral atrophy and chronic small vessel ischemic disease. Chronic bilateral  frontal infarcts. Right occipital approach VP shunt catheter with tip terminating in the right lateral ventricle at the septum pellucidum. Vascular: No hyperdense vessel. Intracranial arterial calcification. Skull: No fracture or focal lesion. Sinuses/Orbits: No acute finding. Other: None. CT CERVICAL SPINE FINDINGS Alignment: No evidence of traumatic malalignment. Skull base and vertebrae: No acute fracture. No primary bone lesion or focal pathologic process. Soft tissues and spinal canal: No prevertebral fluid or swelling. No visible canal hematoma. Disc levels: Similar multilevel spondylosis and facet arthropathy. No severe spinal canal narrowing. Upper chest: Reported separately. Other: Carotid calcification. Partially visualized VP shunt catheter in the right neck. IMPRESSION: 1. No acute intracranial abnormality. 2. No acute fracture in the cervical spine. 3. Right occipital approach VP shunt  catheter is unchanged. Stable chronic bilateral subdural collections. Stable ventricular caliber. Electronically Signed   By: Rozell Cornet M.D.   On: 03/06/2024 02:55   DG Chest Port 1 View Result Date: 03/06/2024 CLINICAL DATA:  History of recent falls, possible sepsis EXAM: PORTABLE CHEST 1 VIEW COMPARISON:  01/15/2024 FINDINGS: Cardiac shadow is stable. Lungs are hypoinflated but clear. Shunt catheter is again noted on the right. No acute bony abnormality is noted. IMPRESSION: No acute abnormality noted. Electronically Signed   By: Violeta Grey M.D.   On: 03/06/2024 00:17   DG Shoulder 1 View Left Result Date: 03/06/2024 CLINICAL DATA:  Recent fall with left shoulder pain, initial encounter EXAM: LEFT SHOULDER COMPARISON:  None Available. FINDINGS: Distal clavicle appears within normal limits. There is a step-off in the acromion suspicious for fracture underlying bony thorax appears within normal limits. IMPRESSION: Findings suspicious for acromial fracture Electronically Signed   By: Violeta Grey M.D.   On: 03/06/2024 00:17    Microbiology: Results for orders placed or performed during the hospital encounter of 03/05/24  Resp panel by RT-PCR (RSV, Flu A&B, Covid) Anterior Nasal Swab     Status: None   Collection Time: 03/06/24 12:17 AM   Specimen: Anterior Nasal Swab  Result Value Ref Range Status   SARS Coronavirus 2 by RT PCR NEGATIVE NEGATIVE Final    Comment: (NOTE) SARS-CoV-2 target nucleic acids are NOT DETECTED.  The SARS-CoV-2 RNA is generally detectable in upper respiratory specimens during the acute phase of infection. The lowest concentration of SARS-CoV-2 viral copies this assay can detect is 138 copies/mL. A negative result does not preclude SARS-Cov-2 infection and should not be used as the sole basis for treatment or other patient management decisions. A negative result may occur with  improper specimen collection/handling, submission of specimen other than  nasopharyngeal swab, presence of viral mutation(s) within the areas targeted by this assay, and inadequate number of viral copies(<138 copies/mL). A negative result must be combined with clinical observations, patient history, and epidemiological information. The expected result is Negative.  Fact Sheet for Patients:  BloggerCourse.com  Fact Sheet for Healthcare Providers:  SeriousBroker.it  This test is no t yet approved or cleared by the [place]  FDA and  has been authorized for detection and/or diagnosis of SARS-CoV-2 by FDA under an Emergency Use Authorization (EUA). This EUA will remain  in effect (meaning this test can be used) for the duration of the COVID-19 declaration under Section 564(b)(1) of the Act, 21 U.S.C.section 360bbb-3(b)(1), unless the authorization is terminated  or revoked sooner.       Influenza A by PCR NEGATIVE NEGATIVE Final   Influenza B by PCR NEGATIVE NEGATIVE Final    Comment: (NOTE) The Xpert Xpress SARS-CoV-2/FLU/RSV plus  assay is intended as an aid in the diagnosis of influenza from Nasopharyngeal swab specimens and should not be used as a sole basis for treatment. Nasal washings and aspirates are unacceptable for Xpert Xpress SARS-CoV-2/FLU/RSV testing.  Fact Sheet for Patients: BloggerCourse.com  Fact Sheet for Healthcare Providers: SeriousBroker.it  This test is not yet approved or cleared by the [place]  FDA and has been authorized for detection and/or diagnosis of SARS-CoV-2 by FDA under an Emergency Use Authorization (EUA). This EUA will remain in effect (meaning this test can be used) for the duration of the COVID-19 declaration under Section 564(b)(1) of the Act, 21 U.S.C. section 360bbb-3(b)(1), unless the authorization is terminated or revoked.     Resp Syncytial Virus by PCR NEGATIVE NEGATIVE Final    Comment:  (NOTE) Fact Sheet for Patients: BloggerCourse.com  Fact Sheet for Healthcare Providers: SeriousBroker.it  This test is not yet approved or cleared by the [place]  FDA and has been authorized for detection and/or diagnosis of SARS-CoV-2 by FDA under an Emergency Use Authorization (EUA). This EUA will remain in effect (meaning this test can be used) for the duration of the COVID-19 declaration under Section 564(b)(1) of the Act, 21 U.S.C. section 360bbb-3(b)(1), unless the authorization is terminated or revoked.  Performed at [facility], [address] [place], [place] [address]   Blood Culture (routine x 2)     Status: None (Preliminary result)   Collection Time: 03/06/24 12:18 AM   Specimen: BLOOD  Result Value Ref Range Status   Specimen Description BLOOD RIGHT ANTECUBITAL  Final   Special Requests   Final    BOTTLES DRAWN AEROBIC AND ANAEROBIC Blood Culture results may not be optimal due to an inadequate volume of blood received in culture bottles   Culture   Final    NO GROWTH 2 DAYS Performed at [facility], [address] [place], [place] [address]    Report Status PENDING  Incomplete  Blood Culture (routine x 2)     Status: None (Preliminary result)   Collection Time: 03/06/24 12:18 AM   Specimen: BLOOD  Result Value Ref Range Status   Specimen Description BLOOD BLOOD LEFT FOREARM  Final   Special Requests   Final    BOTTLES DRAWN AEROBIC AND ANAEROBIC Blood Culture results may not be optimal due to an inadequate volume of blood received in culture bottles   Culture   Final    NO GROWTH 2 DAYS Performed at [facility], [address] [place], [place] [address]    Report Status PENDING  Incomplete    Labs: CBC: Recent Labs  Lab 03/06/24 0018  WBC 6.2  NEUTROABS 3.7  HGB 9.4*  HCT 29.6*  MCV 105.7*  PLT 148*   Basic Metabolic Panel: Recent Labs  Lab 03/05/24 2354  03/06/24 0019 03/06/24 1245 03/07/24 0220 03/08/24 0159  NA 137  --  137 139 142  K 3.5  --  3.2* 3.7 3.7  CL 104  --  105 107 110  CO2 24  --  23 23 26   GLUCOSE 146*  --  109* 127* 89  BUN 33*  --  28* 24* 18  CREATININE 3.05*  --  2.53* 2.32* 1.88*  CALCIUM  8.6*  --  8.2* 8.3* 8.6*  MG  --  2.5*  --   --   --    Liver Function Tests: Recent Labs  Lab 03/05/24 2354  AST 19  ALT 8  ALKPHOS 52  BILITOT 0.9  PROT 6.5  ALBUMIN 2.8*   CBG: No results for input(s): "GLUCAP" in the last 168 hours.  Discharge time spent: greater than 30 minutes.  Signed: Sheril Dines, MD [facility] 03/08/2024

## 2024-03-11 LAB — CULTURE, BLOOD (ROUTINE X 2)
Culture: NO GROWTH
Culture: NO GROWTH

## 2024-04-16 ENCOUNTER — Inpatient Hospital Stay
Admission: EM | Admit: 2024-04-16 | Discharge: 2024-04-28 | DRG: 871 | Disposition: A | Discharge status: Skilled Nursing Facility | Attending: Hospitalist | Admitting: Hospitalist

## 2024-04-16 ENCOUNTER — Encounter: Payer: Self-pay | Admitting: Emergency Medicine

## 2024-04-16 ENCOUNTER — Other Ambulatory Visit: Payer: Self-pay

## 2024-04-16 DIAGNOSIS — S42122A Displaced fracture of acromial process, left shoulder, initial encounter for closed fracture: Secondary | ICD-10-CM | POA: Diagnosis present

## 2024-04-16 DIAGNOSIS — R008 Other abnormalities of heart beat: Secondary | ICD-10-CM | POA: Diagnosis not present

## 2024-04-16 DIAGNOSIS — Z8661 Personal history of infections of the central nervous system: Secondary | ICD-10-CM

## 2024-04-16 DIAGNOSIS — R296 Repeated falls: Secondary | ICD-10-CM | POA: Diagnosis present

## 2024-04-16 DIAGNOSIS — E876 Hypokalemia: Secondary | ICD-10-CM | POA: Diagnosis present

## 2024-04-16 DIAGNOSIS — G9341 Metabolic encephalopathy: Secondary | ICD-10-CM | POA: Diagnosis present

## 2024-04-16 DIAGNOSIS — G928 Other toxic encephalopathy: Secondary | ICD-10-CM | POA: Diagnosis present

## 2024-04-16 DIAGNOSIS — E2740 Unspecified adrenocortical insufficiency: Secondary | ICD-10-CM | POA: Diagnosis present

## 2024-04-16 DIAGNOSIS — G40909 Epilepsy, unspecified, not intractable, without status epilepticus: Secondary | ICD-10-CM | POA: Diagnosis present

## 2024-04-16 DIAGNOSIS — Z79899 Other long term (current) drug therapy: Secondary | ICD-10-CM

## 2024-04-16 DIAGNOSIS — G894 Chronic pain syndrome: Secondary | ICD-10-CM | POA: Diagnosis present

## 2024-04-16 DIAGNOSIS — F0280 Dementia in other diseases classified elsewhere without behavioral disturbance: Secondary | ICD-10-CM | POA: Diagnosis present

## 2024-04-16 DIAGNOSIS — E1143 Type 2 diabetes mellitus with diabetic autonomic (poly)neuropathy: Secondary | ICD-10-CM | POA: Diagnosis present

## 2024-04-16 DIAGNOSIS — Y92009 Unspecified place in unspecified non-institutional (private) residence as the place of occurrence of the external cause: Secondary | ICD-10-CM

## 2024-04-16 DIAGNOSIS — E1122 Type 2 diabetes mellitus with diabetic chronic kidney disease: Secondary | ICD-10-CM | POA: Diagnosis present

## 2024-04-16 DIAGNOSIS — Z751 Person awaiting admission to adequate facility elsewhere: Secondary | ICD-10-CM

## 2024-04-16 DIAGNOSIS — D61818 Other pancytopenia: Secondary | ICD-10-CM | POA: Diagnosis present

## 2024-04-16 DIAGNOSIS — Z982 Presence of cerebrospinal fluid drainage device: Secondary | ICD-10-CM

## 2024-04-16 DIAGNOSIS — E860 Dehydration: Secondary | ICD-10-CM | POA: Diagnosis present

## 2024-04-16 DIAGNOSIS — Z8249 Family history of ischemic heart disease and other diseases of the circulatory system: Secondary | ICD-10-CM

## 2024-04-16 DIAGNOSIS — R410 Disorientation, unspecified: Secondary | ICD-10-CM

## 2024-04-16 DIAGNOSIS — W19XXXA Unspecified fall, initial encounter: Secondary | ICD-10-CM | POA: Diagnosis present

## 2024-04-16 DIAGNOSIS — D539 Nutritional anemia, unspecified: Secondary | ICD-10-CM | POA: Diagnosis present

## 2024-04-16 DIAGNOSIS — J439 Emphysema, unspecified: Secondary | ICD-10-CM | POA: Diagnosis present

## 2024-04-16 DIAGNOSIS — I129 Hypertensive chronic kidney disease with stage 1 through stage 4 chronic kidney disease, or unspecified chronic kidney disease: Secondary | ICD-10-CM | POA: Diagnosis present

## 2024-04-16 DIAGNOSIS — R571 Hypovolemic shock: Principal | ICD-10-CM | POA: Diagnosis present

## 2024-04-16 DIAGNOSIS — Z9841 Cataract extraction status, right eye: Secondary | ICD-10-CM

## 2024-04-16 DIAGNOSIS — R001 Bradycardia, unspecified: Secondary | ICD-10-CM | POA: Diagnosis present

## 2024-04-16 DIAGNOSIS — E7220 Disorder of urea cycle metabolism, unspecified: Secondary | ICD-10-CM | POA: Diagnosis present

## 2024-04-16 DIAGNOSIS — M549 Dorsalgia, unspecified: Secondary | ICD-10-CM | POA: Diagnosis present

## 2024-04-16 DIAGNOSIS — F17210 Nicotine dependence, cigarettes, uncomplicated: Secondary | ICD-10-CM | POA: Diagnosis present

## 2024-04-16 DIAGNOSIS — N189 Chronic kidney disease, unspecified: Principal | ICD-10-CM | POA: Diagnosis present

## 2024-04-16 DIAGNOSIS — Z66 Do not resuscitate: Secondary | ICD-10-CM | POA: Diagnosis present

## 2024-04-16 DIAGNOSIS — T50915A Adverse effect of multiple unspecified drugs, medicaments and biological substances, initial encounter: Secondary | ICD-10-CM | POA: Diagnosis present

## 2024-04-16 DIAGNOSIS — Z79891 Long term (current) use of opiate analgesic: Secondary | ICD-10-CM

## 2024-04-16 DIAGNOSIS — Z888 Allergy status to other drugs, medicaments and biological substances status: Secondary | ICD-10-CM

## 2024-04-16 DIAGNOSIS — R29898 Other symptoms and signs involving the musculoskeletal system: Secondary | ICD-10-CM | POA: Insufficient documentation

## 2024-04-16 DIAGNOSIS — G3183 Dementia with Lewy bodies: Secondary | ICD-10-CM | POA: Diagnosis present

## 2024-04-16 DIAGNOSIS — I493 Ventricular premature depolarization: Secondary | ICD-10-CM | POA: Diagnosis not present

## 2024-04-16 DIAGNOSIS — R570 Cardiogenic shock: Secondary | ICD-10-CM | POA: Diagnosis present

## 2024-04-16 DIAGNOSIS — Z9842 Cataract extraction status, left eye: Secondary | ICD-10-CM

## 2024-04-16 DIAGNOSIS — E785 Hyperlipidemia, unspecified: Secondary | ICD-10-CM | POA: Diagnosis present

## 2024-04-16 DIAGNOSIS — N179 Acute kidney failure, unspecified: Secondary | ICD-10-CM | POA: Diagnosis present

## 2024-04-16 DIAGNOSIS — K7682 Hepatic encephalopathy: Secondary | ICD-10-CM | POA: Diagnosis present

## 2024-04-16 DIAGNOSIS — N1832 Chronic kidney disease, stage 3b: Secondary | ICD-10-CM | POA: Diagnosis present

## 2024-04-16 DIAGNOSIS — Z961 Presence of intraocular lens: Secondary | ICD-10-CM | POA: Diagnosis present

## 2024-04-16 DIAGNOSIS — E870 Hyperosmolality and hypernatremia: Secondary | ICD-10-CM | POA: Diagnosis present

## 2024-04-16 DIAGNOSIS — Z833 Family history of diabetes mellitus: Secondary | ICD-10-CM

## 2024-04-16 DIAGNOSIS — G8929 Other chronic pain: Secondary | ICD-10-CM | POA: Diagnosis present

## 2024-04-16 DIAGNOSIS — Z5986 Financial insecurity: Secondary | ICD-10-CM

## 2024-04-16 LAB — COMPREHENSIVE METABOLIC PANEL WITH GFR
ALT: 10 U/L (ref 0–44)
AST: 26 U/L (ref 15–41)
Albumin: 2.8 g/dL — ABNORMAL LOW (ref 3.5–5.0)
Alkaline Phosphatase: 53 U/L (ref 38–126)
Anion gap: 11 (ref 5–15)
BUN: 49 mg/dL — ABNORMAL HIGH (ref 8–23)
CO2: 28 mmol/L (ref 22–32)
Calcium: 8.6 mg/dL — ABNORMAL LOW (ref 8.9–10.3)
Chloride: 106 mmol/L (ref 98–111)
Creatinine, Ser: 3.49 mg/dL — ABNORMAL HIGH (ref 0.61–1.24)
GFR, Estimated: 18 mL/min — ABNORMAL LOW (ref >60)
Glucose, Bld: 118 mg/dL — ABNORMAL HIGH (ref 70–99)
Potassium: 3.3 mmol/L — ABNORMAL LOW (ref 3.5–5.1)
Sodium: 145 mmol/L (ref 135–145)
Total Bilirubin: 1.6 mg/dL — ABNORMAL HIGH (ref 0.0–1.2)
Total Protein: 6.9 g/dL (ref 6.5–8.1)

## 2024-04-16 LAB — CBC
HCT: 29.1 % — ABNORMAL LOW (ref 39.0–52.0)
Hemoglobin: 9.2 g/dL — ABNORMAL LOW (ref 13.0–17.0)
MCH: 33.7 pg (ref 26.0–34.0)
MCHC: 31.6 g/dL (ref 30.0–36.0)
MCV: 106.6 fL — ABNORMAL HIGH (ref 80.0–100.0)
Platelets: 52 K/uL — ABNORMAL LOW (ref 150–400)
RBC: 2.73 MIL/uL — ABNORMAL LOW (ref 4.22–5.81)
RDW: 15.6 % — ABNORMAL HIGH (ref 11.5–15.5)
WBC: 4.5 K/uL (ref 4.0–10.5)
nRBC: 0 % (ref 0.0–0.2)

## 2024-04-16 LAB — TROPONIN I (HIGH SENSITIVITY): Troponin I (High Sensitivity): 6 ng/L (ref <18)

## 2024-04-16 MED ORDER — SODIUM CHLORIDE 0.9 % IV BOLUS
1000.0000 mL | Freq: Once | INTRAVENOUS | Status: AC
  Administered 2024-04-16: 1000 mL via INTRAVENOUS

## 2024-04-16 NOTE — ED Notes (Addendum)
 Pt reports son called EMS today b/c he was not doing good bc he was in pain. Pt reports neck and back pain 8/10. Pt reports taking 20mg  of oxycodone , last dose was this morning.. pt states this does not help. Pain is chronic per pt. Pt is poor historian. Pt states he wants to go home.

## 2024-04-16 NOTE — ED Triage Notes (Addendum)
 Patient arrives via EMS after family called this evening stating that they think the patient may be dehydrated. Patient states that he does feel weak and that he hasn't been able to eat much in quite awhile. Patient is alert and oriented X4, but he is hypotensive in triage. He denies fevers, nausea, or vomiting. Endorses chronic back pain and shoulder pain. He says he has fallen several times lately. He is having trouble telling me if he has injured anything, and says he can't really remember. He denies taking blood thinners.

## 2024-04-16 NOTE — ED Provider Notes (Addendum)
 [facility] Provider Note    Event Date/Time   First MD Initiated Contact with Patient 04/16/24 2255     (approximate)   History   Weakness  Level 5 caveat:  history/ROS limited by acute/critical illness  HPI Albert Figueroa is a 68 y.o. male with extensive past medical history including chronic pain syndromes, falls, history of prior hepatic encephalopathy, a documented history of Lewy body dementia but with short-term memory issues only, and a CSF shunt that was reportedly placed in the 1980s.  He presents tonight by EMS with concerns on the part of his family for dehydration.  He has been increasingly weak over a period of time and said he has not not had much to eat or drink in quite a while.  He said he has chronic back pain but he has had no acute pain including in his chest nor abdomen.  He said he has had several recent falls.  He denies having any fever, nausea, nor vomiting.  He does not think he has injured himself despite multiple recent falls.  He said he feels very tired.  He said he has not been urinating much and has had no diarrhea nor constipation.      Physical Exam   Triage Vital Signs: ED Triage Vitals  Encounter Vitals Group     BP 04/16/24 2158 (!) 87/62     Girls Systolic BP Percentile --      Girls Diastolic BP Percentile --      Boys Systolic BP Percentile --      Boys Diastolic BP Percentile --      Pulse Rate 04/16/24 2158 67     Resp 04/16/24 2158 18     Temp 04/16/24 2158 97.9 F (36.6 C)     Temp Source 04/16/24 2158 Oral     SpO2 04/16/24 2158 100 %     Weight --      Height --      Head Circumference --      Peak Flow --      Pain Score 04/16/24 2204 8     Pain Loc --      Pain Education --      Exclude from Growth Chart --     Most recent vital signs: Vitals:   04/16/24 2204 04/16/24 2325  BP:  101/63  Pulse:  (!) 58  Resp:  15  Temp:    SpO2: 100% 100%    General: The patient appears acute and  chronically ill though nontoxic.  He is somnolent but awakens to voice and will answer questions but seems to be confabulating. CV:  Good peripheral perfusion.  Regular rate and rhythm, normal heart sounds. Resp:  Normal effort. Speaking easily and comfortably, no accessory muscle usage nor intercostal retractions.  Lungs are clear to auscultation bilaterally. Abd:  Soft, no detention, thin body habitus.  No tenderness to palpation throughout the abdomen and no guarding. Other:  Patient is moving all 4 extremities and has no facial droop.  He has no aphasia.  He is confabulating answers to questions.  He has various bruising on his hands, arms, and some on his head that do not appear to be acute.  No large scalp hematomas.  No tenderness to palpation along the cervical spine.   ED Results / Procedures / Treatments   Labs (all labs ordered are listed, but only abnormal results are displayed) Labs Reviewed  COMPREHENSIVE METABOLIC PANEL WITH GFR -  Abnormal; Notable for the following components:      Result Value   Potassium 3.3 (*)    Glucose, Bld 118 (*)    BUN 49 (*)    Creatinine, Ser 3.49 (*)    Calcium  8.6 (*)    Albumin 2.8 (*)    Total Bilirubin 1.6 (*)    GFR, Estimated 18 (*)    All other components within normal limits  CBC - Abnormal; Notable for the following components:   RBC 2.73 (*)    Hemoglobin 9.2 (*)    HCT 29.1 (*)    MCV 106.6 (*)    RDW 15.6 (*)    Platelets 52 (*)    All other components within normal limits  AMMONIA - Abnormal; Notable for the following components:   Ammonia 53 (*)    All other components within normal limits  URINALYSIS, ROUTINE W REFLEX MICROSCOPIC  CBG MONITORING, ED  TROPONIN I (HIGH SENSITIVITY)  TROPONIN I (HIGH SENSITIVITY)     EKG  ED ECG REPORT I, Darleene Dome, the attending physician, personally viewed and interpreted this ECG.  Date: 04/16/2024 EKG Time: 22: 18 Rate: 65 Rhythm: normal sinus rhythm QRS Axis:  normal Intervals: normal ST/T Wave abnormalities: normal Narrative Interpretation: no evidence of acute ischemia    RADIOLOGY See ED course for details   PROCEDURES:  Critical Care performed: No  Procedures    IMPRESSION / MDM / ASSESSMENT AND PLAN / ED COURSE  I reviewed the triage vital signs and the nursing notes.                              Differential diagnosis includes, but is not limited to, acute on chronic renal failure, hepatic encephalopathy, acute intracranial hemorrhage, infectious process such as intra-abdominal infection, obstructive renal process such as mass/neoplasm.  Patient's presentation is most consistent with acute presentation with potential threat to life or bodily function.  Labs/studies ordered: Ammonia level, CBC, CMP, high-sensitivity troponin, EKG, CT head, CT cervical spine, CT abdomen/pelvis without contrast  Interventions/Medications given:  Medications  lactulose  (CHRONULAC ) 10 GM/15ML solution 20 g (has no administration in time range)  sodium chloride  0.9 % bolus 1,000 mL (1,000 mLs Intravenous New Bag/Given 04/16/24 2323)    (Note:  hospital course my include additional interventions and/or labs/studies not listed above.)   Patient initially borderline hypotensive upon arrival by EMS.  He seems confused but it is difficult to appreciate if this is his baseline given the reported history of dementia or if this is due to acute disease and/or hepatic encephalopathy.  Initial labs are notable for substantial acute renal failure in the setting of chronic kidney disease with creatinine of 3.5 which is about doubled from what seems to be his baseline of approximately 1.8.  He has no leukocytosis and a stable hemoglobin, both of which are reassuring.  He has no sign of an infectious process with no tachycardia, no fever, and no leukocytosis.  Given the patient's reported falls I will obtain a CT head and CT cervical spine to rule out acute  injury as well as a CT of the abdomen and pelvis without contrast to look for evidence of neoplasm or other issues that could lead to bladder outlet obstruction to explain his acute renal failure.  However I suspect this is mostly volume depletion and overall failure to thrive leading to an acute process.  He will need admission to the hospital given  his mental status and acute renal failure and I am also awaiting an ammonia level  The patient is on the cardiac monitor to evaluate for evidence of arrhythmia and/or significant heart rate changes.   Clinical Course as of 04/17/24 0057  Sat Apr 17, 2024  0028 Ammonia(!): 53 Slightly elevated ammonia level, unlikely to explain all of his symptoms, but I will order a dose of lactulose  20 g p.o. of note, it is similar to the last value we have on record for him. [CF]  0050 I personally viewed and interpreted the patient's CT head, CT cervical spine, and CT of the abdomen pelvis.  I can find no acute or emergent issues on any of the films.  Radiologist confirmed no acute abnormalities such as intracranial bleed, cervical spine injury, nor intra-abdominal mass. [CF]  0050 I am consulting the hospitalist team for admission.   [CF]  0057 I consulted by phone with the admitting hospitalist, and they will admit the patient - Dr. Lawence. [CF]    Clinical Course User Index [CF] Gordan Huxley, MD     FINAL CLINICAL IMPRESSION(S) / ED DIAGNOSES   Final diagnoses:  Acute renal failure superimposed on chronic kidney disease, unspecified acute renal failure type, unspecified CKD stage (HCC)  Confusion  Hyperammonemia (HCC)     Rx / DC Orders   ED Discharge Orders     None        Note:  This document was prepared using Dragon voice recognition software and may include unintentional dictation errors.   Gordan Huxley, MD 04/17/24 9942    Gordan Huxley, MD 04/17/24 [phone]

## 2024-04-17 ENCOUNTER — Inpatient Hospital Stay

## 2024-04-17 ENCOUNTER — Inpatient Hospital Stay
Admit: 2024-04-17 | Discharge: 2024-04-17 | Disposition: A | Discharge status: Home / Self Care | Attending: Critical Care Medicine

## 2024-04-17 ENCOUNTER — Emergency Department

## 2024-04-17 DIAGNOSIS — G9341 Metabolic encephalopathy: Secondary | ICD-10-CM | POA: Diagnosis not present

## 2024-04-17 DIAGNOSIS — D539 Nutritional anemia, unspecified: Secondary | ICD-10-CM | POA: Diagnosis present

## 2024-04-17 DIAGNOSIS — R29898 Other symptoms and signs involving the musculoskeletal system: Secondary | ICD-10-CM | POA: Insufficient documentation

## 2024-04-17 DIAGNOSIS — E785 Hyperlipidemia, unspecified: Secondary | ICD-10-CM | POA: Diagnosis present

## 2024-04-17 DIAGNOSIS — D61818 Other pancytopenia: Secondary | ICD-10-CM

## 2024-04-17 DIAGNOSIS — R001 Bradycardia, unspecified: Secondary | ICD-10-CM | POA: Diagnosis not present

## 2024-04-17 DIAGNOSIS — R4182 Altered mental status, unspecified: Secondary | ICD-10-CM | POA: Diagnosis not present

## 2024-04-17 DIAGNOSIS — I129 Hypertensive chronic kidney disease with stage 1 through stage 4 chronic kidney disease, or unspecified chronic kidney disease: Secondary | ICD-10-CM | POA: Diagnosis present

## 2024-04-17 DIAGNOSIS — K7682 Hepatic encephalopathy: Secondary | ICD-10-CM

## 2024-04-17 DIAGNOSIS — E860 Dehydration: Secondary | ICD-10-CM | POA: Diagnosis present

## 2024-04-17 DIAGNOSIS — N179 Acute kidney failure, unspecified: Secondary | ICD-10-CM | POA: Diagnosis present

## 2024-04-17 DIAGNOSIS — E2740 Unspecified adrenocortical insufficiency: Secondary | ICD-10-CM | POA: Diagnosis present

## 2024-04-17 DIAGNOSIS — E870 Hyperosmolality and hypernatremia: Secondary | ICD-10-CM | POA: Diagnosis present

## 2024-04-17 DIAGNOSIS — E1143 Type 2 diabetes mellitus with diabetic autonomic (poly)neuropathy: Secondary | ICD-10-CM | POA: Diagnosis present

## 2024-04-17 DIAGNOSIS — J439 Emphysema, unspecified: Secondary | ICD-10-CM | POA: Diagnosis present

## 2024-04-17 DIAGNOSIS — E7220 Disorder of urea cycle metabolism, unspecified: Secondary | ICD-10-CM | POA: Diagnosis present

## 2024-04-17 DIAGNOSIS — T50905A Adverse effect of unspecified drugs, medicaments and biological substances, initial encounter: Secondary | ICD-10-CM | POA: Diagnosis not present

## 2024-04-17 DIAGNOSIS — G894 Chronic pain syndrome: Secondary | ICD-10-CM

## 2024-04-17 DIAGNOSIS — S42122A Displaced fracture of acromial process, left shoulder, initial encounter for closed fracture: Secondary | ICD-10-CM | POA: Diagnosis present

## 2024-04-17 DIAGNOSIS — F0280 Dementia in other diseases classified elsewhere without behavioral disturbance: Secondary | ICD-10-CM | POA: Diagnosis present

## 2024-04-17 DIAGNOSIS — I959 Hypotension, unspecified: Secondary | ICD-10-CM | POA: Diagnosis not present

## 2024-04-17 DIAGNOSIS — F17210 Nicotine dependence, cigarettes, uncomplicated: Secondary | ICD-10-CM | POA: Diagnosis present

## 2024-04-17 DIAGNOSIS — N189 Chronic kidney disease, unspecified: Secondary | ICD-10-CM | POA: Diagnosis not present

## 2024-04-17 DIAGNOSIS — Z66 Do not resuscitate: Secondary | ICD-10-CM | POA: Diagnosis present

## 2024-04-17 DIAGNOSIS — Y92009 Unspecified place in unspecified non-institutional (private) residence as the place of occurrence of the external cause: Secondary | ICD-10-CM | POA: Diagnosis not present

## 2024-04-17 DIAGNOSIS — R571 Hypovolemic shock: Secondary | ICD-10-CM | POA: Diagnosis present

## 2024-04-17 DIAGNOSIS — W19XXXA Unspecified fall, initial encounter: Secondary | ICD-10-CM | POA: Diagnosis present

## 2024-04-17 DIAGNOSIS — R570 Cardiogenic shock: Secondary | ICD-10-CM | POA: Diagnosis present

## 2024-04-17 DIAGNOSIS — F02818 Dementia in other diseases classified elsewhere, unspecified severity, with other behavioral disturbance: Secondary | ICD-10-CM

## 2024-04-17 DIAGNOSIS — G3183 Dementia with Lewy bodies: Secondary | ICD-10-CM

## 2024-04-17 DIAGNOSIS — G928 Other toxic encephalopathy: Secondary | ICD-10-CM | POA: Diagnosis present

## 2024-04-17 DIAGNOSIS — G40909 Epilepsy, unspecified, not intractable, without status epilepticus: Secondary | ICD-10-CM | POA: Diagnosis present

## 2024-04-17 DIAGNOSIS — N1832 Chronic kidney disease, stage 3b: Secondary | ICD-10-CM | POA: Diagnosis present

## 2024-04-17 DIAGNOSIS — E1122 Type 2 diabetes mellitus with diabetic chronic kidney disease: Secondary | ICD-10-CM | POA: Diagnosis present

## 2024-04-17 LAB — TSH: TSH: 2.599 u[IU]/mL (ref 0.350–4.500)

## 2024-04-17 LAB — RESPIRATORY PANEL BY PCR

## 2024-04-17 LAB — URINE DRUG SCREEN, QUALITATIVE (ARMC ONLY)
Amphetamines, Ur Screen: NOT DETECTED
Barbiturates, Ur Screen: NOT DETECTED
Benzodiazepine, Ur Scrn: NOT DETECTED
Cannabinoid 50 Ng, Ur ~~LOC~~: NOT DETECTED
Cocaine Metabolite,Ur ~~LOC~~: NOT DETECTED
MDMA (Ecstasy)Ur Screen: NOT DETECTED
Methadone Scn, Ur: NOT DETECTED
Opiate, Ur Screen: NOT DETECTED
Phencyclidine (PCP) Ur S: NOT DETECTED
Tricyclic, Ur Screen: NOT DETECTED

## 2024-04-17 LAB — URINALYSIS, ROUTINE W REFLEX MICROSCOPIC
Bilirubin Urine: NEGATIVE
Glucose, UA: 50 mg/dL — AB
Hgb urine dipstick: NEGATIVE
Ketones, ur: NEGATIVE mg/dL
Leukocytes,Ua: NEGATIVE
Nitrite: NEGATIVE
Protein, ur: NEGATIVE mg/dL
Specific Gravity, Urine: 1.01 (ref 1.005–1.030)
pH: 5 (ref 5.0–8.0)

## 2024-04-17 LAB — CK: Total CK: 174 U/L (ref 49–397)

## 2024-04-17 LAB — CBC
HCT: 27.5 % — ABNORMAL LOW (ref 39.0–52.0)
HCT: 27 % — ABNORMAL LOW (ref 39.0–52.0)
Hemoglobin: 8.8 g/dL — ABNORMAL LOW (ref 13.0–17.0)
Hemoglobin: 8.9 g/dL — ABNORMAL LOW (ref 13.0–17.0)
MCH: 34 pg (ref 26.0–34.0)
MCH: 34.4 pg — ABNORMAL HIGH (ref 26.0–34.0)
MCHC: 32 g/dL (ref 30.0–36.0)
MCHC: 33 g/dL (ref 30.0–36.0)
MCV: 106.2 fL — ABNORMAL HIGH (ref 80.0–100.0)
MCV: 104.2 fL — ABNORMAL HIGH (ref 80.0–100.0)
Platelets: 34 K/uL — ABNORMAL LOW (ref 150–400)
Platelets: 35 K/uL — ABNORMAL LOW (ref 150–400)
RBC: 2.59 MIL/uL — ABNORMAL LOW (ref 4.22–5.81)
RBC: 2.59 MIL/uL — ABNORMAL LOW (ref 4.22–5.81)
RDW: 15.4 % (ref 11.5–15.5)
RDW: 15.4 % (ref 11.5–15.5)
WBC: 3 K/uL — ABNORMAL LOW (ref 4.0–10.5)
WBC: 3.1 K/uL — ABNORMAL LOW (ref 4.0–10.5)
nRBC: 0 % (ref 0.0–0.2)
nRBC: 0 % (ref 0.0–0.2)

## 2024-04-17 LAB — T4, FREE: Free T4: 0.71 ng/dL (ref 0.61–1.12)

## 2024-04-17 LAB — DIFFERENTIAL
Abs Immature Granulocytes: 0.01 K/uL (ref 0.00–0.07)
Basophils Absolute: 0 K/uL (ref 0.0–0.1)
Basophils Relative: 1 %
Eosinophils Absolute: 0.1 K/uL (ref 0.0–0.5)
Eosinophils Relative: 3 %
Immature Granulocytes: 0 %
Lymphocytes Relative: 46 %
Lymphs Abs: 1.4 K/uL (ref 0.7–4.0)
Monocytes Absolute: 0.3 K/uL (ref 0.1–1.0)
Monocytes Relative: 8 %
Neutro Abs: 1.3 K/uL — ABNORMAL LOW (ref 1.7–7.7)
Neutrophils Relative %: 42 %

## 2024-04-17 LAB — RESP PANEL BY RT-PCR (RSV, FLU A&B, COVID)  RVPGX2
Influenza A by PCR: NEGATIVE
Influenza B by PCR: NEGATIVE
Resp Syncytial Virus by PCR: NEGATIVE
SARS Coronavirus 2 by RT PCR: NEGATIVE

## 2024-04-17 LAB — GLUCOSE, CAPILLARY
Glucose-Capillary: 100 mg/dL — ABNORMAL HIGH (ref 70–99)
Glucose-Capillary: 127 mg/dL — ABNORMAL HIGH (ref 70–99)
Glucose-Capillary: 130 mg/dL — ABNORMAL HIGH (ref 70–99)
Glucose-Capillary: 99 mg/dL (ref 70–99)

## 2024-04-17 LAB — TECHNOLOGIST SMEAR REVIEW: Plt Morphology: DECREASED

## 2024-04-17 LAB — BASIC METABOLIC PANEL WITH GFR
Anion gap: 9 (ref 5–15)
BUN: 47 mg/dL — ABNORMAL HIGH (ref 8–23)
CO2: 27 mmol/L (ref 22–32)
Calcium: 7.9 mg/dL — ABNORMAL LOW (ref 8.9–10.3)
Chloride: 112 mmol/L — ABNORMAL HIGH (ref 98–111)
Creatinine, Ser: 3.07 mg/dL — ABNORMAL HIGH (ref 0.61–1.24)
GFR, Estimated: 21 mL/min — ABNORMAL LOW (ref >60)
Glucose, Bld: 88 mg/dL (ref 70–99)
Potassium: 3.6 mmol/L (ref 3.5–5.1)
Sodium: 148 mmol/L — ABNORMAL HIGH (ref 135–145)

## 2024-04-17 LAB — VITAMIN B12: Vitamin B-12: 741 pg/mL (ref 180–914)

## 2024-04-17 LAB — ETHANOL: Alcohol, Ethyl (B): <15 mg/dL (ref <15)

## 2024-04-17 LAB — MRSA NEXT GEN BY PCR, NASAL: MRSA by PCR Next Gen: NOT DETECTED

## 2024-04-17 LAB — VALPROIC ACID LEVEL: Valproic Acid Lvl: 89 ug/mL (ref 50–100)

## 2024-04-17 LAB — LACTIC ACID, PLASMA
Lactic Acid, Venous: 1.5 mmol/L (ref 0.5–1.9)
Lactic Acid, Venous: 1.2 mmol/L (ref 0.5–1.9)

## 2024-04-17 LAB — AMMONIA: Ammonia: 53 umol/L — ABNORMAL HIGH (ref 9–35)

## 2024-04-17 LAB — SALICYLATE LEVEL: Salicylate Lvl: <7 mg/dL — ABNORMAL LOW (ref 7.0–30.0)

## 2024-04-17 LAB — CBG MONITORING, ED: Glucose-Capillary: 83 mg/dL (ref 70–99)

## 2024-04-17 LAB — D-DIMER, QUANTITATIVE: D-Dimer, Quant: 1.02 ug{FEU}/mL — ABNORMAL HIGH (ref 0.00–0.50)

## 2024-04-17 MED ORDER — OXYCODONE HCL 5 MG PO TABS
20.0000 mg | ORAL_TABLET | ORAL | Status: DC
  Administered 2024-04-17: 20 mg via ORAL
  Filled 2024-04-17: qty 4

## 2024-04-17 MED ORDER — NOREPINEPHRINE 4 MG/250ML-% IV SOLN
4.0000 ug/min | INTRAVENOUS | Status: DC
  Administered 2024-04-17 – 2024-04-18 (×2): 4 ug/min via INTRAVENOUS
  Filled 2024-04-17 (×2): qty 250

## 2024-04-17 MED ORDER — NOREPINEPHRINE 4 MG/250ML-% IV SOLN: 0.0000 ug/min | INTRAVENOUS | Status: DC

## 2024-04-17 MED ORDER — VANCOMYCIN VARIABLE DOSE PER UNSTABLE RENAL FUNCTION (PHARMACIST DOSING): Status: DC

## 2024-04-17 MED ORDER — OXYCODONE HCL 5 MG PO TABS: 20.0000 mg | ORAL_TABLET | ORAL | Status: DC | PRN

## 2024-04-17 MED ORDER — ATROPINE SULFATE 1 MG/10ML IJ SOSY
PREFILLED_SYRINGE | INTRAMUSCULAR | Status: AC
  Filled 2024-04-17: qty 10

## 2024-04-17 MED ORDER — ROSUVASTATIN CALCIUM 20 MG PO TABS: 20.0000 mg | ORAL_TABLET | Freq: Every day | ORAL | Status: DC

## 2024-04-17 MED ORDER — SODIUM CHLORIDE 0.9 % IV BOLUS
1000.0000 mL | Freq: Once | INTRAVENOUS | Status: AC
  Administered 2024-04-17: 1000 mL via INTRAVENOUS

## 2024-04-17 MED ORDER — ONDANSETRON HCL 4 MG/2ML IJ SOLN
4.0000 mg | Freq: Four times a day (QID) | INTRAMUSCULAR | Status: DC | PRN
  Administered 2024-04-17: 4 mg via INTRAVENOUS
  Filled 2024-04-17: qty 2

## 2024-04-17 MED ORDER — MIDODRINE HCL 5 MG PO TABS
10.0000 mg | ORAL_TABLET | Freq: Three times a day (TID) | ORAL | Status: DC
  Administered 2024-04-18 – 2024-04-24 (×18): 10 mg via ORAL
  Filled 2024-04-17 (×18): qty 2

## 2024-04-17 MED ORDER — TIZANIDINE HCL 2 MG PO TABS
4.0000 mg | ORAL_TABLET | Freq: Two times a day (BID) | ORAL | Status: DC
  Administered 2024-04-17: 4 mg via ORAL
  Filled 2024-04-17: qty 2

## 2024-04-17 MED ORDER — MIDODRINE HCL 5 MG PO TABS: 10.0000 mg | ORAL_TABLET | Freq: Once | ORAL | Status: DC

## 2024-04-17 MED ORDER — DOPAMINE-DEXTROSE 3.2-5 MG/ML-% IV SOLN
0.0000 ug/kg/min | INTRAVENOUS | Status: DC
  Administered 2024-04-17: 5 ug/kg/min via INTRAVENOUS
  Filled 2024-04-17 (×2): qty 250

## 2024-04-17 MED ORDER — POTASSIUM CHLORIDE 20 MEQ PO PACK
40.0000 meq | PACK | Freq: Once | ORAL | Status: AC
  Administered 2024-04-17: 40 meq via ORAL
  Filled 2024-04-17 (×2): qty 2

## 2024-04-17 MED ORDER — DOPAMINE-DEXTROSE 3.2-5 MG/ML-% IV SOLN
INTRAVENOUS | Status: AC
  Filled 2024-04-17: qty 250

## 2024-04-17 MED ORDER — ACETAMINOPHEN 325 MG PO TABS
650.0000 mg | ORAL_TABLET | Freq: Four times a day (QID) | ORAL | Status: DC | PRN
  Administered 2024-04-18 – 2024-04-24 (×6): 650 mg via ORAL
  Filled 2024-04-17 (×9): qty 2

## 2024-04-17 MED ORDER — HYDROXYZINE HCL 10 MG PO TABS
10.0000 mg | ORAL_TABLET | Freq: Three times a day (TID) | ORAL | Status: DC | PRN
  Administered 2024-04-19 – 2024-04-28 (×6): 10 mg via ORAL
  Filled 2024-04-17 (×8): qty 1

## 2024-04-17 MED ORDER — LACTULOSE 10 GM/15ML PO SOLN: 20.0000 g | Freq: Two times a day (BID) | ORAL | Status: DC

## 2024-04-17 MED ORDER — PANTOPRAZOLE SODIUM 40 MG PO TBEC: 40.0000 mg | DELAYED_RELEASE_TABLET | Freq: Every day | ORAL | Status: DC

## 2024-04-17 MED ORDER — OXYCODONE HCL 5 MG PO TABS
5.0000 mg | ORAL_TABLET | ORAL | Status: DC | PRN
  Administered 2024-04-18 – 2024-04-25 (×22): 5 mg via ORAL
  Filled 2024-04-17 (×23): qty 1

## 2024-04-17 MED ORDER — CHLORHEXIDINE GLUCONATE CLOTH 2 % EX PADS
6.0000 | MEDICATED_PAD | Freq: Every day | CUTANEOUS | Status: DC
  Administered 2024-04-17 – 2024-04-27 (×10): 6 via TOPICAL

## 2024-04-17 MED ORDER — ACETAMINOPHEN 650 MG RE SUPP: 650.0000 mg | Freq: Four times a day (QID) | RECTAL | Status: DC | PRN

## 2024-04-17 MED ORDER — ONDANSETRON HCL 4 MG PO TABS: 4.0000 mg | ORAL_TABLET | Freq: Four times a day (QID) | ORAL | Status: DC | PRN

## 2024-04-17 MED ORDER — POTASSIUM CHLORIDE IN NACL 20-0.9 MEQ/L-% IV SOLN
INTRAVENOUS | Status: AC
  Filled 2024-04-17 (×3): qty 1000

## 2024-04-17 MED ORDER — NOREPINEPHRINE 4 MG/250ML-% IV SOLN
INTRAVENOUS | Status: AC
  Filled 2024-04-17: qty 250

## 2024-04-17 MED ORDER — LACTULOSE 10 GM/15ML PO SOLN
20.0000 g | Freq: Once | ORAL | Status: AC
  Administered 2024-04-17: 20 g via ORAL
  Filled 2024-04-17: qty 30

## 2024-04-17 MED ORDER — TRAZODONE HCL 50 MG PO TABS
25.0000 mg | ORAL_TABLET | Freq: Every evening | ORAL | Status: DC | PRN
  Administered 2024-04-18 – 2024-04-25 (×6): 25 mg via ORAL
  Filled 2024-04-17 (×7): qty 1

## 2024-04-17 MED ORDER — SODIUM CHLORIDE 0.9 % IV SOLN
2.0000 g | Freq: Three times a day (TID) | INTRAVENOUS | Status: DC
  Administered 2024-04-17 – 2024-04-18 (×4): 2 g via INTRAVENOUS
  Filled 2024-04-17 (×6): qty 2000

## 2024-04-17 MED ORDER — DEXAMETHASONE SODIUM PHOSPHATE 10 MG/ML IJ SOLN
10.0000 mg | Freq: Once | INTRAMUSCULAR | Status: AC
  Administered 2024-04-17: 10 mg via INTRAVENOUS
  Filled 2024-04-17: qty 1

## 2024-04-17 MED ORDER — ROFLUMILAST 500 MCG PO TABS
250.0000 ug | ORAL_TABLET | Freq: Every day | ORAL | Status: DC
  Administered 2024-04-19 – 2024-04-28 (×10): 250 ug via ORAL
  Filled 2024-04-17 (×13): qty 1

## 2024-04-17 MED ORDER — BUDESON-GLYCOPYRROL-FORMOTEROL 160-9-4.8 MCG/ACT IN AERO
2.0000 | INHALATION_SPRAY | Freq: Two times a day (BID) | RESPIRATORY_TRACT | Status: DC
  Administered 2024-04-17 – 2024-04-28 (×11): 2 via RESPIRATORY_TRACT
  Filled 2024-04-17: qty 5.9

## 2024-04-17 MED ORDER — DAPAGLIFLOZIN PROPANEDIOL 10 MG PO TABS
10.0000 mg | ORAL_TABLET | Freq: Every day | ORAL | Status: DC
  Filled 2024-04-17: qty 1

## 2024-04-17 MED ORDER — ALBUTEROL SULFATE (2.5 MG/3ML) 0.083% IN NEBU: 2.5000 mg | INHALATION_SOLUTION | Freq: Four times a day (QID) | RESPIRATORY_TRACT | Status: DC | PRN

## 2024-04-17 MED ORDER — NALOXONE HCL 4 MG/0.1ML NA LIQD
1.0000 | Freq: Once | NASAL | Status: AC | PRN
  Administered 2024-04-17: 1 via NASAL
  Filled 2024-04-17: qty 4

## 2024-04-17 MED ORDER — FENOFIBRATE 160 MG PO TABS
160.0000 mg | ORAL_TABLET | Freq: Every day | ORAL | Status: DC
  Filled 2024-04-17: qty 1

## 2024-04-17 MED ORDER — MAGNESIUM HYDROXIDE 400 MG/5ML PO SUSP: 30.0000 mL | Freq: Every day | ORAL | Status: DC | PRN

## 2024-04-17 MED ORDER — FERROUS SULFATE 325 (65 FE) MG PO TABS: 325.0000 mg | ORAL_TABLET | Freq: Every day | ORAL | Status: DC

## 2024-04-17 MED ORDER — SODIUM CHLORIDE 0.9 % IV BOLUS
500.0000 mL | Freq: Once | INTRAVENOUS | Status: AC
  Administered 2024-04-17: 500 mL via INTRAVENOUS

## 2024-04-17 MED ORDER — DIVALPROEX SODIUM ER 500 MG PO TB24
1000.0000 mg | ORAL_TABLET | Freq: Every day | ORAL | Status: DC
  Filled 2024-04-17: qty 2

## 2024-04-17 MED ORDER — SODIUM CHLORIDE 0.9 % IV SOLN: 250.0000 mL | INTRAVENOUS | Status: AC

## 2024-04-17 MED ORDER — DOPAMINE-DEXTROSE 3.2-5 MG/ML-% IV SOLN: INTRAVENOUS | Status: DC | PRN

## 2024-04-17 MED ORDER — ATROPINE SULFATE 1 MG/ML IV SOLN
0.5000 mg | Freq: Once | INTRAVENOUS | Status: AC
  Administered 2024-04-17: 0.5 mg via INTRAVENOUS
  Filled 2024-04-17: qty 0.5

## 2024-04-17 MED ORDER — SODIUM CHLORIDE 0.9 % IV SOLN
2.0000 g | Freq: Two times a day (BID) | INTRAVENOUS | Status: DC
  Administered 2024-04-17 – 2024-04-18 (×3): 2 g via INTRAVENOUS
  Filled 2024-04-17 (×3): qty 20

## 2024-04-17 MED ORDER — DONEPEZIL HCL 5 MG PO TABS: 10.0000 mg | ORAL_TABLET | Freq: Every day | ORAL | Status: DC

## 2024-04-17 MED ORDER — VANCOMYCIN HCL 2000 MG/400ML IV SOLN
2000.0000 mg | Freq: Once | INTRAVENOUS | Status: AC
  Administered 2024-04-17: 2000 mg via INTRAVENOUS
  Filled 2024-04-17: qty 400

## 2024-04-17 MED ORDER — NOREPINEPHRINE 4 MG/250ML-% IV SOLN
INTRAVENOUS | Status: AC
  Administered 2024-04-17: 5 ug/min via INTRAVENOUS
  Filled 2024-04-17: qty 250

## 2024-04-17 MED ORDER — NALOXONE HCL 2 MG/2ML IJ SOSY
PREFILLED_SYRINGE | INTRAMUSCULAR | Status: AC
  Filled 2024-04-17: qty 2

## 2024-04-17 NOTE — Consult Note (Addendum)
 NEUROLOGY CONSULT NOTE   Date of service: April 17, 2024 Patient Name: Albert Figueroa MRN:  [id number] DOB:  Oct 09, 1955 Chief Complaint: Weakness Requesting Provider: Parris Manna, MD  History of Present Illness  Albert Figueroa is a 67 y.o. 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  He has been noted to be confused, and there is concern for narcotic overuse last night.  During previous physician's exam, there was concern for right leg weakness and therefore neurology has been consulted and MRI is currently pending.  The patient denies any significant focal weakness.  Of note, the patient has a diagnosis of multifactorial cognitive decline, including the biopsy-proven synucleinopathy consistent with Lewy body.  He has a history of seizures but has not had a seizure in many years.  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   Osteoarthrosis    Seizure (HCC)    none since 2009   Stage 4 chronic kidney disease (HCC)    Tremor of both hands     Past Surgical History:  Procedure Laterality Date   CATARACT EXTRACTION W/PHACO Right 05/14/2023   Procedure: CATARACT EXTRACTION PHACO AND INTRAOCULAR LENS PLACEMENT (IOC) RIGHT DIABETIC;  Surgeon: Mittie Gaskin, MD;  Location: [facility];  Service: Ophthalmology;  Laterality: Right;  6.70 0:39.8   CATARACT EXTRACTION W/PHACO Left 05/28/2023   Procedure: CATARACT EXTRACTION PHACO AND INTRAOCULAR LENS PLACEMENT (IOC)  LEFT DIABETIC 6.34 00:37.1;  Surgeon: Mittie Gaskin, MD;  Location: [facility];  Service: Ophthalmology;  Laterality: Left;   CSF SHUNT     placed in 1980s   KNEE SURGERY     multiple surgeries on both knees    Family History: Family History  Problem Relation Age of Onset   Diabetes Mother    Cancer Mother    CAD Father     Social History  reports that he has been smoking cigarettes. He has a 96 pack-year smoking history. He has never used smokeless tobacco. He reports that he does not drink alcohol and does not use drugs.  Allergies  Allergen Reactions   Aricept  [Donepezil ] Other (See Comments)    SEVERE BRADYCARDIA WITH HEART BLOCK DO NOT RESTART AT DISCHARGE   Gabapentin  Other (See Comments)    Lethargic, Confused   Amlodipine     Other reaction(s): Other (see comments) Caused elevated blood pressure and headaches   Ozempic (0.25 Or 0.5 Mg-Dose) [Semaglutide(0.25 Or 0.5mg -Dos)]     Lethargy.  Also dropped blood sugars too low    Medications   Current Facility-Administered Medications:    0.9 %  sodium chloride  infusion, 250 mL, Intravenous, Continuous, Albert Figueroa, Richard, MD, Held at 04/17/24 1029   0.9 % NaCl with KCl 20 mEq/ L  infusion, , Intravenous, Continuous, Mansy, Jan A, MD, Last Rate: 100 mL/hr at 04/17/24 1526, Infusion Verify at  04/17/24 1526   acetaminophen  (TYLENOL ) tablet 650 mg, 650 mg, Oral, Q6H PRN **OR** acetaminophen  (TYLENOL ) suppository 650 mg, 650 mg, Rectal, Q6H PRN, Mansy, Jan A, MD   albuterol  (PROVENTIL ) (2.5 MG/3ML) 0.083% nebulizer solution 2.5 mg, 2.5 mg, Inhalation, Q6H PRN, Mansy, Jan A, MD   ampicillin  (OMNIPEN) 2 g in sodium chloride  0.9 % 100 mL IVPB, 2 g, Intravenous, Q8H, Wieting, Richard, MD, Last Rate: 300 mL/hr at 04/17/24 1526, Infusion Verify at 04/17/24 1526   budesonide -glycopyrrolate -formoterol  (BREZTRI ) 160-9-4.8 MCG/ACT inhaler 2 puff, 2 puff, Inhalation, BID, Mansy, Jan A, MD   cefTRIAXone  (ROCEPHIN ) 2 g in  sodium chloride  0.9 % 100 mL IVPB, 2 g, Intravenous, Q12H, Wieting, Richard, MD, Stopped at 04/17/24 9074   Chlorhexidine  Gluconate Cloth 2 % PADS 6 each, 6 each, Topical, Daily, Wieting, Richard, MD, 6 each at 04/17/24 1107   divalproex  (DEPAKOTE  ER) 24 hr tablet 1,000 mg, 1,000 mg, Oral, Daily, Mansy, Jan A, MD   DOPamine  (INTROPIN ) 800 mg in dextrose  5 % 250 mL (3.2 mg/mL) infusion, 0-20 mcg/kg/min, Intravenous, Titrated, Albert Lonell MATSU, NP, Last Rate: 5.32 mL/hr at 04/17/24 1526, 5 mcg/kg/min at 04/17/24 1526   hydrOXYzine  (ATARAX ) tablet 10 mg, 10 mg, Oral, TID PRN, Mansy, Jan A, MD   magnesium  hydroxide (MILK OF MAGNESIA) suspension 30 mL, 30 mL, Oral, Daily PRN, Mansy, Jan A, MD   midodrine  (PROAMATINE ) tablet 10 mg, 10 mg, Oral, TID WC, Mansy, Jan A, MD   norepinephrine  (LEVOPHED ) 4mg  in (0.016 mg/mL) premix infusion, 4 mcg/min, Intravenous, Titrated, Wieting, Richard, MD, Last Rate: 15 mL/hr at 04/17/24 1526, 4 mcg/min at 04/17/24 1526   ondansetron  (ZOFRAN ) tablet 4 mg, 4 mg, Oral, Q6H PRN **OR** ondansetron  (ZOFRAN ) injection 4 mg, 4 mg, Intravenous, Q6H PRN, Mansy, Jan A, MD, 4 mg at 04/17/24 9472   oxyCODONE  (Oxy IR/ROXICODONE ) immediate release tablet 5 mg, 5 mg, Oral, Q4H PRN, Albert Figueroa, Richard, MD   pantoprazole  (PROTONIX ) EC tablet 40 mg, 40 mg, Oral, Daily, Mansy, Jan A, MD   roflumilast  (DALIRESP ) tablet 250 mcg, 250 mcg, Oral, Daily, Mansy, Jan A, MD   traZODone  (DESYREL ) tablet 25 mg, 25 mg, Oral, QHS PRN, Mansy, Jan A, MD   vancomycin  variable dose per unstable renal function (pharmacist dosing), , Does not apply, See admin instructions, Tobie Cathaleen RAMAN, RPH  Vitals   Vitals:   04/17/24 1500 04/17/24 1600 04/17/24 1700 04/17/24 1800  BP: (!) 108/55 (!) 92/55 128/71 119/71  Pulse: 66 70 63 73  Resp: 12 12 11 14   Temp:      TempSrc:      SpO2: 100% 100% 100% 100%  Weight:      Height:        Body mass index is 17.43 kg/m.   Physical Exam   Constitutional:  Appears well-developed and well-nourished.  Neurologic Examination    Neuro: Mental Status: Patient is awake, alert, oriented to person, place, does not give the month or year Patient is able to give a clear and coherent history. No signs of aphasia or neglect Cranial Nerves: II: Visual Fields are full. Pupils are equal, round, and reactive to light.   III,IV, VI: EOMI without ptosis or diploplia.  V: Facial sensation is symmetric to temperature VII: Facial movement is symmetric.   Motor: Tone is normal. Bulk is normal.  He has no drift in any extremity, he does give poor effort in his legs bilaterally, secondary to pain.  This is symmetric on my exam today.  Sensory: Sensation is symmetric to light touch and temperature in the arms and legs. Cerebellar: FNF and HKS are intact bilaterally        Labs/Imaging/Neurodiagnostic studies   CBC:  Recent Labs  Lab 05/04/2024 2210 04/17/24 0633  WBC 4.5 3.1*  3.0*  NEUTROABS  --  1.3*  HGB 9.2* 8.9*  8.8*  HCT 29.1* 27.0*  27.5*  MCV 106.6* 104.2*  106.2*  PLT 52* 35*  34*   Basic Metabolic Panel:  Lab Results  Component Value Date   NA 148 (H) 04/17/2024   K 3.6 04/17/2024   CO2 27 04/17/2024   GLUCOSE 88 04/17/2024   BUN 47 (H) 04/17/2024   CREATININE 3.07 (H) 04/17/2024   CALCIUM  7.9 (L) 04/17/2024   GFRNONAA 21 (L) 04/17/2024   GFRAA >60 10/06/2012   Lipid Panel: No results found for: LDLCALC HgbA1c:  Lab Results  Component Value Date   HGBA1C 4.2 (L) 01/16/2024   Urine Drug Screen:     Component Value Date/Time   LABOPIA NONE DETECTED 04/17/2024 1520   COCAINSCRNUR NONE DETECTED 04/17/2024 1520   LABBENZ NONE DETECTED 04/17/2024 1520   AMPHETMU NONE DETECTED 04/17/2024 1520   THCU NONE DETECTED 04/17/2024 1520   LABBARB NONE DETECTED 04/17/2024 1520    Alcohol Level     Component Value Date/Time   ETH <15 04/17/2024 1141   INR  Lab Results  Component Value Date   INR 1.2 03/05/2024    APTT  Lab Results  Component Value Date   APTT 41 (H) 01/15/2024     ASSESSMENT   TOMY KHIM is a 68 y.o. male with a history of Lewy body dementia who had a transient episode of worsening of his mental stauts in the setting of pain medication.  He does have some giveway on testing and strength in his lower extremities, but reports that this is due to pain, without drift my suspicion is that this is not weakness.  I agree with getting an MRI, but if this is negative my suspicion is that his current state is due to his general medical condition and I would not pursue further workup beyond that.  RECOMMENDATIONS  Continue Depakote  1 g twice daily MRI brain Further workup only if MRI is positive  ______________________________________________________________________    Bonney Aisha Seals, MD [facility]

## 2024-04-17 NOTE — Consult Note (Signed)
 NAME:  Albert Figueroa, MRN:  [id number], DOB:  02-27-1956, LOS: 0 ADMISSION DATE:  04/16/2024, CONSULTATION DATE: 04/17/2024 REFERRING MD: Dr. Josette, CHIEF COMPLAINT: Hypotension    History of Present Illness:  Albert Figueroa is a 68 y.o. 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  Pt previously hospitalized 04/17 to 04/21 with hypotension/sinus bradycardia attributed to AKI, and opiate overdose requiring narcan  infusion.    ED Course  Upon arrival to the ER CT Head/Cervical Spine negative for acute intracranial abnormality and acute fracture/traumatic malalignment of the cervical spine.  Significant lab results were: K+ 3.3/glucose 118/BUN 49/creatinine 3.49/calcium  8.6/albumin 2.8/total bilirubin 1.6/hgb 9.2/platelet count 52.  CT Abd/Pelvis revealed no acute intra-abdominal or intrapelvic abnormality, but revealed trace simple free fluid ascites likely due to VP shunt.  While in the ER pt received 1L NS bolus and 20 g of lactulose .  Pt initially admitted to the stepdown unit per hospitalist team but remained in the ER pending bed availability.  However, while holding in the ER pt c/o pain and received scheduled 20 mg oxycodone  ir.  He became altered, hypotensive, and bradycardic with pinpoint pupils.  He received narcan , atropine , and briefly started on levophed  gtt.  PCCM team consulted, however hypotension resolved and levophed  gtt stopped PCCM teamed signed off.  Pt later became hypotensive again with recurrent sinus bradycardia despite aggressive iv fluid resuscitation requiring levophed  gtt.  PCCM team reconsulted.    Pertinent  Medical History  Arthritis  Type II Diabetes Mellitus  Emphysema/COPD  Epilepsy HLD HTN CKD Stage IV Lewy Body Dementia  Lumbar Stenosis  Spinal  Meningitis  CSF Shunt  Seizure Tremors in both Hands  Anti-infectives (From admission, onward)    Start     Dose/Rate Route Frequency Ordered Stop   04/17/24 1000  cefTRIAXone  (ROCEPHIN ) 2 g in sodium chloride  0.9 % 100 mL IVPB        2 g 200 mL/hr over 30 Minutes Intravenous Every 12 hours 04/17/24 0803     04/17/24 0900  vancomycin  (VANCOREADY) IVPB 2000 mg/400 mL        2,000 mg 200 mL/hr over 120 Minutes Intravenous  Once 04/17/24 0816 04/17/24 1123   04/17/24 0822  vancomycin  variable dose per unstable renal function (pharmacist dosing)         Does not apply See admin instructions 04/17/24 9177     04/17/24 0815  ampicillin  (OMNIPEN) 2 g in sodium chloride  0.9 % 100 mL IVPB        2 g 300 mL/hr over 20 Minutes Intravenous Every 8 hours 04/17/24 0803        Significant Hospital Events: Including procedures, antibiotic start and stop dates in addition to other pertinent events   07/18: Pt admitted to the stepdown unit with hypovolemic shock and acute toxic metabolic encephalopathy, however he remained in the ICU pending bed availability   07/19: Pt developed recurrent hypotension, sinus bradycardia, and worsening acute toxic metabolic encephalopathy requiring levophed  gtt.  Pt transferred to ICU and PCCM team consulted   Interim History / Subjective:  Pt lethargic oriented to self only but following commands.  Levophed  gtt @4mcg /min to maintain map 65 or higher.  Sinus bradycardic on telemetry monitor hr 42 to 60's  Objective    Blood pressure ROLLEN)  88/62, pulse 69, temperature 97.7 F (36.5 C), temperature source Oral, resp. rate (!) 28, SpO2 100%.        Intake/Output Summary (Last 24 hours) at 04/17/2024 1015 Last data filed at 04/17/2024 0645 Gross per 24 hour  Intake 1500 ml  Output --  Net 1500 ml   There were no vitals filed for this visit.  Examination: General: Acute on chronically-ill appearing male, NAD on RA  HENT: Supple, no JVD  Lungs: Diminished  throughout, even, non labored Cardiovascular: Sinus bradycardia, s1s2, no m/r/g, 2+ radial/1+ distal pulses, 1+ RLE edema  Abdomen: Hypoactive BS x4, soft, non distended, non tender  Extremities: Moves all extremities  Neuro: Lethargic oriented to self only, follows commands, PERRLA GU: External catheter in place   Resolved problem list   Assessment and Plan   #Acute toxic metabolic encephalopathy  #Seizure disorder #Chronic pain  #Recurrent falls  Hx: Lewy body dementia, meningitis s/p VP shunt, and epilepsy  - Correct metabolic derangements  - Avoid sedating medications as able  - Urine drug screen/ethanol level/salicylate level pending  - Seizure/Fall precautions - Continue lactulose   - MRI Brain pending  - Neurology consulted appreciate input  - Once able to tolerate po's resume depakote , aricept , and zanaflex  - Consider reducing outpatient oxycodone  dose once able to resume po's  - Will need PT/OT  #Hypotension: multifactorial hypovolemia and possible medication induced  #Sinus bradycardia  Hx: HLD and HTN  - Continuous telemetry monitoring  - Prn levophed  or dopamine  gtt to maintain map 65 or higher  - Troponin negative  - Hold outpatient diuretics and antihypertensives for now  - Resume outpatient rosuvastatin  once able to take po's  - Thyroid  panel pending  - Echo pending  - Will r/o sepsis   #Emphysema/COPD without exacerbation  - Supplemental O2 for dyspnea and/or hypoxia  - Maintain O2 sats 88% to 92% - Scheduled and prn bronchodilator therapy  - Intermittent CXR   #Acute kidney injury superimposed on CKD stage IV #Hypernatremia  - Trend CMP  - Replace electrolytes as indicated  - Strict I&O's - Avoid nephrotoxic agents as able   #Leukopenia  - Trend WBC and monitor fever curve - Follow cultures  - Continue empiric abx as outlined above due to concern for possible sepsis pending culture results and sensitivities   #Acute on chronic thrombocytopenia   #Macrocytic anemia~no signs of active bleeding  - Trend CBC  - Monitor for s/sx of bleeding  - Transfuse for hgb <7 and/or platelet count <20,000 - Folic acid  and vitamin B12 levels pending   #Type II diabetes mellitus  - CBG's q4hrs  - Target CBG readings 140 to 180 - Follow hypo/hyperglycemic protocol   Best Practice (right click and Reselect all SmartList Selections daily)   Diet/type: NPO DVT prophylaxis SCD Pressure ulcer(s): N/A GI prophylaxis: PPI Lines: N/A Foley:  N/A Code Status:  DNR Last date of multidisciplinary goals of care discussion [N/A]  Labs   CBC: Recent Labs  Lab 04/16/24 2210 04/17/24 0633  WBC 4.5 3.1*  3.0*  NEUTROABS  --  1.3*  HGB 9.2* 8.9*  8.8*  HCT 29.1* 27.0*  27.5*  MCV 106.6* 104.2*  106.2*  PLT 52* 35*  34*    Basic Metabolic Panel: Recent Labs  Lab 04/16/24 2210 04/17/24 0633  NA 145 148*  K 3.3* 3.6  CL 106 112*  CO2 28 27  GLUCOSE 118* 88  BUN 49* 47*  CREATININE 3.49* 3.07*  CALCIUM  8.6* 7.9*  GFR: CrCl cannot be calculated (Unknown ideal weight.). Recent Labs  Lab 04/16/24 2210 04/17/24 0633  WBC 4.5 3.1*  3.0*    Liver Function Tests: Recent Labs  Lab 04/16/24 2210  AST 26  ALT 10  ALKPHOS 53  BILITOT 1.6*  PROT 6.9  ALBUMIN 2.8*   No results for input(s): LIPASE, AMYLASE in the last 168 hours. Recent Labs  Lab 04/16/24 2354  AMMONIA 53*    ABG    Component Value Date/Time   HCO3 30.4 (H) 07/05/2022 1405   O2SAT 89.6 07/05/2022 1405     Coagulation Profile: No results for input(s): INR, PROTIME in the last 168 hours.  Cardiac Enzymes: No results for input(s): CKTOTAL, CKMB, CKMBINDEX, TROPONINI in the last 168 hours.  HbA1C: Hgb A1c MFr Bld  Date/Time Value Ref Range Status  01/16/2024 05:28 AM 4.2 (L) 4.8 - 5.6 % Final    Comment:    (NOTE) Pre diabetes:          5.7%-6.4%  Diabetes:              >6.4%  Glycemic control for   <7.0% adults with  diabetes   09/01/2022 06:41 PM 5.0 4.8 - 5.6 % Final    Comment:    (NOTE)         Prediabetes: 5.7 - 6.4         Diabetes: >6.4         Glycemic control for adults with diabetes: <7.0     CBG: Recent Labs  Lab 04/17/24 0506  GLUCAP 83    Review of Systems:   Unable to assess pt confused   Past Medical History:  He,  has a past medical history of Arthritis, Diabetes mellitus without complication (HCC), Emphysema/COPD (HCC), Epilepsy (HCC), History of brain shunt, Hyperlipidemia, Hypertension, Kidney disease, Lewy body dementia (HCC), Lumbar stenosis, Meningitis spinal, Osteoarthrosis, Seizure (HCC), Stage 4 chronic kidney disease (HCC), and Tremor of both hands.   Surgical History:   Past Surgical History:  Procedure Laterality Date   CATARACT EXTRACTION W/PHACO Right 05/14/2023   Procedure: CATARACT EXTRACTION PHACO AND INTRAOCULAR LENS PLACEMENT (IOC) RIGHT DIABETIC;  Surgeon: Mittie Gaskin, MD;  Location: [facility];  Service: Ophthalmology;  Laterality: Right;  6.70 0:39.8   CATARACT EXTRACTION W/PHACO Left 05/28/2023   Procedure: CATARACT EXTRACTION PHACO AND INTRAOCULAR LENS PLACEMENT (IOC) LEFT DIABETIC 6.34 00:37.1;  Surgeon: Mittie Gaskin, MD;  Location: [facility];  Service: Ophthalmology;  Laterality: Left;   CSF SHUNT     placed in 1980s   KNEE SURGERY     multiple surgeries on both knees     Social History:   reports that he has been smoking cigarettes. He has a 96 pack-year smoking history. He has never used smokeless tobacco. He reports that he does not drink alcohol and does not use drugs.   Family History:  His family history includes CAD in his father; Cancer in his mother; Diabetes in his mother.   Allergies Allergies  Allergen Reactions   Gabapentin  Other (See Comments)    Lethargic, Confused   Amlodipine     Other reaction(s): Other (see comments) Caused elevated blood pressure and headaches   Ozempic (0.25 Or 0.5  Mg-Dose) [Semaglutide(0.25 Or 0.5mg -Dos)]     Lethargy.  Also dropped blood sugars too low     Home Medications  Prior to Admission medications   Medication Sig Start Date End Date Taking? Authorizing Provider  acetaminophen  (TYLENOL ) 325 MG tablet  Take 2 tablets (650 mg total) by mouth every 6 (six) hours as needed for mild pain (or Fever >/= 101). 09/07/22  Yes Krishnan, Sendil K, MD  albuterol  (VENTOLIN  HFA) 108 [phone] Base) MCG/ACT inhaler Inhale into the lungs every 6 (six) hours as needed for wheezing or shortness of breath.   Yes [provider]  cyclobenzaprine  (FEXMID ) 7.5 MG tablet Take 7.5 mg by mouth 3 (three) times daily. 03/08/24  Yes [provider]  dexlansoprazole (DEXILANT) 60 MG capsule Take 60 mg by mouth daily.   Yes [provider]  divalproex  (DEPAKOTE  ER) 500 MG 24 hr tablet Take 1,000 mg by mouth in the morning and at bedtime.   Yes [provider]  donepezil  (ARICEPT  ODT) 10 MG disintegrating tablet Take 10 mg by mouth at bedtime.   Yes [provider]  fenofibrate  160 MG tablet Take 160 mg by mouth daily. 04/25/22  Yes [provider]  ferrous sulfate  325 (65 FE) MG tablet Take 325 mg by mouth daily with breakfast.   Yes [provider]  Fluticasone -Umeclidin-Vilant (TRELEGY ELLIPTA) 100-62.5-25 MCG/ACT AEPB Inhale into the lungs daily.   Yes [provider]  furosemide (LASIX) 20 MG tablet Take 20 mg by mouth daily. 03/19/24  Yes [provider]  hydrOXYzine  (ATARAX ) 10 MG tablet Take 10 mg by mouth 3 (three) times daily as needed.   Yes [provider]  lidocaine  (LIDODERM ) 5 % Place 1 patch onto the skin daily. 04/05/24  Yes [provider]  lisinopril  (ZESTRIL ) 20 MG tablet Take 20 mg by mouth daily. 03/23/24  Yes [provider]  naloxone  (NARCAN ) nasal spray 4 mg/0.1 mL Place 1 spray into the nose once. 11/29/22  Yes [provider]  Oxycodone  HCl 20 MG TABS  Take 20 mg by mouth every 4 (four) hours. 02/09/24  Yes [provider]  rosuvastatin  (CRESTOR ) 20 MG tablet Take 20 mg by mouth at bedtime. 04/08/22  Yes [provider]  tiZANidine  (ZANAFLEX ) 4 MG capsule Take 4 mg by mouth every 12 (twelve) hours.   Yes [provider]  dapagliflozin  propanediol (FARXIGA ) 10 MG TABS tablet Take 10 mg by mouth daily. Patient not taking: Reported on 04/17/2024    [provider]  lactulose  (CHRONULAC ) 10 GM/15ML solution Take 30 mLs (20 g total) by mouth 2 (two) times daily. Titrate to 1--2 bowel movements per day Patient not taking: Reported on 04/17/2024 01/01/24   Von Bellis, MD  Roflumilast  250 MCG TABS Take 1 tablet by mouth daily. Patient not taking: Reported on 04/17/2024    [provider]  traZODone  (DESYREL ) 50 MG tablet Take 0.5 tablets (25 mg total) by mouth at bedtime as needed for sleep. Patient not taking: Reported on 04/17/2024 12/24/23   Barbarann Nest, MD     Critical care time: 60 minutes     Lonell Moose, AGNP  Pulmonary/Critical Care Pager [phone] (please enter 7 digits) PCCM Consult Pager [phone] (please enter 7 digits)

## 2024-04-17 NOTE — Assessment & Plan Note (Addendum)
 Lower dose of pain medications if needed.  But with lethargy likely will hold off.

## 2024-04-17 NOTE — Assessment & Plan Note (Addendum)
 Not sure if this is secondary to stroke or because of his altered mental status.  MRI of the brain ordered.  CT scan of the head negative.

## 2024-04-17 NOTE — Progress Notes (Signed)
 SLP Cancellation Note  Patient Details Name: Albert Figueroa MRN: [id number] DOB: 05-29-1956   Cancelled treatment:       Reason Eval/Treat Not Completed: Medical issues which prohibited therapy;Patient's level of consciousness (RN reporting reduced LOC and low BP and requested HOLD for bedside swallow eval.)  Recommend pt remain NPO until medically stable for PO/completion of BSE. MD in agreement and diet pulled. SLP will follow for readiness for evaluation.   [place] Xaivier Malay Clapp, MS, CCC-SLP Speech Language Pathologist Rehab Services; [facility] [phone] (ascom)     [place] J Clapp 04/17/2024, 10:41 AM

## 2024-04-17 NOTE — ED Notes (Signed)
 Laneta RN aware of assigned bed

## 2024-04-17 NOTE — ED Notes (Signed)
 [name], MD notified of pt rectal temp.

## 2024-04-17 NOTE — ED Notes (Signed)
 New order to change oxycodone  to PRN.

## 2024-04-17 NOTE — Assessment & Plan Note (Addendum)
 Could be septic shock with hypothermia, hypotension, leukopenia and acute kidney injury.  IV fluid hydration and fluid boluses.  Started Levophed .  Spoke with critical care team about maybe using dopamine  to help out with heart rate.

## 2024-04-17 NOTE — Assessment & Plan Note (Addendum)
 Case discussed with critical care team to maybe use dopamine  to help out with heart rate.  Given atropine  in the ER.

## 2024-04-17 NOTE — Assessment & Plan Note (Addendum)
 Could be secondary to infection or stroke.  MRI of the brain ordered.  Ampicillin  Rocephin  and vancomycin  ordered just in case infection with VP shunt.  1 dose Decadron  ordered.  Platelet count too low for procedures.  Blood cultures ordered.  Ammonia level borderline but could be seen with Depakote  also.  Given a dose of lactulose  in ER.

## 2024-04-17 NOTE — Consult Note (Signed)
 Pharmacy Antibiotic Note  Albert Figueroa is a 68 y.o. male admitted on 04/16/2024 with questionable meningitis.  Pharmacy has been consulted for vancomycin  dosing. Afeb, WBC 4.5 > 3. Plt low at 34.   Plan: Continue ceftriaxone  2 g BID Continue amp 2 g q8H. Predicted CrCl 29 ml/min  Will give vancomycin  2000 mg loading dose. Due to elevated creatinine will dose by levels.      Temp (24hrs), Avg:97.3 F (36.3 C), Min:96.6 F (35.9 C), Max:97.9 F (36.6 C)  Recent Labs  Lab 04/16/24 2210 04/17/24 0633  WBC 4.5 3.0*  CREATININE 3.49* 3.07*    CrCl cannot be calculated (Unknown ideal weight.).    Allergies  Allergen Reactions   Gabapentin  Other (See Comments)    Lethargic, Confused   Amlodipine     Other reaction(s): Other (see comments) Caused elevated blood pressure and headaches   Ozempic (0.25 Or 0.5 Mg-Dose) [Semaglutide(0.25 Or 0.5mg -Dos)]     Lethargy.  Also dropped blood sugars too low    Antimicrobials this admission: 7/19 vancomycin  >>  7/19 ampicillin  >>  7/19 ceftriaxone  >>   Dose adjustments this admission: None  Microbiology results: 7/19 BCx: pending  Thank you for allowing pharmacy to be a part of this patient's care.  Cathaleen GORMAN Blanch, PharmD, BCPS 04/17/2024 8:18 AM

## 2024-04-17 NOTE — Assessment & Plan Note (Signed)
 Continue to monitor

## 2024-04-17 NOTE — ED Notes (Signed)
 1st blood cultures collected. Unable to obtain second set at this time and lab called

## 2024-04-17 NOTE — Progress Notes (Signed)
*  PRELIMINARY RESULTS* Echocardiogram 2D Echocardiogram has been performed.  Albert Figueroa Albert Figueroa 04/17/2024, 5:05 PM

## 2024-04-17 NOTE — Plan of Care (Signed)
  Problem: Clinical Measurements: Goal: Will remain free from infection Outcome: Progressing Goal: Respiratory complications will improve Outcome: Progressing   Problem: Education: Goal: Knowledge of General Education information will improve Description: Including pain rating scale, medication(s)/side effects and non-pharmacologic comfort measures Outcome: Not Progressing   Problem: Health Behavior/Discharge Planning: Goal: Ability to manage health-related needs will improve Outcome: Not Progressing   Problem: Clinical Measurements: Goal: Ability to maintain clinical measurements within normal limits will improve Outcome: Not Progressing Goal: Diagnostic test results will improve Outcome: Not Progressing Goal: Cardiovascular complication will be avoided Outcome: Not Progressing   Problem: Activity: Goal: Risk for activity intolerance will decrease Outcome: Not Progressing

## 2024-04-17 NOTE — ED Notes (Addendum)
 Dr. Malvina at bedside. Pt placed on 02 6L

## 2024-04-17 NOTE — Assessment & Plan Note (Addendum)
Secondary to sepsis with systolic in the 90s Hold antihypertensives and resume when appropriate  

## 2024-04-17 NOTE — Assessment & Plan Note (Addendum)
-   Hold statin 

## 2024-04-17 NOTE — ED Notes (Signed)
 Pt given sip of water and spoon of apple sauce. Pt had delayed swallowing. Will hold PO meds until SLP eval

## 2024-04-17 NOTE — Progress Notes (Signed)
 Albert Figueroa is a 68 y.o. 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 Pt admitted to [facility] service, and while holding in the ED he apparently c/o pain and he received the scheduled oxy 20 mg IR and became altered, hypotenisive and brady with pinpoint pupils. Patient received Narcan  and Atropine , then briefly started on Levo. Symptoms improved now off Levo with stable last noted BP at bedside 123/76 with MAP 93 AT 0635. PCCM will sign off, please re-consult if symptoms does not improved.    [name], DNP, CCRN, FNP-C, AGACNP-BC Acute Care & Family Nurse Practitioner  [facility]  See Amion for personal pager PCCM on call pager [phone] until 7 am

## 2024-04-17 NOTE — H&P (Signed)
 [place]   PATIENT NAME: Albert Figueroa    MR#:  [id number]  DATE OF BIRTH:  Mar 22, 1956  DATE OF ADMISSION:  04/16/2024  PRIMARY CARE PHYSICIAN: Alla Amis, MD   Patient is coming from: Home  REQUESTING/REFERRING PHYSICIAN: Gordan Huxley, MD  CHIEF COMPLAINT:   Chief Complaint  Patient presents with   Weakness    HISTORY OF PRESENT ILLNESS:  Albert Figueroa is a 68 y.o. male with medical history significant for type 2 diabetes mellitus, hypertension, dyslipidemia, stage III-IV chronic kidney disease, Lewy body dementia, VP shunt, lumbar stenosis and chronic pain and seizure, who presented to the emergency room with acute onset of generalized weakness and concern for dehydration.  He has not had much appetite for a while.  He has chronic pain in the back and occasionally in the chest and has been on scheduled oxycodone .  He has been having recent recurrent falls.  No fever or chills.  No nausea or vomiting or abdominal pain.  No head injuries.  He is been having slightly diminished urine output.  No melena or bright red bleeding per rectum.  He denies any abdominal pain or diarrhea or constipation.  ED Course: When the patient came to the ER, BP was 101/63 with heart rate of 58.  Later on BP was 82/59.  Vital signs were otherwise normal.  Labs revealed mild hypokalemia of 3.3 and a BUN of 49 with a creatinine of 3.49 above previous levels of 18/1.88 on 03/08/2024.  Albumin was 2.8 and total bili 1.6 with ammonia level of 53.  High-sensitivity troponin I was 6.  CBC showed anemia with hemoglobin 9.2 hematocrit 29.1 close to baseline and platelets were low at 52 EKG as reviewed by me : EKG showed s mild sinus bradycardia with rate of 52 with ectopic atrial rhythm and Q waves inferiorly. Imaging: Noncontrasted CT scan revealed no acute ventricular abnormality.  C-spine CT showed no fracture or traumatic malalignment of the spine. Abdominal and pelvic CT scan revealed the  following: 1. No acute intra-abdominal or intrapelvic abnormality with limited evaluation on this noncontrast study. 2. Trace simple free fluid ascites likely due to VP shunt. 3. Aortic Atherosclerosis (ICD10-I70.0) -severe, including four-vessel coronary calcification and aortic valve leaflet calcification-correlate for aortic stenosis.  The patient was given p.o. oxycodone  earlier as well as 500 mL IV normal saline bolus and 40 mill equivalent p.o. potassium chloride  with 20 g of p.o. lactulose . 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   Osteoarthrosis    Seizure (HCC)    none since 2009   Stage 4 chronic kidney disease (HCC)    Tremor of both hands     PAST SURGICAL HISTORY:   Past Surgical History:  Procedure Laterality Date   CATARACT EXTRACTION W/PHACO Right 05/14/2023   Procedure: CATARACT EXTRACTION PHACO AND INTRAOCULAR LENS PLACEMENT (IOC) RIGHT DIABETIC;  Surgeon: Mittie Gaskin, MD;  Location: [facility];  Service: Ophthalmology;  Laterality: Right;  6.70 0:39.8   CATARACT EXTRACTION W/PHACO Left 05/28/2023   Procedure: CATARACT EXTRACTION PHACO AND INTRAOCULAR LENS PLACEMENT (IOC) LEFT DIABETIC 6.34 00:37.1;  Surgeon: Mittie Gaskin, MD;  Location: [facility];  Service: Ophthalmology;  Laterality: Left;   CSF SHUNT     placed in 1980s   KNEE SURGERY     multiple surgeries on both knees    SOCIAL HISTORY:   Social History   Tobacco Use   Smoking status: Every Day    Current packs/day: 2.00    Average packs/day: 2.0  packs/day for 48.0 years (96.0 ttl pk-yrs)    Types: Cigarettes   Smokeless tobacco: Never   Tobacco comments:    has cut back to 1 PPD  Substance Use Topics   Alcohol use: No    FAMILY HISTORY:   Family History  Problem Relation Age of Onset   Diabetes Mother    Cancer Mother    CAD Father     DRUG ALLERGIES:   Allergies  Allergen Reactions   Gabapentin  Other (See Comments)    Lethargic, Confused   Amlodipine     Other reaction(s): Other (see comments) Caused elevated blood pressure and headaches   Ozempic (0.25 Or 0.5 Mg-Dose) [Semaglutide(0.25 Or 0.5mg -Dos)]     Lethargy.  Also dropped blood sugars too low    REVIEW OF SYSTEMS:   ROS As per history of present illness. All pertinent systems were reviewed above. Constitutional, HEENT, cardiovascular, respiratory, GI, GU, musculoskeletal, neuro, psychiatric, endocrine, integumentary and hematologic systems were reviewed and are otherwise negative/unremarkable except for positive findings mentioned above in the HPI.   MEDICATIONS AT HOME:   Prior to Admission medications   Medication Sig Start Date End Date Taking? Authorizing Provider  acetaminophen  (TYLENOL ) 325 MG tablet Take 2 tablets (650 mg total) by mouth every 6 (six) hours as needed for mild pain (or Fever >/= 101). 09/07/22  Yes Krishnan, Sendil K, MD  albuterol  (VENTOLIN  HFA) 108 (90 Base) MCG/ACT inhaler Inhale into the lungs every 6 (six) hours as needed for wheezing or shortness of breath.   Yes [provider]  cyclobenzaprine  (FEXMID ) 7.5 MG tablet Take 7.5 mg by mouth 3 (three) times daily. 03/08/24  Yes [provider]  dexlansoprazole (DEXILANT) 60 MG capsule Take 60 mg by mouth daily.   Yes [provider]  divalproex  (DEPAKOTE  ER) 500 MG 24 hr tablet Take 1,000 mg by mouth in the morning and at bedtime.   Yes [provider]  donepezil  (ARICEPT  ODT) 10 MG disintegrating tablet Take 10 mg by mouth at bedtime.   Yes  [provider]  fenofibrate  160 MG tablet Take 160 mg by mouth daily. 04/25/22  Yes [provider]  ferrous sulfate  325 (65 FE) MG tablet Take 325 mg by mouth daily with breakfast.   Yes [provider]  Fluticasone -Umeclidin-Vilant (TRELEGY ELLIPTA) 100-62.5-25 MCG/ACT AEPB Inhale into the lungs daily.   Yes [provider]  furosemide (LASIX) 20 MG tablet Take 20 mg by mouth daily. 03/19/24  Yes [provider]  hydrOXYzine  (ATARAX ) 10 MG tablet Take 10 mg by mouth 3 (three) times daily as needed.   Yes [provider]  lidocaine  (LIDODERM ) 5 % Place 1 patch onto the skin daily. 04/05/24  Yes [provider]  lisinopril  (ZESTRIL ) 20 MG tablet Take 20 mg by mouth daily. 03/23/24  Yes [provider]  naloxone  (NARCAN ) nasal spray 4 mg/0.1  mL Place 1 spray into the nose once. 11/29/22  Yes [provider]  Oxycodone  HCl 20 MG TABS Take 20 mg by mouth every 4 (four) hours. 02/09/24  Yes [provider]  rosuvastatin  (CRESTOR ) 20 MG tablet Take 20 mg by mouth at bedtime. 04/08/22  Yes [provider]  tiZANidine  (ZANAFLEX ) 4 MG capsule Take 4 mg by mouth every 12 (twelve) hours.   Yes [provider]  dapagliflozin  propanediol (FARXIGA ) 10 MG TABS tablet Take 10 mg by mouth daily. Patient not taking: Reported on 04/17/2024    [provider]  lactulose  (CHRONULAC ) 10 GM/15ML solution Take 30 mLs (20 g total) by mouth 2 (two) times daily. Titrate to 1--2 bowel movements per day Patient not taking: Reported on 04/17/2024 01/01/24   Von Bellis, MD  Roflumilast  250 MCG TABS Take 1 tablet by mouth daily. Patient not taking: Reported on 04/17/2024    [provider]  traZODone  (DESYREL ) 50 MG tablet Take 0.5 tablets (25 mg total) by mouth at bedtime as needed for sleep. Patient not taking: Reported on 04/17/2024 12/24/23   Barbarann Nest, MD      VITAL SIGNS:  Blood pressure 112/67,  pulse 65, temperature 97.6 F (36.4 C), temperature source Oral, resp. rate (!) 21, SpO2 100%.  PHYSICAL EXAMINATION:  Physical Exam  GENERAL: Acutely ill 68 y.o.-year-old patient lying in the bed with no acute distress.  He was somnolent but arousable.  He later became unresponsive when blood pressure dropped to systolic in the 50s. EYES: Pupils equal, round, reactive to light and accommodation. No scleral icterus. Extraocular muscles intact.  HEENT: Head atraumatic, normocephalic. Oropharynx with dry mucous membranes and tongue and nasopharynx clear.  NECK:  Supple, no jugular venous distention. No thyroid  enlargement, no tenderness.  LUNGS: Normal breath sounds bilaterally, no wheezing, rales,rhonchi or crepitation. No use of accessory muscles of respiration.  CARDIOVASCULAR: Regular rate and rhythm, S1, S2 normal. No murmurs, rubs, or gallops.  ABDOMEN: Soft, distended, nontender. Bowel sounds present. No organomegaly or mass.  EXTREMITIES: No pedal edema, cyanosis, or clubbing.  NEUROLOGIC: Cranial nerves II through XII are intact. Muscle strength 5/5 in all extremities. Sensation intact. Gait not checked.  PSYCHIATRIC: The patient is alert and oriented x 3.  Normal affect and good eye contact. SKIN: No obvious rash, lesion, or ulcer.   LABORATORY PANEL:   CBC Recent Labs  Lab 04/16/24 2210  WBC 4.5  HGB 9.2*  HCT 29.1*  PLT 52*   ------------------------------------------------------------------------------------------------------------------  Chemistries  Recent Labs  Lab 04/16/24 2210  NA 145  K 3.3*  CL 106  CO2 28  GLUCOSE 118*  BUN 49*  CREATININE 3.49*  CALCIUM  8.6*  AST 26  ALT 10  ALKPHOS 53  BILITOT 1.6*   ------------------------------------------------------------------------------------------------------------------  Cardiac Enzymes No results for input(s): TROPONINI in the last 168  hours. ------------------------------------------------------------------------------------------------------------------  RADIOLOGY:  CT ABDOMEN PELVIS WO CONTRAST Result Date: 04/17/2024 CLINICAL DATA:  Kidney failure, acute dehydrated. Patient states that he does feel weak and that he hasn't been able to eat much in quite awhile. Patient is alert and oriented X4, but he is ; hypotensive in triage. He denies fevers, nausea, or vomiting. Endorses chronic back pain and shoulder pain. He says he has fallen several times lately. He is having trouble telling me if he has injured anything, and says he can't really remember. He ; denies taking blood thinners. EXAM: CT ABDOMEN AND PELVIS WITHOUT CONTRAST TECHNIQUE: Multidetector CT imaging of the abdomen  and pelvis was performed following the standard protocol without IV contrast. RADIATION DOSE REDUCTION: This exam was performed according to the departmental dose-optimization program which includes automated exposure control, adjustment of the mA and/or kV according to patient size and/or use of iterative reconstruction technique. COMPARISON:  CT abdomen pelvis 12/30/2023 FINDINGS: Lower chest: Aortic valve leaflet calcification. Four-vessel coronary calcification. Hepatobiliary: No focal liver abnormality. No gallstones, gallbladder wall thickening, or pericholecystic fluid. No biliary dilatation. Pancreas: Diffusely atrophic. No focal lesion. Otherwise normal pancreatic contour. No surrounding inflammatory changes. No main pancreatic ductal dilatation. Spleen: Normal in size without focal abnormality. Adrenals/Urinary Tract: No adrenal nodule bilaterally. No nephrolithiasis and no hydronephrosis. Fluid density lesions of the kidneys likely represent simple renal cysts. Simple renal cysts, in the absence of clinically indicated signs/symptoms, require no independent follow-up. No ureterolithiasis or hydroureter. The urinary bladder is unremarkable. Stomach/Bowel:  Stomach is within normal limits. No evidence of bowel wall thickening or dilatation. Status post appendectomy. Vascular/Lymphatic: No abdominal aorta or iliac aneurysm. Severe atherosclerotic plaque of the aorta and its branches. No abdominal, pelvic, or inguinal lymphadenopathy. Reproductive: Prostate is unremarkable. Other: VP shunt terminates within the right lower abdomen. Trace simple free fluid ascites. No intraperitoneal free gas. No organized fluid collection. Musculoskeletal: Fat containing umbilical hernia. Densely sclerotic lesion of the proximal right femur and left iliac bone likely bone islands. No suspicious lytic or blastic osseous lesions. No acute displaced fracture. Multilevel degenerative changes of the spine. IMPRESSION: 1. No acute intra-abdominal or intrapelvic abnormality with limited evaluation on this noncontrast study. 2. Trace simple free fluid ascites likely due to VP shunt. 3. Aortic Atherosclerosis (ICD10-I70.0) -severe, including four-vessel coronary calcification and aortic valve leaflet calcification-correlate for aortic stenosis. Electronically Signed   By: Morgane  Naveau M.D.   On: 04/17/2024 00:44   CT Head Wo Contrast Result Date: 04/17/2024 EXAM: CT HEAD AND CERVICAL SPINE 04/17/2024 12:19:25 AM TECHNIQUE: CT of the head and cervical spine was performed without the administration of intravenous contrast. Multiplanar reformatted images are provided for review. Automated exposure control, iterative reconstruction, and/or weight based adjustment of the mA/kV was utilized to reduce the radiation dose to as low as reasonably achievable. COMPARISON: None available. CLINICAL HISTORY: Head trauma, minor (Age >= 65y). Per ed notes; Patient arrives via EMS after family called this evening stating that they think the patient may be dehydrated. Patient states that he does feel weak and that he hasn't been able to eat much in quite awhile. Patient is alert and oriented X4, but he is  hypotensive in triage. He denies fevers, nausea, or vomiting. Endorses chronic back pain and shoulder pain. He says he has fallen several times lately. He is having trouble telling me if he has injured anything, and says he can't really remember. He denies taking blood thinners. FINDINGS: CT HEAD BRAIN AND VENTRICLES: No acute intracranial hemorrhage. No mass effect or midline shift. Gray-white differentiation is maintained. No hydrocephalus. Chronic encephalomalacia of the right frontal lobes unchanged. Bilateral low-density extra-axial collections are unchanged and likely hygromas. ORBITS: No acute abnormality. SINUSES AND MASTOIDS: No acute abnormality. SOFT TISSUES AND SKULL: No acute skull fracture. No acute soft tissue abnormality. Right parietal approach catheter with tip near the right foramina. VASCULATURE: Mild calcific atherosclerosis of the internal carotid arteries at the skull base and mild bilateral carotid bifurcation atherosclerosis. CT CERVICAL SPINE BONES AND ALIGNMENT: No acute fracture or traumatic malalignment. Mild vertebral body height loss at the C4-6 levels. DEGENERATIVE CHANGES: Right C3-4 facet hypertrophy and  fusion. SOFT TISSUES: No prevertebral soft tissue swelling. IMPRESSION: 1. No acute intracranial abnormality. 2. No acute fracture or traumatic malalignment of the cervical spine. Electronically signed by: Franky Stanford MD 04/17/2024 12:24 AM EDT RP Workstation: [id number]   CT Cervical Spine Wo Contrast Result Date: 04/17/2024 EXAM: CT HEAD AND CERVICAL SPINE 04/17/2024 12:19:25 AM TECHNIQUE: CT of the head and cervical spine was performed without the administration of intravenous contrast. Multiplanar reformatted images are provided for review. Automated exposure control, iterative reconstruction, and/or weight based adjustment of the mA/kV was utilized to reduce the radiation dose to as low as reasonably achievable. COMPARISON: None available. CLINICAL HISTORY: Head trauma,  minor (Age >= 65y). Per ed notes; Patient arrives via EMS after family called this evening stating that they think the patient may be dehydrated. Patient states that he does feel weak and that he hasn't been able to eat much in quite awhile. Patient is alert and oriented X4, but he is hypotensive in triage. He denies fevers, nausea, or vomiting. Endorses chronic back pain and shoulder pain. He says he has fallen several times lately. He is having trouble telling me if he has injured anything, and says he can't really remember. He denies taking blood thinners. FINDINGS: CT HEAD BRAIN AND VENTRICLES: No acute intracranial hemorrhage. No mass effect or midline shift. Gray-white differentiation is maintained. No hydrocephalus. Chronic encephalomalacia of the right frontal lobes unchanged. Bilateral low-density extra-axial collections are unchanged and likely hygromas. ORBITS: No acute abnormality. SINUSES AND MASTOIDS: No acute abnormality. SOFT TISSUES AND SKULL: No acute skull fracture. No acute soft tissue abnormality. Right parietal approach catheter with tip near the right foramina. VASCULATURE: Mild calcific atherosclerosis of the internal carotid arteries at the skull base and mild bilateral carotid bifurcation atherosclerosis. CT CERVICAL SPINE BONES AND ALIGNMENT: No acute fracture or traumatic malalignment. Mild vertebral body height loss at the C4-6 levels. DEGENERATIVE CHANGES: Right C3-4 facet hypertrophy and fusion. SOFT TISSUES: No prevertebral soft tissue swelling. IMPRESSION: 1. No acute intracranial abnormality. 2. No acute fracture or traumatic malalignment of the cervical spine. Electronically signed by: Franky Stanford MD 04/17/2024 12:24 AM EDT RP Workstation: [id number]      IMPRESSION AND PLAN:  Assessment and Plan: * Hypovolemic shock (HCC) - The patient will be admitted to an ICU bed. - We started him on IV Levophed  in the ER and that will be continued. - She became bradycardic to 39 and  was given 0.5 mg of IV atropine . - He was also given 0.4 mg of nasal Narcan . - ICU team was consulted and will assume care at the patient. - Will continue hydration with IV normal saline.  He was given a bolus of 1.5 L. - Will obtain 2 blood cultures.  Acute kidney injury superimposed on stage IIIb chronic kidney disease (HCC) - This is likely prerenal due to volume depletion and dehydration. - The patient will be hydrated with IV normal saline and will follow BMP. - Will avoid nephrotoxins.  Acute hepatic encephalopathy (HCC) - The patient was given p.o. lactulose  and will continue on it.  Dyslipidemia - Will continue statin therapy.  Chronic pain - Will hold off narcotics pending improvement of his mental status.  Lewy body dementia (HCC) - Will continue Aricept .  Seizure disorder (HCC) - Will continue Depakote  ER.   DVT prophylaxis: SCDs Advanced Care Planning:  Code Status: The patient is DNR/DNI. Family Communication:  The plan of care was discussed in details with the patient (and family). I answered  all questions. The patient agreed to proceed with the above mentioned plan. Further management will depend upon hospital course. Disposition Plan: Back to previous home environment Consults called: ICU All the records are reviewed and case discussed with ED provider.  Status is: Inpatient   At the time of the admission, it appears that the appropriate admission status for this patient is inpatient.  This is judged to be reasonable and necessary in order to provide the required intensity of service to ensure the patient's safety given the presenting symptoms, physical exam findings and initial radiographic and laboratory data in the context of comorbid conditions.  The patient requires inpatient status due to high intensity of service, high risk of further deterioration and high frequency of surveillance required.  I certify that at the time of admission, it is my clinical  judgment that the patient will require inpatient hospital care extending more than 2 midnights.                            Dispo: The patient is from: Home              Anticipated d/c is to: Home              Patient currently is not medically stable to d/c.              Difficult to place patient: No  Authorized and performed by: Madison Peaches, MD Total critical care time:   60     minutes. Due to a high probability of clinically significant, life-threatening deterioration, the patient required my highest level of preparedness to intervene emergently and I personally spent this critical care time directly and personally managing the patient.  This critical care time included obtaining a history, examining the patient, pulse oximetry, ordering and review of studies, arranging urgent treatment with development of management plan, evaluation of patient's response to treatment, frequent reassessment, and discussions with other providers. This critical care time was performed to assess and manage the high probability of imminent, life-threatening deterioration that could result in multiorgan failure.  It was exclusive of separately billable procedures and treating other patients and teaching time.   Madison DELENA Peaches M.D on 04/17/2024 at 6:26 AM  [facility]   From 7 PM-7 AM, contact night-coverage [email or web address]  CC: Primary care physician; Alla Amis, MD

## 2024-04-17 NOTE — Hospital Course (Signed)
 68 y.o. male with medical history significant for type 2 diabetes mellitus, hypertension, dyslipidemia, stage III-IV chronic kidney disease, Lewy body dementia, VP shunt, lumbar stenosis and chronic pain and seizure, who presented to the emergency room with acute onset of generalized weakness and concern for dehydration.  He has not had much appetite for a while.  He has chronic pain in the back and occasionally in the chest and has been on scheduled oxycodone .  He has been having recent recurrent falls.  No fever or chills.  No nausea or vomiting or abdominal pain.  No head injuries.  He is been having slightly diminished urine output.  No melena or bright red bleeding per rectum.  He denies any abdominal pain or diarrhea or constipation.   ED Course: When the patient came to the ER, BP was 101/63 with heart rate of 58.  Later on BP was 82/59.  Vital signs were otherwise normal.  Labs revealed mild hypokalemia of 3.3 and a BUN of 49 with a creatinine of 3.49 above previous levels of 18/1.88 on 03/08/2024.  Albumin was 2.8 and total bili 1.6 with ammonia level of 53.  High-sensitivity troponin I was 6.  CBC showed anemia with hemoglobin 9.2 hematocrit 29.1 close to baseline and platelets were low at 52 EKG as reviewed by me : EKG showed s mild sinus bradycardia with rate of 52 with ectopic atrial rhythm and Q waves inferiorly. Imaging: Noncontrasted CT scan revealed no acute ventricular abnormality.  C-spine CT showed no fracture or traumatic malalignment of the spine. Abdominal and pelvic CT scan revealed the following: 1. No acute intra-abdominal or intrapelvic abnormality with limited evaluation on this noncontrast study. 2. Trace simple free fluid ascites likely due to VP shunt. 3. Aortic Atherosclerosis (ICD10-I70.0) -severe, including four-vessel coronary calcification and aortic valve leaflet calcification-correlate for aortic stenosis.   The patient was given p.o. oxycodone  earlier as well as 500  mL IV normal saline bolus and 40 mill equivalent p.o. potassium chloride  with 20 g of p.o. lactulose .  7/19.  Patient had persistent hypotension despite 2 fluid boluses.  Started Levophed .  Spoke with critical care team about maybe doing dopamine  which will help out with heart rate.  Patient lethargic and did not move his right leg very well.  MRI of the brain ordered.  Empiric antibiotics ordered and blood cultures ordered since patient does have a VP shunt and was hypothermic.  Platelets too low for lumbar puncture.  Peripheral smear did not comment on schistocytes just commented on platelets being low.  Case discussed with critical care team.

## 2024-04-17 NOTE — Plan of Care (Signed)

## 2024-04-17 NOTE — Assessment & Plan Note (Addendum)
 Hold Aricept .  Could contribute to bradycardia.

## 2024-04-17 NOTE — Assessment & Plan Note (Addendum)
 Depakote  ER.  Depakote  level normal.

## 2024-04-17 NOTE — ED Notes (Signed)
 MD Wieting notified of MAP below 65. Pt wakes with stimulation but decreased LOC. Levophed  and NS bolus order given.

## 2024-04-17 NOTE — Assessment & Plan Note (Signed)
 Continue to monitor closely.  Platelet count too low for any procedure.  Supportive care.  Continue to monitor.

## 2024-04-17 NOTE — Assessment & Plan Note (Deleted)
 Ammonia level only borderline at 53.  Made n.p.o. with altered mental status.  Given a dose of lactulose  in the emergency room.

## 2024-04-17 NOTE — Progress Notes (Signed)
 Progress Note   Patient: Albert Figueroa FMW:[id number] DOB: [birth year] DOA: 04/16/2024     0 DOS: the patient was seen and examined on 04/17/2024   Brief hospital course: 68 y.o. male with medical history significant for type 2 diabetes mellitus, hypertension, dyslipidemia, stage III-IV chronic kidney disease, Lewy body dementia, VP shunt, lumbar stenosis and chronic pain and seizure, who presented to the emergency room with acute onset of generalized weakness and concern for dehydration.  He has not had much appetite for a while.  He has chronic pain in the back and occasionally in the chest and has been on scheduled oxycodone .  He has been having recent recurrent falls.  No fever or chills.  No nausea or vomiting or abdominal pain.  No head injuries.  He is been having slightly diminished urine output.  No melena or bright red bleeding per rectum.  He denies any abdominal pain or diarrhea or constipation.   ED Course: When the patient came to the ER, BP was 101/63 with heart rate of 58.  Later on BP was 82/59.  Vital signs were otherwise normal.  Labs revealed mild hypokalemia of 3.3 and a BUN of 49 with a creatinine of 3.49 above previous levels of 18/1.88 on 03/08/2024.  Albumin was 2.8 and total bili 1.6 with ammonia level of 53.  High-sensitivity troponin I was 6.  CBC showed anemia with hemoglobin 9.2 hematocrit 29.1 close to baseline and platelets were low at 52 EKG as reviewed by me : EKG showed s mild sinus bradycardia with rate of 52 with ectopic atrial rhythm and Q waves inferiorly. Imaging: Noncontrasted CT scan revealed no acute ventricular abnormality.  C-spine CT showed no fracture or traumatic malalignment of the spine. Abdominal and pelvic CT scan revealed the following: 1. No acute intra-abdominal or intrapelvic abnormality with limited evaluation on this noncontrast study. 2. Trace simple free fluid ascites likely due to VP shunt. 3. Aortic Atherosclerosis (ICD10-I70.0) -severe,  including four-vessel coronary calcification and aortic valve leaflet calcification-correlate for aortic stenosis.   The patient was given p.o. oxycodone  earlier as well as 500 mL IV normal saline bolus and 40 mill equivalent p.o. potassium chloride  with 20 g of p.o. lactulose .  7/19.  Patient had persistent hypotension despite 2 fluid boluses.  Started Levophed .  Spoke with critical care team about maybe doing dopamine  which will help out with heart rate.  Patient lethargic and did not move his right leg very well.  MRI of the brain ordered.  Empiric antibiotics ordered and blood cultures ordered since patient does have a VP shunt and was hypothermic.  Platelets too low for lumbar puncture.  Peripheral smear did not comment on schistocytes just commented on platelets being low.  Case discussed with critical care team.  Assessment and Plan: * Hypovolemic shock (HCC) Could be septic shock with hypothermia, hypotension, leukopenia and acute kidney injury.  IV fluid hydration and fluid boluses.  Started Levophed .  Spoke with critical care team about maybe using dopamine  to help out with heart rate.  Acute metabolic encephalopathy Could be secondary to infection or stroke.  MRI of the brain ordered.  Ampicillin  Rocephin  and vancomycin  ordered just in case infection with VP shunt.  1 dose Decadron  ordered.  Platelet count too low for procedures.  Blood cultures ordered.  Ammonia level borderline but could be seen with Depakote  also.  Given a dose of lactulose  in ER.  Sinus bradycardia Case discussed with critical care team to maybe use dopamine  to  help out with heart rate.  Given atropine  in the ER.  Pancytopenia (HCC) Continue to monitor closely.  Platelet count too low for any procedure.  Supportive care.  Continue to monitor.  Acute kidney injury superimposed on stage IIIb chronic kidney disease (HCC) IV fluid hydration  Hypernatremia Continue to monitor.  Dyslipidemia Hold  statin  Chronic pain Lower dose of pain medications if needed.  But with lethargy likely will hold off.  Right leg weakness Not sure if this is secondary to stroke or because of his altered mental status.  MRI of the brain ordered.  CT scan of the head negative.  Lewy body dementia (HCC) Hold Aricept .  Could contribute to bradycardia.  Seizure disorder (HCC) Depakote  ER.  Depakote  level normal.        Subjective: Family called EMS with altered mental status.  Son thinks it may be too much pain medication.  Came in with dehydration and had hypovolemic shock but also had low temperature and pancytopenia.  Empiric antibiotics ordered.  Blood cultures ordered  Physical Exam: Vitals:   04/17/24 1025 04/17/24 1030 04/17/24 1040 04/17/24 1105  BP: (!) 87/57 (!) 102/58 100/63 125/72  Pulse: (!) 43 (!) 41 (!) 41 66  Resp: 12 11 12 19   Temp:    97.6 F (36.4 C)  TempSrc:    Axillary  SpO2: 100% 100% 100% 99%  Height:    5' 11 (1.803 m)   Physical Exam HENT:     Head: Normocephalic.  Eyes:     General: Lids are normal.     Conjunctiva/sclera: Conjunctivae normal.  Cardiovascular:     Rate and Rhythm: Regular rhythm. Bradycardia present.     Heart sounds: Normal heart sounds, S1 normal and S2 normal.  Pulmonary:     Breath sounds: No decreased breath sounds, wheezing, rhonchi or rales.  Abdominal:     Palpations: Abdomen is soft.     Tenderness: There is no abdominal tenderness.  Musculoskeletal:     Right lower leg: No swelling.     Left lower leg: No swelling.  Skin:    General: Skin is warm.     Findings: No rash.  Neurological:     Mental Status: He is lethargic.     Comments: Unable to lift right leg up off the bed.  Able to lift left leg up off the bed.  Able to lift both arms overhead.     Data Reviewed: Sodium 148, creatinine 3.07, GFR 21, white blood cell count 3.0, hemoglobin 8.8, platelet count 34, peripheral smear shows platelets appear decreased.  Family  Communication: Updated son on the phone  Disposition: Status is: Inpatient Remains inpatient appropriate because: Patient lethargic and hypotensive.  Patient on Levophed  and critical care team to take over case and start dopamine .  Planned Discharge Destination: To be determined    Time spent: 35 minutes.  Patient is critically ill and high risk for cardiopulmonary arrest Case discussed with critical care team and nursing staff  Author: Charlie Patterson, MD 04/17/2024 11:51 AM  For on call review [email or web address].

## 2024-04-17 NOTE — ED Notes (Signed)
Dr. Mansy at bedside. 

## 2024-04-18 LAB — RENAL FUNCTION PANEL
Albumin: 2.5 g/dL — ABNORMAL LOW (ref 3.5–5.0)
Anion gap: 12 (ref 5–15)
BUN: 40 mg/dL — ABNORMAL HIGH (ref 8–23)
CO2: 22 mmol/L (ref 22–32)
Calcium: 8.5 mg/dL — ABNORMAL LOW (ref 8.9–10.3)
Chloride: 112 mmol/L — ABNORMAL HIGH (ref 98–111)
Creatinine, Ser: 2.6 mg/dL — ABNORMAL HIGH (ref 0.61–1.24)
GFR, Estimated: 26 mL/min — ABNORMAL LOW (ref >60)
Glucose, Bld: 132 mg/dL — ABNORMAL HIGH (ref 70–99)
Phosphorus: 4.3 mg/dL (ref 2.5–4.6)
Potassium: 4.3 mmol/L (ref 3.5–5.1)
Sodium: 146 mmol/L — ABNORMAL HIGH (ref 135–145)

## 2024-04-18 LAB — CBC WITH DIFFERENTIAL/PLATELET
Abs Immature Granulocytes: 0.03 K/uL (ref 0.00–0.07)
Basophils Absolute: 0 K/uL (ref 0.0–0.1)
Basophils Relative: 0 %
Eosinophils Absolute: 0 K/uL (ref 0.0–0.5)
Eosinophils Relative: 1 %
HCT: 30.6 % — ABNORMAL LOW (ref 39.0–52.0)
Hemoglobin: 9.8 g/dL — ABNORMAL LOW (ref 13.0–17.0)
Immature Granulocytes: 1 %
Lymphocytes Relative: 25 %
Lymphs Abs: 1.2 K/uL (ref 0.7–4.0)
MCH: 33.3 pg (ref 26.0–34.0)
MCHC: 32 g/dL (ref 30.0–36.0)
MCV: 104.1 fL — ABNORMAL HIGH (ref 80.0–100.0)
Monocytes Absolute: 0.5 K/uL (ref 0.1–1.0)
Monocytes Relative: 9 %
Neutro Abs: 3.2 K/uL (ref 1.7–7.7)
Neutrophils Relative %: 64 %
Platelets: 43 K/uL — ABNORMAL LOW (ref 150–400)
RBC: 2.94 MIL/uL — ABNORMAL LOW (ref 4.22–5.81)
RDW: 15 % (ref 11.5–15.5)
WBC: 4.9 K/uL (ref 4.0–10.5)
nRBC: 0 % (ref 0.0–0.2)

## 2024-04-18 LAB — ECHOCARDIOGRAM COMPLETE
AR max vel: 2.17 cm2
AV Area VTI: 2.34 cm2
AV Area mean vel: 1.87 cm2
AV Mean grad: 5 mmHg
AV Peak grad: 9.7 mmHg
Ao pk vel: 1.56 m/s
Area-P 1/2: 1.74 cm2
Height: 71 in
MV VTI: 1.4 cm2
S' Lateral: 2.6 cm
Weight: 2000.01 [oz_av]

## 2024-04-18 LAB — GLUCOSE, CAPILLARY
Glucose-Capillary: 100 mg/dL — ABNORMAL HIGH (ref 70–99)
Glucose-Capillary: 108 mg/dL — ABNORMAL HIGH (ref 70–99)
Glucose-Capillary: 126 mg/dL — ABNORMAL HIGH (ref 70–99)
Glucose-Capillary: 92 mg/dL (ref 70–99)
Glucose-Capillary: 95 mg/dL (ref 70–99)
Glucose-Capillary: 96 mg/dL (ref 70–99)

## 2024-04-18 LAB — BRAIN NATRIURETIC PEPTIDE: B Natriuretic Peptide: 420.4 pg/mL — ABNORMAL HIGH (ref 0.0–100.0)

## 2024-04-18 LAB — VANCOMYCIN, RANDOM: Vancomycin Rm: 16 ug/mL

## 2024-04-18 LAB — MAGNESIUM: Magnesium: 2.3 mg/dL (ref 1.7–2.4)

## 2024-04-18 MED ORDER — LACTATED RINGERS IV BOLUS
500.0000 mL | Freq: Once | INTRAVENOUS | Status: AC
  Administered 2024-04-18: 500 mL via INTRAVENOUS

## 2024-04-18 MED ORDER — DOPAMINE-DEXTROSE 3.2-5 MG/ML-% IV SOLN
0.0000 ug/kg/min | INTRAVENOUS | Status: DC
  Administered 2024-04-18 – 2024-04-19 (×2): 5 ug/kg/min via INTRAVENOUS
  Filled 2024-04-18: qty 250

## 2024-04-18 MED ORDER — VALPROATE SODIUM 100 MG/ML IV SOLN
500.0000 mg | Freq: Two times a day (BID) | INTRAVENOUS | Status: AC
  Administered 2024-04-18: 500 mg via INTRAVENOUS
  Filled 2024-04-18: qty 5

## 2024-04-18 MED ORDER — VANCOMYCIN HCL 750 MG/150ML IV SOLN
750.0000 mg | Freq: Once | INTRAVENOUS | Status: AC
  Administered 2024-04-18: 750 mg via INTRAVENOUS
  Filled 2024-04-18: qty 150

## 2024-04-18 MED ORDER — ATROPINE SULFATE 1 MG/10ML IJ SOSY
PREFILLED_SYRINGE | INTRAMUSCULAR | Status: AC
  Filled 2024-04-18: qty 10

## 2024-04-18 MED ORDER — SODIUM CHLORIDE 0.9 % IV SOLN
2.0000 g | INTRAVENOUS | Status: DC
  Filled 2024-04-18: qty 20

## 2024-04-18 MED ORDER — VALPROATE SODIUM 100 MG/ML IV SOLN
500.0000 mg | Freq: Four times a day (QID) | INTRAVENOUS | Status: AC
  Administered 2024-04-18 – 2024-04-21 (×11): 500 mg via INTRAVENOUS
  Filled 2024-04-18 (×13): qty 5

## 2024-04-18 MED ORDER — PANTOPRAZOLE SODIUM 40 MG IV SOLR
40.0000 mg | INTRAVENOUS | Status: DC
  Administered 2024-04-18 – 2024-04-23 (×6): 40 mg via INTRAVENOUS
  Filled 2024-04-18 (×6): qty 10

## 2024-04-18 NOTE — Plan of Care (Signed)

## 2024-04-18 NOTE — Evaluation (Signed)
 Occupational Therapy Evaluation Patient Details Name: TOMMASO CAVITT MRN: [id number] DOB: 10/19/1955 Today's Date: 04/18/2024   History of Present Illness   Pt is a 68 y.o. male who presented to the ER with acute onset of generalized weakness and concern for dehydration with recent recurrent falls. Admitted for management of  Hypovolemic shock, acute metabolic encephalopathy, and sinus bradycardia. 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  Unsure of pt status prior to admission, sounds like he may have went to rehab for a while after his last admission ~1 month ago but unclear. Gathered history from previous admissions that state he lives with family who assist with IADLs/driving. Pt was ambulatory with a RW with multiple fall history and recent L acromial fx where he was to wear a sling and be NWB.  Pt presents to acute OT demonstrating impaired ADL performance and functional mobility 2/2 weakness, ROM deficits, pain, and poor endurance. PT performed session prior to OT and pt's BP dropped with sitting EOB, therefore performed session at bed level in chair position. Pt required set up assist for grooming and Min A for UB bathing at bed level d/t LUE restrictions. Pt reports he has not been wearing the sling d/t it not being comfortable. He reports minimal pain then states his pain is 8/10 in the L shoulder and back. Attempted long sitting in bed via using RUE to pull on bed rails, but pt pushing instead and needed hand over hand assist to pull back on bed for pillow adjustment. He was unable to dial phone number for his son, OT assisted at end of session. Pt is very talkative and willing to work with therapist. Pt would benefit from skilled OT services to address noted  impairments and functional limitations to maximize safety and independence while minimizing falls risk and caregiver burden. Do anticipate the need for follow up OT services upon acute hospital DC.      If plan is discharge home, recommend the following:   A lot of help with walking and/or transfers;A lot of help with bathing/dressing/bathroom;Assistance with cooking/housework;Direct supervision/assist for financial management;Supervision due to cognitive status;Help with stairs or ramp for entrance;Direct supervision/assist for medications management;Assist for transportation     Functional Status Assessment   Patient has had a recent decline in their functional status and demonstrates the ability to make significant improvements in function in a reasonable and predictable amount of time.     Equipment Recommendations   Other (comment) (dedfer to next venue)     Recommendations for Other Services         Precautions/Restrictions   Precautions Precautions: Fall Recall of Precautions/Restrictions: Impaired Required Braces or Orthoses: Sling Restrictions Weight Bearing Restrictions Per Provider Order: Yes LUE Weight Bearing Per Provider Order: Non weight bearing Other Position/Activity Restrictions: per previous admission 1 month ago, pt with L acromial fx to be in sling and NWB?     Mobility Bed Mobility Overal bed mobility: Needs Assistance             General bed mobility comments: attempted long sitting in chair position with pt pulling on bed rail using RW to pull back off bed to adjust pillows, able to clear back but needs increased cues as he will push the bed rail vs pull  Transfers                   General transfer comment: deferred d/t low BP and orthostatic with PT prior to OT session      Balance Overall balance assessment: Needs assistance                                         ADL either performed or assessed  with clinical judgement   ADL Overall ADL's : Needs assistance/impaired     Grooming: Wash/dry face;Wash/dry hands;Bed level;Set up Grooming Details (indicate cue type and reason): bed in chair position with set up assist Upper Body Bathing: Bed level;Minimal assistance Upper Body Bathing Details (indicate cue type and reason): bed in chair position and cueing for initiation and LUE precautions-min A overall for safety                                 Vision         Perception         Praxis         Pertinent Vitals/Pain Pain Assessment Pain Assessment: Faces Faces Pain Scale: Hurts little more Pain Location: back and LUE as he still tries to use it even when advised not to Pain Descriptors / Indicators: Grimacing Pain Intervention(s): Monitored during session, Repositioned, Limited activity within patient's tolerance     Extremity/Trunk Assessment Upper Extremity Assessment Upper Extremity Assessment: LUE deficits/detail;Generalized weakness LUE Deficits / Details: recent acromial fx to be in sling and NWB per chart review from 1 month ago, pt reports still using the arm and not using the sling because it's not comfortable; constant reminders during session for limited use   Lower Extremity Assessment Lower Extremity Assessment: Generalized weakness       Communication Communication Communication: No apparent difficulties   Cognition Arousal: Alert, Lethargic Behavior During Therapy: Impulsive Cognition: History of cognitive impairments             OT - Cognition Comments: lewy body dementia with STM deficits per chart review                 Following commands: Impaired Following commands impaired: Follows one step commands inconsistently, Follows one step commands with increased time     Cueing  General Comments   Cueing Techniques: Verbal cues;Tactile cues;Visual cues  BP 82/70 at start of session and improved to 97/48 by end of  session, pt tolerated bed in chair position well; assisted pt to call his son at end of session   Exercises     Shoulder Instructions      Home Living Family/patient expects to be discharged to:: Skilled nursing facility (pt recently in STR) Living Arrangements: Alone Available Help at Discharge: Family;Available PRN/intermittently;Friend(s)                                    Prior Functioning/Environment Prior Level of Function : Independent/Modified Independent;History of Falls (last six months);Patient poor historian/Family not available             Mobility Comments: Ambulatory with RW for ADLs, household mobilization; does endorse multiple fall history. ADLs Comments: Does not drive; reports family assists with transportation; pt is a poor historian, sounds like recent decline  in function over last several months and possibly came from a rehab facility?    OT Problem List: Decreased strength;Decreased activity tolerance;Impaired balance (sitting and/or standing);Decreased safety awareness;Decreased cognition;Decreased range of motion;Pain;Impaired UE functional use   OT Treatment/Interventions: Self-care/ADL training;Therapeutic exercise;Patient/family education;Balance training;Therapeutic activities;DME and/or AE instruction      OT Goals(Current goals can be found in the care plan section)   Acute Rehab OT Goals Patient Stated Goal: improve function and BP OT Goal Formulation: With patient Time For Goal Achievement: 05/02/24 Potential to Achieve Goals: Fair ADL Goals Pt Will Perform Upper Body Bathing: with min assist;sitting Pt Will Perform Lower Body Dressing: with min assist;sit to/from stand;sitting/lateral leans Pt Will Transfer to Toilet: with min assist;stand pivot transfer;bedside commode   OT Frequency:  Min 2X/week    Co-evaluation              AM-PAC OT 6 Clicks Daily Activity     Outcome Measure Help from another person eating  meals?: None Help from another person taking care of personal grooming?: None Help from another person toileting, which includes using toliet, bedpan, or urinal?: A Lot Help from another person bathing (including washing, rinsing, drying)?: A Lot Help from another person to put on and taking off regular upper body clothing?: A Little Help from another person to put on and taking off regular lower body clothing?: A Lot 6 Click Score: 17   End of Session Nurse Communication: Mobility status  Activity Tolerance: Patient tolerated treatment well Patient left: in bed;with call bell/phone within reach;with bed alarm set  OT Visit Diagnosis: Other abnormalities of gait and mobility (R26.89);Muscle weakness (generalized) (M62.81)                Time: 8547-8484 OT Time Calculation (min): 23 min Charges:  OT General Charges $OT Visit: 1 Visit OT Evaluation $OT Eval Moderate Complexity: 1 Mod OT Treatments $Self Care/Home Management : 8-22 mins Caliegh Middlekauff, OTR/L 04/18/24, 5:30 PM  Idara Woodside E Josie Burleigh 04/18/2024, 5:29 PM

## 2024-04-18 NOTE — Evaluation (Signed)
 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, spinal meningitis   Hyperlipidemia    Hypertension    Kidney disease    stage 3   Lewy body dementia (HCC)    short term memory issues only   Lumbar stenosis    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   Osteoarthrosis    Seizure (HCC)    none since 2009   Stage 4 chronic kidney disease (HCC)    Tremor of both hands    Past Surgical History:  Past Surgical History:  Procedure Laterality Date   CATARACT EXTRACTION W/PHACO Right 05/14/2023   Procedure: CATARACT EXTRACTION PHACO AND INTRAOCULAR LENS PLACEMENT (IOC) RIGHT DIABETIC;  Surgeon: Mittie Gaskin, MD;  Location: [facility];  Service: Ophthalmology;  Laterality: Right;  6.70 0:39.8   CATARACT EXTRACTION W/PHACO Left 05/28/2023   Procedure: CATARACT EXTRACTION PHACO AND INTRAOCULAR LENS PLACEMENT (IOC) LEFT DIABETIC 6.34 00:37.1;  Surgeon: Mittie Gaskin, MD;  Location: [facility];  Service: Ophthalmology;  Laterality: Left;   CSF SHUNT     placed in 1980s   KNEE SURGERY     multiple surgeries on both knees   HPI:  Patient is a 68 y.o. male with past medical history noted for DM II, HTN, dyslipidemia, CKD stage III-IV, Lewy body dementia, VP shunt, lumbar stenosis, chronic pain and seizures, who presented 04/16/24 with acute onset of generalized weakness and concern for dehydration. MRI brain was negative for acute findings. Did not pass swallow screen due to inability to follow commands to  take continuous consecutive swallows. Seen during prior admissions by SLP with recommendations for dysphagia 3/thin liquids.    Assessment / Plan / Recommendation  Clinical Impression   Patient presents with grossly functional oropharyngeal swallowing given edentulous status and appears to be at functional baseline as established in SLP notes from prior admissions. He was alert and able to follow commands, though distractible (Lewy body dementia and cognitive impairments at baseline). No focal weakness or neuromuscular deficits observed. He was able to self-feed thin liquids by cup and straw, puree by teaspoon, and soft solid. Mastication, bolus formation, and oral transit timing appear functional considering absence of teeth. Swallow initiation appears timely, and pt appears to have adequate airway protection with no overt signs of aspiration observed with any consistency. Vital signs remained stable with Sp02 in upper 90s, HR low 70s, respiratory rate stable (17-18) as he consumed POs. Patient remains at mild risk for aspiration given cognitive impairment, therefore recommend general aspiration precautions including upright positioning during and for 30 minutes after meals, assist with cutting and moistening meats as needed for ease of mastication, encourage slow rate, small bites and sips and limit distractions during mealtimes. No further ST indicated as pt appears at functional baseline. SLP to s/o.   SLP Visit Diagnosis: Dysphagia, unspecified (R13.10)    Aspiration Risk  Mild aspiration risk    Diet Recommendation Dysphagia 3 (Mech soft);Thin liquid    Liquid Administration  via: Cup;Straw Medication Administration: Whole meds with liquid Supervision: Patient able to self feed Compensations: Minimize environmental distractions;Slow rate;Small sips/bites Postural Changes: Seated upright at 90 degrees    Other  Recommendations Oral Care Recommendations: Oral care BID     Assistance  Recommended at Discharge    Functional Status Assessment    Frequency and Duration            Prognosis Barriers to Reach Goals: Cognitive deficits      Swallow Study   General Date of Onset: 04/16/24 HPI: Patient is a 68 y.o. male with past medical history noted for DM II, HTN, dyslipidemia, CKD stage III-IV, Lewy body dementia, VP shunt, lumbar stenosis, chronic pain and seizures, who presented 04/16/24 with acute onset of generalized weakness and concern for dehydration. MRI brain was negative for acute findings. Did not pass swallow screen due to inability to follow commands to take continuous consecutive swallows. Seen during prior admissions by SLP with recommendations for dysphagia 3/thin liquids. Type of Study: Bedside Swallow Evaluation Previous Swallow Assessment: see HPI Diet Prior to this Study: NPO Temperature Spikes Noted: No Respiratory Status: Room air History of Recent Intubation: No Behavior/Cognition: Alert;Cooperative;Distractible Oral Cavity Assessment: Within Functional Limits Oral Care Completed by SLP: No Oral Cavity - Dentition: Edentulous Vision: Functional for self-feeding Self-Feeding Abilities: Able to feed self Patient Positioning: Upright in bed Baseline Vocal Quality: Normal Volitional Cough: Strong Volitional Swallow: Able to elicit    Oral/Motor/Sensory Function Overall Oral Motor/Sensory Function: Within functional limits   Ice Chips Ice chips: Not tested   Thin Liquid Thin Liquid: Within functional limits Presentation: Cup;Straw;Self Fed    Nectar Thick Nectar Thick Liquid: Not tested   Honey Thick Honey Thick Liquid: Not tested   Puree Puree: Within functional limits Presentation: Self Fed;Spoon   Solid     Solid: Within functional limits Presentation: Self Fed     Albert Landry [place], MS, [employer] Speech-Language Pathologist Office: [phone] ASCOM: [phone]  Albert FORBES Figueroa 04/18/2024,9:22 AM

## 2024-04-18 NOTE — Progress Notes (Signed)
 Updated pts daughter [name] via telephone regarding pts condition and current plan of care.  All questions were answered.  Will continue to monitor and assess pt  Albert Figueroa, [facility]  Pulmonary/Critical Care Pager [phone] (please enter 7 digits) PCCM Consult Pager [phone] (please enter 7 digits)

## 2024-04-18 NOTE — Plan of Care (Signed)

## 2024-04-18 NOTE — Consult Note (Signed)
 Pharmacy Antibiotic Note  Albert Figueroa is a 68 y.o. male admitted on 04/16/2024 with questionable meningitis.  Pharmacy has been consulted for vancomycin  dosing.   Plan: Continue ceftriaxone  2 g q12h Continue ampicillin  2 g q8h Give vancomycin  750 mg IV x 1 today based on a random level of 16. Consider starting maintenance regimen 7/21 if therapy continued. Scr appears nearing baseline  Height: 5' 11 (180.3 cm) Weight: 56.7 kg (125 lb) IBW/kg (Calculated) : 75.3  Temp (24hrs), Avg:98.1 F (36.7 C), Min:97.6 F (36.4 C), Max:98.4 F (36.9 C)  Recent Labs  Lab 04/16/24 2210 04/17/24 0633 04/17/24 1141 04/17/24 1354 04/18/24 0433 04/18/24 0738  WBC 4.5 3.1*  3.0*  --   --  4.9  --   CREATININE 3.49* 3.07*  --   --  2.60*  --   LATICACIDVEN  --   --  1.5 1.2  --   --   VANCORANDOM  --   --   --   --   --  16    Estimated Creatinine Clearance: 22.1 mL/min (A) (by C-G formula based on SCr of 2.6 mg/dL (H)).    Allergies  Allergen Reactions   Aricept  [Donepezil ] Other (See Comments)    SEVERE BRADYCARDIA WITH HEART BLOCK DO NOT RESTART AT DISCHARGE   Gabapentin  Other (See Comments)    Lethargic, Confused   Amlodipine     Other reaction(s): Other (see comments) Caused elevated blood pressure and headaches   Ozempic (0.25 Or 0.5 Mg-Dose) [Semaglutide(0.25 Or 0.5mg -Dos)]     Lethargy.  Also dropped blood sugars too low    Antimicrobials this admission: 7/19 vancomycin  >>  7/19 ampicillin  >>  7/19 ceftriaxone  >>   Dose adjustments this admission: None  Microbiology results: 7/19 BCx: pending  Thank you for allowing pharmacy to be a part of this patient's care.  Marolyn KATHEE Mare 04/18/2024 9:03 AM

## 2024-04-18 NOTE — Evaluation (Signed)
 Physical Therapy Evaluation Patient Details Name: LENDELL GALLICK MRN: [id number] DOB: 1955/11/12 Today's Date: 04/18/2024  History of Present Illness  Pt is a 68 y.o. male who presented to the ER with acute onset of generalized weakness and concern for dehydration with recent recurrent falls. Admitted for management of  Hypovolemic shock, acute metabolic encephalopathy, and sinus bradycardia. 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  Inconsistently follows simple, verbal commands, often requiring redirection to task/request at hand.  Patient generally impulsive with all movement; unaware of deficits or safety needs (including previous/recent L UE restrictions due to recent acromial fracture).  Endorses generalized pain in L shoulder, back, FACES 4/10; improved with repositioning. Currently requiring mod assist for bed mobility; close sup for unsupported, static sitting.  Min/mod assist for lateral scooting (due to limited comprehension of movement pattern).  Did spontaneously initiate a single sit/stand, but unable to release UE grasp from edge of bed/therapist, unable to initiate any lateral stepping edge of bed. Mild reports of dizziness with upright positioning (when prompted by therapist, does not initiate verbalizing indep); noted hypotension, orthostasis (see vitals flowsheet for details) requiring return to supine for recovery.  Positioned in chair position end of session for comfort and progressive tolerance to upright positioning.  Tolerating well; needs in reach.  Will continue to assess/progress mobility in subsequent sessions as appropriate. Would benefit from skilled PT to address above deficits and  promote optimal return to PLOF.; recommend post-acute PT follow up as indicated by interdisciplinary care team.   .        If plan is discharge home, recommend the following: A lot of help with walking and/or transfers;A lot of help with bathing/dressing/bathroom   Can travel by private vehicle        Equipment Recommendations    Recommendations for Other Services       Functional Status Assessment Patient has had a recent decline in their functional status and demonstrates the ability to make significant improvements in function in a reasonable and predictable amount of time.     Precautions / Restrictions Precautions Precautions: Fall Restrictions LUE Weight Bearing Per Provider Order: Non weight bearing Other Position/Activity Restrictions: per previous admission 1 month ago, pt with L acromial fx to be in sling and NWB?      Mobility  Bed Mobility Overal bed mobility: Needs Assistance Bed Mobility: Supine to Sit, Sit to Supine     Supine to sit: Mod assist Sit to supine: Min assist   General bed mobility comments: significant difficulty sequencing/coordinating movement transition; maintains static sitting with close sup once upright, but notable drop in BP with transition to upright (necessitating return to supine for recovery)    Transfers                        Ambulation/Gait                  Stairs            Wheelchair Mobility     Tilt Bed    Modified Rankin (Stroke Patients Only)       Balance Overall balance assessment: Needs assistance Sitting-balance support: No upper extremity supported, Feet supported Sitting balance-Leahy Scale: Good  Sitting balance - Comments: unsupported sitting, close sup, once upright and accommodated to position       Standing balance comment: deferred due to orthostasis                             Pertinent Vitals/Pain Pain Assessment Pain Assessment: Faces Faces Pain Scale:  Hurts even more Pain Location: back and LUE as he still tries to use it even when advised not to Pain Descriptors / Indicators: Grimacing Pain Intervention(s): Limited activity within patient's tolerance, Monitored during session, Repositioned    Home Living Family/patient expects to be discharged to:: Private residence Living Arrangements: Alone Available Help at Discharge: Family;Available PRN/intermittently;Friend(s)               Additional Comments: Patient poor historian; will verify social history with family as available    Prior Function Prior Level of Function : Independent/Modified Independent;History of Falls (last six months);Patient poor historian/Family not available             Mobility Comments: Ambulatory with RW for ADLs, household mobilization; does endorse multiple fall history. ADLs Comments: Does not drive; reports family assists with transportation; pt is a poor historian, sounds like recent decline in function over last several months and possibly came from a rehab facility?     Extremity/Trunk Assessment   Upper Extremity Assessment Upper Extremity Assessment:  (R UE grossly WFL; L UE (shoulder not tested due to recent acromial fracture), elbow and wrist appear grossly WFL) LUE Deficits / Details: recent acromial fx to be in sling and NWB per chart review from 1 month ago, pt reports still using the arm and not using the sling because it's not comfortable; constant reminders during session for limited use    Lower Extremity Assessment Lower Extremity Assessment: Generalized weakness (grossly at least 4/5 throughout)       Communication   Communication Communication: No apparent difficulties    Cognition Arousal: Alert Behavior During Therapy: Impulsive   PT - Cognitive impairments: No family/caregiver present to determine baseline                       PT - Cognition Comments: Oriented to self, location as hospital, year; follows  simple commands, but does require frequent redirection to task.  Impulsive, poor insight into deficits, safety needs, need for assist Following commands: Impaired Following commands impaired: Follows one step commands inconsistently     Cueing Cueing Techniques: Verbal cues, Tactile cues, Visual cues     General Comments General comments (skin integrity, edema, etc.): BP 82/70 at start of session and improved to 97/48 by end of session, pt tolerated bed in chair position well; assisted pt to call his son at end of session    Exercises Other Exercises Other Exercises: Lateral scooting edge of bed, min/mod assist to problem-solve and initiate movement.  Did spontaneously stand x1, but unable to release UE support from EOB/therapist of effectively sidestep along edge of bed.   Assessment/Plan    PT Assessment Patient needs continued PT services  PT Problem List Decreased strength;Decreased range of motion;Decreased activity tolerance;Decreased balance;Decreased mobility;Decreased cognition;Decreased knowledge of use of DME;Decreased safety awareness;Decreased knowledge of precautions;Cardiopulmonary status limiting activity;Pain       PT Treatment Interventions DME instruction;Gait training;Stair training;Functional mobility training;Balance training;Therapeutic exercise;Cognitive remediation;Therapeutic activities;Patient/family education    PT Goals (Current goals can be found in the Care Plan section)  Acute Rehab PT  Goals PT Goal Formulation: Patient unable to participate in goal setting Time For Goal Achievement: 05/02/24 Potential to Achieve Goals: Fair    Frequency Min 2X/week     Co-evaluation               AM-PAC PT 6 Clicks Mobility  Outcome Measure Help needed turning from your back to your side while in a flat bed without using bedrails?: A Lot Help needed moving from lying on your back to sitting on the side of a flat bed without using bedrails?: A Lot Help  needed moving to and from a bed to a chair (including a wheelchair)?: A Lot Help needed standing up from a chair using your arms (e.g., wheelchair or bedside chair)?: A Lot Help needed to walk in hospital room?: Total Help needed climbing 3-5 steps with a railing? : Total 6 Click Score: 10    End of Session   Activity Tolerance: Treatment limited secondary to medical complications (Comment) (hypotension, orthostasis) Patient left: with call bell/phone within reach;with bed alarm set (chair position in bed)   PT Visit Diagnosis: Difficulty in walking, not elsewhere classified (R26.2);Muscle weakness (generalized) (M62.81)    Time: 1345-1410 PT Time Calculation (min) (ACUTE ONLY): 25 min   Charges:   PT Evaluation $PT Eval Moderate Complexity: 1 Mod   PT General Charges $$ ACUTE PT VISIT: 1 Visit        Darielys Giglia H. Delores, PT, DPT, NCS 04/18/24, 8:53 PM [phone]

## 2024-04-18 NOTE — Progress Notes (Signed)
 NAME:  Albert Figueroa, MRN:  [id number], DOB:  10/07/1955, LOS: 1 ADMISSION DATE:  04/16/2024, CONSULTATION DATE: 04/17/2024 REFERRING MD: Dr. Josette, CHIEF COMPLAINT: Hypotension    History of Present Illness:  Albert Figueroa is a 68 y.o. 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 ER on 07/18 with poor po intake and recurrent falls.  Pt previously hospitalized 04/17 to 04/21 with hypotension/sinus bradycardia attributed to AKI, and opiate overdose requiring narcan  infusion.    ED Course  Upon arrival to the ER CT Head/Cervical Spine negative for acute intracranial abnormality and acute fracture/traumatic malalignment of the cervical spine.  Significant lab results were: K+ 3.3/glucose 118/BUN 49/creatinine 3.49/calcium  8.6/albumin 2.8/total bilirubin 1.6/hgb 9.2/platelet count 52.  CT Abd/Pelvis revealed no acute intra-abdominal or intrapelvic abnormality, but revealed trace simple free fluid ascites likely due to VP shunt.  While in the ER pt received 1L NS bolus and 20 g of lactulose .  Pt initially admitted to the stepdown unit per hospitalist team but remained in the ER pending bed availability.  However, while holding in the ER pt c/o pain and received scheduled 20 mg oxycodone  ir.  He became altered, hypotensive, and bradycardic with pinpoint pupils.  He received narcan , atropine , and briefly started on levophed  gtt.  PCCM team consulted, however hypotension resolved and levophed  gtt stopped PCCM teamed signed off.  Pt later became hypotensive again with recurrent sinus bradycardia despite aggressive iv fluid resuscitation requiring levophed  gtt.  PCCM team reconsulted.    04/18/24- patient off vasopressors and ionotropes today.  Thrombocytopenia possible due to anti epileptic therapy.  Follows commands but is with encephalopathy.   Pertinent   Medical History  Arthritis  Type II Diabetes Mellitus  Emphysema/COPD  Epilepsy HLD HTN CKD Stage IV Lewy Body Dementia  Lumbar Stenosis  Spinal Meningitis  CSF Shunt  Seizure Tremors in both Hands  Anti-infectives (From admission, onward)    Start     Dose/Rate Route Frequency Ordered Stop   04/18/24 1000  vancomycin  (VANCOREADY) IVPB 750 mg/150 mL        750 mg 150 mL/hr over 60 Minutes Intravenous  Once 04/18/24 0859     04/17/24 1000  cefTRIAXone  (ROCEPHIN ) 2 g in sodium chloride  0.9 % 100 mL IVPB        2 g 200 mL/hr over 30 Minutes Intravenous Every 12 hours 04/17/24 0803     04/17/24 0900  vancomycin  (VANCOREADY) IVPB 2000 mg/400 mL        2,000 mg 200 mL/hr over 120 Minutes Intravenous  Once 04/17/24 0816 04/17/24 1123   04/17/24 9177  vancomycin  variable dose per unstable renal function (pharmacist dosing)         Does not apply See admin instructions 04/17/24 0822     04/17/24 0815  ampicillin  (OMNIPEN) 2 g in sodium chloride  0.9 % 100 mL IVPB        2 g 300 mL/hr over 20 Minutes Intravenous Every 8 hours 04/17/24 0803        Significant Hospital Events: Including procedures, antibiotic start and stop dates in addition to other pertinent events   07/18: Pt admitted to the stepdown unit with hypovolemic shock and acute toxic metabolic encephalopathy, however he remained in the ICU pending bed availability   07/19: Pt developed recurrent hypotension, sinus bradycardia, and worsening acute toxic metabolic  encephalopathy requiring levophed  gtt.  Pt transferred to ICU and PCCM team consulted   Interim History / Subjective:  Pt lethargic oriented to self only but following commands.  Levophed  gtt @4mcg /min to maintain map 65 or higher.  Sinus bradycardic on telemetry monitor hr 42 to 60's  Objective    Blood pressure 116/71, pulse 70, temperature 98.3 F (36.8 C), temperature source Oral, resp. rate 13, height 5' 11 (1.803 m), weight 56.7 kg, SpO2 99%.         Intake/Output Summary (Last 24 hours) at 04/18/2024 0913 Last data filed at 04/18/2024 0900 Gross per 24 hour  Intake 4923.27 ml  Output 2500 ml  Net 2423.27 ml   Filed Weights   04/17/24 1105  Weight: 56.7 kg    Examination: General: Acute on chronically-ill appearing male, NAD on RA  HENT: Supple, no JVD  Lungs: Diminished throughout, even, non labored Cardiovascular: Sinus bradycardia, s1s2, no m/r/g, 2+ radial/1+ distal pulses, 1+ RLE edema  Abdomen: Hypoactive BS x4, soft, non distended, non tender  Extremities: Moves all extremities  Neuro: Lethargic oriented to self only, follows commands, PERRLA GU: External catheter in place    Assessment and Plan   #Acute toxic metabolic encephalopathy  #Seizure disorder #Chronic pain  #Recurrent falls  Hx: Lewy body dementia, meningitis s/p VP shunt, and epilepsy  - Correct metabolic derangements  - Avoid sedating medications as able  - Urine drug screen/ethanol level/salicylate level pending  - Seizure/Fall precautions - Continue lactulose   - MRI Brain pending  - Neurology consulted appreciate input  - Once able to tolerate po's resume depakote , aricept , and zanaflex  - Consider reducing outpatient oxycodone  dose once able to resume po's  - Will need PT/OT  #Hypotension: multifactorial hypovolemia and possible medication induced  #Sinus bradycardia  Hx: HLD and HTN  - Continuous telemetry monitoring  - Prn levophed  or dopamine  gtt to maintain map 65 or higher  - Troponin negative  - Hold outpatient diuretics and antihypertensives for now  - Resume outpatient rosuvastatin  once able to take po's  - Thyroid  panel pending  - Echo pending  - Will r/o sepsis   #Emphysema/COPD without exacerbation  - Supplemental O2 for dyspnea and/or hypoxia  - Maintain O2 sats 88% to 92% - Scheduled and prn bronchodilator therapy  - Intermittent CXR   #Acute kidney injury superimposed on CKD stage IV #Hypernatremia  - Trend CMP  -  Replace electrolytes as indicated  - Strict I&O's - Avoid nephrotoxic agents as able   #Leukopenia  - Trend WBC and monitor fever curve - Follow cultures  - Continue empiric abx as outlined above due to concern for possible sepsis pending culture results and sensitivities   #Acute on chronic thrombocytopenia  #Macrocytic anemia~no signs of active bleeding  - Trend CBC  - Monitor for s/sx of bleeding  - Transfuse for hgb <7 and/or platelet count <20,000 - Folic acid  and vitamin B12 levels pending   #Type II diabetes mellitus  - CBG's q4hrs  - Target CBG readings 140 to 180 - Follow hypo/hyperglycemic protocol   Best Practice (right click and Reselect all SmartList Selections daily)   Diet/type: NPO DVT prophylaxis SCD Pressure ulcer(s): N/A GI prophylaxis: PPI Lines: N/A Foley:  N/A Code Status:  DNR Last date of multidisciplinary goals of care discussion [N/A]  Labs   CBC: Recent Labs  Lab 04/16/24 2210 04/17/24 0633 04/18/24 0433  WBC 4.5 3.1*  3.0* 4.9  NEUTROABS  --  1.3* 3.2  HGB  9.2* 8.9*  8.8* 9.8*  HCT 29.1* 27.0*  27.5* 30.6*  MCV 106.6* 104.2*  106.2* 104.1*  PLT 52* 35*  34* 43*    Basic Metabolic Panel: Recent Labs  Lab 04/16/24 2210 04/17/24 0633 04/18/24 0433  NA 145 148* 146*  K 3.3* 3.6 4.3  CL 106 112* 112*  CO2 28 27 22   GLUCOSE 118* 88 132*  BUN 49* 47* 40*  CREATININE 3.49* 3.07* 2.60*  CALCIUM  8.6* 7.9* 8.5*  MG  --   --  2.3  PHOS  --   --  4.3   GFR: Estimated Creatinine Clearance: 22.1 mL/min (A) (by C-G formula based on SCr of 2.6 mg/dL (H)). Recent Labs  Lab 04/16/24 2210 04/17/24 0633 04/17/24 1141 04/17/24 1354 04/18/24 0433  WBC 4.5 3.1*  3.0*  --   --  4.9  LATICACIDVEN  --   --  1.5 1.2  --     Liver Function Tests: Recent Labs  Lab 04/16/24 2210 04/18/24 0433  AST 26  --   ALT 10  --   ALKPHOS 53  --   BILITOT 1.6*  --   PROT 6.9  --   ALBUMIN 2.8* 2.5*   No results for input(s): LIPASE,  AMYLASE in the last 168 hours. Recent Labs  Lab 04/16/24 2354  AMMONIA 53*    ABG    Component Value Date/Time   HCO3 30.4 (H) 07/05/2022 1405   O2SAT 89.6 07/05/2022 1405     Coagulation Profile: No results for input(s): INR, PROTIME in the last 168 hours.  Cardiac Enzymes: Recent Labs  Lab 04/17/24 1141  CKTOTAL 174    HbA1C: Hgb A1c MFr Bld  Date/Time Value Ref Range Status  01/16/2024 05:28 AM 4.2 (L) 4.8 - 5.6 % Final    Comment:    (NOTE) Pre diabetes:          5.7%-6.4%  Diabetes:              >6.4%  Glycemic control for   <7.0% adults with diabetes   09/01/2022 06:41 PM 5.0 4.8 - 5.6 % Final    Comment:    (NOTE)         Prediabetes: 5.7 - 6.4         Diabetes: >6.4         Glycemic control for adults with diabetes: <7.0     CBG: Recent Labs  Lab 04/17/24 1629 04/17/24 1951 04/17/24 2347 04/18/24 0356 04/18/24 0750  GLUCAP 130* 127* 100* 95 96    Review of Systems:   Unable to assess pt confused   Past Medical History:  He,  has a past medical history of Arthritis, Diabetes mellitus without complication (HCC), Emphysema/COPD (HCC), Epilepsy (HCC), History of brain shunt, Hyperlipidemia, Hypertension, Kidney disease, Lewy body dementia (HCC), Lumbar stenosis, Meningitis spinal, Osteoarthrosis, Seizure (HCC), Stage 4 chronic kidney disease (HCC), and Tremor of both hands.   Surgical History:   Past Surgical History:  Procedure Laterality Date   CATARACT EXTRACTION W/PHACO Right 05/14/2023   Procedure: CATARACT EXTRACTION PHACO AND INTRAOCULAR LENS PLACEMENT (IOC) RIGHT DIABETIC;  Surgeon: Mittie Gaskin, MD;  Location: [facility];  Service: Ophthalmology;  Laterality: Right;  6.70 0:39.8   CATARACT EXTRACTION W/PHACO Left 05/28/2023   Procedure: CATARACT EXTRACTION PHACO AND INTRAOCULAR LENS PLACEMENT (IOC) LEFT DIABETIC 6.34 00:37.1;  Surgeon: Mittie Gaskin, MD;  Location: [facility];  Service:  Ophthalmology;  Laterality: Left;   CSF SHUNT  placed in 1980s   KNEE SURGERY     multiple surgeries on both knees     Social History:   reports that he has been smoking cigarettes. He has a 96 pack-year smoking history. He has never used smokeless tobacco. He reports that he does not drink alcohol and does not use drugs.   Family History:  His family history includes CAD in his father; Cancer in his mother; Diabetes in his mother.   Allergies Allergies  Allergen Reactions   Aricept  [Donepezil ] Other (See Comments)    SEVERE BRADYCARDIA WITH HEART BLOCK DO NOT RESTART AT DISCHARGE   Gabapentin  Other (See Comments)    Lethargic, Confused   Amlodipine     Other reaction(s): Other (see comments) Caused elevated blood pressure and headaches   Ozempic (0.25 Or 0.5 Mg-Dose) [Semaglutide(0.25 Or 0.5mg -Dos)]     Lethargy.  Also dropped blood sugars too low     Home Medications  Prior to Admission medications   Medication Sig Start Date End Date Taking? Authorizing Provider  acetaminophen  (TYLENOL ) 325 MG tablet Take 2 tablets (650 mg total) by mouth every 6 (six) hours as needed for mild pain (or Fever >/= 101). 09/07/22  Yes Krishnan, Sendil K, MD  albuterol  (VENTOLIN  HFA) 108 (90 Base) MCG/ACT inhaler Inhale into the lungs every 6 (six) hours as needed for wheezing or shortness of breath.   Yes [provider]  cyclobenzaprine  (FEXMID ) 7.5 MG tablet Take 7.5 mg by mouth 3 (three) times daily. 03/08/24  Yes [provider]  dexlansoprazole (DEXILANT) 60 MG capsule Take 60 mg by mouth daily.   Yes [provider]  divalproex  (DEPAKOTE  ER) 500 MG 24 hr tablet Take 1,000 mg by mouth in the morning and at bedtime.   Yes [provider]  donepezil  (ARICEPT  ODT) 10 MG disintegrating tablet Take 10 mg by mouth at bedtime.   Yes [provider]  fenofibrate  160 MG tablet Take 160 mg by mouth daily. 04/25/22  Yes [provider]  ferrous  sulfate 325 (65 FE) MG tablet Take 325 mg by mouth daily with breakfast.   Yes [provider]  Fluticasone -Umeclidin-Vilant (TRELEGY ELLIPTA) 100-62.5-25 MCG/ACT AEPB Inhale into the lungs daily.   Yes [provider]  furosemide (LASIX) 20 MG tablet Take 20 mg by mouth daily. 03/19/24  Yes [provider]  hydrOXYzine  (ATARAX ) 10 MG tablet Take 10 mg by mouth 3 (three) times daily as needed.   Yes [provider]  lidocaine  (LIDODERM ) 5 % Place 1 patch onto the skin daily. 04/05/24  Yes [provider]  lisinopril  (ZESTRIL ) 20 MG tablet Take 20 mg by mouth daily. 03/23/24  Yes [provider]  naloxone  (NARCAN ) nasal spray 4 mg/0.1 mL Place 1 spray into the nose once. 11/29/22  Yes [provider]  Oxycodone  HCl 20 MG TABS Take 20 mg by mouth every 4 (four) hours. 02/09/24  Yes [provider]  rosuvastatin  (CRESTOR ) 20 MG tablet Take 20 mg by mouth at bedtime. 04/08/22  Yes [provider]  tiZANidine  (ZANAFLEX ) 4 MG capsule Take 4 mg by mouth every 12 (twelve) hours.   Yes [provider]  dapagliflozin  propanediol (FARXIGA ) 10 MG TABS tablet Take 10 mg by mouth daily. Patient not taking: Reported on 04/17/2024    [provider]  lactulose  (CHRONULAC ) 10 GM/15ML solution Take 30 mLs (20 g total) by mouth 2 (two) times daily. Titrate to 1--2 bowel movements per day Patient not taking:  Reported on 04/17/2024 01/01/24   Von Bellis, MD  Roflumilast  250 MCG TABS Take 1 tablet by mouth daily. Patient not taking: Reported on 04/17/2024    [provider]  traZODone  (DESYREL ) 50 MG tablet Take 0.5 tablets (25 mg total) by mouth at bedtime as needed for sleep. Patient not taking: Reported on 04/17/2024 12/24/23   Barbarann Nest, MD     Critical care provider statement:   Total critical care time: 33 minutes   Performed by: Parris MD   Critical care time was exclusive of separately billable  procedures and treating other patients.   Critical care was necessary to treat or prevent imminent or life-threatening deterioration.   Critical care was time spent personally by me on the following activities: development of treatment plan with patient and/or surrogate as well as nursing, discussions with consultants, evaluation of patient's response to treatment, examination of patient, obtaining history from patient or surrogate, ordering and performing treatments and interventions, ordering and review of laboratory studies, ordering and review of radiographic studies, pulse oximetry and re-evaluation of patient's condition.    Tasmine Hipwell, M.D.  Pulmonary & Critical Care Medicine

## 2024-04-19 ENCOUNTER — Inpatient Hospital Stay

## 2024-04-19 DIAGNOSIS — I959 Hypotension, unspecified: Secondary | ICD-10-CM | POA: Diagnosis not present

## 2024-04-19 DIAGNOSIS — R001 Bradycardia, unspecified: Secondary | ICD-10-CM | POA: Diagnosis not present

## 2024-04-19 DIAGNOSIS — G9341 Metabolic encephalopathy: Secondary | ICD-10-CM

## 2024-04-19 LAB — CORTISOL: Cortisol, Plasma: 9.3 ug/dL

## 2024-04-19 LAB — CBC WITH DIFFERENTIAL/PLATELET
Abs Immature Granulocytes: 0.02 K/uL (ref 0.00–0.07)
Basophils Absolute: 0 K/uL (ref 0.0–0.1)
Basophils Relative: 1 %
Eosinophils Absolute: 0.1 K/uL (ref 0.0–0.5)
Eosinophils Relative: 4 %
HCT: 26.5 % — ABNORMAL LOW (ref 39.0–52.0)
Hemoglobin: 8.7 g/dL — ABNORMAL LOW (ref 13.0–17.0)
Immature Granulocytes: 1 %
Lymphocytes Relative: 45 %
Lymphs Abs: 1.2 K/uL (ref 0.7–4.0)
MCH: 34.1 pg — ABNORMAL HIGH (ref 26.0–34.0)
MCHC: 32.8 g/dL (ref 30.0–36.0)
MCV: 103.9 fL — ABNORMAL HIGH (ref 80.0–100.0)
Monocytes Absolute: 0.2 K/uL (ref 0.1–1.0)
Monocytes Relative: 8 %
Neutro Abs: 1.1 K/uL — ABNORMAL LOW (ref 1.7–7.7)
Neutrophils Relative %: 41 %
Platelets: 29 K/uL — CL (ref 150–400)
RBC: 2.55 MIL/uL — ABNORMAL LOW (ref 4.22–5.81)
RDW: 14.9 % (ref 11.5–15.5)
WBC: 2.6 K/uL — ABNORMAL LOW (ref 4.0–10.5)
nRBC: 0 % (ref 0.0–0.2)

## 2024-04-19 LAB — FOLATE RBC
Folate, RBC: UNDETERMINED ng/mL
Hematocrit: 28.1 % — ABNORMAL LOW (ref 37.5–51.0)

## 2024-04-19 LAB — GLUCOSE, CAPILLARY
Glucose-Capillary: 89 mg/dL (ref 70–99)
Glucose-Capillary: 93 mg/dL (ref 70–99)
Glucose-Capillary: 79 mg/dL (ref 70–99)
Glucose-Capillary: 115 mg/dL — ABNORMAL HIGH (ref 70–99)
Glucose-Capillary: 108 mg/dL — ABNORMAL HIGH (ref 70–99)
Glucose-Capillary: 151 mg/dL — ABNORMAL HIGH (ref 70–99)
Glucose-Capillary: 143 mg/dL — ABNORMAL HIGH (ref 70–99)

## 2024-04-19 LAB — RENAL FUNCTION PANEL
Albumin: 2.1 g/dL — ABNORMAL LOW (ref 3.5–5.0)
Anion gap: 9 (ref 5–15)
BUN: 39 mg/dL — ABNORMAL HIGH (ref 8–23)
CO2: 26 mmol/L (ref 22–32)
Calcium: 8.6 mg/dL — ABNORMAL LOW (ref 8.9–10.3)
Chloride: 108 mmol/L (ref 98–111)
Creatinine, Ser: 2.42 mg/dL — ABNORMAL HIGH (ref 0.61–1.24)
GFR, Estimated: 29 mL/min — ABNORMAL LOW (ref >60)
Glucose, Bld: 95 mg/dL (ref 70–99)
Phosphorus: 3 mg/dL (ref 2.5–4.6)
Potassium: 3.9 mmol/L (ref 3.5–5.1)
Sodium: 143 mmol/L (ref 135–145)

## 2024-04-19 LAB — T3, FREE: T3, Free: 1.4 pg/mL — ABNORMAL LOW (ref 2.0–4.4)

## 2024-04-19 LAB — VALPROIC ACID LEVEL: Valproic Acid Lvl: 74 ug/mL (ref 50–100)

## 2024-04-19 LAB — MAGNESIUM: Magnesium: 2.2 mg/dL (ref 1.7–2.4)

## 2024-04-19 LAB — CORTISOL-AM, BLOOD: Cortisol - AM: 6.7 ug/dL (ref 6.7–22.6)

## 2024-04-19 MED ORDER — ENSURE PLUS HIGH PROTEIN PO LIQD
237.0000 mL | Freq: Three times a day (TID) | ORAL | Status: DC
  Administered 2024-04-20 – 2024-04-26 (×18): 237 mL via ORAL

## 2024-04-19 MED ORDER — HYDROCORTISONE SOD SUC (PF) 100 MG IJ SOLR
100.0000 mg | Freq: Three times a day (TID) | INTRAMUSCULAR | Status: DC
  Administered 2024-04-19 – 2024-04-21 (×6): 100 mg via INTRAVENOUS
  Filled 2024-04-19 (×8): qty 2

## 2024-04-19 MED ORDER — ADULT MULTIVITAMIN W/MINERALS CH
1.0000 | ORAL_TABLET | Freq: Every day | ORAL | Status: DC
  Administered 2024-04-20 – 2024-04-28 (×9): 1 via ORAL
  Filled 2024-04-19 (×9): qty 1

## 2024-04-19 MED ORDER — ENSURE PLUS HIGH PROTEIN PO LIQD
237.0000 mL | Freq: Two times a day (BID) | ORAL | Status: DC
  Administered 2024-04-19 (×2): 237 mL via ORAL

## 2024-04-19 NOTE — Consult Note (Signed)
 Cardiology Consultation   Patient ID: Albert Figueroa MRN: [id number]; DOB: [birth year]  Admit date: 04/16/2024 Date of Consult: 04/19/2024  PCP:  Alla Amis, MD   [facility] Providers Cardiologist:  None        Patient Profile: Albert Figueroa is a 68 y.o. 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 Isaiah.  History of Present Illness: Albert Figueroa was previously admitted 01/15/2024 through 01/19/2024 with hypotension/sinus bradycardia attributed to AKI and opiate overdose requiring Narcan  infusion.  He was admitted again 03/06/2024 to 03/08/2024 after presenting with dizziness and a fall at home.  He denied losing consciousness.  He reported falling more than 5 times in the past month.  Family reported he had been given lisinopril  and muscle relaxants by his ex-girlfriend which are not prescribed to him.  He was hypotensive with AKI.  Treated with IV fluids.  Also noted to have nondisplaced fracture of the left acromial process.  He presented again to the ED 7/18 after family felt that he was not acting like himself.  He had poor p.o. intake and family felt that he may be dehydrated.  He endorses chronic pain of his back and shoulder.  He endorses increasing weakness and recurrent falls. In the ED, BP 87/62 with otherwise normal vital signs.  Labs notable for K3.3, BUN 49, creatinine 3.49, albumin 2.8, hemoglobin 9.2, hematocrit 29.1, MCV 106, platelets 52, and ammonia 53.  CT abdomen pelvis without acute intra-abdominal or intrapelvic abnormality.  CT head without cute intracranial abnormality.  CT cervical spine without acute fracture or traumatic malalignment.  EKG shows normal sinus rhythm without acute ST/T wave changes.  Troponin negative.  Echo was obtained and showed EF 60 to 65%  with no RWMA.  He was started on lactulose  and IV fluids in the ED.  He was initially admitted to the stepdown unit for hospital stay but remain in the ER pending bed availability.  While holding in the ED, patient complained of pain and received scheduled 20 mg oxycodone  IR.  He reportedly became altered, hypotensive, bradycardic with pinpoint pupils.  He received Narcan , atropine , and briefly started on Levophed .  PCCM team consulted, however hypotension resolved and Levophed  was stopped and PCCM signed off.  Patient later became hypotensive again with recurrent sinus bradycardia despite aggressive IV fluid resuscitation requiring Levophed  and PCCM was reconsulted.  Patient was weaned off vasopressors and inotropes yesterday 7/20 and noted to have thrombocytopenia possibly secondary to antiepileptic therapy.  This morning, again requiring dopamine  to maintain BP, with attempt to wean, again became hypotensive and bradycardic.  Cardiology was asked to consult for bradycardia.  At time of cardiology consult, patient reports feeling great and wanting to go home.  He denies chest pain, shortness of breath, lightheadedness, dizziness, palpitations, and lower extremity swelling.  He does endorse recurrent falls.  He reports that these are mechanical in nature and never preceded by lightheadedness or dizziness.   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   Osteoarthrosis    Seizure (HCC)    none since 2009   Stage 4 chronic kidney disease (HCC)    Tremor of both hands     Past Surgical History:  Procedure Laterality  Date   CATARACT EXTRACTION W/PHACO Right 05/14/2023   Procedure: CATARACT EXTRACTION PHACO AND INTRAOCULAR LENS PLACEMENT (IOC) RIGHT DIABETIC;  Surgeon: Mittie Gaskin, MD;  Location: [facility];  Service: Ophthalmology;  Laterality: Right;  6.70 0:39.8   CATARACT EXTRACTION W/PHACO Left 05/28/2023   Procedure: CATARACT EXTRACTION PHACO AND INTRAOCULAR LENS PLACEMENT (IOC) LEFT DIABETIC 6.34 00:37.1;  Surgeon: Mittie Gaskin, MD;  Location: [facility];  Service: Ophthalmology;  Laterality: Left;   CSF SHUNT     placed in 1980s   KNEE SURGERY     multiple surgeries on both knees       Scheduled Meds:  budesonide -glycopyrrolate -formoterol   2 puff Inhalation BID   Chlorhexidine  Gluconate Cloth  6 each Topical Daily   feeding supplement  237 mL Oral BID BM   midodrine   10 mg Oral TID WC   pantoprazole  (PROTONIX ) IV  40 mg Intravenous Q24H   roflumilast   250 mcg Oral Daily   Continuous Infusions:  DOPamine  Stopped (04/19/24 1103)   valproate sodium  500 mg (04/19/24 1229)   PRN Meds: acetaminophen  **OR** acetaminophen , albuterol , hydrOXYzine , magnesium  hydroxide, ondansetron  **OR** ondansetron  (ZOFRAN ) IV, oxyCODONE , traZODone   Allergies:    Allergies  Allergen Reactions   Aricept  [Donepezil ] Other (See Comments)    SEVERE BRADYCARDIA WITH HEART BLOCK DO NOT RESTART AT DISCHARGE   Gabapentin  Other (See Comments)    Lethargic, Confused   Amlodipine     Other reaction(s): Other (see comments) Caused elevated blood pressure and headaches   Ozempic (0.25 Or 0.5 Mg-Dose) [Semaglutide(0.25 Or 0.5mg -Dos)]     Lethargy.  Also dropped blood sugars too low    Social History:   Social History   Socioeconomic History   Marital status: Single    Spouse name: Not on file   Number of children: Not on file   Years of education: Not on file   Highest education level: Not on file  Occupational History   Not on file  Tobacco Use   Smoking status: Every Day     Current packs/day: 2.00    Average packs/day: 2.0 packs/day for 48.0 years (96.0 ttl pk-yrs)    Types: Cigarettes   Smokeless tobacco: Never   Tobacco comments:    has cut back to 1 PPD  Vaping Use   Vaping status: Never Used  Substance and Sexual Activity   Alcohol use: No   Drug use: No   Sexual activity: Not Currently    Partners: Female    Birth control/protection: None  Other Topics Concern   Not on file  Social History Narrative   Not on file   Social Drivers of Health   Financial Resource Strain: High Risk (10/06/2023)   Received from [facility]   Overall Financial Resource Strain (CARDIA)    Difficulty of Paying Living Expenses: Hard  Food Insecurity: Patient Unable To Answer (04/17/2024)   Hunger Vital Sign    Worried About Running Out of Food in the Last Year: Patient unable to answer    Ran Out of Food in the  Last Year: Patient unable to answer  Transportation Needs: Patient Unable To Answer (04/17/2024)   PRAPARE - Transportation    Lack of Transportation (Medical): Patient unable to answer    Lack of Transportation (Non-Medical): Patient unable to answer  Physical Activity: Not on file  Stress: Not on file  Social Connections: Patient Unable To Answer (04/17/2024)   Social Connection and Isolation Panel    Frequency of Communication with Friends and Family: Patient unable to answer    Frequency of Social Gatherings with Friends and Family: Patient unable to answer    Attends Religious Services: Patient unable to answer    Active Member of Clubs or Organizations: Patient unable to answer    Attends Banker Meetings: Patient unable to answer    Marital Status: Patient unable to answer  Intimate Partner Violence: Patient Unable To Answer (04/17/2024)   Humiliation, Afraid, Rape, and Kick questionnaire    Fear of Current or Ex-Partner: Patient unable to answer    Emotionally Abused: Patient unable to answer    Physically Abused:  Patient unable to answer    Sexually Abused: Patient unable to answer    Family History:    Family History  Problem Relation Age of Onset   Diabetes Mother    Cancer Mother    CAD Father      ROS:  Please see the history of present illness.   All other ROS reviewed and negative.     Physical Exam/Data: Vitals:   04/19/24 1115 04/19/24 1130 04/19/24 1145 04/19/24 1200  BP: (!) 97/59 (!) 92/58 (!) 74/52 (!) 91/54  Pulse: 61 (!) 59 (!) 55 (!) 51  Resp: 12 16 18 12   Temp:      TempSrc:      SpO2: 95% 100% 98% 100%  Weight:      Height:        Intake/Output Summary (Last 24 hours) at 04/19/2024 1325 Last data filed at 04/19/2024 1142 Gross per 24 hour  Intake 735.19 ml  Output 1600 ml  Net -864.81 ml      04/17/2024   11:05 AM 03/06/2024   12:05 AM 03/05/2024   11:54 PM  Last 3 Weights  Weight (lbs) 125 lb 195 lb 1.6 oz 195 lb 8 oz  Weight (kg) 56.7 kg 88.497 kg 88.678 kg     Body mass index is 17.43 kg/m.  General:  Well nourished, well developed, in no acute distress HEENT: normal Neck: no JVD Vascular: No carotid bruits; Distal pulses 2+ bilaterally Cardiac:  normal S1, S2; RRR; no murmur  Lungs:  clear to auscultation bilaterally, no wheezing, rhonchi or rales  Abd: soft, nontender, no hepatomegaly  Ext: no edema Skin: warm and dry  Psych:  Normal affect   EKG:  The EKG was personally reviewed and demonstrates:  sinus bradycardia, rate 54 bpm Telemetry:  Telemetry was personally reviewed and demonstrates:  sinus rhythm, rate 45-80 bpm  Relevant CV Studies: 04/17/2024 Echo complete 1. TDS.   2. Left ventricular ejection fraction, by estimation, is 60 to 65%. The  left ventricle has normal function. The left ventricle has no regional  wall motion abnormalities. Left ventricular diastolic parameters are  consistent with Grade I diastolic  dysfunction (impaired relaxation).   3. Right ventricular systolic function is normal. The right ventricular  size is  normal.   4. The mitral valve is normal in structure. Trivial mitral valve  regurgitation.   5. The aortic valve is normal in structure.  Aortic valve regurgitation is  not visualized.   Laboratory Data: High Sensitivity Troponin:   Recent Labs  Lab 04/16/24 2210  TROPONINIHS 6     Chemistry Recent Labs  Lab 04/17/24 0633 04/18/24 0433 04/19/24 0530  NA 148* 146* 143  K 3.6 4.3 3.9  CL 112* 112* 108  CO2 27 22 26   GLUCOSE 88 132* 95  BUN 47* 40* 39*  CREATININE 3.07* 2.60* 2.42*  CALCIUM  7.9* 8.5* 8.6*  MG  --  2.3 2.2  GFRNONAA 21* 26* 29*  ANIONGAP 9 12 9     Recent Labs  Lab 04/16/24 2210 04/18/24 0433 04/19/24 0530  PROT 6.9  --   --   ALBUMIN 2.8* 2.5* 2.1*  AST 26  --   --   ALT 10  --   --   ALKPHOS 53  --   --   BILITOT 1.6*  --   --    Lipids No results for input(s): CHOL, TRIG, HDL, LABVLDL, LDLCALC, CHOLHDL in the last 168 hours.  Hematology Recent Labs  Lab 04/17/24 9366 04/17/24 1354 04/18/24 0433 04/19/24 0530  WBC 3.1*  3.0*  --  4.9 2.6*  RBC 2.59*  2.59*  --  2.94* 2.55*  HGB 8.9*  8.8*  --  9.8* 8.7*  HCT 27.0*  27.5* 28.1* 30.6* 26.5*  MCV 104.2*  106.2*  --  104.1* 103.9*  MCH 34.4*  34.0  --  33.3 34.1*  MCHC 33.0  32.0  --  32.0 32.8  RDW 15.4  15.4  --  15.0 14.9  PLT 35*  34*  --  43* 29*   Thyroid   Recent Labs  Lab 04/17/24 1141  TSH 2.599  FREET4 0.71    BNP Recent Labs  Lab 04/18/24 0433  BNP 420.4*    DDimer  Recent Labs  Lab 04/17/24 1141  DDIMER 1.02*    Radiology/Studies:  MR BRAIN WO CONTRAST Result Date: 04/17/2024 IMPRESSION: 1. No acute intracranial abnormality. 2. Unchanged chronic bilateral extra-axial collections compared to 04/17/2024 head CT, but increased since 03/10/2023 brain MRI. 3. Right posterior approach shunt catheter terminating near the right foramen of [place]ro. Electronically Signed   By: Franky Stanford M.D.   On: 04/17/2024 23:08   DG Chest Port 1 View Result Date:  04/17/2024 IMPRESSION: No acute cardiopulmonary disease. Electronically Signed   By: Alm Parkins M.D.   On: 04/17/2024 12:03   CT ABDOMEN PELVIS WO CONTRAST Result Date: 04/17/2024 IMPRESSION: 1. No acute intra-abdominal or intrapelvic abnormality with limited evaluation on this noncontrast study. 2. Trace simple free fluid ascites likely due to VP shunt. 3. Aortic Atherosclerosis (ICD10-I70.0) -severe, including four-vessel coronary calcification and aortic valve leaflet calcification-correlate for aortic stenosis. Electronically Signed   By: Morgane  Naveau M.D.   On: 04/17/2024 00:44   CT Head Wo Contrast Result Date: 04/17/2024 IMPRESSION: 1. No acute intracranial abnormality. 2. No acute fracture or traumatic malalignment of the cervical spine. Electronically signed by: Franky Stanford MD 04/17/2024 12:24 AM EDT RP Workstation: [id number]   CT Cervical Spine Wo Contrast Result Date: 04/17/2024 IMPRESSION: 1. No acute intracranial abnormality. 2. No acute fracture or traumatic malalignment of the cervical spine. Electronically signed by: Franky Stanford MD 04/17/2024 12:24 AM EDT RP Workstation: [id number]   Assessment and Plan:  Sinus bradycardia Hypotension - Appears to have low resting HR at baseline - Patient is relatively asymptomatic to his bradycardia - Echo this admission with normal LVEF and no RWMA - Telemetry reveals  sinus bradycardia without evidence of heart block or pauses - Received atropine  in the ED - S/p IV fluids with little improvement - Remains on dopamine  and midodrine  - Would recommend weaning dopamine  as able - Question possible component of adrenal insufficiency, consider starting steroids - No indication for PPM at this time  Acute toxic metabolic encephalopathy Seizure disorder Chronic pain Recurrent falls - UDS and ethanol negative - Mental status seems to be improving - Ongoing management per CCM  AKI on CKD IV - Cr 3.49 on admission, now 2.42 -  Continue to avoid nephrotoxic agents - Ongoing management per CCM  Acute on chronic thrombocytopenia - Denies bleeding - Hgb stable  For questions or updates, please contact [facility] Please consult [email or web address] for contact info under    Signed, Lesley LITTIE Maffucci, PA-C  04/19/2024 1:25 PM

## 2024-04-19 NOTE — Progress Notes (Addendum)
 NAME:  Albert Figueroa, MRN:  [id number], DOB:  10-Aug-1956, LOS: 2 ADMISSION DATE:  04/16/2024, CONSULTATION DATE:  04/17/2024 REFERRING MD:  Dr. Josette, CHIEF COMPLAINT:  Hypotension    History of Present Illness:  Albert Figueroa is a 68 y.o. 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  Pt previously hospitalized 04/17 to 04/21 with hypotension/sinus bradycardia attributed to AKI, and opiate overdose requiring narcan  infusion.     ED Course  Upon arrival to the ER CT Head/Cervical Spine negative for acute intracranial abnormality and acute fracture/traumatic malalignment of the cervical spine.  Significant lab results were: K+ 3.3/glucose 118/BUN 49/creatinine 3.49/calcium  8.6/albumin 2.8/total bilirubin 1.6/hgb 9.2/platelet count 52.  CT Abd/Pelvis revealed no acute intra-abdominal or intrapelvic abnormality, but revealed trace simple free fluid ascites likely due to VP shunt.  While in the ER pt received 1L NS bolus and 20 g of lactulose .  Pt initially admitted to the stepdown unit per hospitalist team but remained in the ER pending bed availability.  However, while holding in the ER pt c/o pain and received scheduled 20 mg oxycodone  ir.  He became altered, hypotensive, and bradycardic with pinpoint pupils.  He received narcan , atropine , and briefly started on levophed  gtt.  PCCM team consulted, however hypotension resolved and levophed  gtt stopped PCCM teamed signed off.  Pt later became hypotensive again with recurrent sinus bradycardia despite aggressive iv fluid resuscitation requiring levophed  gtt.  PCCM team reconsulted.   Please see Significant Hospital Events section below for full detailed hospital course.   Pertinent  Medical History  Arthritis  Type II Diabetes Mellitus   Emphysema/COPD  Epilepsy HLD HTN CKD Stage IV Lewy Body Dementia  Lumbar Stenosis  Spinal Meningitis  CSF Shunt  Seizure Tremors in both Hands  Micro Data:  7/19: SARS-CoV-2/flu/RSV PCR>> negative 7/19: Blood cultures x 2>> no growth to date 7/19: MRSA PCR>> negative 7/19: Respiratory viral panel>> negative  Antimicrobials:   Anti-infectives (From admission, onward)    Start     Dose/Rate Route Frequency Ordered Stop   04/19/24 1000  cefTRIAXone  (ROCEPHIN ) 2 g in sodium chloride  0.9 % 100 mL IVPB        2 g 200 mL/hr over 30 Minutes Intravenous Every 24 hours 04/18/24 1047     04/18/24 1000  vancomycin  (VANCOREADY) IVPB 750 mg/150 mL        750 mg 150 mL/hr over 60 Minutes Intravenous  Once 04/18/24 0859 04/18/24 1321   04/17/24 1000  cefTRIAXone  (ROCEPHIN ) 2 g in sodium chloride  0.9 % 100 mL IVPB  Status:  Discontinued        2 g 200 mL/hr over 30 Minutes Intravenous Every 12 hours 04/17/24 0803 04/18/24 1047   04/17/24 0900  vancomycin  (VANCOREADY) IVPB 2000 mg/400 mL        2,000 mg 200 mL/hr over 120 Minutes Intravenous  Once 04/17/24 0816 04/17/24 1123   04/17/24 0822  vancomycin  variable dose per unstable renal function (pharmacist dosing)         Does not apply See admin instructions 04/17/24 0822     04/17/24 0815  ampicillin  (OMNIPEN) 2 g in sodium chloride  0.9 % 100 mL IVPB  Status:  Discontinued        2 g 300 mL/hr over 20 Minutes  Intravenous Every 8 hours 04/17/24 0803 04/18/24 1046       Significant Hospital Events: Including procedures, antibiotic start and stop dates in addition to other pertinent events   07/18: Pt admitted to the stepdown unit with hypovolemic shock and acute toxic metabolic encephalopathy, however he remained in the ICU pending bed availability   07/19: Pt developed recurrent hypotension, sinus bradycardia, and worsening acute toxic metabolic encephalopathy requiring levophed  gtt.  Pt transferred to ICU and PCCM team consulted   04/18/24- patient off vasopressors and ionotropes today.  Thrombocytopenia possible due to anti epileptic therapy.  Follows commands but is with encephalopathy.  04/19/24: Again requiring Dopamine  to maintain BP, with attempt to wean, again became hypotensive and bradycardic.  Check cortisol, start stress dose steroids. Consult Cardiology.   Interim History / Subjective:  As outlined above under Significant Hospital Events section  Objective   Blood pressure 99/65, pulse 62, temperature 98.6 F (37 C), temperature source Oral, resp. rate 16, height 5' 11 (1.803 m), weight 56.7 kg, SpO2 100%.        Intake/Output Summary (Last 24 hours) at 04/19/2024 0847 Last data filed at 04/19/2024 0600 Gross per 24 hour  Intake 717.96 ml  Output 1600 ml  Net -882.04 ml   Filed Weights   04/17/24 1105  Weight: 56.7 kg    Examination: General: Acute on chronically ill-appearing frail elderly male, laying in bed, on room air, no acute distress HENT: Atraumatic, normocephalic, neck supple, no JVD Lungs: Clear breath sounds throughout, even, nonlabored Cardiovascular: Regular rate and rhythm, S1-S2, no murmurs, rubs, gallops Abdomen: Soft, nontender, nondistended, no guarding roundness, bowel sounds positive in 4 Extremities: Generalized weakness Neuro: Awake and alert, oriented only to self, moves all extremities commands GU: External male catheter in place  Resolved Hospital Problem list     Assessment & Plan:   #Acute toxic metabolic encephalopathy  #Seizure disorder #Chronic pain  #Recurrent falls  Hx: Lewy body dementia, meningitis s/p VP shunt, and epilepsy  -MRI Brain negative for acute intracranial process -UDS and Ethanol is negative -Treatment of metabolic derangements as outlined below -Provide supportive care -Promote normal sleep/wake cycle and family presence -Avoid sedating medications as able -Continue home Valproate   #Multifactorial Shock: hypovolemia and possible  medication induced due to polypharmacy #Sinus bradycardia  Hx: HLD and HTN  -Echocardiogram 04/17/24: LVEF 60-65%, grade I DD, RV systolic function is normal, RV size normal -Continuous cardiac monitoring -Maintain MAP >65 -IV fluids -Vasopressors as needed to maintain MAP goal -Continue Midodrine  10 mg TID -Lactic acid has normalized -HS Troponin is normal -TSH was normal, check Cortisol ~ start stress dose steroids -Consult Cardiology, appreciate input  #Emphysema/COPD without exacerbation  -Supplemental O2 as needed to maintain O2 sats 88 to 92% -Follow intermittent Chest X-ray & ABG as needed -Bronchodilators  -Pulmonary toilet as able  #Acute kidney injury superimposed on CKD stage IV #Hypernatremia -Monitor I&O's / urinary output -Follow BMP -Ensure adequate renal perfusion -Avoid nephrotoxic agents as able -Replace electrolytes as indicated ~ Pharmacy following for assistance with electrolyte replacement  #Leukopenia -Monitor fever curve -Trend WBC's & Procalcitonin -Follow cultures as above -Currently holding off on empiric ABX pending cultures & sensitivities -UA without evidence of UTI, Chest X-ray without evidence of pneumonia, abdominal exam is benign, CT abdomen pelvis without acute process  #Acute on Chronic Thrombocytopenia #Macrocytic Anemia ~ no signs of active bleeding -Monitor for S/Sx of bleeding -Trend CBC -SCD's for VTE Prophylaxis (no chemical ppx given thrombocytopenia) -Transfuse  for Hgb <7 -Transfuse platelets for platelet count <10 K , <50 K with active bleeding, or <100 K with Neurosurgical procedures/processes  #Type II Diabetes Mellitus -CBG's q4h; Target range of 140 to 180 -SSI -Follow ICU Hypo/Hyperglycemia protocol    Pt is critically ill with multiorgan failure. Prognosis is guarded, high risk for further decompensation, cardiac arrest, and death.  Given current critical illness superimposed on multiple chronic co-morbidities and  advanced age, overall long term prognosis is poor.  Pt is DNR/DNI status.  Depending on clinical course, may need to consult Palliative Care to assist with GOC discussions.   Best Practice (right click and Reselect all SmartList Selections daily)   Diet/type: Regular consistency (see orders) DVT prophylaxis: SCD GI prophylaxis: PPI Lines: N/A Foley:  N/A Code Status:  DNR Last date of multidisciplinary goals of care discussion [7/21]  7/21: Will update pt's family when they arrive at bedside on plan of care.  Labs   CBC: Recent Labs  Lab 04/16/24 2210 04/17/24 0633 04/18/24 0433 04/19/24 0530  WBC 4.5 3.1*  3.0* 4.9 2.6*  NEUTROABS  --  1.3* 3.2 1.1*  HGB 9.2* 8.9*  8.8* 9.8* 8.7*  HCT 29.1* 27.0*  27.5* 30.6* 26.5*  MCV 106.6* 104.2*  106.2* 104.1* 103.9*  PLT 52* 35*  34* 43* 29*    Basic Metabolic Panel: Recent Labs  Lab 04/16/24 2210 04/17/24 0633 04/18/24 0433 04/19/24 0530  NA 145 148* 146* 143  K 3.3* 3.6 4.3 3.9  CL 106 112* 112* 108  CO2 28 27 22 26   GLUCOSE 118* 88 132* 95  BUN 49* 47* 40* 39*  CREATININE 3.49* 3.07* 2.60* 2.42*  CALCIUM  8.6* 7.9* 8.5* 8.6*  MG  --   --  2.3 2.2  PHOS  --   --  4.3 3.0   GFR: Estimated Creatinine Clearance: 23.8 mL/min (A) (by C-G formula based on SCr of 2.42 mg/dL (H)). Recent Labs  Lab 04/16/24 2210 04/17/24 0633 04/17/24 1141 04/17/24 1354 04/18/24 0433 04/19/24 0530  WBC 4.5 3.1*  3.0*  --   --  4.9 2.6*  LATICACIDVEN  --   --  1.5 1.2  --   --     Liver Function Tests: Recent Labs  Lab 04/16/24 2210 04/18/24 0433 04/19/24 0530  AST 26  --   --   ALT 10  --   --   ALKPHOS 53  --   --   BILITOT 1.6*  --   --   PROT 6.9  --   --   ALBUMIN 2.8* 2.5* 2.1*   No results for input(s): LIPASE, AMYLASE in the last 168 hours. Recent Labs  Lab 04/16/24 2354  AMMONIA 53*    ABG    Component Value Date/Time   HCO3 30.4 (H) 07/05/2022 1405   O2SAT 89.6 07/05/2022 1405     Coagulation  Profile: No results for input(s): INR, PROTIME in the last 168 hours.  Cardiac Enzymes: Recent Labs  Lab 04/17/24 1141  CKTOTAL 174    HbA1C: Hgb A1c MFr Bld  Date/Time Value Ref Range Status  01/16/2024 05:28 AM 4.2 (L) 4.8 - 5.6 % Final    Comment:    (NOTE) Pre diabetes:          5.7%-6.4%  Diabetes:              >6.4%  Glycemic control for   <7.0% adults with diabetes   09/01/2022 06:41 PM 5.0 4.8 - 5.6 % Final  Comment:    (NOTE)         Prediabetes: 5.7 - 6.4         Diabetes: >6.4         Glycemic control for adults with diabetes: <7.0     CBG: Recent Labs  Lab 04/18/24 1611 04/18/24 1948 04/18/24 2334 04/19/24 0327 04/19/24 0753  GLUCAP 92 126* 100* 93 79    Review of Systems:   Positives in BOLD: Pt reports pain everywhere Gen: Denies fever, chills, weight change, malaise/fatigue, night sweats HEENT: Denies blurred vision, double vision, hearing loss, tinnitus, sinus congestion, rhinorrhea, sore throat, neck stiffness, dysphagia PULM: Denies shortness of breath, cough, sputum production, hemoptysis, wheezing CV: Denies chest pain, edema, orthopnea, paroxysmal nocturnal dyspnea, palpitations GI: Denies abdominal pain, nausea, vomiting, diarrhea, hematochezia, melena, constipation, change in bowel habits GU: Denies dysuria, hematuria, polyuria, oliguria, urethral discharge Endocrine: Denies hot or cold intolerance, polyuria, polyphagia or appetite change Derm: Denies rash, dry skin, scaling or peeling skin change Heme: Denies easy bruising, bleeding, bleeding gums Neuro: Denies headache, numbness, weakness, slurred speech, loss of memory or consciousness   Past Medical History:  He,  has a past medical history of Arthritis, Diabetes mellitus without complication (HCC), Emphysema/COPD (HCC), Epilepsy (HCC), History of brain shunt, Hyperlipidemia, Hypertension, Kidney disease, Lewy body dementia (HCC), Lumbar stenosis, Meningitis spinal,  Osteoarthrosis, Seizure (HCC), Stage 4 chronic kidney disease (HCC), and Tremor of both hands.   Surgical History:   Past Surgical History:  Procedure Laterality Date   CATARACT EXTRACTION W/PHACO Right 05/14/2023   Procedure: CATARACT EXTRACTION PHACO AND INTRAOCULAR LENS PLACEMENT (IOC) RIGHT DIABETIC;  Surgeon: Mittie Gaskin, MD;  Location: [facility];  Service: Ophthalmology;  Laterality: Right;  6.70 0:39.8   CATARACT EXTRACTION W/PHACO Left 05/28/2023   Procedure: CATARACT EXTRACTION PHACO AND INTRAOCULAR LENS PLACEMENT (IOC) LEFT DIABETIC 6.34 00:37.1;  Surgeon: Mittie Gaskin, MD;  Location: [facility];  Service: Ophthalmology;  Laterality: Left;   CSF SHUNT     placed in 1980s   KNEE SURGERY     multiple surgeries on both knees     Social History:   reports that he has been smoking cigarettes. He has a 96 pack-year smoking history. He has never used smokeless tobacco. He reports that he does not drink alcohol and does not use drugs.   Family History:  His family history includes CAD in his father; Cancer in his mother; Diabetes in his mother.   Allergies Allergies  Allergen Reactions   Aricept  [Donepezil ] Other (See Comments)    SEVERE BRADYCARDIA WITH HEART BLOCK DO NOT RESTART AT DISCHARGE   Gabapentin  Other (See Comments)    Lethargic, Confused   Amlodipine     Other reaction(s): Other (see comments) Caused elevated blood pressure and headaches   Ozempic (0.25 Or 0.5 Mg-Dose) [Semaglutide(0.25 Or 0.5mg -Dos)]     Lethargy.  Also dropped blood sugars too low     Home Medications  Prior to Admission medications   Medication Sig Start Date End Date Taking? Authorizing Provider  acetaminophen  (TYLENOL ) 325 MG tablet Take 2 tablets (650 mg total) by mouth every 6 (six) hours as needed for mild pain (or Fever >/= 101). 09/07/22  Yes Krishnan, Sendil K, MD  albuterol  (VENTOLIN  HFA) 108 (90 Base) MCG/ACT inhaler Inhale into the lungs every 6  (six) hours as needed for wheezing or shortness of breath.   Yes [provider]  cyclobenzaprine  (FEXMID ) 7.5 MG tablet Take 7.5 mg by mouth 3 (  three) times daily. 03/08/24  Yes [provider]  dexlansoprazole (DEXILANT) 60 MG capsule Take 60 mg by mouth daily.   Yes [provider]  divalproex  (DEPAKOTE  ER) 500 MG 24 hr tablet Take 1,000 mg by mouth in the morning and at bedtime.   Yes [provider]  donepezil  (ARICEPT  ODT) 10 MG disintegrating tablet Take 10 mg by mouth at bedtime.   Yes [provider]  fenofibrate  160 MG tablet Take 160 mg by mouth daily. 04/25/22  Yes [provider]  ferrous sulfate  325 (65 FE) MG tablet Take 325 mg by mouth daily with breakfast.   Yes [provider]  Fluticasone -Umeclidin-Vilant (TRELEGY ELLIPTA) 100-62.5-25 MCG/ACT AEPB Inhale into the lungs daily.   Yes [provider]  furosemide (LASIX) 20 MG tablet Take 20 mg by mouth daily. 03/19/24  Yes [provider]  hydrOXYzine  (ATARAX ) 10 MG tablet Take 10 mg by mouth 3 (three) times daily as needed.   Yes [provider]  lidocaine  (LIDODERM ) 5 % Place 1 patch onto the skin daily. 04/05/24  Yes [provider]  lisinopril  (ZESTRIL ) 20 MG tablet Take 20 mg by mouth daily. 03/23/24  Yes [provider]  naloxone  (NARCAN ) nasal spray 4 mg/0.1 mL Place 1 spray into the nose once. 11/29/22  Yes [provider]  Oxycodone  HCl 20 MG TABS Take 20 mg by mouth every 4 (four) hours. 02/09/24  Yes [provider]  rosuvastatin  (CRESTOR ) 20 MG tablet Take 20 mg by mouth at bedtime. 04/08/22  Yes [provider]  tiZANidine  (ZANAFLEX ) 4 MG capsule Take 4 mg by mouth every 12 (twelve) hours.   Yes [provider]  dapagliflozin  propanediol (FARXIGA ) 10 MG TABS tablet Take 10 mg by mouth daily. Patient not taking: Reported on 04/17/2024    [provider]  lactulose  (CHRONULAC ) 10  GM/15ML solution Take 30 mLs (20 g total) by mouth 2 (two) times daily. Titrate to 1--2 bowel movements per day Patient not taking: Reported on 04/17/2024 01/01/24   Von Bellis, MD  Roflumilast  250 MCG TABS Take 1 tablet by mouth daily. Patient not taking: Reported on 04/17/2024    [provider]  traZODone  (DESYREL ) 50 MG tablet Take 0.5 tablets (25 mg total) by mouth at bedtime as needed for sleep. Patient not taking: Reported on 04/17/2024 12/24/23   Barbarann Nest, MD     Critical care time: 40 minutes     Inge Lecher, AGACNP-BC [facility] Prefer epic messenger for cross cover needs If after hours, please call E-link

## 2024-04-19 NOTE — Consult Note (Signed)
 ORTHOPAEDIC CONSULTATION  REQUESTING PHYSICIAN: Isaiah Scrivener, MD  Chief Complaint:   History of left acromial fracture.  History of Present Illness: Albert Figueroa is a 68 y.o. male with multiple medical problems including COPD/emphysema, diabetes, hypertension, hyperlipidemia, chronic kidney disease, and Lewy body dementia who was admitted 2 days ago for work-up of recent falls with generalized weakness and concern for dehydration.  The patient underwent resuscitation efforts in the emergency room, but the patient became bradycardic and hypotensive after receiving oral pain medication.  He responded to Narcan , atropine , and a Levophed  drip but then became hypotensive again.  Therefore, he was admitted to the ICU for medical stabilization.  The patient has been improving over the past 24 hours and Occupational Therapy was consulted for assistance with ADLs.  She has expressed concern about his history of a minimally displaced left acromial fracture identified on a single x-ray view in early June with no clear orthopedic input or follow-up.  Therefore, orthopedic consultation has been requested at this time.  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, spinal meningitis   Hyperlipidemia    Hypertension    Kidney disease    stage 3   Lewy body dementia (HCC)    short term memory issues only   Lumbar stenosis    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   Osteoarthrosis    Seizure (HCC)    none since 2009   Stage 4 chronic kidney disease (HCC)    Tremor of both hands    Past Surgical History:  Procedure Laterality Date   CATARACT EXTRACTION W/PHACO Right 05/14/2023   Procedure: CATARACT EXTRACTION PHACO AND INTRAOCULAR LENS PLACEMENT (IOC) RIGHT  DIABETIC;  Surgeon: Mittie Gaskin, MD;  Location: [facility];  Service: Ophthalmology;  Laterality: Right;  6.70 0:39.8   CATARACT EXTRACTION W/PHACO Left 05/28/2023   Procedure: CATARACT EXTRACTION PHACO AND INTRAOCULAR LENS PLACEMENT (IOC) LEFT DIABETIC 6.34 00:37.1;  Surgeon: Mittie Gaskin, MD;  Location: [facility];  Service: Ophthalmology;  Laterality: Left;   CSF SHUNT     placed in 1980s   KNEE SURGERY     multiple surgeries on both knees   Social History   Socioeconomic History   Marital status: Single    Spouse name: Not on file   Number of children: Not on file   Years of education: Not on file   Highest education level: Not on file  Occupational History   Not on file  Tobacco Use   Smoking status: Every Day    Current packs/day: 2.00    Average packs/day: 2.0 packs/day for 48.0 years (96.0 ttl pk-yrs)    Types: Cigarettes   Smokeless tobacco: Never   Tobacco comments:    has cut back to 1 PPD  Vaping Use   Vaping status: Never Used  Substance and Sexual Activity   Alcohol use: No   Drug use: No   Sexual activity: Not Currently    Partners: Female    Birth control/protection: None  Other Topics Concern   Not on file  Social History Narrative   Not on file   Social Drivers of Health   Financial Resource Strain: High Risk (10/06/2023)   Received from [facility]  Overall Financial Resource Strain (CARDIA)    Difficulty of Paying Living Expenses: Hard  Food Insecurity: Patient Unable To Answer (04/17/2024)   Hunger Vital Sign    Worried About Running Out of Food in the Last Year: Patient unable to answer    Ran Out of Food in the Last Year: Patient unable to answer  Transportation Needs: Patient Unable To Answer (04/17/2024)   PRAPARE - Transportation    Lack of Transportation (Medical): Patient unable to answer    Lack of Transportation (Non-Medical): Patient unable to answer  Physical Activity: Not on file   Stress: Not on file  Social Connections: Patient Unable To Answer (04/17/2024)   Social Connection and Isolation Panel    Frequency of Communication with Friends and Family: Patient unable to answer    Frequency of Social Gatherings with Friends and Family: Patient unable to answer    Attends Religious Services: Patient unable to answer    Active Member of Clubs or Organizations: Patient unable to answer    Attends Banker Meetings: Patient unable to answer    Marital Status: Patient unable to answer   Family History  Problem Relation Age of Onset   Diabetes Mother    Cancer Mother    CAD Father    Allergies  Allergen Reactions   Aricept  [Donepezil ] Other (See Comments)    SEVERE BRADYCARDIA WITH HEART BLOCK DO NOT RESTART AT DISCHARGE   Gabapentin  Other (See Comments)    Lethargic, Confused   Amlodipine     Other reaction(s): Other (see comments) Caused elevated blood pressure and headaches   Ozempic (0.25 Or 0.5 Mg-Dose) [Semaglutide(0.25 Or 0.5mg -Dos)]     Lethargy.  Also dropped blood sugars too low   Prior to Admission medications   Medication Sig Start Date End Date Taking? Authorizing Provider  acetaminophen  (TYLENOL ) 325 MG tablet Take 2 tablets (650 mg total) by mouth every 6 (six) hours as needed for mild pain (or Fever >/= 101). 09/07/22  Yes Krishnan, Sendil K, MD  albuterol  (VENTOLIN  HFA) 108 [phone] Base) MCG/ACT inhaler Inhale into the lungs every 6 (six) hours as needed for wheezing or shortness of breath.   Yes [provider]  cyclobenzaprine  (FEXMID ) 7.5 MG tablet Take 7.5 mg by mouth 3 (three) times daily. 03/08/24  Yes [provider]  dexlansoprazole (DEXILANT) 60 MG capsule Take 60 mg by mouth daily.   Yes [provider]  divalproex  (DEPAKOTE  ER) 500 MG 24 hr tablet Take 1,000 mg by mouth in the morning and at bedtime.   Yes [provider]  donepezil  (ARICEPT  ODT) 10 MG disintegrating tablet Take 10 mg by mouth at  bedtime.   Yes [provider]  fenofibrate  160 MG tablet Take 160 mg by mouth daily. 04/25/22  Yes [provider]  ferrous sulfate  325 (65 FE) MG tablet Take 325 mg by mouth daily with breakfast.   Yes [provider]  Fluticasone -Umeclidin-Vilant (TRELEGY ELLIPTA) 100-62.5-25 MCG/ACT AEPB Inhale into the lungs daily.   Yes [provider]  furosemide (LASIX) 20 MG tablet Take 20 mg by mouth daily. 03/19/24  Yes [provider]  hydrOXYzine  (ATARAX ) 10 MG tablet Take 10 mg by mouth 3 (three) times daily as needed.   Yes [provider]  lidocaine  (LIDODERM ) 5 % Place 1 patch onto the skin daily. 04/05/24  Yes [provider]  lisinopril  (ZESTRIL ) 20 MG tablet Take 20 mg by mouth daily. 03/23/24  Yes [provider]  naloxone  (NARCAN ) nasal spray 4 mg/0.1 mL Place 1 spray into the nose once. 11/29/22  Yes [provider]  Oxycodone  HCl 20 MG TABS Take 20 mg by mouth every 4 (four) hours. 02/09/24  Yes [provider]  rosuvastatin  (CRESTOR ) 20 MG tablet Take 20 mg by mouth at bedtime. 04/08/22  Yes [provider]  tiZANidine  (ZANAFLEX ) 4 MG capsule Take 4 mg by mouth every 12 (twelve) hours.   Yes [provider]  dapagliflozin  propanediol (FARXIGA ) 10 MG TABS tablet Take 10 mg by mouth daily. Patient not taking: Reported on 04/17/2024    [provider]  lactulose  (CHRONULAC ) 10 GM/15ML solution Take 30 mLs (20 g total) by mouth 2 (two) times daily. Titrate to 1--2 bowel movements per day Patient not taking: Reported on 04/17/2024 01/01/24   Von Bellis, MD  Roflumilast  250 MCG TABS Take 1 tablet by mouth daily. Patient not taking: Reported on 04/17/2024    [provider]  traZODone  (DESYREL ) 50 MG tablet Take 0.5 tablets (25 mg total) by mouth at bedtime as needed for sleep. Patient not taking: Reported on 04/17/2024 12/24/23   Barbarann Nest, MD   MR BRAIN WO CONTRAST Result  Date: 04/17/2024 CLINICAL DATA:  Acute neurologic deficit EXAM: MRI HEAD WITHOUT CONTRAST TECHNIQUE: Multiplanar, multiecho pulse sequences of the brain and surrounding structures were obtained without intravenous contrast. COMPARISON:  03/10/2023 FINDINGS: Brain: No acute infarct, mass effect or extra-axial collection. Unchanged chronic bilateral extra-axial collections compared to 04/17/2024 head CT, but increased since 03/10/2023 brain MRI. Chronic blood products at site of right frontal encephalomalacia. Right posterior approach shunt catheter terminating near the right foramen of Monro. Old left basal ganglia small vessel infarct. The midline structures are normal. Vascular: Normal flow voids. Skull and upper cervical spine: Normal calvarium and skull base. Visualized upper cervical spine and soft tissues are normal. Sinuses/Orbits:No paranasal sinus fluid levels or advanced mucosal thickening. No mastoid or middle ear effusion. Normal orbits. IMPRESSION: 1. No acute intracranial abnormality. 2. Unchanged chronic bilateral extra-axial collections compared to 04/17/2024 head CT, but increased since 03/10/2023 brain MRI. 3. Right posterior approach shunt catheter terminating near the right foramen of Monro. Electronically Signed   By: Franky Stanford M.D.   On: 04/17/2024 23:08    Positive ROS: All other systems have been reviewed and were otherwise negative with the exception of those mentioned in the HPI and as above.  Physical Exam: General:  Alert, no acute distress Psychiatric:  Patient is not competent for consent, but exhibits normal mood and affect   Cardiovascular:  No pedal edema Respiratory:  No wheezing, non-labored breathing GI:  Abdomen is soft and non-tender Skin:  No lesions in the area of chief complaint Neurologic:  Sensation intact distally Lymphatic:  No axillary or cervical lymphadenopathy  Orthopedic Exam:  Orthopedic examination is limited to the left shoulder and upper  extremity.  Skin inspection around the left shoulder is unremarkable.  No swelling, erythema, ecchymosis, abrasions, or other skin abnormalities are identified.  However, the humeral head does appear to be somewhat prominent anteriorly, most likely due to a chronic rotator cuff tear with anterior superior escape.  He has no tenderness to palpation over the acromial process, nor are there any other areas of tenderness around the shoulder.  Actively, he is able to forward flex to 160 degrees and abduct to 150 degrees without any discomfort.  He is able to internally rotate to his left buttock, also without any discomfort.  He  exhibits roughly 4/5 strength with resisted forward flexion and abduction, as well as with resisted internal and external rotation.  He is grossly neurovascularly intact to the left upper extremity and hand.  X-rays:  A single view of his left shoulder from 03/06/2024 is available for review and has been reviewed by myself.  By report, the study demonstrates what appears to be an essentially nondisplaced fracture through the acromion.  I agree with this finding.  A recent chest x-ray does not give a good view of the acromion to assess healing.  Therefore, additional films have been ordered as a precaution.  Assessment: 1.  Status post minimally displaced acromial fracture left shoulder.   2.  Probable chronic rotator cuff arthropathy secondary to massive irreparable rotator cuff tear, presently asymptomatic.  Plan: The treatment options have been discussed with the patient.  Given that he has no tenderness to palpation over the acromion, nor does he have any difficulty with active or passive motion of the shoulder, I do not feel that there are any restrictions to physical and/or occupational therapy at this time.  He does have what appears to be a chronic irreparable massive rotator cuff tear permitting anterior superior escape of the humeral head, but this is asymptomatic at present  and therefore does not require any treatment at this time.  Thank you for asking me to participate in the care of this most pleasant and unfortunate man.  I will be happy to follow him with you.   DOROTHA Reyes Maltos, MD  Beeper #:  [phone]  04/19/2024 5:33 PM

## 2024-04-19 NOTE — Progress Notes (Signed)
 Occupational Therapy Treatment Patient Details Name: Albert Figueroa MRN: [id number] DOB: Nov 21, 1955 Today's Date: 04/19/2024   History of present illness Pt is a 68 y.o. male who presented to the ER with acute onset of generalized weakness and concern for dehydration with recent recurrent falls. Admitted for management of  Hypovolemic shock, acute metabolic encephalopathy, and sinus bradycardia. 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 Easily arousable and agreeable to OT session. He continues to have back and LUE pain-continues to try using LUE although educated not to. Messaged attending MD who is consulting ortho for formal recommendations. Pt performed bed mobility with Mod A to reach supine with multimodal cues and step by step instruction for bed rail use and initiation. Pt reports feeling great. BP monitored in supine: 127/67, sitting EOB 93/70 MAP of 75, HR up to 133 and maintaining there. Pt sat up ~8 mins with no reports of lightheadedness, therefore attempted STS, but only partial STS with Max A completed with pt then stating he needed to lay down because he did not feel well. Min A to return to supine for BLE management and BP rechecked at 80/59 MAP of 67 and HR 83. Nurse in to assist with scooting to [facility], Max A x2 and IV noted to be bleeding. BP rechecked and up to 95/60 MAP of 71. Pt fatigued after session immediately closing his eyes, but remain verbal throughout. As OT walking out of room, pt states he is going home tonight. He has poor safety awareness and insight into deficits from lewy body dementia. Pt returned to bed with all needs in place and will cont to require skilled acute OT services to maximize his safety and IND to return to PLOF.       If plan is discharge  home, recommend the following:  A lot of help with walking and/or transfers;A lot of help with bathing/dressing/bathroom;Assistance with cooking/housework;Direct supervision/assist for financial management;Supervision due to cognitive status;Help with stairs or ramp for entrance;Direct supervision/assist for medications management;Assist for transportation   Equipment Recommendations  Other (comment) (defer)    Recommendations for Other Services      Precautions / Restrictions Precautions Precautions: Fall Restrictions LUE Weight Bearing Per Provider Order: Non weight bearing Other Position/Activity Restrictions: per previous admission 1 month ago, pt with L acromial fx to be in sling and NWB?       Mobility Bed Mobility Overal bed mobility: Needs Assistance Bed Mobility: Supine to Sit, Sit to Supine     Supine to sit: Mod assist Sit to supine: Min assist   General bed mobility comments: step by step, multi modal cueing need to initiate and transition to EOB this date with constant cueing not to utilize LUE; pt with drop in BP, although MAP stable and able to tolerate sitting EOB x8 mins this date; lateral scoot x1 with CGA and Min A to return to supine d/t not feeling well after increased sitting, BP rechecked in supine and noted to be 80/59, checked again a few mins later with improvement to 95/60-MAP stable >65 throughout    Transfers Overall transfer level: Needs assistance Equipment used: Rolling walker (2 wheels) Transfers: Sit to/from Stand             General transfer comment: attempted STS as pt MAP stable and feeling  well, however needed Max A to perform partial STS this date and reported needing to lay back down     Balance Overall balance assessment: Needs assistance Sitting-balance support: Feet supported, Single extremity supported Sitting balance-Leahy Scale: Good Sitting balance - Comments: RUE on bed and bil feet with SBA x8 mins       Standing  balance comment: unable to come to full upright stand and pt reporting not feeling well after attempt requiring return to supine and noted with orthostasis                           ADL either performed or assessed with clinical judgement   ADL                                              Extremity/Trunk Assessment              Vision       Perception     Praxis     Communication Communication Communication: No apparent difficulties   Cognition Arousal: Alert, Lethargic Behavior During Therapy: Impulsive Cognition: History of cognitive impairments             OT - Cognition Comments: lewy body dementia with STM deficits per chart review                 Following commands: Impaired Following commands impaired: Follows one step commands inconsistently, Follows one step commands with increased time      Cueing   Cueing Techniques: Verbal cues, Tactile cues, Visual cues  Exercises      Shoulder Instructions       General Comments monitored orthostatic BP and HR throughout session, HR up to 133 with sitting EOB and did not flucuate, returned to 83 once in supine; nurse aware and notified of IV bleeding/leaking    Pertinent Vitals/ Pain       Pain Assessment Pain Assessment: Faces Faces Pain Scale: Hurts a little bit Pain Location: back and LUE as he still tries to use it even when advised not to Pain Descriptors / Indicators: Grimacing Pain Intervention(s): Monitored during session, Repositioned  Home Living                                          Prior Functioning/Environment              Frequency  Min 2X/week        Progress Toward Goals  OT Goals(current goals can now be found in the care plan section)  Progress towards OT goals: Progressing toward goals  Acute Rehab OT Goals Patient Stated Goal: improve function and BP OT Goal Formulation: With patient Time For Goal Achievement:  05/02/24 Potential to Achieve Goals: Fair  Plan      Co-evaluation                 AM-PAC OT 6 Clicks Daily Activity     Outcome Measure   Help from another person eating meals?: None Help from another person taking care of personal grooming?: None Help from another person toileting, which includes using toliet, bedpan, or urinal?: A Lot Help from another person bathing (including washing, rinsing, drying)?: A Lot Help from  another person to put on and taking off regular upper body clothing?: A Little Help from another person to put on and taking off regular lower body clothing?: A Lot 6 Click Score: 17    End of Session    OT Visit Diagnosis: Other abnormalities of gait and mobility (R26.89);Muscle weakness (generalized) (M62.81)   Activity Tolerance Treatment limited secondary to medical complications (Comment) (Orthostasis)   Patient Left in bed;with call bell/phone within reach;with bed alarm set;with nursing/sitter in room   Nurse Communication Mobility status        Time: 8364-8346 OT Time Calculation (min): 18 min  Charges: OT General Charges $OT Visit: 1 Visit OT Treatments $Therapeutic Activity: 8-22 mins  Kolden Dupee, OTR/L  04/19/24, 5:18 PM   Jakolby Sedivy E Aryonna Gunnerson 04/19/2024, 5:13 PM

## 2024-04-19 NOTE — Plan of Care (Signed)
  Problem: Education: Goal: Knowledge of General Education information will improve Description: Including pain rating scale, medication(s)/side effects and non-pharmacologic comfort measures Outcome: Progressing   Problem: Clinical Measurements: Goal: Diagnostic test results will improve Outcome: Progressing Goal: Respiratory complications will improve Outcome: Progressing   Problem: Coping: Goal: Level of anxiety will decrease Outcome: Progressing   Problem: Elimination: Goal: Will not experience complications related to bowel motility Outcome: Progressing Goal: Will not experience complications related to urinary retention Outcome: Progressing   Problem: Safety: Goal: Ability to remain free from injury will improve Outcome: Progressing   Problem: Skin Integrity: Goal: Risk for impaired skin integrity will decrease Outcome: Progressing

## 2024-04-19 NOTE — Care Management Important Message (Signed)
 Important Message  Patient Details  Name: Albert Figueroa MRN: [id number] Date of Birth: 1955/11/30   Important Message Given:  Yes - Medicare IM     Albert Figueroa 04/19/2024, 12:48 PM

## 2024-04-20 DIAGNOSIS — R570 Cardiogenic shock: Secondary | ICD-10-CM | POA: Diagnosis not present

## 2024-04-20 DIAGNOSIS — N179 Acute kidney failure, unspecified: Secondary | ICD-10-CM | POA: Diagnosis not present

## 2024-04-20 DIAGNOSIS — N184 Chronic kidney disease, stage 4 (severe): Secondary | ICD-10-CM

## 2024-04-20 DIAGNOSIS — G9341 Metabolic encephalopathy: Secondary | ICD-10-CM | POA: Diagnosis not present

## 2024-04-20 DIAGNOSIS — R571 Hypovolemic shock: Secondary | ICD-10-CM | POA: Diagnosis not present

## 2024-04-20 DIAGNOSIS — R001 Bradycardia, unspecified: Secondary | ICD-10-CM | POA: Diagnosis not present

## 2024-04-20 DIAGNOSIS — I959 Hypotension, unspecified: Secondary | ICD-10-CM | POA: Diagnosis not present

## 2024-04-20 LAB — RENAL FUNCTION PANEL
Albumin: 2.5 g/dL — ABNORMAL LOW (ref 3.5–5.0)
Anion gap: 12 (ref 5–15)
BUN: 35 mg/dL — ABNORMAL HIGH (ref 8–23)
CO2: 23 mmol/L (ref 22–32)
Calcium: 8.9 mg/dL (ref 8.9–10.3)
Chloride: 108 mmol/L (ref 98–111)
Creatinine, Ser: 2.19 mg/dL — ABNORMAL HIGH (ref 0.61–1.24)
GFR, Estimated: 32 mL/min — ABNORMAL LOW (ref >60)
Glucose, Bld: 138 mg/dL — ABNORMAL HIGH (ref 70–99)
Phosphorus: 3.9 mg/dL (ref 2.5–4.6)
Potassium: 4.3 mmol/L (ref 3.5–5.1)
Sodium: 143 mmol/L (ref 135–145)

## 2024-04-20 LAB — CBC
HCT: 32.8 % — ABNORMAL LOW (ref 39.0–52.0)
Hemoglobin: 10.5 g/dL — ABNORMAL LOW (ref 13.0–17.0)
MCH: 33.5 pg (ref 26.0–34.0)
MCHC: 32 g/dL (ref 30.0–36.0)
MCV: 104.8 fL — ABNORMAL HIGH (ref 80.0–100.0)
Platelets: 34 K/uL — ABNORMAL LOW (ref 150–400)
RBC: 3.13 MIL/uL — ABNORMAL LOW (ref 4.22–5.81)
RDW: 14.8 % (ref 11.5–15.5)
WBC: 2.7 K/uL — ABNORMAL LOW (ref 4.0–10.5)
nRBC: 0 % (ref 0.0–0.2)

## 2024-04-20 LAB — GLUCOSE, CAPILLARY
Glucose-Capillary: 151 mg/dL — ABNORMAL HIGH (ref 70–99)
Glucose-Capillary: 199 mg/dL — ABNORMAL HIGH (ref 70–99)
Glucose-Capillary: 154 mg/dL — ABNORMAL HIGH (ref 70–99)
Glucose-Capillary: 164 mg/dL — ABNORMAL HIGH (ref 70–99)
Glucose-Capillary: 130 mg/dL — ABNORMAL HIGH (ref 70–99)

## 2024-04-20 NOTE — Progress Notes (Signed)
 Patient ID: Albert Figueroa, male   DOB: 03-05-1956, 68 y.o.   MRN: [id number]  Subjective: The patient has no new complaints pertaining to his left shoulder on today's visit.   Objective: Vital signs in last 24 hours: Temp:  [97.6 F (36.4 C)-98 F (36.7 C)] 97.6 F (36.4 C) (07/22 0900) Pulse Rate:  [47-95] 50 (07/22 1200) Resp:  [0-23] 9 (07/22 1200) BP: (76-133)/(46-74) 87/46 (07/22 1200) SpO2:  [75 %-100 %] 100 % (07/22 1200) Weight:  [58.7 kg] 58.7 kg (07/22 0500)  Intake/Output from previous day: 07/21 0701 - 07/22 0700 In: 777.1 [P.O.:480; I.V.:107.5; IV Piggyback:189.6] Out: 800 [Urine:800] Intake/Output this shift: Total I/O In: 22.6 [I.V.:22.6] Out: -   Recent Labs    04/18/24 0433 04/19/24 0530 04/20/24 0426  HGB 9.8* 8.7* 10.5*   Recent Labs    04/19/24 0530 04/20/24 0426  WBC 2.6* 2.7*  RBC 2.55* 3.13*  HCT 26.5* 32.8*  PLT 29* 34*   Recent Labs    04/19/24 0530 04/20/24 0427  NA 143 143  K 3.9 4.3  CL 108 108  CO2 26 23  BUN 39* 35*  CREATININE 2.42* 2.19*  GLUCOSE 95 138*  CALCIUM  8.6* 8.9   No results for input(s): LABPT, INR in the last 72 hours.  Physical Exam: Orthopedic examination is unchanged as compared to yesterday.  He has no pain to palpation over the shoulder and is able to actively move his shoulder freely without discomfort.  He remains neurovascularly intact to the left upper extremity and hand.  X-rays: Three views of the left shoulder were obtained.  These films are available for review and have been reviewed by myself.  These films demonstrate excellent interval healing of the minimally displaced left acromial fracture with overall satisfactory maintenance of fracture alignment.  No new acute bony abnormalities are identified.  Assessment: Status post minimally displaced left acromial fracture with excellent interval healing both clinically and radiographically.  Plan: The treatment options were reviewed with the  patient.  He may continue to progress with his activities utilizing the left upper extremity without restrictions.  He may be mobilized with physical and/or occupational therapy as tolerated.  Thank you for asking me to participate in the care of this most pleasant young unfortunate man.  I will sign off at this time.  Please reconsult me if there is any need for further orthopedic input during this hospitalization.   Norleen PARAS Nzinga Ferran 04/20/2024, 3:51 PM

## 2024-04-20 NOTE — TOC Initial Note (Signed)
 Transition of Care [facility]) - Initial/Assessment Note    Patient Details  Name: Albert Figueroa MRN: [id number] Date of Birth: 18-Feb-1956  Transition of Care [facility]) CM/SW Contact:    Delphine KANDICE Bring, RN Phone Number: 04/20/2024, 4:45 PM  Clinical Narrative:                 Patient states that he lives alone but his children lives close by. Patient denies services in the  home. Patient states tha this son Elspeth [phone] assist him at home.   Expected Discharge Plan: Home w Home Health Services     Patient Goals and CMS Choice            Expected Discharge Plan and Services       Living arrangements for the past 2 months: Apartment                                      Prior Living Arrangements/Services Living arrangements for the past 2 months: Apartment Lives with:: Self   Do you feel safe going back to the place where you live?: Yes          Current home services: DME (walker)    Activities of Daily Living      Permission Sought/Granted                  Emotional Assessment Appearance:: Appears stated age Attitude/Demeanor/Rapport:  (Cooperative) Affect (typically observed): Appropriate, Calm Orientation: : Oriented to Self, Oriented to Place, Oriented to  Time, Oriented to Situation      Admission diagnosis:  Hypovolemic shock (HCC) [R57.1] Confusion [R41.0] Hyperammonemia (HCC) [E72.20] Acute kidney injury superimposed on chronic kidney disease (HCC) [N17.9, N18.9] Acute renal failure superimposed on chronic kidney disease, unspecified acute renal failure type, unspecified CKD stage (HCC) [N17.9, N18.9] Patient Active Problem List   Diagnosis Date Noted   Hypovolemic shock (HCC) 04/17/2024   Seizure disorder (HCC) 04/17/2024   Lewy body dementia (HCC) 04/17/2024   Right leg weakness 04/17/2024   Orthostatic hypotension 03/07/2024   Nondisplaced fracture of left acromial process 03/06/2024   History of opiate overdose treated with  Narcan  infusion 12/2023 03/06/2024   History of seizure 03/06/2024   Sinus bradycardia 03/06/2024   Polypharmacy 01/15/2024   Opioid overdose (HCC) 01/15/2024   Diarrhea 12/30/2023   Hyperammonemia (HCC) 12/30/2023   Weakness 12/30/2023   Acute kidney injury superimposed on CKD (HCC) 12/10/2023   Hypernatremia 12/10/2023   Type 2 diabetes mellitus with chronic kidney disease, without long-term current use of insulin  (HCC) 12/10/2023   Dementia with behavioral disturbance (HCC) 12/10/2023   Moderate Lewy body dementia without behavioral disturbance, psychotic disturbance, mood disturbance, or anxiety (HCC) 05/13/2023   Dehydration 03/05/2023   Acute renal failure superimposed on stage 4 chronic kidney disease (HCC) 03/04/2023   Seizure (HCC) 03/04/2023   Pancytopenia (HCC) 09/06/2022   Hypoglycemia 09/03/2022   Acute metabolic encephalopathy 09/02/2022   Hypotension 09/02/2022   Blepharitis of right lower eyelid 09/02/2022   Dyslipidemia 09/02/2022   Pulmonary hypertension (HCC) 09/02/2022   Depression with anxiety 09/02/2022   GERD without esophagitis 09/02/2022   Lactic acidosis 09/02/2022   Acute kidney injury superimposed on stage IIIb chronic kidney disease (HCC) 09/01/2022   Chronic pain 07/05/2022   Type 2 diabetes mellitus with renal complication (HCC) 07/05/2022   Over weight 07/05/2022   Thrombocytopenia (HCC) 07/05/2022   Fall at  home, initial encounter 07/05/2022   Cellulitis of right hand 07/05/2022   COPD (chronic obstructive pulmonary disease) (HCC) 07/05/2022   Iron deficiency anemia 07/05/2022   Toxic encephalopathy 07/05/2022   Neuropathy 07/05/2022   Right wrist pain    Chronic pain syndrome 01/01/2021   Pharmacologic therapy 01/01/2021   Disorder of skeletal system 01/01/2021   Problems influencing health status 01/01/2021   Aortic atherosclerosis (HCC) 02/24/2020   Other emphysema (HCC) 02/24/2020   Medicare annual wellness visit, initial 02/03/2020    Neck pain 09/15/2018   Sacroiliac joint pain 09/08/2018   Lumbar spondylosis 06/11/2018   Left hip pain 06/11/2018   Chronic pain of both knees 06/11/2018   CKD (chronic kidney disease) stage 4, GFR 15-29 ml/min (HCC) 10/29/2017   Obesity (BMI 30-39.9) 11/27/2015   Tobacco use 06/13/2015   Essential hypertension 06/13/2015   Hyperlipidemia, unspecified 06/13/2015   Osteoarthritis 06/13/2015   Biceps tendinitis 04/06/2015   Epilepsy without status epilepticus, not intractable (HCC) 12/10/2013   PCP:  Alla Amis, MD Pharmacy:   [facility] [phone] - [place], [place] - [address] AT [facility] OF [address] [place] [address] Phone: [phone] Fax: [phone]     Social Drivers of Health (SDOH) Social History: SDOH Screenings   Food Insecurity: Patient Unable To Answer (04/17/2024)  Housing: Unknown (04/17/2024)  Transportation Needs: Patient Unable To Answer (04/17/2024)  Utilities: Patient Unable To Answer (04/17/2024)  Financial Resource Strain: High Risk (10/06/2023)   Received from [facility]  Social Connections: Patient Unable To Answer (04/17/2024)  Tobacco Use: High Risk (04/16/2024)   SDOH Interventions:     Readmission Risk Interventions    12/30/2023   12:01 PM 03/06/2023    3:23 PM  Readmission Risk Prevention Plan  Transportation Screening Complete Complete  PCP or Specialist Appt within 3-5 Days Complete Complete  HRI or Home Care Consult  --  Social Work Consult for Recovery Care Planning/Counseling Complete Complete  Palliative Care Screening Not Applicable Not Applicable  Medication Review Oceanographer) Complete Complete

## 2024-04-20 NOTE — Plan of Care (Signed)

## 2024-04-20 NOTE — Progress Notes (Signed)
 NAME:  Albert Figueroa, MRN:  [id number], DOB:  [birth year], LOS: 3 ADMISSION DATE:  04/16/2024, CONSULTATION DATE:  04/17/2024 REFERRING MD:  Dr. Josette, CHIEF COMPLAINT:  Hypotension   Brief Pt Description / Synopsis:  68 y.o. 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  Course complicated recurrent shock and bradycardia, felt to be combination of hypovolemia, possible medication induced due to polypharmacy +/- Adrenal Insufficiency vs ? Autonomic Dysfunction (given his Lewy Body Dementia).    History of Present Illness:  Albert Figueroa is a 68 y.o. 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  Pt previously hospitalized 04/17 to 04/21 with hypotension/sinus bradycardia attributed to AKI, and opiate overdose requiring narcan  infusion.     ED Course  Upon arrival to the ER CT Head/Cervical Spine negative for acute intracranial abnormality and acute fracture/traumatic malalignment of the cervical spine.  Significant lab results were: K+ 3.3/glucose 118/BUN 49/creatinine 3.49/calcium  8.6/albumin 2.8/total bilirubin 1.6/hgb 9.2/platelet count 52.  CT Abd/Pelvis revealed no acute intra-abdominal or intrapelvic abnormality, but revealed trace simple free fluid ascites likely due to VP shunt.  While in the ER pt received 1L NS bolus and 20 g of lactulose .  Pt initially admitted to the stepdown unit per hospitalist team but remained in the ER pending bed availability.  However, while holding in the ER pt c/o pain and received scheduled 20 mg oxycodone  ir.  He became altered, hypotensive, and bradycardic with pinpoint pupils.  He received  narcan , atropine , and briefly started on levophed  gtt.  PCCM team consulted, however hypotension resolved and levophed  gtt stopped PCCM teamed signed off.  Pt later became hypotensive again with recurrent sinus bradycardia despite aggressive iv fluid resuscitation requiring levophed  gtt.  PCCM team reconsulted.   Please see Significant Hospital Events section below for full detailed hospital course.   Pertinent  Medical History  Arthritis  Type II Diabetes Mellitus  Emphysema/COPD  Epilepsy HLD HTN CKD Stage IV Lewy Body Dementia  Lumbar Stenosis  Spinal Meningitis  CSF Shunt  Seizure Tremors in both Hands  Micro Data:  7/19: SARS-CoV-2/flu/RSV PCR>> negative 7/19: Blood cultures x 2>> no growth to date 7/19: MRSA PCR>> negative 7/19: Respiratory viral panel>> negative  Antimicrobials:   Anti-infectives (From admission, onward)    Start     Dose/Rate Route Frequency Ordered Stop   04/19/24 1000  cefTRIAXone  (ROCEPHIN ) 2 g in sodium chloride  0.9 % 100 mL IVPB  Status:  Discontinued        2 g 200 mL/hr over 30 Minutes Intravenous Every 24 hours 04/18/24 1047 04/19/24 1107   04/18/24 1000  vancomycin  (VANCOREADY) IVPB 750 mg/150 mL        750 mg 150 mL/hr over 60 Minutes Intravenous  Once 04/18/24 0859 04/18/24 1321   04/17/24 1000  cefTRIAXone  (ROCEPHIN ) 2 g in sodium chloride  0.9 % 100 mL IVPB  Status:  Discontinued        2 g 200 mL/hr over 30 Minutes Intravenous Every 12 hours 04/17/24 0803 04/18/24 1047   04/17/24 0900  vancomycin  (VANCOREADY) IVPB 2000 mg/400 mL        2,000 mg 200 mL/hr over 120  Minutes Intravenous  Once 04/17/24 0816 04/17/24 1123   04/17/24 9177  vancomycin  variable dose per unstable renal function (pharmacist dosing)  Status:  Discontinued         Does not apply See admin instructions 04/17/24 0822 04/19/24 1107   04/17/24 0815  ampicillin  (OMNIPEN) 2 g in sodium chloride  0.9 % 100 mL IVPB  Status:  Discontinued        2 g 300 mL/hr over 20  Minutes Intravenous Every 8 hours 04/17/24 0803 04/18/24 1046       Significant Hospital Events: Including procedures, antibiotic start and stop dates in addition to other pertinent events   07/18: Pt admitted to the stepdown unit with hypovolemic shock and acute toxic metabolic encephalopathy, however he remained in the ICU pending bed availability   07/19: Pt developed recurrent hypotension, sinus bradycardia, and worsening acute toxic metabolic encephalopathy requiring levophed  gtt.  Pt transferred to ICU and PCCM team consulted  04/18/24- patient off vasopressors and ionotropes today.  Thrombocytopenia possible due to anti epileptic therapy.  Follows commands but is with encephalopathy.  04/19/24: Again requiring Dopamine  to maintain BP, with attempt to wean, again became hypotensive and bradycardic.  Check cortisol, start stress dose steroids. Consult Cardiology.  04/20/24: No significant events noted overnight.  Remains on Dopamine  ~ able to be weaned off Dopamine  later in the morning.   AKI improving, Creatinine down to 2.1 from 2.4, UOP 800 cc last 24 hrs (net + 3 L).  Interim History / Subjective:  As outlined above under Significant Hospital Events section  Objective   Blood pressure (!) 97/58, pulse 74, temperature 97.6 F (36.4 C), temperature source Oral, resp. rate 15, height 5' 11 (1.803 m), weight 58.7 kg, SpO2 96%.        Intake/Output Summary (Last 24 hours) at 04/20/2024 9257 Last data filed at 04/20/2024 0601 Gross per 24 hour  Intake 777.12 ml  Output 800 ml  Net -22.88 ml   Filed Weights   04/17/24 1105 04/20/24 0500  Weight: 56.7 kg 58.7 kg    Examination: General: Acute on chronically ill-appearing frail elderly male, laying in bed, on room air, no acute distress HENT: Atraumatic, normocephalic, neck supple, no JVD Lungs: Clear diminished breath sounds throughout, even, nonlabored Cardiovascular: Regular rate and rhythm, S1-S2, no murmurs, rubs,  gallops Abdomen: Soft, nontender, nondistended, no guarding roundness, bowel sounds positive in 4 Extremities: Generalized weakness Neuro: Awake and alert, oriented to self and place, moves all extremities commands, no focal deficits noted, pupils PERRL GU: External male catheter in place  Resolved Hospital Problem list     Assessment & Plan:   #Acute toxic metabolic encephalopathy  #Seizure disorder #Chronic pain  #Recurrent falls  Hx: Lewy body dementia, meningitis s/p VP shunt, and epilepsy  -MRI Brain negative for acute intracranial process -UDS and Ethanol is negative -Treatment of metabolic derangements as outlined below -Provide supportive care -Promote normal sleep/wake cycle and family presence -Avoid sedating medications as able -Continue home Valproate  -PT/OT consults  #Multifactorial Shock: hypovolemia and possible medication induced due to polypharmacy +/- Adrenal Insufficiency vs ? Autonomic Dysfunction (given his Lewy Body Dementia) #Sinus bradycardia  Hx: HLD and HTN  -Echocardiogram 04/17/24: LVEF 60-65%, grade I DD, RV systolic function is normal, RV size normal -Continuous cardiac monitoring -Maintain MAP >65 -IV fluids -Vasopressors as needed to maintain MAP goal ~ Weaned off -Continue Midodrine  10 mg TID -Continue Stress Dose Steroids -Lactic acid has normalized -HS Troponin is normal -TSH was  normal -Cardiology following, appreciate input ~ not a good candidate for PPM  #Emphysema/COPD without exacerbation  -Supplemental O2 as needed to maintain O2 sats 88 to 92% -Follow intermittent Chest X-ray & ABG as needed -Bronchodilators  -Pulmonary toilet as able  #Acute kidney injury superimposed on CKD stage IV ~ IMPROVING #Hypernatremia ~ RESOLVED -Monitor I&O's / urinary output -Follow BMP -Ensure adequate renal perfusion -Avoid nephrotoxic agents as able -Replace electrolytes as indicated ~ Pharmacy following for assistance with electrolyte  replacement  #Leukopenia -Monitor fever curve -Trend WBC's & Procalcitonin -Follow cultures as above -Currently holding off on empiric ABX pending cultures & sensitivities -UA without evidence of UTI, Chest X-ray without evidence of pneumonia, abdominal exam is benign, CT abdomen pelvis without acute process  #Acute on Chronic Thrombocytopenia #Macrocytic Anemia ~ no signs of active bleeding -Monitor for S/Sx of bleeding -Trend CBC -SCD's for VTE Prophylaxis (no chemical ppx given thrombocytopenia) -Transfuse for Hgb <7 -Transfuse platelets for platelet count <10 K , <50 K with active bleeding, or <100 K with Neurosurgical procedures/processes  #Type II Diabetes Mellitus -CBG's q4h; Target range of 140 to 180 -SSI -Follow ICU Hypo/Hyperglycemia protocol  #Minimally displaced Acromial Fracture of Left Shoulder -Orthopedics following, appreciate input ~ no treatment or restrictions at this time, continue with PT/OT      Pt is critically ill with multiorgan failure. Prognosis is guarded, high risk for further decompensation, cardiac arrest, and death.  Given current critical illness superimposed on multiple chronic co-morbidities and advanced age, overall long term prognosis is poor.  Pt is DNR/DNI status.  Depending on clinical course, may need to consult Palliative Care to assist with GOC discussions.   Best Practice (right click and Reselect all SmartList Selections daily)   Diet/type: Regular consistency (see orders) DVT prophylaxis: SCD GI prophylaxis: PPI Lines: N/A Foley:  N/A Code Status:  DNR Last date of multidisciplinary goals of care discussion [7/22]  7/22: Will update pt's family when they arrive at bedside on plan of care.  Labs   CBC: Recent Labs  Lab 04/16/24 2210 04/17/24 0633 04/17/24 1354 04/18/24 0433 04/19/24 0530 04/20/24 0426  WBC 4.5 3.1*  3.0*  --  4.9 2.6* 2.7*  NEUTROABS  --  1.3*  --  3.2 1.1*  --   HGB 9.2* 8.9*  8.8*  --  9.8*  8.7* 10.5*  HCT 29.1* 27.0*  27.5* 28.1* 30.6* 26.5* 32.8*  MCV 106.6* 104.2*  106.2*  --  104.1* 103.9* 104.8*  PLT 52* 35*  34*  --  43* 29* 34*    Basic Metabolic Panel: Recent Labs  Lab 04/16/24 2210 04/17/24 0633 04/18/24 0433 04/19/24 0530 04/20/24 0427  NA 145 148* 146* 143 143  K 3.3* 3.6 4.3 3.9 4.3  CL 106 112* 112* 108 108  CO2 28 27 22 26 23   GLUCOSE 118* 88 132* 95 138*  BUN 49* 47* 40* 39* 35*  CREATININE 3.49* 3.07* 2.60* 2.42* 2.19*  CALCIUM  8.6* 7.9* 8.5* 8.6* 8.9  MG  --   --  2.3 2.2  --   PHOS  --   --  4.3 3.0 3.9   GFR: Estimated Creatinine Clearance: 27.2 mL/min (A) (by C-G formula based on SCr of 2.19 mg/dL (H)). Recent Labs  Lab 04/17/24 0633 04/17/24 1141 04/17/24 1354 04/18/24 0433 04/19/24 0530 04/20/24 0426  WBC 3.1*  3.0*  --   --  4.9 2.6* 2.7*  LATICACIDVEN  --  1.5 1.2  --   --   --  Liver Function Tests: Recent Labs  Lab 04/16/24 2210 04/18/24 0433 04/19/24 0530 04/20/24 0427  AST 26  --   --   --   ALT 10  --   --   --   ALKPHOS 53  --   --   --   BILITOT 1.6*  --   --   --   PROT 6.9  --   --   --   ALBUMIN 2.8* 2.5* 2.1* 2.5*   No results for input(s): LIPASE, AMYLASE in the last 168 hours. Recent Labs  Lab 04/16/24 2354  AMMONIA 53*    ABG    Component Value Date/Time   HCO3 30.4 (H) 07/05/2022 1405   O2SAT 89.6 07/05/2022 1405     Coagulation Profile: No results for input(s): INR, PROTIME in the last 168 hours.  Cardiac Enzymes: Recent Labs  Lab 04/17/24 1141  CKTOTAL 174    HbA1C: Hgb A1c MFr Bld  Date/Time Value Ref Range Status  01/16/2024 05:28 AM 4.2 (L) 4.8 - 5.6 % Final    Comment:    (NOTE) Pre diabetes:          5.7%-6.4%  Diabetes:              >6.4%  Glycemic control for   <7.0% adults with diabetes   09/01/2022 06:41 PM 5.0 4.8 - 5.6 % Final    Comment:    (NOTE)         Prediabetes: 5.7 - 6.4         Diabetes: >6.4         Glycemic control for adults with  diabetes: <7.0     CBG: Recent Labs  Lab 04/19/24 1525 04/19/24 1928 04/19/24 2304 04/20/24 0310 04/20/24 0713  GLUCAP 108* 151* 143* 151* 199*    Review of Systems:   Positives in BOLD: Pt reports pain everywhere Gen: Denies fever, chills, weight change, malaise/fatigue, night sweats HEENT: Denies blurred vision, double vision, hearing loss, tinnitus, sinus congestion, rhinorrhea, sore throat, neck stiffness, dysphagia PULM: Denies shortness of breath, cough, sputum production, hemoptysis, wheezing CV: Denies chest pain, edema, orthopnea, paroxysmal nocturnal dyspnea, palpitations GI: Denies abdominal pain, nausea, vomiting, diarrhea, hematochezia, melena, constipation, change in bowel habits GU: Denies dysuria, hematuria, polyuria, oliguria, urethral discharge Endocrine: Denies hot or cold intolerance, polyuria, polyphagia or appetite change Derm: Denies rash, dry skin, scaling or peeling skin change Heme: Denies easy bruising, bleeding, bleeding gums Neuro: Denies headache, numbness, weakness, slurred speech, loss of memory or consciousness   Past Medical History:  He,  has a past medical history of Arthritis, Diabetes mellitus without complication (HCC), Emphysema/COPD (HCC), Epilepsy (HCC), History of brain shunt, Hyperlipidemia, Hypertension, Kidney disease, Lewy body dementia (HCC), Lumbar stenosis, Meningitis spinal, Osteoarthrosis, Seizure (HCC), Stage 4 chronic kidney disease (HCC), and Tremor of both hands.   Surgical History:   Past Surgical History:  Procedure Laterality Date   CATARACT EXTRACTION W/PHACO Right 05/14/2023   Procedure: CATARACT EXTRACTION PHACO AND INTRAOCULAR LENS PLACEMENT (IOC) RIGHT DIABETIC;  Surgeon: Mittie Gaskin, MD;  Location: [facility];  Service: Ophthalmology;  Laterality: Right;  6.70 0:39.8   CATARACT EXTRACTION W/PHACO Left 05/28/2023   Procedure: CATARACT EXTRACTION PHACO AND INTRAOCULAR LENS PLACEMENT (IOC) LEFT  DIABETIC 6.34 00:37.1;  Surgeon: Mittie Gaskin, MD;  Location: [facility];  Service: Ophthalmology;  Laterality: Left;   CSF SHUNT     placed in 1980s   KNEE SURGERY     multiple surgeries on both  knees     Social History:   reports that he has been smoking cigarettes. He has a 96 pack-year smoking history. He has never used smokeless tobacco. He reports that he does not drink alcohol and does not use drugs.   Family History:  His family history includes CAD in his father; Cancer in his mother; Diabetes in his mother.   Allergies Allergies  Allergen Reactions   Aricept  [Donepezil ] Other (See Comments)    SEVERE BRADYCARDIA WITH HEART BLOCK DO NOT RESTART AT DISCHARGE   Gabapentin  Other (See Comments)    Lethargic, Confused   Amlodipine     Other reaction(s): Other (see comments) Caused elevated blood pressure and headaches   Ozempic (0.25 Or 0.5 Mg-Dose) [Semaglutide(0.25 Or 0.5mg -Dos)]     Lethargy.  Also dropped blood sugars too low     Home Medications  Prior to Admission medications   Medication Sig Start Date End Date Taking? Authorizing Provider  acetaminophen  (TYLENOL ) 325 MG tablet Take 2 tablets (650 mg total) by mouth every 6 (six) hours as needed for mild pain (or Fever >/= 101). 09/07/22  Yes Krishnan, Sendil K, MD  albuterol  (VENTOLIN  HFA) 108 (90 Base) MCG/ACT inhaler Inhale into the lungs every 6 (six) hours as needed for wheezing or shortness of breath.   Yes [provider]  cyclobenzaprine  (FEXMID ) 7.5 MG tablet Take 7.5 mg by mouth 3 (three) times daily. 03/08/24  Yes [provider]  dexlansoprazole (DEXILANT) 60 MG capsule Take 60 mg by mouth daily.   Yes [provider]  divalproex  (DEPAKOTE  ER) 500 MG 24 hr tablet Take 1,000 mg by mouth in the morning and at bedtime.   Yes [provider]  donepezil  (ARICEPT  ODT) 10 MG disintegrating tablet Take 10 mg by mouth at bedtime.   Yes [provider]   fenofibrate  160 MG tablet Take 160 mg by mouth daily. 04/25/22  Yes [provider]  ferrous sulfate  325 (65 FE) MG tablet Take 325 mg by mouth daily with breakfast.   Yes [provider]  Fluticasone -Umeclidin-Vilant (TRELEGY ELLIPTA) 100-62.5-25 MCG/ACT AEPB Inhale into the lungs daily.   Yes [provider]  furosemide (LASIX) 20 MG tablet Take 20 mg by mouth daily. 03/19/24  Yes [provider]  hydrOXYzine  (ATARAX ) 10 MG tablet Take 10 mg by mouth 3 (three) times daily as needed.   Yes [provider]  lidocaine  (LIDODERM ) 5 % Place 1 patch onto the skin daily. 04/05/24  Yes [provider]  lisinopril  (ZESTRIL ) 20 MG tablet Take 20 mg by mouth daily. 03/23/24  Yes [provider]  naloxone  (NARCAN ) nasal spray 4 mg/0.1 mL Place 1 spray into the nose once. 11/29/22  Yes [provider]  Oxycodone  HCl 20 MG TABS Take 20 mg by mouth every 4 (four) hours. 02/09/24  Yes [provider]  rosuvastatin  (CRESTOR ) 20 MG tablet Take 20 mg by mouth at bedtime. 04/08/22  Yes [provider]  tiZANidine  (ZANAFLEX ) 4 MG capsule Take 4 mg by mouth every 12 (twelve) hours.   Yes [provider]  dapagliflozin  propanediol (FARXIGA ) 10 MG TABS tablet Take 10 mg by mouth daily. Patient not taking: Reported on 04/17/2024    [provider]  lactulose  (CHRONULAC ) 10 GM/15ML solution Take 30 mLs (20 g total) by mouth 2 (two) times daily. Titrate to 1--2 bowel movements per day Patient not taking: Reported on 04/17/2024 01/01/24   Von Bellis, MD  Roflumilast  250 MCG TABS Take  1 tablet by mouth daily. Patient not taking: Reported on 04/17/2024    [provider]  traZODone  (DESYREL ) 50 MG tablet Take 0.5 tablets (25 mg total) by mouth at bedtime as needed for sleep. Patient not taking: Reported on 04/17/2024 12/24/23   Barbarann Nest, MD     Critical care time: 40 minutes     Inge Lecher,  AGACNP-BC [facility] Prefer epic messenger for cross cover needs If after hours, please call E-link

## 2024-04-20 NOTE — Plan of Care (Signed)
  Problem: Clinical Measurements: Goal: Ability to maintain clinical measurements within normal limits will improve Outcome: Progressing Goal: Cardiovascular complication will be avoided Outcome: Progressing   Problem: Nutrition: Goal: Adequate nutrition will be maintained Outcome: Progressing   Problem: Education: Goal: Knowledge of General Education information will improve Description: Including pain rating scale, medication(s)/side effects and non-pharmacologic comfort measures Outcome: Not Progressing   Problem: Activity: Goal: Risk for activity intolerance will decrease Outcome: Not Progressing   Problem: Coping: Goal: Level of anxiety will decrease Outcome: Not Progressing   Problem: Pain Managment: Goal: General experience of comfort will improve and/or be controlled Outcome: Not Progressing

## 2024-04-20 NOTE — Progress Notes (Signed)
 Rounding Note   Patient Name: Albert Figueroa Date of Encounter: 04/20/2024  [facility] Cardiologist: None   Subjective Patient continues to deny chest pain, shortness of breath, lightheadedness, and dizziness. HR is improved today.   Scheduled Meds:  budesonide -glycopyrrolate -formoterol   2 puff Inhalation BID   Chlorhexidine  Gluconate Cloth  6 each Topical Daily   feeding supplement  237 mL Oral TID BM   hydrocortisone  sod succinate (SOLU-CORTEF ) inj  100 mg Intravenous Q8H   midodrine   10 mg Oral TID WC   multivitamin with minerals  1 tablet Oral Daily   pantoprazole  (PROTONIX ) IV  40 mg Intravenous Q24H   roflumilast   250 mcg Oral Daily   Continuous Infusions:  DOPamine  5 mcg/kg/min (04/20/24 0400)   valproate sodium  500 mg (04/20/24 0601)   PRN Meds: acetaminophen  **OR** acetaminophen , albuterol , hydrOXYzine , magnesium  hydroxide, ondansetron  **OR** ondansetron  (ZOFRAN ) IV, oxyCODONE , traZODone    Vital Signs  Vitals:   04/20/24 0500 04/20/24 0600 04/20/24 0700 04/20/24 0715  BP:  100/65 (!) 86/59 (!) 97/58  Pulse: 65 66 75 74  Resp: 11 15 16 15   Temp:      TempSrc:      SpO2: 100% 100% 100% 96%  Weight: 58.7 kg     Height:        Intake/Output Summary (Last 24 hours) at 04/20/2024 0854 Last data filed at 04/20/2024 0601 Gross per 24 hour  Intake 777.12 ml  Output 800 ml  Net -22.88 ml      04/20/2024    5:00 AM 04/17/2024   11:05 AM 03/06/2024   12:05 AM  Last 3 Weights  Weight (lbs) 129 lb 6.6 oz 125 lb 195 lb 1.6 oz  Weight (kg) 58.7 kg 56.7 kg 88.497 kg      Telemetry Sinus rhythm with PVCs, rate 60-100 bpm - Personally Reviewed  Physical Exam  GEN: No acute distress.   Neck: No JVD Cardiac: RRR, no murmurs, rubs, or gallops.  Respiratory: Diminished throughout GI: Soft, nontender, non-distended  MS: No edema; No deformity. Neuro:  Nonfocal  Psych: Normal affect   Labs High Sensitivity Troponin:   Recent Labs  Lab 04/16/24 2210   TROPONINIHS 6     Chemistry Recent Labs  Lab 04/16/24 2210 04/17/24 0633 04/18/24 0433 04/19/24 0530 04/20/24 0427  NA 145   < > 146* 143 143  K 3.3*   < > 4.3 3.9 4.3  CL 106   < > 112* 108 108  CO2 28   < > 22 26 23   GLUCOSE 118*   < > 132* 95 138*  BUN 49*   < > 40* 39* 35*  CREATININE 3.49*   < > 2.60* 2.42* 2.19*  CALCIUM  8.6*   < > 8.5* 8.6* 8.9  MG  --   --  2.3 2.2  --   PROT 6.9  --   --   --   --   ALBUMIN 2.8*  --  2.5* 2.1* 2.5*  AST 26  --   --   --   --   ALT 10  --   --   --   --   ALKPHOS 53  --   --   --   --   BILITOT 1.6*  --   --   --   --   GFRNONAA 18*   < > 26* 29* 32*  ANIONGAP 11   < > 12 9 12    < > = values in this  interval not displayed.    Lipids No results for input(s): CHOL, TRIG, HDL, LABVLDL, LDLCALC, CHOLHDL in the last 168 hours.  Hematology Recent Labs  Lab 04/18/24 0433 04/19/24 0530 04/20/24 0426  WBC 4.9 2.6* 2.7*  RBC 2.94* 2.55* 3.13*  HGB 9.8* 8.7* 10.5*  HCT 30.6* 26.5* 32.8*  MCV 104.1* 103.9* 104.8*  MCH 33.3 34.1* 33.5  MCHC 32.0 32.8 32.0  RDW 15.0 14.9 14.8  PLT 43* 29* 34*   Thyroid   Recent Labs  Lab 04/17/24 1141  TSH 2.599  FREET4 0.71    BNP Recent Labs  Lab 04/18/24 0433  BNP 420.4*    DDimer  Recent Labs  Lab 04/17/24 1141  DDIMER 1.02*     Radiology  DG Shoulder Left Port Result Date: 04/19/2024 CLINICAL DATA:  [id number] Acromial process of scapula fracture [id number] EXAM: LEFT SHOULDER COMPARISON:  X-ray left shoulder 03/06/2024 FINDINGS: Subacute left acromial fracture with associated periosteal reaction. There is no evidence of acute displaced fracture or dislocation. There is no evidence of arthropathy or other focal bone abnormality. Soft tissues are unremarkable. IMPRESSION: 1. Subacute left acromial fracture with associated periosteal reaction. 2.  No acute displaced fracture or dislocation. Electronically Signed   By: Morgane  Naveau M.D.   On: 04/19/2024 20:19    Cardiac  Studies  04/17/2024 Echo complete 1. TDS.   2. Left ventricular ejection fraction, by estimation, is 60 to 65%. The  left ventricle has normal function. The left ventricle has no regional  wall motion abnormalities. Left ventricular diastolic parameters are  consistent with Grade I diastolic  dysfunction (impaired relaxation).   3. Right ventricular systolic function is normal. The right ventricular  size is normal.   4. The mitral valve is normal in structure. Trivial mitral valve  regurgitation.   5. The aortic valve is normal in structure. Aortic valve regurgitation is  not visualized.  Patient Profile   68 y.o. 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  Assessment & Plan   Sinus bradycardia Hypotension - Appears to have low resting HR at baseline - Patient is relatively asymptomatic to his bradycardia - Echo this admission with normal LVEF and no RWMA - Telemetry reveals sinus bradycardia without evidence of heart block or pauses - Received atropine  in the ED - S/p IV fluids with little improvement - HR improving today after receiving steroids for possible adrenal insufficiency - Remains on dopamine  and midodrine  - Would recommend weaning dopamine  as able - No indication for PPM at this time  Acute toxic metabolic encephalopathy Seizure disorder Chronic pain Recurrent falls - UDS and ethanol negative - Mental status seems to be improving - Ongoing management per CCM  AKI on CKD IV - Cr 3.49 on admission, now 2.19 - Continue to avoid nephrotoxic agents - Ongoing management per CCM  For questions or updates, please contact  [facility] Please consult [email or web address] for contact info under     Signed, Lesley LITTIE Maffucci, PA-C  04/20/2024, 8:54 AM

## 2024-04-21 DIAGNOSIS — N189 Chronic kidney disease, unspecified: Secondary | ICD-10-CM | POA: Diagnosis not present

## 2024-04-21 DIAGNOSIS — R571 Hypovolemic shock: Secondary | ICD-10-CM | POA: Diagnosis not present

## 2024-04-21 DIAGNOSIS — N179 Acute kidney failure, unspecified: Secondary | ICD-10-CM | POA: Diagnosis not present

## 2024-04-21 DIAGNOSIS — I959 Hypotension, unspecified: Secondary | ICD-10-CM | POA: Diagnosis not present

## 2024-04-21 DIAGNOSIS — R001 Bradycardia, unspecified: Secondary | ICD-10-CM | POA: Diagnosis not present

## 2024-04-21 DIAGNOSIS — F02B0 Dementia in other diseases classified elsewhere, moderate, without behavioral disturbance, psychotic disturbance, mood disturbance, and anxiety: Secondary | ICD-10-CM

## 2024-04-21 DIAGNOSIS — R410 Disorientation, unspecified: Secondary | ICD-10-CM

## 2024-04-21 LAB — CBC
HCT: 25.8 % — ABNORMAL LOW (ref 39.0–52.0)
Hemoglobin: 8.2 g/dL — ABNORMAL LOW (ref 13.0–17.0)
MCH: 33.5 pg (ref 26.0–34.0)
MCHC: 31.8 g/dL (ref 30.0–36.0)
MCV: 105.3 fL — ABNORMAL HIGH (ref 80.0–100.0)
Platelets: 26 K/uL — CL (ref 150–400)
RBC: 2.45 MIL/uL — ABNORMAL LOW (ref 4.22–5.81)
RDW: 15.4 % (ref 11.5–15.5)
WBC: 3.1 K/uL — ABNORMAL LOW (ref 4.0–10.5)
nRBC: 0 % (ref 0.0–0.2)

## 2024-04-21 LAB — GLUCOSE, CAPILLARY
Glucose-Capillary: 116 mg/dL — ABNORMAL HIGH (ref 70–99)
Glucose-Capillary: 119 mg/dL — ABNORMAL HIGH (ref 70–99)
Glucose-Capillary: 133 mg/dL — ABNORMAL HIGH (ref 70–99)
Glucose-Capillary: 141 mg/dL — ABNORMAL HIGH (ref 70–99)
Glucose-Capillary: 156 mg/dL — ABNORMAL HIGH (ref 70–99)
Glucose-Capillary: 175 mg/dL — ABNORMAL HIGH (ref 70–99)
Glucose-Capillary: 175 mg/dL — ABNORMAL HIGH (ref 70–99)

## 2024-04-21 LAB — RENAL FUNCTION PANEL
Albumin: 2.2 g/dL — ABNORMAL LOW (ref 3.5–5.0)
Anion gap: 12 (ref 5–15)
BUN: 52 mg/dL — ABNORMAL HIGH (ref 8–23)
CO2: 24 mmol/L (ref 22–32)
Calcium: 8.5 mg/dL — ABNORMAL LOW (ref 8.9–10.3)
Chloride: 108 mmol/L (ref 98–111)
Creatinine, Ser: 2.35 mg/dL — ABNORMAL HIGH (ref 0.61–1.24)
GFR, Estimated: 30 mL/min — ABNORMAL LOW (ref >60)
Glucose, Bld: 157 mg/dL — ABNORMAL HIGH (ref 70–99)
Phosphorus: 3.8 mg/dL (ref 2.5–4.6)
Potassium: 4 mmol/L (ref 3.5–5.1)
Sodium: 144 mmol/L (ref 135–145)

## 2024-04-21 MED ORDER — DIVALPROEX SODIUM ER 500 MG PO TB24
1000.0000 mg | ORAL_TABLET | Freq: Two times a day (BID) | ORAL | Status: DC
  Administered 2024-04-22 – 2024-04-28 (×12): 1000 mg via ORAL
  Filled 2024-04-21 (×14): qty 2

## 2024-04-21 MED ORDER — HYDROCORTISONE SOD SUC (PF) 100 MG IJ SOLR
50.0000 mg | Freq: Three times a day (TID) | INTRAMUSCULAR | Status: AC
  Administered 2024-04-21 – 2024-04-22 (×4): 50 mg via INTRAVENOUS
  Filled 2024-04-21 (×5): qty 1

## 2024-04-21 MED ORDER — HALOPERIDOL LACTATE 5 MG/ML IJ SOLN
5.0000 mg | Freq: Four times a day (QID) | INTRAMUSCULAR | Status: DC | PRN
  Administered 2024-04-21 – 2024-04-27 (×6): 5 mg via INTRAVENOUS
  Filled 2024-04-21 (×6): qty 1

## 2024-04-21 NOTE — Progress Notes (Addendum)
 Progress Note   Patient: Albert Figueroa FMW:[id number] DOB: Aug 24, 1956 DOA: 04/16/2024     4 DOS: the patient was seen and examined on 04/21/2024   Brief hospital course:  Albert Figueroa is a 68 y.o. 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  Pt previously hospitalized 04/17 to 04/21 with hypotension/sinus bradycardia attributed to AKI, and opiate overdose requiring narcan  infusion.     ED Course  Upon arrival to the ER CT Head/Cervical Spine negative for acute intracranial abnormality and acute fracture/traumatic malalignment of the cervical spine.  Significant lab results were: K+ 3.3/glucose 118/BUN 49/creatinine 3.49/calcium  8.6/albumin 2.8/total bilirubin 1.6/hgb 9.2/platelet count 52.  CT Abd/Pelvis revealed no acute intra-abdominal or intrapelvic abnormality, but revealed trace simple free fluid ascites likely due to VP shunt.  While in the ER pt received 1L NS bolus and 20 g of lactulose .  Pt initially admitted to the stepdown unit per hospitalist team but remained in the ER pending bed availability.  However, while holding in the ER pt c/o pain and received scheduled 20 mg oxycodone  ir.  He became altered, hypotensive, and bradycardic with pinpoint pupils.  He received narcan , atropine , and briefly started on levophed  gtt.   Patient was admitted to stepdown unit with PCCM consulted.  BP's improved, but later required pressors again and PCCM was re-consulted and treated with Dopamine , stress dose IV steroids.  Cardiology was consulted.  Summary of significant hospital events: 07/18: Pt admitted to the stepdown unit with hypovolemic shock and acute toxic metabolic encephalopathy   07/19: Pt developed recurrent hypotension, sinus bradycardia, and worsening acute toxic metabolic  encephalopathy requiring levophed  gtt.  Pt transferred to ICU and PCCM team consulted  04/18/24- patient off vasopressors and ionotropes today.  Thrombocytopenia possible due to anti epileptic therapy.  Follows commands but is with encephalopathy.  04/19/24: Again requiring Dopamine  to maintain BP, with attempt to wean, again became hypotensive and bradycardic.  Check cortisol, start stress dose steroids. Consult Cardiology.  04/20/24: No significant events noted overnight.  Remains on Dopamine  ~ able to be weaned off Dopamine  later in the morning.   AKI improving, Creatinine down to 2.1 from 2.4, UOP 800 cc last 24 hrs (net + 3 L).   I assumed care from PCCM on 7/23, pt weaned off vasopressors with fairly stable BP's on midodrine  and stress dose steroids.   Assessment and Plan:  Acute toxic metabolic encephalopathy  Hx of Seizure disorder Hx of Chronic pain  Recurrent falls  Hx: Lewy body dementia, meningitis s/p VP shunt, and epilepsy  MRI Brain negative for acute intracranial process UDS and Ethanol is negative --Mgmt of underlying conditions as outlined --Delirium precautions --Avoid sedating medications as much as possible --Continue home Valproate -- change IV back to PO today --PT/OT evaluations -- SNF recommended   Multifactorial Shock: hypovolemia and possible medication induced due to polypharmacy +/- Adrenal Insufficiency vs ? Autonomic Dysfunction (given his Lewy Body Dementia) Sinus bradycardia  Hx: HLD and HTN  Echocardiogram 04/17/24: LVEF 60-65%, grade I DD, RV systolic function is normal, RV size normal Lactic acid has normalized HS Troponin is normal TSH was normal --Telemetry --Maintain MAP >65 --Continue midodrine  --IV stress dose steroids -- reduce from 100 >> 50 mg TID & gradually wean --Consider Florinef if needed --Cardiology  following, appreciate input ~ not a good candidate for PPM   Emphysema/COPD without exacerbation  -Supplemental O2 as needed to  maintain O2 sats 88 to 92% -Follow intermittent Chest X-ray & ABG as needed -Bronchodilators  -Pulmonary toilet as able   Acute kidney injury superimposed on CKD stage IV ~ IMPROVING Hypernatremia ~ RESOLVED -Monitor I&O's / urinary output -Follow BMP -Ensure adequate renal perfusion -Avoid nephrotoxic agents as able -Replace electrolytes as indicated ~ Pharmacy following for assistance with electrolyte replacement   Leukopenia UA without evidence of UTI, Chest X-ray without evidence of pneumonia, abdominal exam is benign, CT abdomen pelvis without acute process --Monitor fever curve, CBC --Follow cultures  --Hold off empiric ABX pending cultures & sensitivities --Monitor clinically for s/sx's of infection   Acute on Chronic Thrombocytopenia Macrocytic Anemia ~ no signs of active bleeding -Monitor for S/Sx of bleeding -Trend CBC -SCD's for VTE Prophylaxis (no chemical ppx given thrombocytopenia) -Transfuse for Hgb <7 -Transfuse platelets for platelet count <10 K , <50 K with active bleeding, or <100 K with Neurosurgical procedures/processes   Type II Diabetes Mellitus -CBG's q4h; Target range of 140 to 180 -Sliding scale Novolog    Minimally displaced Acromial Fracture of Left Shoulder -Orthopedics following, appreciate input ~ no treatment or restrictions at this time, continue with PT/OT   Prognosis -- pt's short-term prognosis remains guarded and high risk for clinical decompensation, cardiac arrest and death.  Given superimposed multiple other co-morbid conditions, long term prognosis overall is poor.  Code status DNR/DNI. --Will consider Palliative Care consultation for GOC discussions pending clinical course      Subjective: Pt seen awake resting in bed.  Says not doing well today, mentions back pain that is chronic but worse since being in the hospital.  He endorses intermittent dizziness on standing chronically at baseline.  He asks if I can send him home today,  stating his daughter-in-law was killed in a car accident today.  No family present to confirm accuracy of this, and note pt has been very confused since admission, also history of dementia.  No acute events reported from nursing staff.  This afternoon, I was notified pt became agitated and somewhat aggressive, required medication to calm down for safety of pt and staff.   Physical Exam: Vitals:   04/20/24 2349 04/21/24 0536 04/21/24 0828 04/21/24 1111  BP: (!) 122/59 102/61 (!) 78/65 110/61  Pulse: (!) 51 (!) 48 (!) 56 (!) 48  Resp: 20 18 16 16   Temp: 97.8 F (36.6 C) 97.7 F (36.5 C) (!) 97.4 F (36.3 C) 98 F (36.7 C)  TempSrc: Oral     SpO2: 100%  92% 98%  Weight:  86.2 kg    Height:       General exam: awake, alert, no acute distress, chronically ill appearing HEENT: moist mucus membranes, hearing grossly normal  Respiratory system: CTAB, no wheezes, rales or rhonchi, normal respiratory effort. Cardiovascular system: normal S1/S2, RRR, no pedal edema.   Gastrointestinal system: soft, NT, ND Central nervous system: A&O x self and hospital. no gross focal neurologic deficits, normal speech Extremities: moves all, no edema, normal tone Skin: dry, intact, normal temperature Psychiatry: anxious mood, congruent affect, confused   Data Reviewed:  Notable labs --   Family Communication: None present. Will attempt to call this afternoon.  Disposition: Status is: Inpatient Remains inpatient appropriate because: severity of illness as above, remains on IV therapies and with persistent encephalopathy   Planned Discharge Destination: Skilled nursing facility  Time spent: 45 minutes  Author: Burnard DELENA Cunning, DO 04/21/2024 3:32 PM  For on call review [email or web address].

## 2024-04-21 NOTE — Progress Notes (Signed)
 Rounding Note   Patient Name: Albert Figueroa Date of Encounter: 04/21/2024  [facility] Cardiologist: Dr. Lonni End  Subjective Reports he feels well Spilled breakfast on himself this morning States his niece died 30 minutes ago in a car accident, she is 67 years old, likely speeding as she likes to drive fast He does not remember her name He has no other complaints  Scheduled Meds:  budesonide -glycopyrrolate -formoterol   2 puff Inhalation BID   Chlorhexidine  Gluconate Cloth  6 each Topical Daily   divalproex   1,000 mg Oral BID   feeding supplement  237 mL Oral TID BM   hydrocortisone  sod succinate (SOLU-CORTEF ) inj  50 mg Intravenous Q8H   midodrine   10 mg Oral TID WC   multivitamin with minerals  1 tablet Oral Daily   pantoprazole  (PROTONIX ) IV  40 mg Intravenous Q24H   roflumilast   250 mcg Oral Daily   Continuous Infusions:  valproate sodium  500 mg (04/21/24 0601)   PRN Meds: acetaminophen  **OR** acetaminophen , albuterol , haloperidol  lactate, hydrOXYzine , magnesium  hydroxide, ondansetron  **OR** ondansetron  (ZOFRAN ) IV, oxyCODONE , traZODone    Vital Signs  Vitals:   04/20/24 2349 04/21/24 0536 04/21/24 0828 04/21/24 1111  BP: (!) 122/59 102/61 (!) 78/65 110/61  Pulse: (!) 51 (!) 48 (!) 56 (!) 48  Resp: 20 18 16 16   Temp: 97.8 F (36.6 C) 97.7 F (36.5 C) (!) 97.4 F (36.3 C) 98 F (36.7 C)  TempSrc: Oral     SpO2: 100%  92% 98%  Weight:  86.2 kg    Height:        Intake/Output Summary (Last 24 hours) at 04/21/2024 1327 Last data filed at 04/21/2024 0900 Gross per 24 hour  Intake 718 ml  Output 550 ml  Net 168 ml      04/21/2024    5:36 AM 04/20/2024    5:00 AM 04/17/2024   11:05 AM  Last 3 Weights  Weight (lbs) 190 lb 0.6 oz 129 lb 6.6 oz 125 lb  Weight (kg) 86.2 kg 58.7 kg 56.7 kg      Telemetry Normal sinus rhythm- Personally Reviewed  ECG   - Personally Reviewed  Physical Exam  GEN: No acute distress.   Neck: No JVD Cardiac:  RRR, no murmurs, rubs, or gallops.  Respiratory: Clear to auscultation bilaterally. GI: Soft, nontender, non-distended  MS: No edema; No deformity. Neuro:  Nonfocal  Psych: Confused  Labs High Sensitivity Troponin:   Recent Labs  Lab 04/16/24 2210  TROPONINIHS 6     Chemistry Recent Labs  Lab 04/16/24 2210 04/17/24 0633 04/18/24 0433 04/19/24 0530 04/20/24 0427 04/21/24 0259  NA 145   < > 146* 143 143 144  K 3.3*   < > 4.3 3.9 4.3 4.0  CL 106   < > 112* 108 108 108  CO2 28   < > 22 26 23 24   GLUCOSE 118*   < > 132* 95 138* 157*  BUN 49*   < > 40* 39* 35* 52*  CREATININE 3.49*   < > 2.60* 2.42* 2.19* 2.35*  CALCIUM  8.6*   < > 8.5* 8.6* 8.9 8.5*  MG  --   --  2.3 2.2  --   --   PROT 6.9  --   --   --   --   --   ALBUMIN 2.8*  --  2.5* 2.1* 2.5* 2.2*  AST 26  --   --   --   --   --  ALT 10  --   --   --   --   --   ALKPHOS 53  --   --   --   --   --   BILITOT 1.6*  --   --   --   --   --   GFRNONAA 18*   < > 26* 29* 32* 30*  ANIONGAP 11   < > 12 9 12 12    < > = values in this interval not displayed.    Lipids No results for input(s): CHOL, TRIG, HDL, LABVLDL, LDLCALC, CHOLHDL in the last 168 hours.  Hematology Recent Labs  Lab 04/19/24 0530 04/20/24 0426 04/21/24 0259  WBC 2.6* 2.7* 3.1*  RBC 2.55* 3.13* 2.45*  HGB 8.7* 10.5* 8.2*  HCT 26.5* 32.8* 25.8*  MCV 103.9* 104.8* 105.3*  MCH 34.1* 33.5 33.5  MCHC 32.8 32.0 31.8  RDW 14.9 14.8 15.4  PLT 29* 34* 26*   Thyroid   Recent Labs  Lab 04/17/24 1141  TSH 2.599  FREET4 0.71    BNP Recent Labs  Lab 04/18/24 0433  BNP 420.4*    DDimer  Recent Labs  Lab 04/17/24 1141  DDIMER 1.02*     Radiology  DG Shoulder Left Port Result Date: 04/19/2024 CLINICAL DATA:  [id number] Acromial process of scapula fracture [id number] EXAM: LEFT SHOULDER COMPARISON:  X-ray left shoulder 03/06/2024 FINDINGS: Subacute left acromial fracture with associated periosteal reaction. There is no evidence of acute  displaced fracture or dislocation. There is no evidence of arthropathy or other focal bone abnormality. Soft tissues are unremarkable. IMPRESSION: 1. Subacute left acromial fracture with associated periosteal reaction. 2.  No acute displaced fracture or dislocation. Electronically Signed   By: Morgane  Naveau M.D.   On: 04/19/2024 20:19    Cardiac Studies Echo Left ventricular ejection fraction, by estimation, is 60 to 65%. The  left ventricle has normal function. The left ventricle has no regional  wall motion abnormalities. Left ventricular diastolic parameters are  consistent with Grade I diastolic  dysfunction (impaired relaxation).   3. Right ventricular systolic function is normal. The right ventricular  size is normal.   4. The mitral valve is normal in structure. Trivial mitral valve  regurgitation.   5. The aortic valve is normal in structure. Aortic valve regurgitation is  not visualized.   Patient Profile   Mr. 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   Assessment & Plan   Sinus bradycardia Hypotension -Remains bradycardic with rates in the 50s, asymptomatic -Blood pressure supported with midodrine  10 3 times daily Has been treated with steroids for adrenal insufficiency -Would recommend he sit up in chair for meals - Does not appear very symptomatic from his hypotension -For symptomatic hypotension, could consider adding low-dose fludrocortisone daily   Acute toxic metabolic encephalopathy Seizure disorder Chronic pain Recurrent falls - UDS and ethanol negative -Somewhat confused this morning, may be his baseline   AKI on CKD IV - Cr 3.49 on admission, now 2.35 Appears to be still above his baseline 1.8   Acute on chronic  thrombocytopenia Appears to have pancytopenia Needs outpatient hematology   For questions or updates, please contact [facility] Please consult [email or web address] for contact info under     Signed, Zaine Elsass, MD  04/21/2024, 1:27 PM

## 2024-04-21 NOTE — Progress Notes (Signed)
 PT Cancellation Note  Patient Details Name: Albert Figueroa MRN: [id number] DOB: 29-Sep-1956   Cancelled Treatment:    Reason Eval/Treat Not Completed: Medical issues which prohibited therapy Patient has low BP this am, RN just gave medication. Will hold at this time and re-attempt later today.  Anapaola Kinsel 04/21/2024, 10:04 AM

## 2024-04-21 NOTE — Progress Notes (Signed)
 Occupational Therapy Treatment Patient Details Name: Albert Figueroa MRN: [id number] DOB: 01-Jan-1956 Today's Date: 04/21/2024   History of present illness Pt is a 68 y.o. male who presented to the ER with acute onset of generalized weakness and concern for dehydration with recent recurrent falls. Admitted for management of  Hypovolemic shock, acute metabolic encephalopathy, and sinus bradycardia. 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 Easily arousable and agreeable to PT/OT co-tx session. He denies pain. Pt performed bed mobility with Min/CGA mostly for cueing and initiation of tasks. Mild dizziness reported on transition, however BP with increase in readings. Pt required Min A x2 for STS from EOB at lowest height with ability to stand for extended time, but continues to move his RUE and unable to get accurate BP reading. Pt attempted lateral steps to recliner, but was unable to advance and wished to return to seated EOB. BP stable, retrieved stedy to perform transfer to recliner to maximize upright positioning. He required CGA x2 for STS to stedy with cueing for hand/feet placement, able to stand at stedy with CGA-tolerated well. Pt seated in recliner with lunch tray in front and was perseverating on wanting to smoke and needing a lighter. Redirected him to eat his lunch with alarm in place. He will cont to require skilled acute OT services to maximize his safety and IND to return to PLOF.       If plan is discharge home, recommend the following:  A lot of help with walking and/or transfers;A lot of help with bathing/dressing/bathroom;Assistance with cooking/housework;Direct supervision/assist for financial management;Supervision due to cognitive status;Help with stairs  or ramp for entrance;Direct supervision/assist for medications management;Assist for transportation   Equipment Recommendations  Other (comment) (defer)    Recommendations for Other Services      Precautions / Restrictions Precautions Precautions: Fall Recall of Precautions/Restrictions: Impaired Precaution/Restrictions Comments: dizzy, hypotensive/orthostatic Restrictions Weight Bearing Restrictions Per Provider Order: No Other Position/Activity Restrictions: no restrictions from previous L acromial fx per ortho       Mobility Bed Mobility Overal bed mobility: Needs Assistance Bed Mobility: Supine to Sit     Supine to sit: Contact guard, Min assist     General bed mobility comments: CGA with cueing to reach EOB safely and sequence/initiate tasks    Transfers Overall transfer level: Needs assistance   Transfers: Sit to/from Stand, Bed to chair/wheelchair/BSC Sit to Stand: Min assist, +2 safety/equipment, Via lift equipment           General transfer comment: Min A x2 for initial STS from EOB with pt unable to take steps toward recliner; returned to sitting and used stedy with increased cueing for hand/feet placement and pulling on bar to stand, able to stand with CGA and be set up in recliner Transfer via Lift Equipment: Stedy   Balance Overall balance assessment: Needs assistance Sitting-balance support: Feet supported, Single extremity supported Sitting balance-Leahy Scale: Good     Standing balance support: Bilateral upper extremity supported Standing balance-Leahy Scale: Fair Standing balance comment: with use of stedy                           ADL either performed or assessed with clinical judgement   ADL Overall ADL's : Needs assistance/impaired Eating/Feeding:  Set up;Sitting Eating/Feeding Details (indicate cue type and reason): cues for initiation                       Toilet Transfer Details (indicate cue type and reason):  simulated to recliner requiring use of stedy for safety d/t inability to take lateral steps                Extremity/Trunk Assessment              Vision       Perception     Praxis     Communication Communication Communication: Impaired Factors Affecting Communication: Reduced clarity of speech;Hearing impaired   Cognition Arousal: Alert Behavior During Therapy: Impulsive                                 Following commands: Impaired Following commands impaired: Follows one step commands inconsistently, Follows one step commands with increased time      Cueing   Cueing Techniques: Verbal cues, Tactile cues, Visual cues  Exercises      Shoulder Instructions       General Comments monitored BP throughout session-did not drop from supine to sit; dropped very minimally with stand after increased time and returned to supine number once seated in recliner    Pertinent Vitals/ Pain       Pain Assessment Pain Assessment: No/denies pain Pain Intervention(s): Monitored during session  Home Living                                          Prior Functioning/Environment              Frequency  Min 2X/week        Progress Toward Goals  OT Goals(current goals can now be found in the care plan section)  Progress towards OT goals: Progressing toward goals  Acute Rehab OT Goals Patient Stated Goal: improve function and BP OT Goal Formulation: With patient Time For Goal Achievement: 05/02/24 Potential to Achieve Goals: Fair  Plan      Co-evaluation    PT/OT/SLP Co-Evaluation/Treatment: Yes Reason for Co-Treatment: Complexity of the patient's impairments (multi-system involvement);To address functional/ADL transfers PT goals addressed during session: Mobility/safety with mobility;Balance OT goals addressed during session: ADL's and self-care      AM-PAC OT 6 Clicks Daily Activity     Outcome Measure   Help from  another person eating meals?: None Help from another person taking care of personal grooming?: None Help from another person toileting, which includes using toliet, bedpan, or urinal?: A Lot Help from another person bathing (including washing, rinsing, drying)?: A Lot Help from another person to put on and taking off regular upper body clothing?: A Little Help from another person to put on and taking off regular lower body clothing?: A Lot 6 Click Score: 17    End of Session Equipment Utilized During Treatment: Other (comment) (stedy)  OT Visit Diagnosis: Other abnormalities of gait and mobility (R26.89);Muscle weakness (generalized) (M62.81)   Activity Tolerance Patient tolerated treatment well   Patient Left in chair;with call bell/phone within reach;with chair alarm set   Nurse Communication Mobility status        Time: 8743-8677 OT Time Calculation (min): 26 min  Charges: OT General Charges $OT Visit: 1 Visit OT  Treatments $Therapeutic Activity: 8-22 mins  [facility], OTR/L  04/21/24, 2:44 PM   Zamar Odwyer E Jenne Sellinger 04/21/2024, 2:38 PM

## 2024-04-21 NOTE — Plan of Care (Signed)

## 2024-04-21 NOTE — Progress Notes (Signed)
 Physical Therapy Treatment Patient Details Name: Albert Figueroa MRN: [id number] DOB: 10-24-1955 Today's Date: 04/21/2024   History of Present Illness Pt is a 68 y.o. male who presented to the ER with acute onset of generalized weakness and concern for dehydration with recent recurrent falls. Admitted for management of  Hypovolemic shock, acute metabolic encephalopathy, and sinus bradycardia. 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 Today's tx session involved continuation of transfer work with use of stedy to improve pt's activity tolerance. Pt's vitals monitored throughout session, see below for numbers. STS trial 1 successful with x2 HHA, no LOB, pt unable to take lateral steps toward recliner. Second attempt performed with stedy machine this date, successful with supervision/ CGA, pt able to pull himself up with verbal and tactile cueing. Pt left in recliner to try and improve OH. PT to follow acutely as appropriate.   BP: -Supine: 88/73 (79), 59 HR -Seated: 97/59 (71), 65 HR , 110/61, 48 HR -unable to get standing  -Seated in recliner: 84/73 (79), 70 HR      If plan is discharge home, recommend the following: A lot of help with walking and/or transfers;A lot of help with bathing/dressing/bathroom   Can travel by private vehicle     No  Equipment Recommendations  None recommended by PT    Recommendations for Other Services       Precautions / Restrictions Precautions Precautions: Fall Recall of Precautions/Restrictions: Impaired Restrictions Weight Bearing Restrictions Per Provider Order: No     Mobility  Bed Mobility Overal bed mobility: Needs Assistance Bed Mobility: Supine to Sit     Supine to sit: Contact guard, Min assist     General bed mobility  comments: CGA to min a d/t verbal and tactile cueing, BP taken in supine resulting in 88/73 (79), pt asymptomatic therefore continued with transition into seated    Transfers Overall transfer level: Needs assistance Equipment used: 2 person hand held assist Transfers: Sit to/from Stand, Bed to chair/wheelchair/BSC Sit to Stand: Min assist, +2 safety/equipment, Via lift equipment           General transfer comment: x1 trial of STS successful with x2 HHA, no LOB, pt unable to take step towards recliner therefore sat down, second trial with stedy and successful, supervision a/CGA with verbal and tactile cues on hand placement, left in recliner and RN notified of lift device Transfer via Lift Equipment: Stedy  Ambulation/Gait                   Stairs             Wheelchair Mobility     Tilt Bed    Modified Rankin (Stroke Patients Only)       Balance Overall balance assessment: Needs assistance Sitting-balance support: Feet supported, Single extremity supported Sitting balance-Leahy Scale: Good Sitting balance - Comments: sitting EOB   Standing balance support: Bilateral upper extremity supported Standing balance-Leahy Scale: Fair Standing balance comment: with use of stedy                            Communication Communication Communication: Impaired Factors Affecting Communication: Reduced clarity of speech;Hearing impaired  Cognition Arousal: Alert Behavior During Therapy: Impulsive   PT - Cognitive impairments: No family/caregiver present  to determine baseline                       PT - Cognition Comments: pt continues to be confused Following commands: Impaired Following commands impaired: Follows one step commands inconsistently, Follows one step commands with increased time    Cueing Cueing Techniques: Verbal cues, Tactile cues, Visual cues  Exercises      General Comments        Pertinent Vitals/Pain Pain  Assessment Pain Assessment: No/denies pain    Home Living                          Prior Function            PT Goals (current goals can now be found in the care plan section) Acute Rehab PT Goals Patient Stated Goal: return home PT Goal Formulation: Patient unable to participate in goal setting Time For Goal Achievement: 05/02/24 Potential to Achieve Goals: Fair Progress towards PT goals: Progressing toward goals    Frequency    Min 2X/week      PT Plan      Co-evaluation PT/OT/SLP Co-Evaluation/Treatment: Yes Reason for Co-Treatment: Complexity of the patient's impairments (multi-system involvement);To address functional/ADL transfers PT goals addressed during session: Mobility/safety with mobility;Balance        AM-PAC PT 6 Clicks Mobility   Outcome Measure  Help needed turning from your back to your side while in a flat bed without using bedrails?: A Little Help needed moving from lying on your back to sitting on the side of a flat bed without using bedrails?: A Little Help needed moving to and from a bed to a chair (including a wheelchair)?: A Lot Help needed standing up from a chair using your arms (e.g., wheelchair or bedside chair)?: A Lot Help needed to walk in hospital room?: Total Help needed climbing 3-5 steps with a railing? : Total 6 Click Score: 12    End of Session   Activity Tolerance: Patient tolerated treatment well Patient left: in chair;with call bell/phone within reach;with chair alarm set Nurse Communication: Mobility status;Need for lift equipment PT Visit Diagnosis: Difficulty in walking, not elsewhere classified (R26.2);Muscle weakness (generalized) (M62.81)     Time: 8743-8677 PT Time Calculation (min) (ACUTE ONLY): 26 min  Charges:                              Albert Figueroa, SPT  04/21/2024, 2:15 PM

## 2024-04-21 NOTE — Progress Notes (Signed)
 Lab called  for a critical platelet of 26. MD Fausto made aware. Incoming shift made aware.

## 2024-04-21 NOTE — Plan of Care (Signed)
   Problem: Clinical Measurements: Goal: Will remain free from infection Outcome: Progressing   Problem: Pain Managment: Goal: General experience of comfort will improve and/or be controlled Outcome: Progressing   Problem: Safety: Goal: Ability to remain free from injury will improve Outcome: Progressing

## 2024-04-22 DIAGNOSIS — N179 Acute kidney failure, unspecified: Secondary | ICD-10-CM | POA: Diagnosis not present

## 2024-04-22 DIAGNOSIS — R001 Bradycardia, unspecified: Secondary | ICD-10-CM | POA: Diagnosis not present

## 2024-04-22 DIAGNOSIS — N189 Chronic kidney disease, unspecified: Secondary | ICD-10-CM | POA: Diagnosis not present

## 2024-04-22 DIAGNOSIS — R571 Hypovolemic shock: Secondary | ICD-10-CM | POA: Diagnosis not present

## 2024-04-22 DIAGNOSIS — I959 Hypotension, unspecified: Secondary | ICD-10-CM | POA: Diagnosis not present

## 2024-04-22 LAB — CBC
HCT: 27.3 % — ABNORMAL LOW (ref 39.0–52.0)
Hemoglobin: 8.9 g/dL — ABNORMAL LOW (ref 13.0–17.0)
MCH: 33.8 pg (ref 26.0–34.0)
MCHC: 32.6 g/dL (ref 30.0–36.0)
MCV: 103.8 fL — ABNORMAL HIGH (ref 80.0–100.0)
Platelets: 26 K/uL — CL (ref 150–400)
RBC: 2.63 MIL/uL — ABNORMAL LOW (ref 4.22–5.81)
RDW: 15.5 % (ref 11.5–15.5)
WBC: 2.7 K/uL — ABNORMAL LOW (ref 4.0–10.5)
nRBC: 0 % (ref 0.0–0.2)

## 2024-04-22 LAB — GLUCOSE, CAPILLARY
Glucose-Capillary: 104 mg/dL — ABNORMAL HIGH (ref 70–99)
Glucose-Capillary: 140 mg/dL — ABNORMAL HIGH (ref 70–99)
Glucose-Capillary: 143 mg/dL — ABNORMAL HIGH (ref 70–99)
Glucose-Capillary: 165 mg/dL — ABNORMAL HIGH (ref 70–99)
Glucose-Capillary: 169 mg/dL — ABNORMAL HIGH (ref 70–99)
Glucose-Capillary: 214 mg/dL — ABNORMAL HIGH (ref 70–99)

## 2024-04-22 LAB — CULTURE, BLOOD (ROUTINE X 2)
Culture: NO GROWTH
Culture: NO GROWTH

## 2024-04-22 LAB — RENAL FUNCTION PANEL
Albumin: 2.4 g/dL — ABNORMAL LOW (ref 3.5–5.0)
Anion gap: 8 (ref 5–15)
BUN: 53 mg/dL — ABNORMAL HIGH (ref 8–23)
CO2: 26 mmol/L (ref 22–32)
Calcium: 8.7 mg/dL — ABNORMAL LOW (ref 8.9–10.3)
Chloride: 107 mmol/L (ref 98–111)
Creatinine, Ser: 1.83 mg/dL — ABNORMAL HIGH (ref 0.61–1.24)
GFR, Estimated: 40 mL/min — ABNORMAL LOW (ref >60)
Glucose, Bld: 121 mg/dL — ABNORMAL HIGH (ref 70–99)
Phosphorus: 3.6 mg/dL (ref 2.5–4.6)
Potassium: 4.2 mmol/L (ref 3.5–5.1)
Sodium: 141 mmol/L (ref 135–145)

## 2024-04-22 LAB — MAGNESIUM: Magnesium: 2.7 mg/dL — ABNORMAL HIGH (ref 1.7–2.4)

## 2024-04-22 NOTE — Plan of Care (Signed)

## 2024-04-22 NOTE — Progress Notes (Signed)
  Progress Note  Patient Name: Albert Figueroa Date of Encounter: 04/22/2024 [facility] Cardiologist: Dr. Mady  Interval Summary    BP 114/71, and pulse in the 50s. Scr 2.35>1.87. Patient reports he is wanting to go home. Plan for d/c to SNF.  Vital Signs Vitals:   04/21/24 2347 04/22/24 0352 04/22/24 0503 04/22/24 0730  BP: 114/61 (!) 118/54  114/71  Pulse: (!) 53 (!) 53  (!) 48  Resp: 18 18  18   Temp: 97.9 F (36.6 C) 97.8 F (36.6 C)  98.8 F (37.1 C)  TempSrc: Oral     SpO2: 98% 96%  100%  Weight:   83.4 kg   Height:        Intake/Output Summary (Last 24 hours) at 04/22/2024 0805 Last data filed at 04/21/2024 2100 Gross per 24 hour  Intake 720 ml  Output 800 ml  Net -80 ml      04/22/2024    5:03 AM 04/21/2024    5:36 AM 04/20/2024    5:00 AM  Last 3 Weights  Weight (lbs) 183 lb 13.8 oz 190 lb 0.6 oz 129 lb 6.6 oz  Weight (kg) 83.4 kg 86.2 kg 58.7 kg      Telemetry/ECG  SB HR 50s - Personally Reviewed  Physical Exam  GEN: No acute distress.   Neck: No JVD Cardiac: RRR, no murmurs, rubs, or gallops.  Respiratory: Clear to auscultation bilaterally. GI: Soft, nontender, non-distended  MS: No edema  Cardiac Studies Echo Left ventricular ejection fraction, by estimation, is 60 to 65%. The  left ventricle has normal function. The left ventricle has no regional  wall motion abnormalities. Left ventricular diastolic parameters are  consistent with Grade I diastolic  dysfunction (impaired relaxation).   3. Right ventricular systolic function is normal. The right ventricular  size is normal.   4. The mitral valve is normal in structure. Trivial mitral valve  regurgitation.   5. The aortic valve is normal in structure. Aortic valve regurgitation is  not visualized.    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   Assessment & Plan   Sinus bradycardia - patient remains bradycardia with rates in the 50s - echo showed LVEF 60-65%, G1DD.  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

## 2024-04-22 NOTE — Progress Notes (Signed)
 Progress Note   Patient: Albert Figueroa FMW:[id number] DOB: Mar 25, 1956 DOA: 04/16/2024     5 DOS: the patient was seen and examined on 04/22/2024   Brief hospital course:  Albert Figueroa is a 68 y.o. 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  Pt previously hospitalized 04/17 to 04/21 with hypotension/sinus bradycardia attributed to AKI, and opiate overdose requiring narcan  infusion.     ED Course  Upon arrival to the ER CT Head/Cervical Spine negative for acute intracranial abnormality and acute fracture/traumatic malalignment of the cervical spine.  Significant lab results were: K+ 3.3/glucose 118/BUN 49/creatinine 3.49/calcium  8.6/albumin 2.8/total bilirubin 1.6/hgb 9.2/platelet count 52.  CT Abd/Pelvis revealed no acute intra-abdominal or intrapelvic abnormality, but revealed trace simple free fluid ascites likely due to VP shunt.  While in the ER pt received 1L NS bolus and 20 g of lactulose .  Pt initially admitted to the stepdown unit per hospitalist team but remained in the ER pending bed availability.  However, while holding in the ER pt c/o pain and received scheduled 20 mg oxycodone  ir.  He became altered, hypotensive, and bradycardic with pinpoint pupils.  He received narcan , atropine , and briefly started on levophed  gtt.   Patient was admitted to stepdown unit with PCCM consulted.  BP's improved, but later required pressors again and PCCM was re-consulted and treated with Dopamine , stress dose IV steroids.  Cardiology was consulted.  Summary of significant hospital events: 07/18: Pt admitted to the stepdown unit with hypovolemic shock and acute toxic metabolic encephalopathy   07/19: Pt developed recurrent hypotension, sinus bradycardia, and worsening acute toxic metabolic  encephalopathy requiring levophed  gtt.  Pt transferred to ICU and PCCM team consulted  04/18/24- patient off vasopressors and ionotropes today.  Thrombocytopenia possible due to anti epileptic therapy.  Follows commands but is with encephalopathy.  04/19/24: Again requiring Dopamine  to maintain BP, with attempt to wean, again became hypotensive and bradycardic.  Check cortisol, start stress dose steroids. Consult Cardiology.  04/20/24: No significant events noted overnight.  Remains on Dopamine  ~ able to be weaned off Dopamine  later in the morning.   AKI improving, Creatinine down to 2.1 from 2.4, UOP 800 cc last 24 hrs (net + 3 L).   I assumed care from PCCM on 7/23, pt weaned off vasopressors with fairly stable BP's on midodrine  and stress dose steroids.  7/23--24 -- weaning down stress steroids.  BP's stable in 110's systolic.   Assessment and Plan:  Acute toxic metabolic encephalopathy  Hx of Seizure disorder Hx of Chronic pain  Recurrent falls  Hx: Lewy body dementia, meningitis s/p VP shunt, and epilepsy  MRI Brain negative for acute intracranial process UDS and Ethanol is negative --Mgmt of underlying conditions as outlined --Delirium precautions --Avoid sedating medications as much as possible --Continue home Valproate  --PT/OT evaluations -- SNF recommended   Multifactorial Shock: hypovolemia and possible medication induced due to polypharmacy +/- Adrenal Insufficiency vs ? Autonomic Dysfunction (given his Lewy Body Dementia) Sinus bradycardia  Hx: HLD and HTN  Echocardiogram 04/17/24: LVEF 60-65%, grade I DD, RV systolic function is normal, RV size normal Lactic acid has normalized HS Troponin is normal TSH was normal --Telemetry --Maintain MAP >65 --Continue midodrine  --IV stress dose steroids -- reduce from 100 >> 50 mg TID through  today, then stop --Consider Florinef if needed --Cardiology following, appreciate input ~ not a good candidate for PPM   Emphysema/COPD  without exacerbation  -Supplemental O2 as needed to maintain O2 sats 88 to 92% -Follow intermittent Chest X-ray & ABG as needed -Bronchodilators  -Pulmonary toilet as able   Acute kidney injury superimposed on CKD stage IV ~ IMPROVING Hypernatremia ~ RESOLVED -Monitor I&O's / urinary output -Follow BMP -Ensure adequate renal perfusion -Avoid nephrotoxic agents as able -Replace electrolytes as indicated ~ Pharmacy following for assistance with electrolyte replacement   Leukopenia UA without evidence of UTI, Chest X-ray without evidence of pneumonia, abdominal exam is benign, CT abdomen pelvis without acute process --Monitor fever curve, CBC --Follow cultures  --Hold off empiric ABX pending cultures & sensitivities --Monitor clinically for s/sx's of infection   Acute on Chronic Thrombocytopenia Macrocytic Anemia ~ no signs of active bleeding -Monitor for S/Sx of bleeding -Trend CBC -SCD's for VTE Prophylaxis (no chemical ppx given thrombocytopenia) -Transfuse for Hgb <7 -Transfuse platelets for platelet count <10 K , <50 K with active bleeding, or <100 K with Neurosurgical procedures/processes   Type II Diabetes Mellitus -CBG's q4h; Target range of 140 to 180 -Sliding scale Novolog    Minimally displaced Acromial Fracture of Left Shoulder -Orthopedics following, appreciate input ~ no treatment or restrictions at this time, continue with PT/OT   Prognosis -- pt's short-term prognosis remains guarded and high risk for clinical decompensation, cardiac arrest and death.  Given superimposed multiple other co-morbid conditions, long term prognosis overall is poor.  Code status DNR/DNI. --Will consider Palliative Care consultation for GOC discussions pending clinical course      Subjective: Pt seen awake resting in bed.  He denies complaints today, reports feeling better.  Requests I call his family and ask them to come visit him.   Physical Exam: Vitals:   04/22/24 0503  04/22/24 0730 04/22/24 1101 04/22/24 1506  BP:  114/71 117/62 (!) 115/56  Pulse:  (!) 48 (!) 52 (!) 51  Resp:  18 18 16   Temp:  98.8 F (37.1 C) 97.7 F (36.5 C) 98.1 F (36.7 C)  TempSrc:      SpO2:  100% 100% 99%  Weight: 83.4 kg     Height:       General exam: awake, alert, no acute distress, chronically ill appearing HEENT: moist mucus membranes, hearing grossly normal  Respiratory system: CTAB, no wheezes, rales or rhonchi, normal respiratory effort. Cardiovascular system: normal S1/S2, RRR, no pedal edema.   Gastrointestinal system: soft, NT, ND Central nervous system: A&O x self and hospital. no gross focal neurologic deficits, normal speech Extremities: moves all, no edema, normal tone Skin: dry, intact, normal temperature Psychiatry: normal mood, congruent affect, confused   Data Reviewed:  Notable labs --   Cr 2.35 >> 1.83 Glucose 121 BUN 53 Ca 8.7 Albumin 2.4 WBC 2.7 Hbg 8.2 >> 8.9 Platelets 26 >> 26    Family Communication: None present. Will attempt to call this afternoon as time allows.    Disposition: Status is: Inpatient Remains inpatient appropriate because: severity of illness as above, remains on IV therapies and with persistent encephalopathy   Planned Discharge Destination: Skilled nursing facility    Time spent: 38 minutes  Author: Burnard DELENA Cunning, DO 04/22/2024 3:42 PM  For on call review [email or web address].

## 2024-04-23 DIAGNOSIS — R571 Hypovolemic shock: Secondary | ICD-10-CM | POA: Diagnosis not present

## 2024-04-23 LAB — CBC
HCT: 26.9 % — ABNORMAL LOW (ref 39.0–52.0)
Hemoglobin: 8.5 g/dL — ABNORMAL LOW (ref 13.0–17.0)
MCH: 33.9 pg (ref 26.0–34.0)
MCHC: 31.6 g/dL (ref 30.0–36.0)
MCV: 107.2 fL — ABNORMAL HIGH (ref 80.0–100.0)
Platelets: 32 K/uL — ABNORMAL LOW (ref 150–400)
RBC: 2.51 MIL/uL — ABNORMAL LOW (ref 4.22–5.81)
RDW: 15.4 % (ref 11.5–15.5)
WBC: 3.1 K/uL — ABNORMAL LOW (ref 4.0–10.5)
nRBC: 0 % (ref 0.0–0.2)

## 2024-04-23 LAB — RENAL FUNCTION PANEL
Albumin: 2.2 g/dL — ABNORMAL LOW (ref 3.5–5.0)
Anion gap: 10 (ref 5–15)
BUN: 60 mg/dL — ABNORMAL HIGH (ref 8–23)
CO2: 27 mmol/L (ref 22–32)
Calcium: 8.4 mg/dL — ABNORMAL LOW (ref 8.9–10.3)
Chloride: 107 mmol/L (ref 98–111)
Creatinine, Ser: 2.01 mg/dL — ABNORMAL HIGH (ref 0.61–1.24)
GFR, Estimated: 36 mL/min — ABNORMAL LOW (ref >60)
Glucose, Bld: 132 mg/dL — ABNORMAL HIGH (ref 70–99)
Phosphorus: 2.4 mg/dL — ABNORMAL LOW (ref 2.5–4.6)
Potassium: 3.7 mmol/L (ref 3.5–5.1)
Sodium: 144 mmol/L (ref 135–145)

## 2024-04-23 LAB — GLUCOSE, CAPILLARY
Glucose-Capillary: 121 mg/dL — ABNORMAL HIGH (ref 70–99)
Glucose-Capillary: 87 mg/dL (ref 70–99)
Glucose-Capillary: 136 mg/dL — ABNORMAL HIGH (ref 70–99)
Glucose-Capillary: 209 mg/dL — ABNORMAL HIGH (ref 70–99)

## 2024-04-23 MED ORDER — PANTOPRAZOLE SODIUM 40 MG PO TBEC
40.0000 mg | DELAYED_RELEASE_TABLET | Freq: Every day | ORAL | Status: DC
  Administered 2024-04-24 – 2024-04-28 (×5): 40 mg via ORAL
  Filled 2024-04-23 (×5): qty 1

## 2024-04-23 NOTE — Plan of Care (Signed)

## 2024-04-23 NOTE — Progress Notes (Signed)
 Patient anxious and agitated continues to try and climb out  of bed. Insists that his son is coming to pick him up and they are going to the races. Son Elspeth called no answer, son brandon called and who talked to the patient briefly. Then patient insisted that he was waiting on his food that he ordered which was a cheeseburger and french fries. Explained to patient that kitchen is currently closed. Patient is on a dysphagia 3 diet. Offered pudding or applesauce, patient declined. PRN haldol  given as ordered. Personal items within reach. Tele sitter remains in place for patient safety.

## 2024-04-23 NOTE — Progress Notes (Signed)
 Physical Therapy Treatment Patient Details Name: Albert Figueroa MRN: [id number] DOB: 03/11/1956 Today's Date: 04/23/2024   History of Present Illness Pt is a 68 y.o. male who presented to the ER with acute onset of generalized weakness and concern for dehydration with recent recurrent falls. Admitted for management of  Hypovolemic shock, acute metabolic encephalopathy, and sinus bradycardia. 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 Pt with improved mobility today, able to perform bed mobility minA-CGA, sit <> stand twice with handheld assist minAx2. Able to take a few steps forwards/backwards/laterally with minAx2. Pt fatigued and reported dizziness with activity. Returned to supine with needs in reach. The patient would benefit from further skilled PT intervention to continue to progress towards goals.    If plan is discharge home, recommend the following: A lot of help with walking and/or transfers;A lot of help with bathing/dressing/bathroom   Can travel by private vehicle     No  Equipment Recommendations  None recommended by PT    Recommendations for Other Services       Precautions / Restrictions Precautions Precautions: Fall Recall of Precautions/Restrictions: Impaired Precaution/Restrictions Comments: dizzy, hypotensive/orthostatic Restrictions Other Position/Activity Restrictions: no restrictions from previous L acromial fx per ortho     Mobility  Bed Mobility Overal bed mobility: Needs Assistance Bed Mobility: Supine to Sit     Supine to sit: Min assist Sit to supine: Contact guard assist        Transfers Overall transfer level: Needs assistance Equipment used: 2 person hand held  assist Transfers: Sit to/from Stand Sit to Stand: Min assist           General transfer comment: steadying assist, pt did not need stedy    Ambulation/Gait Ambulation/Gait assistance: Min assist, +2 safety/equipment Gait Distance (Feet): 3 Feet Assistive device: 2 person hand held assist         General Gait Details: able to step forwards/backwards/laterally   Stairs             Wheelchair Mobility     Tilt Bed    Modified Rankin (Stroke Patients Only)       Balance Overall balance assessment: Needs assistance Sitting-balance support: Feet supported, Single extremity supported Sitting balance-Leahy Scale: Good Sitting balance - Comments: sitting EOB   Standing balance support: Bilateral upper extremity supported Standing balance-Leahy Scale: Poor                              Communication Communication Communication: Impaired  Cognition Arousal: Alert Behavior During Therapy: Impulsive                           PT - Cognition Comments: pt oriented to self, location Following commands: Impaired Following commands impaired: Follows one step commands with increased time    Cueing Cueing Techniques: Verbal cues, Tactile cues, Visual cues  Exercises      General Comments        Pertinent Vitals/Pain Pain Assessment Pain Assessment: Faces Faces Pain Scale: Hurts a little bit Pain Location: back and LUE Pain Descriptors / Indicators: Grimacing, Aching, Guarding, Sore Pain Intervention(s): Limited activity within patient's tolerance, Monitored during session, Repositioned    Home  Living                          Prior Function            PT Goals (current goals can now be found in the care plan section) Progress towards PT goals: Progressing toward goals    Frequency    Min 2X/week      PT Plan      Co-evaluation              AM-PAC PT 6 Clicks Mobility   Outcome Measure  Help needed  turning from your back to your side while in a flat bed without using bedrails?: A Little Help needed moving from lying on your back to sitting on the side of a flat bed without using bedrails?: A Little Help needed moving to and from a bed to a chair (including a wheelchair)?: A Little Help needed standing up from a chair using your arms (e.g., wheelchair or bedside chair)?: A Little Help needed to walk in hospital room?: A Lot Help needed climbing 3-5 steps with a railing? : Total 6 Click Score: 15    End of Session   Activity Tolerance: Patient tolerated treatment well Patient left: in bed;with call bell/phone within reach;with bed alarm set Nurse Communication: Mobility status PT Visit Diagnosis: Difficulty in walking, not elsewhere classified (R26.2);Muscle weakness (generalized) (M62.81)     Time: 9056-9042 PT Time Calculation (min) (ACUTE ONLY): 14 min  Charges:    $Therapeutic Activity: 8-22 mins PT General Charges $$ ACUTE PT VISIT: 1 Visit                     Doyal Shams PT, DPT 1:04 PM,04/23/24

## 2024-04-23 NOTE — NC FL2 (Signed)
 [facility]  MEDICAID FL2 LEVEL OF CARE FORM     IDENTIFICATION  Patient Name: Albert Figueroa Birthdate: 07-28-1956 Sex: male Admission Date (Current Location): 04/16/2024  [facility] and [place] Number:  Chiropodist and Address:  [facility], [address], [place], [place] [address]      Provider Number: [id number]  Attending Physician Name and Address:  Fausto Burnard LABOR, DO  Relative Name and Phone Number:  Macario Prom (Daughter)  [phone] (Mobile    Current Level of Care: Hospital Recommended Level of Care: Skilled Nursing Facility Prior Approval Number:    Date Approved/Denied:   PASRR Number: [id number]  Discharge Plan: SNF    Current Diagnoses: Patient Active Problem List   Diagnosis Date Noted   Confusion 04/21/2024   Cardiogenic shock (HCC) 04/20/2024   Hypovolemic shock (HCC) 04/17/2024   Seizure disorder (HCC) 04/17/2024   Lewy body dementia (HCC) 04/17/2024   Right leg weakness 04/17/2024   Orthostatic hypotension 03/07/2024   Nondisplaced fracture of left acromial process 03/06/2024   History of opiate overdose treated with Narcan  infusion 12/2023 03/06/2024   History of seizure 03/06/2024   Sinus bradycardia 03/06/2024   Polypharmacy 01/15/2024   Opioid overdose (HCC) 01/15/2024   Diarrhea 12/30/2023   Hyperammonemia (HCC) 12/30/2023   Weakness 12/30/2023   Acute kidney injury superimposed on CKD (HCC) 12/10/2023   Hypernatremia 12/10/2023   Type 2 diabetes mellitus with chronic kidney disease, without long-term current use of insulin  (HCC) 12/10/2023   Dementia with behavioral disturbance (HCC) 12/10/2023   Moderate Lewy body dementia without behavioral disturbance, psychotic disturbance, mood disturbance, or anxiety (HCC) 05/13/2023   Dehydration 03/05/2023   Acute renal failure superimposed on chronic kidney disease (HCC) 03/04/2023   Seizure (HCC) 03/04/2023   Pancytopenia (HCC) 09/06/2022   Hypoglycemia  09/03/2022   Acute metabolic encephalopathy 09/02/2022   Hypotension 09/02/2022   Blepharitis of right lower eyelid 09/02/2022   Dyslipidemia 09/02/2022   Pulmonary hypertension (HCC) 09/02/2022   Depression with anxiety 09/02/2022   GERD without esophagitis 09/02/2022   Lactic acidosis 09/02/2022   Acute kidney injury superimposed on stage IIIb chronic kidney disease (HCC) 09/01/2022   Chronic pain 07/05/2022   Type 2 diabetes mellitus with renal complication (HCC) 07/05/2022   Over weight 07/05/2022   Thrombocytopenia (HCC) 07/05/2022   Fall at home, initial encounter 07/05/2022   Cellulitis of right hand 07/05/2022   COPD (chronic obstructive pulmonary disease) (HCC) 07/05/2022   Iron deficiency anemia 07/05/2022   Toxic encephalopathy 07/05/2022   Neuropathy 07/05/2022   Right wrist pain    Chronic pain syndrome 01/01/2021   Pharmacologic therapy 01/01/2021   Disorder of skeletal system 01/01/2021   Problems influencing health status 01/01/2021   Aortic atherosclerosis (HCC) 02/24/2020   Other emphysema (HCC) 02/24/2020   Medicare annual wellness visit, initial 02/03/2020   Neck pain 09/15/2018   Sacroiliac joint pain 09/08/2018   Lumbar spondylosis 06/11/2018   Left hip pain 06/11/2018   Chronic pain of both knees 06/11/2018   CKD (chronic kidney disease) stage 4, GFR 15-29 ml/min (HCC) 10/29/2017   Obesity (BMI 30-39.9) 11/27/2015   Tobacco use 06/13/2015   Essential hypertension 06/13/2015   Hyperlipidemia, unspecified 06/13/2015   Osteoarthritis 06/13/2015   Biceps tendinitis 04/06/2015   Epilepsy without status epilepticus, not intractable (HCC) 12/10/2013    Orientation RESPIRATION BLADDER Height & Weight     Self, Time, Situation  Normal External catheter, Incontinent Weight: 84 kg Height:  5' 11 (180.3 cm)  BEHAVIORAL SYMPTOMS/MOOD NEUROLOGICAL BOWEL NUTRITION STATUS      Incontinent Diet (DYS 3)  AMBULATORY STATUS COMMUNICATION OF NEEDS Skin   Limited  Assist Verbally Bruising (scattered eechymosis)                       Personal Care Assistance Level of Assistance  Bathing, Feeding, Dressing Bathing Assistance: Limited assistance Feeding assistance: Limited assistance Dressing Assistance: Limited assistance     Functional Limitations Info  Sight, Hearing Sight Info: Adequate Hearing Info: Adequate      SPECIAL CARE FACTORS FREQUENCY  PT (By licensed PT), OT (By licensed OT)     PT Frequency: Min 2x weekly OT Frequency: Min 2x weekly            Contractures Contractures Info: Not present    Additional Factors Info  Code Status, Allergies Code Status Info: DNR Allergies Info: Aricept  (Donepezil ), Gabapentin , Amlodipine, Ozempic (0.25 Or 0.5 Mg-dose) (Semaglutide(0.25 Or 0.5mg -dos))           Current Medications (04/23/2024):  This is the current hospital active medication list Current Facility-Administered Medications  Medication Dose Route Frequency Provider Last Rate Last Admin   acetaminophen  (TYLENOL ) tablet 650 mg  650 mg Oral Q6H PRN Mansy, Jan A, MD   650 mg at 04/20/24 2353   Or   acetaminophen  (TYLENOL ) suppository 650 mg  650 mg Rectal Q6H PRN Mansy, Jan A, MD       albuterol  (PROVENTIL ) (2.5 MG/3ML) 0.083% nebulizer solution 2.5 mg  2.5 mg Inhalation Q6H PRN Mansy, Jan A, MD       budesonide -glycopyrrolate -formoterol  (BREZTRI ) 160-9-4.8 MCG/ACT inhaler 2 puff  2 puff Inhalation BID Mansy, Jan A, MD   2 puff at 04/21/24 9049   Chlorhexidine  Gluconate Cloth 2 % PADS 6 each  6 each Topical Daily Josette Ade, MD   6 each at 04/21/24 1635   divalproex  (DEPAKOTE  ER) 24 hr tablet 1,000 mg  1,000 mg Oral BID Fausto Sor A, DO   1,000 mg at 04/22/24 0818   feeding supplement (ENSURE PLUS HIGH PROTEIN) liquid 237 mL  237 mL Oral TID BM Kasa, Kurian, MD   237 mL at 04/22/24 2041   haloperidol  lactate (HALDOL ) injection 5 mg  5 mg Intravenous Q6H PRN Fausto Sor A, DO   5 mg at 04/21/24 1256    hydrOXYzine  (ATARAX ) tablet 10 mg  10 mg Oral TID PRN Mansy, Jan A, MD   10 mg at 04/20/24 1838   magnesium  hydroxide (MILK OF MAGNESIA) suspension 30 mL  30 mL Oral Daily PRN Mansy, Jan A, MD       midodrine  (PROAMATINE ) tablet 10 mg  10 mg Oral TID WC Mansy, Jan A, MD   10 mg at 04/22/24 1640   multivitamin with minerals tablet 1 tablet  1 tablet Oral Daily Kasa, Kurian, MD   1 tablet at 04/22/24 0818   ondansetron  (ZOFRAN ) tablet 4 mg  4 mg Oral Q6H PRN Mansy, Jan A, MD       Or   ondansetron  (ZOFRAN ) injection 4 mg  4 mg Intravenous Q6H PRN Mansy, Jan A, MD   4 mg at 04/17/24 9472   oxyCODONE  (Oxy IR/ROXICODONE ) immediate release tablet 5 mg  5 mg Oral Q4H PRN Josette Ade, MD   5 mg at 04/21/24 0237   pantoprazole  (PROTONIX ) injection 40 mg  40 mg Intravenous Q24H Chappell, Alex B, RPH   40 mg at 04/22/24 9176   roflumilast  (DALIRESP )  tablet 250 mcg  250 mcg Oral Daily Mansy, Jan A, MD   250 mcg at 04/22/24 0820   traZODone  (DESYREL ) tablet 25 mg  25 mg Oral QHS PRN Mansy, Jan A, MD   25 mg at 04/22/24 2041     Discharge Medications: Please see discharge summary for a list of discharge medications.  Relevant Imaging Results:  Relevant Lab Results:   Additional Information SS #: [id number]  Tomasa JAYSON Childes, RN

## 2024-04-23 NOTE — Progress Notes (Addendum)
 Progress Note   Patient: Albert Figueroa FMW:[id number] DOB: December 15, 1955 DOA: 04/16/2024     6 DOS: the patient was seen and examined on 04/23/2024   Brief hospital course:  Albert Figueroa is a 67 y.o. 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  Pt previously hospitalized 04/17 to 04/21 with hypotension/sinus bradycardia attributed to AKI, and opiate overdose requiring narcan  infusion.     ED Course  Upon arrival to the ER CT Head/Cervical Spine negative for acute intracranial abnormality and acute fracture/traumatic malalignment of the cervical spine.  Significant lab results were: K+ 3.3/glucose 118/BUN 49/creatinine 3.49/calcium  8.6/albumin 2.8/total bilirubin 1.6/hgb 9.2/platelet count 52.  CT Abd/Pelvis revealed no acute intra-abdominal or intrapelvic abnormality, but revealed trace simple free fluid ascites likely due to VP shunt.  While in the ER pt received 1L NS bolus and 20 g of lactulose .  Pt initially admitted to the stepdown unit per hospitalist team but remained in the ER pending bed availability.  However, while holding in the ER pt c/o pain and received scheduled 20 mg oxycodone  ir.  He became altered, hypotensive, and bradycardic with pinpoint pupils.  He received narcan , atropine , and briefly started on levophed  gtt.   Patient was admitted to stepdown unit with PCCM consulted.  BP's improved, but later required pressors again and PCCM was re-consulted and treated with Dopamine , stress dose IV steroids.  Cardiology was consulted.  Summary of significant hospital events: 07/18: Pt admitted to the stepdown unit with hypovolemic shock and acute toxic metabolic encephalopathy   07/19: Pt developed recurrent hypotension, sinus bradycardia, and worsening acute toxic metabolic  encephalopathy requiring levophed  gtt.  Pt transferred to ICU and PCCM team consulted  04/18/24- patient off vasopressors and ionotropes today.  Thrombocytopenia possible due to anti epileptic therapy.  Follows commands but is with encephalopathy.  04/19/24: Again requiring Dopamine  to maintain BP, with attempt to wean, again became hypotensive and bradycardic.  Check cortisol, start stress dose steroids. Consult Cardiology.  04/20/24: No significant events noted overnight.  Remains on Dopamine  ~ able to be weaned off Dopamine  later in the morning.   AKI improving, Creatinine down to 2.1 from 2.4, UOP 800 cc last 24 hrs (net + 3 L).   I assumed care from PCCM on 7/23, pt weaned off vasopressors with fairly stable BP's on midodrine  and stress dose steroids.  7/23--24 -- weaning down stress steroids.  BP's stable in 110's systolic. 7/25 -- weaned off steroids, only on midodrine , BP's stable   Assessment and Plan:  Acute toxic metabolic encephalopathy  Hx of Seizure disorder Hx of Chronic pain  Recurrent falls  Hx: Lewy body dementia, meningitis s/p VP shunt, and epilepsy  MRI Brain negative for acute intracranial process UDS and Ethanol is negative --Mgmt of underlying conditions as outlined --Delirium precautions --Avoid sedating medications as much as possible --Continue home Valproate  --PT/OT evaluations -- SNF recommended   Multifactorial Shock: hypovolemia and possible medication induced due to polypharmacy +/- Adrenal Insufficiency vs ? Autonomic Dysfunction (given his Lewy Body Dementia) Sinus bradycardia  Hx: HLD and HTN  Echocardiogram 04/17/24: LVEF 60-65%, grade I DD, RV systolic function is normal, RV size normal Lactic acid has normalized HS Troponin is normal TSH was normal --Telemetry --Maintain MAP >65 --Continue midodrine  --Weaned off IV  stress dose steroids -- as of evening 7/24 -- Bp's stable --Cardiology following, appreciate input ~ not a good candidate for  PPM   Emphysema/COPD without exacerbation  -Supplemental O2 as needed to maintain O2 sats 88 to 92% -Follow intermittent Chest X-ray & ABG as needed -Bronchodilators  -Pulmonary toilet as able   Acute kidney injury superimposed on CKD stage IV ~ IMPROVING Hypernatremia ~ RESOLVED -Monitor I&O's / urinary output -Follow BMP -Ensure adequate renal perfusion -Avoid nephrotoxic agents as able -Replace electrolytes as indicated ~ Pharmacy following for assistance with electrolyte replacement   Leukopenia UA without evidence of UTI, Chest X-ray without evidence of pneumonia, abdominal exam is benign, CT abdomen pelvis without acute process --Monitor fever curve, CBC --Follow cultures  --Hold off empiric ABX pending cultures & sensitivities --Monitor clinically for s/sx's of infection   Acute on Chronic Thrombocytopenia Macrocytic Anemia ~ no signs of active bleeding -Monitor for S/Sx of bleeding -Trend CBC -SCD's for VTE Prophylaxis (no chemical ppx given thrombocytopenia) -Transfuse for Hgb <7 -Transfuse platelets for platelet count <10 K , <50 K with active bleeding, or <100 K with Neurosurgical procedures/processes   Type II Diabetes Mellitus -CBG's change to AC/HS -Target range of 140 to 180 -Sliding scale Novolog  was dc'd    Minimally displaced Acromial Fracture of Left Shoulder -Orthopedics following, appreciate input ~ no treatment or restrictions at this time, continue with PT/OT   Prognosis -- pt's short-term prognosis remains guarded and high risk for clinical decompensation, cardiac arrest and death.  Given superimposed multiple other co-morbid conditions, long term prognosis overall is poor.  Code status DNR/DNI. --Consider Palliative Care consultation for GOC discussions pending clinical course      Subjective: Pt sleeping comfortably this AM on rounds, wakes to voice briefly.  He denies acute complaints.  Still seems confused, not sure why he is still in  hospital. No acute events reported overnight.   Physical Exam: Vitals:   04/23/24 0500 04/23/24 0530 04/23/24 0817 04/23/24 1134  BP:  115/63 98/82 115/70  Pulse:  (!) 51 (!) 50 (!) 59  Resp:  18    Temp:  97.9 F (36.6 C) 97.8 F (36.6 C) 97.7 F (36.5 C)  TempSrc:  Oral    SpO2:  100% 98% 97%  Weight: 84 kg     Height:       General exam:sleeping comfortably, wakes to voice, no acute distress, chronically ill appearing HEENT: moist mucus membranes, hearing grossly normal  Respiratory system: on room air, normal respiratory effort. Cardiovascular system: RRR, no pedal edema.   Gastrointestinal system: soft, NT, ND Central nervous system: A&O x self and hospital. no gross focal neurologic deficits, normal speech Skin: dry, intact, normal temperature Psychiatry: normal mood, congruent affect, confused   Data Reviewed:  Notable labs --   Cr 2.35 >> 1.83 >> 2.01 Glucose 132 BUN 60 Ca 8.4 Albumin 2.2  WBC 3.1 Hbg 8.2 >> 8.9 >> 8.5 Platelets 26 >> 26 >> 32    Family Communication: updated daughter by phone this afternoon 7/25    Disposition: Status is: Inpatient Remains inpatient appropriate because: severity of illness as above, remains on IV therapies and with persistent encephalopathy   Planned Discharge Destination: Skilled nursing facility    Time spent: 36 minutes  Author: Burnard DELENA Cunning, DO 04/23/2024 1:28 PM  For on call review [email or web address].

## 2024-04-23 NOTE — Progress Notes (Signed)
 Patient sleeping resting comfortable. No S/S of distress. Personal items and call light within reach.

## 2024-04-24 DIAGNOSIS — R571 Hypovolemic shock: Secondary | ICD-10-CM | POA: Diagnosis not present

## 2024-04-24 LAB — GLUCOSE, CAPILLARY
Glucose-Capillary: 96 mg/dL (ref 70–99)
Glucose-Capillary: 142 mg/dL — ABNORMAL HIGH (ref 70–99)
Glucose-Capillary: 115 mg/dL — ABNORMAL HIGH (ref 70–99)
Glucose-Capillary: 150 mg/dL — ABNORMAL HIGH (ref 70–99)

## 2024-04-24 LAB — CBC
HCT: 24.6 % — ABNORMAL LOW (ref 39.0–52.0)
Hemoglobin: 7.9 g/dL — ABNORMAL LOW (ref 13.0–17.0)
MCH: 33.6 pg (ref 26.0–34.0)
MCHC: 32.1 g/dL (ref 30.0–36.0)
MCV: 104.7 fL — ABNORMAL HIGH (ref 80.0–100.0)
Platelets: 46 K/uL — ABNORMAL LOW (ref 150–400)
RBC: 2.35 MIL/uL — ABNORMAL LOW (ref 4.22–5.81)
RDW: 15.7 % — ABNORMAL HIGH (ref 11.5–15.5)
WBC: 3.7 K/uL — ABNORMAL LOW (ref 4.0–10.5)
nRBC: 0 % (ref 0.0–0.2)

## 2024-04-24 LAB — RENAL FUNCTION PANEL
Albumin: 2.3 g/dL — ABNORMAL LOW (ref 3.5–5.0)
Anion gap: 3 — ABNORMAL LOW (ref 5–15)
BUN: 62 mg/dL — ABNORMAL HIGH (ref 8–23)
CO2: 28 mmol/L (ref 22–32)
Calcium: 8.6 mg/dL — ABNORMAL LOW (ref 8.9–10.3)
Chloride: 108 mmol/L (ref 98–111)
Creatinine, Ser: 2.15 mg/dL — ABNORMAL HIGH (ref 0.61–1.24)
GFR, Estimated: 33 mL/min — ABNORMAL LOW (ref >60)
Glucose, Bld: 111 mg/dL — ABNORMAL HIGH (ref 70–99)
Phosphorus: 2.1 mg/dL — ABNORMAL LOW (ref 2.5–4.6)
Potassium: 4.4 mmol/L (ref 3.5–5.1)
Sodium: 139 mmol/L (ref 135–145)

## 2024-04-24 MED ORDER — POTASSIUM & SODIUM PHOSPHATES 280-160-250 MG PO PACK
1.0000 | PACK | Freq: Three times a day (TID) | ORAL | Status: AC
  Administered 2024-04-24 – 2024-04-25 (×3): 1 via ORAL
  Filled 2024-04-24 (×4): qty 1

## 2024-04-24 MED ORDER — MIDODRINE HCL 5 MG PO TABS
5.0000 mg | ORAL_TABLET | Freq: Three times a day (TID) | ORAL | Status: DC
  Administered 2024-04-24 – 2024-04-25 (×3): 5 mg via ORAL
  Filled 2024-04-24 (×3): qty 1

## 2024-04-24 NOTE — Progress Notes (Signed)
 SLP Cancellation Note  Patient Details Name: Albert Figueroa MRN: [id number] DOB: [birth year]   Cancelled treatment:       Reason Eval/Treat Not Completed: SLP screened, no needs identified, will sign off  Bedside swallow evaluation completed on 7/20, with identification of mild aspiration risk and recommendation for Dys 3 secondary to cognition and dentition. Re-eval orders received. Per nursing/physician no overt change in swallow function noted. Recommendations remain appropriate from evaluation on 7/20. RN reporting pt requesting regular solids, with medical team able to advance diet. However, that is not SLP recommendations. RN/physician aware. No further acute SLP services indicated at this time.    [place] Travis Purk Clapp, MS, CCC-SLP Speech Language Pathologist Rehab Services; [facility] [phone] (ascom)   [place] J Clapp 04/24/2024, 9:12 AM

## 2024-04-24 NOTE — Plan of Care (Signed)

## 2024-04-24 NOTE — Progress Notes (Addendum)
 Progress Note   Patient: Albert Figueroa FMW:[id number] DOB: 02-20-1956 DOA: 04/16/2024     7 DOS: the patient was seen and examined on 04/24/2024   Brief hospital course:  DALTYN DEGROAT is a 68 y.o. 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  Pt previously hospitalized 04/17 to 04/21 with hypotension/sinus bradycardia attributed to AKI, and opiate overdose requiring narcan  infusion.     ED Course  Upon arrival to the ER CT Head/Cervical Spine negative for acute intracranial abnormality and acute fracture/traumatic malalignment of the cervical spine.  Significant lab results were: K+ 3.3/glucose 118/BUN 49/creatinine 3.49/calcium  8.6/albumin 2.8/total bilirubin 1.6/hgb 9.2/platelet count 52.  CT Abd/Pelvis revealed no acute intra-abdominal or intrapelvic abnormality, but revealed trace simple free fluid ascites likely due to VP shunt.  While in the ER pt received 1L NS bolus and 20 g of lactulose .  Pt initially admitted to the stepdown unit per hospitalist team but remained in the ER pending bed availability.  However, while holding in the ER pt c/o pain and received scheduled 20 mg oxycodone  ir.  He became altered, hypotensive, and bradycardic with pinpoint pupils.  He received narcan , atropine , and briefly started on levophed  gtt.   Patient was admitted to stepdown unit with PCCM consulted.  BP's improved, but later required pressors again and PCCM was re-consulted and treated with Dopamine , stress dose IV steroids.  Cardiology was consulted.  Summary of significant hospital events: 07/18: Pt admitted to the stepdown unit with hypovolemic shock and acute toxic metabolic encephalopathy   07/19: Pt developed recurrent hypotension, sinus bradycardia, and worsening acute toxic metabolic  encephalopathy requiring levophed  gtt.  Pt transferred to ICU and PCCM team consulted  04/18/24- patient off vasopressors and ionotropes today.  Thrombocytopenia possible due to anti epileptic therapy.  Follows commands but is with encephalopathy.  04/19/24: Again requiring Dopamine  to maintain BP, with attempt to wean, again became hypotensive and bradycardic.  Check cortisol, start stress dose steroids. Consult Cardiology.  04/20/24: No significant events noted overnight.  Remains on Dopamine  ~ able to be weaned off Dopamine  later in the morning.   AKI improving, Creatinine down to 2.1 from 2.4, UOP 800 cc last 24 hrs (net + 3 L).   I assumed care from PCCM on 7/23, pt weaned off vasopressors with fairly stable BP's on midodrine  and stress dose steroids.  7/23--24 -- weaning down stress steroids.  BP's stable in 110's systolic. 7/25 -- weaned off steroids, only on midodrine , BP's stable 7/26 -- BP's stable, decreased midodrine    Assessment and Plan:  Acute toxic metabolic encephalopathy  Hx of Seizure disorder Hx of Chronic pain  Recurrent falls  Hx: Lewy body dementia, meningitis s/p VP shunt, and epilepsy  MRI Brain negative for acute intracranial process UDS and Ethanol is negative --Mgmt of underlying conditions as outlined --Delirium precautions --Avoid sedating medications as much as possible --Continue home Valproate  --PT/OT evaluations -- SNF recommended   Multifactorial Shock: hypovolemia and possible medication induced due to polypharmacy +/- Adrenal Insufficiency vs ? Autonomic Dysfunction (given his Lewy Body Dementia) Sinus bradycardia  Hx: HLD and HTN  Echocardiogram 04/17/24: LVEF 60-65%, grade I DD, RV systolic function is normal, RV size normal Lactic acid has normalized HS Troponin is normal TSH was normal --Telemetry --Maintain MAP >  65 --Reduce midodrine  from 10 >> 5 mg TID --Weaned off IV stress dose steroids -- as of evening 7/24 -- Bp's stable --Cardiology  following, appreciate input ~ not a good candidate for PPM  Hypophosphatemia -- phos 2.1 --oral phos-NaK packets ordered to replace --Monitor & replace phos PRN   Emphysema/COPD without exacerbation  -Supplemental O2 as needed to maintain O2 sats 88 to 92% -Follow intermittent Chest X-ray & ABG as needed -Bronchodilators  -Pulmonary toilet as able   Acute kidney injury superimposed on CKD stage IV ~ IMPROVING Hypernatremia ~ RESOLVED -Monitor I&O's / urinary output -Follow BMP -Ensure adequate renal perfusion -Avoid nephrotoxic agents as able -Replace electrolytes as indicated ~ Pharmacy following for assistance with electrolyte replacement   Leukopenia UA without evidence of UTI, Chest X-ray without evidence of pneumonia, abdominal exam is benign, CT abdomen pelvis without acute process --Monitor fever curve, CBC --Follow cultures  --Hold off empiric ABX pending cultures & sensitivities --Monitor clinically for s/sx's of infection   Acute on Chronic Thrombocytopenia Macrocytic Anemia ~ no signs of active bleeding -Monitor for S/Sx of bleeding -Trend CBC -SCD's for VTE Prophylaxis (no chemical ppx given thrombocytopenia) -Transfuse for Hgb <7 -Transfuse platelets for platelet count <10 K , <50 K with active bleeding, or <100 K with Neurosurgical procedures/processes   Type II Diabetes Mellitus -CBG's change to AC/HS -Target range of 140 to 180 -Sliding scale Novolog  was dc'd    Minimally displaced Acromial Fracture of Left Shoulder -Orthopedics following, appreciate input ~ no treatment or restrictions at this time, continue with PT/OT   Prognosis -- pt's short-term prognosis remains guarded and high risk for clinical decompensation, cardiac arrest and death.  Given superimposed multiple other co-morbid conditions, long term prognosis overall is poor.  Code status DNR/DNI. --Consider Palliative Care consultation for GOC discussions pending clinical course       Subjective: Pt reportedly agitated, restless and very confused overnight, required haldol  for safety.  This AM, pt awake, looks drowsy, but conversational.  He asks about going home.  Denies any complaints.   Physical Exam: Vitals:   04/23/24 2332 04/24/24 0355 04/24/24 0500 04/24/24 0810  BP: (!) 108/52 110/66  (!) 106/58  Pulse: 69 60  (!) 55  Resp: 18 18  16   Temp: 98.4 F (36.9 C) (!) 97.1 F (36.2 C)  98.1 F (36.7 C)  TempSrc:    Oral  SpO2: 96% 100%  100%  Weight:   86.2 kg   Height:       General exam:awake but appears drowsy, no acute distress, chronically ill appearing HEENT: moist mucus membranes, hearing grossly normal  Respiratory system: on room air, normal respiratory effort. Cardiovascular system: RRR, no pedal edema.   Gastrointestinal system: soft, NT, ND Central nervous system: A&O x self and hospital. no gross focal neurologic deficits, normal speech Skin: dry, intact, normal temperature Psychiatry: normal mood, congruent affect, confused   Data Reviewed:  Notable labs --   Cr 2.35 >> 1.83 >> 2.01 >> 2.15 stable Glucose 111 BUN 62 Ca 8.6 Phos 2.1 Albumin 2.3  WBC 3.7 Hbg 8.2 >> 8.9 >> 8.5 >> 7.9  Platelets 26 >> 26 >> 32 >> 46    Family Communication: updated daughter by phone afternoon 7/25    Disposition: Status is: Inpatient Remains inpatient appropriate because: severity of illness as above, remains on IV therapies and with persistent encephalopathy   Planned Discharge Destination: Skilled nursing facility    Time spent: 35 minutes  Author: Burnard  DELENA Cunning, DO 04/24/2024 12:01 PM  For on call review [email or web address].

## 2024-04-24 NOTE — Plan of Care (Signed)
  Problem: Clinical Measurements: Goal: Ability to maintain clinical measurements within normal limits will improve Outcome: Progressing   Problem: Activity: Goal: Risk for activity intolerance will decrease Outcome: Progressing   Problem: Safety: Goal: Ability to remain free from injury will improve Outcome: Progressing   Problem: Skin Integrity: Goal: Risk for impaired skin integrity will decrease Outcome: Progressing

## 2024-04-25 DIAGNOSIS — R571 Hypovolemic shock: Secondary | ICD-10-CM | POA: Diagnosis not present

## 2024-04-25 LAB — BASIC METABOLIC PANEL WITH GFR
Anion gap: 6 (ref 5–15)
BUN: 55 mg/dL — ABNORMAL HIGH (ref 8–23)
CO2: 27 mmol/L (ref 22–32)
Calcium: 8.4 mg/dL — ABNORMAL LOW (ref 8.9–10.3)
Chloride: 106 mmol/L (ref 98–111)
Creatinine, Ser: 2.1 mg/dL — ABNORMAL HIGH (ref 0.61–1.24)
GFR, Estimated: 34 mL/min — ABNORMAL LOW (ref >60)
Glucose, Bld: 120 mg/dL — ABNORMAL HIGH (ref 70–99)
Potassium: 4.1 mmol/L (ref 3.5–5.1)
Sodium: 139 mmol/L (ref 135–145)

## 2024-04-25 LAB — GLUCOSE, CAPILLARY
Glucose-Capillary: 91 mg/dL (ref 70–99)
Glucose-Capillary: 127 mg/dL — ABNORMAL HIGH (ref 70–99)
Glucose-Capillary: 109 mg/dL — ABNORMAL HIGH (ref 70–99)

## 2024-04-25 LAB — PHOSPHORUS: Phosphorus: 2.2 mg/dL — ABNORMAL LOW (ref 2.5–4.6)

## 2024-04-25 MED ORDER — OXYCODONE HCL 5 MG PO TABS
10.0000 mg | ORAL_TABLET | ORAL | Status: DC | PRN
  Administered 2024-04-25 – 2024-04-28 (×10): 10 mg via ORAL
  Filled 2024-04-25 (×10): qty 2

## 2024-04-25 MED ORDER — MIDODRINE HCL 5 MG PO TABS
2.5000 mg | ORAL_TABLET | Freq: Three times a day (TID) | ORAL | Status: DC
  Administered 2024-04-25 – 2024-04-28 (×10): 2.5 mg via ORAL
  Filled 2024-04-25 (×9): qty 1

## 2024-04-25 MED ORDER — POTASSIUM & SODIUM PHOSPHATES 280-160-250 MG PO PACK
1.0000 | PACK | Freq: Three times a day (TID) | ORAL | Status: AC
  Administered 2024-04-25 – 2024-04-28 (×5): 1 via ORAL
  Filled 2024-04-25 (×9): qty 1

## 2024-04-25 NOTE — Progress Notes (Signed)
 Progress Note   Patient: Albert Figueroa FMW:[id number] DOB: 1955/11/26 DOA: 04/16/2024     8 DOS: the patient was seen and examined on 04/25/2024   Brief hospital course:  Albert Figueroa is a 68 y.o. 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  Pt previously hospitalized 04/17 to 04/21 with hypotension/sinus bradycardia attributed to AKI, and opiate overdose requiring narcan  infusion.     ED Course  Upon arrival to the ER CT Head/Cervical Spine negative for acute intracranial abnormality and acute fracture/traumatic malalignment of the cervical spine.  Significant lab results were: K+ 3.3/glucose 118/BUN 49/creatinine 3.49/calcium  8.6/albumin 2.8/total bilirubin 1.6/hgb 9.2/platelet count 52.  CT Abd/Pelvis revealed no acute intra-abdominal or intrapelvic abnormality, but revealed trace simple free fluid ascites likely due to VP shunt.  While in the ER pt received 1L NS bolus and 20 g of lactulose .  Pt initially admitted to the stepdown unit per hospitalist team but remained in the ER pending bed availability.  However, while holding in the ER pt c/o pain and received scheduled 20 mg oxycodone  ir.  He became altered, hypotensive, and bradycardic with pinpoint pupils.  He received narcan , atropine , and briefly started on levophed  gtt.   Patient was admitted to stepdown unit with PCCM consulted.  BP's improved, but later required pressors again and PCCM was re-consulted and treated with Dopamine , stress dose IV steroids.  Cardiology was consulted.  Summary of significant hospital events: 07/18: Pt admitted to the stepdown unit with hypovolemic shock and acute toxic metabolic encephalopathy   07/19: Pt developed recurrent hypotension, sinus bradycardia, and worsening acute toxic metabolic  encephalopathy requiring levophed  gtt.  Pt transferred to ICU and PCCM team consulted  04/18/24- patient off vasopressors and ionotropes today.  Thrombocytopenia possible due to anti epileptic therapy.  Follows commands but is with encephalopathy.  04/19/24: Again requiring Dopamine  to maintain BP, with attempt to wean, again became hypotensive and bradycardic.  Check cortisol, start stress dose steroids. Consult Cardiology.  04/20/24: No significant events noted overnight.  Remains on Dopamine  ~ able to be weaned off Dopamine  later in the morning.   AKI improving, Creatinine down to 2.1 from 2.4, UOP 800 cc last 24 hrs (net + 3 L).   I assumed care from PCCM on 7/23, pt weaned off vasopressors with fairly stable BP's on midodrine  and stress dose steroids.  7/23--24 -- weaning down stress steroids.  BP's stable in 110's systolic. 7/25 -- weaned off steroids, only on midodrine , BP's stable 7/26 -- BP's stable, decreased midodrine    Assessment and Plan:  Acute toxic metabolic encephalopathy  Hx of Seizure disorder Hx of Chronic pain  Recurrent falls  Hx: Lewy body dementia, meningitis s/p VP shunt, and epilepsy  MRI Brain negative for acute intracranial process UDS and Ethanol is negative --Mgmt of underlying conditions as outlined --Delirium precautions --Avoid sedating medications as much as possible --Continue home Valproate  --PT/OT evaluations -- SNF recommended   Multifactorial Shock: hypovolemia and possible medication induced due to polypharmacy +/- Adrenal Insufficiency vs ? Autonomic Dysfunction (given his Lewy Body Dementia) Sinus bradycardia  Hx: HLD and HTN  Echocardiogram 04/17/24: LVEF 60-65%, grade I DD, RV systolic function is normal, RV size normal Lactic acid has normalized HS Troponin is normal TSH was normal --Telemetry --Maintain MAP >  65 --Further reduce midodrine  from 10 >> 5 >> 2.5 mg TID --Weaned off IV stress dose steroids -- as of evening 7/24 -- Bp's  stable --Cardiology following, appreciate input ~ not a good candidate for PPM  Hypophosphatemia -- phos 2.1 >> 2.2 --2 more days oral phos-NaK packets ordered to replace --Monitor & replace phos PRN   Emphysema/COPD without exacerbation  -Supplemental O2 as needed to maintain O2 sats 88 to 92% -Follow intermittent Chest X-ray & ABG as needed -Bronchodilators  -Pulmonary toilet as able   Acute kidney injury superimposed on CKD stage IIIb ~ IMPROVING Hypernatremia ~ RESOLVED -Monitor I&O's / urinary output -Follow BMP -Ensure adequate renal perfusion -Avoid nephrotoxic agents as able -Replace electrolytes as indicated ~ Pharmacy following for assistance with electrolyte replacement   Leukopenia UA without evidence of UTI, Chest X-ray without evidence of pneumonia, abdominal exam is benign, CT abdomen pelvis without acute process --Monitor fever curve, CBC --Follow cultures  --Hold off empiric ABX pending cultures & sensitivities --Monitor clinically for s/sx's of infection   Acute on Chronic Thrombocytopenia Macrocytic Anemia ~ no signs of active bleeding -Monitor for S/Sx of bleeding -Trend CBC -SCD's for VTE Prophylaxis (no chemical ppx given thrombocytopenia) -Transfuse for Hgb <7 -Transfuse platelets for platelet count <10 K , <50 K with active bleeding, or <100 K with Neurosurgical procedures/processes   Type II Diabetes Mellitus -CBG's change to AC/HS -Target range of 140 to 180 -Sliding scale Novolog  was dc'd    Minimally displaced Acromial Fracture of Left Shoulder -Orthopedics following, appreciate input ~ no treatment or restrictions at this time, continue with PT/OT   Prognosis -- pt's short-term prognosis remains guarded and high risk for clinical decompensation, cardiac arrest and death.  Given superimposed multiple other co-morbid conditions, long term prognosis overall is poor.  Code status DNR/DNI. --Consider Palliative Care consultation for GOC  discussions pending clinical course      Subjective: Pt awake resting in bed this AM.  He reports doing well, has no acute complaints.  Asks when he can go to [facility] for rehab.   Physical Exam: Vitals:   04/25/24 0343 04/25/24 0353 04/25/24 0422 04/25/24 0745  BP: (!) 165/132 115/60  99/63  Pulse: (!) 55 74  (!) 54  Resp: 20   16  Temp: 98.6 F (37 C)   97.9 F (36.6 C)  TempSrc: Oral   Oral  SpO2: 100%   100%  Weight:   86 kg   Height:       General exam: awake & alert, no acute distress, chronically ill appearing HEENT: moist mucus membranes, hearing grossly normal  Respiratory system: on room air, normal respiratory effort.  CTAB no wheezes or rhonchi Cardiovascular system: RRR, no pedal edema.  Normal S1/S2 Gastrointestinal system: soft, NT, ND Central nervous system: A&O x self and hospital. no gross focal neurologic deficits, normal speech Skin: dry, intact, normal temperature Psychiatry: normal mood, congruent affect, confused   Data Reviewed:  Notable labs --   Cr 2.35 >> 1.83 >> 2.01 >> 2.15 >> 2.10 stable Glucose 120 BUN 55 Ca 8.4 Phos 2.1 >> 2.2  Last CBC 7/26:  WBC 3.7 Hbg 8.2 >> 8.9 >> 8.5 >> 7.9  Platelets 26 >> 26 >> 32 >> 46    Family Communication: updated daughter by phone afternoon 7/25    Disposition: Status is: Inpatient Remains inpatient appropriate because: needs SNF placement   Planned Discharge Destination: Skilled nursing facility    Time spent: 35 minutes  Author: Burnard DELENA Cunning, DO 04/25/2024 11:51 AM  For on call review [email or web address].

## 2024-04-25 NOTE — TOC Progression Note (Signed)
 Transition of Care [facility]) - Progression Note    Patient Details  Name: Albert Figueroa MRN: [id number] Date of Birth: 20-Mar-1956  Transition of Care [facility]) CM/SW Contact  [place], [place] Phone Number: 04/25/2024, 12:16 PM  Clinical Narrative:     Patient is medically cleared for discharge. Patient only wants to go to [facility]. CSW contacted patient daughter to confirm this information. CSW advised that [facility] is the preferred choice. Daughter gave no other options for SNF.   FL2, Notes and Summaries sent through [facility].   Expected Discharge Plan: Home w Home Health Services                 Expected Discharge Plan and Services       Living arrangements for the past 2 months: Apartment                                       Social Drivers of Health (SDOH) Interventions SDOH Screenings   Food Insecurity: Patient Unable To Answer (04/17/2024)  Housing: Unknown (04/17/2024)  Transportation Needs: Patient Unable To Answer (04/17/2024)  Utilities: Patient Unable To Answer (04/17/2024)  Financial Resource Strain: High Risk (10/06/2023)   Received from [facility]  Social Connections: Patient Unable To Answer (04/17/2024)  Tobacco Use: High Risk (04/16/2024)    Readmission Risk Interventions    12/30/2023   12:01 PM 03/06/2023    3:23 PM  Readmission Risk Prevention Plan  Transportation Screening Complete Complete  PCP or Specialist Appt within 3-5 Days Complete Complete  HRI or Home Care Consult  --  Social Work Consult for Recovery Care Planning/Counseling Complete Complete  Palliative Care Screening Not Applicable Not Applicable  Medication Review Oceanographer) Complete Complete

## 2024-04-25 NOTE — Plan of Care (Signed)

## 2024-04-25 NOTE — Plan of Care (Signed)
  Problem: Education: Goal: Knowledge of General Education information will improve Description: Including pain rating scale, medication(s)/side effects and non-pharmacologic comfort measures 04/25/2024 1931 by Paulo Medford LITTIE Mickey., RN Outcome: Progressing 04/25/2024 1930 by Paulo Medford LITTIE Mickey., RN Outcome: Progressing 04/25/2024 1903 by Paulo Medford LITTIE Mickey., RN Outcome: Progressing   Problem: Health Behavior/Discharge Planning: Goal: Ability to manage health-related needs will improve 04/25/2024 1931 by Paulo Medford LITTIE Mickey., RN Outcome: Progressing 04/25/2024 1930 by Paulo Medford LITTIE Mickey., RN Outcome: Progressing 04/25/2024 1903 by Paulo Medford LITTIE Mickey., RN Outcome: Progressing   Problem: Clinical Measurements: Goal: Ability to maintain clinical measurements within normal limits will improve 04/25/2024 1931 by Paulo Medford LITTIE Mickey., RN Outcome: Progressing 04/25/2024 1930 by Paulo Medford LITTIE Mickey., RN Outcome: Progressing 04/25/2024 1903 by Paulo Medford LITTIE Mickey., RN Outcome: Progressing Goal: Will remain free from infection 04/25/2024 1931 by Paulo Medford LITTIE Mickey., RN Outcome: Progressing 04/25/2024 1930 by Paulo Medford LITTIE Mickey., RN Outcome: Progressing 04/25/2024 1903 by Paulo Medford LITTIE Mickey., RN Outcome: Progressing Goal: Diagnostic test results will improve 04/25/2024 1931 by Paulo Medford LITTIE Mickey., RN Outcome: Progressing 04/25/2024 1930 by Paulo Medford LITTIE Mickey., RN Outcome: Progressing 04/25/2024 1903 by Paulo Medford LITTIE Mickey., RN Outcome: Progressing Goal: Respiratory complications will improve 04/25/2024 1931 by Paulo Medford LITTIE Mickey., RN Outcome: Progressing 04/25/2024 1930 by Paulo Medford LITTIE Mickey., RN Outcome: Progressing Goal: Cardiovascular complication will be avoided Outcome: Progressing

## 2024-04-25 NOTE — Plan of Care (Signed)
  Problem: Education: Goal: Knowledge of General Education information will improve Description: Including pain rating scale, medication(s)/side effects and non-pharmacologic comfort measures 04/25/2024 1930 by Paulo Medford LITTIE Mickey., RN Outcome: Progressing 04/25/2024 1903 by Paulo Medford LITTIE Mickey., RN Outcome: Progressing   Problem: Health Behavior/Discharge Planning: Goal: Ability to manage health-related needs will improve 04/25/2024 1930 by Paulo Medford LITTIE Mickey., RN Outcome: Progressing 04/25/2024 1903 by Paulo Medford LITTIE Mickey., RN Outcome: Progressing   Problem: Clinical Measurements: Goal: Ability to maintain clinical measurements within normal limits will improve 04/25/2024 1930 by Paulo Medford LITTIE Mickey., RN Outcome: Progressing 04/25/2024 1903 by Paulo Medford LITTIE Mickey., RN Outcome: Progressing Goal: Will remain free from infection 04/25/2024 1930 by Paulo Medford LITTIE Mickey., RN Outcome: Progressing 04/25/2024 1903 by Paulo Medford LITTIE Mickey., RN Outcome: Progressing Goal: Diagnostic test results will improve 04/25/2024 1930 by Paulo Medford LITTIE Mickey., RN Outcome: Progressing 04/25/2024 1903 by Paulo Medford LITTIE Mickey., RN Outcome: Progressing Goal: Respiratory complications will improve Outcome: Progressing Goal: Cardiovascular complication will be avoided Outcome: Progressing

## 2024-04-26 DIAGNOSIS — R571 Hypovolemic shock: Secondary | ICD-10-CM | POA: Diagnosis not present

## 2024-04-26 LAB — IRON AND TIBC
Iron: 58 ug/dL (ref 45–182)
Saturation Ratios: 20 % (ref 17.9–39.5)
TIBC: 298 ug/dL (ref 250–450)
UIBC: 240 ug/dL

## 2024-04-26 LAB — CBC
HCT: 22.6 % — ABNORMAL LOW (ref 39.0–52.0)
Hemoglobin: 7.4 g/dL — ABNORMAL LOW (ref 13.0–17.0)
MCH: 34.1 pg — ABNORMAL HIGH (ref 26.0–34.0)
MCHC: 32.7 g/dL (ref 30.0–36.0)
MCV: 104.1 fL — ABNORMAL HIGH (ref 80.0–100.0)
Platelets: 68 K/uL — ABNORMAL LOW (ref 150–400)
RBC: 2.17 MIL/uL — ABNORMAL LOW (ref 4.22–5.81)
RDW: 15.9 % — ABNORMAL HIGH (ref 11.5–15.5)
WBC: 4.5 K/uL (ref 4.0–10.5)
nRBC: 0 % (ref 0.0–0.2)

## 2024-04-26 LAB — RETICULOCYTES
Immature Retic Fract: 25.6 % — ABNORMAL HIGH (ref 2.3–15.9)
RBC.: 2.16 MIL/uL — ABNORMAL LOW (ref 4.22–5.81)
Retic Count, Absolute: 123.6 K/uL (ref 19.0–186.0)
Retic Ct Pct: 5.7 % — ABNORMAL HIGH (ref 0.4–3.1)

## 2024-04-26 LAB — VITAMIN B12: Vitamin B-12: 481 pg/mL (ref 180–914)

## 2024-04-26 LAB — GLUCOSE, CAPILLARY
Glucose-Capillary: 78 mg/dL (ref 70–99)
Glucose-Capillary: 77 mg/dL (ref 70–99)
Glucose-Capillary: 108 mg/dL — ABNORMAL HIGH (ref 70–99)

## 2024-04-26 LAB — FOLATE: Folate: 9.7 ng/mL (ref >5.9)

## 2024-04-26 LAB — PHOSPHORUS: Phosphorus: 2.2 mg/dL — ABNORMAL LOW (ref 2.5–4.6)

## 2024-04-26 LAB — FERRITIN: Ferritin: 102 ng/mL (ref 24–336)

## 2024-04-26 NOTE — Progress Notes (Signed)
 Physical Therapy Treatment Patient Details Name: Albert Figueroa MRN: [id number] DOB: 1956/08/10 Today's Date: 04/26/2024   History of Present Illness Pt is a 68 y.o. male who presented to the ER with acute onset of generalized weakness and concern for dehydration with recent recurrent falls. Admitted for management of  Hypovolemic shock, acute metabolic encephalopathy, and sinus bradycardia. 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 Pt received in recliner and excited to eat his pizza in recliner. Pt is making progress towards goals with ability to follow commands consistently. Will continue to progress.    If plan is discharge home, recommend the following: A lot of help with walking and/or transfers;A lot of help with bathing/dressing/bathroom   Can travel by private vehicle     No  Equipment Recommendations  None recommended by PT    Recommendations for Other Services       Precautions / Restrictions Precautions Precautions: Fall Recall of Precautions/Restrictions: Impaired Restrictions Weight Bearing Restrictions Per Provider Order: No     Mobility  Bed Mobility               General bed mobility comments: NT, received in recliner    Transfers Overall transfer level: Needs assistance Equipment used: Rolling walker (2 wheels) Transfers: Sit to/from Stand Sit to Stand: Min assist           General transfer comment: able to stand with assist for lift off and correct use of RW.    Ambulation/Gait               General Gait Details: declined as lunch arriving, requested to return to recliner for meal   Stairs             Wheelchair Mobility     Tilt Bed    Modified  Rankin (Stroke Patients Only)       Balance Overall balance assessment: Needs assistance Sitting-balance support: Feet supported, Single extremity supported Sitting balance-Leahy Scale: Good     Standing balance support: Bilateral upper extremity supported Standing balance-Leahy Scale: Fair                              Communication Communication Communication: Impaired Factors Affecting Communication: Reduced clarity of speech;Hearing impaired  Cognition Arousal: Alert Behavior During Therapy: WFL for tasks assessed/performed   PT - Cognitive impairments: No family/caregiver present to determine baseline                       PT - Cognition Comments: repeats self, confused to situation Following commands: Impaired Following commands impaired: Follows multi-step commands inconsistently    Cueing Cueing Techniques: Verbal cues, Tactile cues, Visual cues  Exercises Other Exercises Other Exercises: 3 STS with RW used with cues for lift off, hand placement. Once standing 2 x 10 reps of alternating standing marching. Seated mod abdominal pulls in recliner x 10. Cues for attention to task    General Comments        Pertinent Vitals/Pain Pain Assessment Pain Assessment: No/denies pain    Home Living  Prior Function            PT Goals (current goals can now be found in the care plan section) Acute Rehab PT Goals Patient Stated Goal: return home PT Goal Formulation: With patient Time For Goal Achievement: 05/02/24 Potential to Achieve Goals: Fair Progress towards PT goals: Progressing toward goals    Frequency    Min 2X/week      PT Plan      Co-evaluation              AM-PAC PT 6 Clicks Mobility   Outcome Measure  Help needed turning from your back to your side while in a flat bed without using bedrails?: A Little Help needed moving from lying on your back to sitting on the side of a flat  bed without using bedrails?: A Little Help needed moving to and from a bed to a chair (including a wheelchair)?: A Little Help needed standing up from a chair using your arms (e.g., wheelchair or bedside chair)?: A Little Help needed to walk in hospital room?: A Lot Help needed climbing 3-5 steps with a railing? : Total 6 Click Score: 15    End of Session   Activity Tolerance: Patient tolerated treatment well Patient left: in chair;with chair alarm set Nurse Communication: Mobility status PT Visit Diagnosis: Difficulty in walking, not elsewhere classified (R26.2);Muscle weakness (generalized) (M62.81)     Time: 8942-8890 PT Time Calculation (min) (ACUTE ONLY): 12 min  Charges:    $Therapeutic Exercise: 8-22 mins PT General Charges $$ ACUTE PT VISIT: 1 Visit                     Corean Dade, PT, DPT, GCS [phone]    Nirvana Blanchett 04/26/2024, 2:16 PM

## 2024-04-26 NOTE — Progress Notes (Addendum)
 Progress Note   Patient: Albert Figueroa FMW:[id number] DOB: [birth year] DOA: 04/16/2024     9 DOS: the patient was seen and examined on 04/26/2024   Brief hospital course:  Albert Figueroa is a 68 y.o. 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  Pt previously hospitalized 04/17 to 04/21 with hypotension/sinus bradycardia attributed to AKI, and opiate overdose requiring narcan  infusion.     ED Course  Upon arrival to the ER CT Head/Cervical Spine negative for acute intracranial abnormality and acute fracture/traumatic malalignment of the cervical spine.  Significant lab results were: K+ 3.3/glucose 118/BUN 49/creatinine 3.49/calcium  8.6/albumin 2.8/total bilirubin 1.6/hgb 9.2/platelet count 52.  CT Abd/Pelvis revealed no acute intra-abdominal or intrapelvic abnormality, but revealed trace simple free fluid ascites likely due to VP shunt.  While in the ER pt received 1L NS bolus and 20 g of lactulose .  Pt initially admitted to the stepdown unit per hospitalist team but remained in the ER pending bed availability.  However, while holding in the ER pt c/o pain and received scheduled 20 mg oxycodone  ir.  He became altered, hypotensive, and bradycardic with pinpoint pupils.  He received narcan , atropine , and briefly started on levophed  gtt.   Patient was admitted to stepdown unit with PCCM consulted.  BP's improved, but later required pressors again and PCCM was re-consulted and treated with Dopamine , stress dose IV steroids.  Cardiology was consulted.  Summary of significant hospital events: 07/18: Pt admitted to the stepdown unit with hypovolemic shock and acute toxic metabolic encephalopathy   07/19: Pt developed recurrent hypotension, sinus bradycardia, and worsening acute toxic metabolic  encephalopathy requiring levophed  gtt.  Pt transferred to ICU and PCCM team consulted  04/18/24- patient off vasopressors and ionotropes today.  Thrombocytopenia possible due to anti epileptic therapy.  Follows commands but is with encephalopathy.  04/19/24: Again requiring Dopamine  to maintain BP, with attempt to wean, again became hypotensive and bradycardic.  Check cortisol, start stress dose steroids. Consult Cardiology.  04/20/24: No significant events noted overnight.  Remains on Dopamine  ~ able to be weaned off Dopamine  later in the morning.   AKI improving, Creatinine down to 2.1 from 2.4, UOP 800 cc last 24 hrs (net + 3 L).   I assumed care from PCCM on 7/23, pt weaned off vasopressors with fairly stable BP's on midodrine  and stress dose steroids.  7/23--24 -- weaning down stress steroids.  BP's stable in 110's systolic. 7/25 -- weaned off steroids, only on midodrine , BP's stable 7/26 -- BP's stable, decreased midodrine  727 -- further decreased midodrine .  Awaiting SNF placement.   Assessment and Plan:  Acute toxic metabolic encephalopathy  Hx of Seizure disorder Hx of Chronic pain  Recurrent falls  Hx: Lewy body dementia, meningitis s/p VP shunt, and epilepsy  MRI Brain negative for acute intracranial process UDS and Ethanol is negative --Mgmt of underlying conditions as outlined --Delirium precautions --Avoid sedating medications as much as possible --Continue home Valproate  --PT/OT evaluations -- SNF recommended   Multifactorial Shock: hypovolemia and possible medication induced due to polypharmacy +/- Adrenal Insufficiency vs ? Autonomic Dysfunction (given his Lewy Body Dementia) Sinus bradycardia  Hx: HLD and HTN  Echocardiogram 04/17/24: LVEF 60-65%, grade I DD, RV systolic function is normal, RV size normal Lactic acid has normalized HS  Troponin is normal TSH was normal --Telemetry --Maintain MAP >65 --Further reduce midodrine  from 10 >> 5 >> 2.5 mg TID --Will try  stopping midodrine  if BP's on 2.5 mg dose stable enough --Weaned off IV stress dose steroids -- as of evening 7/24 -- Bp's remained stable --Cardiology following, appreciate input ~ not a good candidate for PPM  Hypophosphatemia -- phos 2.1 >> 2.2 >> 2.2 --Additional oral phos-NaK packets ordered to replace --Monitor & replace phos PRN   Emphysema/COPD without exacerbation  -Supplemental O2 as needed to maintain O2 sats 88 to 92% -Follow intermittent Chest X-ray & ABG as needed -Bronchodilators  -Pulmonary toilet as able   Acute kidney injury superimposed on CKD stage IIIb ~ IMPROVING Hypernatremia ~ RESOLVED -Monitor I&O's / urinary output -Follow BMP -Ensure adequate renal perfusion -Avoid nephrotoxic agents as able -Replace electrolytes as indicated ~ Pharmacy following for assistance with electrolyte replacement   Leukopenia UA without evidence of UTI, Chest X-ray without evidence of pneumonia, abdominal exam is benign, CT abdomen pelvis without acute process --Monitor fever curve, CBC --Follow cultures  --Hold off empiric ABX pending cultures & sensitivities --Monitor clinically for s/sx's of infection   Acute on Chronic Thrombocytopenia Macrocytic Anemia ~ no signs of active bleeding -Monitor for S/Sx of bleeding -Trend CBC -SCD's for VTE Prophylaxis (no chemical ppx given thrombocytopenia) -Transfuse for Hgb <7 -Transfuse platelets for platelet count <10 K , <50 K with active bleeding, or <100 K with Neurosurgical procedures/processes   Type II Diabetes Mellitus -CBG's change to AC/HS -Target range of 140 to 180 -Sliding scale Novolog  was dc'd    Minimally displaced Acromial Fracture of Left Shoulder -Orthopedics following, appreciate input ~ no treatment or restrictions at this time, continue with PT/OT   Prognosis -- pt's short-term prognosis remains guarded and high risk for clinical decompensation, cardiac arrest and death.  Given superimposed multiple other  co-morbid conditions, long term prognosis overall is poor.  Code status DNR/DNI. --Consider Palliative Care consultation for GOC discussions pending clinical course      Subjective: Pt up in recliner this AM on rounds.  Says he feels great, better than he's felt in years.  Asking about discharge.  States he cannot stay in bed all the time, it makes his pain much worse.  Denies uncontrolled pain today.   Physical Exam: Vitals:   04/25/24 1544 04/25/24 2014 04/26/24 0319 04/26/24 0746  BP: 104/66 109/61 (!) 105/57 (!) 97/56  Pulse: 65 65 69 63  Resp: 17 18 18 18   Temp: 98.5 F (36.9 C) 98 F (36.7 C) 98.5 F (36.9 C) 98.7 F (37.1 C)  TempSrc: Oral  Oral   SpO2: 100% 100% 97% 100%  Weight:   86.7 kg   Height:       General exam: awake & alert, no acute distress, chronically ill appearing HEENT: moist mucus membranes, hearing grossly normal  Respiratory system: on room air, normal respiratory effort.  CTAB no wheezes or rhonchi Cardiovascular system: RRR, no pedal edema.  Normal S1/S2 Gastrointestinal system: soft, NT, ND Central nervous system: A&O x self and hospital. no gross focal neurologic deficits, normal speech Skin: dry, intact, normal temperature Psychiatry: normal mood, congruent affect, confused   Data Reviewed:  Notable labs --   Cr trend 2.35 >> 1.83 >> 2.01 >> 2.15 >> 2.10 stable  Phos 2.1 >> 2.2 >> 2.2  Hbg 8.2 >> 8.9 >> 8.5 >> 7.9  >> 7.4 Platelets 26 >> 26 >> 32 >> 46 >> 68  Family Communication: updated daughter by phone afternoon 7/25    Disposition: Status is: Inpatient Remains inpatient appropriate because: needs SNF placement   Planned Discharge Destination: Skilled nursing facility    Time spent: 35 minutes  Author: Burnard DELENA Cunning, DO 04/26/2024 12:23 PM  For on call review [email or web address].

## 2024-04-26 NOTE — TOC Progression Note (Signed)
 Transition of Care [facility]) - Progression Note    Patient Details  Name: Albert Figueroa MRN: [id number] Date of Birth: 04-22-1956  [facility]) CM/SW Contact  [name], RN Phone Number: 04/26/2024, 2:58 PM  Clinical Narrative:     Bed offers presented to patient. Patient accepts bed at [facility] Accepted in HUB, and notified [name] at [facility] Per patient request called daughter [name] to update.  Her VM was not set up  Requested [place] with TOC to start auth   Expected Discharge Plan: Home w Home Health Services                 Expected Discharge Plan and Services       Living arrangements for the past 2 months: Apartment                                       Social Drivers of Health (SDOH) Interventions SDOH Screenings   Food Insecurity: Patient Unable To Answer (04/17/2024)  Housing: Unknown (04/17/2024)  Transportation Needs: Patient Unable To Answer (04/17/2024)  Utilities: Patient Unable To Answer (04/17/2024)  Financial Resource Strain: High Risk (10/06/2023)   Received from [facility]  Social Connections: Patient Unable To Answer (04/17/2024)  Tobacco Use: High Risk (04/16/2024)    Readmission Risk Interventions    12/30/2023   12:01 PM 03/06/2023    3:23 PM  Readmission Risk Prevention Plan  Transportation Screening Complete Complete  PCP or Specialist Appt within 3-5 Days Complete Complete  HRI or Home Care Consult  --  Social Work Consult for Recovery Care Planning/Counseling Complete Complete  Palliative Care Screening Not Applicable Not Applicable  Medication Review Oceanographer) Complete Complete

## 2024-04-27 DIAGNOSIS — R571 Hypovolemic shock: Secondary | ICD-10-CM | POA: Diagnosis not present

## 2024-04-27 LAB — CBC
HCT: 23.1 % — ABNORMAL LOW (ref 39.0–52.0)
Hemoglobin: 7.3 g/dL — ABNORMAL LOW (ref 13.0–17.0)
MCH: 33.6 pg (ref 26.0–34.0)
MCHC: 31.6 g/dL (ref 30.0–36.0)
MCV: 106.5 fL — ABNORMAL HIGH (ref 80.0–100.0)
Platelets: 73 K/uL — ABNORMAL LOW (ref 150–400)
RBC: 2.17 MIL/uL — ABNORMAL LOW (ref 4.22–5.81)
RDW: 16.3 % — ABNORMAL HIGH (ref 11.5–15.5)
WBC: 3.9 K/uL — ABNORMAL LOW (ref 4.0–10.5)
nRBC: 0 % (ref 0.0–0.2)

## 2024-04-27 LAB — BASIC METABOLIC PANEL WITH GFR
Anion gap: 5 (ref 5–15)
BUN: 50 mg/dL — ABNORMAL HIGH (ref 8–23)
CO2: 25 mmol/L (ref 22–32)
Calcium: 8.3 mg/dL — ABNORMAL LOW (ref 8.9–10.3)
Chloride: 111 mmol/L (ref 98–111)
Creatinine, Ser: 1.95 mg/dL — ABNORMAL HIGH (ref 0.61–1.24)
GFR, Estimated: 37 mL/min — ABNORMAL LOW (ref >60)
Glucose, Bld: 92 mg/dL (ref 70–99)
Potassium: 4.5 mmol/L (ref 3.5–5.1)
Sodium: 141 mmol/L (ref 135–145)

## 2024-04-27 LAB — GLUCOSE, CAPILLARY
Glucose-Capillary: 69 mg/dL — ABNORMAL LOW (ref 70–99)
Glucose-Capillary: 101 mg/dL — ABNORMAL HIGH (ref 70–99)
Glucose-Capillary: 102 mg/dL — ABNORMAL HIGH (ref 70–99)
Glucose-Capillary: 112 mg/dL — ABNORMAL HIGH (ref 70–99)

## 2024-04-27 LAB — PREPARE RBC (CROSSMATCH)

## 2024-04-27 LAB — PHOSPHORUS: Phosphorus: 2.9 mg/dL (ref 2.5–4.6)

## 2024-04-27 MED ORDER — SODIUM CHLORIDE 0.9% IV SOLUTION: Freq: Once | INTRAVENOUS | Status: AC

## 2024-04-27 MED ORDER — POLYETHYLENE GLYCOL 3350 17 G PO PACK
17.0000 g | PACK | Freq: Every day | ORAL | Status: DC
  Administered 2024-04-27: 17 g via ORAL
  Filled 2024-04-27: qty 1

## 2024-04-27 NOTE — Progress Notes (Signed)
 Occupational Therapy Treatment Patient Details Name: Albert Figueroa MRN: [id number] DOB: 1956/05/20 Today's Date: 04/27/2024   History of present illness Pt is a 68 y.o. male who presented to the ER with acute onset of generalized weakness and concern for dehydration with recent recurrent falls. Admitted for management of  Hypovolemic shock, acute metabolic encephalopathy, and sinus bradycardia. 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 He is pleasant and agreeable to OT session. He reports generalized pain in his back from laying in bed. Pt performed supine to sit at EOB with increased time, CGA. Pt required supervision for seated UB bathing at bedside and CGA for LB bathing in standing at RW. Min A to manage doffing soiled gown and donning clean one. Pt required Min/CGA for STS from EOB to RW and was able to step pivot to the recliner using RW with CGA. He is very talkative and ready to go to rehab. Pt left seated in recliner with all needs in place and will cont to require skilled acute OT services to maximize his safety and IND to return to PLOF.       If plan is discharge home, recommend the following:  Assistance with cooking/housework;Direct supervision/assist for financial management;Supervision due to cognitive status;Help with stairs or ramp for entrance;Direct supervision/assist for medications management;Assist for transportation;A little help with walking and/or transfers;A little help with bathing/dressing/bathroom   Equipment Recommendations  Other (comment) (defer)    Recommendations for Other Services      Precautions / Restrictions Precautions Precautions: Fall Recall of Precautions/Restrictions: Impaired Restrictions Weight Bearing  Restrictions Per Provider Order: No       Mobility Bed Mobility Overal bed mobility: Needs Assistance Bed Mobility: Supine to Sit     Supine to sit: Contact guard     General bed mobility comments: increased time, but no physical assist needed to reach EOB    Transfers Overall transfer level: Needs assistance Equipment used: Rolling walker (2 wheels) Transfers: Sit to/from Stand Sit to Stand: Contact guard assist, Min assist     Step pivot transfers: Contact guard assist     General transfer comment: Min/CGA for STS from lowest bed height and CGA with RW use to step pivot to recliner     Balance Overall balance assessment: Needs assistance Sitting-balance support: Feet supported, Single extremity supported Sitting balance-Leahy Scale: Good Sitting balance - Comments: sitting EOB   Standing balance support: During functional activity, Single extremity supported Standing balance-Leahy Scale: Fair Standing balance comment: CGA in standy at [facility] with unilateral support during LB bathing                           ADL either performed or assessed with clinical judgement   ADL Overall ADL's : Needs assistance/impaired     Grooming: Wash/dry face;Wash/dry hands;Set up;Sitting   Upper Body Bathing: Supervision/ safety;Sitting Upper Body Bathing Details (indicate cue type and reason): at EOB Lower Body Bathing: Contact guard assist;Sitting/lateral leans;Sit to/from stand Lower Body Bathing Details (indicate cue type and reason): in standing at EOB with cues for safety and Rw use Upper Body Dressing : Minimal assistance;Sitting Upper Body Dressing Details (indicate cue type and reason): to doff soiled gown and don clean one     Toilet  Transfer: Contact guard assist;Stand-pivot Statistician Details (indicate cue type and reason): simulated to recliner this date with RW use and CGA                Extremity/Trunk Assessment              Vision        Perception     Praxis     Communication Communication Communication: Impaired Factors Affecting Communication: Reduced clarity of speech;Hearing impaired   Cognition Arousal: Alert Behavior During Therapy: WFL for tasks assessed/performed Cognition: History of cognitive impairments                               Following commands: Impaired Following commands impaired: Only follows one step commands consistently      Cueing   Cueing Techniques: Verbal cues, Tactile cues, Visual cues  Exercises      Shoulder Instructions       General Comments no c/o of lightheadedness throughout session    Pertinent Vitals/ Pain       Pain Assessment Pain Assessment: Faces Faces Pain Scale: Hurts a little bit Pain Location: back Pain Descriptors / Indicators: Sore Pain Intervention(s): Monitored during session, Repositioned  Home Living                                          Prior Functioning/Environment              Frequency  Min 2X/week        Progress Toward Goals  OT Goals(current goals can now be found in the care plan section)  Progress towards OT goals: Progressing toward goals  Acute Rehab OT Goals Patient Stated Goal: go home OT Goal Formulation: With patient Time For Goal Achievement: 05/02/24 Potential to Achieve Goals: Fair  Plan      Co-evaluation                 AM-PAC OT 6 Clicks Daily Activity     Outcome Measure   Help from another person eating meals?: None Help from another person taking care of personal grooming?: None Help from another person toileting, which includes using toliet, bedpan, or urinal?: A Little Help from another person bathing (including washing, rinsing, drying)?: A Little Help from another person to put on and taking off regular upper body clothing?: A Little Help from another person to put on and taking off regular lower body clothing?: A Lot 6 Click Score: 19    End  of Session Equipment Utilized During Treatment: Rolling walker (2 wheels)  OT Visit Diagnosis: Other abnormalities of gait and mobility (R26.89);Muscle weakness (generalized) (M62.81)   Activity Tolerance Patient tolerated treatment well   Patient Left in chair;with call bell/phone within reach;with chair alarm set   Nurse Communication Mobility status        Time: 9176-9157 OT Time Calculation (min): 19 min  Charges: OT General Charges $OT Visit: 1 Visit OT Treatments $Self Care/Home Management : 8-22 mins  Zunairah Devers, OTR/L  04/27/24, 11:24 AM   Duwaine FORBES Saupe 04/27/2024, 11:24 AM

## 2024-04-27 NOTE — Plan of Care (Signed)
  Problem: Education: Goal: Knowledge of General Education information will improve Description: Including pain rating scale, medication(s)/side effects and non-pharmacologic comfort measures Outcome: Progressing   Problem: Health Behavior/Discharge Planning: Goal: Ability to manage health-related needs will improve Outcome: Progressing   Problem: Clinical Measurements: Goal: Ability to maintain clinical measurements within normal limits will improve Outcome: Progressing Goal: Will remain free from infection Outcome: Progressing Goal: Diagnostic test results will improve Outcome: Progressing Goal: Respiratory complications will improve Outcome: Progressing   Problem: Activity: Goal: Risk for activity intolerance will decrease Outcome: Progressing   Problem: Nutrition: Goal: Adequate nutrition will be maintained Outcome: Progressing   Problem: Elimination: Goal: Will not experience complications related to bowel motility Outcome: Progressing

## 2024-04-27 NOTE — TOC Progression Note (Signed)
 Transition of Care [facility]) - Progression Note    Patient Details  Name: Albert Figueroa MRN: [id number] Date of Birth: 1956/07/20  [facility]) CM/SW Contact  [name], RN Phone Number: 04/27/2024, 4:27 PM  Clinical Narrative:     Insurance auth for [facility] valid through tomorrow.  Per [name] at [facility] they can admit tomorrow.   MD aware that DNR is on chart to be signed   Expected Discharge Plan: Home w Home Health Services                 Expected Discharge Plan and Services       Living arrangements for the past 2 months: Apartment                                       Social Drivers of Health (SDOH) Interventions SDOH Screenings   Food Insecurity: Patient Unable To Answer (04/17/2024)  Housing: Unknown (04/17/2024)  Transportation Needs: Patient Unable To Answer (04/17/2024)  Utilities: Patient Unable To Answer (04/17/2024)  Financial Resource Strain: High Risk (10/06/2023)   Received from [facility]  Social Connections: Patient Unable To Answer (04/17/2024)  Tobacco Use: High Risk (04/16/2024)    Readmission Risk Interventions    12/30/2023   12:01 PM 03/06/2023    3:23 PM  Readmission Risk Prevention Plan  Transportation Screening Complete Complete  PCP or Specialist Appt within 3-5 Days Complete Complete  HRI or Home Care Consult  --  Social Work Consult for Recovery Care Planning/Counseling Complete Complete  Palliative Care Screening Not Applicable Not Applicable  Medication Review Oceanographer) Complete Complete

## 2024-04-27 NOTE — Progress Notes (Signed)
 Progress Note   Patient: Albert Figueroa FMW:[id number] DOB: 21-Oct-1955 DOA: 04/16/2024     10 DOS: the patient was seen and examined on 04/27/2024   Brief hospital course:  Albert Figueroa is a 68 y.o. 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  Pt previously hospitalized 04/17 to 04/21 with hypotension/sinus bradycardia attributed to AKI, and opiate overdose requiring narcan  infusion.     ED Course  Upon arrival to the ER CT Head/Cervical Spine negative for acute intracranial abnormality and acute fracture/traumatic malalignment of the cervical spine.  Significant lab results were: K+ 3.3/glucose 118/BUN 49/creatinine 3.49/calcium  8.6/albumin 2.8/total bilirubin 1.6/hgb 9.2/platelet count 52.  CT Abd/Pelvis revealed no acute intra-abdominal or intrapelvic abnormality, but revealed trace simple free fluid ascites likely due to VP shunt.  While in the ER pt received 1L NS bolus and 20 g of lactulose .  Pt initially admitted to the stepdown unit per hospitalist team but remained in the ER pending bed availability.  However, while holding in the ER pt c/o pain and received scheduled 20 mg oxycodone  ir.  He became altered, hypotensive, and bradycardic with pinpoint pupils.  He received narcan , atropine , and briefly started on levophed  gtt.   Patient was admitted to stepdown unit with PCCM consulted.  BP's improved, but later required pressors again and PCCM was re-consulted and treated with Dopamine , stress dose IV steroids.  Cardiology was consulted.  Summary of significant hospital events: 07/18: Pt admitted to the stepdown unit with hypovolemic shock and acute toxic metabolic encephalopathy   07/19: Pt developed recurrent hypotension, sinus bradycardia, and worsening acute toxic metabolic  encephalopathy requiring levophed  gtt.  Pt transferred to ICU and PCCM team consulted  04/18/24- patient off vasopressors and ionotropes today.  Thrombocytopenia possible due to anti epileptic therapy.  Follows commands but is with encephalopathy.  04/19/24: Again requiring Dopamine  to maintain BP, with attempt to wean, again became hypotensive and bradycardic.  Check cortisol, start stress dose steroids. Consult Cardiology.  04/20/24: No significant events noted overnight.  Remains on Dopamine  ~ able to be weaned off Dopamine  later in the morning.   AKI improving, Creatinine down to 2.1 from 2.4, UOP 800 cc last 24 hrs (net + 3 L).   I assumed care from PCCM on 7/23, pt weaned off vasopressors with fairly stable BP's on midodrine  and stress dose steroids.  7/23--24 -- weaning down stress steroids.  BP's stable in 110's systolic. 7/25 -- weaned off steroids, only on midodrine , BP's stable 7/26 -- BP's stable, decreased midodrine  727 -- further decreased midodrine .  Awaiting SNF placement.   Assessment and Plan:  Acute toxic metabolic encephalopathy  Hx of Seizure disorder Hx of Chronic pain  Recurrent falls  Hx: Lewy body dementia, meningitis s/p VP shunt, and epilepsy  MRI Brain negative for acute intracranial process UDS and Ethanol is negative --Mgmt of underlying conditions as outlined --Delirium precautions --Avoid sedating medications as much as possible --Continue home Valproate  --PT/OT evaluations -- SNF recommended   Multifactorial Shock: hypovolemia and possible medication induced due to polypharmacy +/- Adrenal Insufficiency vs ? Autonomic Dysfunction (given his Lewy Body Dementia) Sinus bradycardia  Hx: HLD and HTN  Echocardiogram 04/17/24: LVEF 60-65%, grade I DD, RV systolic function is normal, RV size normal Lactic acid has normalized HS  Troponin is normal TSH was normal --Telemetry --Maintain MAP >65 --Further reduce midodrine  from 10 >> 5 >> 2.5 mg TID --Can try  stopping midodrine  if BP's on 2.5 mg dose stable enough --Weaned off IV stress dose steroids -- as of evening 7/24 -- Bp's remained stable --Cardiology following, appreciate input ~ not a good candidate for PPM  Hypophosphatemia -- phos 2.1 >> 2.2 >> 2.2 >> 2.9 --Monitor & replace phos PRN   Emphysema/COPD without exacerbation  -Supplemental O2 as needed to maintain O2 sats 88 to 92% -Bronchodilators  -Pulmonary toilet   Acute kidney injury superimposed on CKD stage IIIb ~ IMPROVING Hypernatremia ~ RESOLVED -Monitor I&O's / urinary output -Follow BMP -Ensure adequate renal perfusion -Avoid nephrotoxic agents as able -Replace electrolytes as indicated ~ Pharmacy following for assistance with electrolyte replacement   Leukopenia UA without evidence of UTI, Chest X-ray without evidence of pneumonia, abdominal exam is benign, CT abdomen pelvis without acute process --Monitor fever curve, CBC --Blood cultures negative, final. --Hold off empiric ABX pending cultures & sensitivities --Monitor clinically for s/sx's of infection   Acute on Chronic Thrombocytopenia Acute on Chronic Macrocytic Anemia ~ no signs of active bleeding Hbg slowly trended down over several days to 7.3 today Has needed RBC transfusions during prior admissions - Type & Screen and transfuse 1 unit RBC's today -Check post-RBC Hbg level -Monitor for S/Sx of bleeding -Trend CBC -SCD's for VTE Prophylaxis (no chemical ppx given thrombocytopenia) -Transfuse platelets for platelet count <10 K , <50 K with active bleeding, or <100 K with Neurosurgical procedures/processes -Consider referral to outpatient hematology for chronic management   Type II Diabetes Mellitus -CBG's change to AC/HS -Target range of 140 to 180 -Sliding scale Novolog  was dc'd    Minimally displaced Acromial Fracture of Left Shoulder -Orthopedics following, appreciate input ~ no treatment or restrictions at this time, continue with  PT/OT   Prognosis -- pt's short-term prognosis remains guarded and high risk for clinical decompensation, cardiac arrest and death.  Given superimposed multiple other co-morbid conditions, long term prognosis overall is poor.  Code status DNR/DNI. --Consider Palliative Care consultation for GOC discussions pending clinical course      Subjective: Pt up in recliner this AM on rounds.  He denies any complaints, says he feels great and hopes to go to rehab today.     Physical Exam: Vitals:   04/26/24 1934 04/27/24 0135 04/27/24 0337 04/27/24 0749  BP: 122/81  115/62 96/63  Pulse: 66  (!) 58 (!) 57  Resp: 18  18 14   Temp: 97.6 F (36.4 C)  97.6 F (36.4 C) 98 F (36.7 C)  TempSrc: Oral  Oral   SpO2: 100%  100% 100%  Weight:  85.9 kg    Height:       General exam: awake & alert, no acute distress, chronically ill appearing HEENT: moist mucus membranes, hearing grossly normal  Respiratory system: on room air, normal respiratory effort  Cardiovascular system: RRR, no pedal edema   Gastrointestinal system: soft, NT, ND Central nervous system: A&O x self and hospital. no gross focal neurologic deficits, normal speech Skin: dry, intact, normal temperature Psychiatry: normal mood, congruent affect, confused   Data Reviewed:  Notable labs --   Cr trend 2.35 >> 1.83 >> 2.01 >> 2.15 >> 2.10 >> 1.95 stable  Hbg 8.2 >> 8.9 >> 8.5 >> 7.9  >> 7.4 >> 7.3 Platelets 26 >> 26 >> 32 >> 46 >> 68 >> 73    Family  Communication: updated daughter by phone this afternoon     Disposition: Status is: Inpatient Remains inpatient appropriate because: needs SNF placement   Planned Discharge Destination: Skilled nursing facility    Time spent: 40 minutes  Author: Burnard DELENA Cunning, DO 04/27/2024 1:05 PM  For on call review [email or web address].

## 2024-04-28 DIAGNOSIS — R571 Hypovolemic shock: Secondary | ICD-10-CM | POA: Diagnosis not present

## 2024-04-28 LAB — CBC
HCT: 24.7 % — ABNORMAL LOW (ref 39.0–52.0)
Hemoglobin: 8.2 g/dL — ABNORMAL LOW (ref 13.0–17.0)
MCH: 32.7 pg (ref 26.0–34.0)
MCHC: 33.2 g/dL (ref 30.0–36.0)
MCV: 98.4 fL (ref 80.0–100.0)
Platelets: 95 K/uL — ABNORMAL LOW (ref 150–400)
RBC: 2.51 MIL/uL — ABNORMAL LOW (ref 4.22–5.81)
RDW: 23.3 % — ABNORMAL HIGH (ref 11.5–15.5)
WBC: 4.3 K/uL (ref 4.0–10.5)
nRBC: 0 % (ref 0.0–0.2)

## 2024-04-28 LAB — GLUCOSE, CAPILLARY
Glucose-Capillary: 92 mg/dL (ref 70–99)
Glucose-Capillary: 98 mg/dL (ref 70–99)

## 2024-04-28 MED ORDER — MIDODRINE HCL 2.5 MG PO TABS: 2.5000 mg | ORAL_TABLET | Freq: Three times a day (TID) | ORAL | Status: DC

## 2024-04-28 MED ORDER — ADULT MULTIVITAMIN W/MINERALS CH: 1.0000 | ORAL_TABLET | Freq: Every day | ORAL | Status: DC

## 2024-04-28 MED ORDER — NALOXONE HCL 4 MG/0.1ML NA LIQD: 1.0000 | Freq: Once | NASAL | Status: DC | PRN

## 2024-04-28 MED ORDER — CYCLOBENZAPRINE HCL 7.5 MG PO TABS: 7.5000 mg | ORAL_TABLET | Freq: Three times a day (TID) | ORAL | Status: DC | PRN

## 2024-04-28 MED ORDER — POLYETHYLENE GLYCOL 3350 17 G PO PACK: 17.0000 g | PACK | Freq: Every day | ORAL | Status: DC

## 2024-04-28 MED ORDER — OXYCODONE HCL 20 MG PO TABS: 10.0000 mg | ORAL_TABLET | ORAL | 0 refills | Status: DC | PRN

## 2024-04-28 MED ORDER — ENSURE PLUS HIGH PROTEIN PO LIQD: 237.0000 mL | Freq: Three times a day (TID) | ORAL | Status: DC

## 2024-04-28 NOTE — TOC Transition Note (Signed)
 Transition of Care [facility]) - Discharge Note   Patient Details  Name: Albert Figueroa MRN: [id number] Date of Birth: Feb 17, 1956  Transition of Care [facility] CM/SW Contact:  Asberry CHRISTELLA Jaksch, RN Phone Number: 04/28/2024, 1:48 PM   Clinical Narrative:    Patient will DC to: [facility] Anticipated DC date: 04/28/2024 Family notified: Attempted to call daughter [name] and unable to leave a voicemail, voicemail box not setup.  Transport by: [facility]  Per MD patient ready for DC to . RN, patient, patient's family, and facility notified of DC. Discharge Summary sent to facility. RN given number for report. DC packet on chart. Ambulance transport requested for patient.   TOC signing off.    Final next level of care: Skilled Nursing Facility Barriers to Discharge: Barriers Resolved   Patient Goals and CMS Choice Patient states their goals for this hospitalization and ongoing recovery are:: Go home CMS Medicare.gov Compare Post Acute Care list provided to:: Patient Choice offered to / list presented to : Patient      Discharge Placement              Patient chooses bed at: [facility] [place] Patient to be transferred to facility by: [facility] Name of family member notified: Attempted to call daughter [name] she has no voicemail setup Patient and family notified of of transfer: 04/28/24  Discharge Plan and Services Additional resources added to the After Visit Summary for                                       Social Drivers of Health (SDOH) Interventions SDOH Screenings   Food Insecurity: Patient Unable To Answer (04/17/2024)  Housing: Unknown (04/17/2024)  Transportation Needs: Patient Unable To Answer (04/17/2024)  Utilities: Patient Unable To Answer (04/17/2024)  Financial Resource Strain: High Risk (10/06/2023)   Received from [facility]  Social Connections: Patient Unable To Answer (04/17/2024)  Tobacco Use: High Risk (04/16/2024)      Readmission Risk Interventions    04/28/2024   12:08 PM 12/30/2023   12:01 PM 03/06/2023    3:23 PM  Readmission Risk Prevention Plan  Transportation Screening Complete Complete Complete  PCP or Specialist Appt within 3-5 Days  Complete Complete  HRI or Home Care Consult   --  Social Work Consult for Recovery Care Planning/Counseling  Complete Complete  Palliative Care Screening  Not Applicable Not Applicable  Medication Review Oceanographer) Complete Complete Complete  PCP or Specialist appointment within 3-5 days of discharge Not Complete    PCP/Specialist Appt Not Complete comments Pt. to be followed up by SNF    Skilled Nursing Facility Complete

## 2024-04-28 NOTE — Plan of Care (Signed)

## 2024-04-28 NOTE — Plan of Care (Signed)
 PT D/C to [facility], BP 98/71 (BP Location: Left Arm)   Pulse 91   Temp 98 F (36.7 C)   Resp 18   Ht 5' 11 (1.803 m)   Wt 90.7 kg   SpO2 99%   BMI 27.89 kg/m  IV removed AVS reviewed follow up questions answered and included in D/C packed. No other needs voiced at this time. Albert Figueroa Louder 04/28/24 2:01 PM

## 2024-04-28 NOTE — Discharge Summary (Signed)
 Physician Discharge Summary   Albert Figueroa  male DOB: 05-24-1956  FMW:[id number]  PCP: Alla Amis, MD  Admit date: 04/16/2024 Discharge date: 04/28/2024  Admitted From: home Disposition:  SNF rehab CODE STATUS: DNR   Hospital Course:  For full details, please see H&P, progress notes, consult notes and ancillary notes.  Briefly,  Albert Figueroa is a 68 y.o. 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 He presented to [facility] ER on 07/18 with poor po intake and recurrent falls.  Pt previously hospitalized 04/17 to 04/21 with hypotension/sinus bradycardia attributed to AKI, and opiate overdose requiring narcan  infusion.     While holding in the ER pt c/o pain and received scheduled 20 mg oxycodone  ir.  He became altered, hypotensive, and bradycardic with pinpoint pupils.  He received narcan , atropine , and briefly started on levophed  gtt. Patient was admitted to stepdown unit with PCCM consulted.  BP's improved, but later required pressors again and PCCM was re-consulted and treated with Dopamine , stress dose IV steroids.  Cardiology was consulted.   Summary of significant hospital events: 07/18: Pt admitted to the stepdown unit with hypovolemic shock and acute toxic metabolic encephalopathy   07/19: Pt developed recurrent hypotension, sinus bradycardia, and worsening acute toxic metabolic encephalopathy requiring levophed  gtt.  Pt transferred to ICU and PCCM team consulted  04/18/24- patient off vasopressors and inotrope.  Thrombocytopenia possible due to anti epileptic therapy.  Follows commands but is with encephalopathy.  04/19/24: Again requiring Dopamine  to maintain BP, with attempt to wean, again became hypotensive and bradycardic.  Check cortisol, start stress dose steroids. Consulted Cardiology.  04/20/24: able to be weaned off Dopamine      7/25 --  weaned off steroids, only on midodrine , BP's stable 7/26 -- BP's stable, decreased midodrine    Acute toxic metabolic encephalopathy  Hx of Seizure disorder Hx of Chronic pain on chronic opioids Recurrent falls  Hx: Lewy body dementia, meningitis s/p VP shunt, and epilepsy  MRI Brain negative for acute intracranial process UDS and Ethanol is negative --home oxycodone  reduced from 20 mg q4h to 10 mg q4h PRN. --Continue home Depakote  ER.    Multifactorial Shock: hypovolemia and possible medication induced due to polypharmacy +/- Adrenal Insufficiency vs ? Autonomic Dysfunction (given his Lewy Body Dementia) Sinus bradycardia  Hx: HLD and HTN  Echocardiogram 04/17/24: LVEF 60-65%, grade I DD, RV systolic function is normal. Lactic acid has normalized HS Troponin is normal TSH was normal --Weaned off IV stress dose steroids as of evening 7/24 -- BP's remained stable.  Pt reported no problem being up and ambulating.   --discharged on midodrine  2.5 mg TID --home lasix and Lisinopril  d/c'ed.   Hypophosphatemia - --Monitored & replaced phos PRN   Emphysema/COPD without exacerbation  -cont daily Bronchodilators    Acute kidney injury, resolved CKD stage IIIb  Hypernatremia ~ RESOLVED --Cr 3.49 on presentation.  Improved to 1.95 prior to discharge.   Leukopenia, resolved UA without evidence of UTI, Chest X-ray without evidence of pneumonia, abdominal exam is benign, CT abdomen pelvis without acute process   Acute on Chronic Thrombocytopenia --plt 52 on presentation, nadir-ed at 26, improved to 95 prior to discharge.  Acute on Chronic Macrocytic Anemia ~ no signs of active bleeding Hbg slowly trended down over several days to 7.3 on 04/27/24, and received 1u pRBC with improvement to 8.2 prior to discharge.  Hx of Type II Diabetes Mellitus, not currently active --last A1c 4.2 about 3 months ago.  Not on hypoglycemics PTA.   Minimally displaced Acromial Fracture of Left  Shoulder -Orthopedics consulted ~ no treatment or restrictions at this time, continue with PT/OT   Unless noted above, medications under STOP list are ones pt was not taking PTA.  Discharge Diagnoses:  Principal Problem:   Hypovolemic shock (HCC) Active Problems:   Acute renal failure superimposed on chronic kidney disease (HCC)   Acute metabolic encephalopathy   Acute kidney injury superimposed on stage IIIb chronic kidney disease (HCC)   Pancytopenia (HCC)   Sinus bradycardia   Hypernatremia   Dyslipidemia   Chronic pain   Seizure disorder (HCC)   Lewy body dementia (HCC)   Right leg weakness   Cardiogenic shock (HCC)   Confusion   30 Day Unplanned Readmission Risk Score    Flowsheet Row ED to Hosp-Admission (Current) from 04/16/2024 in [facility]  30 Day Unplanned Readmission Risk Score (%) 50.23 Filed at 04/28/2024 0801    This score is the patient's risk of an unplanned readmission within 30 days of being discharged (0 -100%). The score is based on dignosis, age, lab data, medications, orders, and past utilization.   Low:  0-14.9   Medium: 15-21.9   High: 22-29.9   Extreme: 30 and above         Discharge Instructions:  Allergies as of 04/28/2024       Reactions   Aricept  [donepezil ] Other (See Comments)   SEVERE BRADYCARDIA WITH HEART BLOCK DO NOT RESTART AT DISCHARGE   Gabapentin  Other (See Comments)   Lethargic, Confused   Amlodipine    Other reaction(s): Other (see comments) Caused elevated blood pressure and headaches   Ozempic (0.25 Or 0.5 Mg-dose) [semaglutide(0.25 Or 0.5mg -dos)]    Lethargy.  Also dropped blood sugars too low        Medication List     STOP taking these medications    Farxiga  10 MG Tabs tablet Generic drug: dapagliflozin  propanediol   furosemide 20 MG tablet Commonly known as: LASIX   lactulose  10 GM/15ML solution Commonly known as: CHRONULAC    lisinopril  20 MG tablet Commonly  known as: ZESTRIL    Roflumilast  250 MCG Tabs   tiZANidine  4 MG capsule Commonly known as: ZANAFLEX    traZODone  50 MG tablet Commonly known as: DESYREL        TAKE these medications    acetaminophen  325 MG tablet Commonly known as: TYLENOL  Take 2 tablets (650 mg total) by mouth every 6 (six) hours as needed for mild pain (or Fever >/= 101).   albuterol  108 (90 Base) MCG/ACT inhaler Commonly known as: VENTOLIN  HFA Inhale into the lungs every 6 (six) hours as needed for wheezing or shortness of breath.   cyclobenzaprine  7.5 MG tablet Commonly known as: FEXMID  Take 1 tablet (7.5 mg total) by mouth 3 (three) times daily as needed for muscle spasms. Home med. What changed:  when to take this reasons to take this additional instructions   Dexilant 60 MG capsule Generic drug: dexlansoprazole Take 60 mg by mouth daily.   divalproex  500 MG 24 hr tablet Commonly known as: DEPAKOTE  ER Take 1,000 mg by mouth in the morning and at bedtime.   donepezil  10 MG disintegrating tablet Commonly known as: ARICEPT  ODT Take 10 mg by mouth at bedtime.   feeding supplement Liqd Take 237 mLs by mouth 3 (three) times daily between meals.  fenofibrate  160 MG tablet Take 160 mg by mouth daily.   ferrous sulfate  325 (65 FE) MG tablet Take 325 mg by mouth daily with breakfast.   hydrOXYzine  10 MG tablet Commonly known as: ATARAX  Take 10 mg by mouth 3 (three) times daily as needed.   lidocaine  5 % Commonly known as: LIDODERM  Place 1 patch onto the skin daily.   midodrine  2.5 MG tablet Commonly known as: PROAMATINE  Take 1 tablet (2.5 mg total) by mouth 3 (three) times daily with meals.   multivitamin with minerals Tabs tablet Take 1 tablet by mouth daily. Start taking on: April 29, 2024   naloxone  4 MG/0.1ML Liqd nasal spray kit Commonly known as: NARCAN  Place 1 spray into the nose once as needed for up to 1 dose. What changed:  when to take this reasons to take this    Oxycodone  HCl 20 MG Tabs Take 0.5 tablets (10 mg total) by mouth every 4 (four) hours as needed. Home med.  Reduced from 20 mg. What changed:  how much to take when to take this reasons to take this additional instructions   polyethylene glycol 17 g packet Commonly known as: MIRALAX  / GLYCOLAX  Take 17 g by mouth daily.   rosuvastatin  20 MG tablet Commonly known as: CRESTOR  Take 20 mg by mouth at bedtime.   Trelegy Ellipta 100-62.5-25 MCG/ACT Aepb Generic drug: Fluticasone -Umeclidin-Vilant Inhale into the lungs daily.         Contact information for follow-up providers     Alla Amis, MD Follow up.   Specialty: Family Medicine Contact information: [address] [facility] [place] [address] [phone]              Contact information for after-discharge care     Destination     [facility] [place], [place]. [place]   Service: Skilled Nursing Contact information: [address] [place]  [address] [phone]                     Allergies  Allergen Reactions   Aricept  [Donepezil ] Other (See Comments)    SEVERE BRADYCARDIA WITH HEART BLOCK DO NOT RESTART AT DISCHARGE   Gabapentin  Other (See Comments)    Lethargic, Confused   Amlodipine     Other reaction(s): Other (see comments) Caused elevated blood pressure and headaches   Ozempic (0.25 Or 0.5 Mg-Dose) [Semaglutide(0.25 Or 0.5mg -Dos)]     Lethargy.  Also dropped blood sugars too low     The results of significant diagnostics from this hospitalization (including imaging, microbiology, ancillary and laboratory) are listed below for reference.   Consultations:   Procedures/Studies: DG Shoulder Left Port Result Date: 04/19/2024 CLINICAL DATA:  [id number] Acromial process of scapula fracture [id number] EXAM: LEFT SHOULDER COMPARISON:  X-ray left shoulder 03/06/2024 FINDINGS: Subacute left acromial fracture with associated periosteal reaction. There is no  evidence of acute displaced fracture or dislocation. There is no evidence of arthropathy or other focal bone abnormality. Soft tissues are unremarkable. IMPRESSION: 1. Subacute left acromial fracture with associated periosteal reaction. 2.  No acute displaced fracture or dislocation. Electronically Signed   By: Morgane  Naveau M.D.   On: 04/19/2024 20:19   ECHOCARDIOGRAM COMPLETE Result Date: 04/18/2024    ECHOCARDIOGRAM REPORT   Patient Name:   ANATOLE APOLLO Forrester Date of Exam: 04/17/2024 Medical Rec #:  [id number]     Height:       71.0 in Accession #:    [id number]    Weight:  125.0 lb Date of Birth:  07-19-1956      BSA:          1.727 m Patient Age:    68 years      BP:           95/53 mmHg Patient Gender: M             HR:           75 bpm. Exam Location:  [facility] Procedure: 2D Echo, Cardiac Doppler and Color Doppler (Both Spectral and Color            Flow Doppler were utilized during procedure). Indications:     Abnormal ECG R94.31  History:         Patient has no prior history of Echocardiogram examinations.                  Risk Factors:Hypertension.  Sonographer:     Bari Roar Referring Phys:  [id number] LONELL KANDICE MOOSE Diagnosing Phys: Cara JONETTA Lovelace MD IMPRESSIONS  1. TDS.  2. Left ventricular ejection fraction, by estimation, is 60 to 65%. The left ventricle has normal function. The left ventricle has no regional wall motion abnormalities. Left ventricular diastolic parameters are consistent with Grade I diastolic dysfunction (impaired relaxation).  3. Right ventricular systolic function is normal. The right ventricular size is normal.  4. The mitral valve is normal in structure. Trivial mitral valve regurgitation.  5. The aortic valve is normal in structure. Aortic valve regurgitation is not visualized. Conclusion(s)/Recommendation(s): Poor windows for evaluation of left ventricular function by transthoracic echocardiography. Would recommend an alternative means of evaluation. FINDINGS  Left Ventricle:  Left ventricular ejection fraction, by estimation, is 60 to 65%. The left ventricle has normal function. The left ventricle has no regional wall motion abnormalities. Strain was performed and the global longitudinal strain is indeterminate. The left ventricular internal cavity size was normal in size. There is no left ventricular hypertrophy. Left ventricular diastolic parameters are consistent with Grade I diastolic dysfunction (impaired relaxation). Right Ventricle: The right ventricular size is normal. No increase in right ventricular wall thickness. Right ventricular systolic function is normal. Left Atrium: Left atrial size was normal in size. Right Atrium: Right atrial size was normal in size. Pericardium: There is no evidence of pericardial effusion. Mitral Valve: The mitral valve is normal in structure. Trivial mitral valve regurgitation. MV peak gradient, 10.6 mmHg. The mean mitral valve gradient is 3.0 mmHg. Tricuspid Valve: The tricuspid valve is normal in structure. Tricuspid valve regurgitation is trivial. Aortic Valve: The aortic valve is normal in structure. Aortic valve regurgitation is not visualized. Aortic valve mean gradient measures 5.0 mmHg. Aortic valve peak gradient measures 9.7 mmHg. Aortic valve area, by VTI measures 2.34 cm. Pulmonic Valve: The pulmonic valve was normal in structure. Pulmonic valve regurgitation is not visualized. Aorta: The ascending aorta was not well visualized. IAS/Shunts: No atrial level shunt detected by color flow Doppler. Additional Comments: TDS. 3D was performed not requiring image post processing on an independent workstation and was indeterminate.  LEFT VENTRICLE PLAX 2D LVIDd:         3.90 cm   Diastology LVIDs:         2.60 cm   LV e' medial:    9.36 cm/s LV PW:         0.80 cm   LV E/e' medial:  9.9 LV IVS:        1.10 cm   LV e' lateral:  14.00 cm/s LVOT diam:     1.70 cm   LV E/e' lateral: 6.6 LV SV:         61 LV SV Index:   35 LVOT Area:     2.27 cm   RIGHT VENTRICLE RV Basal diam:  2.20 cm RV Mid diam:    2.45 cm RV S prime:     18.50 cm/s TAPSE (M-mode): 1.8 cm LEFT ATRIUM           Index        RIGHT ATRIUM          Index LA diam:      3.40 cm 1.97 cm/m   RA Area:     8.05 cm LA Vol (A4C): 39.2 ml 22.70 ml/m  RA Volume:   11.60 ml 6.72 ml/m  AORTIC VALVE                     PULMONIC VALVE AV Area (Vmax):    2.17 cm      PV Vmax:        1.14 m/s AV Area (Vmean):   1.87 cm      PV Peak grad:   5.2 mmHg AV Area (VTI):     2.34 cm      RVOT Peak grad: 4 mmHg AV Vmax:           156.00 cm/s AV Vmean:          108.000 cm/s AV VTI:            0.260 m AV Peak Grad:      9.7 mmHg AV Mean Grad:      5.0 mmHg LVOT Vmax:         149.00 cm/s LVOT Vmean:        89.100 cm/s LVOT VTI:          0.268 m LVOT/AV VTI ratio: 1.03  AORTA Ao Root diam: 2.80 cm Ao Asc diam:  2.80 cm MITRAL VALVE MV Area (PHT): 1.74 cm     SHUNTS MV Area VTI:   1.40 cm     Systemic VTI:  0.27 m MV Peak grad:  10.6 mmHg    Systemic Diam: 1.70 cm MV Mean grad:  3.0 mmHg MV Vmax:       1.63 m/s MV Vmean:      72.2 cm/s MV Decel Time: 435 msec MV E velocity: 92.70 cm/s MV A velocity: 152.00 cm/s MV E/A ratio:  0.61 MV A Prime:    14.9 cm/s Dwayne D Callwood MD Electronically signed by Cara JONETTA Lovelace MD Signature Date/Time: 04/18/2024/9:20:44 AM    Final    MR BRAIN WO CONTRAST Result Date: 04/17/2024 CLINICAL DATA:  Acute neurologic deficit EXAM: MRI HEAD WITHOUT CONTRAST TECHNIQUE: Multiplanar, multiecho pulse sequences of the brain and surrounding structures were obtained without intravenous contrast. COMPARISON:  03/10/2023 FINDINGS: Brain: No acute infarct, mass effect or extra-axial collection. Unchanged chronic bilateral extra-axial collections compared to 04/17/2024 head CT, but increased since 03/10/2023 brain MRI. Chronic blood products at site of right frontal encephalomalacia. Right posterior approach shunt catheter terminating near the right foramen of Monro. Old left basal  ganglia small vessel infarct. The midline structures are normal. Vascular: Normal flow voids. Skull and upper cervical spine: Normal calvarium and skull base. Visualized upper cervical spine and soft tissues are normal. Sinuses/Orbits:No paranasal sinus fluid levels or advanced mucosal thickening. No mastoid or middle ear effusion. Normal orbits. IMPRESSION: 1. No acute  intracranial abnormality. 2. Unchanged chronic bilateral extra-axial collections compared to 04/17/2024 head CT, but increased since 03/10/2023 brain MRI. 3. Right posterior approach shunt catheter terminating near the right foramen of Monro. Electronically Signed   By: Franky Stanford M.D.   On: 04/17/2024 23:08   DG Chest Port 1 View Result Date: 04/17/2024 CLINICAL DATA:  Aspiration into airway. EXAM: PORTABLE CHEST 1 VIEW COMPARISON:  03/06/2024 and older studies. FINDINGS: Cardiac silhouette is normal in size. No mediastinal or hilar masses. Lung volumes are low. There is bronchovascular crowding the bases. Allowing for this, the lungs are clear. No convincing pleural effusion or pneumothorax. Stable right anterior chest wall ventriculoperitoneal shunt. Skeletal structures are grossly intact. IMPRESSION: No acute cardiopulmonary disease. Electronically Signed   By: Alm Parkins M.D.   On: 04/17/2024 12:03   CT ABDOMEN PELVIS WO CONTRAST Result Date: 04/17/2024 CLINICAL DATA:  Kidney failure, acute dehydrated. Patient states that he does feel weak and that he hasn't been able to eat much in quite awhile. Patient is alert and oriented X4, but he is ; hypotensive in triage. He denies fevers, nausea, or vomiting. Endorses chronic back pain and shoulder pain. He says he has fallen several times lately. He is having trouble telling me if he has injured anything, and says he can't really remember. He ; denies taking blood thinners. EXAM: CT ABDOMEN AND PELVIS WITHOUT CONTRAST TECHNIQUE: Multidetector CT imaging of the abdomen and pelvis was  performed following the standard protocol without IV contrast. RADIATION DOSE REDUCTION: This exam was performed according to the departmental dose-optimization program which includes automated exposure control, adjustment of the mA and/or kV according to patient size and/or use of iterative reconstruction technique. COMPARISON:  CT abdomen pelvis 12/30/2023 FINDINGS: Lower chest: Aortic valve leaflet calcification. Four-vessel coronary calcification. Hepatobiliary: No focal liver abnormality. No gallstones, gallbladder wall thickening, or pericholecystic fluid. No biliary dilatation. Pancreas: Diffusely atrophic. No focal lesion. Otherwise normal pancreatic contour. No surrounding inflammatory changes. No main pancreatic ductal dilatation. Spleen: Normal in size without focal abnormality. Adrenals/Urinary Tract: No adrenal nodule bilaterally. No nephrolithiasis and no hydronephrosis. Fluid density lesions of the kidneys likely represent simple renal cysts. Simple renal cysts, in the absence of clinically indicated signs/symptoms, require no independent follow-up. No ureterolithiasis or hydroureter. The urinary bladder is unremarkable. Stomach/Bowel: Stomach is within normal limits. No evidence of bowel wall thickening or dilatation. Status post appendectomy. Vascular/Lymphatic: No abdominal aorta or iliac aneurysm. Severe atherosclerotic plaque of the aorta and its branches. No abdominal, pelvic, or inguinal lymphadenopathy. Reproductive: Prostate is unremarkable. Other: VP shunt terminates within the right lower abdomen. Trace simple free fluid ascites. No intraperitoneal free gas. No organized fluid collection. Musculoskeletal: Fat containing umbilical hernia. Densely sclerotic lesion of the proximal right femur and left iliac bone likely bone islands. No suspicious lytic or blastic osseous lesions. No acute displaced fracture. Multilevel degenerative changes of the spine. IMPRESSION: 1. No acute intra-abdominal  or intrapelvic abnormality with limited evaluation on this noncontrast study. 2. Trace simple free fluid ascites likely due to VP shunt. 3. Aortic Atherosclerosis (ICD10-I70.0) -severe, including four-vessel coronary calcification and aortic valve leaflet calcification-correlate for aortic stenosis. Electronically Signed   By: Morgane  Naveau M.D.   On: 04/17/2024 00:44   CT Head Wo Contrast Result Date: 04/17/2024 EXAM: CT HEAD AND CERVICAL SPINE 04/17/2024 12:19:25 AM TECHNIQUE: CT of the head and cervical spine was performed without the administration of intravenous contrast. Multiplanar reformatted images are provided for review. Automated exposure control, iterative  reconstruction, and/or weight based adjustment of the mA/kV was utilized to reduce the radiation dose to as low as reasonably achievable. COMPARISON: None available. CLINICAL HISTORY: Head trauma, minor (Age >= 65y). Per ed notes; Patient arrives via EMS after family called this evening stating that they think the patient may be dehydrated. Patient states that he does feel weak and that he hasn't been able to eat much in quite awhile. Patient is alert and oriented X4, but he is hypotensive in triage. He denies fevers, nausea, or vomiting. Endorses chronic back pain and shoulder pain. He says he has fallen several times lately. He is having trouble telling me if he has injured anything, and says he can't really remember. He denies taking blood thinners. FINDINGS: CT HEAD BRAIN AND VENTRICLES: No acute intracranial hemorrhage. No mass effect or midline shift. Gray-white differentiation is maintained. No hydrocephalus. Chronic encephalomalacia of the right frontal lobes unchanged. Bilateral low-density extra-axial collections are unchanged and likely hygromas. ORBITS: No acute abnormality. SINUSES AND MASTOIDS: No acute abnormality. SOFT TISSUES AND SKULL: No acute skull fracture. No acute soft tissue abnormality. Right parietal approach catheter  with tip near the right foramina. VASCULATURE: Mild calcific atherosclerosis of the internal carotid arteries at the skull base and mild bilateral carotid bifurcation atherosclerosis. CT CERVICAL SPINE BONES AND ALIGNMENT: No acute fracture or traumatic malalignment. Mild vertebral body height loss at the C4-6 levels. DEGENERATIVE CHANGES: Right C3-4 facet hypertrophy and fusion. SOFT TISSUES: No prevertebral soft tissue swelling. IMPRESSION: 1. No acute intracranial abnormality. 2. No acute fracture or traumatic malalignment of the cervical spine. Electronically signed by: Franky Stanford MD 04/17/2024 12:24 AM EDT RP Workstation: [id number]   CT Cervical Spine Wo Contrast Result Date: 04/17/2024 EXAM: CT HEAD AND CERVICAL SPINE 04/17/2024 12:19:25 AM TECHNIQUE: CT of the head and cervical spine was performed without the administration of intravenous contrast. Multiplanar reformatted images are provided for review. Automated exposure control, iterative reconstruction, and/or weight based adjustment of the mA/kV was utilized to reduce the radiation dose to as low as reasonably achievable. COMPARISON: None available. CLINICAL HISTORY: Head trauma, minor (Age >= 65y). Per ed notes; Patient arrives via EMS after family called this evening stating that they think the patient may be dehydrated. Patient states that he does feel weak and that he hasn't been able to eat much in quite awhile. Patient is alert and oriented X4, but he is hypotensive in triage. He denies fevers, nausea, or vomiting. Endorses chronic back pain and shoulder pain. He says he has fallen several times lately. He is having trouble telling me if he has injured anything, and says he can't really remember. He denies taking blood thinners. FINDINGS: CT HEAD BRAIN AND VENTRICLES: No acute intracranial hemorrhage. No mass effect or midline shift. Gray-white differentiation is maintained. No hydrocephalus. Chronic encephalomalacia of the right frontal lobes  unchanged. Bilateral low-density extra-axial collections are unchanged and likely hygromas. ORBITS: No acute abnormality. SINUSES AND MASTOIDS: No acute abnormality. SOFT TISSUES AND SKULL: No acute skull fracture. No acute soft tissue abnormality. Right parietal approach catheter with tip near the right foramina. VASCULATURE: Mild calcific atherosclerosis of the internal carotid arteries at the skull base and mild bilateral carotid bifurcation atherosclerosis. CT CERVICAL SPINE BONES AND ALIGNMENT: No acute fracture or traumatic malalignment. Mild vertebral body height loss at the C4-6 levels. DEGENERATIVE CHANGES: Right C3-4 facet hypertrophy and fusion. SOFT TISSUES: No prevertebral soft tissue swelling. IMPRESSION: 1. No acute intracranial abnormality. 2. No acute fracture or traumatic malalignment of  the cervical spine. Electronically signed by: Franky Stanford MD 04/17/2024 12:24 AM EDT RP Workstation: [id number]      Labs: BNP (last 3 results) Recent Labs    04/18/24 0433  BNP 420.4*   Basic Metabolic Panel: Recent Labs  Lab 04/22/24 0402 04/22/24 0404 04/22/24 0404 04/23/24 0539 04/24/24 0316 04/25/24 0445 04/26/24 0326 04/27/24 0339  NA  --  141  --  144 139 139  --  141  K  --  4.2  --  3.7 4.4 4.1  --  4.5  CL  --  107  --  107 108 106  --  111  CO2  --  26  --  27 28 27   --  25  GLUCOSE  --  121*  --  132* 111* 120*  --  92  BUN  --  53*  --  60* 62* 55*  --  50*  CREATININE  --  1.83*  --  2.01* 2.15* 2.10*  --  1.95*  CALCIUM   --  8.7*  --  8.4* 8.6* 8.4*  --  8.3*  MG 2.7*  --   --   --   --   --   --   --   PHOS  --  3.6   < > 2.4* 2.1* 2.2* 2.2* 2.9   < > = values in this interval not displayed.   Liver Function Tests: Recent Labs  Lab 04/22/24 0404 04/23/24 0539 04/24/24 0316  ALBUMIN 2.4* 2.2* 2.3*   No results for input(s): LIPASE, AMYLASE in the last 168 hours. No results for input(s): AMMONIA in the last 168 hours. CBC: Recent Labs  Lab  04/23/24 0539 04/24/24 0316 04/26/24 0326 04/27/24 0339 04/28/24 0400  WBC 3.1* 3.7* 4.5 3.9* 4.3  HGB 8.5* 7.9* 7.4* 7.3* 8.2*  HCT 26.9* 24.6* 22.6* 23.1* 24.7*  MCV 107.2* 104.7* 104.1* 106.5* 98.4  PLT 32* 46* 68* 73* 95*   Cardiac Enzymes: No results for input(s): CKTOTAL, CKMB, CKMBINDEX, TROPONINI in the last 168 hours. BNP: Invalid input(s): POCBNP CBG: Recent Labs  Lab 04/27/24 0751 04/27/24 1154 04/27/24 1701 04/27/24 2105 04/28/24 0833  GLUCAP 69* 101* 102* 112* 92   D-Dimer No results for input(s): DDIMER in the last 72 hours. Hgb A1c No results for input(s): HGBA1C in the last 72 hours. Lipid Profile No results for input(s): CHOL, HDL, LDLCALC, TRIG, CHOLHDL, LDLDIRECT in the last 72 hours. Thyroid  function studies No results for input(s): TSH, T4TOTAL, T3FREE, THYROIDAB in the last 72 hours.  Invalid input(s): FREET3 Anemia work up Recent Labs    04/26/24 0326  FOLATE 9.7  FERRITIN 102  TIBC 298  IRON 58  RETICCTPCT 5.7*   Urinalysis    Component Value Date/Time   COLORURINE YELLOW (A) 04/17/2024 0704   APPEARANCEUR CLEAR (A) 04/17/2024 0704   LABSPEC 1.010 04/17/2024 0704   PHURINE 5.0 04/17/2024 0704   GLUCOSEU 50 (A) 04/17/2024 0704   HGBUR NEGATIVE 04/17/2024 0704   BILIRUBINUR NEGATIVE 04/17/2024 0704   KETONESUR NEGATIVE 04/17/2024 0704   PROTEINUR NEGATIVE 04/17/2024 0704   NITRITE NEGATIVE 04/17/2024 0704   LEUKOCYTESUR NEGATIVE 04/17/2024 0704   Sepsis Labs Recent Labs  Lab 04/24/24 0316 04/26/24 0326 04/27/24 0339 04/28/24 0400  WBC 3.7* 4.5 3.9* 4.3   Microbiology No results found for this or any previous visit (from the past 240 hours).   Total time spend on discharging this patient, including the last patient exam, discussing the hospital stay, instructions for  ongoing care as it relates to all pertinent caregivers, as well as preparing the medical discharge records, prescriptions,  and/or referrals as applicable, is 35 minutes.    Ellouise Haber, MD  [facility] 04/28/2024, 10:57 AM

## 2024-04-29 LAB — TYPE AND SCREEN
ABO/RH(D): A POS
Antibody Screen: POSITIVE
Unit division: 0
Unit division: 0
Unit division: 0
Unit division: 0

## 2024-04-29 LAB — BPAM RBC
Blood Product Expiration Date: 202508312359
Blood Product Expiration Date: 202508062359
Blood Product Expiration Date: 202508292359
Blood Product Unit Number: 202508292359
ISSUE DATE / TIME: 202507291414
ISSUE DATE / TIME: 202507301828
Unit Type and Rh: 202508292359
Unit Type and Rh: 202508312359
Unit Type and Rh: 600
Unit Type and Rh: 6200
Unit Type and Rh: 6200
Unit Type and Rh: 6200

## 2024-05-07 ENCOUNTER — Emergency Department

## 2024-05-07 ENCOUNTER — Inpatient Hospital Stay
Admission: EM | Admit: 2024-05-07 | Discharge: 2024-05-15 | DRG: 871 | Disposition: A | Discharge status: Home Health | Attending: Internal Medicine | Admitting: Internal Medicine

## 2024-05-07 ENCOUNTER — Other Ambulatory Visit: Payer: Self-pay

## 2024-05-07 ENCOUNTER — Inpatient Hospital Stay

## 2024-05-07 DIAGNOSIS — J9601 Acute respiratory failure with hypoxia: Secondary | ICD-10-CM | POA: Diagnosis present

## 2024-05-07 DIAGNOSIS — J439 Emphysema, unspecified: Secondary | ICD-10-CM | POA: Diagnosis present

## 2024-05-07 DIAGNOSIS — G40909 Epilepsy, unspecified, not intractable, without status epilepticus: Secondary | ICD-10-CM | POA: Diagnosis present

## 2024-05-07 DIAGNOSIS — E440 Moderate protein-calorie malnutrition: Secondary | ICD-10-CM | POA: Diagnosis present

## 2024-05-07 DIAGNOSIS — E876 Hypokalemia: Secondary | ICD-10-CM | POA: Diagnosis present

## 2024-05-07 DIAGNOSIS — R296 Repeated falls: Secondary | ICD-10-CM | POA: Diagnosis present

## 2024-05-07 DIAGNOSIS — N183 Chronic kidney disease, stage 3 unspecified: Secondary | ICD-10-CM | POA: Diagnosis present

## 2024-05-07 DIAGNOSIS — Z6827 Body mass index (BMI) 27.0-27.9, adult: Secondary | ICD-10-CM

## 2024-05-07 DIAGNOSIS — G928 Other toxic encephalopathy: Secondary | ICD-10-CM | POA: Diagnosis present

## 2024-05-07 DIAGNOSIS — Z515 Encounter for palliative care: Secondary | ICD-10-CM | POA: Diagnosis not present

## 2024-05-07 DIAGNOSIS — J441 Chronic obstructive pulmonary disease with (acute) exacerbation: Secondary | ICD-10-CM | POA: Diagnosis present

## 2024-05-07 DIAGNOSIS — N179 Acute kidney failure, unspecified: Secondary | ICD-10-CM | POA: Diagnosis present

## 2024-05-07 DIAGNOSIS — R627 Adult failure to thrive: Secondary | ICD-10-CM | POA: Diagnosis not present

## 2024-05-07 DIAGNOSIS — Z79899 Other long term (current) drug therapy: Secondary | ICD-10-CM

## 2024-05-07 DIAGNOSIS — D649 Anemia, unspecified: Secondary | ICD-10-CM | POA: Diagnosis present

## 2024-05-07 DIAGNOSIS — R6521 Severe sepsis with septic shock: Secondary | ICD-10-CM | POA: Diagnosis present

## 2024-05-07 DIAGNOSIS — G3183 Dementia with Lewy bodies: Secondary | ICD-10-CM | POA: Diagnosis present

## 2024-05-07 DIAGNOSIS — Z833 Family history of diabetes mellitus: Secondary | ICD-10-CM

## 2024-05-07 DIAGNOSIS — E8770 Fluid overload, unspecified: Secondary | ICD-10-CM | POA: Diagnosis present

## 2024-05-07 DIAGNOSIS — R6520 Severe sepsis without septic shock: Secondary | ICD-10-CM

## 2024-05-07 DIAGNOSIS — I129 Hypertensive chronic kidney disease with stage 1 through stage 4 chronic kidney disease, or unspecified chronic kidney disease: Secondary | ICD-10-CM | POA: Diagnosis present

## 2024-05-07 DIAGNOSIS — J189 Pneumonia, unspecified organism: Secondary | ICD-10-CM | POA: Diagnosis present

## 2024-05-07 DIAGNOSIS — F17210 Nicotine dependence, cigarettes, uncomplicated: Secondary | ICD-10-CM | POA: Diagnosis present

## 2024-05-07 DIAGNOSIS — E870 Hyperosmolality and hypernatremia: Secondary | ICD-10-CM | POA: Diagnosis present

## 2024-05-07 DIAGNOSIS — Z1152 Encounter for screening for COVID-19: Secondary | ICD-10-CM | POA: Diagnosis not present

## 2024-05-07 DIAGNOSIS — Z66 Do not resuscitate: Secondary | ICD-10-CM | POA: Diagnosis present

## 2024-05-07 DIAGNOSIS — R1310 Dysphagia, unspecified: Secondary | ICD-10-CM | POA: Diagnosis not present

## 2024-05-07 DIAGNOSIS — J440 Chronic obstructive pulmonary disease with acute lower respiratory infection: Secondary | ICD-10-CM | POA: Diagnosis present

## 2024-05-07 DIAGNOSIS — J9602 Acute respiratory failure with hypercapnia: Secondary | ICD-10-CM | POA: Diagnosis present

## 2024-05-07 DIAGNOSIS — N189 Chronic kidney disease, unspecified: Secondary | ICD-10-CM | POA: Diagnosis not present

## 2024-05-07 DIAGNOSIS — D696 Thrombocytopenia, unspecified: Secondary | ICD-10-CM | POA: Diagnosis present

## 2024-05-07 DIAGNOSIS — F02B0 Dementia in other diseases classified elsewhere, moderate, without behavioral disturbance, psychotic disturbance, mood disturbance, and anxiety: Secondary | ICD-10-CM | POA: Diagnosis not present

## 2024-05-07 DIAGNOSIS — E1122 Type 2 diabetes mellitus with diabetic chronic kidney disease: Secondary | ICD-10-CM | POA: Diagnosis present

## 2024-05-07 DIAGNOSIS — Z8661 Personal history of infections of the central nervous system: Secondary | ICD-10-CM

## 2024-05-07 DIAGNOSIS — E119 Type 2 diabetes mellitus without complications: Secondary | ICD-10-CM | POA: Diagnosis not present

## 2024-05-07 DIAGNOSIS — A419 Sepsis, unspecified organism: Principal | ICD-10-CM | POA: Diagnosis present

## 2024-05-07 DIAGNOSIS — E1129 Type 2 diabetes mellitus with other diabetic kidney complication: Secondary | ICD-10-CM | POA: Diagnosis present

## 2024-05-07 DIAGNOSIS — Z7189 Other specified counseling: Secondary | ICD-10-CM | POA: Diagnosis not present

## 2024-05-07 DIAGNOSIS — Z982 Presence of cerebrospinal fluid drainage device: Secondary | ICD-10-CM

## 2024-05-07 DIAGNOSIS — R001 Bradycardia, unspecified: Secondary | ICD-10-CM | POA: Diagnosis not present

## 2024-05-07 DIAGNOSIS — J449 Chronic obstructive pulmonary disease, unspecified: Secondary | ICD-10-CM | POA: Diagnosis not present

## 2024-05-07 DIAGNOSIS — E785 Hyperlipidemia, unspecified: Secondary | ICD-10-CM | POA: Diagnosis present

## 2024-05-07 DIAGNOSIS — F0280 Dementia in other diseases classified elsewhere without behavioral disturbance: Secondary | ICD-10-CM | POA: Diagnosis present

## 2024-05-07 DIAGNOSIS — Z8249 Family history of ischemic heart disease and other diseases of the circulatory system: Secondary | ICD-10-CM

## 2024-05-07 DIAGNOSIS — Z720 Tobacco use: Secondary | ICD-10-CM | POA: Diagnosis present

## 2024-05-07 DIAGNOSIS — R339 Retention of urine, unspecified: Secondary | ICD-10-CM | POA: Diagnosis present

## 2024-05-07 DIAGNOSIS — G894 Chronic pain syndrome: Secondary | ICD-10-CM | POA: Diagnosis present

## 2024-05-07 DIAGNOSIS — Z7984 Long term (current) use of oral hypoglycemic drugs: Secondary | ICD-10-CM

## 2024-05-07 LAB — PROTIME-INR
INR: 1.3 — ABNORMAL HIGH (ref 0.8–1.2)
Prothrombin Time: 17.2 s — ABNORMAL HIGH (ref 11.4–15.2)

## 2024-05-07 LAB — RESP PANEL BY RT-PCR (RSV, FLU A&B, COVID)  RVPGX2
Influenza A by PCR: NEGATIVE
Influenza B by PCR: NEGATIVE
Resp Syncytial Virus by PCR: NEGATIVE
SARS Coronavirus 2 by RT PCR: NEGATIVE

## 2024-05-07 LAB — COMPREHENSIVE METABOLIC PANEL WITH GFR
ALT: 10 U/L (ref 0–44)
AST: 24 U/L (ref 15–41)
Albumin: 2.5 g/dL — ABNORMAL LOW (ref 3.5–5.0)
Alkaline Phosphatase: 63 U/L (ref 38–126)
Anion gap: 14 (ref 5–15)
BUN: 40 mg/dL — ABNORMAL HIGH (ref 8–23)
CO2: 24 mmol/L (ref 22–32)
Calcium: 8.6 mg/dL — ABNORMAL LOW (ref 8.9–10.3)
Chloride: 105 mmol/L (ref 98–111)
Creatinine, Ser: 2.71 mg/dL — ABNORMAL HIGH (ref 0.61–1.24)
GFR, Estimated: 25 mL/min — ABNORMAL LOW (ref >60)
Glucose, Bld: 85 mg/dL (ref 70–99)
Potassium: 2.8 mmol/L — ABNORMAL LOW (ref 3.5–5.1)
Sodium: 143 mmol/L (ref 135–145)
Total Bilirubin: 1.3 mg/dL — ABNORMAL HIGH (ref 0.0–1.2)
Total Protein: 7.4 g/dL (ref 6.5–8.1)

## 2024-05-07 LAB — CBC WITH DIFFERENTIAL/PLATELET
Abs Granulocyte: 3.3 K/uL (ref 1.5–6.5)
Abs Immature Granulocytes: 0.04 K/uL (ref 0.00–0.07)
Basophils Absolute: 0 K/uL (ref 0.0–0.1)
Basophils Relative: 1 %
Eosinophils Absolute: 0 K/uL (ref 0.0–0.5)
Eosinophils Relative: 0 %
HCT: 32.9 % — ABNORMAL LOW (ref 39.0–52.0)
Hemoglobin: 10.4 g/dL — ABNORMAL LOW (ref 13.0–17.0)
Immature Granulocytes: 1 %
Lymphocytes Relative: 11 %
Lymphs Abs: 0.5 K/uL — ABNORMAL LOW (ref 0.7–4.0)
MCH: 32.4 pg (ref 26.0–34.0)
MCHC: 31.6 g/dL (ref 30.0–36.0)
MCV: 102.5 fL — ABNORMAL HIGH (ref 80.0–100.0)
Monocytes Absolute: 0.5 K/uL (ref 0.1–1.0)
Monocytes Relative: 13 %
Neutro Abs: 3.3 K/uL (ref 1.7–7.7)
Neutrophils Relative %: 74 %
Platelets: 76 K/uL — ABNORMAL LOW (ref 150–400)
RBC: 3.21 MIL/uL — ABNORMAL LOW (ref 4.22–5.81)
RDW: 20.6 % — ABNORMAL HIGH (ref 11.5–15.5)
WBC: 4.3 K/uL (ref 4.0–10.5)
nRBC: 0 % (ref 0.0–0.2)

## 2024-05-07 LAB — BRAIN NATRIURETIC PEPTIDE: B Natriuretic Peptide: 691.2 pg/mL — ABNORMAL HIGH (ref 0.0–100.0)

## 2024-05-07 LAB — CORTISOL: Cortisol, Plasma: 27.8 ug/dL

## 2024-05-07 LAB — URINALYSIS, W/ REFLEX TO CULTURE (INFECTION SUSPECTED)
Bilirubin Urine: NEGATIVE
Glucose, UA: 150 mg/dL — AB
Hgb urine dipstick: NEGATIVE
Ketones, ur: NEGATIVE mg/dL
Leukocytes,Ua: NEGATIVE
Nitrite: NEGATIVE
Protein, ur: NEGATIVE mg/dL
RBC / HPF: 0 RBC/hpf (ref 0–5)
Specific Gravity, Urine: 1.012 (ref 1.005–1.030)
Squamous Epithelial / HPF: 0 /HPF (ref 0–5)
pH: 5 (ref 5.0–8.0)

## 2024-05-07 LAB — LACTIC ACID, PLASMA
Lactic Acid, Venous: 2.6 mmol/L (ref 0.5–1.9)
Lactic Acid, Venous: 2.2 mmol/L (ref 0.5–1.9)

## 2024-05-07 LAB — STREP PNEUMONIAE URINARY ANTIGEN: Strep Pneumo Urinary Antigen: NEGATIVE

## 2024-05-07 LAB — TROPONIN I (HIGH SENSITIVITY)
Troponin I (High Sensitivity): 9 ng/L (ref <18)
Troponin I (High Sensitivity): 15 ng/L (ref <18)

## 2024-05-07 LAB — BLOOD GAS, VENOUS: Patient temperature: 37

## 2024-05-07 LAB — MRSA NEXT GEN BY PCR, NASAL: MRSA by PCR Next Gen: NOT DETECTED

## 2024-05-07 LAB — MAGNESIUM: Magnesium: 1.7 mg/dL (ref 1.7–2.4)

## 2024-05-07 LAB — LIPASE, BLOOD: Lipase: 22 U/L (ref 11–51)

## 2024-05-07 LAB — PROCALCITONIN: Procalcitonin: 16.12 ng/mL

## 2024-05-07 LAB — GLUCOSE, CAPILLARY: Glucose-Capillary: 83 mg/dL (ref 70–99)

## 2024-05-07 MED ORDER — VASOPRESSIN 20 UNITS/100 ML INFUSION FOR SHOCK
0.0400 [IU]/min | INTRAVENOUS | Status: DC
  Administered 2024-05-07 – 2024-05-08 (×2): 0.04 [IU]/min via INTRAVENOUS
  Filled 2024-05-07 (×3): qty 100

## 2024-05-07 MED ORDER — VANCOMYCIN HCL 1750 MG/350ML IV SOLN
1750.0000 mg | Freq: Once | INTRAVENOUS | Status: AC
  Administered 2024-05-07: 1750 mg via INTRAVENOUS
  Filled 2024-05-07: qty 350

## 2024-05-07 MED ORDER — LACTATED RINGERS IV SOLN
150.0000 mL/h | INTRAVENOUS | Status: AC
  Administered 2024-05-07: 150 mL/h via INTRAVENOUS

## 2024-05-07 MED ORDER — SENNOSIDES-DOCUSATE SODIUM 8.6-50 MG PO TABS: 1.0000 | ORAL_TABLET | Freq: Every evening | ORAL | Status: DC | PRN

## 2024-05-07 MED ORDER — ONDANSETRON HCL 4 MG PO TABS
4.0000 mg | ORAL_TABLET | Freq: Four times a day (QID) | ORAL | Status: DC | PRN
Start: 2024-05-07 — End: 2024-05-15

## 2024-05-07 MED ORDER — FERROUS SULFATE 325 (65 FE) MG PO TABS
325.0000 mg | ORAL_TABLET | Freq: Every day | ORAL | Status: DC
  Administered 2024-05-09 – 2024-05-10 (×3): 325 mg via ORAL
  Filled 2024-05-07 (×2): qty 1

## 2024-05-07 MED ORDER — NOREPINEPHRINE 4 MG/250ML-% IV SOLN: 0.0000 ug/min | INTRAVENOUS | Status: DC

## 2024-05-07 MED ORDER — POTASSIUM CHLORIDE 10 MEQ/100ML IV SOLN
10.0000 meq | INTRAVENOUS | Status: AC
  Administered 2024-05-08 (×2): 10 meq via INTRAVENOUS
  Filled 2024-05-07 (×2): qty 100

## 2024-05-07 MED ORDER — MIDODRINE HCL 5 MG PO TABS
2.5000 mg | ORAL_TABLET | Freq: Three times a day (TID) | ORAL | Status: DC
  Administered 2024-05-09 – 2024-05-10 (×4): 2.5 mg via ORAL
  Filled 2024-05-07 (×3): qty 1

## 2024-05-07 MED ORDER — PIPERACILLIN-TAZOBACTAM 3.375 G IVPB
3.3750 g | Freq: Once | INTRAVENOUS | Status: DC
  Administered 2024-05-07: 3.375 g via INTRAVENOUS
  Filled 2024-05-07: qty 50

## 2024-05-07 MED ORDER — DIVALPROEX SODIUM ER 500 MG PO TB24
1000.0000 mg | ORAL_TABLET | Freq: Two times a day (BID) | ORAL | Status: DC
  Filled 2024-05-07 (×2): qty 2

## 2024-05-07 MED ORDER — PANTOPRAZOLE SODIUM 40 MG PO TBEC
40.0000 mg | DELAYED_RELEASE_TABLET | Freq: Every day | ORAL | Status: DC
  Administered 2024-05-09 – 2024-05-10 (×3): 40 mg via ORAL
  Filled 2024-05-07 (×2): qty 1

## 2024-05-07 MED ORDER — SODIUM CHLORIDE 0.9 % IV SOLN
500.0000 mg | INTRAVENOUS | Status: DC
  Administered 2024-05-08 – 2024-05-09 (×2): 500 mg via INTRAVENOUS
  Filled 2024-05-07 (×3): qty 5

## 2024-05-07 MED ORDER — ONDANSETRON HCL 4 MG/2ML IJ SOLN: 4.0000 mg | Freq: Four times a day (QID) | INTRAMUSCULAR | Status: DC | PRN

## 2024-05-07 MED ORDER — HYDROCORTISONE SOD SUC (PF) 100 MG IJ SOLR
100.0000 mg | Freq: Three times a day (TID) | INTRAMUSCULAR | Status: DC
  Administered 2024-05-07: 100 mg via INTRAVENOUS
  Filled 2024-05-07: qty 2

## 2024-05-07 MED ORDER — ROSUVASTATIN CALCIUM 10 MG PO TABS
20.0000 mg | ORAL_TABLET | Freq: Every day | ORAL | Status: DC
  Administered 2024-05-09 – 2024-05-10 (×3): 20 mg via ORAL
  Filled 2024-05-07 (×2): qty 2

## 2024-05-07 MED ORDER — SODIUM CHLORIDE 0.9 % IV BOLUS
500.0000 mL | Freq: Once | INTRAVENOUS | Status: AC
  Administered 2024-05-07: 500 mL via INTRAVENOUS

## 2024-05-07 MED ORDER — DONEPEZIL HCL 5 MG PO TABS: 10.0000 mg | ORAL_TABLET | Freq: Every day | ORAL | Status: DC

## 2024-05-07 MED ORDER — SODIUM CHLORIDE 0.9 % IV SOLN: 250.0000 mL | INTRAVENOUS | Status: AC

## 2024-05-07 MED ORDER — HYDROXYZINE HCL 10 MG PO TABS
10.0000 mg | ORAL_TABLET | Freq: Three times a day (TID) | ORAL | Status: DC | PRN
  Administered 2024-05-09: 10 mg via ORAL
  Filled 2024-05-07 (×2): qty 1

## 2024-05-07 MED ORDER — ACETAMINOPHEN 500 MG PO TABS: 1000.0000 mg | ORAL_TABLET | Freq: Once | ORAL | Status: DC

## 2024-05-07 MED ORDER — VANCOMYCIN VARIABLE DOSE PER UNSTABLE RENAL FUNCTION (PHARMACIST DOSING): Status: DC

## 2024-05-07 MED ORDER — DIAZEPAM 5 MG/ML IJ SOLN
5.0000 mg | INTRAMUSCULAR | Status: DC | PRN
  Administered 2024-05-09: 5 mg via INTRAVENOUS
  Filled 2024-05-07: qty 2

## 2024-05-07 MED ORDER — ACETAMINOPHEN 650 MG RE SUPP: 650.0000 mg | Freq: Four times a day (QID) | RECTAL | Status: DC | PRN

## 2024-05-07 MED ORDER — SODIUM CHLORIDE 0.9 % IV BOLUS
1000.0000 mL | Freq: Once | INTRAVENOUS | Status: AC
  Administered 2024-05-07: 1000 mL via INTRAVENOUS

## 2024-05-07 MED ORDER — POTASSIUM CHLORIDE 20 MEQ PO PACK: 40.0000 meq | PACK | Freq: Two times a day (BID) | ORAL | Status: DC

## 2024-05-07 MED ORDER — BUDESON-GLYCOPYRROL-FORMOTEROL 160-9-4.8 MCG/ACT IN AERO
2.0000 | INHALATION_SPRAY | Freq: Two times a day (BID) | RESPIRATORY_TRACT | Status: DC
  Administered 2024-05-09 – 2024-05-15 (×16): 2 via RESPIRATORY_TRACT
  Filled 2024-05-07 (×2): qty 5.9

## 2024-05-07 MED ORDER — POTASSIUM CHLORIDE CRYS ER 20 MEQ PO TBCR: 40.0000 meq | EXTENDED_RELEASE_TABLET | Freq: Once | ORAL | Status: DC

## 2024-05-07 MED ORDER — ENSURE PLUS HIGH PROTEIN PO LIQD: 237.0000 mL | Freq: Three times a day (TID) | ORAL | Status: DC

## 2024-05-07 MED ORDER — POTASSIUM CHLORIDE 10 MEQ/100ML IV SOLN
10.0000 meq | Freq: Once | INTRAVENOUS | Status: DC
  Filled 2024-05-07: qty 100

## 2024-05-07 MED ORDER — ACETAMINOPHEN 325 MG PO TABS: 650.0000 mg | ORAL_TABLET | Freq: Four times a day (QID) | ORAL | Status: DC | PRN

## 2024-05-07 MED ORDER — NICOTINE 21 MG/24HR TD PT24: 21.0000 mg | MEDICATED_PATCH | Freq: Every day | TRANSDERMAL | Status: AC | PRN

## 2024-05-07 MED ORDER — NOREPINEPHRINE 4 MG/250ML-% IV SOLN
INTRAVENOUS | Status: AC
  Administered 2024-05-07: 4 ug/min via INTRAVENOUS
  Filled 2024-05-07: qty 250

## 2024-05-07 MED ORDER — POTASSIUM CHLORIDE 10 MEQ/100ML IV SOLN
10.0000 meq | INTRAVENOUS | Status: AC
  Administered 2024-05-07: 10 meq via INTRAVENOUS

## 2024-05-07 MED ORDER — POTASSIUM CHLORIDE 10 MEQ/100ML IV SOLN
10.0000 meq | INTRAVENOUS | Status: AC
  Administered 2024-05-07 – 2024-05-08 (×3): 10 meq via INTRAVENOUS
  Filled 2024-05-07 (×3): qty 100

## 2024-05-07 MED ORDER — SODIUM CHLORIDE 0.9 % IV SOLN: 2.0000 g | INTRAVENOUS | Status: DC

## 2024-05-07 MED ORDER — INSULIN ASPART 100 UNIT/ML IJ SOLN: 0.0000 [IU] | Freq: Every day | INTRAMUSCULAR | Status: DC

## 2024-05-07 MED ORDER — INSULIN ASPART 100 UNIT/ML IJ SOLN
0.0000 [IU] | Freq: Three times a day (TID) | INTRAMUSCULAR | Status: DC
  Administered 2024-05-08 (×2): 1 [IU] via SUBCUTANEOUS
  Administered 2024-05-09: 2 [IU] via SUBCUTANEOUS
  Administered 2024-05-09 – 2024-05-10 (×3): 1 [IU] via SUBCUTANEOUS
  Administered 2024-05-10 (×2): 2 [IU] via SUBCUTANEOUS
  Administered 2024-05-10 – 2024-05-11 (×4): 1 [IU] via SUBCUTANEOUS
  Filled 2024-05-07 (×8): qty 1

## 2024-05-07 MED ORDER — IPRATROPIUM-ALBUTEROL 0.5-2.5 (3) MG/3ML IN SOLN: 3.0000 mL | Freq: Four times a day (QID) | RESPIRATORY_TRACT | Status: DC | PRN

## 2024-05-07 MED ORDER — CYCLOBENZAPRINE HCL 5 MG PO TABS
7.5000 mg | ORAL_TABLET | Freq: Three times a day (TID) | ORAL | Status: DC | PRN
  Administered 2024-05-09: 7.5 mg via ORAL
  Filled 2024-05-07: qty 1.5

## 2024-05-07 MED ORDER — NOREPINEPHRINE 4 MG/250ML-% IV SOLN
0.0000 ug/min | INTRAVENOUS | Status: DC
  Administered 2024-05-07: 16 ug/min via INTRAVENOUS
  Administered 2024-05-08: 9 ug/min via INTRAVENOUS
  Filled 2024-05-07 (×2): qty 250

## 2024-05-07 NOTE — ED Provider Notes (Signed)
 [facility] Provider Note    None    (approximate)   History   Altered Mental Status  Patient brought in by EMS from home after call from home health nurse for altered mental status. Upon EMS arrival lethargic, strong urine smell noted, pt answering yes/no only. Initial EMS BP 80/50, temp 101.5 axillary, HR 110, 96O2 on RA. 1000mg  tylenol  IV, 500NS bolus given in route. Pt lethargic in triage, limited response to questions.    HPI Albert Figueroa is a 68 y.o. male PMH diabetes, COPD, hypertension, CKD, Lewy body dementia, VP shunt following meningitis (remote), seizure disorder, chronic pain syndrome presents for evaluation of altered mental status -Per EMS, initial blood pressure 80/50, febrile to 101.5, tachycardic, satting well on room air.  Received 1000 mg Tylenol  and 500 cc NS. - On my evaluation, patient does respond to all questions appropriately.  Tells me his home health nurse called an ambulance because he was not feeling well.  Does endorse some recent cough.  Denies any pain.      Physical Exam   Triage Vital Signs: BP 93/60 (BP Location: Right Arm)   Pulse 100   Temp 100 F (37.8 C) (Oral)   Resp (!) 22   Ht 5' 11 (1.803 m)   Wt 75.7 kg   SpO2 (!) 88% Comment: Sleeping, O2 applied  BMI 23.28 kg/m    Most recent vital signs: Vitals:   05/07/24 1425 05/07/24 1622  BP: 93/60   Pulse: 100   Resp: (!) 22   Temp: 100 F (37.8 C)   SpO2: 98% (!) 88%     General: Awake, appears ill, sleepy but arousable CV:  Good peripheral perfusion.  Tachycardic, regular rhythm, RP 2+ Resp:  Normal effort. CTAB anteriorly Abd:  No distention. Nontender to deep palpation throughout GU:  Normal anatomy, no rash Neuro:  Moves all extremities spontaneously, no focal motor deficit appreciated, moving all extremity spontaneously   ED Results / Procedures / Treatments   Labs (all labs ordered are listed, but only abnormal results are  displayed) Labs Reviewed  LACTIC ACID, PLASMA - Abnormal; Notable for the following components:      Result Value   Lactic Acid, Venous 2.6 (*)    All other components within normal limits  COMPREHENSIVE METABOLIC PANEL WITH GFR - Abnormal; Notable for the following components:   Potassium 2.8 (*)    BUN 40 (*)    Creatinine, Ser 2.71 (*)    Calcium  8.6 (*)    Albumin 2.5 (*)    Total Bilirubin 1.3 (*)    GFR, Estimated 25 (*)    All other components within normal limits  CBC WITH DIFFERENTIAL/PLATELET - Abnormal; Notable for the following components:   RBC 3.21 (*)    Hemoglobin 10.4 (*)    HCT 32.9 (*)    MCV 102.5 (*)    RDW 20.6 (*)    Platelets 76 (*)    Lymphs Abs 0.5 (*)    All other components within normal limits  PROTIME-INR - Abnormal; Notable for the following components:   Prothrombin Time 17.2 (*)    INR 1.3 (*)    All other components within normal limits  URINALYSIS, W/ REFLEX TO CULTURE (INFECTION SUSPECTED) - Abnormal; Notable for the following components:   Color, Urine YELLOW (*)    APPearance CLEAR (*)    Glucose, UA 150 (*)    Bacteria, UA RARE (*)    All other components  within normal limits  CULTURE, BLOOD (ROUTINE X 2)  CULTURE, BLOOD (ROUTINE X 2)  MRSA NEXT GEN BY PCR, NASAL  LIPASE, BLOOD  LACTIC ACID, PLASMA     EKG  Ecg = sinus rhythm, rate 99, no gross ST elevation, trace depression noted in V2, normal axis, normal intervals.  No clear evidence of ischemia no arrhythmia on my interpretation.   RADIOLOGY Radiology interpreted by myself and radiology report reviewed.  Probable right-sided pneumonia.    PROCEDURES:  Critical Care performed: Yes, see critical care procedure note(s)  .Critical Care  Performed by: Clarine Ozell LABOR, MD Authorized by: Clarine Ozell LABOR, MD   Critical care provider statement:    Critical care time (minutes):  30   Critical care time was exclusive of:  Separately billable procedures and treating other  patients   Critical care was necessary to treat or prevent imminent or life-threatening deterioration of the following conditions:  Sepsis   Critical care was time spent personally by me on the following activities:  Development of treatment plan with patient or surrogate, discussions with consultants, evaluation of patient's response to treatment, examination of patient, ordering and review of laboratory studies, ordering and review of radiographic studies, ordering and performing treatments and interventions, pulse oximetry, re-evaluation of patient's condition and review of old charts   I assumed direction of critical care for this patient from another provider in my specialty: no     Care discussed with: admitting provider      MEDICATIONS ORDERED IN ED: Medications  acetaminophen  (TYLENOL ) tablet 1,000 mg (1,000 mg Oral Not Given 05/07/24 1600)  vancomycin  (VANCOREADY) IVPB 1750 mg/350 mL (1,750 mg Intravenous New Bag/Given 05/07/24 1621)  sodium chloride  0.9 % bolus 500 mL (has no administration in time range)  potassium chloride  10 mEq in 100 mL IVPB (10 mEq Intravenous New Bag/Given 05/07/24 1626)  lactated ringers  infusion (has no administration in time range)  acetaminophen  (TYLENOL ) tablet 650 mg (has no administration in time range)    Or  acetaminophen  (TYLENOL ) suppository 650 mg (has no administration in time range)  ondansetron  (ZOFRAN ) tablet 4 mg (has no administration in time range)    Or  ondansetron  (ZOFRAN ) injection 4 mg (has no administration in time range)  cefTRIAXone  (ROCEPHIN ) 2 g in sodium chloride  0.9 % 100 mL IVPB (has no administration in time range)  azithromycin (ZITHROMAX) 500 mg in sodium chloride  0.9 % 250 mL IVPB (has no administration in time range)  senna-docusate (Senokot-S) tablet 1 tablet (has no administration in time range)  vancomycin  variable dose per unstable renal function (pharmacist dosing) (has no administration in time range)  sodium chloride   0.9 % bolus 1,000 mL (1,000 mLs Intravenous New Bag/Given 05/07/24 1523)  sodium chloride  0.9 % bolus 1,000 mL (1,000 mLs Intravenous New Bag/Given 05/07/24 1540)     IMPRESSION / MDM / ASSESSMENT AND PLAN / ED COURSE  I reviewed the triage vital signs and the nursing notes.                              DDX/MDM/AP: Differential diagnosis includes, but is not limited to, concern for sepsis and is febrile hypotensive and tachycardic patient, consider underlying pneumonia or UTI, do not suspect intra-abdominal infection at this time, no obvious skin or soft tissue source.  He was mentating well with no obvious meningismus, do not clinically suspect meningitis at this time.  Will plan for aggressive fluid resuscitation,  anticipate broad-spectrum antibiotics and admission.  Plan: - Labs  -EKG - Cardiac monitor - Chest x-ray - Anticipate admission  Patient's presentation is most consistent with acute presentation with potential threat to life or bodily function.  The patient is on the cardiac monitor to evaluate for evidence of arrhythmia and/or significant heart rate changes.  ED course below.  Workup notable for evidence of right-sided pneumonia, lactic acidosis, AKI on CKD, hypokalemia to 2.8.  Blood pressure remains soft though maps greater than 65 after 30 cc/kg bolus.  Did develop some mild hypoxia here, improved with nasal cannula.  Treated with broad-spectrum antibiotics.  Potassium repletion initiated.  Admitted to hospitalist service.  Of note, patient did have urinary retention here, initial straight cath performed, can continue to monitor, Foley not yet placed in the emergency department.  Suspect AKI would likely secondary to hypovolemia though low threshold for Foley placement if any concern for recurrent retention.  Clinical Course as of 05/07/24 1634  Fri May 07, 2024  1521 CMP with AKI on CKD Also with hypokalemia 2.8, will replete [MM]  1522 CXR: IMPRESSION: Mild hazy  opacification over the right mid to upper lung which may be due to atelectasis or infection.   [MM]  1531 CBC with no leukocytosis, improved anemia, thrombocytopenia within baseline range [MM]  1532 Lactate elevated at 2.6 Urinalysis not consistent with infection [MM]  1543 Hospitalist consult order placed [MM]    Clinical Course User Index [MM] Clarine Ozell LABOR, MD     FINAL CLINICAL IMPRESSION(S) / ED DIAGNOSES   Final diagnoses:  Sepsis with acute renal failure without septic shock, due to unspecified organism, unspecified acute renal failure type (HCC)  AKI (acute kidney injury) (HCC)  Pneumonia of right middle lobe due to infectious organism  Hypokalemia     Rx / DC Orders   ED Discharge Orders     None        Note:  This document was prepared using Dragon voice recognition software and may include unintentional dictation errors.   Clarine Ozell LABOR, MD 05/07/24 [phone]

## 2024-05-07 NOTE — ED Notes (Signed)
 Bipap in place, O2 83-86. Levophed  started. HR 122, RR 34.

## 2024-05-07 NOTE — Progress Notes (Signed)
 Transported patient on bipap to ICU without events.

## 2024-05-07 NOTE — Hospital Course (Addendum)
 Mr. Albert Figueroa is a 68 year old male with history of non-insulin -dependent diabetes mellitus, hyperlipidemia, history of seizure, who presents emergency department for chief concerns of altered mentation.  Patient had fever of 101.5.  Patient was given  acetaminophen  1000 mg IV, sodium chloride  500 mL liter bolus via EMS.  Vitals in the ED showed Tmax of 100, respiration rate 22, heart rate 100, blood pressure initially 93/60, improved to 77/54, SPO2 of 98% on room air.  Serum sodium is 143, potassium 2.8, chloride 10.5, bicarb 24, BUN of 40, serum creatinine of 2.71, EGFR 34, nonfasting blood glucose 85, WBC 4.3, hemoglobin 10.4, platelets 76.  ED treatment: Potassium chloride  10 mEq IV, 2 doses, sodium chloride  infusion 2.5 L bolus, vancomycin , Zosyn .

## 2024-05-07 NOTE — ED Triage Notes (Signed)
 Patient brought in by EMS from home after call from home health nurse for altered mental status. Upon EMS arrival lethargic, strong urine smell noted, pt answering yes/no only. Initial EMS BP 80/50, temp 101.5 axillary, HR 110, 96O2 on RA. 1000mg  tylenol  IV, 500NS bolus given in route. Pt lethargic in triage, limited response to questions.

## 2024-05-07 NOTE — Consult Note (Addendum)
 .    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   On chart review, patient was recently discharged from the hospital on 04/28/24 following admission for AMS and multifactorial shock iso suspected polypharmacy, adrenal insufficiency vs autonomic dysfunction. Per ED reports, patient's home health nurse called EMS for altered mental status. On EMS arrival, patient was lethargic and able to answer only yes or no questions. Initial EMS BP 80/50, temp 101.5 axillary, HR 110, 96O2 on RA. 1000mg  tylenol  IV, 500NS bolus given in route.   ED Course: Initial vital signs showedBP 93/60  Pulse 100   Temp 100 F (37.8 C) (Oral)   Resp (!) 22  SpO2 (!) 88%, O2 @2L . Pertinent Labs/Diagnostics Findings: Na+/ K+:143/2.8 Glucose:85 BUN/Cr.:40/2.71 WBC:4.3 K/L Hgb/Hct:10.4/32.9 Plts: 76 PCT:16.12 Lactic acid:2.6  UA: rare bacteria COVID PCR: Negative,  VBG: pO2 pend; pCO2 52; pH 7.36;HCO3 29.4, %O2 Sat pend.  CXR> RUL pneumonia Patient given 30 cc/kg of fluids and started on broad-spectrum antibiotics Ceftriaxone  and Azityhromycin for sepsis with septic shock iso pneumonia. Patient admitted to [facility] service. However patient remained hypotensive despite IVF boluses therefore was started on Levophed . PCCM consulted.  Past Medical History  Arthritis  Type II Diabetes Mellitus  Emphysema/COPD  Epilepsy HLD HTN CKD Stage IV Lewy Body Dementia  Lumbar Stenosis  Spinal Meningitis  CSF Shunt  Seizure Tremors in both Hands  Significant Hospital Events   05/07/24:Admitted to [facility] service with sepsis and acute toxic metabolic  encephalopathy 2/2 pneumonia, developed recurrent hypotension requiring levophed  gtt. PCCM consulted   Consults:  PCCM  Procedures:  8/8: Left internal jugular cvc  Interim History / Subjective:     Micro Data:  8/8: SARS-CoV-2 PCR> negative 8/8: Influenza PCR> negative 8/8: Blood culture x2> 8/8: MRSA PCR>>  8/8: Strep pneumo urinary antigen> 8/8: Legionella urinary antigen>  Antimicrobials:  Azithromycin  8/8>> Ceftriaxone  8/8>>  OBJECTIVE  Blood pressure 115/71, pulse (!) 118, temperature 99.3 F (37.4 C), temperature source Axillary, resp. rate (!) 25, height 5' 11 (1.803 m), weight 75.7 kg, SpO2 98%.    FiO2 (%):  [70 %-100 %] 70 %  No intake or output data in the 24 hours ending 05/08/24 0117 Filed Weights   05/07/24 1430  Weight: 75.7 kg    Physical Examination  GENERAL: 68 year-old critically ill patient lying in the bed on BiPAP  EYES: PEERLA. No scleral icterus. Extraocular muscles intact.  HEENT: Head atraumatic, normocephalic. Oropharynx and nasopharynx clear.  CARDIOVASCULAR:S1, S2 normal. No murmurs, rubs, or gallops.  Intermittently tachycardic PULMONARY: Lungs sounds diminished at the bases ABDOMINAL: Soft, NTND MUSCULOSKELETAL:Generalized dependent edema with BLE +2 edema.   NEUROLOGICAL: General: No focal deficit present. Lethargic  PSYCHIATRIC: UTA on BiPAP SKIN:Skin is warm and dry. No obvious rash, lesion, or ulcer. Capillary Refill:< 2sec   Labs/imaging that I havepersonally reviewed  (right click and Reselect all SmartList Selections daily)    DG Chest 1 View Result Date: 05/07/2024 CLINICAL DATA:  Central venous catheter placement EXAM: CHEST  1 VIEW COMPARISON:  05/07/2024 FINDINGS: Left internal jugular central venous catheter tip overlies the expected superior vena  cava. Progressive consolidation within the right upper lobe. Lung volumes are small, but stable since prior examination. No pneumothorax or pleural effusion. Cardiac size  within normal limits. Right vascularity is normal. Ventriculoperitoneal shunt catheter tubing overlies the right hemithorax. No acute bone abnormality. IMPRESSION: 1. Left internal jugular central venous catheter tip within the superior vena cava. No pneumothorax. 2. Progressive right upper lobe consolidation. Follow-up chest radiograph is recommended in 3-4 weeks, following conservative therapy, to document resolution. If persistent at that time, dedicated contrast enhanced CT imaging of the chest is recommended for further evaluation. Electronically Signed   By: Dorethia Molt M.D.   On: 05/07/2024 23:36   DG Chest Port 1 View Result Date: 05/07/2024 CLINICAL DATA:  Shortness of breath EXAM: PORTABLE CHEST 1 VIEW COMPARISON:  Chest x-ray 05/07/2024 FINDINGS: Dense focal airspace opacity in the right upper lobe has increased in size and density. Left lung is clear. Costophrenic angles are clear. No pneumothorax. Cardiomediastinal silhouette is within normal limits. Osseous structures are stable. Catheter overlies the right chest, unchanged. IMPRESSION: Increasing focal airspace opacity in the right upper lobe. Findings are concerning for pneumonia. Follow-up imaging recommended to confirm complete resolution. Electronically Signed   By: Greig Pique M.D.   On: 05/07/2024 19:19   DG Chest Port 1 View Result Date: 05/07/2024 CLINICAL DATA:  Questionable sepsis.  Altered mental status. EXAM: PORTABLE CHEST 1 VIEW COMPARISON:  04/17/2024 FINDINGS: Catheter over the right neck, chest and abdomen likely ventriculostomy catheter unchanged. Lungs are adequately inflated with slight elevation of the left hemidiaphragm. Mild hazy opacification over the right mid to upper lung which may be due to atelectasis or infection. No effusion. Cardiomediastinal silhouette and remainder of the exam is unchanged. IMPRESSION: Mild hazy opacification over the right mid to upper lung which may be due to atelectasis or infection.  Electronically Signed   By: Toribio Agreste M.D.   On: 05/07/2024 15:04    Labs   CBC: Recent Labs  Lab 05/07/24 1438  WBC 4.3  NEUTROABS 3.3  HGB 10.4*  HCT 32.9*  MCV 102.5*  PLT 76*   Basic Metabolic Panel: Recent Labs  Lab 05/07/24 1430 05/07/24 1438  NA  --  143  K  --  2.8*  CL  --  105  CO2  --  24  GLUCOSE  --  85  BUN  --  40*  CREATININE  --  2.71*  CALCIUM   --  8.6*  MG 1.7  --    GFR: Estimated Creatinine Clearance: 28.2 mL/min (A) (by C-G formula based on SCr of 2.71 mg/dL (H)). Recent Labs  Lab 05/07/24 1438 05/07/24 1650 05/07/24 2015  PROCALCITON  --   --  16.12  WBC 4.3  --   --   LATICACIDVEN 2.6* 2.2*  --    Liver Function Tests: Recent Labs  Lab 05/07/24 1438  AST 24  ALT 10  ALKPHOS 63  BILITOT 1.3*  PROT 7.4  ALBUMIN 2.5*   Recent Labs  Lab 05/07/24 1438  LIPASE 22   No results for input(s): AMMONIA in the last 168 hours.  ABG    Component Value Date/Time   HCO3 29.4 (H) 05/07/2024 1848   O2SAT PENDING 05/07/2024 1848   Coagulation Profile: Recent Labs  Lab 05/07/24 1438  INR 1.3*   Cardiac Enzymes: No results for input(s): CKTOTAL, CKMB, CKMBINDEX, TROPONINI in the last 168 hours.  HbA1C: Hgb A1c MFr Bld  Date/Time Value Ref Range Status  01/16/2024 05:28  AM 4.2 (L) 4.8 - 5.6 % Final    Comment:    (NOTE) Pre diabetes:          5.7%-6.4%  Diabetes:              >6.4%  Glycemic control for   <7.0% adults with diabetes   09/01/2022 06:41 PM 5.0 4.8 - 5.6 % Final    Comment:    (NOTE)         Prediabetes: 5.7 - 6.4         Diabetes: >6.4         Glycemic control for adults with diabetes: <7.0     CBG: Recent Labs  Lab 05/07/24 2153  GLUCAP 83   Review of Systems:   Unable to be obtained secondary to the patient's altered mental status.   Past Medical History  He,  has a past medical history of Arthritis, Diabetes mellitus without complication (HCC), Emphysema/COPD (HCC), Epilepsy  (HCC), History of brain shunt, Hyperlipidemia, Hypertension, Kidney disease, Lewy body dementia (HCC), Lumbar stenosis, Meningitis spinal, Osteoarthrosis, Seizure (HCC), Stage 4 chronic kidney disease (HCC), and Tremor of both hands.   Surgical History    Past Surgical History:  Procedure Laterality Date   CATARACT EXTRACTION W/PHACO Right 05/14/2023   Procedure: CATARACT EXTRACTION PHACO AND INTRAOCULAR LENS PLACEMENT (IOC) RIGHT DIABETIC;  Surgeon: Mittie Gaskin, MD;  Location: [facility];  Service: Ophthalmology;  Laterality: Right;  6.70 0:39.8   CATARACT EXTRACTION W/PHACO Left 05/28/2023   Procedure: CATARACT EXTRACTION PHACO AND INTRAOCULAR LENS PLACEMENT (IOC) LEFT DIABETIC 6.34 00:37.1;  Surgeon: Mittie Gaskin, MD;  Location: [facility];  Service: Ophthalmology;  Laterality: Left;   CSF SHUNT     placed in 1980s   KNEE SURGERY     multiple surgeries on both knees     Social History   reports that he has been smoking cigarettes. He has a 96 pack-year smoking history. He has never used smokeless tobacco. He reports that he does not drink alcohol and does not use drugs.   Family History   His family history includes CAD in his father; Cancer in his mother; Diabetes in his mother.   Allergies Allergies  Allergen Reactions   Aricept  [Donepezil ] Other (See Comments)    SEVERE BRADYCARDIA WITH HEART BLOCK DO NOT RESTART AT DISCHARGE   Gabapentin  Other (See Comments)    Lethargic, Confused   Amlodipine     Other reaction(s): Other (see comments) Caused elevated blood pressure and headaches   Ozempic (0.25 Or 0.5 Mg-Dose) [Semaglutide(0.25 Or 0.5mg -Dos)]     Lethargy.  Also dropped blood sugars too low   Home Medications  Prior to Admission medications   Medication Sig Start Date End Date Taking? Authorizing Provider  acetaminophen  (TYLENOL ) 325 MG tablet Take 2 tablets (650 mg total) by mouth every 6 (six) hours as needed for mild pain (or Fever  >/= 101). 09/07/22   Krishnan, Sendil K, MD  albuterol  (VENTOLIN  HFA) 108 (90 Base) MCG/ACT inhaler Inhale into the lungs every 6 (six) hours as needed for wheezing or shortness of breath.    [provider]  cyclobenzaprine  (FEXMID ) 7.5 MG tablet Take 1 tablet (7.5 mg total) by mouth 3 (three) times daily as needed for muscle spasms. Home med. 04/28/24   Awanda City, MD  dexlansoprazole (DEXILANT) 60 MG capsule Take 60 mg by mouth daily.    [provider]  divalproex  (DEPAKOTE  ER) 500 MG 24 hr tablet Take 1,000 mg  by mouth in the morning and at bedtime.    [provider]  donepezil  (ARICEPT  ODT) 10 MG disintegrating tablet Take 10 mg by mouth at bedtime.    [provider]  feeding supplement (ENSURE PLUS HIGH PROTEIN) LIQD Take 237 mLs by mouth 3 (three) times daily between meals. 04/28/24   Awanda City, MD  fenofibrate  160 MG tablet Take 160 mg by mouth daily. 04/25/22   [provider]  ferrous sulfate  325 (65 FE) MG tablet Take 325 mg by mouth daily with breakfast.    [provider]  Fluticasone -Umeclidin-Vilant (TRELEGY ELLIPTA) 100-62.5-25 MCG/ACT AEPB Inhale into the lungs daily.    [provider]  hydrOXYzine  (ATARAX ) 10 MG tablet Take 10 mg by mouth 3 (three) times daily as needed.    [provider]  lidocaine  (LIDODERM ) 5 % Place 1 patch onto the skin daily. 04/05/24   [provider]  midodrine  (PROAMATINE ) 2.5 MG tablet Take 1 tablet (2.5 mg total) by mouth 3 (three) times daily with meals. 04/28/24   Awanda City, MD  Multiple Vitamin (MULTIVITAMIN WITH MINERALS) TABS tablet Take 1 tablet by mouth daily. 04/29/24   Awanda City, MD  naloxone  (NARCAN ) nasal spray 4 mg/0.1 mL Place 1 spray into the nose once as needed for up to 1 dose. 04/28/24   Awanda City, MD  Oxycodone  HCl 20 MG TABS Take 0.5 tablets (10 mg total) by mouth every 4 (four) hours as needed. Home med.  Reduced from 20 mg. 04/28/24   Awanda City, MD   polyethylene glycol (MIRALAX  / GLYCOLAX ) 17 g packet Take 17 g by mouth daily. 04/28/24   Awanda City, MD  rosuvastatin  (CRESTOR ) 20 MG tablet Take 20 mg by mouth at bedtime. 04/08/22   [provider]  Scheduled Meds:  acetaminophen   1,000 mg Oral Once   budesonide -glycopyrrolate -formoterol   2 puff Inhalation BID   Chlorhexidine  Gluconate Cloth  6 each Topical Daily   divalproex   1,000 mg Oral BID   feeding supplement  237 mL Oral TID BM   ferrous sulfate   325 mg Oral Q breakfast   hydrocortisone  sod succinate (SOLU-CORTEF ) inj  100 mg Intravenous Q8H   insulin  aspart  0-5 Units Subcutaneous QHS   insulin  aspart  0-9 Units Subcutaneous TID WC   midodrine   2.5 mg Oral TID WC   pantoprazole   40 mg Oral Daily   rosuvastatin   20 mg Oral QHS   sodium chloride  flush  10-40 mL Intracatheter Q12H   vancomycin  variable dose per unstable renal function (pharmacist dosing)   Does not apply See admin instructions   Continuous Infusions:  sodium chloride      azithromycin      cefTRIAXone  (ROCEPHIN )  IV     lactated ringers  150 mL/hr (05/07/24 1710)   norepinephrine  (LEVOPHED ) Adult infusion 16 mcg/min (05/07/24 2219)   potassium chloride  10 mEq (05/08/24 0108)   vasopressin  0.04 Units/min (05/07/24 2144)   PRN Meds:.acetaminophen  **OR** acetaminophen , cyclobenzaprine , diazepam , hydrOXYzine , ipratropium-albuterol , nicotine , ondansetron  **OR** ondansetron  (ZOFRAN ) IV, senna-docusate, sodium chloride  flush  Active Hospital Problem list   See systems below  Assessment & Plan:  #Acute Hypoxic Respiratory Failure #Pneumonia #Emphysema/COPD without exacerbation  -BiPAP, wean as tolerated -titrate FiO2, PEEP to maintain O2 sat >90%  -PRN Chest X-ray & ABG -PRN and scheduled bronchodilators -Start systemic steroid   #Sepsis with septic shock due to Pneumonia Initial interventions/workup included: 2 L of NS/LR & Ceftriaxone /Azithromycin  -F/u cultures, trend lactic/ PCT -Monitor WBC/  fever curve -Obtain  MRSA nasal swab, RVP, legionella, strep urine Ag -IV antibiotics Ceftriaxone  AND Azithromycin  -Gentle IVF hydration as needed -Pressors PRN for MAP goal >65 -Strict I/O's  #Septic vs Multifactorial shock:  Hx: HLD and HTN  -Echocardiogram 04/17/24: LVEF 60-65%, grade I DD, RV systolic function is normal, RV size normal -Vasopressors as needed to maintain MAP goal  -Continue Midodrine  10 mg TID -start Stress Dose Steroids -trend lactate -HS Troponin is normal  #Acute Toxic Metabolic Encephalopathy  #Seizure disorder #Chronic pain  #Recurrent falls  Hx: Lewy body dementia, meningitis s/p VP shunt, and epilepsy  -Check TSH, depakote  levels -treat underlying infectious process -Consider CT head if no improvement in mental status -seizure precautions -Continue Depakote  -Avoid sedatives   #AKI on CKD stage III: AKI likely iso above #Hypokalemia -trend Lactate -F/u BMP+mag -promote renal perfusion -Avoid nephrotoxic agents  -Replete/correct lytes -strict I&Os  #Acute on Chronic Thrombocytopenia~on depakote  which could be causing thrombocytopenia #Macrocytic Anemia ~ no signs of active bleeding -Monitor for S/Sx of bleeding -Trend CBC -SCD's for VTE Prophylaxis (no chemical ppx given thrombocytopenia) -Transfuse for Hgb <7 -Transfuse platelets for platelets as indicated  #Type II Diabetes Mellitus -CBG's q4h; Target range of 140 to 180 -SSI -Follow ICU Hypo/Hyperglycemia protocol  Best practice:  Diet:  NPO Pain/Anxiety/Delirium protocol (if indicated): No VAP protocol (if indicated): Not indicated DVT prophylaxis: LMWH GI prophylaxis: PPI Glucose control:  SSI Yes Central venous access:  Yes, and it is still needed Arterial line:  N/A Foley:  Yes, and it is still needed Mobility:  bed rest  PT consulted: N/A Last date of multidisciplinary goals of care discussion []  Code Status:  DNR Disposition: ICU   = Goals of Care = Code Status  Order: DNR  Primary Emergency Contact: Lynn,[name] Patient wishes to pursue ongoing treatment, but concurred that if deteriorated to pulselessness, patient would prefer a natural death as opposed to invasive measures such as CPR and intubation.  Family should be immediately contacted in such situations.  Critical care time: 45 minutes        [name] DNP, CCRN, FNP-C, AGACNP-BC Acute Care & Family Nurse Practitioner [facility] PCCM on call pager [phone]

## 2024-05-07 NOTE — Assessment & Plan Note (Signed)
 -  As needed nicotine patch ordered ?

## 2024-05-07 NOTE — Consult Note (Signed)
 Pharmacy Antibiotic Note  Albert Figueroa is a 68 y.o. male admitted on 05/07/2024 with sepsis / pneumonia.  Pharmacy has been consulted for vancomycin  dosing.  Plan:  Vancomycin  1.75 g IV loading dose and will follow-up Scr with AM labs tomorrow to decide on maintenance dose  Height: 5' 11 (180.3 cm) Weight: 75.7 kg (166 lb 14.2 oz) IBW/kg (Calculated) : 75.3  Temp (24hrs), Avg:100 F (37.8 C), Min:100 F (37.8 C), Max:100 F (37.8 C)  Recent Labs  Lab 05/07/24 1438  WBC 4.3  CREATININE 2.71*  LATICACIDVEN 2.6*    Estimated Creatinine Clearance: 28.2 mL/min (A) (by C-G formula based on SCr of 2.71 mg/dL (H)).    Allergies  Allergen Reactions   Aricept  [Donepezil ] Other (See Comments)    SEVERE BRADYCARDIA WITH HEART BLOCK DO NOT RESTART AT DISCHARGE   Gabapentin  Other (See Comments)    Lethargic, Confused   Amlodipine     Other reaction(s): Other (see comments) Caused elevated blood pressure and headaches   Ozempic (0.25 Or 0.5 Mg-Dose) [Semaglutide(0.25 Or 0.5mg -Dos)]     Lethargy.  Also dropped blood sugars too low    Antimicrobials this admission: Azithromycin 8/8 >>  Ceftriaxone  8/8 >>  Vancomycin  8/8 >>   Dose adjustments this admission: N/A  Microbiology results: 8/8 BCx: pending 8/8 MRSA PCR: pending  Thank you for allowing pharmacy to be a part of this patient's care.  Kairie Vangieson B Aidee Latimore 05/07/2024 4:14 PM

## 2024-05-07 NOTE — Assessment & Plan Note (Signed)
 Suspect secondary to right middle lobe pneumonia Organ involvement is renal Blood cultures x 2 are in process Lactic acid is downtrending as appropriate Continue with ceftriaxone  2 g IV daily, vancomycin  per pharmacy, azithromycin 500 mg IV twice daily Maintain MAP greater than 65

## 2024-05-07 NOTE — H&P (Signed)
 History and Physical   Albert Figueroa FMW:[id number] DOB: Nov 26, 1955 DOA: 05/07/2024  PCP: Alla Amis, MD  Patient coming from: Via EMS  I have personally briefly reviewed patient's old medical records in [facility] EMR.  Chief Concern: Altered mentation  HPI: Albert Figueroa is a 68 year old male with history of non-insulin -dependent diabetes mellitus, hyperlipidemia, history of seizure, who presents emergency department for chief concerns of altered mentation.  Patient had fever of 101.5.  Patient was given  acetaminophen  1000 mg IV, sodium chloride  500 mL liter bolus via EMS.  Vitals in the ED showed Tmax of 100, respiration rate 22, heart rate 100, blood pressure initially 93/60, improved to 77/54, SPO2 of 98% on room air.  Serum sodium is 143, potassium 2.8, chloride 10.5, bicarb 24, BUN of 40, serum creatinine of 2.71, EGFR 34, nonfasting blood glucose 85, WBC 4.3, hemoglobin 10.4, platelets 76.  ED treatment: Potassium chloride  10 mEq IV, 2 doses, sodium chloride  infusion 2.5 L bolus, vancomycin , Zosyn . -------------------------------------- At bedside, patient able to tell me his first and last name, current calendar year, current location.  He was not able to tell me his age.  He kept saying he is 68 years old.  He appears sleepy at bedside and arousable with verbal stimuli and sternal rub.  Patient denies being in pain.  He reports no shortness of breath.  He endorses cough.  He was unable to tell me if he is coughing up anything.  ROS: Unable to complete due to patient acuity  ED Course: Discussed with EDP, patient requiring hospitalization for chief concerns of  Assessment/Plan  Principal Problem:   Severe sepsis with acute organ dysfunction (HCC) Active Problems:   Tobacco use   Type 2 diabetes mellitus with renal complication (HCC)   Hyperlipidemia, unspecified   Type 2 diabetes mellitus with chronic kidney disease, without long-term current use of insulin   (HCC)   Seizure disorder (HCC)   Assessment and Plan:  * Severe sepsis with acute organ dysfunction (HCC) Suspect secondary to right middle lobe pneumonia Organ involvement is renal Blood cultures x 2 are in process Lactic acid is downtrending as appropriate Continue with ceftriaxone  2 g IV daily, vancomycin  per pharmacy, azithromycin 500 mg IV twice daily Maintain MAP greater than 65  Tobacco use As needed nicotine  patch ordered  Seizure disorder (HCC) Home Depakote  1000 mg p.o. twice daily resumed Diazepam  5 mg IV as needed for anxiety, seizure, 3 doses ordered with instruction to administer as appropriate and then let provider know  Type 2 diabetes mellitus with chronic kidney disease, without long-term current use of insulin  (HCC) Insulin  SSI with at bedtime coverage ordered  Hyperlipidemia, unspecified Home rosuvastatin  20 mg nightly resumed  Chart reviewed.   DVT prophylaxis: TED hose;Pharmacologic DVT not initiated on admission.  AM team to initiate pharmacologic DVT when the benefits outweigh the risk. Code Status: DNR/DNI Diet: N.p.o. 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1;  Surgeon: Mittie Gaskin, MD;  Location: [facility];  Service: Ophthalmology;  Laterality: Left;   CSF SHUNT     placed in 1980s   KNEE SURGERY     multiple surgeries on both knees   Social History:  reports that he has been smoking cigarettes. He has a 96 pack-year smoking history. He has never used smokeless tobacco. He reports that he does not drink alcohol and does not use drugs.  Allergies  Allergen Reactions   Aricept  [Donepezil ] Other (See Comments)    SEVERE BRADYCARDIA WITH HEART BLOCK DO NOT RESTART AT DISCHARGE   Gabapentin  Other (See Comments)    Lethargic, Confused   Amlodipine     Other reaction(s): Other (see comments) Caused elevated blood pressure and headaches   Ozempic (0.25 Or 0.5 Mg-Dose) [Semaglutide(0.25 Or 0.5mg -Dos)]     Lethargy.  Also dropped blood sugars too low   Family History  Problem Relation Age of Onset   Diabetes Mother    Cancer Mother    CAD Father    Family history: Family history reviewed and not pertinent.  Prior to Admission medications   Medication Sig Start Date End Date Taking? Authorizing Provider  acetaminophen  (TYLENOL ) 325 MG tablet Take 2 tablets (650 mg total) by mouth every 6 (six) hours as needed for mild pain (or Fever >/= 101). 09/07/22   Krishnan, Sendil K, MD  albuterol  (VENTOLIN  HFA) 108 [phone] Base) MCG/ACT inhaler Inhale into the lungs every 6 (six) hours as needed for wheezing or shortness of breath.    [provider]  cyclobenzaprine  (FEXMID ) 7.5 MG tablet Take 1 tablet (7.5 mg total) by mouth 3 (three) times daily as needed for  muscle spasms. Home med. 04/28/24   Awanda City, MD  dexlansoprazole (DEXILANT) 60 MG capsule Take 60 mg by mouth daily.    [provider]  divalproex  (DEPAKOTE  ER) 500 MG 24 hr tablet Take 1,000 mg by mouth in the morning and at bedtime.    [provider]  donepezil  (ARICEPT  ODT) 10 MG disintegrating tablet Take 10 mg by mouth at bedtime.    [provider]  feeding supplement (ENSURE PLUS HIGH PROTEIN) LIQD Take 237 mLs by mouth 3 (three) times daily between meals. 04/28/24   Awanda City, MD  fenofibrate  160 MG tablet Take 160 mg by mouth daily. 04/25/22   [provider]  ferrous sulfate  325 (65 FE) MG tablet Take 325 mg by mouth daily with breakfast.    [provider]  Fluticasone -Umeclidin-Vilant (TRELEGY ELLIPTA) 100-62.5-25 MCG/ACT AEPB Inhale into the lungs daily.    [provider]  hydrOXYzine  (ATARAX ) 10 MG tablet Take 10 mg by mouth 3 (three) times daily as needed.    [provider]  lidocaine  (LIDODERM ) 5 %  Place 1 patch onto the skin daily. 04/05/24   [provider]  midodrine  (PROAMATINE ) 2.5 MG tablet Take 1 tablet (2.5 mg total) by mouth 3 (three) times daily with meals. 04/28/24   Awanda City, MD  Multiple Vitamin (MULTIVITAMIN WITH MINERALS) TABS tablet Take 1 tablet by mouth daily. 04/29/24   Awanda City, MD  naloxone  (NARCAN ) nasal spray 4 mg/0.1 mL Place 1 spray into the nose once as needed for up to 1 dose. 04/28/24   Awanda City, MD  Oxycodone  HCl 20 MG TABS Take 0.5 tablets (10 mg total) by mouth every 4 (four) hours as needed. Home med.  Reduced from 20 mg. 04/28/24   Awanda City, MD  polyethylene glycol (MIRALAX  / GLYCOLAX ) 17 g packet Take 17 g by mouth daily. 04/28/24   Awanda City, MD  rosuvastatin  (CRESTOR ) 20 MG tablet Take 20 mg by mouth at bedtime. 04/08/22   [provider]   Physical Exam: Vitals:   05/07/24 1500 05/07/24 1530 05/07/24 1622 05/07/24 1645  BP: 99/70 (!) 89/58  91/61  Pulse: 100 85  89   Resp: 17 20  17   Temp:      TempSrc:      SpO2: 100% 98% (!) 88% 96%  Weight:      Height:       Constitutional: appears frail,, NAD, calm Eyes: PERRL, lids and conjunctivae normal ENMT: Mucous membranes are moist. Posterior pharynx clear of any exudate or lesions. Age-appropriate dentition. Hearing appropriate Neck: normal, supple, no masses, no thyromegaly Respiratory: clear to auscultation bilaterally, no wheezing, no crackles. Normal respiratory effort. No accessory muscle use.  Cardiovascular: Regular rate and rhythm, no murmurs / rubs / gallops.  Bilateral lower extremity edema. 2+ pedal pulses. No carotid bruits.  Abdomen: Obese abdomen, no tenderness, no masses palpated, no hepatosplenomegaly. Bowel sounds positive.  Musculoskeletal: no clubbing / cyanosis. No joint deformity upper and lower extremities. Good ROM, no contractures, no atrophy. Normal muscle tone.  Skin: no rashes, lesions, ulcers. No induration Neurologic: Sensation intact. Strength 5/5 in all 4.  Psychiatric: Normal judgment and insight. Alert and oriented x 3. Normal mood.   EKG: independently reviewed, showing sinus rhythm with rate of 99, QTc 475  Chest x-ray on Admission: I personally reviewed and I agree with radiologist reading as below.  DG Chest Port 1 View Result Date: 05/07/2024 CLINICAL DATA:  Questionable sepsis.  Altered mental status. EXAM: PORTABLE CHEST 1 VIEW COMPARISON:  04/17/2024 FINDINGS: Catheter over the right neck, chest and abdomen likely ventriculostomy catheter unchanged. Lungs are adequately inflated with slight elevation of the left hemidiaphragm. Mild hazy opacification over the right mid to upper lung which may be due to atelectasis or infection. No effusion. Cardiomediastinal silhouette and remainder of the exam is unchanged. IMPRESSION: Mild hazy opacification over the right mid to upper lung which may be due to atelectasis or infection. Electronically Signed   By: Toribio Agreste M.D.    On: 05/07/2024 15:04   Labs on Admission: I have personally reviewed following labs  CBC: Recent Labs  Lab 05/07/24 1438  WBC 4.3  NEUTROABS 3.3  HGB 10.4*  HCT 32.9*  MCV 102.5*  PLT 76*   Basic Metabolic Panel: Recent Labs  Lab 05/07/24 1438  NA 143  K 2.8*  CL 105  CO2 24  GLUCOSE 85  BUN 40*  CREATININE 2.71*  CALCIUM  8.6*   GFR: Estimated Creatinine Clearance: 28.2 mL/min (A) (by C-G formula based on SCr of 2.71 mg/dL (  H)).  Liver Function Tests: Recent Labs  Lab 05/07/24 1438  AST 24  ALT 10  ALKPHOS 63  BILITOT 1.3*  PROT 7.4  ALBUMIN 2.5*   Recent Labs  Lab 05/07/24 1438  LIPASE 22   Coagulation Profile: Recent Labs  Lab 05/07/24 1438  INR 1.3*   Urine analysis:    Component Value Date/Time   COLORURINE YELLOW (A) 05/07/2024 1502   APPEARANCEUR CLEAR (A) 05/07/2024 1502   LABSPEC 1.012 05/07/2024 1502   PHURINE 5.0 05/07/2024 1502   GLUCOSEU 150 (A) 05/07/2024 1502   HGBUR NEGATIVE 05/07/2024 1502   BILIRUBINUR NEGATIVE 05/07/2024 1502   KETONESUR NEGATIVE 05/07/2024 1502   PROTEINUR NEGATIVE 05/07/2024 1502   NITRITE NEGATIVE 05/07/2024 1502   LEUKOCYTESUR NEGATIVE 05/07/2024 1502   CRITICAL CARE Performed by: Dr. Sherre  Total critical care time: 32 minutes  Critical care time was exclusive of separately billable procedures and treating other patients.  Critical care was necessary to treat or prevent imminent or life-threatening deterioration.  Critical care was time spent personally by me on the following activities: development of treatment plan with patient as well as nursing, discussions with consultants, evaluation of patient's response to treatment, examination of patient, obtaining history from patient or surrogate, ordering and performing treatments and interventions, ordering and review of laboratory studies, ordering and review of radiographic studies, pulse oximetry and re-evaluation of patient's condition.  This  document was prepared using Dragon Voice Recognition software and may include unintentional dictation errors.  Dr. Sherre [facility]  If 7PM-7AM, please contact overnight-coverage provider If 7AM-7PM, please contact day attending provider [email or web address]  05/07/2024, 6:03 PM

## 2024-05-07 NOTE — Progress Notes (Signed)
 eLink Physician-Brief Progress Note Patient Name: EVARISTO TSUDA DOB: 12-Dec-1955 MRN: [id number]   Date of Service  05/07/2024  HPI/Events of Note  68 year old male with a history of diabetes, dyslipidemia, seizure disorder who initially came in with altered mentation found to have septic shock likely secondary pneumonia.  Patient is tachypneic, tachycardic, and hypotensive saturating 92% on supplemental oxygen.  On norepinephrine  infusion.  Results consistent with severe hypokalemia, elevated creatinine, anemia and thrombocytopenia.  Imaging consistent with dense pneumonia of the right middle lobe.  eICU Interventions  Maintain antibiotics for community-acquired pneumonia.  Scheduled SVNs  Shock support with norepinephrine , add vasopressin  and stress dose steroids.  Continue crystalloid infusion  DVT prophylaxis with SCDs home pantoprazole  for GI prophylaxis DNR/DNI     Intervention Category Evaluation Type: New Patient Evaluation  Jarin Cornfield 05/07/2024, 8:52 PM

## 2024-05-07 NOTE — ED Notes (Signed)
 Pt O2 85, RR 35, audible wheezing. Oxygen increased to 10L NRB, fluids paused. Dr Sherre made aware.

## 2024-05-07 NOTE — Assessment & Plan Note (Signed)
 Home rosuvastatin 20 mg nightly resumed

## 2024-05-07 NOTE — Significant Event (Signed)
 [facility] progress note  I was paged and secure chatted by nursing for SpO2 of 85%, on 10 L nonrebreather.  Patient wheezing.  I presented to bedside, MAP is 61, heart rate 116, respiration rate 40, SpO2 92% on BiPAP therapy.  Respiratory therapist at bedside.  # Severe sepsis with shock and fluid overload Discontinue IV fluid Levophed  initiated Discussed with ICU for transfer to ICU service  Dr. Sherre

## 2024-05-07 NOTE — Assessment & Plan Note (Signed)
 Insulin SSI with at bedtime coverage ordered

## 2024-05-07 NOTE — Assessment & Plan Note (Addendum)
 Home Depakote  1000 mg p.o. twice daily resumed Diazepam  5 mg IV as needed for anxiety, seizure, 3 doses ordered with instruction to administer as appropriate and then let provider know

## 2024-05-07 NOTE — Consult Note (Signed)
 ED Pharmacy Antibiotic Sign Off An antibiotic consult was received from an ED provider for vancomycin  per pharmacy dosing for pneumonia. A chart review was completed to assess appropriateness.   The following one time order(s) were placed:  --Vancomycin  1.75 g IV  Further antibiotic and/or antibiotic pharmacy consults should be ordered by the admitting provider if indicated.   Thank you for allowing pharmacy to be a part of this patient's care.   Marolyn KATHEE Mare, [facility] 05/07/24 3:32 PM

## 2024-05-08 DIAGNOSIS — R6521 Severe sepsis with septic shock: Secondary | ICD-10-CM | POA: Diagnosis not present

## 2024-05-08 DIAGNOSIS — Z515 Encounter for palliative care: Secondary | ICD-10-CM | POA: Diagnosis not present

## 2024-05-08 DIAGNOSIS — G3183 Dementia with Lewy bodies: Secondary | ICD-10-CM

## 2024-05-08 DIAGNOSIS — F0280 Dementia in other diseases classified elsewhere without behavioral disturbance: Secondary | ICD-10-CM

## 2024-05-08 DIAGNOSIS — A419 Sepsis, unspecified organism: Secondary | ICD-10-CM | POA: Diagnosis not present

## 2024-05-08 DIAGNOSIS — J441 Chronic obstructive pulmonary disease with (acute) exacerbation: Secondary | ICD-10-CM | POA: Diagnosis not present

## 2024-05-08 DIAGNOSIS — J9601 Acute respiratory failure with hypoxia: Secondary | ICD-10-CM | POA: Diagnosis not present

## 2024-05-08 DIAGNOSIS — N179 Acute kidney failure, unspecified: Secondary | ICD-10-CM | POA: Diagnosis not present

## 2024-05-08 DIAGNOSIS — J189 Pneumonia, unspecified organism: Secondary | ICD-10-CM | POA: Diagnosis not present

## 2024-05-08 LAB — RESPIRATORY PANEL BY PCR

## 2024-05-08 LAB — CBC
HCT: 26 % — ABNORMAL LOW (ref 39.0–52.0)
Hemoglobin: 8.6 g/dL — ABNORMAL LOW (ref 13.0–17.0)
MCH: 33.3 pg (ref 26.0–34.0)
MCHC: 33.1 g/dL (ref 30.0–36.0)
MCV: 100.8 fL — ABNORMAL HIGH (ref 80.0–100.0)
Platelets: 71 K/uL — ABNORMAL LOW (ref 150–400)
RBC: 2.58 MIL/uL — ABNORMAL LOW (ref 4.22–5.81)
RDW: 20.4 % — ABNORMAL HIGH (ref 11.5–15.5)
WBC: 3.9 K/uL — ABNORMAL LOW (ref 4.0–10.5)
nRBC: 0 % (ref 0.0–0.2)

## 2024-05-08 LAB — BLOOD GAS, VENOUS
Acid-Base Excess: 2.8 mmol/L — ABNORMAL HIGH (ref 0.0–2.0)
Bicarbonate: 29.4 mmol/L — ABNORMAL HIGH (ref 20.0–28.0)
Patient temperature: 37
pCO2, Ven: 52 mmHg (ref 44–60)
pH, Ven: 7.36 (ref 7.25–7.43)

## 2024-05-08 LAB — VALPROIC ACID LEVEL: Valproic Acid Lvl: 64 ug/mL (ref 50–100)

## 2024-05-08 LAB — BASIC METABOLIC PANEL WITH GFR
Anion gap: 12 (ref 5–15)
BUN: 45 mg/dL — ABNORMAL HIGH (ref 8–23)
CO2: 22 mmol/L (ref 22–32)
Calcium: 7.8 mg/dL — ABNORMAL LOW (ref 8.9–10.3)
Chloride: 108 mmol/L (ref 98–111)
Creatinine, Ser: 2.64 mg/dL — ABNORMAL HIGH (ref 0.61–1.24)
GFR, Estimated: 26 mL/min — ABNORMAL LOW (ref >60)
Glucose, Bld: 113 mg/dL — ABNORMAL HIGH (ref 70–99)
Potassium: 3.5 mmol/L (ref 3.5–5.1)
Sodium: 142 mmol/L (ref 135–145)

## 2024-05-08 LAB — BLOOD GAS, ARTERIAL
Acid-base deficit: 2.4 mmol/L — ABNORMAL HIGH (ref 0.0–2.0)
Bicarbonate: 21.6 mmol/L (ref 20.0–28.0)
Delivery systems: POSITIVE
Expiratory PAP: 8 cmH2O
FIO2: 35 %
Inspiratory PAP: 12 cmH2O
O2 Saturation: 99.1 %
Patient temperature: 37
pCO2 arterial: 34 mmHg (ref 32–48)
pH, Arterial: 7.41 (ref 7.35–7.45)
pO2, Arterial: 97 mmHg (ref 83–108)

## 2024-05-08 LAB — PROCALCITONIN: Procalcitonin: 34.45 ng/mL

## 2024-05-08 LAB — GLUCOSE, CAPILLARY
Glucose-Capillary: 109 mg/dL — ABNORMAL HIGH (ref 70–99)
Glucose-Capillary: 142 mg/dL — ABNORMAL HIGH (ref 70–99)
Glucose-Capillary: 130 mg/dL — ABNORMAL HIGH (ref 70–99)
Glucose-Capillary: 125 mg/dL — ABNORMAL HIGH (ref 70–99)

## 2024-05-08 LAB — AMMONIA: Ammonia: 31 umol/L (ref 9–35)

## 2024-05-08 LAB — CORTISOL-AM, BLOOD: Cortisol - AM: 62.9 ug/dL — ABNORMAL HIGH (ref 6.7–22.6)

## 2024-05-08 MED ORDER — IPRATROPIUM-ALBUTEROL 0.5-2.5 (3) MG/3ML IN SOLN
3.0000 mL | RESPIRATORY_TRACT | Status: DC
  Administered 2024-05-08: 3 mL via RESPIRATORY_TRACT

## 2024-05-08 MED ORDER — SODIUM CHLORIDE 0.9% FLUSH
10.0000 mL | Freq: Two times a day (BID) | INTRAVENOUS | Status: DC
  Administered 2024-05-08 – 2024-05-09 (×4): 10 mL
  Administered 2024-05-09: 20 mL
  Administered 2024-05-10: 10 mL
  Administered 2024-05-10: 30 mL
  Administered 2024-05-10: 10 mL
  Administered 2024-05-10 – 2024-05-11 (×3): 30 mL
  Administered 2024-05-11 – 2024-05-12 (×6): 10 mL
  Administered 2024-05-13: 30 mL

## 2024-05-08 MED ORDER — SODIUM CHLORIDE 0.9% FLUSH: 10.0000 mL | INTRAVENOUS | Status: DC | PRN

## 2024-05-08 MED ORDER — IPRATROPIUM-ALBUTEROL 0.5-2.5 (3) MG/3ML IN SOLN
3.0000 mL | Freq: Four times a day (QID) | RESPIRATORY_TRACT | Status: DC
  Administered 2024-05-08 (×2): 3 mL via RESPIRATORY_TRACT
  Filled 2024-05-08 (×2): qty 3

## 2024-05-08 MED ORDER — VALPROATE SODIUM 100 MG/ML IV SOLN
500.0000 mg | Freq: Four times a day (QID) | INTRAVENOUS | Status: DC
  Administered 2024-05-08 – 2024-05-10 (×11): 500 mg via INTRAVENOUS
  Filled 2024-05-08 (×10): qty 5

## 2024-05-08 MED ORDER — LACTATED RINGERS IV SOLN: INTRAVENOUS | Status: DC

## 2024-05-08 MED ORDER — CHLORHEXIDINE GLUCONATE CLOTH 2 % EX PADS
6.0000 | MEDICATED_PAD | Freq: Every day | CUTANEOUS | Status: DC
  Administered 2024-05-08 – 2024-05-13 (×9): 6 via TOPICAL

## 2024-05-08 MED ORDER — ORAL CARE MOUTH RINSE: 15.0000 mL | OROMUCOSAL | Status: DC | PRN

## 2024-05-08 MED ORDER — HYDROCORTISONE SOD SUC (PF) 100 MG IJ SOLR
50.0000 mg | Freq: Three times a day (TID) | INTRAMUSCULAR | Status: DC
  Administered 2024-05-08 – 2024-05-13 (×25): 50 mg via INTRAVENOUS
  Filled 2024-05-08 (×16): qty 2

## 2024-05-08 MED ORDER — NOREPINEPHRINE 4 MG/250ML-% IV SOLN
0.0000 ug/min | INTRAVENOUS | Status: DC
  Administered 2024-05-08: 7 ug/min via INTRAVENOUS
  Administered 2024-05-08: 8 ug/min via INTRAVENOUS
  Administered 2024-05-08: 7 ug/min via INTRAVENOUS
  Administered 2024-05-09: 5 ug/min via INTRAVENOUS
  Administered 2024-05-09: 4 ug/min via INTRAVENOUS
  Filled 2024-05-08 (×4): qty 250

## 2024-05-08 MED ORDER — LACTATED RINGERS IV SOLN: INTRAVENOUS | Status: AC

## 2024-05-08 MED ORDER — ORAL CARE MOUTH RINSE
15.0000 mL | OROMUCOSAL | Status: DC
  Administered 2024-05-08 – 2024-05-13 (×32): 15 mL via OROMUCOSAL

## 2024-05-08 MED ORDER — SODIUM CHLORIDE 0.9 % IV SOLN
2.0000 g | INTRAVENOUS | Status: DC
  Administered 2024-05-08: 2 g via INTRAVENOUS
  Filled 2024-05-08 (×2): qty 12.5

## 2024-05-08 NOTE — Progress Notes (Signed)
 .    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   On chart review, patient was recently discharged from the hospital on 04/28/24 following admission for AMS and multifactorial shock iso suspected polypharmacy, adrenal insufficiency vs autonomic dysfunction. Per ED reports, patient's home health nurse called EMS for altered mental status. On EMS arrival, patient was lethargic and able to answer only yes or no questions. Initial EMS BP 80/50, temp 101.5 axillary, HR 110, 96O2 on RA. 1000mg  tylenol  IV, 500NS bolus given in route.   ED Course: Initial vital signs showedBP 93/60  Pulse 100   Temp 100 F (37.8 C) (Oral)   Resp (!) 22  SpO2 (!) 88%, O2 @2L . Pertinent Labs/Diagnostics Findings: Na+/ K+:143/2.8 Glucose:85 BUN/Cr.:40/2.71 WBC:4.3 K/L Hgb/Hct:10.4/32.9 Plts: 76 PCT:16.12 Lactic acid:2.6  UA: rare bacteria COVID PCR: Negative,  VBG: pO2 pend; pCO2 52; pH 7.36;HCO3 29.4, %O2 Sat pend.  CXR> RUL pneumonia Patient given 30 cc/kg of fluids and started on broad-spectrum antibiotics Ceftriaxone  and Azityhromycin for sepsis with septic shock iso pneumonia. Patient admitted to [facility] service. However patient remained hypotensive despite IVF boluses therefore was started on Levophed . PCCM consulted.  Past Medical History  Arthritis  Type II Diabetes Mellitus  Emphysema/COPD  Epilepsy HLD HTN CKD Stage IV Lewy Body Dementia  Lumbar Stenosis  Spinal Meningitis  CSF Shunt  Seizure Tremors in both Hands  Significant Hospital Events   05/07/24:Admitted to [facility] service with sepsis  and acute toxic metabolic encephalopathy 2/2 pneumonia, developed recurrent hypotension requiring levophed  gtt. PCCM consulted  8/9 remains on biPAP, severe encephalopathy FiO2 (%):  [35 %-100 %] 35 %   Consults:  PCCM  Procedures:  8/8: Left internal jugular cvc  Interim History / Subjective:    Remains critically ill DNR/DNI Severe hypoxia High risk for cardiac arrest and death  Micro Data:  8/8: SARS-CoV-2 PCR> negative 8/8: Influenza PCR> negative 8/8: Blood culture x2> 8/8: MRSA PCR>>  8/8: Strep pneumo urinary antigen> 8/8: Legionella urinary antigen>  Antimicrobials:  Azithromycin  8/8>> Ceftriaxone  8/8>>  OBJECTIVE  Blood pressure 93/67, pulse 89, temperature 98 F (36.7 C), temperature source Oral, resp. rate 18, height 5' 11 (1.803 m), weight 75.7 kg, SpO2 99%.    FiO2 (%):  [35 %-100 %] 35 %   Intake/Output Summary (Last 24 hours) at 05/08/2024 0806 Last data filed at 05/08/2024 0730 Gross per 24 hour  Intake 1123.72 ml  Output 125 ml  Net 998.72 ml   Filed Weights   05/07/24 1430  Weight: 75.7 kg      REVIEW OF SYSTEMS  PATIENT IS UNABLE TO PROVIDE COMPLETE REVIEW OF SYSTEMS DUE TO SEVERE CRITICAL ILLNESS   PHYSICAL EXAMINATION:  GENERAL:critically ill appearing, +resp distress EYES: Pupils equal, round, reactive to light.  No scleral icterus.  MOUTH: Moist mucosal membrane. NECK: Supple.  PULMONARY: Lungs clear to auscultation, +rhonchi CARDIOVASCULAR: S1 and S2.  Regular rate and rhythm GASTROINTESTINAL: Soft, nontender, -distended. Positive bowel sounds.  MUSCULOSKELETAL:  edema.  NEUROLOGIC: obtunded SKIN:normal, warm to touch, Capillary refill delayed  Pulses present bilaterally  Labs/imaging that I havepersonally reviewed  (right click and Reselect all SmartList Selections daily)    DG Chest 1 View Result Date: 05/07/2024 CLINICAL DATA:  Central venous catheter placement EXAM: CHEST  1 VIEW COMPARISON:  05/07/2024 FINDINGS: Left  internal jugular central venous catheter tip overlies the expected superior vena cava. Progressive consolidation within the right upper lobe. Lung volumes are small, but stable since prior examination. No pneumothorax or pleural effusion. Cardiac size within normal limits. Right vascularity is normal. Ventriculoperitoneal shunt catheter tubing overlies the right hemithorax. No acute bone abnormality. IMPRESSION: 1. Left internal jugular central venous catheter tip within the superior vena cava. No pneumothorax. 2. Progressive right upper lobe consolidation. Follow-up chest radiograph is recommended in 3-4 weeks, following conservative therapy, to document resolution. If persistent at that time, dedicated contrast enhanced CT imaging of the chest is recommended for further evaluation. Electronically Signed   By: Albert Figueroa M.D.   On: 05/07/2024 23:36   DG Chest Port 1 View Result Date: 05/07/2024 CLINICAL DATA:  Shortness of breath EXAM: PORTABLE CHEST 1 VIEW COMPARISON:  Chest x-ray 05/07/2024 FINDINGS: Dense focal airspace opacity in the right upper lobe has increased in size and density. Left lung is clear. Costophrenic angles are clear. No pneumothorax. Cardiomediastinal silhouette is within normal limits. Osseous structures are stable. Catheter overlies the right chest, unchanged. IMPRESSION: Increasing focal airspace opacity in the right upper lobe. Findings are concerning for pneumonia. Follow-up imaging recommended to confirm complete resolution. Electronically Signed   By: Albert Figueroa M.D.   On: 05/07/2024 19:19   DG Chest Port 1 View Result Date: 05/07/2024 CLINICAL DATA:  Questionable sepsis.  Altered mental status. EXAM: PORTABLE CHEST 1 VIEW COMPARISON:  04/17/2024 FINDINGS: Catheter over the right neck, chest and abdomen likely ventriculostomy catheter unchanged. Lungs are adequately inflated with slight elevation of the left hemidiaphragm. Mild hazy opacification over the right mid to upper  lung which may be due to atelectasis or infection. No effusion. Cardiomediastinal silhouette and remainder of the exam is unchanged. IMPRESSION: Mild hazy opacification over the right mid to upper lung which may be due to atelectasis or infection. Electronically Signed   By: Toribio Agreste M.D.   On: 05/07/2024 15:04    Labs   CBC: Recent Labs  Lab 05/07/24 1438 05/08/24 0437  WBC 4.3 3.9*  NEUTROABS 3.3  --   HGB 10.4* 8.6*  HCT 32.9* 26.0*  MCV 102.5* 100.8*  PLT 76* 71*   Basic Metabolic Panel: Recent Labs  Lab 05/07/24 1430 05/07/24 1438 05/08/24 0437  NA  --  143 142  K  --  2.8* 3.5  CL  --  105 108  CO2  --  24 22  GLUCOSE  --  85 113*  BUN  --  40* 45*  CREATININE  --  2.71* 2.64*  CALCIUM   --  8.6* 7.8*  MG 1.7  --   --    GFR: Estimated Creatinine Clearance: 28.9 mL/min (A) (by C-G formula based on SCr of 2.64 mg/dL (H)). Recent Labs  Lab 05/07/24 1438 05/07/24 1650 05/07/24 2015 05/08/24 0437  PROCALCITON  --   --  16.12  --   WBC 4.3  --   --  3.9*  LATICACIDVEN 2.6* 2.2*  --   --    Liver Function Tests: Recent Labs  Lab 05/07/24 1438  AST 24  ALT 10  ALKPHOS 63  BILITOT 1.3*  PROT 7.4  ALBUMIN 2.5*   Recent Labs  Lab 05/07/24 1438  LIPASE 22   No results for input(s): AMMONIA in the last 168 hours.  ABG    Component Value Date/Time   HCO3 29.4 (H) 05/07/2024 1848   O2SAT PENDING 05/07/2024 1848   Coagulation Profile: Recent Labs  Lab 05/07/24 1438  INR 1.3*   Cardiac Enzymes: No results for input(s): CKTOTAL, CKMB, CKMBINDEX, TROPONINI in the last 168 hours.  HbA1C: Hgb A1c MFr Bld  Date/Time Value Ref Range Status  01/16/2024 05:28 AM 4.2 (L) 4.8 - 5.6 % Final    Comment:    (NOTE) Pre diabetes:          5.7%-6.4%  Diabetes:              >6.4%  Glycemic control for   <7.0% adults with diabetes   09/01/2022 06:41 PM 5.0 4.8 - 5.6 % Final    Comment:    (NOTE)         Prediabetes: 5.7 - 6.4          Diabetes: >6.4         Glycemic control for adults with diabetes: <7.0     CBG: Recent Labs  Lab 05/07/24 2153 05/08/24 0722  GLUCAP 83 109*   Review of Systems:   Unable to be obtained secondary to the patient's altered mental status.   Past Medical History  He,  has a past medical history of Arthritis, Diabetes mellitus without complication (HCC), Emphysema/COPD (HCC), Epilepsy (HCC), History of brain shunt, Hyperlipidemia, Hypertension, Kidney disease, Lewy body dementia (HCC), Lumbar stenosis, Meningitis spinal, Osteoarthrosis, Seizure (HCC), Stage 4 chronic kidney disease (HCC), and Tremor of both hands.   Surgical History    Past Surgical History:  Procedure Laterality Date   CATARACT EXTRACTION W/PHACO Right 05/14/2023   Procedure: CATARACT EXTRACTION PHACO AND INTRAOCULAR LENS PLACEMENT (IOC) RIGHT DIABETIC;  Surgeon: Mittie Gaskin, MD;  Location: [facility];  Service: Ophthalmology;  Laterality: Right;  6.70 0:39.8   CATARACT EXTRACTION W/PHACO Left 05/28/2023   Procedure: CATARACT EXTRACTION PHACO AND INTRAOCULAR LENS PLACEMENT (IOC) LEFT DIABETIC 6.34 00:37.1;  Surgeon: Mittie Gaskin, MD;  Location: [facility];  Service: Ophthalmology;  Laterality: Left;   CSF SHUNT     placed in 1980s   KNEE SURGERY     multiple surgeries on both knees     Social History   reports that he has been smoking cigarettes. He has a 96 pack-year smoking history. He has never used smokeless tobacco. He reports that he does not drink alcohol and does not use drugs.   Family History   His family history includes CAD in his father; Cancer in his mother; Diabetes in his mother.   Allergies Allergies  Allergen Reactions   Aricept  [Donepezil ] Other (See Comments)    SEVERE BRADYCARDIA WITH HEART BLOCK DO NOT RESTART AT DISCHARGE   Gabapentin  Other (See Comments)    Lethargic, Confused   Amlodipine     Other reaction(s): Other (see comments) Caused elevated  blood pressure and headaches   Ozempic (0.25 Or 0.5 Mg-Dose) [Semaglutide(0.25 Or 0.5mg -Dos)]     Lethargy.  Also dropped blood sugars too low   Home Medications  Prior to Admission medications   Medication Sig Start Date End Date Taking? Authorizing Provider  acetaminophen  (TYLENOL ) 325 MG tablet Take 2 tablets (650 mg total) by mouth every 6 (six) hours as needed for mild pain (or Fever >/= 101). 09/07/22   Krishnan, Sendil K, MD  albuterol  (VENTOLIN  HFA) 108 (  90 Base) MCG/ACT inhaler Inhale into the lungs every 6 (six) hours as needed for wheezing or shortness of breath.    [provider]  cyclobenzaprine  (FEXMID ) 7.5 MG tablet Take 1 tablet (7.5 mg total) by mouth 3 (three) times daily as needed for muscle spasms. Home med. 04/28/24   Awanda City, MD  dexlansoprazole (DEXILANT) 60 MG capsule Take 60 mg by mouth daily.    [provider]  divalproex  (DEPAKOTE  ER) 500 MG 24 hr tablet Take 1,000 mg by mouth in the morning and at bedtime.    [provider]  donepezil  (ARICEPT  ODT) 10 MG disintegrating tablet Take 10 mg by mouth at bedtime.    [provider]  feeding supplement (ENSURE PLUS HIGH PROTEIN) LIQD Take 237 mLs by mouth 3 (three) times daily between meals. 04/28/24   Awanda City, MD  fenofibrate  160 MG tablet Take 160 mg by mouth daily. 04/25/22   [provider]  ferrous sulfate  325 (65 FE) MG tablet Take 325 mg by mouth daily with breakfast.    [provider]  Fluticasone -Umeclidin-Vilant (TRELEGY ELLIPTA) 100-62.5-25 MCG/ACT AEPB Inhale into the lungs daily.    [provider]  hydrOXYzine  (ATARAX ) 10 MG tablet Take 10 mg by mouth 3 (three) times daily as needed.    [provider]  lidocaine  (LIDODERM ) 5 % Place 1 patch onto the skin daily. 04/05/24   [provider]  midodrine  (PROAMATINE ) 2.5 MG tablet Take 1 tablet (2.5 mg total) by mouth 3 (three) times daily with meals. 04/28/24   Awanda City, MD  Multiple  Vitamin (MULTIVITAMIN WITH MINERALS) TABS tablet Take 1 tablet by mouth daily. 04/29/24   Awanda City, MD  naloxone  (NARCAN ) nasal spray 4 mg/0.1 mL Place 1 spray into the nose once as needed for up to 1 dose. 04/28/24   Awanda City, MD  Oxycodone  HCl 20 MG TABS Take 0.5 tablets (10 mg total) by mouth every 4 (four) hours as needed. Home med.  Reduced from 20 mg. 04/28/24   Awanda City, MD  polyethylene glycol (MIRALAX  / GLYCOLAX ) 17 g packet Take 17 g by mouth daily. 04/28/24   Awanda City, MD  rosuvastatin  (CRESTOR ) 20 MG tablet Take 20 mg by mouth at bedtime. 04/08/22   [provider]  Scheduled Meds:  budesonide -glycopyrrolate -formoterol   2 puff Inhalation BID   Chlorhexidine  Gluconate Cloth  6 each Topical Daily   feeding supplement  237 mL Oral TID BM   ferrous sulfate   325 mg Oral Q breakfast   hydrocortisone  sod succinate (SOLU-CORTEF ) inj  50 mg Intravenous Q8H   insulin  aspart  0-5 Units Subcutaneous QHS   insulin  aspart  0-9 Units Subcutaneous TID WC   midodrine   2.5 mg Oral TID WC   pantoprazole   40 mg Oral Daily   rosuvastatin   20 mg Oral QHS   sodium chloride  flush  10-40 mL Intracatheter Q12H   vancomycin  variable dose per unstable renal function (pharmacist dosing)   Does not apply See admin instructions   Continuous Infusions:  sodium chloride      azithromycin      cefTRIAXone  (ROCEPHIN )  IV     lactated ringers  Stopped (05/07/24 1842)   norepinephrine  (LEVOPHED ) Adult infusion 7 mcg/min (05/08/24 0743)   valproate sodium  500 mg (05/08/24 0742)   vasopressin  0.04 Units/min (05/08/24 0509)   PRN Meds:.acetaminophen  **OR** acetaminophen , cyclobenzaprine , diazepam , hydrOXYzine , ipratropium-albuterol , nicotine , ondansetron  **OR** ondansetron  (ZOFRAN ) IV, senna-docusate, sodium chloride  flush   Assessment & Plan:   68  yo white male with severe acute hypoxic resp failure due to COPD exacerbation and severe RT sided pneumonia  Severe ACUTE Hypoxic and Hypercapnic Respiratory  Failure - Intermittent chest x-ray & ABG PRN - Ensure adequate pulmonary hygiene  biPAP as needed Wean fio2 as tolerated  SEVERE COPD EXACERBATION -continue IV steroids as prescribed -continue NEB THERAPY as prescribed -wean fio2 as needed and tolerated  SEPTIC shock SOURCE-pneumonia -use vasopressors to keep MAP>65 as needed -follow ABG and LA as needed -follow up cultures -emperic ABX -consider stress dose steroids -aggressive IV fluid Resuscitation   CARDIAC -Echocardiogram 04/17/24: LVEF 60-65%, grade I DD, RV systolic function is normal, RV size normal -Vasopressors as needed to maintain MAP goal  -Continue Midodrine  10 mg TID -start Stress Dose Steroids -trend lactate -HS Troponin is normal  Acute  Metabolic Encephalopathy  Seizure disorder Chronic pain  Recurrent falls  Hx: Lewy body dementia, meningitis s/p VP shunt, and epilepsy  -Consider CT head if no improvement in mental status -seizure precautions -Continue Depakote  High risk for aspiration    ACUTE KIDNEY INJURY/Renal Failure -continue Foley Catheter-assess need -Avoid nephrotoxic agents -Follow urine output, BMP -Ensure adequate renal perfusion, optimize oxygenation -Renal dose medications   Intake/Output Summary (Last 24 hours) at 05/08/2024 0813 Last data filed at 05/08/2024 0730 Gross per 24 hour  Intake 1123.72 ml  Output 125 ml  Net 998.72 ml      Latest Ref Rng & Units 05/08/2024    4:37 AM 05/07/2024    2:38 PM 04/27/2024    3:39 AM  BMP  Glucose 70 - 99 mg/dL 886  85  92   BUN 8 - 23 mg/dL 45  40  50   Creatinine 0.61 - 1.24 mg/dL 7.35  7.28  8.04   Sodium 135 - 145 mmol/L 142  143  141   Potassium 3.5 - 5.1 mmol/L 3.5  2.8  4.5   Chloride 98 - 111 mmol/L 108  105  111   CO2 22 - 32 mmol/L 22  24  25    Calcium  8.9 - 10.3 mg/dL 7.8  8.6  8.3       ENDO - ICU hypoglycemic\Hyperglycemia protocol -check FSBS per protocol   GI GI PROPHYLAXIS as indicated NUTRITIONAL  STATUS DIET-->NPO Constipation protocol as indicated   ELECTROLYTES -follow labs as needed -replace as needed -pharmacy consultation and following  RESTRICTIVE TRANSFUSION PROTOCOL TRANSFUSION  IF HGB<7  or ACTIVE BLEEDING OR DX of ACUTE CORONARY SYNDROMES     Best practice:  Diet:  NPO Pain/Anxiety/Delirium protocol (if indicated): No VAP protocol (if indicated): Not indicated DVT prophylaxis: LMWH GI prophylaxis: PPI Glucose control:  SSI Yes Central venous access:  Yes, and it is still needed Arterial line:  N/A Foley:  Yes, and it is still needed Mobility:  bed rest  PT consulted: N/A Last date of multidisciplinary goals of care discussion []  Code Status:  DNR Disposition: ICU   = Goals of Care = Code Status Order: DNR  Primary Emergency Contact: Lynn,[name] Patient wishes to pursue ongoing treatment, but concurred that if deteriorated to pulselessness, patient would prefer a natural death as opposed to invasive measures such as CPR and intubation.  Family should be immediately contacted in such situations.     DVT/GI PRX  assessed I Assessed the need for Labs I Assessed the need for Foley I Assessed the need for Central Venous Line Family Discussion when available I Assessed the need for Mobilization I made an Assessment of  medications to be adjusted accordingly Safety Risk assessment completed  CASE DISCUSSED IN MULTIDISCIPLINARY ROUNDS WITH ICU TEAM     Critical Care Time devoted to patient care services described in this note is 65 minutes.  Critical care was necessary to treat /prevent imminent and life-threatening deterioration. Overall, patient is critically ill, prognosis is guarded.  Patient with Multiorgan failure and at high risk for cardiac arrest and death.    Nickolas Alm Cellar, M.D.  [facility]  Medical Director [facility] Medical Director [facility] Cardio-Pulmonary Department

## 2024-05-08 NOTE — Plan of Care (Signed)
  Problem: Fluid Volume: Goal: Hemodynamic stability will improve Outcome: Progressing   Problem: Clinical Measurements: Goal: Diagnostic test results will improve Outcome: Progressing   Problem: Tissue Perfusion: Goal: Adequacy of tissue perfusion will improve Outcome: Progressing

## 2024-05-08 NOTE — Procedures (Signed)
 CENTRAL VENOUS CATHETER INSERTION PROCEDURE NOTE  JAYZON TARAS  [id number]  August 29, 1956  Date:05/08/24  Time:5:42 AM   Provider Performing:Shakeela Rabadan A Tiane Szydlowski   Procedure: Insertion of Non-tunneled Central Venous Catheter(36556) with US  guidance (23062)   Indication(s) Medication administration and Difficult access  Consent Unable to obtain consent due to emergent nature of procedure.  Anesthesia Topical only with 1% lidocaine    Timeout Verified patient identification, verified procedure, site/side was marked, verified correct patient position, special equipment/implants available, medications/allergies/relevant history reviewed, required imaging and test results available.  Sterile Technique Maximal sterile technique including full sterile barrier drape, hand hygiene, sterile gown, sterile gloves, mask, hair covering, sterile ultrasound probe cover (if used).  Procedure Description Area of catheter insertion was cleaned with chlorhexidine  and draped in sterile fashion.  With real-time ultrasound guidance a central venous catheter was placed into the left internal jugular vein. Nonpulsatile blood flow and easy flushing noted in all ports.  The catheter was sutured in place and sterile dressing applied.  Complications/Tolerance None; patient tolerated the procedure well. Chest X-ray is ordered to verify placement for internal jugular or subclavian cannulation.   Chest x-ray is not ordered for femoral cannulation.  EBL Minimal  Specimen(s) None   [name], DNP, CCRN, FNP-C, AGACNP-BC Acute Care & Family Nurse Practitioner  [facility]  PCCM on call pager [phone] until 7 am

## 2024-05-08 NOTE — Consult Note (Signed)
 Consultation Note Date: 05/08/2024   Patient Name: Albert Figueroa  DOB: 06-13-1956  MRN: [id number]  Age / Sex: 68 y.o., male  PCP: Albert Amis, MD Referring Physician: Isaiah Scrivener, MD  Reason for Consultation: Establishing goals of care   HPI/Brief Hospital Course: 68 y.o. 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  Reportedly found by his home health nurse altered which prompted EMS to be called.  On arrival to the ED, Mr. Albert Figueroa was lethargic.  Found to have sepsis and acute toxic metabolic encephalopathy secondary to significant right sided pneumonia, became hypoxic.  Requiring BiPAP and vasopressor support.  Noted 6 inpatient admissions within the last 6 months  Last discharge 7/30, admission due to altered mental status and multifactorial shock, possible polypharmacy as well as adrenal insufficiency versus autonomic dysfunction  Palliative medicine was consulted for assisting with goals of care conversations.  Subjective:  Extensive chart review has been completed prior to meeting patient including labs, vital signs, imaging, progress notes, orders, and available advanced directive documents from current and previous encounters.  Visited with Mr. Albert Figueroa at his bedside.  He remains on BiPAP, he is able to open his eyes to calling of his name and follow single step simple commands but unable to remain coherent or follow multistep commands.  No family or visitors at bedside during time of visit.  Called and spoke with daughter Albert Figueroa and informed her Mr. Albert Figueroa hide return to the hospital for which she was unaware.  Introduced myself as a Albert Figueroa as a member of the palliative care team. Explained palliative medicine is specialized medical care  for people living with serious illness. It focuses on providing relief from the symptoms and stress of a serious illness. The goal is to improve quality of life for both the patient and the family.   Explained to [place] Albert Figueroa home health nurse came for a visit yesterday and found him altered prompting her to call 911.  He was brought into the hospital and being treated for pneumonia and shock requiring vasopressor and BiPAP support.  He remains quite lethargic and unable to participate in conversations.  Albert Figueroa shares Mr. Albert Figueroa lives at home alone.  He was set up with home health recently when he was discharged from [facility] for short-term rehab after recent hospital admission.  Albert Figueroa shares he was only at [facility] for about a week prior to discharge.  She expresses her concern is that she shared with the team at [facility] related to his returning home as she felt he was not safe to be at home alone.  Albert Figueroa shares that she also has a brother Albert Figueroa who was involved in [name] care.  Albert Figueroa shares that she lives about 30 minutes away while her brother Albert Figueroa lives just around the corner for Mr. Albert Figueroa.  Albert Figueroa shares her concern related to [name] functional and cognitive decline over the last several months.  She shares she has noticed his memory impairment has also significantly declined since his last hospital admission.  We discussed patient's current illness and what it means in the larger context of patient's on-going co-morbidities. Natural disease trajectory and expectations at EOL were discussed.  Discussed the possibility of Mr. Albert Figueroa progressing rapidly due to recurrent hospitalizations and recurrent acute  illnesses.  Albert Figueroa confirms Mr. Albert Figueroa was clear several months ago his wish for DNR/DNI status.  Albert Figueroa shares she and her brother transport Mr. Albert Figueroa to and from his doctors appointments as he has not been able to drive for the  last 8-8/7 to 2 years.  They also provide him with frozen meals as he is unable to prepare cooked meals for himself.  Albert Figueroa voices her concern for Mr. Albert Figueroa ability to return home living independently.  Albert Figueroa shares she will reach out to her brother Albert Figueroa and they will set up a time to meet with PMT provider that works for both of them hopefully over the weekend to further discuss goals of care.  I discussed importance of continued conversations with family/support persons and all members of their medical team regarding overall plan of care and treatment options ensuring decisions are in alignment with patients goals of care.  All questions/concerns addressed. Emotional support provided to patient/family/support persons. PMT will continue to follow and support patient as needed.  Objective: Primary Diagnoses: Present on Admission:  Severe sepsis with acute organ dysfunction (HCC)  Type 2 diabetes mellitus with chronic kidney disease, without long-term current use of insulin  (HCC)  Tobacco use  Type 2 diabetes mellitus with renal complication (HCC)  Hyperlipidemia, unspecified   Physical Exam Constitutional:      General: He is not in acute distress.    Appearance: He is ill-appearing.  Pulmonary:     Effort: Pulmonary effort is normal. No respiratory distress.  Skin:    General: Skin is warm and dry.  Neurological:     Mental Status: He is lethargic.    Vital Signs: BP (!) 92/59   Pulse 84   Temp 97.9 F (36.6 C) (Axillary)   Resp 16   Ht 5' 11 (1.803 m)   Wt 75.7 kg   SpO2 96%   BMI 23.28 kg/m  Pain Scale: CPOT   Pain Score: 0-No pain  IO: Intake/output summary:  Intake/Output Summary (Last 24 hours) at 05/08/2024 1633 Last data filed at 05/08/2024 1557 Gross per 24 hour  Intake 2213.08 ml  Output 285 ml  Net 1928.08 ml    LBM: Last BM Date : 05/08/24 Baseline Weight: Weight: 75.7 kg Most recent weight: Weight: 75.7 kg      Assessment and Plan  SUMMARY  OF RECOMMENDATIONS   Continue current plan of care Continue goals of care discussions once family able to meet PMT to continue to follow for ongoing needs and support  Palliative Prophylaxis:   Bowel Regimen, Delirium Protocol and Frequent Pain Assessment  Discussed With: ICU team and nursing staff   Thank you for this consult and allowing Palliative Medicine to participate in the care of Nancyann MICAEL Leigh [name]. Palliative medicine will continue to follow and assist as needed.   Time Total: 75 minutes  Time spent includes: Detailed review of medical records (labs, imaging, vital signs), medically appropriate exam (mental status, respiratory, cardiac, skin), discussed with treatment team, counseling and educating patient, family and staff, documenting clinical information, medication management and coordination of care.   Signed by: Waddell Lesches, DNP, AGNP-C Palliative Medicine    Please contact Palliative Medicine Team phone at [phone] for questions and concerns.  For individual provider: See Tracey

## 2024-05-08 NOTE — Consult Note (Signed)
 Pharmacy Antibiotic Note  Albert Figueroa is a 68 y.o. male admitted on 05/07/2024 with sepsis / pneumonia.  Pharmacy has been consulted for vancomycin  dosing.  Plan:  Vancomycin  1.75 g IV loading dose given yesterday. Will check a random level tomorrow AM to guide further dosing  Height: 5' 11 (180.3 cm) Weight: 75.7 kg (166 lb 14.2 oz) IBW/kg (Calculated) : 75.3  Temp (24hrs), Avg:99.4 F (37.4 C), Min:99 F (37.2 C), Max:100 F (37.8 C)  Recent Labs  Lab 05/07/24 1438 05/07/24 1650 05/08/24 0437  WBC 4.3  --  3.9*  CREATININE 2.71*  --  2.64*  LATICACIDVEN 2.6* 2.2*  --     Estimated Creatinine Clearance: 28.9 mL/min (A) (by C-G formula based on SCr of 2.64 mg/dL (H)).    Allergies  Allergen Reactions   Aricept  [Donepezil ] Other (See Comments)    SEVERE BRADYCARDIA WITH HEART BLOCK DO NOT RESTART AT DISCHARGE   Gabapentin  Other (See Comments)    Lethargic, Confused   Amlodipine     Other reaction(s): Other (see comments) Caused elevated blood pressure and headaches   Ozempic (0.25 Or 0.5 Mg-Dose) [Semaglutide(0.25 Or 0.5mg -Dos)]     Lethargy.  Also dropped blood sugars too low    Antimicrobials this admission: Azithromycin  8/8 >>  Ceftriaxone  8/8 >>  Vancomycin  8/8 >>   Dose adjustments this admission: N/A  Microbiology results: 8/8 BCx: NGTD 8/8 MRSA PCR: (-)  Thank you for allowing pharmacy to be a part of this patient's care.  Marolyn KATHEE Mare 05/08/2024 7:07 AM

## 2024-05-09 ENCOUNTER — Inpatient Hospital Stay

## 2024-05-09 DIAGNOSIS — J189 Pneumonia, unspecified organism: Secondary | ICD-10-CM | POA: Diagnosis not present

## 2024-05-09 DIAGNOSIS — R001 Bradycardia, unspecified: Secondary | ICD-10-CM

## 2024-05-09 DIAGNOSIS — Z515 Encounter for palliative care: Secondary | ICD-10-CM | POA: Diagnosis not present

## 2024-05-09 DIAGNOSIS — R6521 Severe sepsis with septic shock: Secondary | ICD-10-CM | POA: Diagnosis not present

## 2024-05-09 DIAGNOSIS — N179 Acute kidney failure, unspecified: Secondary | ICD-10-CM | POA: Diagnosis not present

## 2024-05-09 DIAGNOSIS — A419 Sepsis, unspecified organism: Secondary | ICD-10-CM | POA: Diagnosis not present

## 2024-05-09 DIAGNOSIS — J9601 Acute respiratory failure with hypoxia: Secondary | ICD-10-CM | POA: Diagnosis not present

## 2024-05-09 DIAGNOSIS — J441 Chronic obstructive pulmonary disease with (acute) exacerbation: Secondary | ICD-10-CM | POA: Diagnosis not present

## 2024-05-09 DIAGNOSIS — R6520 Severe sepsis without septic shock: Secondary | ICD-10-CM | POA: Diagnosis not present

## 2024-05-09 LAB — BASIC METABOLIC PANEL WITH GFR
Anion gap: 13 (ref 5–15)
BUN: 50 mg/dL — ABNORMAL HIGH (ref 8–23)
CO2: 23 mmol/L (ref 22–32)
Calcium: 7.8 mg/dL — ABNORMAL LOW (ref 8.9–10.3)
Chloride: 105 mmol/L (ref 98–111)
Creatinine, Ser: 2.26 mg/dL — ABNORMAL HIGH (ref 0.61–1.24)
GFR, Estimated: 31 mL/min — ABNORMAL LOW (ref >60)
Glucose, Bld: 129 mg/dL — ABNORMAL HIGH (ref 70–99)
Potassium: 3.8 mmol/L (ref 3.5–5.1)
Sodium: 141 mmol/L (ref 135–145)

## 2024-05-09 LAB — GLUCOSE, CAPILLARY
Glucose-Capillary: 143 mg/dL — ABNORMAL HIGH (ref 70–99)
Glucose-Capillary: 120 mg/dL — ABNORMAL HIGH (ref 70–99)
Glucose-Capillary: 129 mg/dL — ABNORMAL HIGH (ref 70–99)
Glucose-Capillary: 170 mg/dL — ABNORMAL HIGH (ref 70–99)
Glucose-Capillary: 150 mg/dL — ABNORMAL HIGH (ref 70–99)

## 2024-05-09 LAB — VANCOMYCIN, RANDOM: Vancomycin Rm: 9 ug/mL

## 2024-05-09 LAB — PROCALCITONIN: Procalcitonin: 37.16 ng/mL

## 2024-05-09 LAB — MAGNESIUM: Magnesium: 1.6 mg/dL — ABNORMAL LOW (ref 1.7–2.4)

## 2024-05-09 MED ORDER — ATROPINE SULFATE 1 MG/10ML IJ SOSY
PREFILLED_SYRINGE | INTRAMUSCULAR | Status: AC
  Filled 2024-05-09: qty 10

## 2024-05-09 MED ORDER — SODIUM CHLORIDE 0.9 % IV SOLN
2.0000 g | Freq: Two times a day (BID) | INTRAVENOUS | Status: DC
  Administered 2024-05-09 – 2024-05-10 (×4): 2 g via INTRAVENOUS
  Filled 2024-05-09 (×3): qty 12.5

## 2024-05-09 MED ORDER — IPRATROPIUM-ALBUTEROL 0.5-2.5 (3) MG/3ML IN SOLN: 3.0000 mL | Freq: Two times a day (BID) | RESPIRATORY_TRACT | Status: DC

## 2024-05-09 MED ORDER — LACTATED RINGERS IV BOLUS
250.0000 mL | Freq: Once | INTRAVENOUS | Status: AC
  Administered 2024-05-09: 250 mL via INTRAVENOUS

## 2024-05-09 MED ORDER — IPRATROPIUM-ALBUTEROL 0.5-2.5 (3) MG/3ML IN SOLN
3.0000 mL | Freq: Two times a day (BID) | RESPIRATORY_TRACT | Status: DC
  Administered 2024-05-09 – 2024-05-12 (×12): 3 mL via RESPIRATORY_TRACT
  Filled 2024-05-09 (×6): qty 3

## 2024-05-09 MED ORDER — MAGNESIUM SULFATE 2 GM/50ML IV SOLN
2.0000 g | Freq: Once | INTRAVENOUS | Status: AC
  Administered 2024-05-09: 2 g via INTRAVENOUS
  Filled 2024-05-09: qty 50

## 2024-05-09 NOTE — Plan of Care (Signed)
  Problem: Fluid Volume: Goal: Hemodynamic stability will improve Outcome: Progressing   Problem: Clinical Measurements: Goal: Diagnostic test results will improve Outcome: Progressing Goal: Signs and symptoms of infection will decrease Outcome: Progressing   Problem: Respiratory: Goal: Ability to maintain adequate ventilation will improve Outcome: Progressing   Problem: Education: Goal: Ability to describe self-care measures that may prevent or decrease complications (Diabetes Survival Skills Education) will improve Outcome: Progressing   Problem: Coping: Goal: Ability to adjust to condition or change in health will improve Outcome: Progressing   Problem: Fluid Volume: Goal: Ability to maintain a balanced intake and output will improve Outcome: Progressing   Problem: Health Behavior/Discharge Planning: Goal: Ability to identify and utilize available resources and services will improve Outcome: Progressing Goal: Ability to manage health-related needs will improve Outcome: Progressing   Problem: Metabolic: Goal: Ability to maintain appropriate glucose levels will improve Outcome: Progressing   Problem: Nutritional: Goal: Maintenance of adequate nutrition will improve Outcome: Progressing Goal: Progress toward achieving an optimal weight will improve Outcome: Progressing   Problem: Skin Integrity: Goal: Risk for impaired skin integrity will decrease Outcome: Progressing   Problem: Tissue Perfusion: Goal: Adequacy of tissue perfusion will improve Outcome: Progressing   Problem: Education: Goal: Knowledge of General Education information will improve Description: Including pain rating scale, medication(s)/side effects and non-pharmacologic comfort measures Outcome: Progressing   Problem: Health Behavior/Discharge Planning: Goal: Ability to manage health-related needs will improve Outcome: Progressing   Problem: Clinical Measurements: Goal: Ability to maintain  clinical measurements within normal limits will improve Outcome: Progressing Goal: Will remain free from infection Outcome: Progressing Goal: Diagnostic test results will improve Outcome: Progressing Goal: Respiratory complications will improve Outcome: Progressing Goal: Cardiovascular complication will be avoided Outcome: Progressing   Problem: Activity: Goal: Risk for activity intolerance will decrease Outcome: Progressing   Problem: Nutrition: Goal: Adequate nutrition will be maintained Outcome: Progressing   Problem: Coping: Goal: Level of anxiety will decrease Outcome: Progressing   Problem: Elimination: Goal: Will not experience complications related to bowel motility Outcome: Progressing Goal: Will not experience complications related to urinary retention Outcome: Progressing   Problem: Pain Managment: Goal: General experience of comfort will improve and/or be controlled Outcome: Progressing   Problem: Safety: Goal: Ability to remain free from injury will improve Outcome: Progressing   Problem: Skin Integrity: Goal: Risk for impaired skin integrity will decrease Outcome: Progressing

## 2024-05-09 NOTE — IPAL (Signed)
  Interdisciplinary Goals of Care Family Meeting   Date carried out: 05/09/2024  Location of the meeting: Conference room  Member's involved: Physician, Bedside Registered Nurse, Family Member or next of kin, and Palliative care team member    GOALS OF CARE DISCUSSION  The Clinical status was relayed to family in detail-Daughter  Updated and notified of patients medical condition- Patient with Progressive multiorgan failure with a very high probablity of a very minimal chance of meaningful recovery despite all aggressive and optimal medical therapy.    PATIENT REMAINS DNR/DNI STATUS  Family understands the situation. Multiorgan disease with progressive dementia Prognosis is very poor Recommend hospice Remains critically ill on pressors  Family are satisfied with Plan of action and management. All questions answered  Additional CC time 35 mins   Albert Figueroa, M.D.  [facility]  Medical Director [facility] Medical Director [facility] Cardio-Pulmonary Department

## 2024-05-09 NOTE — Progress Notes (Signed)
 MD notified: FYI, 5 mg of PRN valium  for anxiety was given as he keep trying to get out of the bed, kept pulling on his foley catheter and kept removing his nasal cannula off.

## 2024-05-09 NOTE — Progress Notes (Signed)
 .    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   On chart review, patient was recently discharged from the hospital on 04/28/24 following admission for AMS and multifactorial shock iso suspected polypharmacy, adrenal insufficiency vs autonomic dysfunction. Per ED reports, patient's home health nurse called EMS for altered mental status. On EMS arrival, patient was lethargic and able to answer only yes or no questions. Initial EMS BP 80/50, temp 101.5 axillary, HR 110, 96O2 on RA. 1000mg  tylenol  IV, 500NS bolus given in route.   ED Course: Initial vital signs showedBP 93/60  Pulse 100   Temp 100 F (37.8 C) (Oral)   Resp (!) 22  SpO2 (!) 88%, O2 @2L . Pertinent Labs/Diagnostics Findings: Na+/ K+:143/2.8 Glucose:85 BUN/Cr.:40/2.71 WBC:4.3 K/L Hgb/Hct:10.4/32.9 Plts: 76 PCT:16.12 Lactic acid:2.6  UA: rare bacteria COVID PCR: Negative,  VBG: pO2 pend; pCO2 52; pH 7.36;HCO3 29.4, %O2 Sat pend.  CXR> RUL pneumonia Patient given 30 cc/kg of fluids and started on broad-spectrum antibiotics Ceftriaxone  and Azityhromycin for sepsis with septic shock iso pneumonia. Patient admitted to [facility] service. However patient remained hypotensive despite IVF boluses therefore was started on Levophed . PCCM consulted.  Past Medical History  Arthritis  Type II Diabetes Mellitus  Emphysema/COPD  Epilepsy HLD HTN CKD Stage IV Lewy Body Dementia  Lumbar Stenosis  Spinal Meningitis  CSF Shunt  Seizure Tremors in both Hands  Significant Hospital Events   05/07/24:Admitted to [facility] service with sepsis  and acute toxic metabolic encephalopathy 2/2 pneumonia, developed recurrent hypotension requiring levophed  gtt. PCCM consulted  8/9 remains on biPAP, severe encephalopathy FiO2 (%):  [30 %-35 %] 30 % 8/10 remains on pressors   Consults:  PCCM  Procedures:  8/8: Left internal jugular cvc  Interim History / Subjective:    Remains critically ill DNR/DNI Remains on pressors CVP 5 plan for more pressors  Micro Data:  8/8: SARS-CoV-2 PCR> negative 8/8: Influenza PCR> negative 8/8: Blood culture x2> 8/8: MRSA PCR>>  8/8: Strep pneumo urinary antigen> 8/8: Legionella urinary antigen>  Antimicrobials:  Azithromycin  8/8>> Ceftriaxone  8/8>>  OBJECTIVE  Blood pressure 100/60, pulse 74, temperature 97.9 F (36.6 C), temperature source Oral, resp. rate 20, height 5' 11 (1.803 m), weight 75.7 kg, SpO2 96%. CVP:  [3 mmHg-13 mmHg] 3 mmHg  FiO2 (%):  [30 %-35 %] 30 %   Intake/Output Summary (Last 24 hours) at 05/09/2024 0752 Last data filed at 05/09/2024 9278 Gross per 24 hour  Intake 1800.27 ml  Output 1245 ml  Net 555.27 ml   Filed Weights   05/07/24 1430  Weight: 75.7 kg      REVIEW OF SYSTEMS More awake  PHYSICAL EXAMINATION:  GENERAL:critically ill appearing EYES: Pupils equal, round, reactive to light.  No scleral icterus.  MOUTH: Moist mucosal membrane. NECK: Supple.  PULMONARY: Lungs clear to auscultation CARDIOVASCULAR: S1 and S2.  Regular rate and rhythm GASTROINTESTINAL: Soft, nontender, -distended. Positive bowel sounds.  MUSCULOSKELETAL:  edema.  NEUROLOGIC: more awake SKIN:normal, warm to touch, Capillary refill delayed  Pulses present bilaterally   Labs/imaging that I havepersonally reviewed  (right  click and Reselect all SmartList Selections daily)    DG Chest 1 View Result Date: 05/07/2024 CLINICAL DATA:  Central venous catheter placement EXAM: CHEST  1 VIEW COMPARISON:  05/07/2024 FINDINGS: Left internal jugular central venous catheter tip  overlies the expected superior vena cava. Progressive consolidation within the right upper lobe. Lung volumes are small, but stable since prior examination. No pneumothorax or pleural effusion. Cardiac size within normal limits. Right vascularity is normal. Ventriculoperitoneal shunt catheter tubing overlies the right hemithorax. No acute bone abnormality. IMPRESSION: 1. Left internal jugular central venous catheter tip within the superior vena cava. No pneumothorax. 2. Progressive right upper lobe consolidation. Follow-up chest radiograph is recommended in 3-4 weeks, following conservative therapy, to document resolution. If persistent at that time, dedicated contrast enhanced CT imaging of the chest is recommended for further evaluation. Electronically Signed   By: Dorethia Molt M.D.   On: 05/07/2024 23:36   DG Chest Port 1 View Result Date: 05/07/2024 CLINICAL DATA:  Shortness of breath EXAM: PORTABLE CHEST 1 VIEW COMPARISON:  Chest x-ray 05/07/2024 FINDINGS: Dense focal airspace opacity in the right upper lobe has increased in size and density. Left lung is clear. Costophrenic angles are clear. No pneumothorax. Cardiomediastinal silhouette is within normal limits. Osseous structures are stable. Catheter overlies the right chest, unchanged. IMPRESSION: Increasing focal airspace opacity in the right upper lobe. Findings are concerning for pneumonia. Follow-up imaging recommended to confirm complete resolution. Electronically Signed   By: Greig Pique M.D.   On: 05/07/2024 19:19   DG Chest Port 1 View Result Date: 05/07/2024 CLINICAL DATA:  Questionable sepsis.  Altered mental status. EXAM: PORTABLE CHEST 1 VIEW COMPARISON:  04/17/2024 FINDINGS: Catheter over the right neck, chest and abdomen likely ventriculostomy catheter unchanged. Lungs are adequately inflated with slight elevation of the left hemidiaphragm. Mild hazy opacification over the right mid to upper lung which may be due to atelectasis or  infection. No effusion. Cardiomediastinal silhouette and remainder of the exam is unchanged. IMPRESSION: Mild hazy opacification over the right mid to upper lung which may be due to atelectasis or infection. Electronically Signed   By: Toribio Agreste M.D.   On: 05/07/2024 15:04    Labs   CBC: Recent Labs  Lab 05/07/24 1438 05/08/24 0437  WBC 4.3 3.9*  NEUTROABS 3.3  --   HGB 10.4* 8.6*  HCT 32.9* 26.0*  MCV 102.5* 100.8*  PLT 76* 71*   Basic Metabolic Panel: Recent Labs  Lab 05/07/24 1430 05/07/24 1438 05/08/24 0437 05/09/24 0404  NA  --  143 142 141  K  --  2.8* 3.5 3.8  CL  --  105 108 105  CO2  --  24 22 23   GLUCOSE  --  85 113* 129*  BUN  --  40* 45* 50*  CREATININE  --  2.71* 2.64* 2.26*  CALCIUM   --  8.6* 7.8* 7.8*  MG 1.7  --   --  1.6*   GFR: Estimated Creatinine Clearance: 33.8 mL/min (A) (by C-G formula based on SCr of 2.26 mg/dL (H)). Recent Labs  Lab 05/07/24 1438 05/07/24 1650 05/07/24 2015 05/08/24 0437 05/09/24 0407  PROCALCITON  --   --  16.12 34.45 37.16  WBC 4.3  --   --  3.9*  --   LATICACIDVEN 2.6* 2.2*  --   --   --    Liver Function Tests: Recent Labs  Lab 05/07/24 1438  AST 24  ALT 10  ALKPHOS 63  BILITOT 1.3*  PROT 7.4  ALBUMIN 2.5*   Recent Labs  Lab 05/07/24 1438  LIPASE 22   Recent Labs  Lab 05/08/24 1059  AMMONIA 31    ABG    Component Value Date/Time   PHART 7.41 05/08/2024 0802   PCO2ART 34 05/08/2024 0802   PO2ART 97 05/08/2024 0802   HCO3 21.6 05/08/2024 0802   ACIDBASEDEF 2.4 (H) 05/08/2024 0802   O2SAT 99.1 05/08/2024 0802   Coagulation Profile: Recent Labs  Lab 05/07/24 1438  INR 1.3*   Cardiac Enzymes: No results for input(s): CKTOTAL, CKMB, CKMBINDEX, TROPONINI in the last 168 hours.  HbA1C: Hgb A1c MFr Bld  Date/Time Value Ref Range Status  01/16/2024 05:28 AM 4.2 (L) 4.8 - 5.6 % Final    Comment:    (NOTE) Pre diabetes:          5.7%-6.4%  Diabetes:               >6.4%  Glycemic control for   <7.0% adults with diabetes   09/01/2022 06:41 PM 5.0 4.8 - 5.6 % Final    Comment:    (NOTE)         Prediabetes: 5.7 - 6.4         Diabetes: >6.4         Glycemic control for adults with diabetes: <7.0     CBG: Recent Labs  Lab 05/08/24 1226 05/08/24 1622 05/08/24 2105 05/09/24 0306 05/09/24 0733  GLUCAP 142* 130* 125* 143* 120*   Review of Systems:   Unable to be obtained secondary to the patient's altered mental status.   Past Medical History  He,  has a past medical history of Arthritis, Diabetes mellitus without complication (HCC), Emphysema/COPD (HCC), Epilepsy (HCC), History of brain shunt, Hyperlipidemia, Hypertension, Kidney disease, Lewy body dementia (HCC), Lumbar stenosis, Meningitis spinal, Osteoarthrosis, Seizure (HCC), Stage 4 chronic kidney disease (HCC), and Tremor of both hands.   Surgical History    Past Surgical History:  Procedure Laterality Date   CATARACT EXTRACTION W/PHACO Right 05/14/2023   Procedure: CATARACT EXTRACTION PHACO AND INTRAOCULAR LENS PLACEMENT (IOC) RIGHT DIABETIC;  Surgeon: Mittie Gaskin, MD;  Location: [facility];  Service: Ophthalmology;  Laterality: Right;  6.70 0:39.8   CATARACT EXTRACTION W/PHACO Left 05/28/2023   Procedure: CATARACT EXTRACTION PHACO AND INTRAOCULAR LENS PLACEMENT (IOC) LEFT DIABETIC 6.34 00:37.1;  Surgeon: Mittie Gaskin, MD;  Location: [facility];  Service: Ophthalmology;  Laterality: Left;   CSF SHUNT     placed in 1980s   KNEE SURGERY     multiple surgeries on both knees     Social History   reports that he has been smoking cigarettes. He has a 96 pack-year smoking history. He has never used smokeless tobacco. He reports that he does not drink alcohol and does not use drugs.   Family History   His family history includes CAD in his father; Cancer in his mother; Diabetes in his mother.   Allergies Allergies  Allergen Reactions   Aricept   [Donepezil ] Other (See Comments)    SEVERE BRADYCARDIA WITH HEART BLOCK DO NOT RESTART AT DISCHARGE   Gabapentin  Other (See Comments)    Lethargic, Confused   Amlodipine     Other reaction(s): Other (see comments) Caused elevated blood pressure and headaches   Ozempic (0.25 Or 0.5 Mg-Dose) [Semaglutide(0.25 Or 0.5mg -Dos)]     Lethargy.  Also dropped blood sugars too low   Home Medications  Prior to Admission medications   Medication Sig Start Date  End Date Taking? Authorizing Provider  acetaminophen  (TYLENOL ) 325 MG tablet Take 2 tablets (650 mg total) by mouth every 6 (six) hours as needed for mild pain (or Fever >/= 101). 09/07/22   Krishnan, Sendil K, MD  albuterol  (VENTOLIN  HFA) 108 [phone] Base) MCG/ACT inhaler Inhale into the lungs every 6 (six) hours as needed for wheezing or shortness of breath.    [provider]  cyclobenzaprine  (FEXMID ) 7.5 MG tablet Take 1 tablet (7.5 mg total) by mouth 3 (three) times daily as needed for muscle spasms. Home med. 04/28/24   Awanda City, MD  dexlansoprazole (DEXILANT) 60 MG capsule Take 60 mg by mouth daily.    [provider]  divalproex  (DEPAKOTE  ER) 500 MG 24 hr tablet Take 1,000 mg by mouth in the morning and at bedtime.    [provider]  donepezil  (ARICEPT  ODT) 10 MG disintegrating tablet Take 10 mg by mouth at bedtime.    [provider]  feeding supplement (ENSURE PLUS HIGH PROTEIN) LIQD Take 237 mLs by mouth 3 (three) times daily between meals. 04/28/24   Awanda City, MD  fenofibrate  160 MG tablet Take 160 mg by mouth daily. 04/25/22   [provider]  ferrous sulfate  325 (65 FE) MG tablet Take 325 mg by mouth daily with breakfast.    [provider]  Fluticasone -Umeclidin-Vilant (TRELEGY ELLIPTA) 100-62.5-25 MCG/ACT AEPB Inhale into the lungs daily.    [provider]  hydrOXYzine  (ATARAX ) 10 MG tablet Take 10 mg by mouth 3 (three) times daily as needed.    [provider]   lidocaine  (LIDODERM ) 5 % Place 1 patch onto the skin daily. 04/05/24   [provider]  midodrine  (PROAMATINE ) 2.5 MG tablet Take 1 tablet (2.5 mg total) by mouth 3 (three) times daily with meals. 04/28/24   Awanda City, MD  Multiple Vitamin (MULTIVITAMIN WITH MINERALS) TABS tablet Take 1 tablet by mouth daily. 04/29/24   Awanda City, MD  naloxone  (NARCAN ) nasal spray 4 mg/0.1 mL Place 1 spray into the nose once as needed for up to 1 dose. 04/28/24   Awanda City, MD  Oxycodone  HCl 20 MG TABS Take 0.5 tablets (10 mg total) by mouth every 4 (four) hours as needed. Home med.  Reduced from 20 mg. 04/28/24   Awanda City, MD  polyethylene glycol (MIRALAX  / GLYCOLAX ) 17 g packet Take 17 g by mouth daily. 04/28/24   Awanda City, MD  rosuvastatin  (CRESTOR ) 20 MG tablet Take 20 mg by mouth at bedtime. 04/08/22   [provider]  Scheduled Meds:  budesonide -glycopyrrolate -formoterol   2 puff Inhalation BID   Chlorhexidine  Gluconate Cloth  6 each Topical Daily   feeding supplement  237 mL Oral TID BM   ferrous sulfate   325 mg Oral Q breakfast   hydrocortisone  sod succinate (SOLU-CORTEF ) inj  50 mg Intravenous Q8H   insulin  aspart  0-5 Units Subcutaneous QHS   insulin  aspart  0-9 Units Subcutaneous TID WC   ipratropium-albuterol   3 mL Nebulization BID   midodrine   2.5 mg Oral TID WC   mouth rinse  15 mL Mouth Rinse 4 times per day   pantoprazole   40 mg Oral Daily   rosuvastatin   20 mg Oral QHS   sodium chloride  flush  10-40 mL Intracatheter Q12H   vancomycin  variable dose per unstable renal function (pharmacist dosing)   Does not apply See admin instructions   Continuous Infusions:  azithromycin  Stopped (05/08/24 1728)   ceFEPime  (MAXIPIME ) IV  lactated ringers      magnesium  sulfate bolus IVPB     norepinephrine  (LEVOPHED ) Adult infusion 5 mcg/min (05/09/24 0602)   valproate sodium  500 mg (05/09/24 0505)   vasopressin  Stopped (05/08/24 1238)   PRN Meds:.acetaminophen  **OR** acetaminophen ,  cyclobenzaprine , diazepam , hydrOXYzine , nicotine , ondansetron  **OR** ondansetron  (ZOFRAN ) IV, mouth rinse, senna-docusate, sodium chloride  flush   Assessment & Plan:   68 yo white male with severe acute hypoxic resp failure due to COPD exacerbation and severe RT sided pneumonia  SEPTIC shock SOURCE-pneumonia -use vasopressors to keep MAP>65 as needed -follow ABG and LA as needed -follow up cultures -emperic ABX -consider stress dose steroids -aggressive IV fluid Resuscitation  SEVERE COPD EXACERBATION -continue IV steroids as prescribed -continue NEB THERAPY as prescribed -wean fio2 as needed and tolerated   Severe ACUTE Hypoxic and Hypercapnic Respiratory Failure - Intermittent chest x-ray & ABG PRN - Ensure adequate pulmonary hygiene  biPAP as needed Wean fio2 as tolerated  CARDIAC -Echocardiogram 04/17/24: LVEF 60-65%, grade I DD, RV systolic function is normal, RV size normal -Vasopressors as needed to maintain MAP goal  -Continue Midodrine  10 mg TID -start Stress Dose Steroids -trend lactate -HS Troponin is normal  Acute  Metabolic Encephalopathy  Seizure disorder Chronic pain  Recurrent falls  Hx: Lewy body dementia, meningitis s/p VP shunt, and epilepsy  -Consider CT head if no improvement in mental status -seizure precautions -Continue Depakote  High risk for aspiration   ACUTE KIDNEY INJURY/Renal Failure -continue Foley Catheter-assess need -Avoid nephrotoxic agents -Follow urine output, BMP -Ensure adequate renal perfusion, optimize oxygenation -Renal dose medications   Intake/Output Summary (Last 24 hours) at 05/09/2024 0755 Last data filed at 05/09/2024 9278 Gross per 24 hour  Intake 1800.27 ml  Output 1245 ml  Net 555.27 ml      Latest Ref Rng & Units 05/09/2024    4:04 AM 05/08/2024    4:37 AM 05/07/2024    2:38 PM  BMP  Glucose 70 - 99 mg/dL 870  886  85   BUN 8 - 23 mg/dL 50  45  40   Creatinine 0.61 - 1.24 mg/dL 7.73  7.35  7.28   Sodium  135 - 145 mmol/L 141  142  143   Potassium 3.5 - 5.1 mmol/L 3.8  3.5  2.8   Chloride 98 - 111 mmol/L 105  108  105   CO2 22 - 32 mmol/L 23  22  24    Calcium  8.9 - 10.3 mg/dL 7.8  7.8  8.6       ENDO - ICU hypoglycemic\Hyperglycemia protocol -check FSBS per protocol   GI GI PROPHYLAXIS as indicated NUTRITIONAL STATUS DIET-->NPO, OBTAIN SPEECH THERAPY  Constipation protocol as indicated   ELECTROLYTES -follow labs as needed -replace as needed -pharmacy consultation and following  RESTRICTIVE TRANSFUSION PROTOCOL TRANSFUSION  IF HGB<7  or ACTIVE BLEEDING OR DX of ACUTE CORONARY SYNDROMES     Best practice:  Diet:  NPO Pain/Anxiety/Delirium protocol (if indicated): No VAP protocol (if indicated): Not indicated DVT prophylaxis: LMWH GI prophylaxis: PPI Glucose control:  SSI Yes Central venous access:  Yes, and it is still needed Arterial line:  N/A Foley:  Yes, and it is still needed Mobility:  bed rest  PT consulted: N/A Last date of multidisciplinary goals of care discussion []  Code Status:  DNR Disposition: ICU   = Goals of Care = Code Status Order: DNR  Primary Emergency Contact: Lynn,[name] Patient wishes to pursue ongoing treatment, but concurred that if deteriorated to pulselessness,  patient would prefer a natural death as opposed to invasive measures such as CPR and intubation.  Family should be immediately contacted in such situations.     DVT/GI PRX  assessed I Assessed the need for Labs I Assessed the need for Foley I Assessed the need for Central Venous Line Family Discussion when available I Assessed the need for Mobilization I made an Assessment of medications to be adjusted accordingly Safety Risk assessment completed  CASE DISCUSSED IN MULTIDISCIPLINARY ROUNDS WITH ICU TEAM     Critical Care Time devoted to patient care services described in this note is 60 minutes.  Critical care was necessary to treat /prevent imminent and  life-threatening deterioration. Overall, patient is critically ill, prognosis is guarded.  Patient with Multiorgan failure and at high risk for cardiac arrest and death.    Nickolas Alm Cellar, M.D.  [facility]  Medical Director [facility] Medical Director [facility] Cardio-Pulmonary Department

## 2024-05-09 NOTE — Progress Notes (Signed)
 Daily Progress Note   Patient Name: Albert Figueroa       Date: 05/09/2024 DOB: 02-03-1956  Age: 68 y.o. MRN#: [id number] Attending Physician: Isaiah Scrivener, MD Primary Care Physician: Alla Amis, MD Admit Date: 05/07/2024  Reason for Consultation/Follow-up: Establishing goals of care  HPI/Brief Hospital Review:  68 y.o. 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  Reportedly found by his home health nurse altered which prompted EMS to be called.  On arrival to the ED, Albert Figueroa was lethargic.  Found to have sepsis and acute toxic metabolic encephalopathy secondary to significant right sided pneumonia, became hypoxic.  Requiring BiPAP and vasopressor support.   Noted 6 inpatient admissions within the last 6 months   Last discharge 7/30, admission due to altered mental status and multifactorial shock, possible polypharmacy as well as adrenal insufficiency versus autonomic dysfunction   Palliative medicine was consulted for assisting with goals of care conversations.  Subjective: Extensive chart review has been completed prior to meeting patient including labs, vital signs, imaging, progress notes, orders, and available advanced directive documents from current and previous encounters.    Visited with Albert Figueroa at his bedside. He is off bipap, awake, alert and able to engage in conversation. He is oriented to person and place. Disoriented to time and situation. He is able to follow simple commands but requires assistance with multi step commands and unable to engage in complex decision making.  Daughter-Katie at bedside.  Spoke with [name] outside of room. [name] speaks to Albert Figueroa significant decline in function and cognition over the last several months. She compares Albert Figueroa's baseline as not being too far off from assessment today. [name] shares she and her brother-Steven do not necessarily agree with what is best for Albert Figueroa. [name] shares for many months she has expressed concerns related to the safety of Albert Figueroa living alone. Her brother visits with Albert Figueroa twice daily as he lives so close but feels there may be an aspect of denial related to Albert Figueroa decline.  We discussed Albert Figueroa current condition and repeated hospitalizations. We discussed chronic disease trajectory related to his multiple underlying comorbid  conditions. We also discussed the anticipated trajectory of Lewy Body dementia and signs of progression or late stage. We also discussed concerns related to poor PO intake further complicated by Albert Figueroa high risk for aspiration.  Dr. Isaiah joined conversation and reviewed medical conditions related to Albert Figueroa cognitive status, respiratory status and renal function. Discussions also had on his ongoing need for vasopressor support. Concern relayed to [facility] regarding Albert Figueroa ability to meet his daily caloric needs-artificial support was also addressed. [name] was clear in stating Albert Figueroa would not be accepting of NGT/PEG tube for artificial nutritional support.  Dr. Isaiah and myself both present and reviewed the overall philosophy of hospice, type of care provided by hospice and the difference between hospice at home versus LTC versus IPU. We also briefly discussed comfort care in the event Albert Figueroa decompensates. Katie verbalizes understanding and would consider hospice/comfort. She is hopeful to have conversations with her brother and to gain his support in making end of life decisions for Albert Figueroa.  I attempted a call to [facility] and unable to leave a  voicemail.  Answered all questions and concerns. PMT to continue to follow for ongoing needs and support.  Objective:  Physical Exam Constitutional:      General: He is not in acute distress.    Appearance: He is ill-appearing.  Abdominal:     General: Abdomen is flat. There is no distension.     Tenderness: There is no abdominal tenderness.  Musculoskeletal:     Right lower leg: 2+ Pitting Edema present.     Left lower leg: 2+ Pitting Edema present.  Skin:    General: Skin is warm and dry.  Neurological:     Mental Status: He is alert.     Motor: Weakness present.     Comments: Oriented to person and place, intermittent confusion remains             Vital Signs: BP (!) 96/58   Pulse 72   Temp (!) 97.4 F (36.3 C) (Axillary)   Resp 17   Ht 5' 11 (1.803 m)   Wt 75.7 kg   SpO2 96%   BMI 23.28 kg/m  SpO2: SpO2: 96 % O2 Device: O2 Device: Nasal Cannula O2 Flow Rate: O2 Flow Rate (L/min): 1 L/min   Palliative Care Assessment & Plan   Assessment/Recommendation/Plan  Ongoing GOC to be had with family-time for outcomes PMT to continue to follow for ongoing needs and support  Care plan was discussed with CCM team and nursing staff.  Thank you for allowing the Palliative Medicine Team to assist in the care of this patient.  Total time:  65 minutes  Time spent includes: Detailed review of medical records (labs, imaging, vital signs), medically appropriate exam (mental status, respiratory, cardiac, skin), discussed with treatment team, counseling and educating patient, family and staff, documenting clinical information, medication management and coordination of care.  Waddell Lesches, DNP, AGNP-C Palliative Medicine   Please contact Palliative Medicine Team phone at [phone] for questions and concerns.

## 2024-05-09 NOTE — Evaluation (Signed)
 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, spinal meningitis   Hyperlipidemia    Hypertension    Kidney disease    stage 3   Lewy body dementia (HCC)    short term memory issues only   Lumbar stenosis    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   Osteoarthrosis    Seizure (HCC)    none since 2009   Stage 4 chronic kidney disease (HCC)    Tremor of both hands    Past Surgical History:  Past Surgical History:  Procedure Laterality Date   CATARACT EXTRACTION W/PHACO Right 05/14/2023   Procedure: CATARACT EXTRACTION PHACO AND INTRAOCULAR LENS PLACEMENT (IOC) RIGHT DIABETIC;  Surgeon: Mittie Gaskin, MD;  Location: [facility];  Service: Ophthalmology;  Laterality: Right;  6.70 0:39.8   CATARACT EXTRACTION W/PHACO Left 05/28/2023   Procedure: CATARACT EXTRACTION PHACO AND INTRAOCULAR LENS PLACEMENT (IOC) LEFT DIABETIC 6.34 00:37.1;  Surgeon: Mittie Gaskin, MD;  Location: [facility];  Service: Ophthalmology;  Laterality: Left;   CSF SHUNT     placed in 1980s   KNEE SURGERY     multiple surgeries on both knees   HPI:  Pt is a 68 y.o. 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  Reportedly found by his home health nurse altered which prompted EMS to be called.  On arrival  to the ED, Albert Figueroa was lethargic.  Found to have sepsis and acute toxic metabolic encephalopathy secondary to significant right sided pneumonia, became hypoxic.  Requiring BiPAP and vasopressor support.     Noted 6 inpatient admissions within the last 5-6 months     Last discharge 7/30, admission due to altered mental status and multifactorial shock, possible polypharmacy as well as adrenal insufficiency versus autonomic dysfunction     Palliative medicine was consulted for assisting with goals of care conversations.  CXR: Interval decrease in right upper lung airspace disease with probable  layering left effusion.    Assessment / Plan / Recommendation  Clinical Impression   Pt seen for BSE today- he is a little improved from yesterday per Palliative Care. Pt is known the ST services; multiple visits per chart, and 2 in recent 5-6 months. Pt drowsy but awakened easily to verbal stim and name called. Pt exhbited Confusion in his responses and requests; impact from baseline Lewy Body Dementia. Total assist w/ feeding/po intake. Pt often closed eyes during session -- limited trials assessed d/t concern for aspiration. Pt has a Baseline of Cognitive-communication decline per chart -- Lewy Body Dementia. Recent weight loss reported. Pt requesting something to drink.  On 1-2L O2 via [place], afebrile. WBC WNL.    Pt appears to present w/ concern for oropharyngeal phase Dysphagia in setting of Cognitive decline and reduced awareness/alertness during po  tasks. He often closed eyes b/t trials. MOD+ cues required during po tasks. Pt's oropharyngeal swallow function w/ trials of Puree and Nectar consistency liquids appeared grossly WFL; No oropharyngeal neuromuscular deficits noted. Pt consumed po trials w/ No overt, clinical s/s of aspiration during the trials.  Pt appears at risk for aspiration/aspiration pneumonia but this risk can be reduced when following general aspiration precautions and when using a modified  diet consistency; Supervision w/ all oral intake.  Pt has challenging factors that could impact oropharyngeal phase swallowing as well as overall oral intake including deconditioned/weakness, Cognitive-communication decline(LB Dementia), feeding support need, and Edentulous status.    During po trials, pt consumed consistencies assessed w/ No overt coughing, decline in vocal quality, or change in respiratory presentation during/post trials. O2 sats 98% during session. Oral phase appeared grossly [facility] w/ timely bolus management and control of bolus propulsion for A-P transfer for swallowing. Oral clearing achieved w/ the trial consistencies. Pt required TIME for oral clearing x2-3 when distracted.  OM exam was cursory but no unilateral weakness noted during bolus management.   Recommend initiation of a Dysphagia level 1 diet w/ Nectar liquids; well-moistened foods. Pt should help to hold Cup when drinking as able but will need Support/Supervision during meals w/ feeding d/t Cognitive decline. Recommend general aspiration and Reflux precautions. Reduce distractions during meals. Pills CRUSHED vs WHOLE in Puree for safer, easier swallowing.    Education given on Pills in Puree to NSG, pt; food consistencies and options; general aspiration and Reflux precautions to pt and NSG. ST services will continue to follow w/ trials to upgrade diet consistency as appropriate. Precautions posted in room, chart. NSG agreed. PC updated. Recommend Dietician f/u.  SLP Visit Diagnosis: Dysphagia, oropharyngeal phase (R13.12) (in setting of Cognitive decline- LB Dementia; some drowsiness; hospitalization again; Edentulous)    Aspiration Risk  Mild aspiration risk;Risk for inadequate nutrition/hydration (reduced w/ precautions; modified diet)    Diet Recommendation   Nectar;Dysphagia 1 (puree) (initial) = initiation of a Dysphagia level 1 diet w/ Nectar liquids; well-moistened foods. Pt should help to hold Cup when drinking  as able but will need Support/Supervision during meals w/ feeding d/t Cognitive decline. Recommend general aspiration and Reflux precautions. Reduce distractions during meals.  Medication Administration: Crushed with puree    Other  Recommendations Recommended Consults:  [facility] Care following) Oral Care Recommendations: Oral care BID;Oral care before and after PO;Staff/trained caregiver to provide oral care Caregiver Recommendations: Avoid jello, ice cream, thin soups, popsicles;Remove water pitcher;Have oral suction available     Assistance Recommended at Discharge  FULL d/t Cognitive decline  Functional Status Assessment Patient has had a recent decline in their functional status and/or demonstrates limited ability to make significant improvements in function in a reasonable and predictable amount of time  Frequency and Duration min 2x/week  2 weeks       Prognosis Prognosis for improved oropharyngeal function: Fair Barriers to Reach Goals: Cognitive deficits;Language deficits;Time post onset;Severity of deficits;Behavior Barriers/Prognosis Comment: in setting of Cognitive decline- LB Dementia; some drowsiness; hospitalization again; Edentulous      Swallow Study   General Date of Onset: 05/07/24 HPI: Pt is a 68 y.o. 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  Reportedly found by his home health nurse altered which prompted EMS to be  called.  On arrival to the ED, Mr. Emanuele was lethargic.  Found to have sepsis and acute toxic metabolic encephalopathy secondary to significant right sided pneumonia, became hypoxic.  Requiring BiPAP and vasopressor support.     Noted 6 inpatient admissions within the last 5-6 months     Last discharge 7/30, admission due to altered  mental status and multifactorial shock, possible polypharmacy as well as adrenal insufficiency versus autonomic dysfunction     Palliative medicine was consulted for assisting with goals of care conversations.  CXR: Interval decrease in right upper lung airspace disease with probable  layering left effusion. Type of Study: Bedside Swallow Evaluation Previous Swallow Assessment: 04/18/2024; 12/18/2023; multiple in 2023 Diet Prior to this Study: Dysphagia 3 (mechanical soft);Thin liquids (Level 0) (chopped, moistened foods) Temperature Spikes Noted: No (wbc 3.9) Respiratory Status: Nasal cannula (1-2L) History of Recent Intubation: No Behavior/Cognition: Alert;Cooperative;Pleasant mood;Confused;Lethargic/Drowsy;Distractible;Requires cueing (LB Dementia) Oral Cavity Assessment: Dry Oral Care Completed by SLP: Yes Oral Cavity - Dentition: Edentulous Vision:  (n/a) Self-Feeding Abilities: Total assist Patient Positioning: Upright in bed (full support) Baseline Vocal Quality: Normal Volitional Cough: Cognitively unable to elicit Volitional Swallow: Unable to elicit    Oral/Motor/Sensory Function Overall Oral Motor/Sensory Function: Within functional limits (no unilateral weakness noted during bolus management, clearing)   Ice Chips Ice chips: Within functional limits Presentation: Spoon (fed; 3 trials)   Thin Liquid Thin Liquid: Not tested Other Comments: closed eyes often    Nectar Thick Nectar Thick Liquid: Within functional limits Presentation: Spoon;Straw (3 trials via each)   Honey Thick Honey Thick Liquid: Not tested   Puree Puree: Within functional limits Presentation: Spoon (fed; 10+ trials)   Solid     Solid: Not tested Other Comments: close eyes often        Comer Portugal, MS, CCC-SLP Speech Language Pathologist Rehab Services; [facility] [place] [phone] (ascom) Victor Granados 05/09/2024,12:52 PM

## 2024-05-10 DIAGNOSIS — N179 Acute kidney failure, unspecified: Secondary | ICD-10-CM | POA: Diagnosis not present

## 2024-05-10 DIAGNOSIS — A419 Sepsis, unspecified organism: Secondary | ICD-10-CM | POA: Diagnosis not present

## 2024-05-10 DIAGNOSIS — E876 Hypokalemia: Secondary | ICD-10-CM | POA: Diagnosis not present

## 2024-05-10 DIAGNOSIS — Z515 Encounter for palliative care: Secondary | ICD-10-CM | POA: Diagnosis not present

## 2024-05-10 LAB — CBC
HCT: 24.1 % — ABNORMAL LOW (ref 39.0–52.0)
Hemoglobin: 7.9 g/dL — ABNORMAL LOW (ref 13.0–17.0)
MCH: 32.4 pg (ref 26.0–34.0)
MCHC: 32.8 g/dL (ref 30.0–36.0)
MCV: 98.8 fL (ref 80.0–100.0)
Platelets: 54 K/uL — ABNORMAL LOW (ref 150–400)
RBC: 2.44 MIL/uL — ABNORMAL LOW (ref 4.22–5.81)
RDW: 19.8 % — ABNORMAL HIGH (ref 11.5–15.5)
WBC: 5.8 K/uL (ref 4.0–10.5)
nRBC: 0 % (ref 0.0–0.2)

## 2024-05-10 LAB — BASIC METABOLIC PANEL WITH GFR
Anion gap: 8 (ref 5–15)
BUN: 65 mg/dL — ABNORMAL HIGH (ref 8–23)
CO2: 24 mmol/L (ref 22–32)
Calcium: 7.9 mg/dL — ABNORMAL LOW (ref 8.9–10.3)
Chloride: 110 mmol/L (ref 98–111)
Creatinine, Ser: 2.18 mg/dL — ABNORMAL HIGH (ref 0.61–1.24)
GFR, Estimated: 32 mL/min — ABNORMAL LOW (ref >60)
Glucose, Bld: 140 mg/dL — ABNORMAL HIGH (ref 70–99)
Potassium: 3.4 mmol/L — ABNORMAL LOW (ref 3.5–5.1)
Sodium: 142 mmol/L (ref 135–145)

## 2024-05-10 LAB — GLUCOSE, CAPILLARY
Glucose-Capillary: 72 mg/dL (ref 70–99)
Glucose-Capillary: 128 mg/dL — ABNORMAL HIGH (ref 70–99)
Glucose-Capillary: 148 mg/dL — ABNORMAL HIGH (ref 70–99)
Glucose-Capillary: 154 mg/dL — ABNORMAL HIGH (ref 70–99)
Glucose-Capillary: 184 mg/dL — ABNORMAL HIGH (ref 70–99)

## 2024-05-10 LAB — LEGIONELLA PNEUMOPHILA SEROGP 1 UR AG: L. pneumophila Serogp 1 Ur Ag: NEGATIVE

## 2024-05-10 LAB — MAGNESIUM: Magnesium: 2.4 mg/dL (ref 1.7–2.4)

## 2024-05-10 MED ORDER — AZITHROMYCIN 250 MG PO TABS
500.0000 mg | ORAL_TABLET | Freq: Every day | ORAL | Status: AC
  Administered 2024-05-10 (×2): 500 mg via ORAL
  Filled 2024-05-10: qty 2

## 2024-05-10 MED ORDER — OXYCODONE HCL 5 MG PO TABS: 10.0000 mg | ORAL_TABLET | ORAL | Status: DC | PRN

## 2024-05-10 MED ORDER — MIDODRINE HCL 5 MG PO TABS
5.0000 mg | ORAL_TABLET | Freq: Three times a day (TID) | ORAL | Status: DC
Start: 2024-05-10 — End: 2024-05-12
  Administered 2024-05-10 (×4): 5 mg via ORAL
  Filled 2024-05-10 (×2): qty 1

## 2024-05-10 MED ORDER — LACTATED RINGERS IV BOLUS
500.0000 mL | Freq: Once | INTRAVENOUS | Status: AC
  Administered 2024-05-10 (×2): 500 mL via INTRAVENOUS

## 2024-05-10 MED ORDER — POTASSIUM CHLORIDE 20 MEQ PO PACK
40.0000 meq | PACK | Freq: Once | ORAL | Status: AC
  Administered 2024-05-10 (×2): 40 meq via ORAL
  Filled 2024-05-10: qty 2

## 2024-05-10 MED ORDER — VALPROIC ACID 250 MG/5ML PO SOLN
500.0000 mg | Freq: Four times a day (QID) | ORAL | Status: DC
  Administered 2024-05-10 (×6): 500 mg via ORAL
  Filled 2024-05-10 (×6): qty 10

## 2024-05-10 MED ORDER — NEPRO/CARBSTEADY PO LIQD
237.0000 mL | Freq: Three times a day (TID) | ORAL | Status: DC
  Administered 2024-05-10 – 2024-05-12 (×5): 237 mL via ORAL

## 2024-05-10 MED ORDER — CEFUROXIME AXETIL 500 MG PO TABS
500.0000 mg | ORAL_TABLET | Freq: Two times a day (BID) | ORAL | Status: DC
  Administered 2024-05-10 (×2): 500 mg via ORAL
  Filled 2024-05-10 (×2): qty 1

## 2024-05-10 MED ORDER — FLUDROCORTISONE ACETATE 0.1 MG PO TABS
0.0500 mg | ORAL_TABLET | Freq: Every day | ORAL | Status: DC
  Administered 2024-05-10 (×2): 0.05 mg via ORAL
  Filled 2024-05-10: qty 1

## 2024-05-10 NOTE — TOC Initial Note (Signed)
 Transition of Care [facility]) - Initial/Assessment Note    Patient Details  Name: Albert Figueroa MRN: [id number] Date of Birth: 09-06-1956  Transition of [facility]) CM/SW Contact:    Corrie JINNY Ruts, LCSW Phone Number: 05/10/2024, 11:49 AM  Clinical Narrative:                 Chart Reviewed. The patient was admitted for Severe Sepsis with acute organ dysfunction. Patient was recently discharged from [facility] 7/30 to [facility]. I spoke with the patient daughter, Albert Figueroa. Ms. Macario reports that the patient left [facility] due to not walking to be in a SNF. The patient daughter reports being active with [facility]. The patient daughter reports that the patient is not doing clinically well and there could be a possible Hospice consult. The patient daughter reports that the patient will go back to [facility] at discharge depending on the patient medical status.   PT/OT eval was ordered to see client progress and the next level of care. TOC will continue to follow the patient until discharge.     Barriers to Discharge: Continued Medical Work up   Patient Goals and CMS Choice            Expected Discharge Plan and Services       Living arrangements for the past 2 months: Skilled Nursing Facility, Apartment                             [facility] Agency: [facility]        Prior Living Arrangements/Services Living arrangements for the past 2 months: Skilled Nursing Facility, Apartment Lives with:: Self Patient language and need for interpreter reviewed:: Yes        Need for Family Participation in Patient Care: Yes (Comment)   Current home services: DME (Patient daughter reports that he has a walker in the home.) Criminal Activity/Legal Involvement Pertinent to Current Situation/Hospitalization: No - Comment as needed  Activities of Daily Living      Permission Sought/Granted                  Emotional Assessment         Alcohol  / Substance  Use: Illicit Drugs Psych Involvement: No (comment)  Admission diagnosis:  Hypokalemia [E87.6] AKI (acute kidney injury) (HCC) [N17.9] Severe sepsis with acute organ dysfunction (HCC) [A41.9, R65.20] Pneumonia of right middle lobe due to infectious organism [J18.9] Sepsis with acute renal failure without septic shock, due to unspecified organism, unspecified acute renal failure type (HCC) [A41.9, R65.20, N17.9] Patient Active Problem List   Diagnosis Date Noted   Severe sepsis with acute organ dysfunction (HCC) 05/07/2024   Confusion 04/21/2024   Cardiogenic shock (HCC) 04/20/2024   Hypovolemic shock (HCC) 04/17/2024   Seizure disorder (HCC) 04/17/2024   Lewy body dementia (HCC) 04/17/2024   Right leg weakness 04/17/2024   Orthostatic hypotension 03/07/2024   Nondisplaced fracture of left acromial process 03/06/2024   History of opiate overdose treated with Narcan  infusion 12/2023 03/06/2024   History of seizure 03/06/2024   Sinus bradycardia 03/06/2024   Polypharmacy 01/15/2024   Opioid overdose (HCC) 01/15/2024   Diarrhea 12/30/2023   Hyperammonemia (HCC) 12/30/2023   Weakness 12/30/2023   Acute kidney injury superimposed on CKD (HCC) 12/10/2023   Hypernatremia 12/10/2023   Type 2 diabetes mellitus with chronic kidney disease, without long-term current use of insulin  (HCC) 12/10/2023   Dementia with behavioral  disturbance (HCC) 12/10/2023   Moderate Lewy body dementia without behavioral disturbance, psychotic disturbance, mood disturbance, or anxiety (HCC) 05/13/2023   Dehydration 03/05/2023   Acute renal failure superimposed on chronic kidney disease (HCC) 03/04/2023   Seizure (HCC) 03/04/2023   Pancytopenia (HCC) 09/06/2022   Hypoglycemia 09/03/2022   Acute metabolic encephalopathy 09/02/2022   Hypotension 09/02/2022   Blepharitis of right lower eyelid 09/02/2022   Dyslipidemia 09/02/2022   Pulmonary hypertension (HCC) 09/02/2022   Depression with anxiety 09/02/2022    GERD without esophagitis 09/02/2022   Lactic acidosis 09/02/2022   Acute kidney injury superimposed on stage IIIb chronic kidney disease (HCC) 09/01/2022   Chronic pain 07/05/2022   Type 2 diabetes mellitus with renal complication (HCC) 07/05/2022   Over weight 07/05/2022   Thrombocytopenia (HCC) 07/05/2022   Fall at home, initial encounter 07/05/2022   Cellulitis of right hand 07/05/2022   COPD (chronic obstructive pulmonary disease) (HCC) 07/05/2022   Iron deficiency anemia 07/05/2022   Toxic encephalopathy 07/05/2022   Neuropathy 07/05/2022   Right wrist pain    Chronic pain syndrome 01/01/2021   Pharmacologic therapy 01/01/2021   Disorder of skeletal system 01/01/2021   Problems influencing health status 01/01/2021   Aortic atherosclerosis (HCC) 02/24/2020   Other emphysema (HCC) 02/24/2020   Medicare annual wellness visit, initial 02/03/2020   Neck pain 09/15/2018   Sacroiliac joint pain 09/08/2018   Lumbar spondylosis 06/11/2018   Left hip pain 06/11/2018   Chronic pain of both knees 06/11/2018   CKD (chronic kidney disease) stage 4, GFR 15-29 ml/min (HCC) 10/29/2017   Obesity (BMI 30-39.9) 11/27/2015   Tobacco use 06/13/2015   Essential hypertension 06/13/2015   Hyperlipidemia, unspecified 06/13/2015   Osteoarthritis 06/13/2015   Biceps tendinitis 04/06/2015   Epilepsy without status epilepticus, not intractable (HCC) 12/10/2013   PCP:  Alla Amis, MD Pharmacy:   [facility] [phone] - [place], [place] - [address] AT [facility] [address] [place] [address] [place] [address] Phone: [phone] Fax: [phone]     Social Drivers of Health (SDOH) Social History: SDOH Screenings   Food Insecurity: Patient Unable To Answer (05/08/2024)  Housing: Unknown (05/08/2024)  Transportation Needs: Patient Unable To Answer (05/08/2024)  Utilities: Patient Unable To Answer (05/08/2024)  Financial Resource Strain: High Risk (10/06/2023)   Received from  [facility]  Social Connections: Patient Unable To Answer (05/08/2024)  Tobacco Use: High Risk (05/07/2024)   SDOH Interventions:     Readmission Risk Interventions    05/10/2024   10:59 AM 04/28/2024   12:08 PM 12/30/2023   12:01 PM  Readmission Risk Prevention Plan  Transportation Screening Complete Complete Complete  PCP or Specialist Appt within 3-5 Days   Complete  Social Work Consult for Recovery Care Planning/Counseling   Complete  Palliative Care Screening   Not Applicable  Medication Review Oceanographer) Complete Complete Complete  PCP or Specialist appointment within 3-5 days of discharge Complete Not Complete   PCP/Specialist Appt Not Complete comments  Pt. to be followed up by SNF   SW Recovery Care/Counseling Consult Complete    Skilled Nursing Facility  Complete

## 2024-05-10 NOTE — Progress Notes (Signed)
 MAP goal per Dr. Parris is 60.

## 2024-05-10 NOTE — Progress Notes (Signed)
 Daily Progress Note   Patient Name: Albert Figueroa       Date: 05/10/2024 DOB: Jul 30, 1956  Age: 68 y.o. MRN#: [id number] Attending Physician: Parris Manna, MD Primary Care Physician: Alla Amis, MD Admit Date: 05/07/2024  Reason for Consultation/Follow-up: Establishing goals of care  HPI/Brief Hospital Review: 68 y.o. 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  Reportedly found by his home health nurse altered which prompted EMS to be called.  On arrival to the ED, Mr. Schirmer was lethargic.  Found to have sepsis and acute toxic metabolic encephalopathy secondary to significant right sided pneumonia, became hypoxic.  Requiring BiPAP and vasopressor support.   Noted 6 inpatient admissions within the last 6 months   Last discharge 7/30, admission due to altered mental status and multifactorial shock, possible polypharmacy as well as adrenal insufficiency versus autonomic dysfunction   Palliative medicine was consulted for assisting with goals of care conversations.  Subjective: Extensive chart review has been completed prior to meeting patient including labs, vital signs, imaging, progress notes, orders, and available advanced directive documents from current and previous encounters.    Visited with Mr. Doenges earlier in day. On assessment, he is drowsy, unable to participate in conversations or answer orientation questions, intermittently able to follow one step commands. Nursing staff at bedside, shares Mr. Balin's mentation waxes and wanes as he was able to have a conversation earlier today  and consumed about half of his breakfast meal.  Called and briefly spoke with son-Steven, unable to have goals of care conversation over phone as background noise and connection poor. Elspeth set meeting time at bedside later today around 4-5pm.  1745-son has not arrived at bedside. PMT will continue to follow and establish surrogate decision maker. Daughter-Katie has been an active participant in meeting an discussing Mr. Lazare's condition with medical team.  Objective:  Physical Exam Constitutional:      General: He is not in acute distress.    Appearance: He is ill-appearing.     Comments: Drowsy  Pulmonary:     Effort: Pulmonary effort is normal. No respiratory distress.  Skin:    General: Skin is warm and  dry.  Neurological:     Mental Status: He is disoriented.     Motor: Weakness present.             Vital Signs: BP (!) 78/53   Pulse 65   Temp (!) 97.2 F (36.2 C) (Axillary)   Resp 14   Ht 5' 11 (1.803 m)   Wt 75.7 kg   SpO2 95%   BMI 23.28 kg/m  SpO2: SpO2: 95 % O2 Device: O2 Device: Nasal Cannula O2 Flow Rate: O2 Flow Rate (L/min): 1 L/min   Palliative Care Assessment & Plan   Assessment/Recommendation/Plan  Establishing surrogate decision maker-daughter has been an active participant Recommend consideration of comfort/hospice care  Care plan was discussed with CCM team and nursing staff.  Thank you for allowing the Palliative Medicine Team to assist in the care of this patient.  Total time:  25 minutes  Time spent includes: Detailed review of medical records (labs, imaging, vital signs), medically appropriate exam (mental status, respiratory, cardiac, skin), discussed with treatment team, counseling and educating patient, family and staff, documenting clinical information, medication management and coordination of care.  Waddell Lesches, DNP, AGNP-C Palliative Medicine   Please contact Palliative Medicine Team phone at [phone] for questions and  concerns.

## 2024-05-10 NOTE — Progress Notes (Signed)
 Pt having bradycardia into 30s on monitor, frequent pauses, monitor showing afib, but p waves mostly present.  Pt stil lethargic, unchanged mentation.  [name], NP notified at bedside.  12- lead taken, in epic.  HR returned to prior shortly after this episode.

## 2024-05-10 NOTE — Plan of Care (Signed)
  Problem: Fluid Volume: Goal: Hemodynamic stability will improve Outcome: Progressing   Problem: Clinical Measurements: Goal: Diagnostic test results will improve Outcome: Progressing Goal: Signs and symptoms of infection will decrease Outcome: Progressing   Problem: Respiratory: Goal: Ability to maintain adequate ventilation will improve Outcome: Progressing   Problem: Education: Goal: Ability to describe self-care measures that may prevent or decrease complications (Diabetes Survival Skills Education) will improve Outcome: Progressing   Problem: Coping: Goal: Ability to adjust to condition or change in health will improve Outcome: Progressing   Problem: Fluid Volume: Goal: Ability to maintain a balanced intake and output will improve Outcome: Progressing   Problem: Health Behavior/Discharge Planning: Goal: Ability to identify and utilize available resources and services will improve Outcome: Progressing Goal: Ability to manage health-related needs will improve Outcome: Progressing   Problem: Metabolic: Goal: Ability to maintain appropriate glucose levels will improve Outcome: Progressing   Problem: Nutritional: Goal: Maintenance of adequate nutrition will improve Outcome: Progressing Goal: Progress toward achieving an optimal weight will improve Outcome: Progressing   Problem: Skin Integrity: Goal: Risk for impaired skin integrity will decrease Outcome: Progressing   Problem: Tissue Perfusion: Goal: Adequacy of tissue perfusion will improve Outcome: Progressing   Problem: Education: Goal: Knowledge of General Education information will improve Description: Including pain rating scale, medication(s)/side effects and non-pharmacologic comfort measures Outcome: Progressing   Problem: Health Behavior/Discharge Planning: Goal: Ability to manage health-related needs will improve Outcome: Progressing   Problem: Clinical Measurements: Goal: Ability to maintain  clinical measurements within normal limits will improve Outcome: Progressing Goal: Will remain free from infection Outcome: Progressing Goal: Diagnostic test results will improve Outcome: Progressing Goal: Respiratory complications will improve Outcome: Progressing Goal: Cardiovascular complication will be avoided Outcome: Progressing   Problem: Activity: Goal: Risk for activity intolerance will decrease Outcome: Progressing   Problem: Nutrition: Goal: Adequate nutrition will be maintained Outcome: Progressing   Problem: Coping: Goal: Level of anxiety will decrease Outcome: Progressing   Problem: Elimination: Goal: Will not experience complications related to bowel motility Outcome: Progressing Goal: Will not experience complications related to urinary retention Outcome: Progressing   Problem: Pain Managment: Goal: General experience of comfort will improve and/or be controlled Outcome: Progressing   Problem: Safety: Goal: Ability to remain free from injury will improve Outcome: Progressing   Problem: Skin Integrity: Goal: Risk for impaired skin integrity will decrease Outcome: Progressing

## 2024-05-10 NOTE — Care Management Important Message (Signed)
 Important Message  Patient Details  Name: Albert Figueroa MRN: [id number] Date of Birth: November 28, 1955   Important Message Given:  Yes - Medicare IM     Rojelio SHAUNNA Rattler 05/10/2024, 12:30 PM

## 2024-05-10 NOTE — Consult Note (Signed)
 PHARMACY CONSULT NOTE - ELECTROLYTES  Pharmacy Consult for Electrolyte Monitoring and Replacement   Recent Labs: Potassium (mmol/L)  Date Value  05/10/2024 3.4 (L)  10/06/2012 3.7   Magnesium  (mg/dL)  Date Value  91/88/7974 2.4   Calcium  (mg/dL)  Date Value  91/88/7974 7.9 (L)   Calcium , Total (mg/dL)  Date Value  98/92/7985 9.4   Albumin (g/dL)  Date Value  91/91/7974 2.5 (L)  10/06/2012 4.0   Phosphorus (mg/dL)  Date Value  92/70/7974 2.9   Sodium (mmol/L)  Date Value  05/10/2024 142  10/06/2012 137   Corrected Ca: 9.1 mg/dL  Height: 5' 11 (819.6 cm) Weight: 75.7 kg (166 lb 14.2 oz) IBW/kg (Calculated) : 75.3 Estimated Creatinine Clearance: 35 mL/min (A) (by C-G formula based on SCr of 2.18 mg/dL (H)).  Assessment  Albert Figueroa is a 68 y.o. male presenting with sepsis from pneumonia. PMH significant for T2DM, Lewy Body dementia, seizures, CKD stage 4. Pharmacy has been consulted to monitor and replace electrolytes.  Diet: nectar thick, aspiration precautions  MIVF: N/A Pertinent medications: insulin  aspart with meals  Goal of Therapy: Electrolytes within normal limits  Plan:  Give potassium chloride  40 mEq PO x 1  Continue to monitor electrolytes daily   Thank you for allowing pharmacy to be a part of this patient's care.  Tayra Dawe [place], PharmD Candidate  05/10/2024 8:01 AM

## 2024-05-10 NOTE — Progress Notes (Signed)
 .    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   On chart review, patient was recently discharged from the hospital on 04/28/24 following admission for AMS and multifactorial shock iso suspected polypharmacy, adrenal insufficiency vs autonomic dysfunction. Per ED reports, patient's home health nurse called EMS for altered mental status. On EMS arrival, patient was lethargic and able to answer only yes or no questions. Initial EMS BP 80/50, temp 101.5 axillary, HR 110, 96O2 on RA. 1000mg  tylenol  IV, 500NS bolus given in route.   ED Course: Initial vital signs showedBP 93/60  Pulse 100   Temp 100 F (37.8 C) (Oral)   Resp (!) 22  SpO2 (!) 88%, O2 @2L . Pertinent Labs/Diagnostics Findings: Na+/ K+:143/2.8 Glucose:85 BUN/Cr.:40/2.71 WBC:4.3 K/L Hgb/Hct:10.4/32.9 Plts: 76 PCT:16.12 Lactic acid:2.6  UA: rare bacteria COVID PCR: Negative,  VBG: pO2 pend; pCO2 52; pH 7.36;HCO3 29.4, %O2 Sat pend.  CXR> RUL pneumonia Patient given 30 cc/kg of fluids and started on broad-spectrum antibiotics Ceftriaxone  and Azityhromycin for sepsis with septic shock iso pneumonia. Patient admitted to [facility] service. However patient remained hypotensive despite IVF boluses therefore was started on Levophed . PCCM consulted.  Past Medical History  Arthritis  Type II Diabetes Mellitus  Emphysema/COPD  Epilepsy HLD HTN CKD Stage IV Lewy Body Dementia  Lumbar Stenosis  Spinal Meningitis  CSF Shunt  Seizure Tremors in both Hands  Significant Hospital Events   05/07/24:Admitted to [facility] service with sepsis  and acute toxic metabolic encephalopathy 2/2 pneumonia, developed recurrent hypotension requiring levophed  gtt. PCCM consulted  8/9 remains on biPAP, severe encephalopathy   8/10 remains on pressors 05/10/24-  CVP 9 this am, hypotensive in shock on pressors support via central line, confusion + tried to remove foley but despite this has improved UOP, on supplemental O2 1-2L/min, had 3 bradyarrythmic episodes without symptoms, his electrolytes were repleted. Im concerned about him receiving cefepime  with encephalopathy and seizure disorder we will refine therapy today, same with atarax  TID with dementia. We will initiate PT/OT if he can tolerate it   Consults:  PCCM  Procedures:  8/8: Left internal jugular cvc  Interim History / Subjective:    Remains critically ill DNR/DNI Remains on pressors CVP 5 plan for more pressors  Micro Data:  8/8: SARS-CoV-2 PCR> negative 8/8: Influenza PCR> negative 8/8: Blood culture x2> 8/8: MRSA PCR>>  8/8: Strep pneumo urinary antigen> 8/8: Legionella urinary antigen>  Antimicrobials:  Azithromycin  8/8>> Ceftriaxone  8/8>>  OBJECTIVE  Blood pressure (!) 118/99, pulse 63, temperature (!) 97.5 F (36.4 C), temperature source Oral, resp. rate 17, height 5' 11 (1.803 m), weight 75.7 kg, SpO2 91%. CVP:  [0 mmHg-23 mmHg] 4 mmHg      Intake/Output Summary (Last 24 hours) at 05/10/2024 0839 Last data filed at 05/10/2024 0800 Gross per 24 hour  Intake 1784.83 ml  Output 1275 ml  Net 509.83 ml   Filed Weights   05/07/24 1430  Weight: 75.7 kg      REVIEW OF SYSTEMS More awake  PHYSICAL EXAMINATION:  GENERAL:critically ill appearing EYES: Pupils equal, round, reactive to  light.  No scleral icterus.  MOUTH: Moist mucosal membrane. NECK: Supple.  PULMONARY: Lungs clear to auscultation CARDIOVASCULAR: S1 and S2.  Regular rate and rhythm GASTROINTESTINAL: Soft, nontender, -distended. Positive bowel sounds.  MUSCULOSKELETAL:  edema.   NEUROLOGIC: more awake SKIN:normal, warm to touch, Capillary refill delayed  Pulses present bilaterally   Labs/imaging that I havepersonally reviewed  (right click and Reselect all SmartList Selections daily)    DG Chest Port 1 View Result Date: 05/09/2024 CLINICAL DATA:  Pneumonia. EXAM: PORTABLE CHEST 1 VIEW COMPARISON:  05/07/2024 FINDINGS: Low volume film. The cardio pericardial silhouette is enlarged. Interval decrease in right upper lung airspace disease with probable layering left effusion. Left IJ central line tip overlies the proximal SVC level. Telemetry leads overlie the chest. IMPRESSION: Interval decrease in right upper lung airspace disease with probable layering left effusion. Electronically Signed   By: Camellia Candle M.D.   On: 05/09/2024 08:15    Labs   CBC: Recent Labs  Lab 05/07/24 1438 05/08/24 0437 05/10/24 0318  WBC 4.3 3.9* 5.8  NEUTROABS 3.3  --   --   HGB 10.4* 8.6* 7.9*  HCT 32.9* 26.0* 24.1*  MCV 102.5* 100.8* 98.8  PLT 76* 71* 54*   Basic Metabolic Panel: Recent Labs  Lab 05/07/24 1430 05/07/24 1438 05/08/24 0437 05/09/24 0404 05/10/24 0707  NA  --  143 142 141 142  K  --  2.8* 3.5 3.8 3.4*  CL  --  105 108 105 110  CO2  --  24 22 23 24   GLUCOSE  --  85 113* 129* 140*  BUN  --  40* 45* 50* 65*  CREATININE  --  2.71* 2.64* 2.26* 2.18*  CALCIUM   --  8.6* 7.8* 7.8* 7.9*  MG 1.7  --   --  1.6* 2.4   GFR: Estimated Creatinine Clearance: 35 mL/min (A) (by C-G formula based on SCr of 2.18 mg/dL (H)). Recent Labs  Lab 05/07/24 1438 05/07/24 1650 05/07/24 2015 05/08/24 0437 05/09/24 0407 05/10/24 0318  PROCALCITON  --   --  16.12 34.45 37.16  --   WBC 4.3  --   --  3.9*  --  5.8  LATICACIDVEN 2.6* 2.2*  --   --   --   --    Liver Function Tests: Recent Labs  Lab 05/07/24 1438  AST 24  ALT 10  ALKPHOS 63  BILITOT 1.3*  PROT 7.4  ALBUMIN 2.5*   Recent Labs  Lab 05/07/24 1438  LIPASE 22   Recent Labs  Lab 05/08/24 1059   AMMONIA 31    ABG    Component Value Date/Time   PHART 7.41 05/08/2024 0802   PCO2ART 34 05/08/2024 0802   PO2ART 97 05/08/2024 0802   HCO3 21.6 05/08/2024 0802   ACIDBASEDEF 2.4 (H) 05/08/2024 0802   O2SAT 99.1 05/08/2024 0802   Coagulation Profile: Recent Labs  Lab 05/07/24 1438  INR 1.3*   Cardiac Enzymes: No results for input(s): CKTOTAL, CKMB, CKMBINDEX, TROPONINI in the last 168 hours.  HbA1C: Hgb A1c MFr Bld  Date/Time Value Ref Range Status  01/16/2024 05:28 AM 4.2 (L) 4.8 - 5.6 % Final    Comment:    (NOTE) Pre diabetes:          5.7%-6.4%  Diabetes:              >6.4%  Glycemic control for   <7.0% adults with diabetes   09/01/2022 06:41 PM 5.0 4.8 - 5.6 % Final  Comment:    (NOTE)         Prediabetes: 5.7 - 6.4         Diabetes: >6.4         Glycemic control for adults with diabetes: <7.0     CBG: Recent Labs  Lab 05/09/24 0733 05/09/24 1122 05/09/24 1604 05/09/24 2215 05/10/24 0708  GLUCAP 120* 129* 170* 150* 128*   Review of Systems:   Unable to be obtained secondary to the patient's altered mental status.   Past Medical History  He,  has a past medical history of Arthritis, Diabetes mellitus without complication (HCC), Emphysema/COPD (HCC), Epilepsy (HCC), History of brain shunt, Hyperlipidemia, Hypertension, Kidney disease, Lewy body dementia (HCC), Lumbar stenosis, Meningitis spinal, Osteoarthrosis, Seizure (HCC), Stage 4 chronic kidney disease (HCC), and Tremor of both hands.   Surgical History    Past Surgical History:  Procedure Laterality Date   CATARACT EXTRACTION W/PHACO Right 05/14/2023   Procedure: CATARACT EXTRACTION PHACO AND INTRAOCULAR LENS PLACEMENT (IOC) RIGHT DIABETIC;  Surgeon: Mittie Gaskin, MD;  Location: [facility];  Service: Ophthalmology;  Laterality: Right;  6.70 0:39.8   CATARACT EXTRACTION W/PHACO Left 05/28/2023   Procedure: CATARACT EXTRACTION PHACO AND INTRAOCULAR LENS PLACEMENT  (IOC) LEFT DIABETIC 6.34 00:37.1;  Surgeon: Mittie Gaskin, MD;  Location: [facility];  Service: Ophthalmology;  Laterality: Left;   CSF SHUNT     placed in 1980s   KNEE SURGERY     multiple surgeries on both knees     Social History   reports that he has been smoking cigarettes. He has a 96 pack-year smoking history. He has never used smokeless tobacco. He reports that he does not drink alcohol  and does not use drugs.   Family History   His family history includes CAD in his father; Cancer in his mother; Diabetes in his mother.   Allergies Allergies  Allergen Reactions   Aricept  [Donepezil ] Other (See Comments)    SEVERE BRADYCARDIA WITH HEART BLOCK DO NOT RESTART AT DISCHARGE   Gabapentin  Other (See Comments)    Lethargic, Confused   Amlodipine     Other reaction(s): Other (see comments) Caused elevated blood pressure and headaches   Ozempic (0.25 Or 0.5 Mg-Dose) [Semaglutide(0.25 Or 0.5mg -Dos)]     Lethargy.  Also dropped blood sugars too low   Home Medications  Prior to Admission medications   Medication Sig Start Date End Date Taking? Authorizing Provider  acetaminophen  (TYLENOL ) 325 MG tablet Take 2 tablets (650 mg total) by mouth every 6 (six) hours as needed for mild pain (or Fever >/= 101). 09/07/22   Krishnan, Sendil K, MD  albuterol  (VENTOLIN  HFA) [phone] Base) MCG/ACT inhaler Inhale into the lungs every 6 (six) hours as needed for wheezing or shortness of breath.    [provider]  cyclobenzaprine  (FEXMID ) 7.5 MG tablet Take 1 tablet (7.5 mg total) by mouth 3 (three) times daily as needed for muscle spasms. Home med. 04/28/24   Awanda City, MD  dexlansoprazole (DEXILANT) 60 MG capsule Take 60 mg by mouth daily.    [provider]  divalproex  (DEPAKOTE  ER) 500 MG 24 hr tablet Take 1,000 mg by mouth in the morning and at bedtime.    [provider]  donepezil  (ARICEPT  ODT) 10 MG disintegrating tablet Take 10 mg by mouth at bedtime.     [provider]  feeding supplement (ENSURE PLUS HIGH PROTEIN) LIQD Take 237 mLs by mouth 3 (three) times daily between meals. 04/28/24  Awanda City, MD  fenofibrate  160 MG tablet Take 160 mg by mouth daily. 04/25/22   [provider]  ferrous sulfate  325 (65 FE) MG tablet Take 325 mg by mouth daily with breakfast.    [provider]  Fluticasone -Umeclidin-Vilant (TRELEGY ELLIPTA) 100-62.5-25 MCG/ACT AEPB Inhale into the lungs daily.    [provider]  hydrOXYzine  (ATARAX ) 10 MG tablet Take 10 mg by mouth 3 (three) times daily as needed.    [provider]  lidocaine  (LIDODERM ) 5 % Place 1 patch onto the skin daily. 04/05/24   [provider]  midodrine  (PROAMATINE ) 2.5 MG tablet Take 1 tablet (2.5 mg total) by mouth 3 (three) times daily with meals. 04/28/24   Awanda City, MD  Multiple Vitamin (MULTIVITAMIN WITH MINERALS) TABS tablet Take 1 tablet by mouth daily. 04/29/24   Awanda City, MD  naloxone  (NARCAN ) nasal spray 4 mg/0.1 mL Place 1 spray into the nose once as needed for up to 1 dose. 04/28/24   Awanda City, MD  Oxycodone  HCl 20 MG TABS Take 0.5 tablets (10 mg total) by mouth every 4 (four) hours as needed. Home med.  Reduced from 20 mg. 04/28/24   Awanda City, MD  polyethylene glycol (MIRALAX  / GLYCOLAX ) 17 g packet Take 17 g by mouth daily. 04/28/24   Awanda City, MD  rosuvastatin  (CRESTOR ) 20 MG tablet Take 20 mg by mouth at bedtime. 04/08/22   [provider]  Scheduled Meds:  budesonide -glycopyrrolate -formoterol   2 puff Inhalation BID   Chlorhexidine  Gluconate Cloth  6 each Topical Daily   feeding supplement  237 mL Oral TID BM   ferrous sulfate   325 mg Oral Q breakfast   hydrocortisone  sod succinate (SOLU-CORTEF ) inj  50 mg Intravenous Q8H   insulin  aspart  0-5 Units Subcutaneous QHS   insulin  aspart  0-9 Units Subcutaneous TID WC   ipratropium-albuterol   3 mL Nebulization BID   midodrine   5 mg Oral TID WC   mouth rinse  15 mL Mouth  Rinse 4 times per day   pantoprazole   40 mg Oral Daily   rosuvastatin   20 mg Oral QHS   sodium chloride  flush  10-40 mL Intracatheter Q12H   Continuous Infusions:  azithromycin  Stopped (05/09/24 1756)   ceFEPime  (MAXIPIME ) IV Stopped (05/09/24 2219)   norepinephrine  (LEVOPHED ) Adult infusion 2 mcg/min (05/10/24 0757)   valproate sodium  Stopped (05/10/24 0630)   PRN Meds:.acetaminophen  **OR** acetaminophen , cyclobenzaprine , diazepam , hydrOXYzine , nicotine , ondansetron  **OR** ondansetron  (ZOFRAN ) IV, mouth rinse, senna-docusate, sodium chloride  flush   Assessment & Plan:   68 yo white male with severe acute hypoxic resp failure due to COPD exacerbation and severe RT sided pneumonia  SEPTIC shock SOURCE-pneumonia -use vasopressors to keep MAP>65 as needed -follow ABG and LA as needed -follow up cultures -emperic ABX -consider stress dose steroids -aggressive IV fluid Resuscitation  SEVERE COPD EXACERBATION -continue IV steroids as prescribed -continue NEB THERAPY as prescribed -wean fio2 as needed and tolerated   Severe ACUTE Hypoxic and Hypercapnic Respiratory Failure - Intermittent chest x-ray & ABG PRN - Ensure adequate pulmonary hygiene  biPAP as needed Wean fio2 as tolerated  CARDIAC -Echocardiogram 04/17/24: LVEF 60-65%, grade I DD, RV systolic function is normal, RV size normal -Vasopressors as needed to maintain MAP goal  -Continue Midodrine  10 mg TID -start Stress Dose Steroids -trend lactate -HS Troponin is normal  Acute  Metabolic Encephalopathy  Seizure disorder Chronic pain  Recurrent falls  Hx: Lewy body dementia, meningitis s/p VP shunt, and epilepsy  -  Consider CT head if no improvement in mental status -seizure precautions -Continue Depakote  High risk for aspiration   ACUTE KIDNEY INJURY/Renal Failure -continue Foley Catheter-assess need -Avoid nephrotoxic agents -Follow urine output, BMP -Ensure adequate renal perfusion, optimize  oxygenation -Renal dose medications   Intake/Output Summary (Last 24 hours) at 05/10/2024 0839 Last data filed at 05/10/2024 0800 Gross per 24 hour  Intake 1784.83 ml  Output 1275 ml  Net 509.83 ml      Latest Ref Rng & Units 05/10/2024    7:07 AM 05/09/2024    4:04 AM 05/08/2024    4:37 AM  BMP  Glucose 70 - 99 mg/dL 859  870  886   BUN 8 - 23 mg/dL 65  50  45   Creatinine 0.61 - 1.24 mg/dL 7.81  7.73  7.35   Sodium 135 - 145 mmol/L 142  141  142   Potassium 3.5 - 5.1 mmol/L 3.4  3.8  3.5   Chloride 98 - 111 mmol/L 110  105  108   CO2 22 - 32 mmol/L 24  23  22    Calcium  8.9 - 10.3 mg/dL 7.9  7.8  7.8     Bone marrow suppression      - related to anti epileptic drugs  ENDO - ICU hypoglycemic\Hyperglycemia protocol -check FSBS per protocol   GI GI PROPHYLAXIS as indicated NUTRITIONAL STATUS DIET-->NPO, OBTAIN SPEECH THERAPY  Constipation protocol as indicated   ELECTROLYTES -follow labs as needed -replace as needed -pharmacy consultation and following  RESTRICTIVE TRANSFUSION PROTOCOL TRANSFUSION  IF HGB<7  or ACTIVE BLEEDING OR DX of ACUTE CORONARY SYNDROMES     Best practice:  Diet:  NPO Pain/Anxiety/Delirium protocol (if indicated): No VAP protocol (if indicated): Not indicated DVT prophylaxis: LMWH GI prophylaxis: PPI Glucose control:  SSI Yes Central venous access:  Yes, and it is still needed Arterial line:  N/A Foley:  Yes, and it is still needed Mobility:  bed rest  PT consulted: N/A Last date of multidisciplinary goals of care discussion []  Code Status:  DNR Disposition: ICU   = Goals of Care = Code Status Order: DNR  Primary Emergency Contact: Lynn,[name] Patient wishes to pursue ongoing treatment, but concurred that if deteriorated to pulselessness, patient would prefer a natural death as opposed to invasive measures such as CPR and intubation.  Family should be immediately contacted in such situations.     Critical care provider  statement:   Total critical care time: 33 minutes   Performed by: Parris MD   Critical care time was exclusive of separately billable procedures and treating other patients.   Critical care was necessary to treat or prevent imminent or life-threatening deterioration.   Critical care was time spent personally by me on the following activities: development of treatment plan with patient and/or surrogate as well as nursing, discussions with consultants, evaluation of patient's response to treatment, examination of patient, obtaining history from patient or surrogate, ordering and performing treatments and interventions, ordering and review of laboratory studies, ordering and review of radiographic studies, pulse oximetry and re-evaluation of patient's condition.    Garion Wempe, M.D.  Pulmonary & Critical Care Medicine

## 2024-05-11 ENCOUNTER — Other Ambulatory Visit: Payer: Self-pay

## 2024-05-11 ENCOUNTER — Inpatient Hospital Stay

## 2024-05-11 DIAGNOSIS — Z7189 Other specified counseling: Secondary | ICD-10-CM | POA: Diagnosis not present

## 2024-05-11 DIAGNOSIS — Z515 Encounter for palliative care: Secondary | ICD-10-CM | POA: Diagnosis not present

## 2024-05-11 DIAGNOSIS — R627 Adult failure to thrive: Secondary | ICD-10-CM | POA: Diagnosis not present

## 2024-05-11 DIAGNOSIS — E440 Moderate protein-calorie malnutrition: Secondary | ICD-10-CM | POA: Insufficient documentation

## 2024-05-11 LAB — CBC
HCT: 24.2 % — ABNORMAL LOW (ref 39.0–52.0)
Hemoglobin: 7.7 g/dL — ABNORMAL LOW (ref 13.0–17.0)
MCH: 32.6 pg (ref 26.0–34.0)
MCHC: 31.8 g/dL (ref 30.0–36.0)
MCV: 102.5 fL — ABNORMAL HIGH (ref 80.0–100.0)
Platelets: 41 K/uL — ABNORMAL LOW (ref 150–400)
RBC: 2.36 MIL/uL — ABNORMAL LOW (ref 4.22–5.81)
RDW: 20 % — ABNORMAL HIGH (ref 11.5–15.5)
WBC: 4.6 K/uL (ref 4.0–10.5)
nRBC: 0 % (ref 0.0–0.2)

## 2024-05-11 LAB — GLUCOSE, CAPILLARY
Glucose-Capillary: 143 mg/dL — ABNORMAL HIGH (ref 70–99)
Glucose-Capillary: 134 mg/dL — ABNORMAL HIGH (ref 70–99)
Glucose-Capillary: 106 mg/dL — ABNORMAL HIGH (ref 70–99)
Glucose-Capillary: 109 mg/dL — ABNORMAL HIGH (ref 70–99)
Glucose-Capillary: 120 mg/dL — ABNORMAL HIGH (ref 70–99)

## 2024-05-11 LAB — BASIC METABOLIC PANEL WITH GFR
Anion gap: 5 (ref 5–15)
BUN: 80 mg/dL — ABNORMAL HIGH (ref 8–23)
CO2: 25 mmol/L (ref 22–32)
Calcium: 8.3 mg/dL — ABNORMAL LOW (ref 8.9–10.3)
Chloride: 116 mmol/L — ABNORMAL HIGH (ref 98–111)
Creatinine, Ser: 2.23 mg/dL — ABNORMAL HIGH (ref 0.61–1.24)
GFR, Estimated: 32 mL/min — ABNORMAL LOW (ref >60)
Glucose, Bld: 163 mg/dL — ABNORMAL HIGH (ref 70–99)
Potassium: 3.4 mmol/L — ABNORMAL LOW (ref 3.5–5.1)
Sodium: 146 mmol/L — ABNORMAL HIGH (ref 135–145)

## 2024-05-11 LAB — PHOSPHORUS: Phosphorus: 3.5 mg/dL (ref 2.5–4.6)

## 2024-05-11 LAB — MAGNESIUM: Magnesium: 2.7 mg/dL — ABNORMAL HIGH (ref 1.7–2.4)

## 2024-05-11 MED ORDER — INSULIN ASPART 100 UNIT/ML IJ SOLN
0.0000 [IU] | INTRAMUSCULAR | Status: DC
  Administered 2024-05-11 – 2024-05-13 (×13): 1 [IU] via SUBCUTANEOUS
  Filled 2024-05-11 (×7): qty 1

## 2024-05-11 MED ORDER — POTASSIUM CHLORIDE 10 MEQ/100ML IV SOLN
10.0000 meq | INTRAVENOUS | Status: AC
  Administered 2024-05-11 (×4): 10 meq via INTRAVENOUS
  Filled 2024-05-11 (×2): qty 100

## 2024-05-11 MED ORDER — VALPROATE SODIUM 100 MG/ML IV SOLN
500.0000 mg | Freq: Four times a day (QID) | INTRAVENOUS | Status: DC
  Administered 2024-05-11 – 2024-05-12 (×8): 500 mg via INTRAVENOUS
  Filled 2024-05-11 (×5): qty 5

## 2024-05-11 MED ORDER — POTASSIUM CHLORIDE 20 MEQ PO PACK: 40.0000 meq | PACK | Freq: Once | ORAL | Status: DC

## 2024-05-11 MED ORDER — SODIUM CHLORIDE 0.9 % IV SOLN
2.0000 g | INTRAVENOUS | Status: DC
  Administered 2024-05-11 – 2024-05-13 (×5): 2 g via INTRAVENOUS
  Filled 2024-05-11 (×3): qty 20

## 2024-05-11 MED ORDER — VALPROATE SODIUM 100 MG/ML IV SOLN
500.0000 mg | Freq: Once | INTRAVENOUS | Status: AC
  Administered 2024-05-11 (×2): 500 mg via INTRAVENOUS
  Filled 2024-05-11: qty 5

## 2024-05-11 NOTE — Progress Notes (Signed)
 SLP Cancellation Note  Patient Details Name: Albert Figueroa MRN: [id number] DOB: May 08, 1956   Cancelled treatment:       Reason Eval/Treat Not Completed: Medical issues which prohibited therapy;Patient's level of consciousness (per NSG report and chart review, pt has had a decline in status)  Pt had a suspected aspiration event w/ oral intake last night followed by Pulmonary decline, per NSG report. Met w/ NSG, Palliative Care, and Medical Team re: pt's status. In setting of pt's inconsistent and declined medical/Pulmonary status as well as waxing/waning alertness/engagement, pt's risk for Aspiration is deemed HIGH. Oral intake would present risk for aspiration/aspiration pneumonia despite aspiration precautions in place/by NSG.  Consulted Medical Team re: the recommendation for continued NPO status and discussion of GOC including long term alternative means of feeding(PEG) w/ POA d/t pt's risk of aspiration/aspiration pneumonia and to meet pt's Nutrition/hydration needs(declined at Baseline per Dietician). Recommend frequent oral care for hygiene and stimulation of swallowing.  ST services can be available for further education/concerns if needed while pt is admitted. F/u at next venue of care is recommended per POC.      Comer Portugal, MS, CCC-SLP Speech Language Pathologist Rehab Services; [facility] [phone] (ascom) Naya Ilagan 05/11/2024, 10:13 AM

## 2024-05-11 NOTE — Progress Notes (Signed)
 .    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   On chart review, patient was recently discharged from the hospital on 04/28/24 following admission for AMS and multifactorial shock iso suspected polypharmacy, adrenal insufficiency vs autonomic dysfunction. Per ED reports, patient's home health nurse called EMS for altered mental status. On EMS arrival, patient was lethargic and able to answer only yes or no questions. Initial EMS BP 80/50, temp 101.5 axillary, HR 110, 96O2 on RA. 1000mg  tylenol  IV, 500NS bolus given in route.    Past Medical History  Arthritis  Type II Diabetes Mellitus  Emphysema/COPD  Epilepsy HLD HTN CKD Stage IV Lewy Body Dementia  Lumbar Stenosis  Spinal Meningitis  CSF Shunt  Seizure Tremors in both Hands  Significant Hospital Events   05/07/24:Admitted to [facility] service with sepsis and acute toxic metabolic encephalopathy 2/2 pneumonia, developed recurrent hypotension requiring levophed  gtt. PCCM consulted  8/9 remains on biPAP, severe encephalopathy FiO2 (%):  [24 %] 24 % 8/10 remains on pressors 05/10/24-  CVP 9 this am, hypotensive in shock on pressors support via central line, confusion + tried to remove foley but despite this has improved UOP, on supplemental O2 1-2L/min, had 3 bradyarrythmic episodes without symptoms, his electrolytes were repleted. Im concerned about him receiving cefepime  with encephalopathy and seizure disorder we will refine therapy today, same with atarax  TID with dementia. We will initiate PT/OT if he can tolerate it 05/11/24-  overnight worsening encephalopathy possible aspiration event currently on 5L/min.  We are removing foley today.  Palliative is meeting with family for goals of care.   Consults:  PCCM  Procedures:  8/8: Left internal jugular cvc  Interim History / Subjective:    Remains critically ill DNR/DNI Remains on pressors CVP 5 plan for more pressors  Micro Data:  8/8: SARS-CoV-2 PCR> negative 8/8: Influenza PCR> negative 8/8: Blood culture x2> 8/8: MRSA PCR>>  8/8: Strep pneumo urinary antigen> 8/8: Legionella urinary antigen>  Antimicrobials:  Azithromycin  8/8>> Ceftriaxone  8/8>>  OBJECTIVE  Blood pressure 99/61, pulse 66, temperature (!) 97.4 F (36.3 C), temperature source Axillary, resp. rate 19, height 5' 11 (1.803 m), weight 75.7 kg, SpO2 95%. CVP:  [0 mmHg-11 mmHg] 6 mmHg  FiO2 (%):  [24 %] 24 %   Intake/Output Summary (Last 24 hours) at 05/11/2024 0908 Last data filed at 05/11/2024 0725 Gross per 24 hour  Intake 605 ml  Output 1100 ml  Net -495 ml   Filed Weights   05/07/24 1430  Weight: 75.7 kg      REVIEW OF SYSTEMS More awake  PHYSICAL EXAMINATION:  GENERAL:critically ill appearing EYES: Pupils equal, round, reactive to light.  No scleral icterus.  MOUTH: Moist mucosal membrane. NECK: Supple.  PULMONARY: Lungs clear to auscultation CARDIOVASCULAR: S1 and S2.  Regular rate and rhythm GASTROINTESTINAL: Soft, nontender, -distended. Positive bowel sounds.  MUSCULOSKELETAL:  edema.  NEUROLOGIC: more awake SKIN:normal, warm to touch, Capillary refill delayed  Pulses present bilaterally   Labs/imaging that I havepersonally reviewed  (right click and Reselect all SmartList Selections daily)  DG Chest Port 1 View Result Date: 05/11/2024 CLINICAL DATA:  Suspected pulmonary aspiration of food. EXAM: PORTABLE CHEST 1 VIEW COMPARISON:  May 09, 2024. FINDINGS: Stable cardiomediastinal silhouette. Stable right upper lobe opacity concerning for pneumonia.  Left lung is clear. Hypoinflation of the lungs is noted. Left internal jugular catheter is unchanged. Bony thorax is unremarkable. IMPRESSION: Hypoinflation of the lungs. Stable right upper lobe opacity concerning for pneumonia. Followup PA and lateral chest X-ray is recommended in 3-4 weeks following trial of antibiotic therapy to ensure resolution and exclude underlying malignancy. Electronically Signed   By: Lynwood Landy Raddle M.D.   On: 05/11/2024 08:43    Labs   CBC: Recent Labs  Lab 05/07/24 1438 05/08/24 0437 05/10/24 0318 05/11/24 0515  WBC 4.3 3.9* 5.8 4.6  NEUTROABS 3.3  --   --   --   HGB 10.4* 8.6* 7.9* 7.7*  HCT 32.9* 26.0* 24.1* 24.2*  MCV 102.5* 100.8* 98.8 102.5*  PLT 76* 71* 54* 41*   Basic Metabolic Panel: Recent Labs  Lab 05/07/24 1430 05/07/24 1438 05/08/24 0437 05/09/24 0404 05/10/24 0707 05/11/24 0515  NA  --  143 142 141 142 146*  K  --  2.8* 3.5 3.8 3.4* 3.4*  CL  --  105 108 105 110 116*  CO2  --  24 22 23 24 25   GLUCOSE  --  85 113* 129* 140* 163*  BUN  --  40* 45* 50* 65* 80*  CREATININE  --  2.71* 2.64* 2.26* 2.18* 2.23*  CALCIUM   --  8.6* 7.8* 7.8* 7.9* 8.3*  MG 1.7  --   --  1.6* 2.4 2.7*  PHOS  --   --   --   --   --  3.5   GFR: Estimated Creatinine Clearance: 34.2 mL/min (A) (by C-G formula based on SCr of 2.23 mg/dL (H)). Recent Labs  Lab 05/07/24 1438 05/07/24 1650 05/07/24 2015 05/08/24 0437 05/09/24 0407 05/10/24 0318 05/11/24 0515  PROCALCITON  --   --  16.12 34.45 37.16  --   --   WBC 4.3  --   --  3.9*  --  5.8 4.6  LATICACIDVEN 2.6* 2.2*  --   --   --   --   --    Liver Function Tests: Recent Labs  Lab 05/07/24 1438  AST 24  ALT 10  ALKPHOS 63  BILITOT 1.3*  PROT 7.4  ALBUMIN 2.5*   Recent Labs  Lab 05/07/24 1438  LIPASE 22   Recent Labs  Lab 05/08/24 1059  AMMONIA 31    ABG    Component Value Date/Time   PHART 7.41 05/08/2024 0802   PCO2ART 34 05/08/2024 0802   PO2ART 97 05/08/2024 0802   HCO3 21.6  05/08/2024 0802   ACIDBASEDEF 2.4 (H) 05/08/2024 0802   O2SAT 99.1 05/08/2024 0802   Coagulation Profile: Recent Labs  Lab 05/07/24 1438  INR 1.3*   Cardiac Enzymes: No results for input(s): CKTOTAL, CKMB, CKMBINDEX, TROPONINI in the last 168 hours.  HbA1C: Hgb A1c MFr Bld  Date/Time Value Ref Range Status  01/16/2024 05:28 AM 4.2 (L) 4.8 - 5.6 % Final    Comment:    (NOTE) Pre diabetes:          5.7%-6.4%  Diabetes:              >6.4%  Glycemic control for   <7.0% adults with diabetes   09/01/2022 06:41 PM 5.0 4.8 - 5.6 % Final  Comment:    (NOTE)         Prediabetes: 5.7 - 6.4         Diabetes: >6.4         Glycemic control for adults with diabetes: <7.0     CBG: Recent Labs  Lab 05/10/24 0708 05/10/24 1133 05/10/24 1638 05/10/24 2107 05/11/24 0727  GLUCAP 128* 148* 154* 184* 143*   Review of Systems:   Unable to be obtained secondary to the patient's altered mental status.   Past Medical History  He,  has a past medical history of Arthritis, Diabetes mellitus without complication (HCC), Emphysema/COPD (HCC), Epilepsy (HCC), History of brain shunt, Hyperlipidemia, Hypertension, Kidney disease, Lewy body dementia (HCC), Lumbar stenosis, Meningitis spinal, Osteoarthrosis, Seizure (HCC), Stage 4 chronic kidney disease (HCC), and Tremor of both hands.   Surgical History    Past Surgical History:  Procedure Laterality Date   CATARACT EXTRACTION W/PHACO Right 05/14/2023   Procedure: CATARACT EXTRACTION PHACO AND INTRAOCULAR LENS PLACEMENT (IOC) RIGHT DIABETIC;  Surgeon: Mittie Gaskin, MD;  Location: [facility];  Service: Ophthalmology;  Laterality: Right;  6.70 0:39.8   CATARACT EXTRACTION W/PHACO Left 05/28/2023   Procedure: CATARACT EXTRACTION PHACO AND INTRAOCULAR LENS PLACEMENT (IOC) LEFT DIABETIC 6.34 00:37.1;  Surgeon: Mittie Gaskin, MD;  Location: [facility];  Service: Ophthalmology;  Laterality: Left;   CSF SHUNT      placed in 1980s   KNEE SURGERY     multiple surgeries on both knees     Social History   reports that he has been smoking cigarettes. He has a 96 pack-year smoking history. He has never used smokeless tobacco. He reports that he does not drink alcohol  and does not use drugs.   Family History   His family history includes CAD in his father; Cancer in his mother; Diabetes in his mother.   Allergies Allergies  Allergen Reactions   Aricept  [Donepezil ] Other (See Comments)    SEVERE BRADYCARDIA WITH HEART BLOCK DO NOT RESTART AT DISCHARGE   Gabapentin  Other (See Comments)    Lethargic, Confused   Amlodipine     Other reaction(s): Other (see comments) Caused elevated blood pressure and headaches   Ozempic (0.25 Or 0.5 Mg-Dose) [Semaglutide(0.25 Or 0.5mg -Dos)]     Lethargy.  Also dropped blood sugars too low   Home Medications  Prior to Admission medications   Medication Sig Start Date End Date Taking? Authorizing Provider  acetaminophen  (TYLENOL ) 325 MG tablet Take 2 tablets (650 mg total) by mouth every 6 (six) hours as needed for mild pain (or Fever >/= 101). 09/07/22   Krishnan, Sendil K, MD  albuterol  (VENTOLIN  HFA) 108 (90 Base) MCG/ACT inhaler Inhale into the lungs every 6 (six) hours as needed for wheezing or shortness of breath.    [provider]  cyclobenzaprine  (FEXMID ) 7.5 MG tablet Take 1 tablet (7.5 mg total) by mouth 3 (three) times daily as needed for muscle spasms. Home med. 04/28/24   Awanda City, MD  dexlansoprazole (DEXILANT) 60 MG capsule Take 60 mg by mouth daily.    [provider]  divalproex  (DEPAKOTE  ER) 500 MG 24 hr tablet Take 1,000 mg by mouth in the morning and at bedtime.    [provider]  donepezil  (ARICEPT  ODT) 10 MG disintegrating tablet Take 10 mg by mouth at bedtime.    [provider]  feeding supplement (ENSURE PLUS HIGH PROTEIN) LIQD Take 237 mLs by mouth 3 (three) times daily between meals. 04/28/24  Awanda City,  MD  fenofibrate  160 MG tablet Take 160 mg by mouth daily. 04/25/22   [provider]  ferrous sulfate  325 (65 FE) MG tablet Take 325 mg by mouth daily with breakfast.    [provider]  Fluticasone -Umeclidin-Vilant (TRELEGY ELLIPTA) 100-62.5-25 MCG/ACT AEPB Inhale into the lungs daily.    [provider]  hydrOXYzine  (ATARAX ) 10 MG tablet Take 10 mg by mouth 3 (three) times daily as needed.    [provider]  lidocaine  (LIDODERM ) 5 % Place 1 patch onto the skin daily. 04/05/24   [provider]  midodrine  (PROAMATINE ) 2.5 MG tablet Take 1 tablet (2.5 mg total) by mouth 3 (three) times daily with meals. 04/28/24   Awanda City, MD  Multiple Vitamin (MULTIVITAMIN WITH MINERALS) TABS tablet Take 1 tablet by mouth daily. 04/29/24   Awanda City, MD  naloxone  (NARCAN ) nasal spray 4 mg/0.1 mL Place 1 spray into the nose once as needed for up to 1 dose. 04/28/24   Awanda City, MD  Oxycodone  HCl 20 MG TABS Take 0.5 tablets (10 mg total) by mouth every 4 (four) hours as needed. Home med.  Reduced from 20 mg. 04/28/24   Awanda City, MD  polyethylene glycol (MIRALAX  / GLYCOLAX ) 17 g packet Take 17 g by mouth daily. 04/28/24   Awanda City, MD  rosuvastatin  (CRESTOR ) 20 MG tablet Take 20 mg by mouth at bedtime. 04/08/22   [provider]  Scheduled Meds:  budesonide -glycopyrrolate -formoterol   2 puff Inhalation BID   Chlorhexidine  Gluconate Cloth  6 each Topical Daily   feeding supplement (NEPRO CARB STEADY)  237 mL Oral TID BM   ferrous sulfate   325 mg Oral Q breakfast   fludrocortisone   0.05 mg Oral Daily   hydrocortisone  sod succinate (SOLU-CORTEF ) inj  50 mg Intravenous Q8H   insulin  aspart  0-5 Units Subcutaneous QHS   insulin  aspart  0-9 Units Subcutaneous TID WC   ipratropium-albuterol   3 mL Nebulization BID   midodrine   5 mg Oral TID WC   mouth rinse  15 mL Mouth Rinse 4 times per day   pantoprazole   40 mg Oral Daily   rosuvastatin   20 mg Oral QHS   sodium  chloride flush  10-40 mL Intracatheter Q12H   valproic  acid  500 mg Oral Q6H   Continuous Infusions:  cefTRIAXone  (ROCEPHIN )  IV     potassium chloride  10 mEq (05/11/24 0828)   PRN Meds:.acetaminophen  **OR** acetaminophen , cyclobenzaprine , hydrOXYzine , nicotine , ondansetron  **OR** ondansetron  (ZOFRAN ) IV, mouth rinse, oxyCODONE , senna-docusate, sodium chloride  flush   Assessment & Plan:   68 yo white male with severe acute hypoxic resp failure due to COPD exacerbation and severe RT sided pneumonia  SEPTIC shock SOURCE-pneumonia -use vasopressors to keep MAP>65 as needed -follow ABG and LA as needed -follow up cultures -emperic ABX -consider stress dose steroids -aggressive IV fluid Resuscitation  SEVERE COPD EXACERBATION -continue IV steroids as prescribed -continue NEB THERAPY as prescribed -wean fio2 as needed and tolerated   Severe ACUTE Hypoxic and Hypercapnic Respiratory Failure - Intermittent chest x-ray & ABG PRN - Ensure adequate pulmonary hygiene  biPAP as needed Wean fio2 as tolerated  CARDIAC -Echocardiogram 04/17/24: LVEF 60-65%, grade I DD, RV systolic function is normal, RV size normal -Vasopressors as needed to maintain MAP goal  -Continue Midodrine  10 mg TID -start Stress Dose Steroids -trend lactate -HS Troponin is normal  Acute  Metabolic Encephalopathy  Seizure disorder Chronic pain  Recurrent falls  Hx: Lewy body dementia,  meningitis s/p VP shunt, and epilepsy  -Consider CT head if no improvement in mental status -seizure precautions -Continue Depakote  High risk for aspiration   ACUTE KIDNEY INJURY/Renal Failure -continue Foley Catheter-assess need -Avoid nephrotoxic agents -Follow urine output, BMP -Ensure adequate renal perfusion, optimize oxygenation -Renal dose medications   Intake/Output Summary (Last 24 hours) at 05/11/2024 0908 Last data filed at 05/11/2024 0725 Gross per 24 hour  Intake 605 ml  Output 1100 ml  Net -495 ml       Latest Ref Rng & Units 05/11/2024    5:15 AM 05/10/2024    7:07 AM 05/09/2024    4:04 AM  BMP  Glucose 70 - 99 mg/dL 836  859  870   BUN 8 - 23 mg/dL 80  65  50   Creatinine 0.61 - 1.24 mg/dL 7.76  7.81  7.73   Sodium 135 - 145 mmol/L 146  142  141   Potassium 3.5 - 5.1 mmol/L 3.4  3.4  3.8   Chloride 98 - 111 mmol/L 116  110  105   CO2 22 - 32 mmol/L 25  24  23    Calcium  8.9 - 10.3 mg/dL 8.3  7.9  7.8     Bone marrow suppression      - related to anti epileptic drugs  ENDO - ICU hypoglycemic\Hyperglycemia protocol -check FSBS per protocol   GI GI PROPHYLAXIS as indicated NUTRITIONAL STATUS DIET-->NPO, OBTAIN SPEECH THERAPY  Constipation protocol as indicated   ELECTROLYTES -follow labs as needed -replace as needed -pharmacy consultation and following  RESTRICTIVE TRANSFUSION PROTOCOL TRANSFUSION  IF HGB<7  or ACTIVE BLEEDING OR DX of ACUTE CORONARY SYNDROMES     Best practice:  Diet:  NPO Pain/Anxiety/Delirium protocol (if indicated): No VAP protocol (if indicated): Not indicated DVT prophylaxis: LMWH GI prophylaxis: PPI Glucose control:  SSI Yes Central venous access:  Yes, and it is still needed Arterial line:  N/A Foley:  Yes, and it is still needed Mobility:  bed rest  PT consulted: N/A Last date of multidisciplinary goals of care discussion []  Code Status:  DNR Disposition: ICU   = Goals of Care = Code Status Order: DNR  Primary Emergency Contact: Lynn,[name] Patient wishes to pursue ongoing treatment, but concurred that if deteriorated to pulselessness, patient would prefer a natural death as opposed to invasive measures such as CPR and intubation.  Family should be immediately contacted in such situations.     Critical care provider statement:   Total critical care time: 33 minutes   Performed by: Parris MD   Critical care time was exclusive of separately billable procedures and treating other patients.   Critical care was  necessary to treat or prevent imminent or life-threatening deterioration.   Critical care was time spent personally by me on the following activities: development of treatment plan with patient and/or surrogate as well as nursing, discussions with consultants, evaluation of patient's response to treatment, examination of patient, obtaining history from patient or surrogate, ordering and performing treatments and interventions, ordering and review of laboratory studies, ordering and review of radiographic studies, pulse oximetry and re-evaluation of patient's condition.    Aristides Luckey, M.D.  Pulmonary & Critical Care Medicine

## 2024-05-11 NOTE — Progress Notes (Signed)
 Initial Nutrition Assessment  DOCUMENTATION CODES:   Non-severe (moderate) malnutrition in context of chronic illness  INTERVENTION:   RD will monitor for diet advancement versus the need for nutrition support  RD will monitor for GOC  If feeding tube placed, recommend:  Osmolite 1.5@60ml /hr- Initiate at 49ml/hr and increase by 10ml/hr q 8 hours until goal rate is reached.   ProSource TF 20- Give 60ml daily via tube, each supplement provides 80kcal and 20g of protein.   Free water flushes q4 hours   Regimen provides 2240kcal/day, 110g/day protein and 2297ml/day of free water.   Pt at high refeed risk; recommend monitor potassium, magnesium  and phosphorus labs daily until stable  Juven Fruit Punch BID via tube, each serving provides 95kcal and 2.5g of protein (amino acids glutamine and arginine)  Daily weights   NUTRITION DIAGNOSIS:   Moderate Malnutrition related to chronic illness as evidenced by moderate fat depletion, moderate muscle depletion.  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  Visited pt's room today. Pt lethargic and does not provide any history. Pt eating 30-90% of meals in hospital. Pt seen by SLP this morning and was made NPO for aspiration risk. SLP recommending consideration of G-tube placement. Palliative care following for GOC. Family will need to decide about feeding tube placement. Pt is at high refeed risk. Per chart, pt is down 33lbs(17%) from his  UBW; RD unsure how recently weight loss occurred. Pt does meet criteria for  malnutrition today.     Medications reviewed and include: ferrous sulfate , solu-medrol, insulin , midodrine , protonix , ceftriaxone   Labs reviewed: Na 146(H), K 3.4(L), BUN 80(H), creat 2.23(H). P 3.5 wnl, Mg 2.7(H) Vitamin D - 24.30(L)- 4/2 Hgb 7.7(L), Hct 24.2(L) Cbgs- 143, 184, 154, 148, 128 x 24 hrs  AIC 4.2(l)- 4/18  UOP-   NUTRITION - FOCUSED PHYSICAL EXAM:  Flowsheet Row Most Recent Value  Orbital Region Mild depletion  Upper Arm Region Moderate depletion  Thoracic and Lumbar Region No depletion  Buccal Region Mild depletion  Temple Region Moderate depletion  Clavicle Bone Region Moderate depletion  Clavicle and Acromion Bone Region Moderate depletion  Scapular Bone Region Moderate depletion  Dorsal Hand Mild depletion  Patellar Region Severe depletion  Anterior Thigh Region Severe depletion  Posterior Calf Region Severe depletion  Edema (RD Assessment) Mild  Hair Reviewed  Eyes Reviewed  Mouth Reviewed  Skin Reviewed  Nails Reviewed   Diet Order:   Diet Order             Diet NPO time specified  Diet effective now                  EDUCATION NEEDS:   No education needs have been identified at this time  Skin:  Skin Assessment: Reviewed RN Assessment (Stage III elbow)  Last BM:  8/11- type 4  Height:   Ht Readings from Last 1 Encounters:  05/07/24 5' 11 (1.803 m)    Weight:   Wt Readings from Last 1 Encounters:  05/07/24 75.7 kg    Ideal Body  Weight:  78 kg  BMI:  Body mass index is 23.28 kg/m.  Estimated Nutritional Needs:   Kcal:  1900-2200kcal/day  Protein:  95-110g/day  Fluid:  1.9-2.2L/day  Augustin Shams MS, RD, LDN If unable to be reached, please send secure chat to RD inpatient available from 8:00a-4:00p daily

## 2024-05-11 NOTE — TOC Progression Note (Addendum)
 Transition of Care [facility]) - Progression Note    Patient Details  Name: Albert Figueroa MRN: [id number] Date of Birth: 17-Nov-1955  [facility]) CM/SW Contact  K'La JINNY Ruts, LCSW Phone Number: 05/11/2024, 3:14 PM  Clinical Narrative:    Chart reviewed. During rounds I was notified that patient family members could be potentially be stealing the patient medications and money. Per nurse, the family members are stealing the patient medications and money. It was reported to our palliative representative that the children of the patient Kaitlyn Lynn and Vladimir Lenhoff are taken the patients money and medications, and this information is coming from the sister of the patient. However, the children are saying it is the sister of the patient. I made a APS report with Ellouise Christmas at [facility].  TOC will follow the patient until discharge.      Barriers to Discharge: Continued Medical Work up               Expected Discharge Plan and Services       Living arrangements for the past 2 months: Skilled Nursing Facility, Apartment                             [facility] Agency: [employer] [facility]         Social Drivers of Health (SDOH) Interventions SDOH Screenings   Food Insecurity: Patient Unable To Answer (05/08/2024)  Housing: Unknown (05/08/2024)  Transportation Needs: Patient Unable To Answer (05/08/2024)  Utilities: Patient Unable To Answer (05/08/2024)  Financial Resource Strain: High Risk (10/06/2023)   Received from [facility]  Social Connections: Patient Unable To Answer (05/08/2024)  Tobacco Use: High Risk (05/07/2024)    Readmission Risk Interventions    05/10/2024   10:59 AM 04/28/2024   12:08 PM 12/30/2023   12:01 PM  Readmission Risk Prevention Plan  Transportation Screening Complete Complete Complete  PCP or Specialist Appt within 3-5 Days   Complete  Social Work Consult for Recovery Care Planning/Counseling   Complete  Palliative Care  Screening   Not Applicable  Medication Review Oceanographer) Complete Complete Complete  PCP or Specialist appointment within 3-5 days of discharge Complete Not Complete   PCP/Specialist Appt Not Complete comments  Pt. to be followed up by SNF   SW Recovery Care/Counseling Consult Complete    Skilled Nursing Facility  Complete

## 2024-05-11 NOTE — Progress Notes (Signed)
 NPO order has been placed.

## 2024-05-11 NOTE — Progress Notes (Signed)
 The patient is only able to have the three visitors listed under his contact per family request. RN can update family members that only know setup password.  1. Albert Figueroa 2. Albert Figueroa 3. Albert Figueroa

## 2024-05-11 NOTE — Consult Note (Addendum)
 PHARMACY CONSULT NOTE - ELECTROLYTES  Pharmacy Consult for Electrolyte Monitoring and Replacement   Recent Labs: Potassium (mmol/L)  Date Value  05/11/2024 3.4 (L)  10/06/2012 3.7   Magnesium  (mg/dL)  Date Value  91/87/7974 2.7 (H)   Calcium  (mg/dL)  Date Value  91/87/7974 8.3 (L)   Calcium , Total (mg/dL)  Date Value  98/92/7985 9.4   Albumin (g/dL)  Date Value  91/91/7974 2.5 (L)  10/06/2012 4.0   Phosphorus (mg/dL)  Date Value  91/87/7974 3.5   Sodium (mmol/L)  Date Value  05/11/2024 146 (H)  10/06/2012 137   Corrected Ca: 9.5 mg/dL  Height: 5' 11 (819.6 cm) Weight: 75.7 kg (166 lb 14.2 oz) IBW/kg (Calculated) : 75.3 Estimated Creatinine Clearance: 34.2 mL/min (A) (by C-G formula based on SCr of 2.23 mg/dL (H)).  Assessment  Albert Figueroa is a 68 y.o. male presenting with sepsis from pneumonia. PMH significant for T2DM, Lewy Body Dementia, seizures, CKD stage 4. Pharmacy has been consulted to monitor and replace electrolytes.  Diet: Nectar thick with aspiration precautions, but no longer taking PO medications due to cough MIVF: N/A Pertinent medications: insulin  aspart with meals  Goal of Therapy: Electrolytes within normal limits  Plan:  Give potassium chloride  10 mEq IV x 2  Continue to monitor electrolytes daily   Thank you for allowing pharmacy to be a part of this patient's care.  Kaegan Stigler [place], PharmD Candidate  05/11/2024 7:35 AM

## 2024-05-11 NOTE — Progress Notes (Signed)
 Palliative:  HPI: 68 y.o. 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 Reportedly found by his home health nurse altered which prompted EMS to be called. On arrival to the ED, Albert Figueroa was lethargic. Found to have sepsis and acute toxic metabolic encephalopathy secondary to significant right sided pneumonia, became hypoxic. Requiring BiPAP and vasopressor support.  Noted 6 inpatient admissions within the last 6 months. Last discharge 7/30, admission due to altered mental status and multifactorial shock, possible polypharmacy as well as adrenal insufficiency versus autonomic dysfunction. Palliative medicine was consulted for assisting with goals of care conversations.  I reviewed records and spoke with SLP and RN. I met today with Albert Figueroa. He is very sleepy but does awaken and follows some simple commands. He answers with 1-2 words verbally but oriented to person only. Very poor reserve. Resting comfortably.   I called and discussed with daughter, Albert Figueroa. I explained concern for aspiration event overnight and increased oxygen needs. I reassured her that he appears to be resting comfortable and not in any distress. However, I explained the crossroads we find ourselves. I explained progression of dementia and how this can effect swallow function. We discussed in detail feeding tube and the risks vs benefits. Albert Figueroa shares that she knows her father would not desire a feeding tube. We also discussed the risks vs benefits of comfort feeds. We discussed expectations without feeding tube and recommendation to consider comfort focused care and even hospice. Albert Figueroa verbalizes understanding. This is all very overwhelming but she agrees no feeding tubes. She wishes to hold on comfort feeds for now - willing to consider if he  is asking for food/drink. She would like to take time to consider transition to hospice but open to this option. She wishes to focus on comfort if further decline instead of escalating care. At this time we will continue current level of care with focus on comfort while family has time to process decline. We also discussed poor prognosis as likely days to weeks.   I also called and discussed the same with son, Albert Figueroa. Albert Figueroa acknowledges changes and decline with his father but is shocked that he is this ill and that is time may be limited. Albert Figueroa had no questions at this time. I encouraged him to let me know and that I have already spoken with his sister as well.   Update: I received call from Katelyn who expresses concern that [facility] sister was present at bedside. Katelyn shares concerns about her aunt and does not desire sisters other than her 2 brothers Albert Figueroa and Albert Figueroa. I shared that password is only to ensure information is not shared but not necessarily for visitation in general. I shared that this can be changed but more difficult since other family already know he is here and what room he is in. I shared that I will notify nursing staff to this concern to assist with visitation limitations. Katelyn shares appreciation and that she will be here later this evening for visit.   All questions/concerns addressed. Emotional support provided. Updated PCCM, RN, SLP, RD.   Exam: Sleepy, awakens with ease. Pleasantly confused. No distress. Breathing regular, unlabored on 5L [place]. Abd soft. Warm to touch. Generalized weakness and fatigue.   Plan: - DNR/DNI in place - Continue current care but no escalation if worsening - comfort is main  focus - Consideration of transition to comfort care and hospice  65 min  Bernarda Kitty, NP Palliative Medicine Team Pager [phone] (Please see [email or web address] for schedule) Team Phone [phone]

## 2024-05-11 NOTE — Plan of Care (Signed)
  Problem: Fluid Volume: Goal: Hemodynamic stability will improve Outcome: Progressing   Problem: Clinical Measurements: Goal: Diagnostic test results will improve Outcome: Progressing Goal: Signs and symptoms of infection will decrease Outcome: Progressing   Problem: Respiratory: Goal: Ability to maintain adequate ventilation will improve Outcome: Progressing   Problem: Education: Goal: Ability to describe self-care measures that may prevent or decrease complications (Diabetes Survival Skills Education) will improve Outcome: Progressing   Problem: Coping: Goal: Ability to adjust to condition or change in health will improve Outcome: Progressing   Problem: Fluid Volume: Goal: Ability to maintain a balanced intake and output will improve Outcome: Progressing   Problem: Health Behavior/Discharge Planning: Goal: Ability to identify and utilize available resources and services will improve Outcome: Progressing Goal: Ability to manage health-related needs will improve Outcome: Progressing   Problem: Metabolic: Goal: Ability to maintain appropriate glucose levels will improve Outcome: Progressing   Problem: Nutritional: Goal: Maintenance of adequate nutrition will improve Outcome: Progressing Goal: Progress toward achieving an optimal weight will improve Outcome: Progressing   Problem: Skin Integrity: Goal: Risk for impaired skin integrity will decrease Outcome: Progressing   Problem: Tissue Perfusion: Goal: Adequacy of tissue perfusion will improve Outcome: Progressing   Problem: Education: Goal: Knowledge of General Education information will improve Description: Including pain rating scale, medication(s)/side effects and non-pharmacologic comfort measures Outcome: Progressing   Problem: Health Behavior/Discharge Planning: Goal: Ability to manage health-related needs will improve Outcome: Progressing   Problem: Clinical Measurements: Goal: Ability to maintain  clinical measurements within normal limits will improve Outcome: Progressing Goal: Will remain free from infection Outcome: Progressing Goal: Diagnostic test results will improve Outcome: Progressing Goal: Respiratory complications will improve Outcome: Progressing Goal: Cardiovascular complication will be avoided Outcome: Progressing   Problem: Activity: Goal: Risk for activity intolerance will decrease Outcome: Progressing   Problem: Nutrition: Goal: Adequate nutrition will be maintained Outcome: Progressing   Problem: Coping: Goal: Level of anxiety will decrease Outcome: Progressing   Problem: Elimination: Goal: Will not experience complications related to bowel motility Outcome: Progressing Goal: Will not experience complications related to urinary retention Outcome: Progressing   Problem: Pain Managment: Goal: General experience of comfort will improve and/or be controlled Outcome: Progressing   Problem: Safety: Goal: Ability to remain free from injury will improve Outcome: Progressing   Problem: Skin Integrity: Goal: Risk for impaired skin integrity will decrease Outcome: Progressing

## 2024-05-11 NOTE — Plan of Care (Signed)
  Problem: Fluid Volume: Goal: Hemodynamic stability will improve Outcome: Progressing   Problem: Clinical Measurements: Goal: Signs and symptoms of infection will decrease Outcome: Progressing   Problem: Respiratory: Goal: Ability to maintain adequate ventilation will improve Outcome: Progressing   Problem: Metabolic: Goal: Ability to maintain appropriate glucose levels will improve Outcome: Progressing   Problem: Nutritional: Goal: Maintenance of adequate nutrition will improve Outcome: Not Progressing   Problem: Clinical Measurements: Goal: Respiratory complications will improve Outcome: Progressing Goal: Cardiovascular complication will be avoided Outcome: Progressing   Problem: Nutrition: Goal: Adequate nutrition will be maintained Outcome: Not Progressing   Problem: Elimination: Goal: Will not experience complications related to bowel motility Outcome: Progressing Goal: Will not experience complications related to urinary retention Outcome: Progressing   Problem: Pain Managment: Goal: General experience of comfort will improve and/or be controlled Outcome: Progressing

## 2024-05-12 DIAGNOSIS — R1310 Dysphagia, unspecified: Secondary | ICD-10-CM | POA: Diagnosis not present

## 2024-05-12 DIAGNOSIS — R627 Adult failure to thrive: Secondary | ICD-10-CM | POA: Diagnosis not present

## 2024-05-12 DIAGNOSIS — Z7189 Other specified counseling: Secondary | ICD-10-CM | POA: Diagnosis not present

## 2024-05-12 DIAGNOSIS — Z515 Encounter for palliative care: Secondary | ICD-10-CM | POA: Diagnosis not present

## 2024-05-12 LAB — BASIC METABOLIC PANEL WITH GFR
Anion gap: 10 (ref 5–15)
BUN: 84 mg/dL — ABNORMAL HIGH (ref 8–23)
CO2: 25 mmol/L (ref 22–32)
Calcium: 8.3 mg/dL — ABNORMAL LOW (ref 8.9–10.3)
Chloride: 114 mmol/L — ABNORMAL HIGH (ref 98–111)
Creatinine, Ser: 2.03 mg/dL — ABNORMAL HIGH (ref 0.61–1.24)
GFR, Estimated: 35 mL/min — ABNORMAL LOW (ref >60)
Glucose, Bld: 153 mg/dL — ABNORMAL HIGH (ref 70–99)
Potassium: 3.4 mmol/L — ABNORMAL LOW (ref 3.5–5.1)
Sodium: 149 mmol/L — ABNORMAL HIGH (ref 135–145)

## 2024-05-12 LAB — CBC
HCT: 26.3 % — ABNORMAL LOW (ref 39.0–52.0)
Hemoglobin: 8.3 g/dL — ABNORMAL LOW (ref 13.0–17.0)
MCH: 32.2 pg (ref 26.0–34.0)
MCHC: 31.6 g/dL (ref 30.0–36.0)
MCV: 101.9 fL — ABNORMAL HIGH (ref 80.0–100.0)
Platelets: 34 K/uL — ABNORMAL LOW (ref 150–400)
RBC: 2.58 MIL/uL — ABNORMAL LOW (ref 4.22–5.81)
RDW: 19.9 % — ABNORMAL HIGH (ref 11.5–15.5)
WBC: 3.3 K/uL — ABNORMAL LOW (ref 4.0–10.5)
nRBC: 0 % (ref 0.0–0.2)

## 2024-05-12 LAB — GLUCOSE, CAPILLARY
Glucose-Capillary: 128 mg/dL — ABNORMAL HIGH (ref 70–99)
Glucose-Capillary: 115 mg/dL — ABNORMAL HIGH (ref 70–99)
Glucose-Capillary: 140 mg/dL — ABNORMAL HIGH (ref 70–99)
Glucose-Capillary: 133 mg/dL — ABNORMAL HIGH (ref 70–99)
Glucose-Capillary: 130 mg/dL — ABNORMAL HIGH (ref 70–99)
Glucose-Capillary: 127 mg/dL — ABNORMAL HIGH (ref 70–99)

## 2024-05-12 LAB — CULTURE, BLOOD (ROUTINE X 2)
Culture: NO GROWTH
Culture: NO GROWTH

## 2024-05-12 LAB — PHOSPHORUS: Phosphorus: 4 mg/dL (ref 2.5–4.6)

## 2024-05-12 LAB — MAGNESIUM: Magnesium: 3 mg/dL — ABNORMAL HIGH (ref 1.7–2.4)

## 2024-05-12 MED ORDER — POTASSIUM CHLORIDE 10 MEQ/100ML IV SOLN
10.0000 meq | INTRAVENOUS | Status: AC
  Administered 2024-05-12 (×8): 10 meq via INTRAVENOUS
  Filled 2024-05-12 (×4): qty 100

## 2024-05-12 MED ORDER — DEXTROSE 5 % IV SOLN: INTRAVENOUS | Status: AC

## 2024-05-12 MED ORDER — LEVETIRACETAM (KEPPRA) 500 MG/5 ML ADULT IV PUSH
500.0000 mg | Freq: Two times a day (BID) | INTRAVENOUS | Status: DC
  Administered 2024-05-12 – 2024-05-13 (×3): 500 mg via INTRAVENOUS
  Filled 2024-05-12 (×3): qty 5

## 2024-05-12 MED ORDER — HYDROMORPHONE HCL 1 MG/ML IJ SOLN
0.5000 mg | INTRAMUSCULAR | Status: DC | PRN
  Administered 2024-05-13 – 2024-05-14 (×4): 0.5 mg via INTRAVENOUS
  Filled 2024-05-12 (×3): qty 0.5

## 2024-05-12 MED ORDER — GLYCOPYRROLATE 0.2 MG/ML IJ SOLN
0.4000 mg | INTRAMUSCULAR | Status: DC | PRN
  Administered 2024-05-15: 0.4 mg via INTRAVENOUS
  Filled 2024-05-12: qty 2

## 2024-05-12 NOTE — Plan of Care (Signed)
  Problem: Fluid Volume: Goal: Hemodynamic stability will improve Outcome: Progressing   Problem: Clinical Measurements: Goal: Diagnostic test results will improve Outcome: Progressing Goal: Signs and symptoms of infection will decrease Outcome: Progressing   Problem: Respiratory: Goal: Ability to maintain adequate ventilation will improve Outcome: Progressing   Problem: Coping: Goal: Ability to adjust to condition or change in health will improve Outcome: Progressing   Problem: Fluid Volume: Goal: Ability to maintain a balanced intake and output will improve Outcome: Progressing   Problem: Health Behavior/Discharge Planning: Goal: Ability to identify and utilize available resources and services will improve Outcome: Progressing Goal: Ability to manage health-related needs will improve Outcome: Progressing   Problem: Metabolic: Goal: Ability to maintain appropriate glucose levels will improve Outcome: Progressing   Problem: Skin Integrity: Goal: Risk for impaired skin integrity will decrease Outcome: Progressing   Problem: Tissue Perfusion: Goal: Adequacy of tissue perfusion will improve Outcome: Progressing   Problem: Health Behavior/Discharge Planning: Goal: Ability to manage health-related needs will improve Outcome: Progressing   Problem: Clinical Measurements: Goal: Respiratory complications will improve Outcome: Progressing Goal: Cardiovascular complication will be avoided Outcome: Progressing   Problem: Coping: Goal: Level of anxiety will decrease Outcome: Progressing   Problem: Elimination: Goal: Will not experience complications related to bowel motility Outcome: Progressing Goal: Will not experience complications related to urinary retention Outcome: Progressing   Problem: Pain Managment: Goal: General experience of comfort will improve and/or be controlled Outcome: Progressing   Problem: Safety: Goal: Ability to remain free from injury will  improve Outcome: Progressing   Problem: Skin Integrity: Goal: Risk for impaired skin integrity will decrease Outcome: Progressing

## 2024-05-12 NOTE — Progress Notes (Signed)
 .    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   On chart review, patient was recently discharged from the hospital on 04/28/24 following admission for AMS and multifactorial shock iso suspected polypharmacy, adrenal insufficiency vs autonomic dysfunction. Per ED reports, patient's home health nurse called EMS for altered mental status. On EMS arrival, patient was lethargic and able to answer only yes or no questions. Initial EMS BP 80/50, temp 101.5 axillary, HR 110, 96O2 on RA. 1000mg  tylenol  IV, 500NS bolus given in route.    Past Medical History  Arthritis  Type II Diabetes Mellitus  Emphysema/COPD  Epilepsy HLD HTN CKD Stage IV Lewy Body Dementia  Lumbar Stenosis  Spinal Meningitis  CSF Shunt  Seizure Tremors in both Hands  Significant Hospital Events   05/07/24:Admitted to [facility] service with sepsis and acute toxic metabolic encephalopathy 2/2 pneumonia, developed recurrent hypotension requiring levophed  gtt. PCCM consulted  8/9 remains on biPAP, severe encephalopathy FiO2 (%):  [28 %] 28 % 8/10 remains on pressors 05/10/24-  CVP 9 this am, hypotensive in shock on pressors support via central line, confusion + tried to remove foley but despite this has improved UOP, on supplemental O2 1-2L/min, had 3 bradyarrythmic episodes without symptoms, his electrolytes were repleted. Im concerned about him receiving cefepime  with encephalopathy and seizure disorder we will refine therapy today, same with atarax  TID with dementia. We will initiate PT/OT if he can tolerate it 05/11/24-  overnight worsening encephalopathy possible aspiration event currently on 5L/min.  We are removing foley today.  Palliative is meeting with family for goals of care.  05/12/24- patient remains critically ill, encephalopathic , howerver is off BIPAP and off vasopressors now. He remains with multi organ dysfunction, open skin wounds.  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 Family is working with palliative care towards possible comfort measures currently DNR  Consults:  PCCM  Procedures:  8/8: Left internal jugular cvc  Interim History / Subjective:    Remains critically ill DNR/DNI Remains on pressors CVP 5 plan for more pressors  Micro Data:  8/8: SARS-CoV-2 PCR> negative 8/8: Influenza PCR> negative 8/8: Blood culture x2> 8/8: MRSA PCR>>  8/8: Strep pneumo urinary antigen> 8/8: Legionella urinary antigen>  Antimicrobials:  Azithromycin  8/8>> Ceftriaxone  8/8>>  OBJECTIVE  Blood pressure 116/64, pulse (!) 58, temperature (!) 97.4 F (36.3 C), temperature source Axillary, resp. rate 14, height 5' 11 (1.803 m), weight 87.5 kg, SpO2 96%. CVP:  [0 mmHg-15 mmHg] 3 mmHg  FiO2 (%):  [28 %] 28 %   Intake/Output Summary (Last 24 hours) at 05/12/2024 0818 Last data filed at 05/12/2024 0530 Gross per 24 hour  Intake 529.98 ml  Output 1195 ml  Net -665.02 ml   Filed Weights   05/07/24 1430 05/12/24 0345  Weight: 75.7 kg 87.5 kg      REVIEW OF SYSTEMS More awake  PHYSICAL EXAMINATION:  GENERAL:critically ill appearing EYES: Pupils equal, round, reactive to light.  No scleral icterus.  MOUTH: Moist mucosal membrane. NECK: Supple.  PULMONARY: Lungs clear to auscultation CARDIOVASCULAR: S1 and S2.  Regular rate and rhythm GASTROINTESTINAL: Soft, nontender, -distended. Positive bowel sounds.  MUSCULOSKELETAL:  edema.  NEUROLOGIC: more awake SKIN:normal, warm to touch, Capillary refill delayed  Pulses present  bilaterally   Labs/imaging that I havepersonally reviewed  (right click and Reselect all SmartList Selections daily)    DG Chest Port 1 View Result Date: 05/11/2024 CLINICAL DATA:  Suspected pulmonary aspiration of food. EXAM: PORTABLE CHEST 1 VIEW COMPARISON:  May 09, 2024. FINDINGS: Stable cardiomediastinal silhouette. Stable right upper lobe opacity concerning for pneumonia. Left lung is clear. Hypoinflation of the lungs is noted. Left internal jugular catheter is unchanged. Bony thorax is unremarkable. IMPRESSION: Hypoinflation of the lungs. Stable right upper lobe opacity concerning for pneumonia. Followup PA and lateral chest X-ray is recommended in 3-4 weeks following trial of antibiotic therapy to ensure resolution and exclude underlying malignancy. Electronically Signed   By: Lynwood Landy Raddle M.D.   On: 05/11/2024 08:43    Labs   CBC: Recent Labs  Lab 05/07/24 1438 05/08/24 0437 05/10/24 0318 05/11/24 0515 05/12/24 0353  WBC 4.3 3.9* 5.8 4.6 3.3*  NEUTROABS 3.3  --   --   --   --   HGB 10.4* 8.6* 7.9* 7.7* 8.3*  HCT 32.9* 26.0* 24.1* 24.2* 26.3*  MCV 102.5* 100.8* 98.8 102.5* 101.9*  PLT 76* 71* 54* 41* 34*   Basic Metabolic Panel: Recent Labs  Lab 05/07/24 1430 05/07/24 1438 05/08/24 0437 05/09/24 0404 05/10/24 0707 05/11/24 0515 05/12/24 0353  NA  --    < > 142 141 142 146* 149*  K  --    < > 3.5 3.8 3.4* 3.4* 3.4*  CL  --    < > 108 105 110 116* 114*  CO2  --    < > 22 23 24 25 25   GLUCOSE  --    < > 113* 129* 140* 163* 153*  BUN  --    < > 45* 50* 65* 80* 84*  CREATININE  --    < > 2.64* 2.26* 2.18* 2.23* 2.03*  CALCIUM   --    < > 7.8* 7.8* 7.9* 8.3* 8.3*  MG 1.7  --   --  1.6* 2.4 2.7* 3.0*  PHOS  --   --   --   --   --  3.5 4.0   < > = values in this interval not displayed.   GFR: Estimated Creatinine Clearance: 37.6 mL/min (A) (by C-G formula based on SCr of 2.03 mg/dL (H)). Recent Labs  Lab 05/07/24 1438 05/07/24 1650 05/07/24 2015  05/08/24 0437 05/09/24 0407 05/10/24 0318 05/11/24 0515 05/12/24 0353  PROCALCITON  --   --  16.12 34.45 37.16  --   --   --   WBC 4.3  --   --  3.9*  --  5.8 4.6 3.3*  LATICACIDVEN 2.6* 2.2*  --   --   --   --   --   --    Liver Function Tests: Recent Labs  Lab 05/07/24 1438  AST 24  ALT 10  ALKPHOS 63  BILITOT 1.3*  PROT 7.4  ALBUMIN 2.5*   Recent Labs  Lab 05/07/24 1438  LIPASE 22   Recent Labs  Lab 05/08/24 1059  AMMONIA 31    ABG    Component Value Date/Time   PHART 7.41 05/08/2024 0802   PCO2ART 34 05/08/2024 0802   PO2ART 97 05/08/2024 0802   HCO3 21.6 05/08/2024  0802   ACIDBASEDEF 2.4 (H) 05/08/2024 0802   O2SAT 99.1 05/08/2024 0802   Coagulation Profile: Recent Labs  Lab 05/07/24 1438  INR 1.3*   Cardiac Enzymes: No results for input(s): CKTOTAL, CKMB, CKMBINDEX, TROPONINI in the last 168 hours.  HbA1C: Hgb A1c MFr Bld  Date/Time Value Ref Range Status  01/16/2024 05:28 AM 4.2 (L) 4.8 - 5.6 % Final    Comment:    (NOTE) Pre diabetes:          5.7%-6.4%  Diabetes:              >6.4%  Glycemic control for   <7.0% adults with diabetes   09/01/2022 06:41 PM 5.0 4.8 - 5.6 % Final    Comment:    (NOTE)         Prediabetes: 5.7 - 6.4         Diabetes: >6.4         Glycemic control for adults with diabetes: <7.0     CBG: Recent Labs  Lab 05/11/24 1632 05/11/24 1946 05/11/24 2301 05/12/24 0322 05/12/24 0723  GLUCAP 106* 109* 120* 128* 115*   Review of Systems:   Unable to be obtained secondary to the patient's altered mental status.   Past Medical History  He,  has a past medical history of Arthritis, Diabetes mellitus without complication (HCC), Emphysema/COPD (HCC), Epilepsy (HCC), History of brain shunt, Hyperlipidemia, Hypertension, Kidney disease, Lewy body dementia (HCC), Lumbar stenosis, Meningitis spinal, Osteoarthrosis, Seizure (HCC), Stage 4 chronic kidney disease (HCC), and Tremor of both hands.   Surgical  History    Past Surgical History:  Procedure Laterality Date   CATARACT EXTRACTION W/PHACO Right 05/14/2023   Procedure: CATARACT EXTRACTION PHACO AND INTRAOCULAR LENS PLACEMENT (IOC) RIGHT DIABETIC;  Surgeon: Mittie Gaskin, MD;  Location: [facility];  Service: Ophthalmology;  Laterality: Right;  6.70 0:39.8   CATARACT EXTRACTION W/PHACO Left 05/28/2023   Procedure: CATARACT EXTRACTION PHACO AND INTRAOCULAR LENS PLACEMENT (IOC) LEFT DIABETIC 6.34 00:37.1;  Surgeon: Mittie Gaskin, MD;  Location: [facility];  Service: Ophthalmology;  Laterality: Left;   CSF SHUNT     placed in 1980s   KNEE SURGERY     multiple surgeries on both knees     Social History   reports that he has been smoking cigarettes. He has a 96 pack-year smoking history. He has never used smokeless tobacco. He reports that he does not drink alcohol  and does not use drugs.   Family History   His family history includes CAD in his father; Cancer in his mother; Diabetes in his mother.   Allergies Allergies  Allergen Reactions   Aricept  [Donepezil ] Other (See Comments)    SEVERE BRADYCARDIA WITH HEART BLOCK DO NOT RESTART AT DISCHARGE   Gabapentin  Other (See Comments)    Lethargic, Confused   Amlodipine     Other reaction(s): Other (see comments) Caused elevated blood pressure and headaches   Ozempic (0.25 Or 0.5 Mg-Dose) [Semaglutide(0.25 Or 0.5mg -Dos)]     Lethargy.  Also dropped blood sugars too low   Home Medications  Prior to Admission medications   Medication Sig Start Date End Date Taking? Authorizing Provider  acetaminophen  (TYLENOL ) 325 MG tablet Take 2 tablets (650 mg total) by mouth every 6 (six) hours as needed for mild pain (or Fever >/= 101). 09/07/22   Krishnan, Sendil K, MD  albuterol  (VENTOLIN  HFA) [phone] Base) MCG/ACT inhaler Inhale into the lungs every 6 (six) hours as needed for wheezing or  shortness of breath.    [provider]  cyclobenzaprine  (FEXMID ) 7.5  MG tablet Take 1 tablet (7.5 mg total) by mouth 3 (three) times daily as needed for muscle spasms. Home med. 04/28/24   Awanda City, MD  dexlansoprazole (DEXILANT) 60 MG capsule Take 60 mg by mouth daily.    [provider]  divalproex  (DEPAKOTE  ER) 500 MG 24 hr tablet Take 1,000 mg by mouth in the morning and at bedtime.    [provider]  donepezil  (ARICEPT  ODT) 10 MG disintegrating tablet Take 10 mg by mouth at bedtime.    [provider]  feeding supplement (ENSURE PLUS HIGH PROTEIN) LIQD Take 237 mLs by mouth 3 (three) times daily between meals. 04/28/24   Awanda City, MD  fenofibrate  160 MG tablet Take 160 mg by mouth daily. 04/25/22   [provider]  ferrous sulfate  325 (65 FE) MG tablet Take 325 mg by mouth daily with breakfast.    [provider]  Fluticasone -Umeclidin-Vilant (TRELEGY ELLIPTA) 100-62.5-25 MCG/ACT AEPB Inhale into the lungs daily.    [provider]  hydrOXYzine  (ATARAX ) 10 MG tablet Take 10 mg by mouth 3 (three) times daily as needed.    [provider]  lidocaine  (LIDODERM ) 5 % Place 1 patch onto the skin daily. 04/05/24   [provider]  midodrine  (PROAMATINE ) 2.5 MG tablet Take 1 tablet (2.5 mg total) by mouth 3 (three) times daily with meals. 04/28/24   Awanda City, MD  Multiple Vitamin (MULTIVITAMIN WITH MINERALS) TABS tablet Take 1 tablet by mouth daily. 04/29/24   Awanda City, MD  naloxone  (NARCAN ) nasal spray 4 mg/0.1 mL Place 1 spray into the nose once as needed for up to 1 dose. 04/28/24   Awanda City, MD  Oxycodone  HCl 20 MG TABS Take 0.5 tablets (10 mg total) by mouth every 4 (four) hours as needed. Home med.  Reduced from 20 mg. 04/28/24   Awanda City, MD  polyethylene glycol (MIRALAX  / GLYCOLAX ) 17 g packet Take 17 g by mouth daily. 04/28/24   Awanda City, MD  rosuvastatin  (CRESTOR ) 20 MG tablet Take 20 mg by mouth at bedtime. 04/08/22   [provider]  Scheduled Meds:   budesonide -glycopyrrolate -formoterol   2 puff Inhalation BID   Chlorhexidine  Gluconate Cloth  6 each Topical Daily   feeding supplement (NEPRO CARB STEADY)  237 mL Oral TID BM   ferrous sulfate   325 mg Oral Q breakfast   fludrocortisone   0.05 mg Oral Daily   hydrocortisone  sod succinate (SOLU-CORTEF ) inj  50 mg Intravenous Q8H   insulin  aspart  0-9 Units Subcutaneous Q4H   ipratropium-albuterol   3 mL Nebulization BID   midodrine   5 mg Oral TID WC   mouth rinse  15 mL Mouth Rinse 4 times per day   pantoprazole   40 mg Oral Daily   rosuvastatin   20 mg Oral QHS   sodium chloride  flush  10-40 mL Intracatheter Q12H   Continuous Infusions:  cefTRIAXone  (ROCEPHIN )  IV Stopped (05/11/24 1008)   dextrose  40 mL/hr at 05/12/24 0649   potassium chloride      valproate sodium  55 mL/hr at 05/12/24 0530   PRN Meds:.acetaminophen  **OR** acetaminophen , cyclobenzaprine , nicotine , ondansetron  **OR** ondansetron  (ZOFRAN ) IV, mouth rinse, senna-docusate, sodium chloride  flush   Assessment & Plan:   68 yo white male with severe acute hypoxic resp failure due to COPD exacerbation and severe RT sided pneumonia  SEPTIC shock SOURCE-pneumonia -use vasopressors to keep MAP>65 as needed -follow ABG and LA  as needed -follow up cultures -emperic ABX -consider stress dose steroids -aggressive IV fluid Resuscitation  SEVERE COPD EXACERBATION -continue IV steroids as prescribed -continue NEB THERAPY as prescribed -wean fio2 as needed and tolerated   Severe ACUTE Hypoxic and Hypercapnic Respiratory Failure - Intermittent chest x-ray & ABG PRN - Ensure adequate pulmonary hygiene  biPAP as needed Wean fio2 as tolerated  CARDIAC -Echocardiogram 04/17/24: LVEF 60-65%, grade I DD, RV systolic function is normal, RV size normal -Vasopressors as needed to maintain MAP goal  -Continue Midodrine  10 mg TID -start Stress Dose Steroids -trend lactate -HS Troponin is normal  Acute  Metabolic Encephalopathy   Seizure disorder Chronic pain  Recurrent falls  Hx: Lewy body dementia, meningitis s/p VP shunt, and epilepsy  -Consider CT head if no improvement in mental status -seizure precautions -Continue Depakote  High risk for aspiration   ACUTE KIDNEY INJURY/Renal Failure -continue Foley Catheter-assess need -Avoid nephrotoxic agents -Follow urine output, BMP -Ensure adequate renal perfusion, optimize oxygenation -Renal dose medications   Intake/Output Summary (Last 24 hours) at 05/12/2024 0818 Last data filed at 05/12/2024 0530 Gross per 24 hour  Intake 529.98 ml  Output 1195 ml  Net -665.02 ml      Latest Ref Rng & Units 05/12/2024    3:53 AM 05/11/2024    5:15 AM 05/10/2024    7:07 AM  BMP  Glucose 70 - 99 mg/dL 846  836  859   BUN 8 - 23 mg/dL 84  80  65   Creatinine 0.61 - 1.24 mg/dL 7.96  7.76  7.81   Sodium 135 - 145 mmol/L 149  146  142   Potassium 3.5 - 5.1 mmol/L 3.4  3.4  3.4   Chloride 98 - 111 mmol/L 114  116  110   CO2 22 - 32 mmol/L 25  25  24    Calcium  8.9 - 10.3 mg/dL 8.3  8.3  7.9     Bone marrow suppression      - related to anti epileptic drugs  ENDO - ICU hypoglycemic\Hyperglycemia protocol -check FSBS per protocol   GI GI PROPHYLAXIS as indicated NUTRITIONAL STATUS DIET-->NPO, OBTAIN SPEECH THERAPY  Constipation protocol as indicated   ELECTROLYTES -follow labs as needed -replace as needed -pharmacy consultation and following  RESTRICTIVE TRANSFUSION PROTOCOL TRANSFUSION  IF HGB<7  or ACTIVE BLEEDING OR DX of ACUTE CORONARY SYNDROMES     Best practice:  Diet:  NPO Pain/Anxiety/Delirium protocol (if indicated): No VAP protocol (if indicated): Not indicated DVT prophylaxis: LMWH GI prophylaxis: PPI Glucose control:  SSI Yes Central venous access:  Yes, and it is still needed Arterial line:  N/A Foley:  Yes, and it is still needed Mobility:  bed rest  PT consulted: N/A Last date of multidisciplinary goals of care discussion  []  Code Status:  DNR Disposition: ICU   = Goals of Care = Code Status Order: DNR  Primary Emergency Contact: Lynn,[name] Patient wishes to pursue ongoing treatment, but concurred that if deteriorated to pulselessness, patient would prefer a natural death as opposed to invasive measures such as CPR and intubation.  Family should be immediately contacted in such situations.     Critical care provider statement:   Total critical care time: 33 minutes   Performed by: Parris MD   Critical care time was exclusive of separately billable procedures and treating other patients.   Critical care was necessary to treat or prevent imminent or life-threatening deterioration.   Critical care was time spent personally by  me on the following activities: development of treatment plan with patient and/or surrogate as well as nursing, discussions with consultants, evaluation of patient's response to treatment, examination of patient, obtaining history from patient or surrogate, ordering and performing treatments and interventions, ordering and review of laboratory studies, ordering and review of radiographic studies, pulse oximetry and re-evaluation of patient's condition.    Lundon Rosier, M.D.  Pulmonary & Critical Care Medicine

## 2024-05-12 NOTE — Plan of Care (Signed)
  Problem: Fluid Volume: Goal: Hemodynamic stability will improve Outcome: Progressing   Problem: Clinical Measurements: Goal: Diagnostic test results will improve Outcome: Not Progressing   Problem: Respiratory: Goal: Ability to maintain adequate ventilation will improve Outcome: Progressing   Problem: Fluid Volume: Goal: Ability to maintain a balanced intake and output will improve Outcome: Not Progressing   Problem: Metabolic: Goal: Ability to maintain appropriate glucose levels will improve Outcome: Progressing   Problem: Nutritional: Goal: Maintenance of adequate nutrition will improve Outcome: Not Progressing   Problem: Education: Goal: Knowledge of General Education information will improve Description: Including pain rating scale, medication(s)/side effects and non-pharmacologic comfort measures Outcome: Not Progressing   Problem: Clinical Measurements: Goal: Diagnostic test results will improve Outcome: Progressing Goal: Respiratory complications will improve Outcome: Progressing Goal: Cardiovascular complication will be avoided Outcome: Progressing   Problem: Nutrition: Goal: Adequate nutrition will be maintained Outcome: Not Progressing

## 2024-05-12 NOTE — Progress Notes (Signed)
 36 - Pt's daughter provided pt w/ food and drink once tray arrived. Dry cough noted by this RN. Pt's daughter stated that he ate 2 bites of mashed potatoes and then requested a drink. After she provided him w/ a small sip of powerade, pt noted to start coughing. Coughing fit lasted approximately 5 minutes. Pt's vital signs remained stable throughout episode. Pt now resting comfortably in bed.

## 2024-05-12 NOTE — Consult Note (Signed)
 PHARMACY CONSULT NOTE - ELECTROLYTES  Pharmacy Consult for Electrolyte Monitoring and Replacement   Recent Labs: Potassium (mmol/L)  Date Value  05/12/2024 3.4 (L)  10/06/2012 3.7   Magnesium  (mg/dL)  Date Value  91/86/7974 3.0 (H)   Calcium  (mg/dL)  Date Value  91/86/7974 8.3 (L)   Calcium , Total (mg/dL)  Date Value  98/92/7985 9.4   Albumin (g/dL)  Date Value  91/91/7974 2.5 (L)  10/06/2012 4.0   Phosphorus (mg/dL)  Date Value  91/86/7974 4.0   Sodium (mmol/L)  Date Value  05/12/2024 149 (H)  10/06/2012 137   Corrected Ca: 9.5 mg/dL  Height: 5' 11 (819.6 cm) Weight: 87.5 kg (192 lb 14.4 oz) IBW/kg (Calculated) : 75.3 Estimated Creatinine Clearance: 37.6 mL/min (A) (by C-G formula based on SCr of 2.03 mg/dL (H)).  Assessment  Albert Figueroa is a 68 y.o. male presenting with sepsis/altered mental status. PMH significant for T2DM, Lewy Body Dementia, seizures, and CKD stage 4. Pharmacy has been consulted to monitor and replace electrolytes.  Diet: NPO with aspiration precautions MIVF: D5W @ 40 mL/hr Pertinent medications: insulin  aspart with meals  Goal of Therapy: Electrolytes within normal limits  Plan:  Give Potassium chloride  10 mEq IV x 4  Continue to monitor electrolytes daily   Thank you for allowing pharmacy to be a part of this patient's care.  Juwon Scripter [place], PharmD Candidate  05/12/2024 7:14 AM

## 2024-05-12 NOTE — Progress Notes (Signed)
 Palliative:  HPI: 68 y.o. 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 Reportedly found by his home health nurse altered which prompted EMS to be called. On arrival to the ED, Mr. Albert Figueroa was lethargic. Found to have sepsis and acute toxic metabolic encephalopathy secondary to significant right sided pneumonia, became hypoxic. Requiring BiPAP and vasopressor support.  Noted 6 inpatient admissions within the last 6 months. Last discharge 7/30, admission due to altered mental status and multifactorial shock, possible polypharmacy as well as adrenal insufficiency versus autonomic dysfunction. Palliative medicine was consulted for assisting with goals of care conversations.   I met today again with [place]. He was sleeping peacefully but awakens to voice. He greets me. He has wet cough unable to clear even while NPO. He is asking for water. I provided him with mouth swab for moisture which he seemed to enjoy. I discussed plans with RN as well as concerns about family and visitation limitations.   I called daughter, Albert Figueroa, who reports that she is en-route to hospital soon. We agree to discuss goals further in person when she arrives.   I met with daughter Albert Figueroa. We discussed goals of care. We discussed significant risk of aspiration and this is not expected to improve. We reviewed path forward. After discussing in detail Albert Figueroa agrees that moving towards full comfort care and allowing comfort food/drink as desired is in her father's best interest. She would ideally want to take him home but she does not have the help to care for him at home. She would need more assistance than hospice would be able to provide. The timing of this is difficult as her brother Albert Figueroa fiance is being induced today for their second child.  She wishes to speak with Elspeth to allow time to visit before changing to full comfort care. She has spoken with her job and applied for FMLA to have time off work to spend time with her father.   Update: I followed up with Albert Figueroa. They are struggling with timing and decisions. We reviewed options. Ultimately we agreed to continue current care. We will allow food/drink for comfort as Albert Figueroa is asking for food/drink. We will not escalate care but will provide comfort if he declines further. I will put in place PRN medications to ensure his comfort. We will continue to follow and support and help family with path forward. We will move out of ICU to allow for improved family visitation with anticipation with transition to comfort care in the near future.   All questions/concerns addressed. Emotional support provided. Discussed with RN and PCCM.   Exam: Sleepy, awakens with ease. Pleasantly confused. Poor reserve. Asking for water. No distress. Breathing regular, unlabored on 5L . Abd soft. Warm to touch. Generalized weakness and fatigue.    Plan: - DNR/DNI in place - Continue current care but no escalation if worsening - comfort is main focus (PRN medication in place to ensure comfort if decline) - Family considering transition to comfort care/hospice if further decline (struggling with timing of transition)  50 min  Bernarda Kitty, NP Palliative Medicine Team Pager [phone] (Please see [email or web address] for schedule) Team Phone [phone]

## 2024-05-13 ENCOUNTER — Other Ambulatory Visit: Payer: Self-pay

## 2024-05-13 DIAGNOSIS — R627 Adult failure to thrive: Secondary | ICD-10-CM | POA: Diagnosis not present

## 2024-05-13 DIAGNOSIS — Z7189 Other specified counseling: Secondary | ICD-10-CM | POA: Diagnosis not present

## 2024-05-13 DIAGNOSIS — A419 Sepsis, unspecified organism: Secondary | ICD-10-CM | POA: Diagnosis not present

## 2024-05-13 DIAGNOSIS — R6520 Severe sepsis without septic shock: Secondary | ICD-10-CM | POA: Diagnosis not present

## 2024-05-13 DIAGNOSIS — Z515 Encounter for palliative care: Secondary | ICD-10-CM | POA: Diagnosis not present

## 2024-05-13 LAB — CBC
HCT: 25.6 % — ABNORMAL LOW (ref 39.0–52.0)
Hemoglobin: 8.3 g/dL — ABNORMAL LOW (ref 13.0–17.0)
MCH: 32.8 pg (ref 26.0–34.0)
MCHC: 32.4 g/dL (ref 30.0–36.0)
MCV: 101.2 fL — ABNORMAL HIGH (ref 80.0–100.0)
Platelets: 28 K/uL — CL (ref 150–400)
RBC: 2.53 MIL/uL — ABNORMAL LOW (ref 4.22–5.81)
RDW: 19.6 % — ABNORMAL HIGH (ref 11.5–15.5)
WBC: 3.4 K/uL — ABNORMAL LOW (ref 4.0–10.5)
nRBC: 0 % (ref 0.0–0.2)

## 2024-05-13 LAB — GLUCOSE, CAPILLARY
Glucose-Capillary: 123 mg/dL — ABNORMAL HIGH (ref 70–99)
Glucose-Capillary: 120 mg/dL — ABNORMAL HIGH (ref 70–99)
Glucose-Capillary: 107 mg/dL — ABNORMAL HIGH (ref 70–99)

## 2024-05-13 LAB — RENAL FUNCTION PANEL
Albumin: 1.6 g/dL — ABNORMAL LOW (ref 3.5–5.0)
Anion gap: 8 (ref 5–15)
BUN: 77 mg/dL — ABNORMAL HIGH (ref 8–23)
CO2: 25 mmol/L (ref 22–32)
Calcium: 8.1 mg/dL — ABNORMAL LOW (ref 8.9–10.3)
Chloride: 116 mmol/L — ABNORMAL HIGH (ref 98–111)
Creatinine, Ser: 1.77 mg/dL — ABNORMAL HIGH (ref 0.61–1.24)
GFR, Estimated: 42 mL/min — ABNORMAL LOW (ref >60)
Glucose, Bld: 141 mg/dL — ABNORMAL HIGH (ref 70–99)
Phosphorus: 3.7 mg/dL (ref 2.5–4.6)
Potassium: 3.5 mmol/L (ref 3.5–5.1)
Sodium: 149 mmol/L — ABNORMAL HIGH (ref 135–145)

## 2024-05-13 LAB — MAGNESIUM: Magnesium: 2.8 mg/dL — ABNORMAL HIGH (ref 1.7–2.4)

## 2024-05-13 MED ORDER — POTASSIUM CHLORIDE 10 MEQ/100ML IV SOLN
10.0000 meq | INTRAVENOUS | Status: DC
  Filled 2024-05-13 (×5): qty 100

## 2024-05-13 MED ORDER — HYDROCORTISONE SOD SUC (PF) 100 MG IJ SOLR
50.0000 mg | Freq: Two times a day (BID) | INTRAMUSCULAR | Status: DC
  Filled 2024-05-13: qty 1

## 2024-05-13 MED ORDER — HALOPERIDOL 0.5 MG PO TABS: 0.5000 mg | ORAL_TABLET | ORAL | Status: DC | PRN

## 2024-05-13 MED ORDER — HALOPERIDOL LACTATE 2 MG/ML PO CONC
0.5000 mg | ORAL | Status: DC | PRN
Start: 2024-05-13 — End: 2024-05-15

## 2024-05-13 MED ORDER — DEXTROSE 5 % IV SOLN: INTRAVENOUS | Status: DC

## 2024-05-13 MED ORDER — POLYVINYL ALCOHOL 1.4 % OP SOLN
1.0000 [drp] | Freq: Four times a day (QID) | OPHTHALMIC | Status: DC | PRN
  Administered 2024-05-14: 1 [drp] via OPHTHALMIC
  Filled 2024-05-13: qty 15

## 2024-05-13 MED ORDER — BIOTENE DRY MOUTH MT LIQD: 15.0000 mL | OROMUCOSAL | Status: DC | PRN

## 2024-05-13 MED ORDER — HALOPERIDOL LACTATE 5 MG/ML IJ SOLN
0.5000 mg | INTRAMUSCULAR | Status: DC | PRN
  Administered 2024-05-13: 0.5 mg via INTRAVENOUS
  Filled 2024-05-13: qty 1

## 2024-05-13 NOTE — Progress Notes (Signed)
 Palliative:  HPI: 68 y.o. 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 Reportedly found by his home health nurse altered which prompted EMS to be called. On arrival to the ED, Albert Figueroa was lethargic. Found to have sepsis and acute toxic metabolic encephalopathy secondary to significant right sided pneumonia, became hypoxic. Requiring BiPAP and vasopressor support. Noted 6 inpatient admissions within the last 6 months. Last discharge 7/30, admission due to altered mental status and multifactorial shock, possible polypharmacy as well as adrenal insufficiency versus autonomic dysfunction. Palliative medicine was consulted for assisting with goals of care conversations.    I met today at [facility] bedside along with daughter, [name], and her cousin. Albert Figueroa is resting comfortably although he complains of hip pain. He is able to have moments of appropriate and meaningful interaction with his family. He is sleeping most of the time. [name] shares that he will take some sips and will take ~3 bites of food and then is done. She does report an episode of swallowing when he struggled and coughed so [name] gave him a break but did report this frightened her. I explained that she did the right thing by taking a break and waiting for him to recover. I explained that if he continued to be distressed we have medication available to provide him relief - I explained that we use opioids for this purpose.   [name] reports desire to d/c CVC. We discussed use of CVC and if we wish to continue IVF, IV medications, and labs we should maintain CVC. After discussion about the risks vs benefits acknowledging these interventions are not going to alter the outcome, [name] would like to proceed with focusing on comfort care and treating  symptoms but not numbers (vital signs and labs). [name] shares that her brother's baby just arrived. She is hoping now that the baby is here safely that he will have a chance to visit with their father.   All questions/concerns addressed. Emotional support provided. Updated RN and Dr. Jens.   Exam: Sleepy, awakens with ease. Pleasantly confused. Poor reserve. No distress. Breathing regular, unlabored. Abd soft. Warm to touch. Generalized weakness and fatigue.     Plan: - DNR - Full comfort care - Prognosis poor  50 min  Bernarda Kitty, NP Palliative Medicine Team Pager [phone] (Please see [email or web address] for schedule) Team Phone [phone]

## 2024-05-13 NOTE — Progress Notes (Signed)
 Nutrition Follow Up Note   DOCUMENTATION CODES:   Non-severe (moderate) malnutrition in context of chronic illness  INTERVENTION:   Ensure Plus High Protein po TID if full scope of care is decided, each supplement provides 350 kcal and 20 grams of protein  Magic cup TID with meals, each supplement provides 290 kcal and 9 grams of protein  MVI po daily if full scope of care is decided   Vitamin C 500mg  po BID if full scope of care decided   Pt remains at high refeed risk; recommend monitor potassium, magnesium  and phosphorus labs daily until stable  Daily weights   NUTRITION DIAGNOSIS:   Moderate Malnutrition related to chronic illness as evidenced by moderate fat depletion, moderate muscle depletion. 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  Pt continues to be lethargic. SLP recommended NPO r/t high aspiration risk. Family has decided against any feeding tubes. Pt initiated on a pureed diet for comfort; per RN report, pt struggling to take bites and sips. RD will continue Magic Cups on meal trays. Will add vitamins and Ensure if family decides on full scope of care. Palliative care following for GOC. Family is leaning towards comfort care but continuing current care at this time. Per chart, pt appears to be at his UBW currently.    Medications reviewed and include: solu-cortef , insulin , ceftriaxone   Labs reviewed: Na 149(H), K 3.5 wnl, BUN 77(H), creat 1.77(H). P 3.7 wnl, Mg 2.8(H) Vitamin D - 24.30(L)- 4/2 Wbc 3.4(L), Hgb 8.3(L), Hct 25.6(L) Cbgs- 120, 123 x 24 hrs    UOP-   Diet Order:    Diet Order             DIET - DYS 1 Room service appropriate? Yes; Fluid consistency: Thin  Diet effective now                  EDUCATION NEEDS:   No education needs have been identified at this time  Skin:  Skin Assessment: Reviewed RN Assessment (Stage III elbow)  Last BM:  8/14- type 6  Height:   Ht Readings from Last 1 Encounters:  05/07/24 5' 11 (1.803 m)    Weight:   Wt Readings from Last 1 Encounters:  05/13/24 89.2 kg    Ideal Body Weight:  78 kg  BMI:  Body mass index is 27.43 kg/m.  Estimated Nutritional Needs:   Kcal:  1900-2200kcal/day  Protein:  95-110g/day  Fluid:  1.9-2.2L/day  Augustin Shams MS, RD, LDN If unable to be reached, please send secure chat to RD inpatient available from 8:00a-4:00p daily

## 2024-05-13 NOTE — Progress Notes (Addendum)
 Progress Note    Albert Figueroa  FMW:[id number] DOB: 01/22/1956  DOA: 05/07/2024 PCP: Alla Amis, MD      Brief Narrative:    Medical records reviewed and are as summarized below:  Albert Figueroa is a 68 y.o. 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    On chart review, patient was recently discharged from the hospital on 04/28/24 following admission for AMS and multifactorial shock iso suspected polypharmacy, adrenal insufficiency vs autonomic dysfunction. Per ED reports, patient's home health nurse called EMS for altered mental status. On EMS arrival, patient was lethargic and able to answer only yes or no questions. Initial EMS BP 80/50, temp 101.5 axillary, HR 110, 96O2 on RA. 1000mg  tylenol  IV, 500NS bolus given in route.     Significant Hospital Events   05/07/24:Admitted to [facility] service with sepsis and acute toxic metabolic encephalopathy 2/2 pneumonia, developed recurrent hypotension requiring levophed  gtt. PCCM consulted  8/9 remains on biPAP, severe encephalopathy  >  FiO2 (%):  [28 %] 28 %   8/10 remains on pressors 05/10/24-  CVP 9 this am, hypotensive in shock on pressors support via central line, confusion + tried to remove foley but despite this has improved UOP, on supplemental O2 1-2L/min, had 3 bradyarrythmic episodes without symptoms, his electrolytes were repleted. Im concerned about him receiving cefepime  with encephalopathy and seizure disorder we will refine therapy today, same with atarax  TID with dementia. We will initiate PT/OT if he can tolerate it 05/11/24- overnight worsening encephalopathy possible aspiration event currently on 5L/min.  We are removing foley today.  Palliative is meeting with family for goals of care.  05/12/24- patient remains  critically ill, encephalopathic , howerver is off BIPAP and off vasopressors now. He remains with multi organ dysfunction, open skin wounds.  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 Family is working with palliative care towards possible comfort measures currently DNR     Care transferred to [facility] team on 05/13/2024    Assessment/Plan:   Principal Problem:   Severe sepsis with acute organ dysfunction [facility]) Active Problems:   Tobacco use   Type 2 diabetes mellitus with renal complication (HCC)   Hyperlipidemia, unspecified   Type 2 diabetes mellitus with chronic kidney disease, without long-term current use of insulin  (HCC)   Seizure disorder (HCC)   Malnutrition of moderate degree   Nutrition Problem: Moderate Malnutrition Etiology: chronic illness  Signs/Symptoms: moderate fat depletion, moderate muscle depletion   Body mass index is 27.43 kg/m.    Septic shock secondary to pneumonia: Continue IV ceftriaxone .  Decrease IV hydrocortisone  from 50 mg every 8 to 50 every 12 hours. No growth on blood cultures. S/p treatment with IV vasopressors.   Severe COPD exacerbation: Continue IV hydrocortisone .  Continue bronchodilators.    Acute hypoxic and hypercapnic respiratory failure: He is on 2 L/min oxygen via [place]. Initially required treatment with BiPAP.   Acute metabolic encephalopathy: Mental status is better but he is not back to baseline.    AKI on CKD stage IIIb: Improving Worsening hypernatremia: Improving.  Continue IV fluids.  Increased rate from 40 mL/h to 75 mL/h.   Hypokalemia: Replete potassium and monitor levels   Seizure disorder plan: Continue IV  Keppra     Acute on chronic thrombocytopenia: Worsening thrombocytopenia.  Platelet down to 28,000.  No evidence of bleeding at this time.  Continue to monitor.    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  Family to continue discussions with palliative care team.   Diet Order             DIET - DYS 1 Room service appropriate? Yes; Fluid consistency: Thin  Diet effective now                                  Consultants: Intensivist Palliative care  Procedures: Left IJ central line    Medications:    budesonide -glycopyrrolate -formoterol   2 puff Inhalation BID   Chlorhexidine  Gluconate Cloth  6 each Topical Daily   hydrocortisone  sod succinate (SOLU-CORTEF ) inj  50 mg Intravenous Q12H   insulin  aspart  0-9 Units Subcutaneous Q4H   levETIRAcetam   500 mg Intravenous Q12H   mouth rinse  15 mL Mouth Rinse 4 times per day   sodium chloride  flush  10-40 mL Intracatheter Q12H   Continuous Infusions:  cefTRIAXone  (ROCEPHIN )  IV 2 g (05/13/24 0956)   dextrose      potassium chloride        Anti-infectives (From admission, onward)    Start     Dose/Rate Route Frequency Ordered Stop   05/11/24 1000  cefTRIAXone  (ROCEPHIN ) 2 g in sodium chloride  0.9 % 100 mL IVPB        2 g 200 mL/hr over 30 Minutes Intravenous Every 24 hours 05/11/24 0748 05/15/24 0959   05/10/24 1700  cefUROXime  (CEFTIN ) tablet 500 mg  Status:  Discontinued        500 mg Oral 2 times daily with meals 05/10/24 1135 05/11/24 0748   05/10/24 1300  azithromycin  (ZITHROMAX ) tablet 500 mg        500 mg Oral Daily 05/10/24 1136 05/10/24 1332   05/09/24 1000  ceFEPIme  (MAXIPIME ) 2 g in sodium chloride  0.9 % 100 mL IVPB  Status:  Discontinued        2 g 200 mL/hr over 30 Minutes Intravenous Every 12 hours 05/09/24 0702 05/10/24 1013   05/08/24 1000  ceFEPIme  (MAXIPIME ) 2 g in sodium chloride  0.9 % 100 mL IVPB  Status:  Discontinued        2 g 200 mL/hr over 30 Minutes Intravenous Every 24 hours 05/08/24 0823 05/09/24 0702   05/07/24 1615  cefTRIAXone  (ROCEPHIN ) 2 g in sodium chloride  0.9 % 100 mL  IVPB  Status:  Discontinued        2 g 200 mL/hr over 30 Minutes Intravenous Every 24 hours 05/07/24 1612 05/08/24 0812   05/07/24 1615  azithromycin  (ZITHROMAX ) 500 mg in sodium chloride  0.9 % 250 mL IVPB  Status:  Discontinued        500 mg 250 mL/hr over 60 Minutes Intravenous Every 24 hours 05/07/24 1612 05/10/24 1136   05/07/24 1613  vancomycin  variable dose per unstable renal function (pharmacist dosing)  Status:  Discontinued         Does not apply See admin instructions 05/07/24 1613 05/09/24 0859   05/07/24 1600  vancomycin  (VANCOREADY) IVPB 1750 mg/350 mL        1,750 mg 175 mL/hr over 120 Minutes Intravenous  Once 05/07/24 1531 05/08/24  9146   05/07/24 1530  piperacillin -tazobactam (ZOSYN ) IVPB 3.375 g  Status:  Discontinued        3.375 g 12.5 mL/hr over 240 Minutes Intravenous  Once 05/07/24 1523 05/08/24 0855              Family Communication/Anticipated D/C date and plan/Code Status   DVT prophylaxis: Place TED hose Start: 05/07/24 1612     Code Status: Do not attempt resuscitation (DNR) - Comfort care  Family Communication: None Disposition Plan: To be determined   Status is: Inpatient Remains inpatient appropriate because: Sepsis from pneumonia, respiratory failure       Subjective:   Interval events noted.  He has no complaints.  He said he feels a little better.  Objective:    Vitals:   05/13/24 0800 05/13/24 0900 05/13/24 1000 05/13/24 1100  BP: (!) 146/73 (!) 112/96 127/74 108/62  Pulse: (!) 58 62 (!) 54 (!) 54  Resp: 13 18 15 15   Temp:      TempSrc:      SpO2: 96% 97% 99% 99%  Weight:      Height:       No data found.   Intake/Output Summary (Last 24 hours) at 05/13/2024 1229 Last data filed at 05/13/2024 0956 Gross per 24 hour  Intake 1200.1 ml  Output 1050 ml  Net 150.1 ml   Filed Weights   05/07/24 1430 05/12/24 0345 05/13/24 0420  Weight: 75.7 kg 87.5 kg 89.2 kg    Exam:  GEN: NAD SKIN: Warm and dry EYES: No  pallor or icterus ENT: MMM CV: RRR PULM: CTA B ABD: soft, ND, NT, +BS CNS: AAO x 1, non focal EXT: Bilateral leg edema.  No tenderness.        Data Reviewed:   I have personally reviewed following labs and imaging studies:  Labs: Labs show the following:   Basic Metabolic Panel: Recent Labs  Lab 05/09/24 0404 05/10/24 0707 05/11/24 0515 05/12/24 0353 05/13/24 0422  NA 141 142 146* 149* 149*  K 3.8 3.4* 3.4* 3.4* 3.5  CL 105 110 116* 114* 116*  CO2 23 24 25 25 25   GLUCOSE 129* 140* 163* 153* 141*  BUN 50* 65* 80* 84* 77*  CREATININE 2.26* 2.18* 2.23* 2.03* 1.77*  CALCIUM  7.8* 7.9* 8.3* 8.3* 8.1*  MG 1.6* 2.4 2.7* 3.0* 2.8*  PHOS  --   --  3.5 4.0 3.7   GFR Estimated Creatinine Clearance: 43.1 mL/min (A) (by C-G formula based on SCr of 1.77 mg/dL (H)). Liver Function Tests: Recent Labs  Lab 05/07/24 1438 05/13/24 0422  AST 24  --   ALT 10  --   ALKPHOS 63  --   BILITOT 1.3*  --   PROT 7.4  --   ALBUMIN 2.5* 1.6*   Recent Labs  Lab 05/07/24 1438  LIPASE 22   Recent Labs  Lab 05/08/24 1059  AMMONIA 31   Coagulation profile Recent Labs  Lab 05/07/24 1438  INR 1.3*    CBC: Recent Labs  Lab 05/07/24 1438 05/08/24 0437 05/10/24 0318 05/11/24 0515 05/12/24 0353 05/13/24 0422  WBC 4.3 3.9* 5.8 4.6 3.3* 3.4*  NEUTROABS 3.3  --   --   --   --   --   HGB 10.4* 8.6* 7.9* 7.7* 8.3* 8.3*  HCT 32.9* 26.0* 24.1* 24.2* 26.3* 25.6*  MCV 102.5* 100.8* 98.8 102.5* 101.9* 101.2*  PLT 76* 71* 54* 41* 34* 28*   Cardiac Enzymes: No results for input(s):  CKTOTAL, CKMB, CKMBINDEX, TROPONINI in the last 168 hours. BNP (last 3 results) No results for input(s): PROBNP in the last 8760 hours. CBG: Recent Labs  Lab 05/12/24 1928 05/12/24 2304 05/13/24 0258 05/13/24 0750 05/13/24 1153  GLUCAP 130* 127* 123* 120* 107*   D-Dimer: No results for input(s): DDIMER in the last 72 hours. Hgb A1c: No results for input(s): HGBA1C in the last  72 hours. Lipid Profile: No results for input(s): CHOL, HDL, LDLCALC, TRIG, CHOLHDL, LDLDIRECT in the last 72 hours. Thyroid  function studies: No results for input(s): TSH, T4TOTAL, T3FREE, THYROIDAB in the last 72 hours.  Invalid input(s): FREET3 Anemia work up: No results for input(s): VITAMINB12, FOLATE, FERRITIN, TIBC, IRON, RETICCTPCT in the last 72 hours. Sepsis Labs: Recent Labs  Lab 05/07/24 1438 05/07/24 1650 05/07/24 2015 05/08/24 0437 05/09/24 0407 05/10/24 0318 05/11/24 0515 05/12/24 0353 05/13/24 0422  PROCALCITON  --   --  16.12 34.45 37.16  --   --   --   --   WBC 4.3  --   --  3.9*  --  5.8 4.6 3.3* 3.4*  LATICACIDVEN 2.6* 2.2*  --   --   --   --   --   --   --     Microbiology Recent Results (from the past 240 hours)  Blood Culture (routine x 2)     Status: None   Collection Time: 05/07/24  2:38 PM   Specimen: BLOOD  Result Value Ref Range Status   Specimen Description BLOOD BLOOD RIGHT FOREARM  Final   Special Requests   Final    BOTTLES DRAWN AEROBIC AND ANAEROBIC Blood Culture results may not be optimal due to an inadequate volume of blood received in culture bottles   Culture   Final    NO GROWTH 5 DAYS Performed at [facility], [address] [place], [place] [address]    Report Status 05/12/2024 FINAL  Final  Blood Culture (routine x 2)     Status: None   Collection Time: 05/07/24  3:02 PM   Specimen: BLOOD LEFT ARM  Result Value Ref Range Status   Specimen Description BLOOD LEFT ARM  Final   Special Requests   Final    BOTTLES DRAWN AEROBIC AND ANAEROBIC Blood Culture results may not be optimal due to an inadequate volume of blood received in culture bottles   Culture   Final    NO GROWTH 5 DAYS Performed at [facility], [address], [place], [place] [address]    Report Status 05/12/2024 FINAL  Final  MRSA Next Gen by PCR, Nasal     Status: None   Collection Time: 05/07/24  4:17 PM    Specimen: Nasal Mucosa; Nasal Swab  Result Value Ref Range Status   MRSA by PCR Next Gen NOT DETECTED NOT DETECTED Final    Comment: (NOTE) The GeneXpert MRSA Assay (FDA approved for NASAL specimens only), is one component of a comprehensive MRSA colonization surveillance program. It is not intended to diagnose MRSA infection nor to guide or monitor treatment for MRSA infections. Test performance is not FDA approved in patients less than 11 years old. Performed at [facility], [address] [place], [place] [address]   Resp panel by RT-PCR (RSV, Flu A&B, Covid) Anterior Nasal Swab     Status: None   Collection Time: 05/07/24  8:40 PM   Specimen: Anterior Nasal Swab  Result Value Ref Range Status   SARS Coronavirus 2 by  RT PCR NEGATIVE NEGATIVE Final    Comment: (NOTE) SARS-CoV-2 target nucleic acids are NOT DETECTED.  The SARS-CoV-2 RNA is generally detectable in upper respiratory specimens during the acute phase of infection. The lowest concentration of SARS-CoV-2 viral copies this assay can detect is 138 copies/mL. A negative result does not preclude SARS-Cov-2 infection and should not be used as the sole basis for treatment or other patient management decisions. A negative result may occur with  improper specimen collection/handling, submission of specimen other than nasopharyngeal swab, presence of viral mutation(s) within the areas targeted by this assay, and inadequate number of viral copies(<138 copies/mL). A negative result must be combined with clinical observations, patient history, and epidemiological information. The expected result is Negative.  Fact Sheet for Patients:  BloggerCourse.com  Fact Sheet for Healthcare Providers:  SeriousBroker.it  This test is no t yet approved or cleared by the [place]  FDA and  has been authorized for detection and/or diagnosis of SARS-CoV-2 by FDA under an  Emergency Use Authorization (EUA). This EUA will remain  in effect (meaning this test can be used) for the duration of the COVID-19 declaration under Section 564(b)(1) of the Act, 21 U.S.C.section 360bbb-3(b)(1), unless the authorization is terminated  or revoked sooner.       Influenza A by PCR NEGATIVE NEGATIVE Final   Influenza B by PCR NEGATIVE NEGATIVE Final    Comment: (NOTE) The Xpert Xpress SARS-CoV-2/FLU/RSV plus assay is intended as an aid in the diagnosis of influenza from Nasopharyngeal swab specimens and should not be used as a sole basis for treatment. Nasal washings and aspirates are unacceptable for Xpert Xpress SARS-CoV-2/FLU/RSV testing.  Fact Sheet for Patients: BloggerCourse.com  Fact Sheet for Healthcare Providers: SeriousBroker.it  This test is not yet approved or cleared by the [place]  FDA and has been authorized for detection and/or diagnosis of SARS-CoV-2 by FDA under an Emergency Use Authorization (EUA). This EUA will remain in effect (meaning this test can be used) for the duration of the COVID-19 declaration under Section 564(b)(1) of the Act, 21 U.S.C. section 360bbb-3(b)(1), unless the authorization is terminated or revoked.     Resp Syncytial Virus by PCR NEGATIVE NEGATIVE Final    Comment: (NOTE) Fact Sheet for Patients: BloggerCourse.com  Fact Sheet for Healthcare Providers: SeriousBroker.it  This test is not yet approved or cleared by the [place]  FDA and has been authorized for detection and/or diagnosis of SARS-CoV-2 by FDA under an Emergency Use Authorization (EUA). This EUA will remain in effect (meaning this test can be used) for the duration of the COVID-19 declaration under Section 564(b)(1) of the Act, 21 U.S.C. section 360bbb-3(b)(1), unless the authorization is terminated or revoked.  Performed at [facility], [address] [place], [place] [address]   Respiratory (~20 pathogens) panel by PCR     Status: None   Collection Time: 05/07/24  8:40 PM   Specimen: Nasopharyngeal Swab; Respiratory  Result Value Ref Range Status   Adenovirus NOT DETECTED NOT DETECTED Final   Coronavirus 229E NOT DETECTED NOT DETECTED Final    Comment: (NOTE) The Coronavirus on the Respiratory Panel, DOES NOT test for the novel  Coronavirus (2019 nCoV)    Coronavirus HKU1 NOT DETECTED NOT DETECTED Final   Coronavirus NL63 NOT DETECTED NOT DETECTED Final   Coronavirus OC43 NOT DETECTED NOT DETECTED Final   Metapneumovirus NOT DETECTED NOT DETECTED Final   Rhinovirus / Enterovirus NOT DETECTED NOT DETECTED Final   Influenza A NOT DETECTED NOT  DETECTED Final   Influenza B NOT DETECTED NOT DETECTED Final   Parainfluenza Virus 1 NOT DETECTED NOT DETECTED Final   Parainfluenza Virus 2 NOT DETECTED NOT DETECTED Final   Parainfluenza Virus 3 NOT DETECTED NOT DETECTED Final   Parainfluenza Virus 4 NOT DETECTED NOT DETECTED Final   Respiratory Syncytial Virus NOT DETECTED NOT DETECTED Final   Bordetella pertussis NOT DETECTED NOT DETECTED Final   Bordetella Parapertussis NOT DETECTED NOT DETECTED Final   Chlamydophila pneumoniae NOT DETECTED NOT DETECTED Final   Mycoplasma pneumoniae NOT DETECTED NOT DETECTED Final    Comment: Performed at [facility], [address] [place], [place] [address]    Procedures and diagnostic studies:  No results found.             LOS: 6 days   Ardelle Haliburton  [facility]   Pager on [email or web address]. If 7PM-7AM, please contact night-coverage at [email or web address]     05/13/2024, 12:29 PM

## 2024-05-13 NOTE — Plan of Care (Signed)
  Problem: Pain Managment: Goal: General experience of comfort will improve and/or be controlled Outcome: Progressing  Pt on Comfort Measures Problem: Safety: Goal: Ability to remain free from injury will improve Outcome: Progressing

## 2024-05-14 DIAGNOSIS — F02B0 Dementia in other diseases classified elsewhere, moderate, without behavioral disturbance, psychotic disturbance, mood disturbance, and anxiety: Secondary | ICD-10-CM

## 2024-05-14 DIAGNOSIS — A419 Sepsis, unspecified organism: Secondary | ICD-10-CM | POA: Diagnosis not present

## 2024-05-14 DIAGNOSIS — E440 Moderate protein-calorie malnutrition: Secondary | ICD-10-CM

## 2024-05-14 DIAGNOSIS — Z515 Encounter for palliative care: Secondary | ICD-10-CM | POA: Diagnosis not present

## 2024-05-14 DIAGNOSIS — G40909 Epilepsy, unspecified, not intractable, without status epilepticus: Secondary | ICD-10-CM

## 2024-05-14 DIAGNOSIS — N179 Acute kidney failure, unspecified: Secondary | ICD-10-CM | POA: Diagnosis not present

## 2024-05-14 DIAGNOSIS — R6520 Severe sepsis without septic shock: Secondary | ICD-10-CM | POA: Diagnosis not present

## 2024-05-14 MED ORDER — HYDROMORPHONE HCL-NACL 50-0.9 MG/50ML-% IV SOLN
0.2500 mg/h | INTRAVENOUS | Status: DC
  Administered 2024-05-14: 0.5 mg/h via INTRAVENOUS
  Filled 2024-05-14: qty 50

## 2024-05-14 MED ORDER — HYDROMORPHONE BOLUS VIA INFUSION: 0.5000 mg | INTRAVENOUS | Status: DC | PRN

## 2024-05-14 MED ORDER — OXYCODONE HCL 20 MG/ML PO CONC
5.0000 mg | Freq: Four times a day (QID) | ORAL | Status: DC | PRN
  Administered 2024-05-15: 5 mg via ORAL
  Filled 2024-05-14: qty 0.5

## 2024-05-14 MED ORDER — OXYCODONE HCL 20 MG/ML PO CONC
5.0000 mg | Freq: Four times a day (QID) | ORAL | Status: DC
  Administered 2024-05-14: 5 mg via ORAL
  Filled 2024-05-14: qty 0.5

## 2024-05-14 MED ORDER — LEVETIRACETAM (KEPPRA) 500 MG/5 ML ADULT IV PUSH
500.0000 mg | Freq: Two times a day (BID) | INTRAVENOUS | Status: DC
  Administered 2024-05-14 – 2024-05-15 (×3): 500 mg via INTRAVENOUS
  Filled 2024-05-14 (×4): qty 5

## 2024-05-14 MED ORDER — HYDROMORPHONE HCL 1 MG/ML IJ SOLN: 0.5000 mg | INTRAMUSCULAR | Status: DC | PRN

## 2024-05-14 NOTE — Progress Notes (Signed)
 Comfort care rounds completed on patient. Dilaudid  gtt being initiated by bedside nurse. Bolus from bag given for discomfort and labored breathing per MD directive, at bedside. Orders to be changed to bolus from bag.  Family member at bedside. Denies any unmet needs. Grateful for care.

## 2024-05-14 NOTE — Progress Notes (Addendum)
 Daily Progress Note   Patient Name: Albert Figueroa      Date: 05/14/2024 DOB: 1955/12/10  Age: 68 y.o. MRN#: [id number] Attending Physician: Jens Durand, MD Primary Care Physician: Alla Amis, MD Admit Date: 05/07/2024  Reason for Consultation/Follow-up: Establishing goals of care  HPI/Brief Hospital Review: 68 y.o. 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  Reportedly found by his home health nurse altered which prompted EMS to be called.  On arrival to the ED, Albert Figueroa was lethargic.  Found to have sepsis and acute toxic metabolic encephalopathy secondary to significant right sided pneumonia, became hypoxic.  Requiring BiPAP and vasopressor support.   Noted 6 inpatient admissions within the last 6 months   Last discharge 7/30, admission due to altered mental status and multifactorial shock, possible polypharmacy as well as adrenal insufficiency versus autonomic dysfunction  Possible aspiration event 8/12 with worsening encephalopathy and increased oxygen needs  Transitioned to [facility] 8/14   Palliative medicine was consulted for assisting with goals of care conversations.  Subjective: Extensive chart review has been completed prior to meeting patient including labs, vital signs, imaging, progress notes, orders, and available advanced directive documents from current and previous encounters.    Visited with Albert Figueroa at his bedside, he is awake, alert, able to engage in conversation but remains confused. Vascular access at bedside to remove CVCC. Sister and her son at bedside during  time of visit.  Family shares he had a great evening and night, able to eat several pieces of pizza and shares a few laughs with his daughter. He has been awake and talkative this AM. Family at bedside shares [name] is hoping to have a conversation with PMT today and will be at bedside later this afternoon.  Met with [name] and Albert Figueroa's sister at bedside. Katie expressed her concerns to feeling as though she made the wrong decision regarding transition to comfort care. She shared Albert Figueroa was alert and talkative overnight, able to eat a few bites of pizza. Katie did share overnight Albert Figueroa became confused and agitated-attempting to get out of bed. We discussed chronic disease trajectory and EOL expectations. We discussed  Albert Figueroa's recurrent hospitalizations, overall functional and cognitive decline and poor PO intake over the last several months. Discussed Albert Figueroa's overall poor prognosis. Katie requested we review PTA medications, at this time she requests Keppra  to be restarted for seizure prevention--agree with restarting based on goal of comfort. [name] also shares her frustration with Albert Figueroa pureed diet. Albert Figueroa refuses to eat pureed meal trays and has requested she go out and order him food. He has been able to tolerate a few bites of the food she has brought in. We discussed offering a regular diet with the acknowledgment and understanding of Albert Figueroa's HIGH risk for aspiration and regular diet would be for comfort feeding only--accepting his high risk of aspiration leading to a terminal decompensation-Katie verbalized acknowledgement and understanding.  Since returning to bedside, Albert Figueroa required a single dose of hydromorphone , was then transitioned to continuous hydromorphone  infusion. Albert Figueroa sleeping in bed, does not acknowledge my presence in room, respirations even and unlabored. Discussed with [name], possible need to lower dose of continuous infusion or consideration of increasing dose  of IV push.  We discussed the overall philosophy of hospice, services offered by hospice and the difference between hospice at home, LTC and IPU. [name] shares home with hospice is not an option, she would be interested in [facility] with hospice following.  Answered and addressed all questions and concerns. PMT to continue to follow for ongoing needs and support.  Addendum: Due to concern for possible oversedation, continuous hydromorphone  discontinued. Orders placed for scheduled oral Roxicodone -continued IV hydromorphone  for breakthrough pain.  Addendum: Returned to bedside, order changed for PRN Roxicodone  as Albert Figueroa remains heavily sedated. Review of MAR, received dose of Roxicodone  1744. Case discussed and collaborated with Wellsite geologist.   Palliative Care Assessment & Plan   Assessment/Recommendation/Plan  Comfort measures remain Restart Depakot for seizure prevention Regular diet for comfort-family acknowledges HIGH aspiration risk  Care plan was discussed with primary team, nursing staff and TOC.  Thank you for allowing the Palliative Medicine Team to assist in the care of this patient.  Total time:  65 minutes  Time spent includes: Detailed review of medical records (labs, imaging, vital signs), medically appropriate exam (mental status, respiratory, cardiac, skin), discussed with treatment team, counseling and educating patient, family and staff, documenting clinical information, medication management and coordination of care.  Albert Lesches, DNP, AGNP-C Palliative Medicine   Please contact Palliative Medicine Team phone at [phone] for questions and concerns.

## 2024-05-14 NOTE — Progress Notes (Signed)
 Nutrition Brief Note  Chart reviewed. Pt now transitioning to comfort care.  No further nutrition interventions planned at this time.  Please re-consult as needed.   Margery ORN, RD, LDN, CDCES Registered Dietitian III Certified Diabetes Care and Education Specialist If unable to reach this RD, please use RD Inpatient group chat on secure chat between hours of 8am-4 pm daily

## 2024-05-14 NOTE — Plan of Care (Signed)
 Pt agitated, haldol  x1 per daughter; pt been sleeping. Problem: Clinical Measurements: Goal: Diagnostic test results will improve Outcome: Progressing   Problem: Respiratory: Goal: Ability to maintain adequate ventilation will improve Outcome: Progressing   Problem: Skin Integrity: Goal: Risk for impaired skin integrity will decrease Outcome: Progressing   Problem: Elimination: Goal: Will not experience complications related to bowel motility Outcome: Progressing   Problem: Pain Management: Goal: Satisfaction with pain management regimen will improve Outcome: Progressing

## 2024-05-14 NOTE — TOC Progression Note (Signed)
 Transition of Care [facility]) - Progression Note    Patient Details  Name: Albert Figueroa MRN: [id number] Date of Birth: Feb 27, 1956  Transition of Care [facility]) CM/SW Contact  Dalia GORMAN Fuse, RN Phone Number: 05/14/2024, 10:03 AM  Clinical Narrative:     [facility] and is now full comfort care. TOC will continue to follow for dc planning.     Barriers to Discharge: Continued Medical Work up               Expected Discharge Plan and Services       Living arrangements for the past 2 months: Skilled Nursing Facility, Apartment                             [facility] Agency: [employer] [facility]         Social Drivers of Health (SDOH) Interventions SDOH Screenings   Food Insecurity: Patient Unable To Answer (05/08/2024)  Housing: Unknown (05/08/2024)  Transportation Needs: Patient Unable To Answer (05/08/2024)  Utilities: Patient Unable To Answer (05/08/2024)  Financial Resource Strain: High Risk (10/06/2023)   Received from [facility]  Social Connections: Patient Unable To Answer (05/08/2024)  Tobacco Use: High Risk (05/07/2024)    Readmission Risk Interventions    05/10/2024   10:59 AM 04/28/2024   12:08 PM 12/30/2023   12:01 PM  Readmission Risk Prevention Plan  Transportation Screening Complete Complete Complete  PCP or Specialist Appt within 3-5 Days   Complete  Social Work Consult for Recovery Care Planning/Counseling   Complete  Palliative Care Screening   Not Applicable  Medication Review [employer]) Complete Complete Complete  PCP or Specialist appointment within 3-5 days of discharge Complete Not Complete   PCP/Specialist Appt Not Complete comments  Pt. to be followed up by SNF   SW Recovery Care/Counseling Consult Complete    Skilled Nursing Facility  Complete

## 2024-05-14 NOTE — Progress Notes (Signed)
 Pt sleeping on arrival, normal breath sound, vital stable, spo2 98%; pt family complaining on dilaudid  IV given on day shift after pt c/o hip pain, stated someone didn't read instructions right on Oxy 5mg  solution given po after d/c of drip. Pt daughter concern of pt state drowsy and eyes slightly shuts; RN educated family on med given can make pt sleepy, drowsy and lethargic; recommended eye drops to make it moist; charge RN Arland in room talking with family of possible reason on meds given during day. Pt daughter upset/ crying of changes in health decline, charge RN educated on comfort care. Will continue to monitor.

## 2024-05-14 NOTE — Progress Notes (Signed)
 Progress Note    Albert Figueroa  FMW:[id number] DOB: May 24, 1956  DOA: 05/07/2024 PCP: Alla Amis, MD      Brief Narrative:    Medical records reviewed and are as summarized below:  Albert Figueroa is a 68 y.o. 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    On chart review, patient was recently discharged from the hospital on 04/28/24 following admission for AMS and multifactorial shock iso suspected polypharmacy, adrenal insufficiency vs autonomic dysfunction. Per ED reports, patient's home health nurse called EMS for altered mental status. On EMS arrival, patient was lethargic and able to answer only yes or no questions. Initial EMS BP 80/50, temp 101.5 axillary, HR 110, 96O2 on RA. 1000mg  tylenol  IV, 500NS bolus given in route.     Significant Hospital Events   05/07/24:Admitted to TRH service with sepsis and acute toxic metabolic encephalopathy 2/2 pneumonia, developed recurrent hypotension requiring levophed  gtt. PCCM consulted  8/9 remains on biPAP, severe encephalopathy  >  FiO2 (%):  [28 %] 28 %   8/10 remains on pressors 05/10/24-  CVP 9 this am, hypotensive in shock on pressors support via central line, confusion + tried to remove foley but despite this has improved UOP, on supplemental O2 1-2L/min, had 3 bradyarrythmic episodes without symptoms, his electrolytes were repleted. Im concerned about him receiving cefepime  with encephalopathy and seizure disorder we will refine therapy today, same with atarax  TID with dementia. We will initiate PT/OT if he can tolerate it 05/11/24- overnight worsening encephalopathy possible aspiration event currently on 5L/min.  We are removing foley today.  Palliative is meeting with family for goals of care.  05/12/24- patient remains  critically ill, encephalopathic , howerver is off BIPAP and off vasopressors now. He remains with multi organ dysfunction, open skin wounds.  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 Family is working with palliative care towards possible comfort measures currently DNR     Care transferred to [facility] on 05/13/2024    Assessment/Plan:   Principal Problem:   Severe sepsis with acute organ dysfunction [facility]) Active Problems:   Tobacco use   Type 2 diabetes mellitus with renal complication (HCC)   Hyperlipidemia, unspecified   Type 2 diabetes mellitus with chronic kidney disease, without long-term current use of insulin  (HCC)   Seizure disorder (HCC)   Malnutrition of moderate degree   Nutrition Problem: Moderate Malnutrition Etiology: chronic illness  Signs/Symptoms: moderate fat depletion, moderate muscle depletion   Body mass index is 27.43 kg/m.    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  She said she felt she was second-guessing herself and wondered if she had made the right decision.  She question why all other treatments had been discontinued. I went over her diagnosis, prognosis and goals of care with her. I made it clear that she can rescind comfort care at any time if she was not  comfortable with her decision. After our discussion, she said she felt better and wanted to continue with comfort care. Continue comfort measures. Change IV Dilaudid  to boluses only to IV Dilaudid  boluses with Dilaudid   infusion. Plan of care was also discussed with PT, sister, at the bedside.  She is agreeable with comfort care.     Diet Order             DIET - DYS 1 Room service appropriate? Yes; Fluid consistency: Thin  Diet effective now                                  Consultants: Intensivist Palliative care  Procedures: Left IJ central line    Medications:    budesonide -glycopyrrolate -formoterol   2 puff Inhalation BID   Continuous Infusions:  HYDROmorphone  0.5 mg/hr (05/14/24 1020)     Anti-infectives (From admission, onward)    Start     Dose/Rate Route Frequency Ordered Stop   05/11/24 1000  cefTRIAXone  (ROCEPHIN ) 2 g in sodium chloride  0.9 % 100 mL IVPB  Status:  Discontinued        2 g 200 mL/hr over 30 Minutes Intravenous Every 24 hours 05/11/24 0748 05/13/24 1341   05/10/24 1700  cefUROXime  (CEFTIN ) tablet 500 mg  Status:  Discontinued        500 mg Oral 2 times daily with meals 05/10/24 1135 05/11/24 0748   05/10/24 1300  azithromycin  (ZITHROMAX ) tablet 500 mg        500 mg Oral Daily 05/10/24 1136 05/10/24 1332   05/09/24 1000  ceFEPIme  (MAXIPIME ) 2 g in sodium chloride  0.9 % 100 mL IVPB  Status:  Discontinued        2 g 200 mL/hr over 30 Minutes Intravenous Every 12 hours 05/09/24 0702 05/10/24 1013   05/08/24 1000  ceFEPIme  (MAXIPIME ) 2 g in sodium chloride  0.9 % 100 mL IVPB  Status:  Discontinued        2 g 200 mL/hr over 30 Minutes Intravenous Every 24 hours 05/08/24 0823 05/09/24 0702   05/07/24 1615  cefTRIAXone  (ROCEPHIN ) 2 g in sodium chloride  0.9 % 100 mL IVPB  Status:  Discontinued        2 g 200 mL/hr over 30 Minutes Intravenous Every 24 hours 05/07/24 1612 05/08/24 0812   05/07/24 1615  azithromycin  (ZITHROMAX ) 500 mg in sodium chloride  0.9 % 250 mL IVPB  Status:  Discontinued        500 mg 250 mL/hr over 60 Minutes Intravenous Every 24 hours 05/07/24 1612 05/10/24 1136   05/07/24 1613  vancomycin  variable dose per unstable renal  function (pharmacist dosing)  Status:  Discontinued         Does not apply See admin instructions 05/07/24 1613 05/09/24 0859   05/07/24 1600  vancomycin  (VANCOREADY) IVPB 1750 mg/350 mL        1,750 mg 175 mL/hr over 120 Minutes Intravenous  Once 05/07/24 1531 05/08/24 0853   05/07/24 1530  piperacillin -tazobactam (ZOSYN ) IVPB 3.375 g  Status:  Discontinued        3.375 g 12.5 mL/hr over 240 Minutes Intravenous  Once 05/07/24 1523 05/08/24 0855              Family Communication/Anticipated D/C date and plan/Code Status   DVT prophylaxis:      Code Status: Do not attempt resuscitation (DNR) - Comfort care  Family Communication: None Disposition Plan: Possible discharge to  hospice   Status is: Inpatient Remains inpatient appropriate because: Sepsis from pneumonia, respiratory failure       Subjective:   Interval events noted.  Patient had complained of back pain earlier.  She was given IV Dilaudid  bolus but that was inadequate.  Dilaudid  infusion was ordered for adequate pain control.  When I walked into the room, he was lethargic because he had just received Dilaudid .  Orie, sister, was at the bedside.  Marcey, LPN, was at the bedside.  Tiffany, RN, was at the bedside.  Reportedly, he was communicative before receiving  Dilaudid .  Objective:    Vitals:   05/13/24 1100 05/13/24 1246 05/13/24 1703 05/14/24 0902  BP: 108/62 (!) 145/83 131/64 127/78  Pulse: (!) 54 61 61 69  Resp: 15 18 16 17   Temp:  (!) 97.1 F (36.2 C) (!) 97.5 F (36.4 C) 97.6 F (36.4 C)  TempSrc:      SpO2: 99% 95% 96% 99%  Weight:      Height:       No data found.   Intake/Output Summary (Last 24 hours) at 05/14/2024 1049 Last data filed at 05/14/2024 0912 Gross per 24 hour  Intake 360 ml  Output --  Net 360 ml   Filed Weights   05/07/24 1430 05/12/24 0345 05/13/24 0420  Weight: 75.7 kg 87.5 kg 89.2 kg    Exam:   GEN: NAD SKIN: Warm and dry EYES: No pallor or icterus ENT:  MMM CV: RRR PULM: Bilateral rhonchi ABD: soft, ND, NT, +BS CNS: Lethargic EXT: Bilateral leg edema       Data Reviewed:   I have personally reviewed following labs and imaging studies:  Labs: Labs show the following:   Basic Metabolic Panel: Recent Labs  Lab 05/09/24 0404 05/10/24 0707 05/11/24 0515 05/12/24 0353 05/13/24 0422  NA 141 142 146* 149* 149*  K 3.8 3.4* 3.4* 3.4* 3.5  CL 105 110 116* 114* 116*  CO2 23 24 25 25 25   GLUCOSE 129* 140* 163* 153* 141*  BUN 50* 65* 80* 84* 77*  CREATININE 2.26* 2.18* 2.23* 2.03* 1.77*  CALCIUM  7.8* 7.9* 8.3* 8.3* 8.1*  MG 1.6* 2.4 2.7* 3.0* 2.8*  PHOS  --   --  3.5 4.0 3.7   GFR Estimated Creatinine Clearance: 43.1 mL/min (A) (by C-G formula based on SCr of 1.77 mg/dL (H)). Liver Function Tests: Recent Labs  Lab 05/07/24 1438 05/13/24 0422  AST 24  --   ALT 10  --   ALKPHOS 63  --   BILITOT 1.3*  --   PROT 7.4  --   ALBUMIN 2.5* 1.6*   Recent Labs  Lab 05/07/24 1438  LIPASE 22   Recent Labs  Lab 05/08/24 1059  AMMONIA 31   Coagulation profile Recent Labs  Lab 05/07/24 1438  INR 1.3*    CBC: Recent Labs  Lab 05/07/24 1438 05/08/24 0437 05/10/24 0318 05/11/24 0515 05/12/24 0353 05/13/24 0422  WBC 4.3 3.9* 5.8 4.6 3.3* 3.4*  NEUTROABS 3.3  --   --   --   --   --   HGB 10.4* 8.6* 7.9* 7.7* 8.3* 8.3*  HCT 32.9* 26.0* 24.1* 24.2* 26.3* 25.6*  MCV 102.5* 100.8* 98.8 102.5* 101.9* 101.2*  PLT 76* 71* 54* 41* 34* 28*   Cardiac Enzymes: No results for input(s): CKTOTAL, CKMB, CKMBINDEX, TROPONINI in the last 168 hours. BNP (last 3 results) No results for input(s): PROBNP in the last 8760 hours. CBG: Recent Labs  Lab 05/12/24 1928 05/12/24 2304 05/13/24 0258 05/13/24 0750 05/13/24 1153  GLUCAP 130* 127* 123* 120* 107*   D-Dimer: No results for input(s): DDIMER in the last 72 hours. Hgb A1c: No results for input(s): HGBA1C in the last 72 hours. Lipid Profile: No results for  input(s): CHOL, HDL, LDLCALC, TRIG, CHOLHDL, LDLDIRECT in the last 72 hours. Thyroid  function studies: No results for input(s): TSH, T4TOTAL, T3FREE, THYROIDAB in the last 72 hours.  Invalid input(s): FREET3 Anemia work up: No results for input(s): VITAMINB12, FOLATE, FERRITIN, TIBC, IRON, RETICCTPCT in the last 72 hours. Sepsis Labs: Recent Labs  Lab 05/07/24 1438 05/07/24 1650 05/07/24 2015 05/08/24 0437 05/09/24 0407 05/10/24 0318 05/11/24 0515 05/12/24 0353 05/13/24 0422  PROCALCITON  --   --  16.12 34.45 37.16  --   --   --   --   WBC 4.3  --   --  3.9*  --  5.8 4.6 3.3* 3.4*  LATICACIDVEN 2.6* 2.2*  --   --   --   --   --   --   --     Microbiology Recent Results (from the past 240 hours)  Blood Culture (routine x 2)     Status: None   Collection Time: 05/07/24  2:38 PM   Specimen: BLOOD  Result Value Ref Range Status   Specimen Description BLOOD BLOOD RIGHT FOREARM  Final   Special Requests   Final    BOTTLES DRAWN AEROBIC AND ANAEROBIC Blood Culture results may not be optimal due to an inadequate volume of blood received in culture bottles   Culture   Final    NO GROWTH 5 DAYS Performed at [facility], [address] [place], [place] [address]    Report Status 05/12/2024 FINAL  Final  Blood Culture (routine x 2)     Status: None   Collection Time: 05/07/24  3:02 PM   Specimen: BLOOD LEFT ARM  Result Value Ref Range Status   Specimen Description BLOOD LEFT ARM  Final   Special Requests   Final    BOTTLES DRAWN AEROBIC AND ANAEROBIC Blood Culture results may not be optimal due to an inadequate volume of blood received in culture bottles   Culture   Final    NO GROWTH 5 DAYS Performed at [facility], [address], [place], [place] [address]    Report Status 05/12/2024 FINAL  Final  MRSA Next Gen by PCR, Nasal     Status: None   Collection Time: 05/07/24  4:17 PM   Specimen: Nasal Mucosa; Nasal Swab   Result Value Ref Range Status   MRSA by PCR Next Gen NOT DETECTED NOT DETECTED Final    Comment: (NOTE) The GeneXpert MRSA Assay (FDA approved for NASAL specimens only), is one component of a comprehensive MRSA colonization surveillance program. It is not intended to diagnose MRSA infection nor to guide or monitor treatment for MRSA infections. Test performance is not FDA approved in patients less than 11 years old. Performed at [facility], [address] [place], [place] [address]   Resp panel by RT-PCR (RSV, Flu A&B, Covid) Anterior Nasal Swab     Status: None   Collection Time: 05/07/24  8:40 PM   Specimen: Anterior Nasal Swab  Result Value Ref Range Status   SARS Coronavirus 2 by RT PCR NEGATIVE NEGATIVE Final    Comment: (NOTE) SARS-CoV-2 target nucleic acids are NOT DETECTED.  The SARS-CoV-2 RNA is generally detectable in upper respiratory  specimens during the acute phase of infection. The lowest concentration of SARS-CoV-2 viral copies this assay can detect is 138 copies/mL. A negative result does not preclude SARS-Cov-2 infection and should not be used as the sole basis for treatment or other patient management decisions. A negative result may occur with  improper specimen collection/handling, submission of specimen other than nasopharyngeal swab, presence of viral mutation(s) within the areas targeted by this assay, and inadequate number of viral copies(<138 copies/mL). A negative result must be combined with clinical observations, patient history, and epidemiological information. The expected result is Negative.  Fact Sheet for Patients:  BloggerCourse.com  Fact Sheet for Healthcare Providers:  SeriousBroker.it  This test is no t yet approved or cleared by the [place]  FDA and  has been authorized for detection and/or diagnosis of SARS-CoV-2 by FDA under an Emergency Use Authorization (EUA). This  EUA will remain  in effect (meaning this test can be used) for the duration of the COVID-19 declaration under Section 564(b)(1) of the Act, 21 U.S.C.section 360bbb-3(b)(1), unless the authorization is terminated  or revoked sooner.       Influenza A by PCR NEGATIVE NEGATIVE Final   Influenza B by PCR NEGATIVE NEGATIVE Final    Comment: (NOTE) The Xpert Xpress SARS-CoV-2/FLU/RSV plus assay is intended as an aid in the diagnosis of influenza from Nasopharyngeal swab specimens and should not be used as a sole basis for treatment. Nasal washings and aspirates are unacceptable for Xpert Xpress SARS-CoV-2/FLU/RSV testing.  Fact Sheet for Patients: BloggerCourse.com  Fact Sheet for Healthcare Providers: SeriousBroker.it  This test is not yet approved or cleared by the [place]  FDA and has been authorized for detection and/or diagnosis of SARS-CoV-2 by FDA under an Emergency Use Authorization (EUA). This EUA will remain in effect (meaning this test can be used) for the duration of the COVID-19 declaration under Section 564(b)(1) of the Act, 21 U.S.C. section 360bbb-3(b)(1), unless the authorization is terminated or revoked.     Resp Syncytial Virus by PCR NEGATIVE NEGATIVE Final    Comment: (NOTE) Fact Sheet for Patients: BloggerCourse.com  Fact Sheet for Healthcare Providers: SeriousBroker.it  This test is not yet approved or cleared by the [place]  FDA and has been authorized for detection and/or diagnosis of SARS-CoV-2 by FDA under an Emergency Use Authorization (EUA). This EUA will remain in effect (meaning this test can be used) for the duration of the COVID-19 declaration under Section 564(b)(1) of the Act, 21 U.S.C. section 360bbb-3(b)(1), unless the authorization is terminated or revoked.  Performed at [facility], [address] [place], [place]  [address]   Respiratory (~20 pathogens) panel by PCR     Status: None   Collection Time: 05/07/24  8:40 PM   Specimen: Nasopharyngeal Swab; Respiratory  Result Value Ref Range Status   Adenovirus NOT DETECTED NOT DETECTED Final   Coronavirus 229E NOT DETECTED NOT DETECTED Final    Comment: (NOTE) The Coronavirus on the Respiratory Panel, DOES NOT test for the novel  Coronavirus (2019 nCoV)    Coronavirus HKU1 NOT DETECTED NOT DETECTED Final   Coronavirus NL63 NOT DETECTED NOT DETECTED Final   Coronavirus OC43 NOT DETECTED NOT DETECTED Final   Metapneumovirus NOT DETECTED NOT DETECTED Final   Rhinovirus / Enterovirus NOT DETECTED NOT DETECTED Final   Influenza A NOT DETECTED NOT DETECTED Final   Influenza B NOT DETECTED NOT DETECTED Final   Parainfluenza Virus 1 NOT DETECTED NOT DETECTED Final   Parainfluenza Virus 2 NOT  DETECTED NOT DETECTED Final   Parainfluenza Virus 3 NOT DETECTED NOT DETECTED Final   Parainfluenza Virus 4 NOT DETECTED NOT DETECTED Final   Respiratory Syncytial Virus NOT DETECTED NOT DETECTED Final   Bordetella pertussis NOT DETECTED NOT DETECTED Final   Bordetella Parapertussis NOT DETECTED NOT DETECTED Final   Chlamydophila pneumoniae NOT DETECTED NOT DETECTED Final   Mycoplasma pneumoniae NOT DETECTED NOT DETECTED Final    Comment: Performed at [facility], [address], [place], [place] [address]    Procedures and diagnostic studies:  No results found.             LOS: 7 days   Herschel Fleagle  [facility]   Pager on [email or web address]. If 7PM-7AM, please contact night-coverage at [email or web address]     05/14/2024, 10:49 AM

## 2024-05-15 DIAGNOSIS — Z515 Encounter for palliative care: Secondary | ICD-10-CM | POA: Diagnosis not present

## 2024-05-15 DIAGNOSIS — A419 Sepsis, unspecified organism: Secondary | ICD-10-CM | POA: Diagnosis not present

## 2024-05-15 DIAGNOSIS — J189 Pneumonia, unspecified organism: Secondary | ICD-10-CM | POA: Diagnosis not present

## 2024-05-15 DIAGNOSIS — N179 Acute kidney failure, unspecified: Secondary | ICD-10-CM | POA: Diagnosis not present

## 2024-05-15 DIAGNOSIS — R6520 Severe sepsis without septic shock: Secondary | ICD-10-CM | POA: Diagnosis not present

## 2024-05-15 NOTE — Discharge Summary (Signed)
 Physician Discharge Summary   Patient: Albert Figueroa MRN: [id number] DOB: 1956/04/10  Admit date:     05/07/2024  Discharge date: 05/15/24  Discharge Physician: AIDA CHO   PCP: Alla Amis, MD   Recommendations at discharge:   Follow-up with the hospice team at the hospice house  Discharge Diagnoses: Principal Problem:   Severe sepsis with acute organ dysfunction [facility]) Active Problems:   Tobacco use   Type 2 diabetes mellitus with renal complication (HCC)   Hyperlipidemia, unspecified   Type 2 diabetes mellitus with chronic kidney disease, without long-term current use of insulin  (HCC)   Seizure disorder (HCC)   Malnutrition of moderate degree  Resolved Problems:   * No resolved hospital problems. *  Hospital Course:  Albert Figueroa is a 68 y.o. 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    On chart review, patient was recently discharged from the hospital on 04/28/24 following admission for AMS and multifactorial shock iso suspected polypharmacy, adrenal insufficiency vs autonomic dysfunction. Per ED reports, patient's home health nurse called EMS for altered mental status. On EMS arrival, patient was lethargic and able to answer only yes or no questions. Initial EMS BP 80/50, temp 101.5 axillary, HR 110, 96O2 on RA. 1000mg  tylenol  IV, 500NS bolus given in route.         Significant Hospital Events   05/07/24:Admitted to [facility] service with sepsis and acute toxic metabolic encephalopathy 2/2 pneumonia, developed recurrent hypotension requiring levophed  gtt. PCCM consulted  8/9 remains on biPAP, severe encephalopathy  >  FiO2 (%):  [28 %] 28 %    8/10 remains on pressors 05/10/24-  CVP 9 this am, hypotensive in shock on pressors support via central line, confusion +  tried to remove foley but despite this has improved UOP, on supplemental O2 1-2L/min, had 3 bradyarrythmic episodes without symptoms, his electrolytes were repleted. Im concerned about him receiving cefepime  with encephalopathy and seizure disorder we will refine therapy today, same with atarax  TID with dementia. We will initiate PT/OT if he can tolerate it 05/11/24- overnight worsening encephalopathy possible aspiration event currently on 5L/min.  We are removing foley today.  Palliative is meeting with family for goals of care.  05/12/24- patient remains critically ill, encephalopathic , howerver is off BIPAP and off vasopressors now. He remains with multi organ dysfunction, open skin wounds.  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  He also required BiPAP for acute respiratory failure.  After goals of care discussion, his family requested that patient be transition to comfort care.  Comfort measures were initiated in the hospital.  Hospice team was consulted to assist with disposition.  Patient deemed eligible  for comfort care at the hospice house.  He will be discharged to hospice house today.  Family is in agreement.        Consultants: Intensivist, palliative care, hospice Procedures performed: Left IJ central  line   Disposition: Hospice house Diet recommendation:  Discharge Diet Orders (From admission, onward)     Start     Ordered   05/15/24 0000  Diet general        05/15/24 1600           Regular diet DISCHARGE MEDICATION: Allergies as of 05/15/2024       Reactions   Aricept  [donepezil ] Other (See Comments)   SEVERE BRADYCARDIA WITH HEART BLOCK DO NOT RESTART AT DISCHARGE   Gabapentin  Other (See Comments)   Lethargic, Confused   Amlodipine    Other reaction(s): Other (see comments) Caused elevated blood pressure and headaches   Ozempic (0.25 Or 0.5 Mg-dose) [semaglutide(0.25 Or 0.5mg -dos)]    Lethargy.  Also dropped blood sugars too low        Medication List     STOP taking these medications    acetaminophen  325 MG tablet Commonly known as: TYLENOL    albuterol  108 (90 Base) MCG/ACT inhaler Commonly known as: VENTOLIN  HFA   cyclobenzaprine  7.5 MG tablet Commonly known as: FEXMID    Dexilant 60 MG capsule Generic drug: dexlansoprazole   divalproex  500 MG 24 hr tablet Commonly known as: DEPAKOTE  ER   donepezil  10 MG disintegrating tablet Commonly known as: ARICEPT  ODT   feeding supplement Liqd   fenofibrate  160 MG tablet   ferrous sulfate  325 (65 FE) MG tablet   hydrOXYzine  10 MG tablet Commonly known as: ATARAX    lidocaine  5 % Commonly known as: LIDODERM    midodrine  2.5 MG tablet Commonly known as: PROAMATINE    multivitamin with minerals Tabs tablet   naloxone  4 MG/0.1ML Liqd nasal spray kit Commonly known as: NARCAN    Oxycodone  HCl 20 MG Tabs   polyethylene glycol 17 g packet Commonly known as: MIRALAX  / GLYCOLAX    rosuvastatin  20 MG tablet Commonly known as: CRESTOR    tiZANidine  4 MG tablet Commonly known as: ZANAFLEX    Trelegy Ellipta 100-62.5-25 MCG/ACT Aepb Generic drug: Fluticasone -Umeclidin-Vilant        Discharge Exam: Filed Weights   05/07/24 1430 05/12/24 0345 05/13/24 0420  Weight: 75.7 kg 87.5 kg 89.2 kg   GEN:  NAD SKIN: Warm and dry EYES: No pallor or icterus ENT: MMM CV: RRR PULM: CTA B ABD: soft, ND, NT, +BS CNS: AAO x  1 (person), confused EXT: Edema of bilateral upper and lower extremities   Condition at discharge: stable  The results of significant diagnostics from this hospitalization (including imaging, microbiology, ancillary and laboratory) are listed below for reference.   Imaging Studies: DG Chest Port 1 View Result Date: 05/11/2024 CLINICAL DATA:  Suspected pulmonary aspiration of food. EXAM: PORTABLE CHEST 1 VIEW COMPARISON:  May 09, 2024. FINDINGS: Stable cardiomediastinal silhouette. Stable right upper lobe opacity concerning for pneumonia. Left lung is clear. Hypoinflation of the lungs is noted. Left internal jugular catheter is unchanged. Bony thorax is unremarkable. IMPRESSION: Hypoinflation of the lungs. Stable right upper lobe opacity concerning for pneumonia. Followup PA and lateral chest X-ray is recommended in 3-4 weeks following trial of antibiotic therapy to ensure resolution and exclude underlying malignancy. Electronically Signed   By: Lynwood Landy Raddle M.D.   On: 05/11/2024 08:43   DG Chest Port 1 View Result Date: 05/09/2024 CLINICAL DATA:  Pneumonia. EXAM: PORTABLE CHEST  1 VIEW COMPARISON:  05/07/2024 FINDINGS: Low volume film. The cardio pericardial silhouette is enlarged. Interval decrease in right upper lung airspace disease with probable layering left effusion. Left IJ central line tip overlies the proximal SVC level. Telemetry leads overlie the chest. IMPRESSION: Interval decrease in right upper lung airspace disease with probable layering left effusion. Electronically Signed   By: Camellia Candle M.D.   On: 05/09/2024 08:15   DG Chest 1 View Result Date: 05/07/2024 CLINICAL DATA:  Central venous catheter placement EXAM: CHEST  1 VIEW COMPARISON:  05/07/2024 FINDINGS: Left internal jugular central venous catheter tip overlies the expected superior vena cava.  Progressive consolidation within the right upper lobe. Lung volumes are small, but stable since prior examination. No pneumothorax or pleural effusion. Cardiac size within normal limits. Right vascularity is normal. Ventriculoperitoneal shunt catheter tubing overlies the right hemithorax. No acute bone abnormality. IMPRESSION: 1. Left internal jugular central venous catheter tip within the superior vena cava. No pneumothorax. 2. Progressive right upper lobe consolidation. Follow-up chest radiograph is recommended in 3-4 weeks, following conservative therapy, to document resolution. If persistent at that time, dedicated contrast enhanced CT imaging of the chest is recommended for further evaluation. Electronically Signed   By: Dorethia Molt M.D.   On: 05/07/2024 23:36   DG Chest Port 1 View Result Date: 05/07/2024 CLINICAL DATA:  Shortness of breath EXAM: PORTABLE CHEST 1 VIEW COMPARISON:  Chest x-ray 05/07/2024 FINDINGS: Dense focal airspace opacity in the right upper lobe has increased in size and density. Left lung is clear. Costophrenic angles are clear. No pneumothorax. Cardiomediastinal silhouette is within normal limits. Osseous structures are stable. Catheter overlies the right chest, unchanged. IMPRESSION: Increasing focal airspace opacity in the right upper lobe. Findings are concerning for pneumonia. Follow-up imaging recommended to confirm complete resolution. Electronically Signed   By: Greig Pique M.D.   On: 05/07/2024 19:19   DG Chest Port 1 View Result Date: 05/07/2024 CLINICAL DATA:  Questionable sepsis.  Altered mental status. EXAM: PORTABLE CHEST 1 VIEW COMPARISON:  04/17/2024 FINDINGS: Catheter over the right neck, chest and abdomen likely ventriculostomy catheter unchanged. Lungs are adequately inflated with slight elevation of the left hemidiaphragm. Mild hazy opacification over the right mid to upper lung which may be due to atelectasis or infection. No effusion. Cardiomediastinal  silhouette and remainder of the exam is unchanged. IMPRESSION: Mild hazy opacification over the right mid to upper lung which may be due to atelectasis or infection. Electronically Signed   By: Toribio Agreste M.D.   On: 05/07/2024 15:04   DG Shoulder Left Port Result Date: 04/19/2024 CLINICAL DATA:  [id number] Acromial process of scapula fracture [id number] EXAM: LEFT SHOULDER COMPARISON:  X-ray left shoulder 03/06/2024 FINDINGS: Subacute left acromial fracture with associated periosteal reaction. There is no evidence of acute displaced fracture or dislocation. There is no evidence of arthropathy or other focal bone abnormality. Soft tissues are unremarkable. IMPRESSION: 1. Subacute left acromial fracture with associated periosteal reaction. 2.  No acute displaced fracture or dislocation. Electronically Signed   By: Morgane  Naveau M.D.   On: 04/19/2024 20:19   ECHOCARDIOGRAM COMPLETE Result Date: 04/18/2024    ECHOCARDIOGRAM REPORT   Patient Name:   PRAJWAL FELLNER Dacus Date of Exam: 04/17/2024 Medical Rec #:  [id number]     Height:       71.0 in Accession #:    [id number]    Weight:       125.0 lb Date of Birth:  12/24/1955  BSA:          1.727 m Patient Age:    68 years      BP:           95/53 mmHg Patient Gender: M             HR:           75 bpm. Exam Location:  [facility] Procedure: 2D Echo, Cardiac Doppler and Color Doppler (Both Spectral and Color            Flow Doppler were utilized during procedure). Indications:     Abnormal ECG R94.31  History:         Patient has no prior history of Echocardiogram examinations.                  Risk Factors:Hypertension.  Sonographer:     Bari Roar Referring Phys:  [id number] LONELL KANDICE MOOSE Diagnosing Phys: Cara JONETTA Lovelace MD IMPRESSIONS  1. TDS.  2. Left ventricular ejection fraction, by estimation, is 60 to 65%. The left ventricle has normal function. The left ventricle has no regional wall motion abnormalities. Left ventricular diastolic parameters are consistent with Grade I  diastolic dysfunction (impaired relaxation).  3. Right ventricular systolic function is normal. The right ventricular size is normal.  4. The mitral valve is normal in structure. Trivial mitral valve regurgitation.  5. The aortic valve is normal in structure. Aortic valve regurgitation is not visualized. Conclusion(s)/Recommendation(s): Poor windows for evaluation of left ventricular function by transthoracic echocardiography. Would recommend an alternative means of evaluation. FINDINGS  Left Ventricle: Left ventricular ejection fraction, by estimation, is 60 to 65%. The left ventricle has normal function. The left ventricle has no regional wall motion abnormalities. Strain was performed and the global longitudinal strain is indeterminate. The left ventricular internal cavity size was normal in size. There is no left ventricular hypertrophy. Left ventricular diastolic parameters are consistent with Grade I diastolic dysfunction (impaired relaxation). Right Ventricle: The right ventricular size is normal. No increase in right ventricular wall thickness. Right ventricular systolic function is normal. Left Atrium: Left atrial size was normal in size. Right Atrium: Right atrial size was normal in size. Pericardium: There is no evidence of pericardial effusion. Mitral Valve: The mitral valve is normal in structure. Trivial mitral valve regurgitation. MV peak gradient, 10.6 mmHg. The mean mitral valve gradient is 3.0 mmHg. Tricuspid Valve: The tricuspid valve is normal in structure. Tricuspid valve regurgitation is trivial. Aortic Valve: The aortic valve is normal in structure. Aortic valve regurgitation is not visualized. Aortic valve mean gradient measures 5.0 mmHg. Aortic valve peak gradient measures 9.7 mmHg. Aortic valve area, by VTI measures 2.34 cm. Pulmonic Valve: The pulmonic valve was normal in structure. Pulmonic valve regurgitation is not visualized. Aorta: The ascending aorta was not well visualized.  IAS/Shunts: No atrial level shunt detected by color flow Doppler. Additional Comments: TDS. 3D was performed not requiring image post processing on an independent workstation and was indeterminate.  LEFT VENTRICLE PLAX 2D LVIDd:         3.90 cm   Diastology LVIDs:         2.60 cm   LV e' medial:    9.36 cm/s LV PW:         0.80 cm   LV E/e' medial:  9.9 LV IVS:        1.10 cm   LV e' lateral:   14.00 cm/s LVOT diam:     1.70  cm   LV E/e' lateral: 6.6 LV SV:         61 LV SV Index:   35 LVOT Area:     2.27 cm  RIGHT VENTRICLE RV Basal diam:  2.20 cm RV Mid diam:    2.45 cm RV S prime:     18.50 cm/s TAPSE (M-mode): 1.8 cm LEFT ATRIUM           Index        RIGHT ATRIUM          Index LA diam:      3.40 cm 1.97 cm/m   RA Area:     8.05 cm LA Vol (A4C): 39.2 ml 22.70 ml/m  RA Volume:   11.60 ml 6.72 ml/m  AORTIC VALVE                     PULMONIC VALVE AV Area (Vmax):    2.17 cm      PV Vmax:        1.14 m/s AV Area (Vmean):   1.87 cm      PV Peak grad:   5.2 mmHg AV Area (VTI):     2.34 cm      RVOT Peak grad: 4 mmHg AV Vmax:           156.00 cm/s AV Vmean:          108.000 cm/s AV VTI:            0.260 m AV Peak Grad:      9.7 mmHg AV Mean Grad:      5.0 mmHg LVOT Vmax:         149.00 cm/s LVOT Vmean:        89.100 cm/s LVOT VTI:          0.268 m LVOT/AV VTI ratio: 1.03  AORTA Ao Root diam: 2.80 cm Ao Asc diam:  2.80 cm MITRAL VALVE MV Area (PHT): 1.74 cm     SHUNTS MV Area VTI:   1.40 cm     Systemic VTI:  0.27 m MV Peak grad:  10.6 mmHg    Systemic Diam: 1.70 cm MV Mean grad:  3.0 mmHg MV Vmax:       1.63 m/s MV Vmean:      72.2 cm/s MV Decel Time: 435 msec MV E velocity: 92.70 cm/s MV A velocity: 152.00 cm/s MV E/A ratio:  0.61 MV A Prime:    14.9 cm/s Dwayne D Callwood MD Electronically signed by Cara JONETTA Lovelace MD Signature Date/Time: 04/18/2024/9:20:44 AM    Final    MR BRAIN WO CONTRAST Result Date: 04/17/2024 CLINICAL DATA:  Acute neurologic deficit EXAM: MRI HEAD WITHOUT CONTRAST TECHNIQUE:  Multiplanar, multiecho pulse sequences of the brain and surrounding structures were obtained without intravenous contrast. COMPARISON:  03/10/2023 FINDINGS: Brain: No acute infarct, mass effect or extra-axial collection. Unchanged chronic bilateral extra-axial collections compared to 04/17/2024 head CT, but increased since 03/10/2023 brain MRI. Chronic blood products at site of right frontal encephalomalacia. Right posterior approach shunt catheter terminating near the right foramen of Monro. Old left basal ganglia small vessel infarct. The midline structures are normal. Vascular: Normal flow voids. Skull and upper cervical spine: Normal calvarium and skull base. Visualized upper cervical spine and soft tissues are normal. Sinuses/Orbits:No paranasal sinus fluid levels or advanced mucosal thickening. No mastoid or middle ear effusion. Normal orbits. IMPRESSION: 1. No acute intracranial abnormality. 2. Unchanged chronic bilateral extra-axial collections compared to  04/17/2024 head CT, but increased since 03/10/2023 brain MRI. 3. Right posterior approach shunt catheter terminating near the right foramen of Monro. Electronically Signed   By: Franky Stanford M.D.   On: 04/17/2024 23:08   DG Chest Port 1 View Result Date: 04/17/2024 CLINICAL DATA:  Aspiration into airway. EXAM: PORTABLE CHEST 1 VIEW COMPARISON:  03/06/2024 and older studies. FINDINGS: Cardiac silhouette is normal in size. No mediastinal or hilar masses. Lung volumes are low. There is bronchovascular crowding the bases. Allowing for this, the lungs are clear. No convincing pleural effusion or pneumothorax. Stable right anterior chest wall ventriculoperitoneal shunt. Skeletal structures are grossly intact. IMPRESSION: No acute cardiopulmonary disease. Electronically Signed   By: Alm Parkins M.D.   On: 04/17/2024 12:03   CT ABDOMEN PELVIS WO CONTRAST Result Date: 04/17/2024 CLINICAL DATA:  Kidney failure, acute dehydrated. Patient states that he does  feel weak and that he hasn't been able to eat much in quite awhile. Patient is alert and oriented X4, but he is ; hypotensive in triage. He denies fevers, nausea, or vomiting. Endorses chronic back pain and shoulder pain. He says he has fallen several times lately. He is having trouble telling me if he has injured anything, and says he can't really remember. He ; denies taking blood thinners. EXAM: CT ABDOMEN AND PELVIS WITHOUT CONTRAST TECHNIQUE: Multidetector CT imaging of the abdomen and pelvis was performed following the standard protocol without IV contrast. RADIATION DOSE REDUCTION: This exam was performed according to the departmental dose-optimization program which includes automated exposure control, adjustment of the mA and/or kV according to patient size and/or use of iterative reconstruction technique. COMPARISON:  CT abdomen pelvis 12/30/2023 FINDINGS: Lower chest: Aortic valve leaflet calcification. Four-vessel coronary calcification. Hepatobiliary: No focal liver abnormality. No gallstones, gallbladder wall thickening, or pericholecystic fluid. No biliary dilatation. Pancreas: Diffusely atrophic. No focal lesion. Otherwise normal pancreatic contour. No surrounding inflammatory changes. No main pancreatic ductal dilatation. Spleen: Normal in size without focal abnormality. Adrenals/Urinary Tract: No adrenal nodule bilaterally. No nephrolithiasis and no hydronephrosis. Fluid density lesions of the kidneys likely represent simple renal cysts. Simple renal cysts, in the absence of clinically indicated signs/symptoms, require no independent follow-up. No ureterolithiasis or hydroureter. The urinary bladder is unremarkable. Stomach/Bowel: Stomach is within normal limits. No evidence of bowel wall thickening or dilatation. Status post appendectomy. Vascular/Lymphatic: No abdominal aorta or iliac aneurysm. Severe atherosclerotic plaque of the aorta and its branches. No abdominal, pelvic, or inguinal  lymphadenopathy. Reproductive: Prostate is unremarkable. Other: VP shunt terminates within the right lower abdomen. Trace simple free fluid ascites. No intraperitoneal free gas. No organized fluid collection. Musculoskeletal: Fat containing umbilical hernia. Densely sclerotic lesion of the proximal right femur and left iliac bone likely bone islands. No suspicious lytic or blastic osseous lesions. No acute displaced fracture. Multilevel degenerative changes of the spine. IMPRESSION: 1. No acute intra-abdominal or intrapelvic abnormality with limited evaluation on this noncontrast study. 2. Trace simple free fluid ascites likely due to VP shunt. 3. Aortic Atherosclerosis (ICD10-I70.0) -severe, including four-vessel coronary calcification and aortic valve leaflet calcification-correlate for aortic stenosis. Electronically Signed   By: Morgane  Naveau M.D.   On: 04/17/2024 00:44   CT Head Wo Contrast Result Date: 04/17/2024 EXAM: CT HEAD AND CERVICAL SPINE 04/17/2024 12:19:25 AM TECHNIQUE: CT of the head and cervical spine was performed without the administration of intravenous contrast. Multiplanar reformatted images are provided for review. Automated exposure control, iterative reconstruction, and/or weight based adjustment of the mA/kV was utilized  to reduce the radiation dose to as low as reasonably achievable. COMPARISON: None available. CLINICAL HISTORY: Head trauma, minor (Age >= 65y). Per ed notes; Patient arrives via EMS after family called this evening stating that they think the patient may be dehydrated. Patient states that he does feel weak and that he hasn't been able to eat much in quite awhile. Patient is alert and oriented X4, but he is hypotensive in triage. He denies fevers, nausea, or vomiting. Endorses chronic back pain and shoulder pain. He says he has fallen several times lately. He is having trouble telling me if he has injured anything, and says he can't really remember. He denies taking  blood thinners. FINDINGS: CT HEAD BRAIN AND VENTRICLES: No acute intracranial hemorrhage. No mass effect or midline shift. Gray-white differentiation is maintained. No hydrocephalus. Chronic encephalomalacia of the right frontal lobes unchanged. Bilateral low-density extra-axial collections are unchanged and likely hygromas. ORBITS: No acute abnormality. SINUSES AND MASTOIDS: No acute abnormality. SOFT TISSUES AND SKULL: No acute skull fracture. No acute soft tissue abnormality. Right parietal approach catheter with tip near the right foramina. VASCULATURE: Mild calcific atherosclerosis of the internal carotid arteries at the skull base and mild bilateral carotid bifurcation atherosclerosis. CT CERVICAL SPINE BONES AND ALIGNMENT: No acute fracture or traumatic malalignment. Mild vertebral body height loss at the C4-6 levels. DEGENERATIVE CHANGES: Right C3-4 facet hypertrophy and fusion. SOFT TISSUES: No prevertebral soft tissue swelling. IMPRESSION: 1. No acute intracranial abnormality. 2. No acute fracture or traumatic malalignment of the cervical spine. Electronically signed by: Franky Stanford MD 04/17/2024 12:24 AM EDT RP Workstation: [id number]   CT Cervical Spine Wo Contrast Result Date: 04/17/2024 EXAM: CT HEAD AND CERVICAL SPINE 04/17/2024 12:19:25 AM TECHNIQUE: CT of the head and cervical spine was performed without the administration of intravenous contrast. Multiplanar reformatted images are provided for review. Automated exposure control, iterative reconstruction, and/or weight based adjustment of the mA/kV was utilized to reduce the radiation dose to as low as reasonably achievable. COMPARISON: None available. CLINICAL HISTORY: Head trauma, minor (Age >= 65y). Per ed notes; Patient arrives via EMS after family called this evening stating that they think the patient may be dehydrated. Patient states that he does feel weak and that he hasn't been able to eat much in quite awhile. Patient is alert and  oriented X4, but he is hypotensive in triage. He denies fevers, nausea, or vomiting. Endorses chronic back pain and shoulder pain. He says he has fallen several times lately. He is having trouble telling me if he has injured anything, and says he can't really remember. He denies taking blood thinners. FINDINGS: CT HEAD BRAIN AND VENTRICLES: No acute intracranial hemorrhage. No mass effect or midline shift. Gray-white differentiation is maintained. No hydrocephalus. Chronic encephalomalacia of the right frontal lobes unchanged. Bilateral low-density extra-axial collections are unchanged and likely hygromas. ORBITS: No acute abnormality. SINUSES AND MASTOIDS: No acute abnormality. SOFT TISSUES AND SKULL: No acute skull fracture. No acute soft tissue abnormality. Right parietal approach catheter with tip near the right foramina. VASCULATURE: Mild calcific atherosclerosis of the internal carotid arteries at the skull base and mild bilateral carotid bifurcation atherosclerosis. CT CERVICAL SPINE BONES AND ALIGNMENT: No acute fracture or traumatic malalignment. Mild vertebral body height loss at the C4-6 levels. DEGENERATIVE CHANGES: Right C3-4 facet hypertrophy and fusion. SOFT TISSUES: No prevertebral soft tissue swelling. IMPRESSION: 1. No acute intracranial abnormality. 2. No acute fracture or traumatic malalignment of the cervical spine. Electronically signed by: Franky Stanford MD 04/17/2024  12:24 AM EDT RP Workstation: [id number]    Microbiology: Results for orders placed or performed during the hospital encounter of 05/07/24  Blood Culture (routine x 2)     Status: None   Collection Time: 05/07/24  2:38 PM   Specimen: BLOOD  Result Value Ref Range Status   Specimen Description BLOOD BLOOD RIGHT FOREARM  Final   Special Requests   Final    BOTTLES DRAWN AEROBIC AND ANAEROBIC Blood Culture results may not be optimal due to an inadequate volume of blood received in culture bottles   Culture   Final    NO  GROWTH 5 DAYS Performed at [facility], [address] [place], [place] [address]    Report Status 05/12/2024 FINAL  Final  Blood Culture (routine x 2)     Status: None   Collection Time: 05/07/24  3:02 PM   Specimen: BLOOD LEFT ARM  Result Value Ref Range Status   Specimen Description BLOOD LEFT ARM  Final   Special Requests   Final    BOTTLES DRAWN AEROBIC AND ANAEROBIC Blood Culture results may not be optimal due to an inadequate volume of blood received in culture bottles   Culture   Final    NO GROWTH 5 DAYS Performed at [facility], [address] [place], [place] [address]    Report Status 05/12/2024 FINAL  Final  MRSA Next Gen by PCR, Nasal     Status: None   Collection Time: 05/07/24  4:17 PM   Specimen: Nasal Mucosa; Nasal Swab  Result Value Ref Range Status   MRSA by PCR Next Gen NOT DETECTED NOT DETECTED Final    Comment: (NOTE) The GeneXpert MRSA Assay (FDA approved for NASAL specimens only), is one component of a comprehensive MRSA colonization surveillance program. It is not intended to diagnose MRSA infection nor to guide or monitor treatment for MRSA infections. Test performance is not FDA approved in patients less than 79 years old. Performed at [facility], [address] [place], [place] [address]   Resp panel by RT-PCR (RSV, Flu A&B, Covid) Anterior Nasal Swab     Status: None   Collection Time: 05/07/24  8:40 PM   Specimen: Anterior Nasal Swab  Result Value Ref Range Status   SARS Coronavirus 2 by RT PCR NEGATIVE NEGATIVE Final    Comment: (NOTE) SARS-CoV-2 target nucleic acids are NOT DETECTED.  The SARS-CoV-2 RNA is generally detectable in upper respiratory specimens during the acute phase of infection. The lowest concentration of SARS-CoV-2 viral copies this assay can detect is 138 copies/mL. A negative result does not preclude SARS-Cov-2 infection and should not be used as the sole basis for treatment or other  patient management decisions. A negative result may occur with  improper specimen collection/handling, submission of specimen other than nasopharyngeal swab, presence of viral mutation(s) within the areas targeted by this assay, and inadequate number of viral copies(<138 copies/mL). A negative result must be combined with clinical observations, patient history, and epidemiological information. The expected result is Negative.  Fact Sheet for Patients:  BloggerCourse.com  Fact Sheet for Healthcare Providers:  SeriousBroker.it  This test is no t yet approved or cleared by the [place]  FDA and  has been authorized for detection and/or diagnosis of SARS-CoV-2 by FDA under an Emergency Use Authorization (EUA). This EUA will remain  in effect (meaning this test can be used) for the duration of the COVID-19 declaration under Section 564(b)(1) of the Act, 21 U.S.C.section  360bbb-3(b)(1), unless the authorization is terminated  or revoked sooner.       Influenza A by PCR NEGATIVE NEGATIVE Final   Influenza B by PCR NEGATIVE NEGATIVE Final    Comment: (NOTE) The Xpert Xpress SARS-CoV-2/FLU/RSV plus assay is intended as an aid in the diagnosis of influenza from Nasopharyngeal swab specimens and should not be used as a sole basis for treatment. Nasal washings and aspirates are unacceptable for Xpert Xpress SARS-CoV-2/FLU/RSV testing.  Fact Sheet for Patients: BloggerCourse.com  Fact Sheet for Healthcare Providers: SeriousBroker.it  This test is not yet approved or cleared by the [place]  FDA and has been authorized for detection and/or diagnosis of SARS-CoV-2 by FDA under an Emergency Use Authorization (EUA). This EUA will remain in effect (meaning this test can be used) for the duration of the COVID-19 declaration under Section 564(b)(1) of the Act, 21 U.S.C. section  360bbb-3(b)(1), unless the authorization is terminated or revoked.     Resp Syncytial Virus by PCR NEGATIVE NEGATIVE Final    Comment: (NOTE) Fact Sheet for Patients: BloggerCourse.com  Fact Sheet for Healthcare Providers: SeriousBroker.it  This test is not yet approved or cleared by the [place]  FDA and has been authorized for detection and/or diagnosis of SARS-CoV-2 by FDA under an Emergency Use Authorization (EUA). This EUA will remain in effect (meaning this test can be used) for the duration of the COVID-19 declaration under Section 564(b)(1) of the Act, 21 U.S.C. section 360bbb-3(b)(1), unless the authorization is terminated or revoked.  Performed at [facility], [address] [place], [place] [address]   Respiratory (~20 pathogens) panel by PCR     Status: None   Collection Time: 05/07/24  8:40 PM   Specimen: Nasopharyngeal Swab; Respiratory  Result Value Ref Range Status   Adenovirus NOT DETECTED NOT DETECTED Final   Coronavirus 229E NOT DETECTED NOT DETECTED Final    Comment: (NOTE) The Coronavirus on the Respiratory Panel, DOES NOT test for the novel  Coronavirus (2019 nCoV)    Coronavirus HKU1 NOT DETECTED NOT DETECTED Final   Coronavirus NL63 NOT DETECTED NOT DETECTED Final   Coronavirus OC43 NOT DETECTED NOT DETECTED Final   Metapneumovirus NOT DETECTED NOT DETECTED Final   Rhinovirus / Enterovirus NOT DETECTED NOT DETECTED Final   Influenza A NOT DETECTED NOT DETECTED Final   Influenza B NOT DETECTED NOT DETECTED Final   Parainfluenza Virus 1 NOT DETECTED NOT DETECTED Final   Parainfluenza Virus 2 NOT DETECTED NOT DETECTED Final   Parainfluenza Virus 3 NOT DETECTED NOT DETECTED Final   Parainfluenza Virus 4 NOT DETECTED NOT DETECTED Final   Respiratory Syncytial Virus NOT DETECTED NOT DETECTED Final   Bordetella pertussis NOT DETECTED NOT DETECTED Final   Bordetella Parapertussis NOT  DETECTED NOT DETECTED Final   Chlamydophila pneumoniae NOT DETECTED NOT DETECTED Final   Mycoplasma pneumoniae NOT DETECTED NOT DETECTED Final    Comment: Performed at [facility], [address] [place], [place] [address]    Labs: CBC: Recent Labs  Lab 05/10/24 0318 05/11/24 0515 05/12/24 0353 05/13/24 0422  WBC 5.8 4.6 3.3* 3.4*  HGB 7.9* 7.7* 8.3* 8.3*  HCT 24.1* 24.2* 26.3* 25.6*  MCV 98.8 102.5* 101.9* 101.2*  PLT 54* 41* 34* 28*   Basic Metabolic Panel: Recent Labs  Lab 05/09/24 0404 05/10/24 0707 05/11/24 0515 05/12/24 0353 05/13/24 0422  NA 141 142 146* 149* 149*  K 3.8 3.4* 3.4* 3.4* 3.5  CL 105 110 116* 114* 116*  CO2  23 24 25 25 25   GLUCOSE 129* 140* 163* 153* 141*  BUN 50* 65* 80* 84* 77*  CREATININE 2.26* 2.18* 2.23* 2.03* 1.77*  CALCIUM  7.8* 7.9* 8.3* 8.3* 8.1*  MG 1.6* 2.4 2.7* 3.0* 2.8*  PHOS  --   --  3.5 4.0 3.7   Liver Function Tests: Recent Labs  Lab 05/13/24 0422  ALBUMIN 1.6*   CBG: Recent Labs  Lab 05/12/24 1928 05/12/24 2304 05/13/24 0258 05/13/24 0750 05/13/24 1153  GLUCAP 130* 127* 123* 120* 107*    Discharge time spent: greater than 30 minutes.  Signed: AIDA CHO, MD [facility] 05/15/2024

## 2024-05-15 NOTE — Progress Notes (Signed)
 [facility] LIAISON NOTE  Received request from [place], Transitions of Care [facility]), for evaluation for hospice InPatient Unit [facility]).  Spoke with patient's daughter, [name], via telephone to initiate education related to hospice philosophy, services, and team approach to care. Patient/family verbalized understanding of information given. Per discussion, the plan is for discharge to the [facility] today for symptom management of uncontrolled pain and secretions.   [facility] RN to call report to the Hospice Home at [phone]  This RN will arrange EMS transportation when the hospital team is ready for discharge.  Please medicate the patient prior to EMS transport as needed for comfort during transport.  Please send signed and completed DNR home with patient/family if applicable.   [facility] information and contact numbers given to  Kaitlyn Lynn.   Above information shared with [place], [facility],  and hospital medical care team.  Please call with any hospice related questions or concerns.  Thank you for the opportunity to participate in this patient's care.  Saddie HILARIO Na, MA, BSN, RN, FNE Nurse Liaison [phone]

## 2024-05-15 NOTE — Plan of Care (Signed)
  Problem: Fluid Volume: Goal: Hemodynamic stability will improve Outcome: Adequate for Discharge   Problem: Clinical Measurements: Goal: Diagnostic test results will improve Outcome: Adequate for Discharge Goal: Signs and symptoms of infection will decrease Outcome: Adequate for Discharge   Problem: Respiratory: Goal: Ability to maintain adequate ventilation will improve Outcome: Adequate for Discharge   Problem: Education: Goal: Ability to describe self-care measures that may prevent or decrease complications (Diabetes Survival Skills Education) will improve Outcome: Adequate for Discharge   Problem: Coping: Goal: Ability to adjust to condition or change in health will improve Outcome: Adequate for Discharge   Problem: Fluid Volume: Goal: Ability to maintain a balanced intake and output will improve Outcome: Adequate for Discharge   Problem: Health Behavior/Discharge Planning: Goal: Ability to identify and utilize available resources and services will improve Outcome: Adequate for Discharge Goal: Ability to manage health-related needs will improve Outcome: Adequate for Discharge   Problem: Metabolic: Goal: Ability to maintain appropriate glucose levels will improve Outcome: Adequate for Discharge   Problem: Nutritional: Goal: Maintenance of adequate nutrition will improve Outcome: Adequate for Discharge Goal: Progress toward achieving an optimal weight will improve Outcome: Adequate for Discharge   Problem: Skin Integrity: Goal: Risk for impaired skin integrity will decrease Outcome: Adequate for Discharge   Problem: Tissue Perfusion: Goal: Adequacy of tissue perfusion will improve Outcome: Adequate for Discharge   Problem: Education: Goal: Knowledge of General Education information will improve Description: Including pain rating scale, medication(s)/side effects and non-pharmacologic comfort measures Outcome: Adequate for Discharge   Problem: Health  Behavior/Discharge Planning: Goal: Ability to manage health-related needs will improve Outcome: Adequate for Discharge   Problem: Clinical Measurements: Goal: Ability to maintain clinical measurements within normal limits will improve Outcome: Adequate for Discharge Goal: Will remain free from infection Outcome: Adequate for Discharge Goal: Diagnostic test results will improve Outcome: Adequate for Discharge Goal: Respiratory complications will improve Outcome: Adequate for Discharge Goal: Cardiovascular complication will be avoided Outcome: Adequate for Discharge   Problem: Activity: Goal: Risk for activity intolerance will decrease Outcome: Adequate for Discharge   Problem: Nutrition: Goal: Adequate nutrition will be maintained Outcome: Adequate for Discharge   Problem: Coping: Goal: Level of anxiety will decrease Outcome: Adequate for Discharge   Problem: Elimination: Goal: Will not experience complications related to bowel motility Outcome: Adequate for Discharge Goal: Will not experience complications related to urinary retention Outcome: Adequate for Discharge   Problem: Pain Managment: Goal: General experience of comfort will improve and/or be controlled Outcome: Adequate for Discharge   Problem: Safety: Goal: Ability to remain free from injury will improve Outcome: Adequate for Discharge   Problem: Skin Integrity: Goal: Risk for impaired skin integrity will decrease Outcome: Adequate for Discharge   Problem: Education: Goal: Knowledge of the prescribed therapeutic regimen will improve Outcome: Adequate for Discharge   Problem: Coping: Goal: Ability to identify and develop effective coping behavior will improve Outcome: Adequate for Discharge   Problem: Clinical Measurements: Goal: Quality of life will improve Outcome: Adequate for Discharge   Problem: Respiratory: Goal: Verbalizations of increased ease of respirations will increase Outcome: Adequate  for Discharge   Problem: Role Relationship: Goal: Family's ability to cope with current situation will improve Outcome: Adequate for Discharge Goal: Ability to verbalize concerns, feelings, and thoughts to partner or family member will improve Outcome: Adequate for Discharge   Problem: Pain Management: Goal: Satisfaction with pain management regimen will improve Outcome: Adequate for Discharge

## 2024-05-15 NOTE — Progress Notes (Signed)
 Progress Note    Albert Figueroa  FMW:[id number] DOB: 10-27-1955  DOA: 05/07/2024 PCP: Alla Amis, MD      Brief Narrative:    Medical records reviewed and are as summarized below:  Albert Figueroa is a 68 y.o. 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    On chart review, patient was recently discharged from the hospital on 04/28/24 following admission for AMS and multifactorial shock iso suspected polypharmacy, adrenal insufficiency vs autonomic dysfunction. Per ED reports, patient's home health nurse called EMS for altered mental status. On EMS arrival, patient was lethargic and able to answer only yes or no questions. Initial EMS BP 80/50, temp 101.5 axillary, HR 110, 96O2 on RA. 1000mg  tylenol  IV, 500NS bolus given in route.     Significant Hospital Events   05/07/24:Admitted to [facility] service with sepsis and acute toxic metabolic encephalopathy 2/2 pneumonia, developed recurrent hypotension requiring levophed  gtt. PCCM consulted  8/9 remains on biPAP, severe encephalopathy  >  FiO2 (%):  [28 %] 28 %   8/10 remains on pressors 05/10/24-  CVP 9 this am, hypotensive in shock on pressors support via central line, confusion + tried to remove foley but despite this has improved UOP, on supplemental O2 1-2L/min, had 3 bradyarrythmic episodes without symptoms, his electrolytes were repleted. Im concerned about him receiving cefepime  with encephalopathy and seizure disorder we will refine therapy today, same with atarax  TID with dementia. We will initiate PT/OT if he can tolerate it 05/11/24- overnight worsening encephalopathy possible aspiration event currently on 5L/min.  We are removing foley today.  Palliative is meeting with family for goals of care.  05/12/24- patient remains  critically ill, encephalopathic , howerver is off BIPAP and off vasopressors now. He remains with multi organ dysfunction, open skin wounds.  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 Family is working with palliative care towards possible comfort measures currently DNR     Care transferred to [facility] on 05/13/2024    Assessment/Plan:   Principal Problem:   Severe sepsis with acute organ dysfunction [facility]) Active Problems:   Tobacco use   Type 2 diabetes mellitus with renal complication (HCC)   Hyperlipidemia, unspecified   Type 2 diabetes mellitus with chronic kidney disease, without long-term current use of insulin  (HCC)   Seizure disorder (HCC)   Malnutrition of moderate degree   Nutrition Problem: Moderate Malnutrition Etiology: chronic illness  Signs/Symptoms: moderate fat depletion, moderate muscle depletion   Body mass index is 27.43 kg/m.    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 Continue comfort care. Consult hospice team to assist with disposition.     Diet Order             Diet regular Room service appropriate? Yes; Fluid consistency: Thin  Diet effective now  Consultants: Intensivist Palliative care  Procedures: Left IJ central line    Medications:    budesonide -glycopyrrolate -formoterol   2 puff Inhalation BID   levETIRAcetam   500 mg Intravenous Q12H   Continuous Infusions:     Anti-infectives (From admission, onward)    Start     Dose/Rate Route  Frequency Ordered Stop   05/11/24 1000  cefTRIAXone  (ROCEPHIN ) 2 g in sodium chloride  0.9 % 100 mL IVPB  Status:  Discontinued        2 g 200 mL/hr over 30 Minutes Intravenous Every 24 hours 05/11/24 0748 05/13/24 1341   05/10/24 1700  cefUROXime  (CEFTIN ) tablet 500 mg  Status:  Discontinued        500 mg Oral 2 times daily with meals 05/10/24 1135 05/11/24 0748   05/10/24 1300  azithromycin  (ZITHROMAX ) tablet 500 mg        500 mg Oral Daily 05/10/24 1136 05/10/24 1332   05/09/24 1000  ceFEPIme  (MAXIPIME ) 2 g in sodium chloride  0.9 % 100 mL IVPB  Status:  Discontinued        2 g 200 mL/hr over 30 Minutes Intravenous Every 12 hours 05/09/24 0702 05/10/24 1013   05/08/24 1000  ceFEPIme  (MAXIPIME ) 2 g in sodium chloride  0.9 % 100 mL IVPB  Status:  Discontinued        2 g 200 mL/hr over 30 Minutes Intravenous Every 24 hours 05/08/24 0823 05/09/24 0702   05/07/24 1615  cefTRIAXone  (ROCEPHIN ) 2 g in sodium chloride  0.9 % 100 mL IVPB  Status:  Discontinued        2 g 200 mL/hr over 30 Minutes Intravenous Every 24 hours 05/07/24 1612 05/08/24 0812   05/07/24 1615  azithromycin  (ZITHROMAX ) 500 mg in sodium chloride  0.9 % 250 mL IVPB  Status:  Discontinued        500 mg 250 mL/hr over 60 Minutes Intravenous Every 24 hours 05/07/24 1612 05/10/24 1136   05/07/24 1613  vancomycin  variable dose per unstable renal function (pharmacist dosing)  Status:  Discontinued         Does not apply See admin instructions 05/07/24 1613 05/09/24 0859   05/07/24 1600  vancomycin  (VANCOREADY) IVPB 1750 mg/350 mL        1,750 mg 175 mL/hr over 120 Minutes Intravenous  Once 05/07/24 1531 05/08/24 0853   05/07/24 1530  piperacillin -tazobactam (ZOSYN ) IVPB 3.375 g  Status:  Discontinued        3.375 g 12.5 mL/hr over 240 Minutes Intravenous  Once 05/07/24 1523 05/08/24 0855              Family Communication/Anticipated D/C date and plan/Code Status   DVT prophylaxis:      Code Status: Do not attempt  resuscitation (DNR) - Comfort care  Family Communication: None Disposition Plan: Possible discharge to hospice   Status is: Inpatient Remains inpatient appropriate because: Sepsis from pneumonia, respiratory failure       Subjective:   Interval events noted.  He is more alert today.  He said I feel like crap..  Objective:    Vitals:   05/13/24 1703 05/14/24 0902 05/14/24 2000 05/14/24 2152  BP: 131/64 127/78 96/62 112/71  Pulse: 61 69 74 79  Resp: 16 17 16    Temp: (!) 97.5 F (36.4 C) 97.6 F (36.4 C) 98 F (36.7 C) 97.6 F (36.4 C)  TempSrc:   Axillary   SpO2: 96% 99% 98% 97%  Weight:      Height:  No data found.   Intake/Output Summary (Last 24 hours) at 05/15/2024 1425 Last data filed at 05/15/2024 0900 Gross per 24 hour  Intake 128.4 ml  Output 500 ml  Net -371.6 ml   Filed Weights   05/07/24 1430 05/12/24 0345 05/13/24 0420  Weight: 75.7 kg 87.5 kg 89.2 kg    Exam:  GEN: NAD SKIN: Warm and dry EYES: No pallor or icterus ENT: MMM CV: RRR PULM: CTA B ABD: soft, ND, NT, +BS CNS: AAO x x 1 (person), confused EXT: Edema of bilateral upper and lower extremities        Data Reviewed:   I have personally reviewed following labs and imaging studies:  Labs: Labs show the following:   Basic Metabolic Panel: Recent Labs  Lab 05/09/24 0404 05/10/24 0707 05/11/24 0515 05/12/24 0353 05/13/24 0422  NA 141 142 146* 149* 149*  K 3.8 3.4* 3.4* 3.4* 3.5  CL 105 110 116* 114* 116*  CO2 23 24 25 25 25   GLUCOSE 129* 140* 163* 153* 141*  BUN 50* 65* 80* 84* 77*  CREATININE 2.26* 2.18* 2.23* 2.03* 1.77*  CALCIUM  7.8* 7.9* 8.3* 8.3* 8.1*  MG 1.6* 2.4 2.7* 3.0* 2.8*  PHOS  --   --  3.5 4.0 3.7   GFR Estimated Creatinine Clearance: 43.1 mL/min (A) (by C-G formula based on SCr of 1.77 mg/dL (H)). Liver Function Tests: Recent Labs  Lab 05/13/24 0422  ALBUMIN 1.6*   No results for input(s): LIPASE, AMYLASE in the last 168  hours.  No results for input(s): AMMONIA in the last 168 hours.  Coagulation profile No results for input(s): INR, PROTIME in the last 168 hours.   CBC: Recent Labs  Lab 05/10/24 0318 05/11/24 0515 05/12/24 0353 05/13/24 0422  WBC 5.8 4.6 3.3* 3.4*  HGB 7.9* 7.7* 8.3* 8.3*  HCT 24.1* 24.2* 26.3* 25.6*  MCV 98.8 102.5* 101.9* 101.2*  PLT 54* 41* 34* 28*   Cardiac Enzymes: No results for input(s): CKTOTAL, CKMB, CKMBINDEX, TROPONINI in the last 168 hours. BNP (last 3 results) No results for input(s): PROBNP in the last 8760 hours. CBG: Recent Labs  Lab 05/12/24 1928 05/12/24 2304 05/13/24 0258 05/13/24 0750 05/13/24 1153  GLUCAP 130* 127* 123* 120* 107*   D-Dimer: No results for input(s): DDIMER in the last 72 hours. Hgb A1c: No results for input(s): HGBA1C in the last 72 hours. Lipid Profile: No results for input(s): CHOL, HDL, LDLCALC, TRIG, CHOLHDL, LDLDIRECT in the last 72 hours. Thyroid  function studies: No results for input(s): TSH, T4TOTAL, T3FREE, THYROIDAB in the last 72 hours.  Invalid input(s): FREET3 Anemia work up: No results for input(s): VITAMINB12, FOLATE, FERRITIN, TIBC, IRON, RETICCTPCT in the last 72 hours. Sepsis Labs: Recent Labs  Lab 05/09/24 0407 05/10/24 0318 05/11/24 0515 05/12/24 0353 05/13/24 0422  PROCALCITON 37.16  --   --   --   --   WBC  --  5.8 4.6 3.3* 3.4*    Microbiology Recent Results (from the past 240 hours)  Blood Culture (routine x 2)     Status: None   Collection Time: 05/07/24  2:38 PM   Specimen: BLOOD  Result Value Ref Range Status   Specimen Description BLOOD BLOOD RIGHT FOREARM  Final   Special Requests   Final    BOTTLES DRAWN AEROBIC AND ANAEROBIC Blood Culture results may not be optimal due to an inadequate volume of blood received in culture bottles   Culture   Final    NO GROWTH  5 DAYS Performed at [facility], [address]  [place], [place] [address]    Report Status 05/12/2024 FINAL  Final  Blood Culture (routine x 2)     Status: None   Collection Time: 05/07/24  3:02 PM   Specimen: BLOOD LEFT ARM  Result Value Ref Range Status   Specimen Description BLOOD LEFT ARM  Final   Special Requests   Final    BOTTLES DRAWN AEROBIC AND ANAEROBIC Blood Culture results may not be optimal due to an inadequate volume of blood received in culture bottles   Culture   Final    NO GROWTH 5 DAYS Performed at [facility], [address], [place], [place] [address]    Report Status 05/12/2024 FINAL  Final  MRSA Next Gen by PCR, Nasal     Status: None   Collection Time: 05/07/24  4:17 PM   Specimen: Nasal Mucosa; Nasal Swab  Result Value Ref Range Status   MRSA by PCR Next Gen NOT DETECTED NOT DETECTED Final    Comment: (NOTE) The GeneXpert MRSA Assay (FDA approved for NASAL specimens only), is one component of a comprehensive MRSA colonization surveillance program. It is not intended to diagnose MRSA infection nor to guide or monitor treatment for MRSA infections. Test performance is not FDA approved in patients less than 17 years old. Performed at [facility], [address] [place], [place] [address]   Resp panel by RT-PCR (RSV, Flu A&B, Covid) Anterior Nasal Swab     Status: None   Collection Time: 05/07/24  8:40 PM   Specimen: Anterior Nasal Swab  Result Value Ref Range Status   SARS Coronavirus 2 by RT PCR NEGATIVE NEGATIVE Final    Comment: (NOTE) SARS-CoV-2 target nucleic acids are NOT DETECTED.  The SARS-CoV-2 RNA is generally detectable in upper respiratory specimens during the acute phase of infection. The lowest concentration of SARS-CoV-2 viral copies this assay can detect is 138 copies/mL. A negative result does not preclude SARS-Cov-2 infection and should not be used as the sole basis for treatment or other patient management decisions. A negative result may occur with  improper  specimen collection/handling, submission of specimen other than nasopharyngeal swab, presence of viral mutation(s) within the areas targeted by this assay, and inadequate number of viral copies(<138 copies/mL). A negative result must be combined with clinical observations, patient history, and epidemiological information. The expected result is Negative.  Fact Sheet for Patients:  BloggerCourse.com  Fact Sheet for Healthcare Providers:  SeriousBroker.it  This test is no t yet approved or cleared by the [place]  FDA and  has been authorized for detection and/or diagnosis of SARS-CoV-2 by FDA under an Emergency Use Authorization (EUA). This EUA will remain  in effect (meaning this test can be used) for the duration of the COVID-19 declaration under Section 564(b)(1) of the Act, 21 U.S.C.section 360bbb-3(b)(1), unless the authorization is terminated  or revoked sooner.       Influenza A by PCR NEGATIVE NEGATIVE Final   Influenza B by PCR NEGATIVE NEGATIVE Final    Comment: (NOTE) The Xpert Xpress SARS-CoV-2/FLU/RSV plus assay is intended as an aid in the diagnosis of influenza from Nasopharyngeal swab specimens and should not be used as a sole basis for treatment. Nasal washings and aspirates are unacceptable for Xpert Xpress SARS-CoV-2/FLU/RSV testing.  Fact Sheet for Patients: BloggerCourse.com  Fact Sheet for Healthcare Providers: SeriousBroker.it  This test is not yet approved or cleared by the [place]  FDA  and has been authorized for detection and/or diagnosis of SARS-CoV-2 by FDA under an Emergency Use Authorization (EUA). This EUA will remain in effect (meaning this test can be used) for the duration of the COVID-19 declaration under Section 564(b)(1) of the Act, 21 U.S.C. section 360bbb-3(b)(1), unless the authorization is terminated or revoked.     Resp  Syncytial Virus by PCR NEGATIVE NEGATIVE Final    Comment: (NOTE) Fact Sheet for Patients: BloggerCourse.com  Fact Sheet for Healthcare Providers: SeriousBroker.it  This test is not yet approved or cleared by the [place]  FDA and has been authorized for detection and/or diagnosis of SARS-CoV-2 by FDA under an Emergency Use Authorization (EUA). This EUA will remain in effect (meaning this test can be used) for the duration of the COVID-19 declaration under Section 564(b)(1) of the Act, 21 U.S.C. section 360bbb-3(b)(1), unless the authorization is terminated or revoked.  Performed at [facility], [address] [place], [place] [address]   Respiratory (~20 pathogens) panel by PCR     Status: None   Collection Time: 05/07/24  8:40 PM   Specimen: Nasopharyngeal Swab; Respiratory  Result Value Ref Range Status   Adenovirus NOT DETECTED NOT DETECTED Final   Coronavirus 229E NOT DETECTED NOT DETECTED Final    Comment: (NOTE) The Coronavirus on the Respiratory Panel, DOES NOT test for the novel  Coronavirus (2019 nCoV)    Coronavirus HKU1 NOT DETECTED NOT DETECTED Final   Coronavirus NL63 NOT DETECTED NOT DETECTED Final   Coronavirus OC43 NOT DETECTED NOT DETECTED Final   Metapneumovirus NOT DETECTED NOT DETECTED Final   Rhinovirus / Enterovirus NOT DETECTED NOT DETECTED Final   Influenza A NOT DETECTED NOT DETECTED Final   Influenza B NOT DETECTED NOT DETECTED Final   Parainfluenza Virus 1 NOT DETECTED NOT DETECTED Final   Parainfluenza Virus 2 NOT DETECTED NOT DETECTED Final   Parainfluenza Virus 3 NOT DETECTED NOT DETECTED Final   Parainfluenza Virus 4 NOT DETECTED NOT DETECTED Final   Respiratory Syncytial Virus NOT DETECTED NOT DETECTED Final   Bordetella pertussis NOT DETECTED NOT DETECTED Final   Bordetella Parapertussis NOT DETECTED NOT DETECTED Final   Chlamydophila pneumoniae NOT DETECTED NOT DETECTED Final    Mycoplasma pneumoniae NOT DETECTED NOT DETECTED Final    Comment: Performed at [facility], [address], [place], [place] [address]    Procedures and diagnostic studies:  No results found.             LOS: 8 days   Daleyza Gadomski  [facility]   Pager on [email or web address]. If 7PM-7AM, please contact night-coverage at [email or web address]     05/15/2024, 2:25 PM

## 2024-05-15 NOTE — Progress Notes (Signed)
 Daily Progress Note   Patient Name: Albert Figueroa      Date: 05/15/2024 DOB: November 11, 1955  Age: 68 y.o. MRN#: [id number] Attending Physician: Jens Durand, MD Primary Care Physician: Alla Amis, MD Admit Date: 05/07/2024  Reason for Consultation/Follow-up: Establishing goals of care  HPI/Brief Hospital Review: 68 y.o. 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  Reportedly found by his home health nurse altered which prompted EMS to be called.  On arrival to the ED, Albert Figueroa was lethargic.  Found to have sepsis and acute toxic metabolic encephalopathy secondary to significant right sided pneumonia, became hypoxic.  Requiring BiPAP and vasopressor support.   Noted 6 inpatient admissions within the last 6 months   Last discharge 7/30, admission due to altered mental status and multifactorial shock, possible polypharmacy as well as adrenal insufficiency versus autonomic dysfunction   Possible aspiration event 8/12 with worsening encephalopathy and increased oxygen needs   Transitioned to [facility] 8/14   Palliative medicine was consulted for assisting with goals of care conversations.  Subjective: Extensive chart review has been completed prior to meeting patient including labs, vital signs, imaging, progress notes, orders, and available advanced directive documents from current and previous encounters.    Visited with Albert Figueroa at his bedside. This morning Albert Figueroa is able to open eyes, converse although remains confused-able to recognize and call his daughter by name.  Emotional support  provided to daughter, we again discussed comfort measures and EOL expectations. [name] is thankful Albert Figueroa is able to communicate with her this morning. We again briefly discussed overall philosophy of hospice, [name] expresses interest in learning more and feels as though hospice services will best benefit Albert Figueroa at this time.  Returned to bedside mid morning, Albert Figueroa awake, engaging in conversation with daughter but remains confused, daughter shares Albert Figueroa was able to finish his carton of milk without difficulty.   Returned to bedside later in day, Albert Figueroa less awake and coherent, speech difficult to understand, skin pale and dusky-called and spoke with [name], she expresses interest in possible IPU transfer if appropriate. Engaged with TOC , primary team and TOC.  Has  been approved for IPU and daughter has accepted bed. Plan for transfer later this evening.  Thank you for allowing the Palliative Medicine Team to assist in the care of this patient.  Total time:  50 minutes  Time spent includes: Detailed review of medical records (labs, imaging, vital signs), medically appropriate exam (mental status, respiratory, cardiac, skin), discussed with treatment team, counseling and educating patient, family and staff, documenting clinical information, medication management and coordination of care.  Albert Lesches, DNP, AGNP-C Palliative Medicine   Please contact Palliative Medicine Team phone at [phone] for questions and concerns.

## 2024-05-15 NOTE — Plan of Care (Signed)
 Pt awake gradually talking while sleeping when given care; pt confused, q2 turns, oral care, eye drops given; robinul  for secretion and keppra  to prevent seizures given; daughter at bedside and more calm now hearing her dad talking. Problem: Respiratory: Goal: Ability to maintain adequate ventilation will improve 05/15/2024 0247 by Melven Odilia PARAS, RN Outcome: Progressing 05/15/2024 0247 by Melven Odilia PARAS, RN Outcome: Progressing   Problem: Tissue Perfusion: Goal: Adequacy of tissue perfusion will improve 05/15/2024 0247 by Melven Odilia PARAS, RN Outcome: Progressing 05/15/2024 0247 by Melven Odilia PARAS, RN Outcome: Progressing   Problem: Clinical Measurements: Goal: Ability to maintain clinical measurements within normal limits will improve 05/15/2024 0247 by Melven Odilia PARAS, RN Outcome: Progressing 05/15/2024 0247 by Melven Odilia PARAS, RN Outcome: Progressing   Problem: Elimination: Goal: Will not experience complications related to bowel motility 05/15/2024 0247 by Melven Odilia PARAS, RN Outcome: Progressing 05/15/2024 0247 by Melven Odilia PARAS, RN Outcome: Progressing   Problem: Pain Managment: Goal: General experience of comfort will improve and/or be controlled 05/15/2024 0247 by Melven Odilia PARAS, RN Outcome: Progressing 05/15/2024 0247 by Melven Odilia PARAS, RN Outcome: Progressing

## 2024-05-15 NOTE — TOC Progression Note (Signed)
 Transition of Care [facility]) - Progression Note    Patient Details  Name: FUQUAN WILSON MRN: [id number] Date of Birth: 12/22/1955  Transition of [facility]) CM/SW Contact  [place], [place] Phone Number: 05/15/2024, 3:25 PM  Clinical Narrative:     CSW spoke with Ms. Macario, daughter. Ms. Macario confirmed that she wanted [facility] to speak with her family regarding hospice services.   CSW advised Saddie to proceed with the referral.     Barriers to Discharge: Continued Medical Work up               Expected Discharge Plan and Services       Living arrangements for the past 2 months: Skilled Nursing Facility, Apartment                             [facility] Agency: [employer] [facility]         Social Drivers of Health (SDOH) Interventions SDOH Screenings   Food Insecurity: Patient Unable To Answer (05/08/2024)  Housing: Unknown (05/08/2024)  Transportation Needs: Patient Unable To Answer (05/08/2024)  Utilities: Patient Unable To Answer (05/08/2024)  Financial Resource Strain: High Risk (10/06/2023)   Received from [facility]  Social Connections: Patient Unable To Answer (05/08/2024)  Tobacco Use: High Risk (05/07/2024)    Readmission Risk Interventions    05/10/2024   10:59 AM 04/28/2024   12:08 PM 12/30/2023   12:01 PM  Readmission Risk Prevention Plan  Transportation Screening Complete Complete Complete  PCP or Specialist Appt within 3-5 Days   Complete  Social Work Consult for Recovery Care Planning/Counseling   Complete  Palliative Care Screening   Not Applicable  Medication Review Oceanographer) Complete Complete Complete  PCP or Specialist appointment within 3-5 days of discharge Complete Not Complete   PCP/Specialist Appt Not Complete comments  Pt. to be followed up by SNF   SW Recovery Care/Counseling Consult Complete    Skilled Nursing Facility  Complete

## 2024-05-15 NOTE — TOC Transition Note (Signed)
 Transition of Care [facility]) - Discharge Note   Patient Details  Name: Albert Figueroa MRN: [id number] Date of Birth: April 20, 1956  [facility]) CM/SW Contact:  [place] L Sekai Nayak, LCSW Phone Number: 05/15/2024, 4:03 PM   Clinical Narrative:     CSW advised that daughter accepted [facility]PU at the hospice home. Transportation scheduled for patient with [facility]. Med necessity form completed.   No further TOC needs. TOC signing off.     Barriers to Discharge: Continued Medical Work up   Patient Goals and CMS Choice            Discharge Placement                       Discharge Plan and Services Additional resources added to the After Visit Summary for                              [facility] Agency: [facility]        Social Drivers of Health (SDOH) Interventions SDOH Screenings   Food Insecurity: Patient Unable To Answer (05/08/2024)  Housing: Unknown (05/08/2024)  Transportation Needs: Patient Unable To Answer (05/08/2024)  Utilities: Patient Unable To Answer (05/08/2024)  Financial Resource Strain: High Risk (10/06/2023)   Received from [facility]  Social Connections: Patient Unable To Answer (05/08/2024)  Tobacco Use: High Risk (05/07/2024)     Readmission Risk Interventions    05/10/2024   10:59 AM 04/28/2024   12:08 PM 12/30/2023   12:01 PM  Readmission Risk Prevention Plan  Transportation Screening Complete Complete Complete  PCP or Specialist Appt within 3-5 Days   Complete  Social Work Consult for Recovery Care Planning/Counseling   Complete  Palliative Care Screening   Not Applicable  Medication Review [employer]) Complete Complete Complete  PCP or Specialist appointment within 3-5 days of discharge Complete Not Complete   PCP/Specialist Appt Not Complete comments  Pt. to be followed up by SNF   SW Recovery Care/Counseling Consult Complete    Skilled Nursing Facility  Complete

## 2024-05-31 DEATH — deceased
